# Patient Record
Sex: Female | Born: 1985 | Race: Black or African American | Hispanic: No | Marital: Single | Smoking: Never smoker
Health system: Southern US, Community
[De-identification: ages and names within clinical notes are randomized; demographics above are authoritative.]

## PROBLEM LIST (undated history)

## (undated) ENCOUNTER — Inpatient Hospital Stay (HOSPITAL_COMMUNITY): Payer: Self-pay

## (undated) DIAGNOSIS — I10 Essential (primary) hypertension: Secondary | ICD-10-CM

## (undated) DIAGNOSIS — J189 Pneumonia, unspecified organism: Secondary | ICD-10-CM

## (undated) DIAGNOSIS — N186 End stage renal disease: Secondary | ICD-10-CM

## (undated) DIAGNOSIS — Z992 Dependence on renal dialysis: Secondary | ICD-10-CM

## (undated) DIAGNOSIS — Z940 Kidney transplant status: Secondary | ICD-10-CM

## (undated) DIAGNOSIS — K259 Gastric ulcer, unspecified as acute or chronic, without hemorrhage or perforation: Secondary | ICD-10-CM

## (undated) DIAGNOSIS — N189 Chronic kidney disease, unspecified: Secondary | ICD-10-CM

## (undated) HISTORY — DX: Pneumonia, unspecified organism: J18.9

## (undated) HISTORY — DX: Gastric ulcer, unspecified as acute or chronic, without hemorrhage or perforation: K25.9

---
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
## 2006-08-21 DIAGNOSIS — R809 Proteinuria, unspecified: Secondary | ICD-10-CM | POA: Insufficient documentation

## 2007-08-25 ENCOUNTER — Inpatient Hospital Stay (HOSPITAL_COMMUNITY): Admission: EM | Admit: 2007-08-25 | Discharge: 2007-09-09 | Payer: Self-pay | Admitting: Emergency Medicine

## 2007-08-25 ENCOUNTER — Ambulatory Visit: Payer: Self-pay | Admitting: Cardiology

## 2007-08-25 ENCOUNTER — Ambulatory Visit: Payer: Self-pay | Admitting: Internal Medicine

## 2007-08-25 ENCOUNTER — Ambulatory Visit: Payer: Self-pay | Admitting: Oncology

## 2007-08-26 ENCOUNTER — Encounter (INDEPENDENT_AMBULATORY_CARE_PROVIDER_SITE_OTHER): Payer: Self-pay | Admitting: Internal Medicine

## 2007-08-31 ENCOUNTER — Encounter (INDEPENDENT_AMBULATORY_CARE_PROVIDER_SITE_OTHER): Payer: Self-pay | Admitting: Internal Medicine

## 2007-09-01 ENCOUNTER — Ambulatory Visit: Payer: Self-pay | Admitting: Vascular Surgery

## 2007-10-01 ENCOUNTER — Ambulatory Visit: Payer: Self-pay | Admitting: Infectious Disease

## 2007-10-01 ENCOUNTER — Encounter (INDEPENDENT_AMBULATORY_CARE_PROVIDER_SITE_OTHER): Payer: Self-pay | Admitting: Internal Medicine

## 2007-10-01 DIAGNOSIS — N186 End stage renal disease: Secondary | ICD-10-CM

## 2007-10-01 DIAGNOSIS — N189 Chronic kidney disease, unspecified: Secondary | ICD-10-CM

## 2007-10-01 DIAGNOSIS — R8789 Other abnormal findings in specimens from female genital organs: Secondary | ICD-10-CM | POA: Insufficient documentation

## 2007-10-01 DIAGNOSIS — D631 Anemia in chronic kidney disease: Secondary | ICD-10-CM

## 2007-10-01 DIAGNOSIS — D696 Thrombocytopenia, unspecified: Secondary | ICD-10-CM

## 2007-10-01 DIAGNOSIS — Z992 Dependence on renal dialysis: Secondary | ICD-10-CM

## 2007-10-01 LAB — CONVERTED CEMR LAB
BUN: 14 mg/dL (ref 6–23)
CO2: 31 meq/L (ref 19–32)
Calcium: 11.1 mg/dL — ABNORMAL HIGH (ref 8.4–10.5)
Chlamydia, DNA Probe: NEGATIVE
Chloride: 94 meq/L — ABNORMAL LOW (ref 96–112)
Creatinine, Ser: 5.86 mg/dL — ABNORMAL HIGH (ref 0.40–1.20)
GC Probe Amp, Genital: NEGATIVE
Glucose, Bld: 67 mg/dL — ABNORMAL LOW (ref 70–99)
HCT: 42.2 % (ref 36.0–46.0)
Hemoglobin: 13.7 g/dL (ref 12.0–15.0)
MCHC: 32.6 g/dL (ref 30.0–36.0)
MCV: 99 fL (ref 78.0–100.0)
Platelets: 273 10*3/uL (ref 150–400)
Potassium: 3.7 meq/L (ref 3.5–5.3)
RBC: 4.26 M/uL (ref 3.87–5.11)
RDW: 18.6 % — ABNORMAL HIGH (ref 11.5–15.5)
Sodium: 138 meq/L (ref 135–145)
WBC: 8.5 10*3/uL (ref 4.0–10.5)

## 2007-10-02 ENCOUNTER — Encounter (INDEPENDENT_AMBULATORY_CARE_PROVIDER_SITE_OTHER): Payer: Self-pay | Admitting: Internal Medicine

## 2007-10-05 LAB — CONVERTED CEMR LAB
Candida species: NEGATIVE
Gardnerella vaginalis: POSITIVE — AB
Trichomonal Vaginitis: NEGATIVE

## 2007-10-14 ENCOUNTER — Encounter (INDEPENDENT_AMBULATORY_CARE_PROVIDER_SITE_OTHER): Payer: Self-pay | Admitting: Internal Medicine

## 2007-11-19 ENCOUNTER — Ambulatory Visit: Payer: Self-pay | Admitting: *Deleted

## 2007-12-15 ENCOUNTER — Ambulatory Visit (HOSPITAL_COMMUNITY): Admission: RE | Admit: 2007-12-15 | Discharge: 2007-12-15 | Payer: Self-pay | Admitting: Nephrology

## 2008-07-07 ENCOUNTER — Ambulatory Visit (HOSPITAL_COMMUNITY): Admission: RE | Admit: 2008-07-07 | Discharge: 2008-07-07 | Payer: Self-pay | Admitting: Nephrology

## 2009-10-13 ENCOUNTER — Ambulatory Visit (HOSPITAL_COMMUNITY): Admission: RE | Admit: 2009-10-13 | Discharge: 2009-10-13 | Payer: Self-pay | Admitting: Nephrology

## 2009-11-22 ENCOUNTER — Ambulatory Visit: Payer: Self-pay | Admitting: Vascular Surgery

## 2009-11-30 ENCOUNTER — Ambulatory Visit (HOSPITAL_COMMUNITY): Admission: RE | Admit: 2009-11-30 | Discharge: 2009-11-30 | Payer: Self-pay | Admitting: Vascular Surgery

## 2009-11-30 ENCOUNTER — Ambulatory Visit: Payer: Self-pay | Admitting: Vascular Surgery

## 2009-12-12 ENCOUNTER — Ambulatory Visit: Payer: Self-pay | Admitting: Vascular Surgery

## 2010-01-18 ENCOUNTER — Emergency Department (HOSPITAL_COMMUNITY): Admission: EM | Admit: 2010-01-18 | Discharge: 2010-01-19 | Payer: Self-pay | Admitting: Emergency Medicine

## 2010-01-24 ENCOUNTER — Ambulatory Visit: Payer: Self-pay | Admitting: Vascular Surgery

## 2010-01-26 ENCOUNTER — Ambulatory Visit (HOSPITAL_COMMUNITY): Admission: RE | Admit: 2010-01-26 | Discharge: 2010-01-26 | Payer: Self-pay | Admitting: Nephrology

## 2010-01-30 ENCOUNTER — Ambulatory Visit: Payer: Self-pay | Admitting: Vascular Surgery

## 2010-01-30 ENCOUNTER — Ambulatory Visit (HOSPITAL_COMMUNITY): Admission: RE | Admit: 2010-01-30 | Discharge: 2010-01-30 | Payer: Self-pay | Admitting: Vascular Surgery

## 2010-02-15 ENCOUNTER — Ambulatory Visit: Payer: Self-pay | Admitting: Vascular Surgery

## 2010-03-13 ENCOUNTER — Emergency Department (HOSPITAL_COMMUNITY): Admission: EM | Admit: 2010-03-13 | Discharge: 2010-03-13 | Payer: Self-pay | Admitting: Emergency Medicine

## 2010-03-20 ENCOUNTER — Ambulatory Visit: Payer: Self-pay | Admitting: Vascular Surgery

## 2010-03-20 ENCOUNTER — Ambulatory Visit (HOSPITAL_COMMUNITY): Admission: RE | Admit: 2010-03-20 | Discharge: 2010-03-20 | Payer: Self-pay | Admitting: Vascular Surgery

## 2010-05-29 ENCOUNTER — Emergency Department (HOSPITAL_COMMUNITY)
Admission: EM | Admit: 2010-05-29 | Discharge: 2010-05-29 | Payer: Self-pay | Source: Home / Self Care | Admitting: Emergency Medicine

## 2010-06-21 ENCOUNTER — Emergency Department (HOSPITAL_COMMUNITY)
Admission: EM | Admit: 2010-06-21 | Discharge: 2010-06-21 | Payer: Self-pay | Source: Home / Self Care | Admitting: Emergency Medicine

## 2010-06-24 DIAGNOSIS — Z940 Kidney transplant status: Secondary | ICD-10-CM

## 2010-06-24 HISTORY — PX: KIDNEY TRANSPLANT: SHX239

## 2010-06-24 HISTORY — DX: Kidney transplant status: Z94.0

## 2010-07-02 ENCOUNTER — Emergency Department (HOSPITAL_COMMUNITY)
Admission: EM | Admit: 2010-07-02 | Discharge: 2010-07-02 | Payer: Self-pay | Source: Home / Self Care | Admitting: Family Medicine

## 2010-07-09 LAB — POCT PREGNANCY, URINE: Preg Test, Ur: NEGATIVE

## 2010-07-15 ENCOUNTER — Encounter: Payer: Self-pay | Admitting: Nephrology

## 2010-07-19 ENCOUNTER — Ambulatory Visit (HOSPITAL_COMMUNITY)
Admission: RE | Admit: 2010-07-19 | Discharge: 2010-07-19 | Payer: Self-pay | Source: Home / Self Care | Attending: Nephrology | Admitting: Nephrology

## 2010-08-02 ENCOUNTER — Ambulatory Visit (INDEPENDENT_AMBULATORY_CARE_PROVIDER_SITE_OTHER): Payer: Medicaid Other

## 2010-08-02 DIAGNOSIS — N186 End stage renal disease: Secondary | ICD-10-CM

## 2010-08-02 DIAGNOSIS — T82898A Other specified complication of vascular prosthetic devices, implants and grafts, initial encounter: Secondary | ICD-10-CM

## 2010-08-03 NOTE — H&P (Signed)
HISTORY AND PHYSICAL EXAMINATION  August 02, 2010  Re:  Cook, Kerry L                  DOB:  1985-08-01  CHIEF COMPLAINT:  Competing branch, left Cimino fistula.  HISTORY OF PRESENT ILLNESS:  Patient is a 25 year old woman who had a left radiocephalic fistula placed on 06/09/__________ by Dr. Oneida Alar. The have been using this fistula, but it was found that she had decreased flow.  She was sent for evaluation after a fistulogram.  The fistulogram, done on 07/19/2010 showed diminished flow in the fistula with a large competing branch at the wrist, and she was sent for evaluation of ligation of this branch.  PAST MEDICAL HISTORY:  Unchanged.  She has hypertension, end-stage renal disease, and thrombocytopenia.  SOCIAL HISTORY:  She is a Ship broker in early childhood education.  REVIEW OF SYSTEMS:  A 10-point review of systems was reviewed and was negative for chest pain, shortness of breath, numbness, tingling or pain in her left hand.  Positive for chronic kidney disease.  PHYSICAL EXAMINATION:  Vital Signs: She is 5 feet 2 and 118 pounds.  Her heart rate was 86, and her saturations were 100%.  Respiratory rate was 10.  Her lungs were clear without wheezes, rales or rhonchi.  Heart: Rate and rhythm was regular without murmur or rub.  HEENT:  Grossly within normal limits.  She had no ulcers or skin rashes.  Left upper extremity:  She had a large competing branch noted over the wrist with increased flow in her Cimino fistula when this branch was compressed, and this was confirmed with Doppler flow.  She has a 2+ radial pulse distal to the fistula.  Her hand was warm and pink.  She had good motion and sensation in the left hand.  MEDICATIONS:  Include PhosLo 667, two with meals and 1 with snack, lisinopril 10 mg at bedtime, Renagel 800 mg 2 tablets t.i.d. with meals, Norvasc 10 mg at bedtime, Zocor 40 mg daily, and Nephro-Vite 1 tablet daily.  She dialyzes at  the [facility] on Monday, Wednesday, and Friday.  ASSESSMENT/PLAN:  Competing branch in the left wrist off the left Cimino arteriovenous fistula.  Plan is to do a ligation of the competing branch on 08/21/2010.  The patient consents and was given instructions regarding this outpatient procedure.  Kerry Kearns, PA-C  Charles E. Fields, MD Electronically Signed  RR/MEDQ  D:  08/02/2010  T:  08/02/2010  Job:  [id number]

## 2010-08-20 ENCOUNTER — Encounter (HOSPITAL_COMMUNITY)
Admission: RE | Admit: 2010-08-20 | Discharge: 2010-08-20 | Disposition: A | Discharge status: Home / Self Care | Payer: Medicare Other | Source: Ambulatory Visit | Attending: Vascular Surgery | Admitting: Vascular Surgery

## 2010-08-20 DIAGNOSIS — Z01812 Encounter for preprocedural laboratory examination: Secondary | ICD-10-CM | POA: Insufficient documentation

## 2010-08-20 LAB — SURGICAL PCR SCREEN
MRSA, PCR: NEGATIVE
Staphylococcus aureus: NEGATIVE

## 2010-08-20 LAB — POCT I-STAT 4, (NA,K, GLUC, HGB,HCT)
Glucose, Bld: 76 mg/dL (ref 70–99)
HCT: 33 % — ABNORMAL LOW (ref 36.0–46.0)
Hemoglobin: 11.2 g/dL — ABNORMAL LOW (ref 12.0–15.0)
Potassium: 3.7 mEq/L (ref 3.5–5.1)
Sodium: 140 mEq/L (ref 135–145)

## 2010-08-21 ENCOUNTER — Ambulatory Visit (HOSPITAL_COMMUNITY)
Admission: RE | Admit: 2010-08-21 | Discharge: 2010-08-21 | Disposition: A | Discharge status: Home / Self Care | Payer: Medicare Other | Source: Ambulatory Visit | Attending: Vascular Surgery | Admitting: Vascular Surgery

## 2010-08-21 DIAGNOSIS — I120 Hypertensive chronic kidney disease with stage 5 chronic kidney disease or end stage renal disease: Secondary | ICD-10-CM | POA: Insufficient documentation

## 2010-08-21 DIAGNOSIS — N186 End stage renal disease: Secondary | ICD-10-CM

## 2010-08-21 DIAGNOSIS — T82598A Other mechanical complication of other cardiac and vascular devices and implants, initial encounter: Secondary | ICD-10-CM | POA: Insufficient documentation

## 2010-08-21 DIAGNOSIS — Z992 Dependence on renal dialysis: Secondary | ICD-10-CM | POA: Insufficient documentation

## 2010-08-21 DIAGNOSIS — T82898A Other specified complication of vascular prosthetic devices, implants and grafts, initial encounter: Secondary | ICD-10-CM

## 2010-08-21 DIAGNOSIS — Z01812 Encounter for preprocedural laboratory examination: Secondary | ICD-10-CM | POA: Insufficient documentation

## 2010-08-21 DIAGNOSIS — Y832 Surgical operation with anastomosis, bypass or graft as the cause of abnormal reaction of the patient, or of later complication, without mention of misadventure at the time of the procedure: Secondary | ICD-10-CM | POA: Insufficient documentation

## 2010-08-27 NOTE — Op Note (Signed)
  NAME:  Mikkelsen, Anaisha            ACCOUNT NO.:  [id number]  MEDICAL RECORD NO.:  [id number]           PATIENT TYPE:  O  LOCATION:  SDSC                         FACILITY:  [facility]  PHYSICIAN:  Charles E. Fields, MD  DATE OF BIRTH:  1985/09/26  DATE OF PROCEDURE:  08/21/2010 DATE OF DISCHARGE:  08/21/2010                              OPERATIVE REPORT   PROCEDURE:  Ligation of multiple side branches, left arm arteriovenous fistula.  PREOPERATIVE DIAGNOSIS:  Non-maturing arteriovenous fistula, left arm.  POSTOPERATIVE DIAGNOSIS:  Non-maturing arteriovenous fistula, left arm.  ANESTHESIA:  Local with IV sedation.  ASSISTANT:  Nurse.  OPERATIVE FINDINGS:  Side branches ligated x2.  OPERATIVE DETAILS:  After obtaining informed consent, the patient was taken to the operating room.  The patient was placed in supine position on the operating table.  After adequate sedation, the patient's entire left upper extremity was prepped and draped in usual sterile fashion. Local anesthesia was infiltrated over a very large proximal side branch off of the left radiocephalic AV fistula.  The incision was made approximately 2 cm higher than the original incision for the fistula. Incision was carried down through the subcutaneous tissues down to level of a side branch, which was approximately 1.5 mm in diameter.  This was dissected free circumferentially and then ligated with 2-0 silk ties. There was an additional side branch seen just above this on the skin surface.  Therefore, local anesthesia was also infiltrated over this, and an additional longitudinal incision was made over the superficial vein approximately 2 cm higher than the first incision.  Incision was carried down through the subcutaneous tissues down to level of an additional side branch.  This was also about 1.25-1.5 mm in diameter. This was dissected free circumferentially and ligated with 2-0 silk ties.  There was still an easily  palpable thrill in the fistula at this point.  Both wounds were irrigated with normal saline solution.  The skin edges were then reapproximated of both incisions using a 4-0 Vicryl subcuticular stitch and Dermabond applied to the incisions.  The patient tolerated the procedure well and there were no complications. Instrument, sponge, and needle counts were correct at the end of the case.  The patient was taken to recovery room in stable condition.     Jessy Oto. Fields, MD     CEF/MEDQ  D:  08/21/2010  T:  08/22/2010  Job:  [id number]  Electronically Signed by Ruta Hinds MD on 08/27/2010 11:02:43 AM

## 2010-09-04 LAB — URINE CULTURE: Culture  Setup Time: 201112061936

## 2010-09-04 LAB — URINALYSIS, ROUTINE W REFLEX MICROSCOPIC
Bilirubin Urine: NEGATIVE
Glucose, UA: 100 mg/dL — AB
Ketones, ur: NEGATIVE mg/dL
Leukocytes, UA: NEGATIVE
Nitrite: NEGATIVE
Protein, ur: 100 mg/dL — AB
Specific Gravity, Urine: 1.015 (ref 1.005–1.030)
Urobilinogen, UA: 0.2 mg/dL (ref 0.0–1.0)
pH: 8.5 — ABNORMAL HIGH (ref 5.0–8.0)

## 2010-09-04 LAB — URINE MICROSCOPIC-ADD ON

## 2010-09-04 LAB — HEMOCCULT GUIAC POC 1CARD (OFFICE): Fecal Occult Bld: NEGATIVE

## 2010-09-04 LAB — POCT I-STAT, CHEM 8
BUN: 23 mg/dL (ref 6–23)
Calcium, Ion: 1.08 mmol/L — ABNORMAL LOW (ref 1.12–1.32)
Chloride: 96 mEq/L (ref 96–112)
Glucose, Bld: 84 mg/dL (ref 70–99)

## 2010-09-04 LAB — MAGNESIUM: Magnesium: 3.2 mg/dL — ABNORMAL HIGH (ref 1.5–2.5)

## 2010-09-07 LAB — POCT I-STAT 4, (NA,K, GLUC, HGB,HCT)
Glucose, Bld: 88 mg/dL (ref 70–99)
Potassium: 3.7 mEq/L (ref 3.5–5.1)

## 2010-09-07 LAB — SURGICAL PCR SCREEN: Staphylococcus aureus: NEGATIVE

## 2010-09-10 LAB — POCT I-STAT 4, (NA,K, GLUC, HGB,HCT): Glucose, Bld: 85 mg/dL (ref 70–99)

## 2010-09-11 ENCOUNTER — Ambulatory Visit (INDEPENDENT_AMBULATORY_CARE_PROVIDER_SITE_OTHER): Payer: Medicare Other | Admitting: Vascular Surgery

## 2010-09-11 DIAGNOSIS — N186 End stage renal disease: Secondary | ICD-10-CM

## 2010-09-11 LAB — POTASSIUM: Potassium: 4.3 mEq/L (ref 3.5–5.1)

## 2010-09-12 NOTE — Assessment & Plan Note (Signed)
OFFICE VISIT  Cook, Kerry L DOB:  23-Jul-1985                                       09/11/2010 [id number]  Patient has a functioning left forearm AV fistula (Cimino) which was revised by ligation of the 3 competing branches by Dr. Oneida Alar recently. This was performed on February 28.  She states that she occasionally has a sharp pain that shoots down into the left hand.  She denies numbness in the hand or weakness in the hand.  PHYSICAL EXAMINATION:  On examination, the fistula is functioning nicely, and the incisions are healing well.  A few Vicryl suture knots were excised, and there is an excellent pulse and palpable thrill in the fistula with no evidence of steal or ischemia of the left hand.  She was reassured regarding these findings and will come to see [place] on a p.r.n. basis.    Kerry Cook, M.D. Electronically Signed  JDL/MEDQ  D:  09/11/2010  T:  09/12/2010  Job:  [id number]

## 2010-11-06 NOTE — Assessment & Plan Note (Signed)
OFFICE VISIT   Cook, Kerry L  DOB:  Feb 06, 1986                                       12/12/2009  [id number]   CHIEF COMPLAINT:  Follow-up left Cimino AV fistula.   Patient is a 25 year old woman who has been on hemodialysis and had a  left Cimino fistula placed on 11/30/2009 by Dr. Oneida Alar.  She has been  doing well.  She has had no signs of steal, no numbness or tingling in  her hand.  No pain in the hand.  She is able to use the hand normally.  She is dialyzing through a right IJ perm cath.   PHYSICAL EXAMINATION:  This is a thin, well-developed, well-nourished  young woman in no acute distress.  Vital signs:  Heart rate 72,  saturations are 100%.  Respiratory rate is 10.  The left arm Cimino  fistula, the wound is healing well.  There was some protuberant suture  which was clipped at the proximal end of the wound, otherwise was  healing well.  She had a positive radial pulse.  Her hand was warm and  pink with good motion and sensation.  She had a good thrill and bruit in  the AV fistula, and the vein was easily palpable in the forearm.   ASSESSMENT:  Maturing, easily palpable left Cimino fistula.   PLAN:  Follow up in 6 weeks with Dr. Oneida Alar to check maturation of the  fistula.   Wray Kearns, PA-C   Kerry Cook. Kellie Simmering, M.D.  Electronically Signed   RR/MEDQ  D:  12/12/2009  T:  12/12/2009  Job:  [id number]

## 2010-11-06 NOTE — Assessment & Plan Note (Signed)
OFFICE VISIT   Gentles, Tarrie L  DOB:  09-30-1985                                       02/15/2010  [id number]   The patient returns for followup today.  She had excision of an  aneurysmal degenerated thrombosed nonfunctional AV fistula in her right  arm.   On exam today blood pressure is 129/84 in the left arm, heart rate is 98  and regular.  Temperature is 97.8.  She has an easily palpable thrill  and audible bruit in the left radiocephalic AV fistula.  Right upper  extremity arm incisions are all well-healed.  She has a 2+ radial pulse  on the right side.   She will follow up on an as-needed basis if she needs any further  maintenance work on her left arm AV fistula.     Jessy Oto. Fields, MD  Electronically Signed   CEF/MEDQ  D:  02/15/2010  T:  02/16/2010  Job:  [id number]

## 2010-11-06 NOTE — Discharge Summary (Signed)
NAME:  Kerry Cook, Kerry Cook            ACCOUNT NO.:  [id number]   MEDICAL RECORD NO.:  [id number]          PATIENT TYPE:  INP   LOCATION:  [id number]                         FACILITY:  [facility]   PHYSICIAN:  Kerry Cook, M.D.  DATE OF BIRTH:  1985-07-21   DATE OF ADMISSION:  08/25/2007  DATE OF DISCHARGE:  09/09/2007                               DISCHARGE SUMMARY   DISCHARGE DIAGNOSES:  1. Hypertensive emergency.  2. Endstage renal disease, FSGN, collapsing variant.  3. 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  4. Thrombocytopenia.  5. Anemia.  6. Proteinuria.  7. History of medical noncompliance with medications, secondary to      financial issues.  8. Metabolic acidosis.   DISCHARGE MEDICATIONS:  1. Labetalol 200 mg 1 tablet by mouth twice a day..  2. Norvasc 10 mg 1 tablet by mouth daily.  3. Aspirin 81 mg 1 tablet by mouth daily.  4. Nephro-Vite 1 tablet by mouth daily.  5. Calcium carbonate 500 mg 2 tablets by mouth 3 times daily with      food.  6. Hectorol 4 mcg IV 3 times per week with dialysis.  7. Epogen 5000 units 3 times per week with dialysis.   DISPOSITION AND FOLLOWUP:  The patient was discharged in a stable  condition with followup in the outpatient clinic with Kerry Cook on October 01, 2007, at 1:30 p.m.  During this appointment, it is  going to be important to recheck a renal function panel and also to  check specifically the blood pressure just to be sure that the patient  is compliant with her medications and that the blood pressure is very  well controlled.  The patient was instructed to bring her medications  bottle, so will be worth it  for the doctor to review the medication  bottles for accuracy regarding doses and meds.  The patient was also  instructed to follow a renal diet, and she is going to receive  hemodialysis as an outpatient on days; Mondays, Wednesdays, and Fridays  at [facility].   CONSULTATIONS MADE DURING  THIS HOSPITALIZATION:  Hematology/oncologist  and Nephrology were consulted   PROCEDURES PERFORMED DURING THIS ADMISSION:  The patient had an AV  fistula created on the right upper arm by Kerry Cook.  She had a CT  of the head without contrast that also demonstrated no abnormality  without any evidence of acute intracranial hemorrhage, infarct, mass  effect, midline shift, or abnormal extra-axial fluid collection.  There  was no hydrocephalus.  The patient had a renal ultrasound that shows  markedly echogenic kidneys bilaterally with no corticomedullary  differentiation.  Findings are consistent with renal medical disease  that those appeared to be some excretion of urine as indicated by the  urethal junction in the bladder.  The patient also had an ultrasound-  guided right IJ tunnel for hemodialysis catheter insertion with  demonstrated fluoroscopically right IJ tunnel hemodialysis catheter  without the tips in the right subclavian vein in the SVC/RA junction  ready for use, 19-cm tip to cuff HemoSplit catheter in place.  The  patient also had another chest x-ray on September 05, 2007, showing mild  cardiomegaly with a stable right IJ dialysis catheter.  Lungs clear, no  effusion.  A 2-D echo demonstrated left ventricular hypertrophy with EF  of 65%.2-D echo that demonstrated left ventricular hypertrophy wall  thickening, mildly increased demonstrating left ventricular hypertrophy.  There was no left ventricular regional wall motion abnormality, ejection  fraction estimated to be 65%.  There was mild mitral valvular  regurgitation.  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   HISTORY OF PRESENT ILLNESS, PHYSICAL EXAMINATION, AND LABS AT THE MOMENT  OF ADMISSION:  Kerry Cook is a 25 year old African American woman with  past medical history of  FSGN and hypertension diagnosed 6 years ago, who  came to the emergency department with blurred vision.  The patient  reported that she had been without medications for her blood pressure  for the last 6 months.  She denies headache, chest pain, nausea,  vomiting, or abdominal pain.  The patient is not allergic to any drug  and found to have a past medical history of hypertension plus renal  failure, secondary to focal segmental glomerulonephrosis.   PHYSICAL EXAMINATION:  VITAL SIGNS:  At the moment of admission,  temperature was 97.8, blood pressure was 294/174, heart rate was 103,  respiratory rate 18, and oxygen saturation 97% on room air.  GENERAL:  The patient was alert, in no acute distress, lying on bed.  EYES:  Anicteric.  PERRLA.  Extraocular muscles intact.  ENT:  Pink pharynx.  No exudates.  No plaques.  Good dentition.  NECK:  No bruits.  No thyromegaly.  RESPIRATORY SYSTEM:  Clear to auscultation bilaterally.  CARDIOVASCULAR:  Tachycardia of regular rhythm.  No murmurs, gallops, or  rub.  GI:  Soft, nontender, and nondistended.  Positive bowel sounds.  EXTREMITIES:  No edema.  No cyanosis.  No clubbing.  SKIN:  Mild dryness present but no lesions or rash.  There was no  lymphadenopathy.  NEUROLOGIC:  The patient was alert, awake, and oriented x3.  Complaining  of blurred vision, improved after blood pressure decreased with  medications the patient received Klonopin by mouth at emergency  department, and then she received 20 mg IV of labetalol.  Intracranial  nerves II through XII.  Appropriate affect.   LABORATORY DATA:  At admission, labs demonstrated a sodium of 133,  potassium 4.4, chloride 106, bicarbonate 18.8 with anion gap of 8, BUN  58, creatinine 12.1, glucose 85, bilirubin 0.8, alkaline phosphatase 55,  AST 26, ALT 14, protein 6.6, albumin 3.3, and calcium 9.0.  White blood  cell 7.8 with an ANC of 6.1, hemoglobin 7.1 with a hematocrit of 21,  platelets 60  with an MCV of 94.  CT of the head no acute abnormality.  The urinalysis was cloudy-appearing with a large amount of hemoglobin  and more than 300 protein, many bacteria, red blood cells 0 to 2 per  field.  Chest x-ray read no acute cardiopulmonary disease present.  Cardiac enzymes:  Total CK 244, troponin 0.16, and CK-MB 2.6.   HOSPITAL COURSE BY PROBLEMS:  1. Hypertensive emergency secondary to medical noncopliance with      target organ damage demonstrated by kidneys with proteinuria,      hematuria, and the central nervous system with presentation of      blurred vision.  The patient was admitted to telemetry secondary to      tachycardia plus PVC's . IV Labetolol was started for blood      pressure control.  This was eventually changed to po and Norvasc      was also added.  Blood pressure was controlled on this medicine      regimen as well as dialysis. The patient is going to follow up in      the outpatient clinic on October 01, 2007 for blood pressure check. 2D      ECHO was performed with results as noted above.  2. Anemia, multifactorial. On admission, the patient's LDH was      elevated at 460 and the haptoglobin was low, indicating hemolysis      though there were no schistocytes on the peripheral smear. The      morning after admission, the hemoglobin dropped to 6.4 and the      patient was transfused 2 units of blood.  Hematology was consulted.      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  The patient was therefore given a trial of Decadron 10 mg      IV daily with daily followup of CBC and LDH.  On the steriods and      with control of her blood pressure, the patient's LDH normalized      and her hemoglobin remained stable around 9.0,  even after steroids      were tapered.  Hemoglobin at discharge was 9.2  and LDH was normal      at 213. Since ESRD was also contributing to the patient's anemia,      she was started on iron and Aranesp.  3. Thrombocytopenia. As above, hematology felt this was most likely      due to malignant hypertension vs a microangiopathic process.      Platelets normalized with control of blood pressure and steroids      and remained in the normal range with tapering of the steroids.      Platelet count at discharge was 203,000.  4. Renal insufficiency, secondary to focal segmental      glomerulonephrosis.  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 The patient was      prepared for hemodialysis including education.  The patient      received a AV fistula on the right upper arm, and she also had a      catheter placed in the right subclavian vein in junction with the      right carotid artery.  The patient was started on hemodialysis.      She responded well  without any complications.  At discharge,      creatinine was 6.9 with a BUN of 56.  The patient was scheduled to      have hemodialysis as an outpatient on Monday, Wednesday, and      Friday. She was discharged with a followup in the outpatient clinic      on October 01, 2007, and Monday, Wednesday, and Friday hemodialysis.      The facility where she is going to receive the hemodialysis is      [facility].  5. Metabolic acidosis with anion gap, secondary to renal failure.      Corrected with dialysis  6. Leukocytosis, most likely secondary to the use of steroids.  As      soon as we started tapering down the doses of the steroids, the      patient was having a resolution of this leukocytosis.  We will      continue checking the CBC, but there was a complete workup done in      order to rule out any infection, and everything was negative.      Clear chest x-ray.  The urinalysis was also negative for infection,      and blood culture x2 that was  also negative.  The patient was      without fever and completely asymptomatic.  7. Constipation.  She developed constipation after receiving morphine      in order to calm the pain after she had the AV fistula  placed on      her right upper arm and the catheter on her subclavian vein.  She      received Colace and MiraLax, and this problem was resolved.  8. Secondary hyperparathyroidism, secondary to renal failure.  She was      using Hectorol, and she was also using calcium carbonate in order      to help with this problem.  At discharge, the patient's temperature was 98.0, blood pressure was  140/80, heart rate 63, respiratory rate 20 with 99% oxygen saturation on  room air.   Sodium of 133, potassium 4.3, chloride 101, bicarbonate 23, BUN 56,  creatinine 6.9, and glucose 81.  We have a hemoglobin of 9.2, platelets  203, MCV 97.7, MVH 213 with white blood cells of 17.5.      Julieta Bellini, MD  Electronically Signed      Kerry Cook, M.D.  Electronically Signed    CEM/MEDQ  D:  09/30/2007  T:  09/30/2007  Job:  [id number]   cc:   Philemon Cook, M.D.

## 2010-11-06 NOTE — Consult Note (Signed)
NAME:  Kerry Cook, Kerry Cook            ACCOUNT NO.:  [id number]   MEDICAL RECORD NO.:  [id number]          PATIENT TYPE:  INP   LOCATION:  [id number]                         FACILITY:  [facility]   PHYSICIAN:  Donato Heinz, M.D.DATE OF BIRTH:  [birth year]   DATE OF CONSULTATION:  DATE OF DISCHARGE:                                 CONSULTATION   REASON FOR CONSULTATION:  Markedly elevated serum creatinine on  admission.   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  The patient endorses a  diagnosis of hypertension since approximately age 78 with sporadic  followup most recently with a nephrologist at [facility], Dr. Dwana Melena.  The patient reports that her last  visit with Dr. Thornell Sartorius was approximately one year prior to admission and  that her last dose of antihypertensive medications was taken about six  months prior to admission.  This is reportedly due to financial  difficulties in purchasing these medications.  Per old records, the  patient's creatinine was approximately 1.9 in 2007, increased to 2.3 in  2008, and has not been checked since then, given her lack of visit to a  nephrologist in one year.  Of note, the patient denies any other  constitutional symptoms, including nausea, vomiting, chest pain,  abdominal pain, diarrhea, fevers or chills.  She does endorse occasional  headaches, which she has had for at least five years, and intermittent  blurry vision, worse in the several days prior to admission.  Regarding  her emergency department  course, the patient was initially noted to have  a blood pressure of 260/164 with a creatinine of 12.1.  She was thus  treated for malignant hypertension and admitted for workup of her renal  failure.  We are consulted given her history of renal disease and her  markedly elevated creatinine on admission.   ADMISSION MEDICATIONS:  Patient denies taking any medications at home.  In the past, she has been prescribed medications for blood pressure,  including 25 mg daily of hydrochlorothiazide, 100 mg twice daily of  labetalol, and 20 mg daily of enalapril, but again has not taken these  medications for at least six months.   CURRENT MEDICATIONS:  As of the time of this dictation include:  1. Norvasc 10 mg by mouth daily.  2. Aspirin 81 mg by mouth daily.  3. Os-Cal 500 mg by mouth twice daily.  4. Labetalol 200 mg by mouth twice daily.  5. Protonix 40 mg by mouth daily.  6. Nephro-Vite 1 tablet by mouth daily.  7. Tylenol 650 mg by mouth every 4 hours as needed.   PHYSICAL EXAMINATION:  VITAL SIGNS:  Temperature 98.1 degrees  Fahrenheit, blood pressure 127/77, pulse 80, respiration rate 20, oxygen  saturation 97% on room air.  GENERAL:  Awake, alert and oriented x3 in no acute distress.  A young  African-American female.  HEENT:  Pupils are equal, round and reactive to light and accommodation.  Extraocular movements are intact.  Sclerae are anicteric.  Moist mucous  membranes.  Fair dentition.  NECK:  Supple without JVD or carotid bruits.  CARDIOVASCULAR:  Regular rate and rhythm without murmurs, rubs or  gallops.  PULMONARY:  Clear to auscultation bilaterally without wheezes, rales or  rhonchi.  ABDOMEN:  Soft, nontender, nondistended with normoactive bowel sounds.  GENITOURINARY:  Bilateral mild-to-moderate CVA tenderness.  EXTREMITIES:  No clubbing, cyanosis or edema.   LABORATORY STUDIES:  Recent studies remarkable for sodium 134, potassium  3.7, chloride 102, bicarbonate 22,  BUN 68, creatinine 11.84, glucose 86,  calcium 8.8, albumin 2.8, phosphorus 8.2.  Magnesium is normal at 2.4.  White blood cell count 7.2, hemoglobin 9.1, hematocrit 26.2, platelet  count 75, MCV 92.9, RDW 14.  A urine pregnancy test has been done and is  negative.  A screen for drugs of abuse was negative.  LDH is high at  460, and haptoglobin is low at less than 6.  C3 and C4 complement levels  are normal.  HIV antibody negative.  Antinuclear antibody negative.  ANCA pending.  Acute hepatitis panel pending.  Anti-parathyroid hormone  level pending.  Urinalysis on August 26, 2007 revealed specific gravity  1.018 with a pH of 6, greater than 300 protein, otherwise negative.  Urine microscopy at that time revealed a few squamous epithelial cells,  3-6 white blood cells per high-powered field, 3-6 red blood cells per  high-powered field, a few bacteria, and amorphous urates.  Microalbumin  and creatinine ratio markedly elevated at 2247.3 mg/gm.   IMPRESSION:  1. Acute-on-chronic renal failure in the setting of known renal      disease (collapsing variant focal segmental glomerulosclerosis).  2. Malignant hypertension.  3. Anemia.  4. Thrombocytopenia.  5. Probable secondary hyperparathyroidism.  6. Need for vascular access.   RECOMMENDATIONS:  1. Worsening renal failure, acute-on-chronic.  Past biopsy in 2004      with documented collapsing variant FSGS.  At this time, would      recommend beginning the workup for hemodialysis, including vein      mapping, but no indication for urgent hemodialysis is present at      this time.  Will go ahead and prepare the patient for hemodialysis      with education and begin workup for vascular access.  2. Given the patient's concomitant anemia and thrombocytopenia,      hematology/oncology has also been asked to evaluate the patient.      They have recommended a trial of steroids.  Assuming this is      possibly some HUS/TTP variant or an autoimmune  process, we will      follow along as the patient receives prednisone and monitor for any      change in renal function as a result.  However, note that such      significant disease as this patient has manifested is unlikely to      be impacted by corticosteroids; thus, the patient will likely      require hemodialysis.  3. The patient has already demonstrated a marked improvement in blood      pressure with the addition of several medications.  Agree with the      current plan and the use of Norvasc  and labetalol.  4. Workup so far reveals some level of hemolysis but a negative DAT.      The cause of the patient's anemia is unclear but is likely to be      contributed to by significant renal disease and erythropoietin      deficiency.  We will follow along as hematology makes      recommendations for treatment of the patient's anemia and start      Aranesp when appropriate.  5. Workup for thrombocytopenia will be deferred to hematology.  Note      that this is unlikely to be caused by renal disease, per say,      unless this is a manifestation of lupus nephritis or a similar      autoimmune process.  6. Intact PTH level is pending.  Will begin phosphate binders and/or      vitamin D supplementation in the form of calcitriol or Zemplar as      needed.  7. Will go ahead and order vein mapping for arteriovenous fistula      placement, given the need for hemodialysis.    We will follow the patient with you.  Thank you for this consult.      Dorina Hoyer, MD  Electronically Signed     ______________________________  Donato Heinz, M.D.    JH/MEDQ  D:  08/27/2007  T:  08/27/2007  Job:  [id number]

## 2010-11-06 NOTE — Assessment & Plan Note (Signed)
OFFICE VISIT   Cook, Kerry L  DOB:  1986-04-04                                       11/22/2009  [id number]   CHIEF COMPLAINT:  Needs new access.   HISTORY OF PRESENT ILLNESS:  The patient is a 25 year old female with  end-stage renal disease who currently dialyzes on Monday, Wednesday,  Friday.  She had a right brachiocephalic AV fistula which lasted 2 years  and is now occluded.  She recently had a right internal jugular vein  dialysis catheter placed in radiology.  Renal failure is thought to be  secondary to hypertension.   FAMILY HISTORY:  Unremarkable.   SOCIAL HISTORY:  She is single.  She does not smoke or consume alcohol.   REVIEW OF SYSTEMS:  A full 12-point review of systems was performed with  the patient today.  Please see intake referral form for details  regarding this.   MEDICATIONS:  Include Norvasc, labetalol and PhosLo.   ALLERGIES:  SHE HAS NO KNOWN DRUG ALLERGIES.   PHYSICAL EXAM:  Blood pressure 103/69 in the left arm, temperature is  97.5, heart rate 111 and regular.  Upper extremities; she has an  aneurysmal degenerated right upper arm fistula which is pulsatile  proximally but no audible bruit or thrill.  The left upper extremity she  has 2+ brachial and radial pulse.  She has a visible cephalic vein at  the left wrist level with placement of tourniquet.  Chest:  Clear to  auscultation.  Cardiac exam is regular rate and rhythm without murmur.  Extremities have no significant edema.  Musculoskeletal:  Exam shows no  major joint deformities.  Skin has no ulcers or rashes.   She had a vein mapping ultrasound today which shows the cephalic vein is  between 28 and 38 mm in diameter in the forearm and 57-30 mm in diameter  in the left upper arm.  Basilic vein is between 27 and 55 mm.   I believe the best option for Kerry Cook at this point would be  placement of a left radiocephalic AV fistula.  If the vein or  artery are  too small we would consider placing a left brachiocephalic fistula at  same setting.  She is scheduled for this on November 30, 2009.  Risks,  benefits and possible complications, procedure details including but not  limited to bleeding, infection, ischemic steal, non maturation of  fistula were explained to the patient today.  She understands and agrees  to proceed.     Jessy Oto. Fields, MD  Electronically Signed   CEF/MEDQ  D:  11/22/2009  T:  11/23/2009  Job:  [id number]   cc:   Dr. Gerarda Gunther Kidney

## 2010-11-06 NOTE — Procedures (Signed)
CEPHALIC VEIN MAPPING   INDICATION:  Right clotted AV fistula, evaluate access for graft  placement.   HISTORY:   EXAM:   The right cephalic vein is not evaluated.   The left cephalic vein is compressible.   Diameter measurements range from 0.28 to 0.57 cm.   The left basilic vein is compressible.   Diameter measurements range from 0.27 cm to 0.55 cm.   See attached worksheet for all measurements.   IMPRESSION:  Patent's left cephalic and basilic veins are of acceptable  diameter for use as a dialysis access site.   ___________________________________________  Jessy Oto. Fields, MD   CB/MEDQ  D:  11/22/2009  T:  11/22/2009  Job:  [id number]

## 2010-11-06 NOTE — Consult Note (Signed)
NAME:  Kerry Cook, Kerry Cook            ACCOUNT NO.:  [id number]   MEDICAL RECORD NO.:  [id number]          PATIENT TYPE:  INP   LOCATION:  6735                         FACILITY:  [facility]   PHYSICIAN:  Firas N. Shadad        DATE OF BIRTH:  [birth year]   DATE OF CONSULTATION:  DATE OF DISCHARGE:                                 CONSULTATION   REASON FOR CONSULTATION:  Anemia and thrombocytopenia.   HISTORY OF PRESENT ILLNESS:  This is a pleasant 25 year old [place]  American female who is a native of [place] for the majority of her  life.  She is currently unemployed and lives with her uncle and has had  diagnosis with focal segmental glomerulonephrosis for the last 6 years.  Has  been followed apparently at [facility] with  a baseline creatinine in the 1 to 1.9 range, and apparently in the last  few months, more specifically 6 months, has not followed up, has not  seen any physicians, and has stopped taking her medications as she  reports she could not afford them anymore and presented to the emergency  department on August 25, 2007, with symptoms of headache and was found to  have out-of-control high blood pressure, which she knew was the problem  as she had had this problem before.  She was found to have systolics at  0000000 to A999333 with diastolics of A999333 to 123456.  She was in acute renal  failure with a creatinine of 11.0.  Also it was noted her hemoglobin was  7 and platelet count was 60,000.  Based on these findings, the patient  was hospitalized in the last few days under the care of Dr. Ermalinda Memos from  the teaching service as well as the renal medicine service.  Attempts to  control her blood pressure were  initiated with labetalol and  hydralazine and that was successful.  She was transfused 2 units of  packed red cells, and we are asked to comment about anemia and her  thrombocytopenia.  The rest of her laboratory data were as follows:  She  was noted to have fecal  occult blood negative.  Her DAT was negative.  Her LDH was elevated at 460.  Her lactic acid was normal at 1.3.  Her  anemia panel revealed that her reticulocyte count was inappropriately  normal at 2.1%.  Her iron was low at 50%.  Vitamin B12 was 459 and  ferritin at 407.  Her  HIV status was nonreactive.  Her C3 and C4 were  within normal range.  Her haptoglobin was less than 6.  As mentioned,  creatinine was 11.0 and increased to 11.24.  Her liver function tests  were normal with normal bilirubin.  Her tox screen was negative, and her  magnesium was 2.4.  The patient received 2 units of packed red cell  transfusion as mentioned with a bump appropriately to a hemoglobin of  9.1 and her platelet counts improved from 60,000 to 75,000.  Her ANA is  currently pending.  Clinically Kerry Cook really reports no symptoms  at this  point.  She had had some headaches associated with high blood  pressure.  That has resolved, and fairly asymptomatic at this point.  Does not report any bleeding.  Does not report any  hemoptysis.  Has not  reported any other constitutional symptoms. Does not report any  neurological symptoms such as aphasia.  Does not report any weakness or  any alteration in mental status.   REVIEW OF SYSTEMS:  Does not report any headaches, blurred vision,  double vision.  Does not report any motor or sensory neuropathy,  alteration in mental status, psychiatric issues, depression.  Does not  report any fevers, chills, night sweats, weight loss.  Does not report  any chest pain, orthopnea or PND.  Does not report any cough or  hemoptysis or hematemesis.  Does not report any nausea, vomiting,  abdominal pain, hematochezia, melena.  Does not report any genitourinary  complaints,  frequency, urgency or hesitancy.  The rest of the review of  systems unremarkable.   PAST MEDICAL HISTORY:  As mentioned, a history of focal segmental  glomerulonephrosis and hypertension, otherwise  unremarkable.   MEDICATIONS:  Currently she is on amlodipine, aspirin, calcium,  labetalol, Protonix, Nephrocaps and Tylenol.   ALLERGIES:  None.   SOCIAL HISTORY:  Denies any alcohol or tobacco abuse.  Lives with her  uncle.  Unemployed at this time.   PHYSICAL EXAMINATION:  GENERAL:  Alert, awake, pleasant female, appeared  in no active distress.  VITAL SIGNS:  Her blood pressure currently is 127/77.  It was as high as  166/125 at one point last night.  Pulse is 80, respirations 20,  temperature is 98.1.  Satting 97% on room air.  HEENT:  Head is normocephalic, atraumatic.  Pupils equal, round and  reactive to light.  Oral mucosa is moist and pink.  NECK:  Supple.  HEART:  Regular rate.  LUNGS:  Clear.  ABDOMEN:  Soft.  EXTREMITIES:  No edema.   LABORATORY DATA:  Was mentioned in the history of present illness in  detail.  Her most recent labs showed an ANA that was negative.  Her  hemoglobin is 9.1.  White cells 7.2, platelet count of 75.  Her  magnesium was 2.4.  Her most recent renal function showed a creatinine  of 11.84, potassium 3.7, and a phosphorus of 8.2.  Calcium was 8.8.   Her peripheral smear was reviewed by me personally today and showed  clear evidence of spherocytosis, very few schistocytes,  otherwise  unremarkable.  No evidence of polychromasia.  Normal white cells.   ASSESSMENT:  1. At this point, this is a 25 year old [place] American female with a      longstanding history of focal segmental glomerulonephrosis that      seems to be worsening at this time with worsening acute-on-chronic      renal failure and might require hemodialysis in the near future.  2. Anemia, multifactorial.  She has an element of a nonimmune      hemolysis by evidence by very mid schistocytes.  She also has      evidence of spherocytosis, which is quite unusual in the setting of      a negative DAT as well as a negative autoimmune panel.  3. Thrombocytopenia, unclear etiology.   Could be an element due to      malignant hypertension versus an microangiopathic process such as      HUS or TTP.  My recommendation would be at this point after  discussion with Dr. Ermalinda Memos, the primary attending physician, as      well as Dr. Marval Regal, renal services, a trial of hemodialysis and      controlling her renal function as well as control of her blood      pressure.  We also will attempt a trial of IV Decadron at 10 mg      daily to see if there is any element of an autoimmune process that      could be controlled.  I would recommend following her CBC and LDH      daily, and if her platelets do not improve and her LDH does not      improve and go down, then I would offer therapeutic plasma exchange      with her hemodialysis and again an attempt to treat a possible      HUS/TTP.  Again it could be a possibility that she developed a      malignant hypertension due to her worsening renal failure and      caused hemolysis at this point and platelet destruction and with      controlling her blood pressure and controlling her renal failure      that would improve.  Nonetheless, we will watch her very closely,      and we will initiate plasma exchange if these maneuvers do not      work.           ______________________________  Mathis Dad. [facility]  Electronically Signed     FNS/MEDQ  D:  08/27/2007  T:  08/27/2007  Job:  [id number]

## 2010-11-06 NOTE — Op Note (Signed)
NAME:  Kerry Cook, Kerry Cook            ACCOUNT NO.:  [id number]   MEDICAL RECORD NO.:  [id number]          PATIENT TYPE:  INP   LOCATION:  6735                         FACILITY:  [facility]   PHYSICIAN:  Rosetta Posner, M.D.    DATE OF BIRTH:  1985-12-09   DATE OF PROCEDURE:  09/03/2007  DATE OF DISCHARGE:                               OPERATIVE REPORT   PREOPERATIVE DIAGNOSIS:  End-stage renal disease.   POSTOPERATIVE DIAGNOSIS:  End-stage renal disease.   PROCEDURE:  Right upper arm AV fistula creation.   SURGEON:  Rosetta Posner, M.D.   ASSISTANT:  Chad Cordial, P.A.-C.   ANESTHESIA:  MAC.   COMPLICATIONS:  None.   DISPOSITION:  To the recovery room stable.   PROCEDURE IN DETAIL:  The patient was taken to the operating room and  placed in the supine position.  The area of the right arm was prepped  and draped in the usual sterile fashion.   Incision was made over the antecubital space and carried down to isolate  the brachial artery which was of good caliber.  The antecubital vein led  into the basilic and the cephalic vein.  The vein mapping had shown a  patent but small cephalic vein.  This was mobilized distally, and the  cephalic vein was ligated distally and divided.  This was gently dilated  and was of excellent caliber.  The antecubital vein was divided, and the  cephalic vein was mobilized to the level of the brachial artery.  The  brachial artery was occluded proximally and distally and was opened with  an 11 blade and extended longitudinally with Potts scissors.  The vein  was sewn end-to-side to the artery with a running 6-0 Prolene suture.  The clamps were removed.  An excellent thrill was noted.  The wound was  irrigated with saline.  Hemostasis with electrocautery. The wounds were  closed with 3-0 Vicryl in the subcutaneous and subcuticular tissue.  Benzoin and Steri-Strips were applied.      Rosetta Posner, M.D.  Electronically Signed     TFE/MEDQ  D:   09/03/2007  T:  09/04/2007  Job:  [id number]

## 2010-11-06 NOTE — Assessment & Plan Note (Signed)
OFFICE VISIT   Cook, Kerry L  DOB:  May 05, 1986                                       01/24/2010  [id number]   The patient returns for followup today.  She had a left radiocephalic  fistula placed in June of 2011.  She is currently using a right-sided  catheter for dialysis without difficulty.   On physical exam today blood pressure is 126/89 in the right arm, heart  rate is 109 and regular, respirations are 20.  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  The left radiocephalic AV  fistula is patent with an easily palpable thrill and the vein is  continuing to dilate nicely.  The right catheter site looked okay with  no evidence of tenderness or purulent drainage.  The patient requested  today to have the right arm AV fistula excised.  She occasionally has  some pain in this and would prefer to have the aneurysmal segments  removed.  We will schedule this for her on 01/30/2010.  Risks, benefits,  possible complications and procedure details were explained to the  patient today including the fact that she will still have some scar in  that area but we should be able to improve the pain and certainly remove  the bulges in the arm without difficulty.  Her fistula should be ready  for cannulation in 1 more month.     Jessy Oto. Fields, MD  Electronically Signed   CEF/MEDQ  D:  01/24/2010  T:  01/25/2010  Job:  [id number]   cc:   Donato Heinz, M.D.

## 2011-02-28 ENCOUNTER — Other Ambulatory Visit: Payer: Self-pay | Admitting: Obstetrics

## 2011-03-18 LAB — DIFFERENTIAL
Basophils Absolute: 0
Basophils Absolute: 0
Basophils Absolute: 0
Basophils Absolute: 0
Basophils Absolute: 0
Basophils Relative: 0
Basophils Relative: 0
Basophils Relative: 1
Eosinophils Absolute: 0
Eosinophils Absolute: 0
Eosinophils Absolute: 0
Eosinophils Absolute: 0
Eosinophils Absolute: 0.2
Eosinophils Absolute: 0
Eosinophils Absolute: 0
Eosinophils Relative: 0
Eosinophils Relative: 0
Eosinophils Relative: 0
Eosinophils Relative: 0
Eosinophils Relative: 2
Eosinophils Relative: 0
Eosinophils Relative: 0
Eosinophils Relative: 0
Lymphocytes Relative: 4 — ABNORMAL LOW
Lymphocytes Relative: 6 — ABNORMAL LOW
Lymphocytes Relative: 8 — ABNORMAL LOW
Lymphocytes Relative: 11 — ABNORMAL LOW
Lymphocytes Relative: 14
Lymphs Abs: 0.4 — ABNORMAL LOW
Lymphs Abs: 0.6 — ABNORMAL LOW
Lymphs Abs: 0.7
Lymphs Abs: 1.3
Lymphs Abs: 1.4
Lymphs Abs: 1.5
Lymphs Abs: 1.8
Lymphs Abs: 2.5
Lymphs Abs: 2.6
Lymphs Abs: 2.8
Monocytes Absolute: 0.3
Monocytes Absolute: 0.4
Monocytes Absolute: 0.9
Monocytes Absolute: 1.1 — ABNORMAL HIGH
Monocytes Absolute: 1.5 — ABNORMAL HIGH
Monocytes Absolute: 1.4 — ABNORMAL HIGH
Monocytes Absolute: 1.4 — ABNORMAL HIGH
Monocytes Relative: 3
Monocytes Relative: 5
Monocytes Relative: 6
Monocytes Relative: 7
Monocytes Relative: 8
Monocytes Relative: 8
Neutro Abs: 10.4 — ABNORMAL HIGH
Neutro Abs: 13.1 — ABNORMAL HIGH
Neutro Abs: 15.6 — ABNORMAL HIGH
Neutro Abs: 6.1
Neutro Abs: 9 — ABNORMAL HIGH
Neutro Abs: 13.6 — ABNORMAL HIGH
Neutro Abs: 17.8 — ABNORMAL HIGH
Neutrophils Relative %: 91 — ABNORMAL HIGH
Neutrophils Relative %: 92 — ABNORMAL HIGH
Neutrophils Relative %: 95 — ABNORMAL HIGH

## 2011-03-18 LAB — TSH: TSH: 2.587

## 2011-03-18 LAB — BLOOD GAS, ARTERIAL
Bicarbonate: 19.4 — ABNORMAL LOW
FIO2: 0.21
Patient temperature: 98.6
pCO2 arterial: 32.6 — ABNORMAL LOW
pH, Arterial: 7.393

## 2011-03-18 LAB — CBC
HCT: 20.6 — ABNORMAL LOW
HCT: 23.8 — ABNORMAL LOW
HCT: 26 — ABNORMAL LOW
HCT: 26.8 — ABNORMAL LOW
HCT: 26.9 — ABNORMAL LOW
HCT: 26.9 — ABNORMAL LOW
HCT: 27.6 — ABNORMAL LOW
HCT: 27.6 — ABNORMAL LOW
HCT: 26.7 — ABNORMAL LOW
HCT: 27 — ABNORMAL LOW
HCT: 27.2 — ABNORMAL LOW
HCT: 28.2 — ABNORMAL LOW
HCT: 28.6 — ABNORMAL LOW
Hemoglobin: 10 — ABNORMAL LOW
Hemoglobin: 7.1 — CL
Hemoglobin: 8.2 — ABNORMAL LOW
Hemoglobin: 8.8 — ABNORMAL LOW
Hemoglobin: 9.1 — ABNORMAL LOW
Hemoglobin: 9.2 — ABNORMAL LOW
Hemoglobin: 9.2 — ABNORMAL LOW
Hemoglobin: 9.3 — ABNORMAL LOW
Hemoglobin: 9.3 — ABNORMAL LOW
Hemoglobin: 9.6 — ABNORMAL LOW
Hemoglobin: 8.9 — ABNORMAL LOW
Hemoglobin: 9.2 — ABNORMAL LOW
Hemoglobin: 9.3 — ABNORMAL LOW
Hemoglobin: 9.7 — ABNORMAL LOW
MCHC: 34.3
MCHC: 34.3
MCHC: 34.4
MCHC: 34.7
MCHC: 34.7
MCHC: 34.8
MCHC: 34.9
MCHC: 35.1
MCHC: 33.3
MCHC: 33.5
MCHC: 34
MCHC: 34.1
MCV: 93.5
MCV: 94.4
MCV: 94.4
MCV: 94.4
MCV: 94.8
MCV: 95.5
MCV: 95.7
MCV: 96.1
MCV: 97.6
MCV: 97.7
MCV: 98.5
Platelets: 106 — ABNORMAL LOW
Platelets: 121 — ABNORMAL LOW
Platelets: 145 — ABNORMAL LOW
Platelets: 159
Platelets: 220
Platelets: 252
Platelets: 219
Platelets: 265
RBC: 2.2 — ABNORMAL LOW
RBC: 2.76 — ABNORMAL LOW
RBC: 2.78 — ABNORMAL LOW
RBC: 2.82 — ABNORMAL LOW
RBC: 2.82 — ABNORMAL LOW
RBC: 2.82 — ABNORMAL LOW
RBC: 3.12 — ABNORMAL LOW
RBC: 2.74 — ABNORMAL LOW
RBC: 2.79 — ABNORMAL LOW
RBC: 2.93 — ABNORMAL LOW
RDW: 14
RDW: 14.1
RDW: 14.2
RDW: 14.3
RDW: 14.4
RDW: 14.4
RDW: 14.6
RDW: 14.8
RDW: 14.8
RDW: 14.8
RDW: 14.8
RDW: 15.2
WBC: 11.3 — ABNORMAL HIGH
WBC: 11.8 — ABNORMAL HIGH
WBC: 12.4 — ABNORMAL HIGH
WBC: 12.5 — ABNORMAL HIGH
WBC: 13.2 — ABNORMAL HIGH
WBC: 18.2 — ABNORMAL HIGH
WBC: 5.1
WBC: 9.5
WBC: 18.1 — ABNORMAL HIGH
WBC: 20.1 — ABNORMAL HIGH

## 2011-03-18 LAB — URINALYSIS, ROUTINE W REFLEX MICROSCOPIC
Glucose, UA: NEGATIVE
Ketones, ur: NEGATIVE
Leukocytes, UA: NEGATIVE
Nitrite: NEGATIVE
Protein, ur: >300 — AB
Specific Gravity, Urine: 1.018
Urobilinogen, UA: 0.2
pH: 6

## 2011-03-18 LAB — TYPE AND SCREEN: ABO/RH(D): O POS

## 2011-03-18 LAB — HEMOGLOBIN AND HEMATOCRIT, BLOOD: Hemoglobin: 6.4 — CL

## 2011-03-18 LAB — RENAL FUNCTION PANEL
Albumin: 2.6 — ABNORMAL LOW
Albumin: 2.7 — ABNORMAL LOW
Albumin: 2.7 — ABNORMAL LOW
Albumin: 2.8 — ABNORMAL LOW
Albumin: 3 — ABNORMAL LOW
Albumin: 3.2 — ABNORMAL LOW
Albumin: 3.3 — ABNORMAL LOW
Albumin: 2.8 — ABNORMAL LOW
BUN: 35 — ABNORMAL HIGH
BUN: 37 — ABNORMAL HIGH
BUN: 56 — ABNORMAL HIGH
BUN: 83 — ABNORMAL HIGH
BUN: 56 — ABNORMAL HIGH
BUN: 67 — ABNORMAL HIGH
BUN: 83 — ABNORMAL HIGH
CO2: 25
CO2: 26
CO2: 22
CO2: 23
CO2: 24
Calcium: 8.7
Calcium: 8.8
Calcium: 9.1
Calcium: 9.3
Calcium: 9.3
Calcium: 8.8
Calcium: 9.2
Chloride: 101
Chloride: 101
Chloride: 102
Chloride: 102
Chloride: 103
Chloride: 99
Chloride: 99
Chloride: 101
Chloride: 103
Creatinine, Ser: 11.84 — ABNORMAL HIGH
Creatinine, Ser: 12.8 — ABNORMAL HIGH
Creatinine, Ser: 5.1 — ABNORMAL HIGH
Creatinine, Ser: 6.97 — ABNORMAL HIGH
Creatinine, Ser: 7.15 — ABNORMAL HIGH
Creatinine, Ser: 6.91 — ABNORMAL HIGH
Creatinine, Ser: 8.22 — ABNORMAL HIGH
GFR calc Af Amer: 11 — ABNORMAL LOW
GFR calc Af Amer: 5 — ABNORMAL LOW
GFR calc Af Amer: 5 — ABNORMAL LOW
GFR calc Af Amer: 9 — ABNORMAL LOW
GFR calc non Af Amer: 11 — ABNORMAL LOW
GFR calc non Af Amer: 4 — ABNORMAL LOW
GFR calc non Af Amer: 4 — ABNORMAL LOW
GFR calc non Af Amer: 8 — ABNORMAL LOW
GFR calc non Af Amer: 9 — ABNORMAL LOW
GFR calc non Af Amer: 7 — ABNORMAL LOW
GFR calc non Af Amer: 8 — ABNORMAL LOW
Glucose, Bld: 96
Glucose, Bld: 98
Glucose, Bld: 98
Glucose, Bld: 98
Glucose, Bld: 116 — ABNORMAL HIGH
Glucose, Bld: 81
Phosphorus: 5.6 — ABNORMAL HIGH
Phosphorus: 6.6 — ABNORMAL HIGH
Phosphorus: 7 — ABNORMAL HIGH
Phosphorus: 8.2 — ABNORMAL HIGH
Potassium: 4.4
Potassium: 4.5
Potassium: 4.6
Potassium: 5
Potassium: 4.2
Sodium: 131 — ABNORMAL LOW
Sodium: 136
Sodium: 138
Sodium: 138

## 2011-03-18 LAB — APTT
aPTT: 27
aPTT: 38 — ABNORMAL HIGH

## 2011-03-18 LAB — PROTIME-INR
INR: 1
Prothrombin Time: 13.7

## 2011-03-18 LAB — I-STAT 8, (EC8 V) (CONVERTED LAB)
BUN: 68 — ABNORMAL HIGH
Chloride: 106
HCT: 21 — ABNORMAL LOW
Hemoglobin: 7.1 — CL
Operator id: 285491
Potassium: 4.4
Sodium: 133 — ABNORMAL LOW

## 2011-03-18 LAB — LACTATE DEHYDROGENASE
LDH: 219
LDH: 232
LDH: 279 — ABNORMAL HIGH
LDH: 282 — ABNORMAL HIGH
LDH: 355 — ABNORMAL HIGH
LDH: 460 — ABNORMAL HIGH
LDH: 223
LDH: 251 — ABNORMAL HIGH

## 2011-03-18 LAB — URINALYSIS, MICROSCOPIC ONLY
Bilirubin Urine: NEGATIVE
Glucose, UA: NEGATIVE
Hgb urine dipstick: NEGATIVE
Specific Gravity, Urine: 1.011
Urobilinogen, UA: 0.2
pH: 7

## 2011-03-18 LAB — HAPTOGLOBIN: Haptoglobin: 93

## 2011-03-18 LAB — BASIC METABOLIC PANEL
BUN: 25 — ABNORMAL HIGH
BUN: 52 — ABNORMAL HIGH
CO2: 20
CO2: 27
Calcium: 8.8
Calcium: 9.1
Chloride: 95 — ABNORMAL LOW
Creatinine, Ser: 11.24 — ABNORMAL HIGH
Creatinine, Ser: 8.53 — ABNORMAL HIGH
GFR calc non Af Amer: 6 — ABNORMAL LOW
Glucose, Bld: 118 — ABNORMAL HIGH
Glucose, Bld: 128 — ABNORMAL HIGH
Glucose, Bld: 91
Potassium: 4.2
Potassium: 5.4 — ABNORMAL HIGH
Sodium: 134 — ABNORMAL LOW

## 2011-03-18 LAB — COMPREHENSIVE METABOLIC PANEL
ALT: 14
AST: 26
Albumin: 3.3 — ABNORMAL LOW
Alkaline Phosphatase: 55
BUN: 65 — ABNORMAL HIGH
Chloride: 99
Potassium: 4
Sodium: 131 — ABNORMAL LOW
Total Bilirubin: 0.8
Total Protein: 6.6

## 2011-03-18 LAB — PREPARE RBC (CROSSMATCH)

## 2011-03-18 LAB — CULTURE, BLOOD (ROUTINE X 2)

## 2011-03-18 LAB — URINE MICROSCOPIC-ADD ON

## 2011-03-18 LAB — DRUGS OF ABUSE SCREEN W/O ALC, ROUTINE URINE
Cocaine Metabolites: NEGATIVE
Opiate Screen, Urine: NEGATIVE
Phencyclidine (PCP): NEGATIVE
Propoxyphene: NEGATIVE

## 2011-03-18 LAB — RETICULOCYTES: Retic Ct Pct: 2.1

## 2011-03-18 LAB — EPSTEIN-BARR VIRUS EARLY D ANTIGEN ANTIBODY, IGG: EBV EA IgG: 3.53 — ABNORMAL HIGH

## 2011-03-18 LAB — PTH, INTACT AND CALCIUM
Calcium, Total (PTH): 8.3 — ABNORMAL LOW
PTH: 466.1 — ABNORMAL HIGH

## 2011-03-18 LAB — CARDIAC PANEL(CRET KIN+CKTOT+MB+TROPI)
Relative Index: 1.5
Troponin I: 0.15 — ABNORMAL HIGH

## 2011-03-18 LAB — LACTIC ACID, PLASMA: Lactic Acid, Venous: 1.3

## 2011-03-18 LAB — VITAMIN B12: Vitamin B-12: 459 (ref 211–911)

## 2011-03-18 LAB — CMV ABS, IGG+IGM (CYTOMEGALOVIRUS): Cytomegalovirus Ab-IgG: 7.04 Index — ABNORMAL HIGH (ref <0.80)

## 2011-03-18 LAB — CK TOTAL AND CKMB (NOT AT ARMC): Relative Index: 1.1

## 2011-03-18 LAB — MAGNESIUM
Magnesium: 2.3
Magnesium: 2.4

## 2011-03-18 LAB — ANA: Anti Nuclear Antibody(ANA): NEGATIVE

## 2011-03-18 LAB — MICROALBUMIN / CREATININE URINE RATIO: Microalb, Ur: 373 — ABNORMAL HIGH

## 2011-03-18 LAB — TROPONIN I: Troponin I: 0.16 — ABNORMAL HIGH

## 2011-03-18 LAB — C3 COMPLEMENT: C3 Complement: 101

## 2011-07-29 DIAGNOSIS — N2581 Secondary hyperparathyroidism of renal origin: Secondary | ICD-10-CM | POA: Diagnosis not present

## 2011-07-29 DIAGNOSIS — E785 Hyperlipidemia, unspecified: Secondary | ICD-10-CM | POA: Diagnosis not present

## 2011-07-29 DIAGNOSIS — I10 Essential (primary) hypertension: Secondary | ICD-10-CM | POA: Diagnosis not present

## 2011-07-29 DIAGNOSIS — Z940 Kidney transplant status: Secondary | ICD-10-CM | POA: Diagnosis not present

## 2011-07-30 DIAGNOSIS — Z940 Kidney transplant status: Secondary | ICD-10-CM | POA: Diagnosis not present

## 2011-08-27 DIAGNOSIS — Z940 Kidney transplant status: Secondary | ICD-10-CM | POA: Diagnosis not present

## 2011-08-27 DIAGNOSIS — E785 Hyperlipidemia, unspecified: Secondary | ICD-10-CM | POA: Diagnosis not present

## 2011-09-09 DIAGNOSIS — Z940 Kidney transplant status: Secondary | ICD-10-CM | POA: Diagnosis not present

## 2011-11-04 DIAGNOSIS — E785 Hyperlipidemia, unspecified: Secondary | ICD-10-CM | POA: Diagnosis not present

## 2011-11-04 DIAGNOSIS — I10 Essential (primary) hypertension: Secondary | ICD-10-CM | POA: Diagnosis not present

## 2011-11-04 DIAGNOSIS — Z940 Kidney transplant status: Secondary | ICD-10-CM | POA: Diagnosis not present

## 2011-11-04 DIAGNOSIS — D649 Anemia, unspecified: Secondary | ICD-10-CM | POA: Diagnosis not present

## 2012-01-09 DIAGNOSIS — I10 Essential (primary) hypertension: Secondary | ICD-10-CM | POA: Diagnosis not present

## 2012-01-09 DIAGNOSIS — D649 Anemia, unspecified: Secondary | ICD-10-CM | POA: Diagnosis not present

## 2012-01-09 DIAGNOSIS — E785 Hyperlipidemia, unspecified: Secondary | ICD-10-CM | POA: Diagnosis not present

## 2012-01-09 DIAGNOSIS — Z940 Kidney transplant status: Secondary | ICD-10-CM | POA: Diagnosis not present

## 2012-01-15 DIAGNOSIS — Z79899 Other long term (current) drug therapy: Secondary | ICD-10-CM | POA: Diagnosis not present

## 2012-01-15 DIAGNOSIS — Z940 Kidney transplant status: Secondary | ICD-10-CM | POA: Diagnosis not present

## 2012-01-15 DIAGNOSIS — Z111 Encounter for screening for respiratory tuberculosis: Secondary | ICD-10-CM | POA: Diagnosis not present

## 2012-01-15 DIAGNOSIS — N390 Urinary tract infection, site not specified: Secondary | ICD-10-CM | POA: Diagnosis not present

## 2012-01-17 DIAGNOSIS — Z940 Kidney transplant status: Secondary | ICD-10-CM | POA: Diagnosis not present

## 2012-01-23 DIAGNOSIS — I120 Hypertensive chronic kidney disease with stage 5 chronic kidney disease or end stage renal disease: Secondary | ICD-10-CM | POA: Diagnosis not present

## 2012-03-24 DIAGNOSIS — Z23 Encounter for immunization: Secondary | ICD-10-CM | POA: Diagnosis not present

## 2012-03-24 DIAGNOSIS — I10 Essential (primary) hypertension: Secondary | ICD-10-CM | POA: Diagnosis not present

## 2012-03-24 DIAGNOSIS — E785 Hyperlipidemia, unspecified: Secondary | ICD-10-CM | POA: Diagnosis not present

## 2012-03-24 DIAGNOSIS — Z940 Kidney transplant status: Secondary | ICD-10-CM | POA: Diagnosis not present

## 2012-03-24 DIAGNOSIS — N2581 Secondary hyperparathyroidism of renal origin: Secondary | ICD-10-CM | POA: Diagnosis not present

## 2012-03-24 DIAGNOSIS — I120 Hypertensive chronic kidney disease with stage 5 chronic kidney disease or end stage renal disease: Secondary | ICD-10-CM | POA: Diagnosis not present

## 2012-03-24 DIAGNOSIS — E213 Hyperparathyroidism, unspecified: Secondary | ICD-10-CM | POA: Diagnosis not present

## 2012-04-02 DIAGNOSIS — Z940 Kidney transplant status: Secondary | ICD-10-CM | POA: Diagnosis not present

## 2012-06-09 DIAGNOSIS — D649 Anemia, unspecified: Secondary | ICD-10-CM | POA: Diagnosis not present

## 2012-06-09 DIAGNOSIS — N390 Urinary tract infection, site not specified: Secondary | ICD-10-CM | POA: Diagnosis not present

## 2012-06-09 DIAGNOSIS — I10 Essential (primary) hypertension: Secondary | ICD-10-CM | POA: Diagnosis not present

## 2012-06-09 DIAGNOSIS — Z940 Kidney transplant status: Secondary | ICD-10-CM | POA: Diagnosis not present

## 2012-06-09 DIAGNOSIS — E213 Hyperparathyroidism, unspecified: Secondary | ICD-10-CM | POA: Diagnosis not present

## 2012-06-09 DIAGNOSIS — E785 Hyperlipidemia, unspecified: Secondary | ICD-10-CM | POA: Diagnosis not present

## 2012-06-09 DIAGNOSIS — N2581 Secondary hyperparathyroidism of renal origin: Secondary | ICD-10-CM | POA: Diagnosis not present

## 2012-06-10 DIAGNOSIS — E872 Acidosis: Secondary | ICD-10-CM | POA: Diagnosis present

## 2012-06-10 DIAGNOSIS — Z87891 Personal history of nicotine dependence: Secondary | ICD-10-CM | POA: Diagnosis not present

## 2012-06-10 DIAGNOSIS — K219 Gastro-esophageal reflux disease without esophagitis: Secondary | ICD-10-CM | POA: Diagnosis present

## 2012-06-10 DIAGNOSIS — Z48298 Encounter for aftercare following other organ transplant: Secondary | ICD-10-CM | POA: Diagnosis not present

## 2012-06-10 DIAGNOSIS — T8610 Unspecified complication of kidney transplant: Secondary | ICD-10-CM | POA: Diagnosis present

## 2012-06-10 DIAGNOSIS — N179 Acute kidney failure, unspecified: Secondary | ICD-10-CM | POA: Diagnosis not present

## 2012-06-10 DIAGNOSIS — Z940 Kidney transplant status: Secondary | ICD-10-CM | POA: Diagnosis not present

## 2012-06-10 DIAGNOSIS — D649 Anemia, unspecified: Secondary | ICD-10-CM | POA: Diagnosis present

## 2012-06-10 DIAGNOSIS — R6889 Other general symptoms and signs: Secondary | ICD-10-CM | POA: Diagnosis not present

## 2012-06-10 DIAGNOSIS — I10 Essential (primary) hypertension: Secondary | ICD-10-CM | POA: Diagnosis present

## 2012-06-10 DIAGNOSIS — E871 Hypo-osmolality and hyponatremia: Secondary | ICD-10-CM | POA: Diagnosis present

## 2012-06-10 DIAGNOSIS — IMO0002 Reserved for concepts with insufficient information to code with codable children: Secondary | ICD-10-CM | POA: Diagnosis not present

## 2012-06-10 DIAGNOSIS — Z79899 Other long term (current) drug therapy: Secondary | ICD-10-CM | POA: Diagnosis not present

## 2012-06-12 DIAGNOSIS — Z940 Kidney transplant status: Secondary | ICD-10-CM | POA: Diagnosis not present

## 2012-06-12 DIAGNOSIS — Z48298 Encounter for aftercare following other organ transplant: Secondary | ICD-10-CM | POA: Diagnosis not present

## 2012-06-15 DIAGNOSIS — N189 Chronic kidney disease, unspecified: Secondary | ICD-10-CM | POA: Diagnosis not present

## 2012-06-16 DIAGNOSIS — T8610 Unspecified complication of kidney transplant: Secondary | ICD-10-CM | POA: Diagnosis not present

## 2012-06-16 DIAGNOSIS — Z79899 Other long term (current) drug therapy: Secondary | ICD-10-CM | POA: Diagnosis not present

## 2012-06-16 DIAGNOSIS — Z940 Kidney transplant status: Secondary | ICD-10-CM | POA: Diagnosis not present

## 2012-06-18 DIAGNOSIS — R5381 Other malaise: Secondary | ICD-10-CM | POA: Diagnosis not present

## 2012-06-18 DIAGNOSIS — Z940 Kidney transplant status: Secondary | ICD-10-CM | POA: Diagnosis not present

## 2012-06-18 DIAGNOSIS — Z79899 Other long term (current) drug therapy: Secondary | ICD-10-CM | POA: Diagnosis not present

## 2012-06-18 DIAGNOSIS — Z48298 Encounter for aftercare following other organ transplant: Secondary | ICD-10-CM | POA: Diagnosis not present

## 2012-06-18 DIAGNOSIS — T8610 Unspecified complication of kidney transplant: Secondary | ICD-10-CM | POA: Diagnosis not present

## 2012-06-18 DIAGNOSIS — E872 Acidosis: Secondary | ICD-10-CM | POA: Diagnosis not present

## 2012-06-19 DIAGNOSIS — Z940 Kidney transplant status: Secondary | ICD-10-CM | POA: Diagnosis not present

## 2012-06-19 DIAGNOSIS — T8610 Unspecified complication of kidney transplant: Secondary | ICD-10-CM | POA: Diagnosis not present

## 2012-06-19 DIAGNOSIS — E872 Acidosis: Secondary | ICD-10-CM | POA: Diagnosis not present

## 2012-06-19 DIAGNOSIS — R5383 Other fatigue: Secondary | ICD-10-CM | POA: Diagnosis not present

## 2012-06-19 DIAGNOSIS — R5381 Other malaise: Secondary | ICD-10-CM | POA: Diagnosis not present

## 2012-06-19 DIAGNOSIS — Z79899 Other long term (current) drug therapy: Secondary | ICD-10-CM | POA: Diagnosis not present

## 2012-06-22 DIAGNOSIS — Z79899 Other long term (current) drug therapy: Secondary | ICD-10-CM | POA: Diagnosis not present

## 2012-06-22 DIAGNOSIS — Z940 Kidney transplant status: Secondary | ICD-10-CM | POA: Diagnosis not present

## 2012-06-22 DIAGNOSIS — N135 Crossing vessel and stricture of ureter without hydronephrosis: Secondary | ICD-10-CM | POA: Diagnosis present

## 2012-06-22 DIAGNOSIS — T8610 Unspecified complication of kidney transplant: Secondary | ICD-10-CM | POA: Diagnosis not present

## 2012-06-22 DIAGNOSIS — I120 Hypertensive chronic kidney disease with stage 5 chronic kidney disease or end stage renal disease: Secondary | ICD-10-CM | POA: Diagnosis present

## 2012-06-22 DIAGNOSIS — N186 End stage renal disease: Secondary | ICD-10-CM | POA: Diagnosis present

## 2012-06-22 DIAGNOSIS — N179 Acute kidney failure, unspecified: Secondary | ICD-10-CM | POA: Diagnosis not present

## 2012-06-22 DIAGNOSIS — R7982 Elevated C-reactive protein (CRP): Secondary | ICD-10-CM | POA: Diagnosis not present

## 2012-06-22 DIAGNOSIS — E872 Acidosis: Secondary | ICD-10-CM | POA: Diagnosis not present

## 2012-06-23 DIAGNOSIS — Z940 Kidney transplant status: Secondary | ICD-10-CM | POA: Diagnosis not present

## 2012-06-23 DIAGNOSIS — Z79899 Other long term (current) drug therapy: Secondary | ICD-10-CM | POA: Diagnosis not present

## 2012-06-23 DIAGNOSIS — E872 Acidosis: Secondary | ICD-10-CM | POA: Diagnosis not present

## 2012-06-29 DIAGNOSIS — Z79899 Other long term (current) drug therapy: Secondary | ICD-10-CM | POA: Diagnosis not present

## 2012-06-29 DIAGNOSIS — I10 Essential (primary) hypertension: Secondary | ICD-10-CM | POA: Diagnosis not present

## 2012-06-29 DIAGNOSIS — T8610 Unspecified complication of kidney transplant: Secondary | ICD-10-CM | POA: Diagnosis not present

## 2012-07-01 DIAGNOSIS — Z940 Kidney transplant status: Secondary | ICD-10-CM | POA: Diagnosis not present

## 2012-07-01 DIAGNOSIS — Z79899 Other long term (current) drug therapy: Secondary | ICD-10-CM | POA: Diagnosis not present

## 2012-07-01 DIAGNOSIS — E872 Acidosis: Secondary | ICD-10-CM | POA: Diagnosis not present

## 2012-07-03 DIAGNOSIS — Z529 Donor of unspecified organ or tissue: Secondary | ICD-10-CM | POA: Diagnosis not present

## 2012-07-06 DIAGNOSIS — T8610 Unspecified complication of kidney transplant: Secondary | ICD-10-CM | POA: Diagnosis not present

## 2012-07-08 DIAGNOSIS — Z529 Donor of unspecified organ or tissue: Secondary | ICD-10-CM | POA: Diagnosis not present

## 2012-07-08 DIAGNOSIS — T8610 Unspecified complication of kidney transplant: Secondary | ICD-10-CM | POA: Diagnosis not present

## 2012-07-10 DIAGNOSIS — I10 Essential (primary) hypertension: Secondary | ICD-10-CM | POA: Diagnosis not present

## 2012-07-10 DIAGNOSIS — Z79899 Other long term (current) drug therapy: Secondary | ICD-10-CM | POA: Diagnosis not present

## 2012-07-10 DIAGNOSIS — T8610 Unspecified complication of kidney transplant: Secondary | ICD-10-CM | POA: Diagnosis not present

## 2012-07-14 DIAGNOSIS — T8610 Unspecified complication of kidney transplant: Secondary | ICD-10-CM | POA: Diagnosis not present

## 2012-07-14 DIAGNOSIS — Z529 Donor of unspecified organ or tissue: Secondary | ICD-10-CM | POA: Diagnosis not present

## 2012-07-15 DIAGNOSIS — Z529 Donor of unspecified organ or tissue: Secondary | ICD-10-CM | POA: Diagnosis not present

## 2012-07-15 DIAGNOSIS — T8610 Unspecified complication of kidney transplant: Secondary | ICD-10-CM | POA: Diagnosis not present

## 2012-07-17 DIAGNOSIS — Z79899 Other long term (current) drug therapy: Secondary | ICD-10-CM | POA: Diagnosis not present

## 2012-07-17 DIAGNOSIS — N135 Crossing vessel and stricture of ureter without hydronephrosis: Secondary | ICD-10-CM | POA: Diagnosis not present

## 2012-07-17 DIAGNOSIS — Z940 Kidney transplant status: Secondary | ICD-10-CM | POA: Diagnosis not present

## 2012-07-17 DIAGNOSIS — T8610 Unspecified complication of kidney transplant: Secondary | ICD-10-CM | POA: Diagnosis not present

## 2012-07-17 DIAGNOSIS — Z529 Donor of unspecified organ or tissue: Secondary | ICD-10-CM | POA: Diagnosis not present

## 2012-07-17 DIAGNOSIS — I10 Essential (primary) hypertension: Secondary | ICD-10-CM | POA: Diagnosis not present

## 2012-07-21 DIAGNOSIS — T8610 Unspecified complication of kidney transplant: Secondary | ICD-10-CM | POA: Diagnosis not present

## 2012-07-21 DIAGNOSIS — Z529 Donor of unspecified organ or tissue: Secondary | ICD-10-CM | POA: Diagnosis not present

## 2012-07-23 DIAGNOSIS — T8610 Unspecified complication of kidney transplant: Secondary | ICD-10-CM | POA: Diagnosis not present

## 2012-07-23 DIAGNOSIS — Z48298 Encounter for aftercare following other organ transplant: Secondary | ICD-10-CM | POA: Diagnosis not present

## 2012-07-23 DIAGNOSIS — Z940 Kidney transplant status: Secondary | ICD-10-CM | POA: Diagnosis not present

## 2012-07-27 DIAGNOSIS — Z87891 Personal history of nicotine dependence: Secondary | ICD-10-CM | POA: Diagnosis not present

## 2012-07-27 DIAGNOSIS — K219 Gastro-esophageal reflux disease without esophagitis: Secondary | ICD-10-CM | POA: Diagnosis present

## 2012-07-27 DIAGNOSIS — R7309 Other abnormal glucose: Secondary | ICD-10-CM | POA: Diagnosis present

## 2012-07-27 DIAGNOSIS — Z9119 Patient's noncompliance with other medical treatment and regimen: Secondary | ICD-10-CM | POA: Diagnosis not present

## 2012-07-27 DIAGNOSIS — N179 Acute kidney failure, unspecified: Secondary | ICD-10-CM | POA: Diagnosis not present

## 2012-07-27 DIAGNOSIS — I129 Hypertensive chronic kidney disease with stage 1 through stage 4 chronic kidney disease, or unspecified chronic kidney disease: Secondary | ICD-10-CM | POA: Diagnosis present

## 2012-07-27 DIAGNOSIS — Z940 Kidney transplant status: Secondary | ICD-10-CM | POA: Diagnosis not present

## 2012-07-27 DIAGNOSIS — Z79899 Other long term (current) drug therapy: Secondary | ICD-10-CM | POA: Diagnosis not present

## 2012-07-27 DIAGNOSIS — B259 Cytomegaloviral disease, unspecified: Secondary | ICD-10-CM | POA: Diagnosis present

## 2012-07-27 DIAGNOSIS — T8610 Unspecified complication of kidney transplant: Secondary | ICD-10-CM | POA: Diagnosis not present

## 2012-07-27 DIAGNOSIS — E872 Acidosis: Secondary | ICD-10-CM | POA: Diagnosis present

## 2012-07-27 DIAGNOSIS — E8809 Other disorders of plasma-protein metabolism, not elsewhere classified: Secondary | ICD-10-CM | POA: Diagnosis present

## 2012-08-03 DIAGNOSIS — E785 Hyperlipidemia, unspecified: Secondary | ICD-10-CM | POA: Diagnosis not present

## 2012-08-03 DIAGNOSIS — Z940 Kidney transplant status: Secondary | ICD-10-CM | POA: Diagnosis not present

## 2012-08-03 DIAGNOSIS — I10 Essential (primary) hypertension: Secondary | ICD-10-CM | POA: Diagnosis not present

## 2012-08-03 DIAGNOSIS — D649 Anemia, unspecified: Secondary | ICD-10-CM | POA: Diagnosis not present

## 2012-08-10 DIAGNOSIS — Z529 Donor of unspecified organ or tissue: Secondary | ICD-10-CM | POA: Diagnosis not present

## 2012-08-10 DIAGNOSIS — N209 Urinary calculus, unspecified: Secondary | ICD-10-CM | POA: Diagnosis not present

## 2012-08-10 DIAGNOSIS — T8610 Unspecified complication of kidney transplant: Secondary | ICD-10-CM | POA: Diagnosis not present

## 2012-08-20 DIAGNOSIS — N209 Urinary calculus, unspecified: Secondary | ICD-10-CM | POA: Diagnosis not present

## 2012-08-20 DIAGNOSIS — Z436 Encounter for attention to other artificial openings of urinary tract: Secondary | ICD-10-CM | POA: Diagnosis not present

## 2012-08-20 DIAGNOSIS — T8610 Unspecified complication of kidney transplant: Secondary | ICD-10-CM | POA: Diagnosis not present

## 2012-08-27 DIAGNOSIS — I10 Essential (primary) hypertension: Secondary | ICD-10-CM | POA: Diagnosis not present

## 2012-08-27 DIAGNOSIS — T8610 Unspecified complication of kidney transplant: Secondary | ICD-10-CM | POA: Diagnosis not present

## 2012-08-27 DIAGNOSIS — Z8719 Personal history of other diseases of the digestive system: Secondary | ICD-10-CM | POA: Diagnosis not present

## 2012-08-27 DIAGNOSIS — Z79899 Other long term (current) drug therapy: Secondary | ICD-10-CM | POA: Diagnosis not present

## 2012-08-27 DIAGNOSIS — R894 Abnormal immunological findings in specimens from other organs, systems and tissues: Secondary | ICD-10-CM | POA: Diagnosis not present

## 2012-08-27 DIAGNOSIS — B259 Cytomegaloviral disease, unspecified: Secondary | ICD-10-CM | POA: Diagnosis not present

## 2012-08-27 DIAGNOSIS — Z529 Donor of unspecified organ or tissue: Secondary | ICD-10-CM | POA: Diagnosis not present

## 2012-08-27 DIAGNOSIS — Z940 Kidney transplant status: Secondary | ICD-10-CM | POA: Diagnosis not present

## 2012-08-27 DIAGNOSIS — N186 End stage renal disease: Secondary | ICD-10-CM | POA: Diagnosis not present

## 2012-08-27 DIAGNOSIS — I120 Hypertensive chronic kidney disease with stage 5 chronic kidney disease or end stage renal disease: Secondary | ICD-10-CM | POA: Diagnosis not present

## 2012-09-04 DIAGNOSIS — Z298 Encounter for other specified prophylactic measures: Secondary | ICD-10-CM | POA: Diagnosis not present

## 2012-09-04 DIAGNOSIS — E872 Acidosis: Secondary | ICD-10-CM | POA: Diagnosis not present

## 2012-09-04 DIAGNOSIS — IMO0002 Reserved for concepts with insufficient information to code with codable children: Secondary | ICD-10-CM | POA: Diagnosis not present

## 2012-09-04 DIAGNOSIS — N9989 Other postprocedural complications and disorders of genitourinary system: Secondary | ICD-10-CM | POA: Diagnosis not present

## 2012-09-04 DIAGNOSIS — Z940 Kidney transplant status: Secondary | ICD-10-CM | POA: Diagnosis not present

## 2012-09-04 DIAGNOSIS — B259 Cytomegaloviral disease, unspecified: Secondary | ICD-10-CM | POA: Diagnosis not present

## 2012-09-04 DIAGNOSIS — Z79899 Other long term (current) drug therapy: Secondary | ICD-10-CM | POA: Diagnosis not present

## 2012-09-04 DIAGNOSIS — T8610 Unspecified complication of kidney transplant: Secondary | ICD-10-CM | POA: Diagnosis not present

## 2012-09-04 DIAGNOSIS — D899 Disorder involving the immune mechanism, unspecified: Secondary | ICD-10-CM | POA: Diagnosis not present

## 2012-09-09 DIAGNOSIS — I10 Essential (primary) hypertension: Secondary | ICD-10-CM | POA: Diagnosis not present

## 2012-09-09 DIAGNOSIS — Z79899 Other long term (current) drug therapy: Secondary | ICD-10-CM | POA: Diagnosis not present

## 2012-09-09 DIAGNOSIS — T8610 Unspecified complication of kidney transplant: Secondary | ICD-10-CM | POA: Diagnosis not present

## 2012-09-09 DIAGNOSIS — B259 Cytomegaloviral disease, unspecified: Secondary | ICD-10-CM | POA: Diagnosis not present

## 2012-09-10 DIAGNOSIS — Z940 Kidney transplant status: Secondary | ICD-10-CM | POA: Diagnosis not present

## 2012-09-10 DIAGNOSIS — Z79899 Other long term (current) drug therapy: Secondary | ICD-10-CM | POA: Diagnosis not present

## 2012-09-10 DIAGNOSIS — R197 Diarrhea, unspecified: Secondary | ICD-10-CM | POA: Diagnosis not present

## 2012-09-14 DIAGNOSIS — N2581 Secondary hyperparathyroidism of renal origin: Secondary | ICD-10-CM | POA: Diagnosis not present

## 2012-09-14 DIAGNOSIS — I10 Essential (primary) hypertension: Secondary | ICD-10-CM | POA: Diagnosis not present

## 2012-09-14 DIAGNOSIS — D649 Anemia, unspecified: Secondary | ICD-10-CM | POA: Diagnosis not present

## 2012-09-14 DIAGNOSIS — Z940 Kidney transplant status: Secondary | ICD-10-CM | POA: Diagnosis not present

## 2012-10-05 DIAGNOSIS — Z940 Kidney transplant status: Secondary | ICD-10-CM | POA: Diagnosis not present

## 2012-10-05 DIAGNOSIS — I10 Essential (primary) hypertension: Secondary | ICD-10-CM | POA: Diagnosis not present

## 2012-10-05 DIAGNOSIS — D649 Anemia, unspecified: Secondary | ICD-10-CM | POA: Diagnosis not present

## 2012-10-05 DIAGNOSIS — N2581 Secondary hyperparathyroidism of renal origin: Secondary | ICD-10-CM | POA: Diagnosis not present

## 2012-10-08 DIAGNOSIS — T8610 Unspecified complication of kidney transplant: Secondary | ICD-10-CM | POA: Diagnosis not present

## 2012-10-08 DIAGNOSIS — Z79899 Other long term (current) drug therapy: Secondary | ICD-10-CM | POA: Diagnosis not present

## 2012-10-08 DIAGNOSIS — Z7982 Long term (current) use of aspirin: Secondary | ICD-10-CM | POA: Diagnosis not present

## 2012-10-08 DIAGNOSIS — IMO0002 Reserved for concepts with insufficient information to code with codable children: Secondary | ICD-10-CM | POA: Diagnosis not present

## 2012-10-08 DIAGNOSIS — B259 Cytomegaloviral disease, unspecified: Secondary | ICD-10-CM | POA: Diagnosis not present

## 2012-10-08 DIAGNOSIS — E872 Acidosis: Secondary | ICD-10-CM | POA: Diagnosis not present

## 2012-10-08 DIAGNOSIS — D509 Iron deficiency anemia, unspecified: Secondary | ICD-10-CM | POA: Diagnosis not present

## 2012-10-08 DIAGNOSIS — Z940 Kidney transplant status: Secondary | ICD-10-CM | POA: Diagnosis not present

## 2012-10-08 DIAGNOSIS — I10 Essential (primary) hypertension: Secondary | ICD-10-CM | POA: Diagnosis not present

## 2012-10-08 DIAGNOSIS — N9989 Other postprocedural complications and disorders of genitourinary system: Secondary | ICD-10-CM | POA: Diagnosis not present

## 2012-11-19 DIAGNOSIS — Z940 Kidney transplant status: Secondary | ICD-10-CM | POA: Diagnosis not present

## 2012-11-19 DIAGNOSIS — Z436 Encounter for attention to other artificial openings of urinary tract: Secondary | ICD-10-CM | POA: Diagnosis not present

## 2012-11-19 DIAGNOSIS — N1330 Unspecified hydronephrosis: Secondary | ICD-10-CM | POA: Diagnosis not present

## 2012-12-14 DIAGNOSIS — N2581 Secondary hyperparathyroidism of renal origin: Secondary | ICD-10-CM | POA: Diagnosis not present

## 2012-12-14 DIAGNOSIS — Z940 Kidney transplant status: Secondary | ICD-10-CM | POA: Diagnosis not present

## 2012-12-14 DIAGNOSIS — I10 Essential (primary) hypertension: Secondary | ICD-10-CM | POA: Diagnosis not present

## 2012-12-14 DIAGNOSIS — D649 Anemia, unspecified: Secondary | ICD-10-CM | POA: Diagnosis not present

## 2012-12-15 DIAGNOSIS — N390 Urinary tract infection, site not specified: Secondary | ICD-10-CM | POA: Diagnosis not present

## 2012-12-15 DIAGNOSIS — Z940 Kidney transplant status: Secondary | ICD-10-CM | POA: Diagnosis not present

## 2012-12-15 DIAGNOSIS — E785 Hyperlipidemia, unspecified: Secondary | ICD-10-CM | POA: Diagnosis not present

## 2012-12-15 DIAGNOSIS — N2581 Secondary hyperparathyroidism of renal origin: Secondary | ICD-10-CM | POA: Diagnosis not present

## 2012-12-15 DIAGNOSIS — D649 Anemia, unspecified: Secondary | ICD-10-CM | POA: Diagnosis not present

## 2012-12-17 DIAGNOSIS — Z940 Kidney transplant status: Secondary | ICD-10-CM | POA: Diagnosis not present

## 2012-12-17 DIAGNOSIS — I120 Hypertensive chronic kidney disease with stage 5 chronic kidney disease or end stage renal disease: Secondary | ICD-10-CM | POA: Diagnosis not present

## 2012-12-17 DIAGNOSIS — Z529 Donor of unspecified organ or tissue: Secondary | ICD-10-CM | POA: Diagnosis not present

## 2012-12-17 DIAGNOSIS — D631 Anemia in chronic kidney disease: Secondary | ICD-10-CM | POA: Diagnosis not present

## 2012-12-17 DIAGNOSIS — N9989 Other postprocedural complications and disorders of genitourinary system: Secondary | ICD-10-CM | POA: Diagnosis not present

## 2012-12-17 DIAGNOSIS — I10 Essential (primary) hypertension: Secondary | ICD-10-CM | POA: Diagnosis not present

## 2012-12-17 DIAGNOSIS — D8989 Other specified disorders involving the immune mechanism, not elsewhere classified: Secondary | ICD-10-CM | POA: Diagnosis not present

## 2012-12-17 DIAGNOSIS — Z48298 Encounter for aftercare following other organ transplant: Secondary | ICD-10-CM | POA: Diagnosis not present

## 2012-12-17 DIAGNOSIS — Z79899 Other long term (current) drug therapy: Secondary | ICD-10-CM | POA: Diagnosis not present

## 2012-12-17 DIAGNOSIS — N042 Nephrotic syndrome with diffuse membranous glomerulonephritis: Secondary | ICD-10-CM | POA: Diagnosis not present

## 2012-12-17 DIAGNOSIS — T8610 Unspecified complication of kidney transplant: Secondary | ICD-10-CM | POA: Diagnosis not present

## 2012-12-17 DIAGNOSIS — N186 End stage renal disease: Secondary | ICD-10-CM | POA: Diagnosis not present

## 2012-12-17 DIAGNOSIS — Z7901 Long term (current) use of anticoagulants: Secondary | ICD-10-CM | POA: Diagnosis not present

## 2012-12-17 DIAGNOSIS — Z5181 Encounter for therapeutic drug level monitoring: Secondary | ICD-10-CM | POA: Diagnosis not present

## 2012-12-18 DIAGNOSIS — T8610 Unspecified complication of kidney transplant: Secondary | ICD-10-CM | POA: Diagnosis not present

## 2012-12-18 DIAGNOSIS — N1330 Unspecified hydronephrosis: Secondary | ICD-10-CM | POA: Diagnosis not present

## 2012-12-18 DIAGNOSIS — N390 Urinary tract infection, site not specified: Secondary | ICD-10-CM | POA: Diagnosis not present

## 2012-12-18 DIAGNOSIS — R82998 Other abnormal findings in urine: Secondary | ICD-10-CM | POA: Diagnosis not present

## 2013-01-05 DIAGNOSIS — I10 Essential (primary) hypertension: Secondary | ICD-10-CM | POA: Diagnosis not present

## 2013-01-05 DIAGNOSIS — Z79899 Other long term (current) drug therapy: Secondary | ICD-10-CM | POA: Diagnosis not present

## 2013-01-05 DIAGNOSIS — N135 Crossing vessel and stricture of ureter without hydronephrosis: Secondary | ICD-10-CM | POA: Diagnosis not present

## 2013-01-05 DIAGNOSIS — Z87891 Personal history of nicotine dependence: Secondary | ICD-10-CM | POA: Diagnosis not present

## 2013-01-05 DIAGNOSIS — K219 Gastro-esophageal reflux disease without esophagitis: Secondary | ICD-10-CM | POA: Diagnosis not present

## 2013-01-05 DIAGNOSIS — T8610 Unspecified complication of kidney transplant: Secondary | ICD-10-CM | POA: Diagnosis not present

## 2013-01-05 DIAGNOSIS — Z466 Encounter for fitting and adjustment of urinary device: Secondary | ICD-10-CM | POA: Diagnosis not present

## 2013-03-16 DIAGNOSIS — Z79899 Other long term (current) drug therapy: Secondary | ICD-10-CM | POA: Diagnosis not present

## 2013-03-16 DIAGNOSIS — T8610 Unspecified complication of kidney transplant: Secondary | ICD-10-CM | POA: Diagnosis not present

## 2013-03-16 DIAGNOSIS — Z48298 Encounter for aftercare following other organ transplant: Secondary | ICD-10-CM | POA: Diagnosis not present

## 2013-03-16 DIAGNOSIS — E872 Acidosis, unspecified: Secondary | ICD-10-CM | POA: Diagnosis not present

## 2013-03-16 DIAGNOSIS — E785 Hyperlipidemia, unspecified: Secondary | ICD-10-CM | POA: Diagnosis not present

## 2013-03-16 DIAGNOSIS — N179 Acute kidney failure, unspecified: Secondary | ICD-10-CM | POA: Diagnosis not present

## 2013-03-16 DIAGNOSIS — R079 Chest pain, unspecified: Secondary | ICD-10-CM | POA: Diagnosis not present

## 2013-03-16 DIAGNOSIS — Z940 Kidney transplant status: Secondary | ICD-10-CM | POA: Diagnosis not present

## 2013-03-16 DIAGNOSIS — E871 Hypo-osmolality and hyponatremia: Secondary | ICD-10-CM | POA: Diagnosis not present

## 2013-03-16 DIAGNOSIS — Z87891 Personal history of nicotine dependence: Secondary | ICD-10-CM | POA: Diagnosis not present

## 2013-03-16 DIAGNOSIS — I10 Essential (primary) hypertension: Secondary | ICD-10-CM | POA: Diagnosis not present

## 2013-03-16 DIAGNOSIS — Z9119 Patient's noncompliance with other medical treatment and regimen: Secondary | ICD-10-CM | POA: Diagnosis not present

## 2013-03-16 DIAGNOSIS — I498 Other specified cardiac arrhythmias: Secondary | ICD-10-CM | POA: Diagnosis not present

## 2013-03-16 DIAGNOSIS — D638 Anemia in other chronic diseases classified elsewhere: Secondary | ICD-10-CM | POA: Diagnosis present

## 2013-03-16 DIAGNOSIS — Z5181 Encounter for therapeutic drug level monitoring: Secondary | ICD-10-CM | POA: Diagnosis not present

## 2013-03-22 DIAGNOSIS — R7989 Other specified abnormal findings of blood chemistry: Secondary | ICD-10-CM | POA: Diagnosis not present

## 2013-03-22 DIAGNOSIS — D631 Anemia in chronic kidney disease: Secondary | ICD-10-CM | POA: Diagnosis not present

## 2013-03-22 DIAGNOSIS — E860 Dehydration: Secondary | ICD-10-CM | POA: Diagnosis not present

## 2013-03-22 DIAGNOSIS — T8610 Unspecified complication of kidney transplant: Secondary | ICD-10-CM | POA: Diagnosis not present

## 2013-03-22 DIAGNOSIS — Z940 Kidney transplant status: Secondary | ICD-10-CM | POA: Diagnosis not present

## 2013-03-22 DIAGNOSIS — E785 Hyperlipidemia, unspecified: Secondary | ICD-10-CM | POA: Diagnosis not present

## 2013-03-22 DIAGNOSIS — E872 Acidosis: Secondary | ICD-10-CM | POA: Diagnosis not present

## 2013-03-22 DIAGNOSIS — Z79899 Other long term (current) drug therapy: Secondary | ICD-10-CM | POA: Diagnosis not present

## 2013-03-22 DIAGNOSIS — I10 Essential (primary) hypertension: Secondary | ICD-10-CM | POA: Diagnosis not present

## 2013-03-23 DIAGNOSIS — Z48298 Encounter for aftercare following other organ transplant: Secondary | ICD-10-CM | POA: Diagnosis not present

## 2013-03-23 DIAGNOSIS — Z79899 Other long term (current) drug therapy: Secondary | ICD-10-CM | POA: Diagnosis not present

## 2013-03-23 DIAGNOSIS — Z940 Kidney transplant status: Secondary | ICD-10-CM | POA: Diagnosis not present

## 2013-03-24 DIAGNOSIS — E785 Hyperlipidemia, unspecified: Secondary | ICD-10-CM | POA: Diagnosis not present

## 2013-03-24 DIAGNOSIS — K219 Gastro-esophageal reflux disease without esophagitis: Secondary | ICD-10-CM | POA: Diagnosis not present

## 2013-03-24 DIAGNOSIS — I10 Essential (primary) hypertension: Secondary | ICD-10-CM | POA: Diagnosis not present

## 2013-03-24 DIAGNOSIS — T8610 Unspecified complication of kidney transplant: Secondary | ICD-10-CM | POA: Diagnosis not present

## 2013-03-24 DIAGNOSIS — D72819 Decreased white blood cell count, unspecified: Secondary | ICD-10-CM | POA: Diagnosis not present

## 2013-03-24 DIAGNOSIS — D638 Anemia in other chronic diseases classified elsewhere: Secondary | ICD-10-CM | POA: Diagnosis not present

## 2013-03-24 DIAGNOSIS — D631 Anemia in chronic kidney disease: Secondary | ICD-10-CM | POA: Diagnosis not present

## 2013-03-24 DIAGNOSIS — Z79899 Other long term (current) drug therapy: Secondary | ICD-10-CM | POA: Diagnosis not present

## 2013-03-24 DIAGNOSIS — E872 Acidosis: Secondary | ICD-10-CM | POA: Diagnosis not present

## 2013-03-24 DIAGNOSIS — Z940 Kidney transplant status: Secondary | ICD-10-CM | POA: Diagnosis not present

## 2013-03-26 DIAGNOSIS — D72829 Elevated white blood cell count, unspecified: Secondary | ICD-10-CM | POA: Diagnosis not present

## 2013-03-26 DIAGNOSIS — D631 Anemia in chronic kidney disease: Secondary | ICD-10-CM | POA: Diagnosis not present

## 2013-03-26 DIAGNOSIS — T8610 Unspecified complication of kidney transplant: Secondary | ICD-10-CM | POA: Diagnosis not present

## 2013-03-26 DIAGNOSIS — K219 Gastro-esophageal reflux disease without esophagitis: Secondary | ICD-10-CM | POA: Diagnosis not present

## 2013-03-26 DIAGNOSIS — Z79899 Other long term (current) drug therapy: Secondary | ICD-10-CM | POA: Diagnosis not present

## 2013-03-26 DIAGNOSIS — E785 Hyperlipidemia, unspecified: Secondary | ICD-10-CM | POA: Diagnosis not present

## 2013-03-26 DIAGNOSIS — N179 Acute kidney failure, unspecified: Secondary | ICD-10-CM | POA: Diagnosis not present

## 2013-03-26 DIAGNOSIS — D649 Anemia, unspecified: Secondary | ICD-10-CM | POA: Diagnosis not present

## 2013-03-26 DIAGNOSIS — Z940 Kidney transplant status: Secondary | ICD-10-CM | POA: Diagnosis not present

## 2013-03-26 DIAGNOSIS — E871 Hypo-osmolality and hyponatremia: Secondary | ICD-10-CM | POA: Diagnosis not present

## 2013-03-26 DIAGNOSIS — I10 Essential (primary) hypertension: Secondary | ICD-10-CM | POA: Diagnosis not present

## 2013-03-29 DIAGNOSIS — D696 Thrombocytopenia, unspecified: Secondary | ICD-10-CM | POA: Diagnosis not present

## 2013-03-29 DIAGNOSIS — K219 Gastro-esophageal reflux disease without esophagitis: Secondary | ICD-10-CM | POA: Diagnosis not present

## 2013-03-29 DIAGNOSIS — I10 Essential (primary) hypertension: Secondary | ICD-10-CM | POA: Diagnosis not present

## 2013-03-29 DIAGNOSIS — E872 Acidosis: Secondary | ICD-10-CM | POA: Diagnosis not present

## 2013-03-29 DIAGNOSIS — Z940 Kidney transplant status: Secondary | ICD-10-CM | POA: Diagnosis not present

## 2013-03-29 DIAGNOSIS — D631 Anemia in chronic kidney disease: Secondary | ICD-10-CM | POA: Diagnosis not present

## 2013-03-29 DIAGNOSIS — T8610 Unspecified complication of kidney transplant: Secondary | ICD-10-CM | POA: Diagnosis not present

## 2013-03-29 DIAGNOSIS — Z79899 Other long term (current) drug therapy: Secondary | ICD-10-CM | POA: Diagnosis not present

## 2013-03-29 DIAGNOSIS — E785 Hyperlipidemia, unspecified: Secondary | ICD-10-CM | POA: Diagnosis not present

## 2013-03-29 DIAGNOSIS — D649 Anemia, unspecified: Secondary | ICD-10-CM | POA: Diagnosis not present

## 2013-03-31 DIAGNOSIS — I10 Essential (primary) hypertension: Secondary | ICD-10-CM | POA: Diagnosis not present

## 2013-03-31 DIAGNOSIS — Z79899 Other long term (current) drug therapy: Secondary | ICD-10-CM | POA: Diagnosis not present

## 2013-03-31 DIAGNOSIS — D72829 Elevated white blood cell count, unspecified: Secondary | ICD-10-CM | POA: Diagnosis not present

## 2013-03-31 DIAGNOSIS — T8610 Unspecified complication of kidney transplant: Secondary | ICD-10-CM | POA: Diagnosis not present

## 2013-03-31 DIAGNOSIS — E872 Acidosis: Secondary | ICD-10-CM | POA: Diagnosis not present

## 2013-03-31 DIAGNOSIS — D696 Thrombocytopenia, unspecified: Secondary | ICD-10-CM | POA: Diagnosis not present

## 2013-03-31 DIAGNOSIS — E785 Hyperlipidemia, unspecified: Secondary | ICD-10-CM | POA: Diagnosis not present

## 2013-03-31 DIAGNOSIS — D631 Anemia in chronic kidney disease: Secondary | ICD-10-CM | POA: Diagnosis not present

## 2013-04-06 DIAGNOSIS — Z466 Encounter for fitting and adjustment of urinary device: Secondary | ICD-10-CM | POA: Diagnosis not present

## 2013-04-06 DIAGNOSIS — T8610 Unspecified complication of kidney transplant: Secondary | ICD-10-CM | POA: Diagnosis not present

## 2013-04-06 DIAGNOSIS — N135 Crossing vessel and stricture of ureter without hydronephrosis: Secondary | ICD-10-CM | POA: Diagnosis not present

## 2013-04-10 DIAGNOSIS — R300 Dysuria: Secondary | ICD-10-CM | POA: Diagnosis not present

## 2013-04-10 DIAGNOSIS — T85698A Other mechanical complication of other specified internal prosthetic devices, implants and grafts, initial encounter: Secondary | ICD-10-CM | POA: Diagnosis not present

## 2013-04-10 DIAGNOSIS — Q628 Other congenital malformations of ureter: Secondary | ICD-10-CM | POA: Diagnosis not present

## 2013-04-10 DIAGNOSIS — T8389XA Other specified complication of genitourinary prosthetic devices, implants and grafts, initial encounter: Secondary | ICD-10-CM | POA: Diagnosis not present

## 2013-04-10 DIAGNOSIS — R32 Unspecified urinary incontinence: Secondary | ICD-10-CM | POA: Diagnosis not present

## 2013-04-10 DIAGNOSIS — N135 Crossing vessel and stricture of ureter without hydronephrosis: Secondary | ICD-10-CM | POA: Diagnosis not present

## 2013-04-11 DIAGNOSIS — T8389XA Other specified complication of genitourinary prosthetic devices, implants and grafts, initial encounter: Secondary | ICD-10-CM | POA: Diagnosis not present

## 2013-04-11 DIAGNOSIS — R32 Unspecified urinary incontinence: Secondary | ICD-10-CM | POA: Diagnosis not present

## 2013-04-11 DIAGNOSIS — T85698A Other mechanical complication of other specified internal prosthetic devices, implants and grafts, initial encounter: Secondary | ICD-10-CM | POA: Diagnosis not present

## 2013-04-11 DIAGNOSIS — R300 Dysuria: Secondary | ICD-10-CM | POA: Diagnosis not present

## 2013-04-14 DIAGNOSIS — E872 Acidosis: Secondary | ICD-10-CM | POA: Diagnosis not present

## 2013-04-14 DIAGNOSIS — E871 Hypo-osmolality and hyponatremia: Secondary | ICD-10-CM | POA: Diagnosis not present

## 2013-04-14 DIAGNOSIS — D649 Anemia, unspecified: Secondary | ICD-10-CM | POA: Diagnosis not present

## 2013-04-14 DIAGNOSIS — D696 Thrombocytopenia, unspecified: Secondary | ICD-10-CM | POA: Diagnosis not present

## 2013-04-14 DIAGNOSIS — D631 Anemia in chronic kidney disease: Secondary | ICD-10-CM | POA: Diagnosis not present

## 2013-04-14 DIAGNOSIS — I10 Essential (primary) hypertension: Secondary | ICD-10-CM | POA: Diagnosis not present

## 2013-04-14 DIAGNOSIS — Z79899 Other long term (current) drug therapy: Secondary | ICD-10-CM | POA: Diagnosis not present

## 2013-04-14 DIAGNOSIS — K219 Gastro-esophageal reflux disease without esophagitis: Secondary | ICD-10-CM | POA: Diagnosis not present

## 2013-04-14 DIAGNOSIS — E785 Hyperlipidemia, unspecified: Secondary | ICD-10-CM | POA: Diagnosis not present

## 2013-04-14 DIAGNOSIS — Z940 Kidney transplant status: Secondary | ICD-10-CM | POA: Diagnosis not present

## 2013-04-14 DIAGNOSIS — T8610 Unspecified complication of kidney transplant: Secondary | ICD-10-CM | POA: Diagnosis not present

## 2013-04-20 DIAGNOSIS — Z9119 Patient's noncompliance with other medical treatment and regimen: Secondary | ICD-10-CM | POA: Diagnosis not present

## 2013-04-20 DIAGNOSIS — I129 Hypertensive chronic kidney disease with stage 1 through stage 4 chronic kidney disease, or unspecified chronic kidney disease: Secondary | ICD-10-CM | POA: Diagnosis not present

## 2013-04-20 DIAGNOSIS — Z940 Kidney transplant status: Secondary | ICD-10-CM | POA: Diagnosis not present

## 2013-04-23 DIAGNOSIS — D899 Disorder involving the immune mechanism, unspecified: Secondary | ICD-10-CM | POA: Diagnosis not present

## 2013-04-23 DIAGNOSIS — Z79899 Other long term (current) drug therapy: Secondary | ICD-10-CM | POA: Diagnosis not present

## 2013-04-23 DIAGNOSIS — Z298 Encounter for other specified prophylactic measures: Secondary | ICD-10-CM | POA: Diagnosis not present

## 2013-04-23 DIAGNOSIS — T8610 Unspecified complication of kidney transplant: Secondary | ICD-10-CM | POA: Diagnosis not present

## 2013-04-23 DIAGNOSIS — Z9483 Pancreas transplant status: Secondary | ICD-10-CM | POA: Diagnosis not present

## 2013-04-23 DIAGNOSIS — N9989 Other postprocedural complications and disorders of genitourinary system: Secondary | ICD-10-CM | POA: Diagnosis not present

## 2013-04-23 DIAGNOSIS — Z940 Kidney transplant status: Secondary | ICD-10-CM | POA: Diagnosis not present

## 2013-04-28 DIAGNOSIS — E878 Other disorders of electrolyte and fluid balance, not elsewhere classified: Secondary | ICD-10-CM | POA: Diagnosis not present

## 2013-04-28 DIAGNOSIS — Z940 Kidney transplant status: Secondary | ICD-10-CM | POA: Diagnosis not present

## 2013-04-28 DIAGNOSIS — Z79899 Other long term (current) drug therapy: Secondary | ICD-10-CM | POA: Diagnosis not present

## 2013-04-28 DIAGNOSIS — N179 Acute kidney failure, unspecified: Secondary | ICD-10-CM | POA: Diagnosis not present

## 2013-04-28 DIAGNOSIS — E872 Acidosis: Secondary | ICD-10-CM | POA: Diagnosis not present

## 2013-04-28 DIAGNOSIS — T8610 Unspecified complication of kidney transplant: Secondary | ICD-10-CM | POA: Diagnosis not present

## 2013-04-28 DIAGNOSIS — D72829 Elevated white blood cell count, unspecified: Secondary | ICD-10-CM | POA: Diagnosis not present

## 2013-05-04 DIAGNOSIS — Z9483 Pancreas transplant status: Secondary | ICD-10-CM | POA: Diagnosis not present

## 2013-05-04 DIAGNOSIS — D899 Disorder involving the immune mechanism, unspecified: Secondary | ICD-10-CM | POA: Diagnosis not present

## 2013-05-04 DIAGNOSIS — Z940 Kidney transplant status: Secondary | ICD-10-CM | POA: Diagnosis not present

## 2013-05-04 DIAGNOSIS — Z48298 Encounter for aftercare following other organ transplant: Secondary | ICD-10-CM | POA: Diagnosis not present

## 2013-05-04 DIAGNOSIS — N9989 Other postprocedural complications and disorders of genitourinary system: Secondary | ICD-10-CM | POA: Diagnosis not present

## 2013-05-04 DIAGNOSIS — T8610 Unspecified complication of kidney transplant: Secondary | ICD-10-CM | POA: Diagnosis not present

## 2013-05-04 DIAGNOSIS — Z79899 Other long term (current) drug therapy: Secondary | ICD-10-CM | POA: Diagnosis not present

## 2013-05-06 DIAGNOSIS — Z48298 Encounter for aftercare following other organ transplant: Secondary | ICD-10-CM | POA: Diagnosis not present

## 2013-05-06 DIAGNOSIS — Z940 Kidney transplant status: Secondary | ICD-10-CM | POA: Diagnosis not present

## 2013-05-07 DIAGNOSIS — Z48298 Encounter for aftercare following other organ transplant: Secondary | ICD-10-CM | POA: Diagnosis not present

## 2013-05-07 DIAGNOSIS — Z79899 Other long term (current) drug therapy: Secondary | ICD-10-CM | POA: Diagnosis not present

## 2013-05-07 DIAGNOSIS — Z940 Kidney transplant status: Secondary | ICD-10-CM | POA: Diagnosis not present

## 2013-05-11 DIAGNOSIS — E872 Acidosis: Secondary | ICD-10-CM | POA: Diagnosis not present

## 2013-05-11 DIAGNOSIS — Z940 Kidney transplant status: Secondary | ICD-10-CM | POA: Diagnosis not present

## 2013-05-11 DIAGNOSIS — E876 Hypokalemia: Secondary | ICD-10-CM | POA: Diagnosis not present

## 2013-05-11 DIAGNOSIS — Z48298 Encounter for aftercare following other organ transplant: Secondary | ICD-10-CM | POA: Diagnosis not present

## 2013-05-11 DIAGNOSIS — Z79899 Other long term (current) drug therapy: Secondary | ICD-10-CM | POA: Diagnosis not present

## 2013-05-17 DIAGNOSIS — I129 Hypertensive chronic kidney disease with stage 1 through stage 4 chronic kidney disease, or unspecified chronic kidney disease: Secondary | ICD-10-CM | POA: Diagnosis not present

## 2013-05-17 DIAGNOSIS — Z940 Kidney transplant status: Secondary | ICD-10-CM | POA: Diagnosis not present

## 2013-05-18 DIAGNOSIS — D899 Disorder involving the immune mechanism, unspecified: Secondary | ICD-10-CM | POA: Diagnosis not present

## 2013-05-18 DIAGNOSIS — Z79899 Other long term (current) drug therapy: Secondary | ICD-10-CM | POA: Diagnosis not present

## 2013-05-18 DIAGNOSIS — Z48298 Encounter for aftercare following other organ transplant: Secondary | ICD-10-CM | POA: Diagnosis not present

## 2013-05-18 DIAGNOSIS — Z940 Kidney transplant status: Secondary | ICD-10-CM | POA: Diagnosis not present

## 2013-05-25 DIAGNOSIS — Z48298 Encounter for aftercare following other organ transplant: Secondary | ICD-10-CM | POA: Diagnosis not present

## 2013-05-25 DIAGNOSIS — Z79899 Other long term (current) drug therapy: Secondary | ICD-10-CM | POA: Diagnosis not present

## 2013-05-25 DIAGNOSIS — Z940 Kidney transplant status: Secondary | ICD-10-CM | POA: Diagnosis not present

## 2013-05-27 ENCOUNTER — Encounter (HOSPITAL_COMMUNITY): Payer: Self-pay | Admitting: Emergency Medicine

## 2013-05-27 ENCOUNTER — Emergency Department (INDEPENDENT_AMBULATORY_CARE_PROVIDER_SITE_OTHER)
Admission: EM | Admit: 2013-05-27 | Discharge: 2013-05-27 | Disposition: A | Discharge status: Home / Self Care | Payer: Medicare Other | Source: Home / Self Care | Attending: Family Medicine | Admitting: Family Medicine

## 2013-05-27 DIAGNOSIS — G44209 Tension-type headache, unspecified, not intractable: Secondary | ICD-10-CM

## 2013-05-27 MED ORDER — CLONAZEPAM 1 MG PO TABS: 1.0000 mg | ORAL_TABLET | Freq: Two times a day (BID) | ORAL | Status: DC

## 2013-05-27 NOTE — ED Notes (Signed)
C/o headache for two weeks States she went to see PCP on Tuesday with a bp 145/84 and PCP changed bp medication to a higher dose Follow up is on monday

## 2013-05-27 NOTE — ED Provider Notes (Signed)
CSN: [id number]     Arrival date & time 05/27/13  2000 History   First MD Initiated Contact with Patient 05/27/13 2100     Chief Complaint  Patient presents with  . Headache   (Consider location/radiation/quality/duration/timing/severity/associated sxs/prior Treatment) Patient is a 27 y.o. female presenting with headaches. The history is provided by the patient.  Headache Pain location:  Generalized Quality:  Dull Radiates to:  Does not radiate Chronicity:  New Similar to prior headaches: yes   Context: emotional stress   Context comment:  Under stress at college for end of semester. Associated symptoms: no blurred vision, no dizziness, no fever, no focal weakness, no loss of balance, no neck pain, no numbness, no paresthesias, no photophobia, no sore throat and no weakness     History reviewed. No pertinent past medical history. No past surgical history on file. No family history on file. History  Substance Use Topics  . Smoking status: Not on file  . Smokeless tobacco: Not on file  . Alcohol Use: Not on file   OB History   Grav Para Term Preterm Abortions TAB SAB Ect Mult Living                 Review of Systems  Constitutional: Negative.  Negative for fever.  HENT: Negative for sore throat.   Eyes: Negative for blurred vision and photophobia.  Musculoskeletal: Negative for neck pain.  Neurological: Positive for headaches. Negative for dizziness, focal weakness, weakness, light-headedness, numbness, paresthesias and loss of balance.  Psychiatric/Behavioral: Negative for confusion and dysphoric mood. The patient is nervous/anxious.     Allergies  Review of patient's allergies indicates no known allergies.  Home Medications   Current Outpatient Rx  Name  Route  Sig  Dispense  Refill  . clonazePAM (KLONOPIN) 1 MG tablet   Oral   Take 1 tablet (1 mg total) by mouth 2 (two) times daily. Prn anxiety   20 tablet   0    LMP 05/01/2013 Physical Exam  Nursing note  and vitals reviewed. Constitutional: She is oriented to person, place, and time. She appears well-developed and well-nourished.  HENT:  Head: Normocephalic.  Eyes: Conjunctivae and EOM are normal. Pupils are equal, round, and reactive to light.  Neck: Normal range of motion. Neck supple.  Cardiovascular: Regular rhythm.   Musculoskeletal: Normal range of motion.  Lymphadenopathy:    She has no cervical adenopathy.  Neurological: She is alert and oriented to person, place, and time. No cranial nerve deficit.  Skin: Skin is warm and dry.    ED Course  Procedures (including critical care time) Labs Review Labs Reviewed - No data to display Imaging Review No results found.  EKG Interpretation    Date/Time:    Ventricular Rate:    PR Interval:    QRS Duration:   QT Interval:    QTC Calculation:   R Axis:     Text Interpretation:              MDM      Billy Fischer, MD 05/27/13 2108

## 2013-05-31 DIAGNOSIS — Z940 Kidney transplant status: Secondary | ICD-10-CM | POA: Diagnosis not present

## 2013-05-31 DIAGNOSIS — Z79899 Other long term (current) drug therapy: Secondary | ICD-10-CM | POA: Diagnosis not present

## 2013-06-11 ENCOUNTER — Encounter (HOSPITAL_COMMUNITY): Payer: Self-pay | Admitting: *Deleted

## 2013-06-11 ENCOUNTER — Inpatient Hospital Stay (HOSPITAL_COMMUNITY)
Admission: AD | Admit: 2013-06-11 | Discharge: 2013-06-11 | Disposition: A | Discharge status: Home / Self Care | Payer: Medicare Other | Source: Ambulatory Visit | Attending: Obstetrics and Gynecology | Admitting: Obstetrics and Gynecology

## 2013-06-11 DIAGNOSIS — Z940 Kidney transplant status: Secondary | ICD-10-CM | POA: Insufficient documentation

## 2013-06-11 DIAGNOSIS — Z3202 Encounter for pregnancy test, result negative: Secondary | ICD-10-CM

## 2013-06-11 DIAGNOSIS — N898 Other specified noninflammatory disorders of vagina: Secondary | ICD-10-CM | POA: Insufficient documentation

## 2013-06-11 DIAGNOSIS — R51 Headache: Secondary | ICD-10-CM | POA: Insufficient documentation

## 2013-06-11 HISTORY — DX: Kidney transplant status: Z94.0

## 2013-06-11 HISTORY — DX: Essential (primary) hypertension: I10

## 2013-06-11 LAB — URINE MICROSCOPIC-ADD ON

## 2013-06-11 LAB — URINALYSIS, ROUTINE W REFLEX MICROSCOPIC
Bilirubin Urine: NEGATIVE
Glucose, UA: NEGATIVE mg/dL
Protein, ur: 100 mg/dL — AB

## 2013-06-11 LAB — POCT PREGNANCY, URINE: Preg Test, Ur: NEGATIVE

## 2013-06-11 LAB — CBC
HCT: 40.8 % (ref 36.0–46.0)
Hemoglobin: 13.6 g/dL (ref 12.0–15.0)
WBC: 8.7 10*3/uL (ref 4.0–10.5)

## 2013-06-11 NOTE — MAU Note (Signed)
Patient states she has had a positive home pregnancy test. States she had a kidney transplant 2 years ago. States she has been having headaches and a vaginal discharge for weeks. Has had nausea for 2 days, no vomiting. Denies bleeding.

## 2013-06-11 NOTE — MAU Provider Note (Signed)
History     CSN: [id number]  Arrival date and time: 06/11/13 1226   First Provider Initiated Contact with Patient 06/11/13 1354      Chief Complaint  Patient presents with  . Possible Pregnancy  . Headache  . Vaginal Discharge  . Nausea   HPI  [name] is a 27 y.o. female G0 who presents for a pregnancy test and confirmation. Pt had a positive pregnancy test yesterday. Pt also complains of white discharge, no odor. Pt feels pregnant including nausea and headache, pt has a history of headaches and takes tylenol. Pt denies pain at this time 0/10.  Pt has a history of known kidney disease. Pregnancy test here in MAU was negative.   OB History   Grav Para Term Preterm Abortions TAB SAB Ect Mult Living                  Past Medical History  Diagnosis Date  . Hypertension   . 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 Kidney transplant Bilateral 2012    History reviewed. No pertinent family history.  History  Substance Use Topics  . Smoking status: Never Smoker   . Smokeless tobacco: Never Used  . Alcohol Use: No    Allergies: No Known Allergies  Prescriptions prior to admission  Medication Sig Dispense Refill  . labetalol (NORMODYNE) 300 MG tablet Take 300 mg by mouth 3 (three) times daily.      . magnesium oxide (MAG-OX) 400 MG tablet Take 400 mg by mouth daily.      Marland Kitchen omeprazole (PRILOSEC) 20 MG capsule Take 20 mg by mouth daily.      . sodium bicarbonate 650 MG tablet Take 1,300 mg by mouth 2 (two) times daily.      Marland Kitchen sulfamethoxazole-trimethoprim (BACTRIM DS) 800-160 MG per tablet Take 1 tablet by mouth 3 (three) times a week. Mon, wed, fri      . tacrolimus (PROGRAF) 1 MG capsule Take 4 mg by mouth 2 (two) times daily.       Results for orders placed during the hospital encounter of 06/11/13 (from the past 24 hour(s))  URINALYSIS, ROUTINE W REFLEX MICROSCOPIC      Status: Abnormal   Collection Time    06/11/13 12:47 PM      Result Value Range   Color, Urine YELLOW  YELLOW   APPearance CLEAR  CLEAR   Specific Gravity, Urine 1.025  1.005 - 1.030   pH 7.0  5.0 - 8.0   Glucose, UA NEGATIVE  NEGATIVE mg/dL   Hgb urine dipstick LARGE (*) NEGATIVE   Bilirubin Urine NEGATIVE  NEGATIVE   Ketones, ur NEGATIVE  NEGATIVE mg/dL   Protein, ur 100 (*) NEGATIVE mg/dL   Urobilinogen, UA 0.2  0.0 - 1.0 mg/dL   Nitrite NEGATIVE  NEGATIVE   Leukocytes, UA SMALL (*) NEGATIVE  URINE MICROSCOPIC-ADD ON     Status: None   Collection Time    06/11/13 12:47 PM      Result Value Range   Squamous Epithelial / LPF RARE  RARE   WBC, UA 3-6  <3 WBC/hpf   RBC / HPF 21-50  <3 RBC/hpf   Bacteria, UA RARE  RARE  POCT PREGNANCY, URINE     Status: None   Collection Time    06/11/13 12:58 PM      Result Value Range  Preg Test, Ur NEGATIVE  NEGATIVE  CBC     Status: Abnormal   Collection Time    06/11/13  1:16 PM      Result Value Range   WBC 8.7  4.0 - 10.5 K/uL   RBC 4.32  3.87 - 5.11 MIL/uL   Hemoglobin 13.6  12.0 - 15.0 g/dL   HCT 40.8  36.0 - 46.0 %   MCV 94.4  78.0 - 100.0 fL   MCH 31.5  26.0 - 34.0 pg   MCHC 33.3  30.0 - 36.0 g/dL   RDW 15.9 (*) 11.5 - 15.5 %   Platelets 217  150 - 400 K/uL  COMPREHENSIVE METABOLIC PANEL     Status: Abnormal   Collection Time    06/11/13  1:16 PM      Result Value Range   Sodium 135  135 - 145 mEq/L   Potassium 4.1  3.5 - 5.1 mEq/L   Chloride 103  96 - 112 mEq/L   CO2 24  19 - 32 mEq/L   Glucose, Bld 97  70 - 99 mg/dL   BUN 26 (*) 6 - 23 mg/dL   Creatinine, Ser 1.94 (*) 0.50 - 1.10 mg/dL   Calcium 10.0  8.4 - 10.5 mg/dL   Total Protein 7.6  6.0 - 8.3 g/dL   Albumin 3.9  3.5 - 5.2 g/dL   AST 12  0 - 37 U/L   ALT 8  0 - 35 U/L   Alkaline Phosphatase 73  39 - 117 U/L   Total Bilirubin <0.1 (*) 0.3 - 1.2 mg/dL   GFR calc non Af Amer 35 (*) >90 mL/min   GFR calc Af Amer 40 (*) >90 mL/min  HCG, QUANTITATIVE,  PREGNANCY     Status: None   Collection Time    06/11/13  1:17 PM      Result Value Range   hCG, Beta Chain, Quant, S <1  <5 mIU/mL  WET PREP, GENITAL     Status: Abnormal   Collection Time    06/11/13  2:02 PM      Result Value Range   Yeast Wet Prep HPF POC NONE SEEN  NONE SEEN   Trich, Wet Prep NONE SEEN  NONE SEEN   Clue Cells Wet Prep HPF POC NONE SEEN  NONE SEEN   WBC, Wet Prep HPF POC FEW (*) NONE SEEN    Review of Systems  Constitutional: Negative for fever and chills.  Gastrointestinal: Positive for nausea. Negative for vomiting, abdominal pain, diarrhea and constipation.  Genitourinary: Negative for dysuria, urgency, frequency and hematuria.       + vaginal discharge; white. No vaginal bleeding. No dysuria.    Physical Exam   Blood pressure 137/92, pulse 85, resp. rate 16, height 5\' 1"  (1.549 m), weight 70.126 kg (154 lb 9.6 oz), last menstrual period 04/30/2013, SpO2 99.00%.  Physical Exam  Constitutional: She is oriented to person, place, and time. She appears well-developed and well-nourished. No distress.  HENT:  Head: Normocephalic.  Eyes: Pupils are equal, round, and reactive to light.  Neck: Neck supple.  Respiratory: Effort normal.  GI: Soft. She exhibits no distension. There is no tenderness. There is no rebound and no guarding.  Genitourinary:  Speculum exam: Vagina - Small amount of creamy discharge, no odor Cervix - No contact bleeding Bimanual exam: Cervix closed Uterus non tender, normal size Adnexa non tender, no masses bilaterally GC/Chlam, wet prep done Chaperone present for exam.   Neurological:  She is alert and oriented to person, place, and time.  Skin: Skin is warm. She is not diaphoretic.    MAU Course  Procedures None  MDM Wet prep GC/Chlamydia Urine pregnancy test- negative Beta Hcg- negative CBC  CMP  Assessment and Plan   A: 1. Pregnancy examination or test, negative result    P: Discharge home Return to MAU as  needed, if symptoms worsen  Dajion Bickford IRENE NP  06/11/2013, 2:38 PM

## 2013-06-14 LAB — HCG, QUANTITATIVE, PREGNANCY: hCG, Beta Chain, Quant, S: <1 m[IU]/mL (ref <5)

## 2013-06-14 LAB — COMPREHENSIVE METABOLIC PANEL
Alkaline Phosphatase: 73 U/L (ref 39–117)
BUN: 26 mg/dL — ABNORMAL HIGH (ref 6–23)
Chloride: 103 mEq/L (ref 96–112)
GFR calc Af Amer: 40 mL/min — ABNORMAL LOW (ref >90)
GFR calc non Af Amer: 35 mL/min — ABNORMAL LOW (ref >90)
Glucose, Bld: 97 mg/dL (ref 70–99)
Potassium: 4.1 mEq/L (ref 3.5–5.1)
Total Bilirubin: <0.1 mg/dL — ABNORMAL LOW (ref 0.3–1.2)

## 2013-06-14 NOTE — MAU Provider Note (Signed)
Attestation of Attending Supervision of Advanced Practitioner (CNM/NP): Evaluation and management procedures were performed by the Advanced Practitioner under my supervision and collaboration.  I have reviewed the Advanced Practitioner's note and chart, and I agree with the management and plan.  Ailed Defibaugh 06/14/2013 9:27 AM

## 2013-06-29 DIAGNOSIS — I10 Essential (primary) hypertension: Secondary | ICD-10-CM | POA: Diagnosis not present

## 2013-06-29 DIAGNOSIS — Z940 Kidney transplant status: Secondary | ICD-10-CM | POA: Diagnosis not present

## 2013-06-29 DIAGNOSIS — N135 Crossing vessel and stricture of ureter without hydronephrosis: Secondary | ICD-10-CM | POA: Diagnosis not present

## 2013-06-29 DIAGNOSIS — Z466 Encounter for fitting and adjustment of urinary device: Secondary | ICD-10-CM | POA: Diagnosis not present

## 2013-06-29 DIAGNOSIS — T8610 Unspecified complication of kidney transplant: Secondary | ICD-10-CM | POA: Diagnosis not present

## 2013-06-29 DIAGNOSIS — K219 Gastro-esophageal reflux disease without esophagitis: Secondary | ICD-10-CM | POA: Diagnosis not present

## 2013-06-29 DIAGNOSIS — D509 Iron deficiency anemia, unspecified: Secondary | ICD-10-CM | POA: Diagnosis not present

## 2013-06-29 DIAGNOSIS — Z87891 Personal history of nicotine dependence: Secondary | ICD-10-CM | POA: Diagnosis not present

## 2013-07-10 ENCOUNTER — Encounter (HOSPITAL_COMMUNITY): Payer: Self-pay | Admitting: Emergency Medicine

## 2013-07-10 ENCOUNTER — Emergency Department (HOSPITAL_COMMUNITY)
Admission: EM | Admit: 2013-07-10 | Discharge: 2013-07-10 | Disposition: A | Discharge status: Home / Self Care | Payer: Medicare Other | Attending: Emergency Medicine | Admitting: Emergency Medicine

## 2013-07-10 DIAGNOSIS — Z940 Kidney transplant status: Secondary | ICD-10-CM | POA: Insufficient documentation

## 2013-07-10 DIAGNOSIS — Z87448 Personal history of other diseases of urinary system: Secondary | ICD-10-CM | POA: Insufficient documentation

## 2013-07-10 DIAGNOSIS — B309 Viral conjunctivitis, unspecified: Secondary | ICD-10-CM | POA: Insufficient documentation

## 2013-07-10 DIAGNOSIS — H1030 Unspecified acute conjunctivitis, unspecified eye: Secondary | ICD-10-CM | POA: Diagnosis not present

## 2013-07-10 DIAGNOSIS — I10 Essential (primary) hypertension: Secondary | ICD-10-CM | POA: Insufficient documentation

## 2013-07-10 DIAGNOSIS — Z79899 Other long term (current) drug therapy: Secondary | ICD-10-CM | POA: Insufficient documentation

## 2013-07-10 NOTE — ED Provider Notes (Signed)
CSN: [id number]     Arrival date & time 07/10/13  1954 History  This chart was scribed for Hyman Bible, PA, working with Saddie Benders. Dorna Mai, MD, by Elby Beck ED Scribe. This patient was seen in room TR04C/TR04C and the patient's care was started at 8:21 PM.    Chief Complaint  Patient presents with  . Eye Problem    The history is provided by the patient. No language interpreter was used.    HPI Comments: Kerry Cook is a 28 y.o. female who presents to the Emergency Department complaining of left eye redness onset this morning. She also states that her left eye has felt irritated and sore to the touch since she woke up this morning. She denies any injury or trauma to her eye. She also denies any known foreign body presence in the eye. She states that she does not wear contacts or corrective lenses. She denies fever, chills, eye discharge, eye itching, visual disturbances, photophobia or any other symptoms.   Past Medical History  Diagnosis Date  . Hypertension   . 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 Kidney transplant Bilateral 2012   No family history on file. History  Substance Use Topics  . Smoking status: Never Smoker   . Smokeless tobacco: Never Used  . Alcohol Use: No   OB History   Grav Para Term Preterm Abortions TAB SAB Ect Mult Living                 Review of Systems  Constitutional: Negative for fever and chills.  Eyes: Positive for redness (left). Negative for photophobia, discharge, itching and visual disturbance.  All other systems reviewed and are negative.   Allergies  Review of patient's allergies indicates no known allergies.  Home Medications   Current Outpatient Rx  Name  Route  Sig  Dispense  Refill  . labetalol (NORMODYNE) 300 MG tablet   Oral   Take 300 mg by mouth 3 (three) times daily.         . magnesium oxide (MAG-OX) 400 MG tablet    Oral   Take 400 mg by mouth daily.         Marland Kitchen omeprazole (PRILOSEC) 20 MG capsule   Oral   Take 20 mg by mouth daily.         . sodium bicarbonate 650 MG tablet   Oral   Take 1,300 mg by mouth 2 (two) times daily.         Marland Kitchen sulfamethoxazole-trimethoprim (BACTRIM DS) 800-160 MG per tablet   Oral   Take 1 tablet by mouth 3 (three) times a week. Mon, wed, fri         . tacrolimus (PROGRAF) 1 MG capsule   Oral   Take 4 mg by mouth 2 (two) times daily.          Triage Vitals: BP 147/103  Pulse 94  Temp(Src) 98.1 F (36.7 C) (Oral)  Resp 16  SpO2 100%  LMP 07/10/2013  Physical Exam  Nursing note and vitals reviewed. Constitutional: She is oriented to person, place, and time. She appears well-developed and well-nourished. No distress.  HENT:  Head: Normocephalic and atraumatic.  Eyes: EOM and lids are normal. Pupils are equal, round, and reactive to light. Lids are everted and swept, no foreign bodies found. Right eye exhibits no discharge and no exudate. No foreign body  present in the right eye. Left eye exhibits no discharge and no exudate. No foreign body present in the left eye.  No periorbital edema or erythema. Sclera is mildly injected on the left . No discharge. No foreign bodies.  Neck: Neck supple. No tracheal deviation present.  Cardiovascular: Normal rate, regular rhythm and normal heart sounds.   Pulmonary/Chest: Effort normal and breath sounds normal. No respiratory distress.  Musculoskeletal: Normal range of motion.  Neurological: She is alert and oriented to person, place, and time.  Skin: Skin is warm and dry.  Psychiatric: She has a normal mood and affect. Her behavior is normal.    ED Course  Procedures (including critical care time)  DIAGNOSTIC STUDIES: Oxygen Saturation is 100% on RA, normal by my interpretation.    COORDINATION OF CARE: 8:26 PM- Discussed that pt may have viral conjunctivitis. Advised pt to irrigate her eyes with water at  home in case there is an irritant in her eye. Advised return precautions. Pt advised of plan for treatment and pt agrees.  Labs Review Labs Reviewed - No data to display Imaging Review No results found.  EKG Interpretation   None       MDM   1. Viral conjunctivitis    Patient with symptoms most consistent with Viral Conjunctivitis or eye irritation.  No discharge.  Normal vision.  No eye pain.  No injury or trauma.  Feel that the patient is stable for discharge.  Return precautions given.  I personally performed the services described in this documentation, which was scribed in my presence. The recorded information has been reviewed and is accurate.    Hyman Bible, PA-C 07/10/13 2050

## 2013-07-10 NOTE — ED Notes (Signed)
Patient with eye irritation in left eye.  Patient states that she woke up this morning with red left eye.  Patient states that the eye does feel sore, denies any drainage from eye.

## 2013-07-10 NOTE — ED Notes (Signed)
Pt reports waking this am with redness to left eye. Denies drainage, pain, photophobia, or blurry vision.

## 2013-07-10 NOTE — Discharge Instructions (Signed)
Viral Conjunctivitis Conjunctivitis is an irritation (inflammation) of the clear membrane that covers the white part of the eye (the conjunctiva). The irritation can also happen on the underside of the eyelids. Conjunctivitis makes the eye red or pink in color. This is what is commonly known as pink eye. Viral conjunctivitis can spread easily (contagious). CAUSES   Infection from virus on the surface of the eye.  Infection from the irritation or injury of nearby tissues such as the eyelids or cornea.  More serious inflammation or infection on the inside of the eye.  Other eye diseases.  The use of certain eye medications. SYMPTOMS  The normally white color of the eye or the underside of the eyelid is usually pink or red in color. The pink eye is usually associated with irritation, tearing and some sensitivity to light. Viral conjunctivitis is often associated with a clear, watery discharge. If a discharge is present, there may also be some blurred vision in the affected eye. DIAGNOSIS  Conjunctivitis is diagnosed by an eye exam. The eye specialist looks for changes in the surface tissues of the eye which take on changes characteristic of the specific types of conjunctivitis. A sample of any discharge may be collected on a Q-Tip (sterile swap). The sample will be sent to a lab to see whether or not the inflammation is caused by bacterial or viral infection. TREATMENT  Viral conjunctivitis will not respond to medicines that kill germs (antibiotics). Treatment is aimed at stopping a bacterial infection on top of the viral infection. The goal of treatment is to relieve symptoms (such as itching) with antihistamine drops or other eye medications.  HOME CARE INSTRUCTIONS   To ease discomfort, apply a cool, clean wash cloth to your eye for 10 to 20 minutes, 3 to 4 times a day.  Gently wipe away any drainage from the eye with a warm, wet washcloth or a cotton ball.  Wash your hands often with soap  and use paper towels to dry.  Do not share towels or washcloths. This may spread the infection.  Change or wash your pillowcase every day.  You should not use eye make-up until the infection is gone.  Stop using contacts lenses. Ask your eye professional how to sterilize or replace them before using again. This depends on the type of contact lenses used.  Do not touch the edge of the eyelid with the eye drop bottle or ointment tube when applying medications to the affected eye. This will stop you from spreading the infection to the other eye or to others. SEEK IMMEDIATE MEDICAL CARE IF:   The infection has not improved within 3 days of beginning treatment.  A watery discharge from the eye develops.  Pain in the eye increases.  The redness is spreading.  Vision becomes blurred.  An oral temperature above 102 F (38.9 C) develops, or as your caregiver suggests.  Facial pain, redness or swelling develops.  Any problems that may be related to the prescribed medicine develop. MAKE SURE YOU:   Understand these instructions.  Will watch your condition.  Will get help right away if you are not doing well or get worse. Document Released: 06/10/2005 Document Revised: 09/02/2011 Document Reviewed: 01/28/2008 [facility] Patient Information 2014 [place].

## 2013-07-11 NOTE — ED Provider Notes (Signed)
Medical screening examination/treatment/procedure(s) were performed by non-physician practitioner and as supervising physician I was immediately available for consultation/collaboration.  EKG Interpretation   None         Saddie Benders. Dorna Mai, MD 07/11/13 [id number]

## 2013-07-12 ENCOUNTER — Encounter (HOSPITAL_COMMUNITY): Payer: Self-pay | Admitting: Emergency Medicine

## 2013-07-12 ENCOUNTER — Emergency Department (INDEPENDENT_AMBULATORY_CARE_PROVIDER_SITE_OTHER)
Admission: EM | Admit: 2013-07-12 | Discharge: 2013-07-12 | Disposition: A | Discharge status: Home / Self Care | Payer: Medicare Other | Source: Home / Self Care | Attending: Family Medicine | Admitting: Family Medicine

## 2013-07-12 DIAGNOSIS — H5789 Other specified disorders of eye and adnexa: Secondary | ICD-10-CM

## 2013-07-12 MED ORDER — TETRACAINE HCL 0.5 % OP SOLN
OPHTHALMIC | Status: AC
  Filled 2013-07-12: qty 2

## 2013-07-12 MED ORDER — TETRACAINE HCL 0.5 % OP SOLN
OPHTHALMIC | Status: AC
Start: 2013-07-12 — End: 2013-07-12
  Filled 2013-07-12: qty 2

## 2013-07-12 NOTE — ED Provider Notes (Signed)
CSN: [id number]     Arrival date & time 07/12/13  1636 History   First MD Initiated Contact with Patient 07/12/13 1816     Chief Complaint  Patient presents with  . Conjunctivitis   (Consider location/radiation/quality/duration/timing/severity/associated sxs/prior Treatment) HPI Comments: Pt woke up morning of 1/17 with red L eye. Seen in ER for same 1/17, told was viral pink eye. Evening of 1/18, began having blurry vision in L eye. Watery discharge, eye crusted shut with brown discharge this morning. Pt does not wear contacts or glasses and has never had eye problems/injury/surgery.   Patient is a 28 y.o. female presenting with conjunctivitis. The history is provided by the patient.  Conjunctivitis This is a new problem. The current episode started 2 days ago. The problem occurs constantly. The problem has been gradually worsening. Nothing aggravates the symptoms. Nothing relieves the symptoms. Treatments tried: otc eye drops. The treatment provided no relief.    Past Medical History  Diagnosis Date  . Hypertension   . 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 Kidney transplant Bilateral 2012   No family history on file. History  Substance Use Topics  . Smoking status: Never Smoker   . Smokeless tobacco: Never Used  . Alcohol Use: No   OB History   Grav Para Term Preterm Abortions TAB SAB Ect Mult Living                 Review of Systems  Constitutional: Negative for fever and chills.  HENT: Negative for congestion.   Eyes: Positive for discharge, redness, itching and visual disturbance. Negative for photophobia.       Denies eye pain but c/o eye irritation/discomfort    Allergies  Review of patient's allergies indicates no known allergies.  Home Medications   Current Outpatient Rx  Name  Route  Sig  Dispense  Refill  . labetalol (NORMODYNE) 300 MG tablet   Oral   Take 300 mg by  mouth 3 (three) times daily.         . magnesium oxide (MAG-OX) 400 MG tablet   Oral   Take 400 mg by mouth daily.         Marland Kitchen omeprazole (PRILOSEC) 20 MG capsule   Oral   Take 20 mg by mouth daily.         . sodium bicarbonate 650 MG tablet   Oral   Take 1,300 mg by mouth 2 (two) times daily.         Marland Kitchen sulfamethoxazole-trimethoprim (BACTRIM DS) 800-160 MG per tablet   Oral   Take 1 tablet by mouth 3 (three) times a week. Mon, wed, fri         . tacrolimus (PROGRAF) 1 MG capsule   Oral   Take 4 mg by mouth 2 (two) times daily.          BP 128/84  Pulse 90  Temp(Src) 98.8 F (37.1 C) (Oral)  Resp 18  SpO2 100%  LMP 07/12/2013 Physical Exam  Constitutional: She appears well-developed and well-nourished. No distress.  Eyes: EOM and lids are normal. Pupils are equal, round, and reactive to light. Lids are everted and swept, no foreign bodies found. Right eye exhibits no discharge and no exudate. No foreign body present in the right eye. Left eye exhibits no discharge and no exudate. No foreign body present in the left eye. Right conjunctiva is  not injected. Right conjunctiva has no hemorrhage. Left conjunctiva is injected. Left conjunctiva has no hemorrhage.  Slit lamp exam:      The left eye shows no corneal abrasion, no corneal flare, no corneal ulcer, no foreign body and no fluorescein uptake.  Visual acuity: L =20/100, R=20/40, B=20/40.  tonopen measurements of IOP: L eye 26, R eye 23.      ED Course  Procedures (including critical care time) Labs Review Labs Reviewed - No data to display Imaging Review No results found.  EKG Interpretation    Date/Time:    Ventricular Rate:    PR Interval:    QRS Duration:   QT Interval:    QTC Calculation:   R Axis:     Text Interpretation:              MDM   1. Red eye   Discussed pt with Dr. Satira Sark with ophthamology.  He will see pt in office tomorrow for f/u.      Carvel Getting, NP 07/12/13  2002

## 2013-07-12 NOTE — ED Notes (Signed)
C/o redness and irritation of the left eye with drainage.  Some mild pain.  Blurred vision.  Pt has been using eye drops with no relief. Symptoms present since Saturday.

## 2013-07-12 NOTE — Discharge Instructions (Signed)
Blurred Vision You have been seen today complaining of blurred vision. This means you have a loss of ability to see small details.  CAUSES  Blurred vision can be a symptom of underlying eye problems, such as:  Aging of the eye (presbyopia).  Glaucoma.  Cataracts.  Eye infection.  Eye-related migraine.  Diabetes mellitus.  Fatigue.  Migraine headaches.  High blood pressure.  Breakdown of the back of the eye (macular degeneration).  Problems caused by some medications. The most common cause of blurred vision is the need for eyeglasses or a new prescription. Today in the emergency department, no cause for your blurred vision can be found. SYMPTOMS  Blurred vision is the loss of visual sharpness and detail (acuity). DIAGNOSIS  Should blurred vision continue, you should see your caregiver. If your caregiver is your primary care physician, he or she may choose to refer you to another specialist.  TREATMENT  Do not ignore your blurred vision. Make sure to have it checked out to see if further treatment or referral is necessary. SEEK MEDICAL CARE IF:  You are unable to get into a specialist so we can help you with a referral. SEEK IMMEDIATE MEDICAL CARE IF: You have severe eye pain, severe headache, or sudden loss of vision. MAKE SURE YOU:   Understand these instructions.  Will watch your condition.  Will get help right away if you are not doing well or get worse. Document Released: 06/13/2003 Document Revised: 09/02/2011 Document Reviewed: 01/13/2008 [facility] Patient Information 2014 [place].

## 2013-07-13 NOTE — ED Provider Notes (Signed)
Medical screening examination/treatment/procedure(s) were performed by a resident physician or non-physician practitioner and as the supervising physician I was immediately available for consultation/collaboration.  Lynne Leader, MD    Gregor Hams, MD 07/13/13 [phone]

## 2013-07-20 DIAGNOSIS — E872 Acidosis, unspecified: Secondary | ICD-10-CM | POA: Diagnosis not present

## 2013-07-20 DIAGNOSIS — T8610 Unspecified complication of kidney transplant: Secondary | ICD-10-CM | POA: Diagnosis not present

## 2013-07-20 DIAGNOSIS — Z792 Long term (current) use of antibiotics: Secondary | ICD-10-CM | POA: Diagnosis not present

## 2013-07-20 DIAGNOSIS — R894 Abnormal immunological findings in specimens from other organs, systems and tissues: Secondary | ICD-10-CM | POA: Diagnosis not present

## 2013-07-20 DIAGNOSIS — Z5189 Encounter for other specified aftercare: Secondary | ICD-10-CM | POA: Diagnosis not present

## 2013-07-20 DIAGNOSIS — Z79899 Other long term (current) drug therapy: Secondary | ICD-10-CM | POA: Diagnosis not present

## 2013-07-20 DIAGNOSIS — Z298 Encounter for other specified prophylactic measures: Secondary | ICD-10-CM | POA: Diagnosis not present

## 2013-07-20 DIAGNOSIS — Z9189 Other specified personal risk factors, not elsewhere classified: Secondary | ICD-10-CM | POA: Diagnosis not present

## 2013-07-20 DIAGNOSIS — D899 Disorder involving the immune mechanism, unspecified: Secondary | ICD-10-CM | POA: Diagnosis not present

## 2013-07-20 DIAGNOSIS — IMO0002 Reserved for concepts with insufficient information to code with codable children: Secondary | ICD-10-CM | POA: Diagnosis not present

## 2013-07-20 DIAGNOSIS — Z48298 Encounter for aftercare following other organ transplant: Secondary | ICD-10-CM | POA: Diagnosis not present

## 2013-07-20 DIAGNOSIS — R300 Dysuria: Secondary | ICD-10-CM | POA: Diagnosis not present

## 2013-07-20 DIAGNOSIS — Z940 Kidney transplant status: Secondary | ICD-10-CM | POA: Diagnosis not present

## 2013-07-23 DIAGNOSIS — R3989 Other symptoms and signs involving the genitourinary system: Secondary | ICD-10-CM | POA: Diagnosis not present

## 2013-07-23 DIAGNOSIS — Z940 Kidney transplant status: Secondary | ICD-10-CM | POA: Diagnosis not present

## 2013-07-23 DIAGNOSIS — N390 Urinary tract infection, site not specified: Secondary | ICD-10-CM | POA: Diagnosis not present

## 2013-07-23 DIAGNOSIS — R3915 Urgency of urination: Secondary | ICD-10-CM | POA: Diagnosis not present

## 2013-07-23 DIAGNOSIS — D899 Disorder involving the immune mechanism, unspecified: Secondary | ICD-10-CM | POA: Diagnosis not present

## 2013-07-27 DIAGNOSIS — Z940 Kidney transplant status: Secondary | ICD-10-CM | POA: Diagnosis not present

## 2013-07-27 DIAGNOSIS — Z79899 Other long term (current) drug therapy: Secondary | ICD-10-CM | POA: Diagnosis not present

## 2013-07-30 DIAGNOSIS — Z940 Kidney transplant status: Secondary | ICD-10-CM | POA: Diagnosis not present

## 2013-07-30 DIAGNOSIS — Z48298 Encounter for aftercare following other organ transplant: Secondary | ICD-10-CM | POA: Diagnosis not present

## 2013-08-05 DIAGNOSIS — I129 Hypertensive chronic kidney disease with stage 1 through stage 4 chronic kidney disease, or unspecified chronic kidney disease: Secondary | ICD-10-CM | POA: Diagnosis not present

## 2013-08-05 DIAGNOSIS — N139 Obstructive and reflux uropathy, unspecified: Secondary | ICD-10-CM | POA: Diagnosis not present

## 2013-08-05 DIAGNOSIS — N183 Chronic kidney disease, stage 3 unspecified: Secondary | ICD-10-CM | POA: Diagnosis not present

## 2013-08-05 DIAGNOSIS — Z940 Kidney transplant status: Secondary | ICD-10-CM | POA: Diagnosis not present

## 2013-08-13 DIAGNOSIS — Z48298 Encounter for aftercare following other organ transplant: Secondary | ICD-10-CM | POA: Diagnosis not present

## 2013-08-13 DIAGNOSIS — D899 Disorder involving the immune mechanism, unspecified: Secondary | ICD-10-CM | POA: Diagnosis not present

## 2013-08-13 DIAGNOSIS — Z940 Kidney transplant status: Secondary | ICD-10-CM | POA: Diagnosis not present

## 2013-08-13 DIAGNOSIS — Z79899 Other long term (current) drug therapy: Secondary | ICD-10-CM | POA: Diagnosis not present

## 2013-08-18 DIAGNOSIS — N183 Chronic kidney disease, stage 3 unspecified: Secondary | ICD-10-CM | POA: Diagnosis not present

## 2013-08-18 DIAGNOSIS — E785 Hyperlipidemia, unspecified: Secondary | ICD-10-CM | POA: Diagnosis not present

## 2013-08-18 DIAGNOSIS — N039 Chronic nephritic syndrome with unspecified morphologic changes: Secondary | ICD-10-CM | POA: Diagnosis not present

## 2013-08-18 DIAGNOSIS — Z940 Kidney transplant status: Secondary | ICD-10-CM | POA: Diagnosis not present

## 2013-08-18 DIAGNOSIS — N2581 Secondary hyperparathyroidism of renal origin: Secondary | ICD-10-CM | POA: Diagnosis not present

## 2013-08-18 DIAGNOSIS — D631 Anemia in chronic kidney disease: Secondary | ICD-10-CM | POA: Diagnosis not present

## 2013-09-03 DIAGNOSIS — T8610 Unspecified complication of kidney transplant: Secondary | ICD-10-CM | POA: Diagnosis not present

## 2013-09-03 DIAGNOSIS — N1330 Unspecified hydronephrosis: Secondary | ICD-10-CM | POA: Diagnosis not present

## 2013-09-03 DIAGNOSIS — Z9489 Other transplanted organ and tissue status: Secondary | ICD-10-CM | POA: Diagnosis not present

## 2013-09-09 DIAGNOSIS — I10 Essential (primary) hypertension: Secondary | ICD-10-CM | POA: Diagnosis not present

## 2013-09-09 DIAGNOSIS — Z940 Kidney transplant status: Secondary | ICD-10-CM | POA: Diagnosis not present

## 2013-09-09 DIAGNOSIS — Z792 Long term (current) use of antibiotics: Secondary | ICD-10-CM | POA: Diagnosis not present

## 2013-09-09 DIAGNOSIS — Z48298 Encounter for aftercare following other organ transplant: Secondary | ICD-10-CM | POA: Diagnosis not present

## 2013-09-09 DIAGNOSIS — T8610 Unspecified complication of kidney transplant: Secondary | ICD-10-CM | POA: Diagnosis not present

## 2013-09-09 DIAGNOSIS — N135 Crossing vessel and stricture of ureter without hydronephrosis: Secondary | ICD-10-CM | POA: Diagnosis not present

## 2013-09-09 DIAGNOSIS — Z79899 Other long term (current) drug therapy: Secondary | ICD-10-CM | POA: Diagnosis not present

## 2013-09-09 DIAGNOSIS — D899 Disorder involving the immune mechanism, unspecified: Secondary | ICD-10-CM | POA: Diagnosis not present

## 2013-09-09 DIAGNOSIS — Z5181 Encounter for therapeutic drug level monitoring: Secondary | ICD-10-CM | POA: Diagnosis not present

## 2013-10-11 ENCOUNTER — Encounter (HOSPITAL_COMMUNITY): Payer: Self-pay | Admitting: Emergency Medicine

## 2013-10-11 ENCOUNTER — Emergency Department (INDEPENDENT_AMBULATORY_CARE_PROVIDER_SITE_OTHER)
Admission: EM | Admit: 2013-10-11 | Discharge: 2013-10-11 | Disposition: A | Discharge status: Home / Self Care | Payer: Medicare Other | Source: Home / Self Care

## 2013-10-11 DIAGNOSIS — R519 Headache, unspecified: Secondary | ICD-10-CM

## 2013-10-11 DIAGNOSIS — R51 Headache: Secondary | ICD-10-CM | POA: Diagnosis not present

## 2013-10-11 MED ORDER — ONDANSETRON HCL 4 MG PO TABS: 4.0000 mg | ORAL_TABLET | Freq: Four times a day (QID) | ORAL | Status: DC

## 2013-10-11 MED ORDER — KETOROLAC TROMETHAMINE 30 MG/ML IJ SOLN
30.0000 mg | Freq: Once | INTRAMUSCULAR | Status: AC
  Administered 2013-10-11: 30 mg via INTRAMUSCULAR

## 2013-10-11 MED ORDER — ONDANSETRON 4 MG PO TBDP
4.0000 mg | ORAL_TABLET | Freq: Once | ORAL | Status: AC
  Administered 2013-10-11: 4 mg via ORAL

## 2013-10-11 MED ORDER — DEXAMETHASONE SODIUM PHOSPHATE 10 MG/ML IJ SOLN
5.0000 mg | Freq: Once | INTRAMUSCULAR | Status: AC
  Administered 2013-10-11: 5 mg via INTRAMUSCULAR

## 2013-10-11 MED ORDER — HYDROCODONE-ACETAMINOPHEN 5-325 MG PO TABS: 1.0000 | ORAL_TABLET | ORAL | Status: DC | PRN

## 2013-10-11 MED ORDER — ONDANSETRON 4 MG PO TBDP
ORAL_TABLET | ORAL | Status: AC
  Filled 2013-10-11: qty 1

## 2013-10-11 MED ORDER — DEXAMETHASONE SODIUM PHOSPHATE 10 MG/ML IJ SOLN
INTRAMUSCULAR | Status: AC
  Filled 2013-10-11: qty 1

## 2013-10-11 MED ORDER — KETOROLAC TROMETHAMINE 30 MG/ML IJ SOLN
INTRAMUSCULAR | Status: AC
  Filled 2013-10-11: qty 1

## 2013-10-11 NOTE — Discharge Instructions (Signed)
Headaches, Frequently Asked Questions MIGRAINE HEADACHES Q: What is migraine? What causes it? How can I treat it? A: Generally, migraine headaches begin as a dull ache. Then they develop into a constant, throbbing, and pulsating pain. You may experience pain at the temples. You may experience pain at the front or back of one or both sides of the head. The pain is usually accompanied by a combination of:  Nausea.  Vomiting.  Sensitivity to light and noise. Some people (about 15%) experience an aura (see below) before an attack. The cause of migraine is believed to be chemical reactions in the brain. Treatment for migraine may include over-the-counter or prescription medications. It may also include self-help techniques. These include relaxation training and biofeedback.  Q: What is an aura? A: About 15% of people with migraine get an "aura". This is a sign of neurological symptoms that occur before a migraine headache. You may see wavy or jagged lines, dots, or flashing lights. You might experience tunnel vision or blind spots in one or both eyes. The aura can include visual or auditory hallucinations (something imagined). It may include disruptions in smell (such as strange odors), taste or touch. Other symptoms include:  Numbness.  A "pins and needles" sensation.  Difficulty in recalling or speaking the correct word. These neurological events may last as long as 60 minutes. These symptoms will fade as the headache begins. Q: What is a trigger? A: Certain physical or environmental factors can lead to or "trigger" a migraine. These include:  Foods.  Hormonal changes.  Weather.  Stress. It is important to remember that triggers are different for everyone. To help prevent migraine attacks, you need to figure out which triggers affect you. Keep a headache diary. This is a good way to track triggers. The diary will help you talk to your healthcare professional about your condition. Q: Does  weather affect migraines? A: Bright sunshine, hot, humid conditions, and drastic changes in barometric pressure may lead to, or "trigger," a migraine attack in some people. But studies have shown that weather does not act as a trigger for everyone with migraines. Q: What is the link between migraine and hormones? A: Hormones start and regulate many of your body's functions. Hormones keep your body in balance within a constantly changing environment. The levels of hormones in your body are unbalanced at times. Examples are during menstruation, pregnancy, or menopause. That can lead to a migraine attack. In fact, about three quarters of all women with migraine report that their attacks are related to the menstrual cycle.  Q: Is there an increased risk of stroke for migraine sufferers? A: The likelihood of a migraine attack causing a stroke is very remote. That is not to say that migraine sufferers cannot have a stroke associated with their migraines. In persons under age 53, the most common associated factor for stroke is migraine headache. But over the course of a person's normal life span, the occurrence of migraine headache may actually be associated with a reduced risk of dying from cerebrovascular disease due to stroke.  Q: What are acute medications for migraine? A: Acute medications are used to treat the pain of the headache after it has started. Examples over-the-counter medications, NSAIDs, ergots, and triptans.  Q: What are the triptans? A: Triptans are the newest class of abortive medications. They are specifically targeted to treat migraine. Triptans are vasoconstrictors. They moderate some chemical reactions in the brain. The triptans work on receptors in your brain. Triptans help  to restore the balance of a neurotransmitter called serotonin. Fluctuations in levels of serotonin are thought to be a main cause of migraine.  °Q: Are over-the-counter medications for migraine effective? °A:  Over-the-counter, or "OTC," medications may be effective in relieving mild to moderate pain and associated symptoms of migraine. But you should see your caregiver before beginning any treatment regimen for migraine.  °Q: What are preventive medications for migraine? °A: Preventive medications for migraine are sometimes referred to as "prophylactic" treatments. They are used to reduce the frequency, severity, and length of migraine attacks. Examples of preventive medications include antiepileptic medications, antidepressants, beta-blockers, calcium channel blockers, and NSAIDs (nonsteroidal anti-inflammatory drugs). °Q: Why are anticonvulsants used to treat migraine? °A: During the past few years, there has been an increased interest in antiepileptic drugs for the prevention of migraine. They are sometimes referred to as "anticonvulsants". Both epilepsy and migraine may be caused by similar reactions in the brain.  °Q: Why are antidepressants used to treat migraine? °A: Antidepressants are typically used to treat people with depression. They may reduce migraine frequency by regulating chemical levels, such as serotonin, in the brain.  °Q: What alternative therapies are used to treat migraine? °A: The term "alternative therapies" is often used to describe treatments considered outside the scope of conventional Western medicine. Examples of alternative therapy include acupuncture, acupressure, and yoga. Another common alternative treatment is herbal therapy. Some herbs are believed to relieve headache pain. Always discuss alternative therapies with your caregiver before proceeding. Some herbal products contain arsenic and other toxins. °TENSION HEADACHES °Q: What is a tension-type headache? What causes it? How can I treat it? °A: Tension-type headaches occur randomly. They are often the result of temporary stress, anxiety, fatigue, or anger. Symptoms include soreness in your temples, a tightening band-like sensation  around your head (a "vice-like" ache). Symptoms can also include a pulling feeling, pressure sensations, and contracting head and neck muscles. The headache begins in your forehead, temples, or the back of your head and neck. Treatment for tension-type headache may include over-the-counter or prescription medications. Treatment may also include self-help techniques such as relaxation training and biofeedback. °CLUSTER HEADACHES °Q: What is a cluster headache? What causes it? How can I treat it? °A: Cluster headache gets its name because the attacks come in groups. The pain arrives with little, if any, warning. It is usually on one side of the head. A tearing or bloodshot eye and a runny nose on the same side of the headache may also accompany the pain. Cluster headaches are believed to be caused by chemical reactions in the brain. They have been described as the most severe and intense of any headache type. Treatment for cluster headache includes prescription medication and oxygen. °SINUS HEADACHES °Q: What is a sinus headache? What causes it? How can I treat it? °A: When a cavity in the bones of the face and skull (a sinus) becomes inflamed, the inflammation will cause localized pain. This condition is usually the result of an allergic reaction, a tumor, or an infection. If your headache is caused by a sinus blockage, such as an infection, you will probably have a fever. An x-ray will confirm a sinus blockage. Your caregiver's treatment might include antibiotics for the infection, as well as antihistamines or decongestants.  °REBOUND HEADACHES °Q: What is a rebound headache? What causes it? How can I treat it? °A: A pattern of taking acute headache medications too often can lead to a condition known as "rebound headache."   A pattern of taking too much headache medication includes taking it more than 2 days per week or in excessive amounts. That means more than the label or a caregiver advises. With rebound  headaches, your medications not only stop relieving pain, they actually begin to cause headaches. Doctors treat rebound headache by tapering the medication that is being overused. Sometimes your caregiver will gradually substitute a different type of treatment or medication. Stopping may be a challenge. Regularly overusing a medication increases the potential for serious side effects. Consult a caregiver if you regularly use headache medications more than 2 days per week or more than the label advises. ADDITIONAL QUESTIONS AND ANSWERS Q: What is biofeedback? A: Biofeedback is a self-help treatment. Biofeedback uses special equipment to monitor your body's involuntary physical responses. Biofeedback monitors:  Breathing.  Pulse.  Heart rate.  Temperature.  Muscle tension.  Brain activity. Biofeedback helps you refine and perfect your relaxation exercises. You learn to control the physical responses that are related to stress. Once the technique has been mastered, you do not need the equipment any more. Q: Are headaches hereditary? A: Four out of five (80%) of people that suffer report a family history of migraine. Scientists are not sure if this is genetic or a family predisposition. Despite the uncertainty, a child has a 50% chance of having migraine if one parent suffers. The child has a 75% chance if both parents suffer.  Q: Can children get headaches? A: By the time they reach high school, most young people have experienced some type of headache. Many safe and effective approaches or medications can prevent a headache from occurring or stop it after it has begun.  Q: What type of doctor should I see to diagnose and treat my headache? A: Start with your primary caregiver. Discuss his or her experience and approach to headaches. Discuss methods of classification, diagnosis, and treatment. Your caregiver may decide to recommend you to a headache specialist, depending upon your symptoms or other  physical conditions. Having diabetes, allergies, etc., may require a more comprehensive and inclusive approach to your headache. The National Headache Foundation will provide, upon request, a list of [facility] physician members in your state. Document Released: 08/31/2003 Document Revised: 09/02/2011 Document Reviewed: 02/08/2008 [facility] Patient Information 2014 [place].  Pain of Unknown Etiology (Pain Without a Known Cause) You have come to your caregiver because of pain. Pain can occur in any part of the body. Often there is not a definite cause. If your laboratory (blood or urine) work was normal and X-rays or other studies were normal, your caregiver may treat you without knowing the cause of the pain. An example of this is the headache. Most headaches are diagnosed by taking a history. This means your caregiver asks you questions about your headaches. Your caregiver determines a treatment based on your answers. Usually testing done for headaches is normal. Often testing is not done unless there is no response to medications. Regardless of where your pain is located today, you can be given medications to make you comfortable. If no physical cause of pain can be found, most cases of pain will gradually leave as suddenly as they came.  If you have a painful condition and no reason can be found for the pain, it is important that you follow up with your caregiver. If the pain becomes worse or does not go away, it may be necessary to repeat tests and look further for a possible cause.  Only take over-the-counter  or prescription medicines for pain, discomfort, or fever as directed by your caregiver.  For the protection of your privacy, test results cannot be given over the phone. Make sure you receive the results of your test. Ask how these results are to be obtained if you have not been informed. It is your responsibility to obtain your test results.  You may continue all activities unless the  activities cause more pain. When the pain lessens, it is important to gradually resume normal activities. Resume activities by beginning slowly and gradually increasing the intensity and duration of the activities or exercise. During periods of severe pain, bed rest may be helpful. Lie or sit in any position that is comfortable. Migraine Headache A migraine headache is an intense, throbbing pain on one or both sides of your head. A migraine can last for 30 minutes to several hours. CAUSES  The exact cause of a migraine headache is not always known. However, a migraine may be caused when nerves in the brain become irritated and release chemicals that cause inflammation. This causes pain. Certain things may also trigger migraines, such as: Alcohol. Smoking. Stress. Menstruation. Aged cheeses. Foods or drinks that contain nitrates, glutamate, aspartame, or tyramine. Lack of sleep. Chocolate. Caffeine. Hunger. Physical exertion. Fatigue. Medicines used to treat chest pain (nitroglycerine), birth control pills, estrogen, and some blood pressure medicines. SIGNS AND SYMPTOMS Pain on one or both sides of your head. Pulsating or throbbing pain. Severe pain that prevents daily activities. Pain that is aggravated by any physical activity. Nausea, vomiting, or both. Dizziness. Pain with exposure to bright lights, loud noises, or activity. General sensitivity to bright lights, loud noises, or smells. Before you get a migraine, you may get warning signs that a migraine is coming (aura). An aura may include: Seeing flashing lights. Seeing bright spots, halos, or zig-zag lines. Having tunnel vision or blurred vision. Having feelings of numbness or tingling. Having trouble talking. Having muscle weakness. DIAGNOSIS  A migraine headache is often diagnosed based on: Symptoms. Physical exam. A CT scan or MRI of your head. These imaging tests cannot diagnose migraines, but they can help rule out  other causes of headaches. TREATMENT Medicines may be given for pain and nausea. Medicines can also be given to help prevent recurrent migraines.  HOME CARE INSTRUCTIONS Only take over-the-counter or prescription medicines for pain or discomfort as directed by your health care provider. The use of long-term narcotics is not recommended. Lie down in a dark, quiet room when you have a migraine. Keep a journal to find out what may trigger your migraine headaches. For example, write down: What you eat and drink. How much sleep you get. Any change to your diet or medicines. Limit alcohol consumption. Quit smoking if you smoke. Get 7 9 hours of sleep, or as recommended by your health care provider. Limit stress. Keep lights dim if bright lights bother you and make your migraines worse. SEEK IMMEDIATE MEDICAL CARE IF:  Your migraine becomes severe. You have a fever. You have a stiff neck. You have vision loss. You have muscular weakness or loss of muscle control. You start losing your balance or have trouble walking. You feel faint or pass out. You have severe symptoms that are different from your first symptoms. MAKE SURE YOU:  Understand these instructions. Will watch your condition. Will get help right away if you are not doing well or get worse. Document Released: 06/10/2005 Document Revised: 03/31/2013 Document Reviewed: 02/15/2013 [facility] Patient Information 2014 [place].   Ice used for acute (sudden) conditions may be effective. Use a large plastic bag filled with ice and wrapped in a towel. This may provide pain relief.  See your caregiver for continued problems. Your caregiver can help or refer you for exercises or physical therapy if necessary. If you were given medications for your condition, do not drive, operate machinery or power tools, or sign legal documents for 24 hours. Do not drink alcohol, take sleeping pills, or take other medications that may interfere with  treatment. See your caregiver immediately if you have pain that is becoming worse and not relieved by medications. Document Released: 03/05/2001 Document Revised: 03/31/2013 Document Reviewed: 06/10/2005 [facility] Patient Information 2014 [place].

## 2013-10-11 NOTE — ED Notes (Signed)
C/o headache off and on since Saturday.  She took Ibuprofen on Sat and Tylenol today without relief.  Woke up and vomited x 4 and diarrhea x 2 onset @ 2200 yesterday.  No vomiting yesterday @ 2300.  She works at a [employer] and does not know if she was exposed to anything.

## 2013-10-11 NOTE — ED Provider Notes (Signed)
CSN: [id number]     Arrival date & time 10/11/13  1744 History   First MD Initiated Contact with Patient 10/11/13 1933     Chief Complaint  Patient presents with  . Headache   (Consider location/radiation/quality/duration/timing/severity/associated sxs/prior Treatment) HPI Comments: 28 y o F with headache for 2 d. Located between the eyes , the glabella without radiation. Occurs off and on, waxes and wanes. Rated 5/5 now. V x 4 yesterday PM but no GI sx's since. No hx of trauma. No hx of headaches.    Past Medical History  Diagnosis Date  . Hypertension   . 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 Kidney transplant Bilateral 2012   History reviewed. No pertinent family history. History  Substance Use Topics  . Smoking status: Never Smoker   . Smokeless tobacco: Never Used  . Alcohol Use: No   OB History   Grav Para Term Preterm Abortions TAB SAB Ect Mult Living                 Review of Systems  Constitutional: Negative for fever, activity change, appetite change and fatigue.  HENT: Negative for congestion, ear pain, facial swelling, hearing loss, postnasal drip, rhinorrhea, sinus pressure, sore throat, tinnitus and trouble swallowing.   Eyes: Negative for photophobia, pain, redness and visual disturbance.  Cardiovascular: Negative for chest pain, palpitations and leg swelling.  Gastrointestinal: Negative for abdominal pain.       As per HPI  Genitourinary: Negative.   Musculoskeletal: Negative for back pain, gait problem, myalgias and neck pain.  Skin: Negative for color change, pallor and rash.  Neurological: Positive for headaches. Negative for dizziness, tremors, seizures, syncope, facial asymmetry, speech difficulty, weakness, light-headedness and numbness.  Psychiatric/Behavioral: Negative.     Allergies  Review of patient's allergies indicates no known allergies.  Home  Medications   Prior to Admission medications   Medication Sig Start Date End Date Taking? Authorizing Provider  labetalol (NORMODYNE) 300 MG tablet Take 300 mg by mouth 3 (three) times daily.   Yes Historical Provider, MD  magnesium oxide (MAG-OX) 400 MG tablet Take 400 mg by mouth daily.   Yes Historical Provider, MD  omeprazole (PRILOSEC) 20 MG capsule Take 20 mg by mouth daily.   Yes Historical Provider, MD  sodium bicarbonate 650 MG tablet Take 1,300 mg by mouth 2 (two) times daily.   Yes Historical Provider, MD  sulfamethoxazole-trimethoprim (BACTRIM DS) 800-160 MG per tablet Take 1 tablet by mouth 3 (three) times a week. Mon, wed, fri   Yes Historical Provider, MD  tacrolimus (PROGRAF) 1 MG capsule Take 4 mg by mouth 2 (two) times daily.   Yes Historical Provider, MD   BP 108/74  Pulse 68  Temp(Src) 98.3 F (36.8 C) (Oral)  Resp 15  SpO2 97%  LMP 10/05/2013 Physical Exam  Nursing note and vitals reviewed. Constitutional: She is oriented to person, place, and time. She appears well-developed and well-nourished. No distress.  HENT:  Head: Normocephalic and atraumatic.  Nose: Nose normal.  Mouth/Throat: Oropharynx is clear and moist. No oropharyngeal exudate.  Bilat TM's nl, no hemotympanum. Soft palate rises symmetrically   Eyes: Conjunctivae and EOM are normal. Pupils are equal, round, and reactive to light. Right eye exhibits no discharge. Left eye exhibits no discharge. No scleral icterus.  Neck: Normal range of motion. Neck supple. No thyromegaly present.  Cardiovascular: Normal rate,  regular rhythm and normal heart sounds.   Pulmonary/Chest: Effort normal and breath sounds normal. No respiratory distress. She has no wheezes. She has no rales.  Abdominal: Soft. There is no tenderness.  Musculoskeletal: She exhibits no edema and no tenderness.  Lymphadenopathy:    She has no cervical adenopathy.  Neurological: She is alert and oriented to person, place, and time. She has  normal strength. She displays no tremor. No cranial nerve deficit or sensory deficit. She exhibits normal muscle tone. She displays a negative Romberg sign. She displays no seizure activity. Coordination and gait normal. GCS eye subscore is 4. GCS verbal subscore is 5. GCS motor subscore is 6.  Reflex Scores:      Patellar reflexes are 3+ on the right side and 3+ on the left side. Heel to toe nl and balanced.  Finger to nose coordinated well.  Skin: Skin is warm and dry. No rash noted.  Psychiatric: She has a normal mood and affect. Her behavior is normal. Judgment and thought content normal.    ED Course  Procedures (including critical care time) Labs Review Labs Reviewed - No data to display  Imaging Review No results found.   MDM   1. Headache     No neurologic deficits, nl exam Decadron 5 mg and toradol 30 mg IM zofran 4 mg po now Discussed in detail of warning symptoms to go to the ED.  If not improved 2 d see your doctor.       Janne Napoleon, NP 10/11/13 2127

## 2013-10-14 DIAGNOSIS — Z79899 Other long term (current) drug therapy: Secondary | ICD-10-CM | POA: Diagnosis not present

## 2013-10-14 DIAGNOSIS — T8610 Unspecified complication of kidney transplant: Secondary | ICD-10-CM | POA: Diagnosis not present

## 2013-10-14 DIAGNOSIS — Z940 Kidney transplant status: Secondary | ICD-10-CM | POA: Diagnosis not present

## 2013-10-14 DIAGNOSIS — D631 Anemia in chronic kidney disease: Secondary | ICD-10-CM | POA: Diagnosis not present

## 2013-10-14 DIAGNOSIS — I10 Essential (primary) hypertension: Secondary | ICD-10-CM | POA: Diagnosis not present

## 2013-10-14 DIAGNOSIS — D899 Disorder involving the immune mechanism, unspecified: Secondary | ICD-10-CM | POA: Diagnosis not present

## 2013-10-14 DIAGNOSIS — N9989 Other postprocedural complications and disorders of genitourinary system: Secondary | ICD-10-CM | POA: Diagnosis not present

## 2013-10-14 DIAGNOSIS — IMO0002 Reserved for concepts with insufficient information to code with codable children: Secondary | ICD-10-CM | POA: Diagnosis not present

## 2013-10-15 NOTE — ED Provider Notes (Signed)
Medical screening examination/treatment/procedure(s) were performed by resident physician or non-physician practitioner and as supervising physician I was immediately available for consultation/collaboration.   Pauline Good MD.   Billy Fischer, MD 10/15/13 [phone]

## 2013-10-26 DIAGNOSIS — Z79899 Other long term (current) drug therapy: Secondary | ICD-10-CM | POA: Diagnosis not present

## 2013-10-26 DIAGNOSIS — T8610 Unspecified complication of kidney transplant: Secondary | ICD-10-CM | POA: Diagnosis not present

## 2013-10-26 DIAGNOSIS — Z466 Encounter for fitting and adjustment of urinary device: Secondary | ICD-10-CM | POA: Diagnosis not present

## 2013-10-26 DIAGNOSIS — Z940 Kidney transplant status: Secondary | ICD-10-CM | POA: Diagnosis not present

## 2013-10-26 DIAGNOSIS — Z7982 Long term (current) use of aspirin: Secondary | ICD-10-CM | POA: Diagnosis not present

## 2013-10-26 DIAGNOSIS — T8389XA Other specified complication of genitourinary prosthetic devices, implants and grafts, initial encounter: Secondary | ICD-10-CM | POA: Diagnosis not present

## 2013-10-26 DIAGNOSIS — N135 Crossing vessel and stricture of ureter without hydronephrosis: Secondary | ICD-10-CM | POA: Diagnosis not present

## 2013-10-26 DIAGNOSIS — D509 Iron deficiency anemia, unspecified: Secondary | ICD-10-CM | POA: Diagnosis not present

## 2013-10-26 DIAGNOSIS — Z87891 Personal history of nicotine dependence: Secondary | ICD-10-CM | POA: Diagnosis not present

## 2013-10-26 DIAGNOSIS — IMO0002 Reserved for concepts with insufficient information to code with codable children: Secondary | ICD-10-CM | POA: Diagnosis not present

## 2013-10-26 DIAGNOSIS — K219 Gastro-esophageal reflux disease without esophagitis: Secondary | ICD-10-CM | POA: Diagnosis not present

## 2013-11-22 ENCOUNTER — Encounter (HOSPITAL_COMMUNITY): Payer: Self-pay | Admitting: *Deleted

## 2013-11-22 ENCOUNTER — Inpatient Hospital Stay (HOSPITAL_COMMUNITY)
Admission: AD | Admit: 2013-11-22 | Discharge: 2013-11-22 | Disposition: A | Discharge status: Home / Self Care | Payer: Medicare Other | Source: Ambulatory Visit | Attending: Obstetrics & Gynecology | Admitting: Obstetrics & Gynecology

## 2013-11-22 DIAGNOSIS — N390 Urinary tract infection, site not specified: Secondary | ICD-10-CM | POA: Diagnosis not present

## 2013-11-22 DIAGNOSIS — N2581 Secondary hyperparathyroidism of renal origin: Secondary | ICD-10-CM | POA: Diagnosis not present

## 2013-11-22 DIAGNOSIS — R51 Headache: Secondary | ICD-10-CM | POA: Diagnosis not present

## 2013-11-22 DIAGNOSIS — L293 Anogenital pruritus, unspecified: Secondary | ICD-10-CM | POA: Diagnosis not present

## 2013-11-22 DIAGNOSIS — N183 Chronic kidney disease, stage 3 unspecified: Secondary | ICD-10-CM | POA: Diagnosis not present

## 2013-11-22 DIAGNOSIS — E785 Hyperlipidemia, unspecified: Secondary | ICD-10-CM | POA: Diagnosis not present

## 2013-11-22 DIAGNOSIS — I129 Hypertensive chronic kidney disease with stage 1 through stage 4 chronic kidney disease, or unspecified chronic kidney disease: Secondary | ICD-10-CM | POA: Diagnosis not present

## 2013-11-22 DIAGNOSIS — Z940 Kidney transplant status: Secondary | ICD-10-CM | POA: Diagnosis not present

## 2013-11-22 DIAGNOSIS — D631 Anemia in chronic kidney disease: Secondary | ICD-10-CM | POA: Diagnosis not present

## 2013-11-22 DIAGNOSIS — A6000 Herpesviral infection of urogenital system, unspecified: Secondary | ICD-10-CM

## 2013-11-22 DIAGNOSIS — N039 Chronic nephritic syndrome with unspecified morphologic changes: Secondary | ICD-10-CM | POA: Diagnosis not present

## 2013-11-22 LAB — URINALYSIS, ROUTINE W REFLEX MICROSCOPIC
BILIRUBIN URINE: NEGATIVE
Glucose, UA: NEGATIVE mg/dL
Ketones, ur: NEGATIVE mg/dL
Nitrite: NEGATIVE
PH: 7 (ref 5.0–8.0)
Protein, ur: 100 mg/dL — AB
SPECIFIC GRAVITY, URINE: 1.025 (ref 1.005–1.030)
UROBILINOGEN UA: 0.2 mg/dL (ref 0.0–1.0)

## 2013-11-22 LAB — WET PREP, GENITAL
CLUE CELLS WET PREP: NONE SEEN
Trich, Wet Prep: NONE SEEN
Yeast Wet Prep HPF POC: NONE SEEN

## 2013-11-22 LAB — URINE MICROSCOPIC-ADD ON

## 2013-11-22 LAB — POCT PREGNANCY, URINE: PREG TEST UR: NEGATIVE

## 2013-11-22 MED ORDER — LIDOCAINE HCL 2 % EX GEL
Freq: Once | CUTANEOUS | Status: AC
  Administered 2013-11-22: 5 via TOPICAL
  Filled 2013-11-22: qty 5

## 2013-11-22 MED ORDER — ACETAMINOPHEN 325 MG PO TABS
650.0000 mg | ORAL_TABLET | Freq: Once | ORAL | Status: AC
  Administered 2013-11-22: 650 mg via ORAL
  Filled 2013-11-22: qty 2

## 2013-11-22 MED ORDER — VALACYCLOVIR HCL 1 G PO TABS: 1000.0000 mg | ORAL_TABLET | Freq: Two times a day (BID) | ORAL | Status: AC

## 2013-11-22 NOTE — MAU Provider Note (Signed)
History     CSN: [id number]  Arrival date and time: 11/22/13 1738   First Provider Initiated Contact with Patient 11/22/13 1816      Chief Complaint  Patient presents with  . Vaginitis   HPI Kerry Cook 28 y.o.  Client has been having itching for 3-4 days and thinks she has a yeast infection.  Significant history of kidney transplant.  Previously has been on dialysis.  Last intercourse was 2-3 weeks ago.   OB History   Grav Para Term Preterm Abortions TAB SAB Ect Mult Living                  Past Medical History  Diagnosis Date  . Hypertension   . 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 Kidney transplant Bilateral 2012    History reviewed. No pertinent family history.  History  Substance Use Topics  . Smoking status: Never Smoker   . Smokeless tobacco: Never Used  . Alcohol Use: No    Allergies: No Known Allergies  Prescriptions prior to admission  Medication Sig Dispense Refill  . labetalol (NORMODYNE) 300 MG tablet Take 300 mg by mouth 3 (three) times daily.      . magnesium oxide (MAG-OX) 400 MG tablet Take 400 mg by mouth daily.      Marland Kitchen omeprazole (PRILOSEC) 20 MG capsule Take 20 mg by mouth daily.      . sodium bicarbonate 650 MG tablet Take 1,300 mg by mouth 2 (two) times daily.      . tacrolimus (PROGRAF) 1 MG capsule Take 4 mg by mouth 2 (two) times daily.        Review of Systems  Constitutional: Negative for fever.  Gastrointestinal: Negative for nausea, vomiting and abdominal pain.  Genitourinary:       Vaginal discharge. Vaginal itching internally. No vaginal bleeding. No dysuria.   Physical Exam   Blood pressure 115/64, pulse 86, temperature 98.7 F (37.1 C), resp. rate 18.  Physical Exam  Nursing note and vitals reviewed. Constitutional: She is oriented to person, place, and time. She appears well-developed and well-nourished.  HENT:   Head: Normocephalic.  Eyes: EOM are normal.  Neck: Neck supple.  Respiratory: Effort normal.  GI: Soft. There is no tenderness.  Genitourinary:  Genital exam: Vulva - small amount of dry yellow discharge at introitus Labia majora - 2-3 hypopigmented lesions which do not appear to be open but are tender and then at base of introitus - 2 ulcers noted which are quite tender, no bleeding, no erythematous base, also 2 flat darker lesions noted 3 mm in diameter which may be moles or HPV.  Those are nontender. Bimanual exam deferred GC/Chlam and wet prep done by blind vaginal swab and HSV culture done Chaperone present for exam.  Musculoskeletal: Normal range of motion.  Neurological: She is alert and oriented to person, place, and time.  Skin: Skin is warm and dry.  Psychiatric: She has a normal mood and affect.    MAU Course  Procedures Results for orders placed during the hospital encounter of 11/22/13 (from the past 24 hour(s))  URINALYSIS, ROUTINE W REFLEX MICROSCOPIC     Status: Abnormal   Collection Time    11/22/13  5:46 PM      Result Value Ref Range   Color, Urine YELLOW  YELLOW   APPearance CLOUDY (*) CLEAR   Specific  Gravity, Urine 1.025  1.005 - 1.030   pH 7.0  5.0 - 8.0   Glucose, UA NEGATIVE  NEGATIVE mg/dL   Hgb urine dipstick MODERATE (*) NEGATIVE   Bilirubin Urine NEGATIVE  NEGATIVE   Ketones, ur NEGATIVE  NEGATIVE mg/dL   Protein, ur 100 (*) NEGATIVE mg/dL   Urobilinogen, UA 0.2  0.0 - 1.0 mg/dL   Nitrite NEGATIVE  NEGATIVE   Leukocytes, UA LARGE (*) NEGATIVE  URINE MICROSCOPIC-ADD ON     Status: Abnormal   Collection Time    11/22/13  5:46 PM      Result Value Ref Range   Squamous Epithelial / LPF FEW (*) RARE   WBC, UA TOO NUMEROUS TO COUNT  <3 WBC/hpf   RBC / HPF 3-6  <3 RBC/hpf   Bacteria, UA FEW (*) RARE  POCT PREGNANCY, URINE     Status: None   Collection Time    11/22/13  5:54 PM      Result Value Ref Range   Preg Test, Ur NEGATIVE  NEGATIVE  WET  PREP, GENITAL     Status: Abnormal   Collection Time    11/22/13  6:30 PM      Result Value Ref Range   Yeast Wet Prep HPF POC NONE SEEN  NONE SEEN   Trich, Wet Prep NONE SEEN  NONE SEEN   Clue Cells Wet Prep HPF POC NONE SEEN  NONE SEEN   WBC, Wet Prep HPF POC FEW (*) NONE SEEN    MDM Client has been up since 7 am but has not eaten today.  Has not been drinking many fluids today and has a headache now.  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 headache  Plan Will treat with Valtrex and lidocaine gel. Culture is pending. Advised to let her doctor who manages her kidney condition know of this new diagnosis. Discussed course of HSV and advised client to be seen by her doctor or at [facility] so she can have medication on hand if another outbreak occurs. Advised to keep genital area dry. Urine culture pending but likely did not do a clean catch specimen today given the vulvar pain she is having.  Terri L Burleson 11/22/2013, 6:32 PM

## 2013-11-22 NOTE — Discharge Instructions (Signed)
Advised to let her doctor who manages her kidney condition know of this new diagnosis. Discussed course of HSV and advised client to be seen by her doctor or at the STD clinic at the [facility] so she can have medication on hand if another outbreak occurs. Advised to keep genital area dry.

## 2013-11-23 LAB — URINE CULTURE
COLONY COUNT: NO GROWTH
Culture: NO GROWTH

## 2013-11-23 LAB — GC/CHLAMYDIA PROBE AMP
CT Probe RNA: POSITIVE — AB
GC Probe RNA: NEGATIVE

## 2013-11-24 LAB — HERPES SIMPLEX VIRUS CULTURE: CULTURE: DETECTED

## 2013-11-25 ENCOUNTER — Inpatient Hospital Stay (HOSPITAL_COMMUNITY)
Admission: AD | Admit: 2013-11-25 | Discharge: 2013-11-25 | Disposition: A | Discharge status: Home / Self Care | Payer: Medicare Other | Source: Ambulatory Visit | Attending: Obstetrics and Gynecology | Admitting: Obstetrics and Gynecology

## 2013-11-25 ENCOUNTER — Telehealth (HOSPITAL_COMMUNITY): Payer: Self-pay | Admitting: Nurse Practitioner

## 2013-11-25 DIAGNOSIS — A749 Chlamydial infection, unspecified: Secondary | ICD-10-CM | POA: Diagnosis not present

## 2013-11-25 MED ORDER — AZITHROMYCIN 500 MG PO TABS: 1000.0000 mg | ORAL_TABLET | Freq: Once | ORAL | Status: DC

## 2013-11-25 NOTE — Telephone Encounter (Signed)
Telephone call to patient regarding positive chlamydia culture, patient not in, left message for her to call.  Patient has not been treated and will need referral for treatment.  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  Patient was given treatment for this at the time of her visit.  Report faxed to health department.

## 2013-11-25 NOTE — MAU Provider Note (Signed)
Kerry Cook is a 28 y.o. female who presents to MAU today for STD treatment. Patient was seen in MAU on 11/22/13 and had +chlamydia.    GENERAL: Well-developed, well-nourished female in no acute distress.  HEENT: Normocephalic, atraumatic.   LUNGS: Effort normal HEART: Regular rate  SKIN: Warm, dry and without erythema PSYCH: Normal mood and affect  A: Chlamydia  P: Discharge home Rx for zithromax given Advised partner treatment Patient may return to MAU as needed or if her condition were to change or worsen  Farris Has, PA-C  11/25/2013 8:04 PM

## 2013-11-25 NOTE — MAU Provider Note (Signed)
Attestation of Attending Supervision of Advanced Practitioner (CNM/NP): Evaluation and management procedures were performed by the Advanced Practitioner under my supervision and collaboration.  I have reviewed the Advanced Practitioner's note and chart, and I agree with the management and plan.  Annarose Ouellet 11/25/2013 8:37 PM

## 2013-11-25 NOTE — Discharge Instructions (Signed)
Chlamydia, Female Chlamydia is an infection. It is spread from one person to another person during sexual contact. This infection can be in the cervix, urine tube (urethra), throat, or bottom (rectum). This infection needs treatment. HOME CARE   Take your medicines (antibiotics) as told. Finish them even if you start to feel better.  Only take medicine as told by your doctor.  Tell your sex partner(s) that you have chlamydia. They must also be treated.  Do not have sex until your doctor says it is okay.  Rest.  Eat healthy. Drink enough fluids to keep your pee (urine) clear or pale yellow.  Keep all doctor visits as told. GET HELP IF:  You have pain when you pee.  You have belly pain.  You have vaginal discharge.  You have pain during sex.  You are a woman and have bleeding between periods and after sex. GET HELP RIGHT AWAY IF:   You have a fever that will not go away after 3 days.  You have a fever and your symptoms suddenly get worse.  You feel sick to your stomach (nauseous) or you throw up (vomit).  You sweat much more than normal (diaphoresis).  You have trouble swallowing. MAKE SURE YOU:   Understand these instructions.  Will watch your condition.  Will get help right away if you are not doing well or get worse. Document Released: 03/19/2008 Document Revised: 03/31/2013 Document Reviewed: 02/15/2013 [facility] Patient Information 2014 [place].

## 2013-11-30 ENCOUNTER — Encounter (HOSPITAL_COMMUNITY): Payer: Self-pay | Admitting: *Deleted

## 2013-11-30 NOTE — Telephone Encounter (Signed)
Still unable to reach patient by phone.  Left another message.  Certified letter mailed.  Patient needs to be notified of results and treatment for her chlamydia.

## 2014-03-01 DIAGNOSIS — N186 End stage renal disease: Secondary | ICD-10-CM | POA: Diagnosis not present

## 2014-03-01 DIAGNOSIS — T8610 Unspecified complication of kidney transplant: Secondary | ICD-10-CM | POA: Diagnosis not present

## 2014-03-01 DIAGNOSIS — E872 Acidosis, unspecified: Secondary | ICD-10-CM | POA: Diagnosis not present

## 2014-03-01 DIAGNOSIS — Z466 Encounter for fitting and adjustment of urinary device: Secondary | ICD-10-CM | POA: Diagnosis not present

## 2014-03-01 DIAGNOSIS — Z87891 Personal history of nicotine dependence: Secondary | ICD-10-CM | POA: Diagnosis not present

## 2014-03-01 DIAGNOSIS — I120 Hypertensive chronic kidney disease with stage 5 chronic kidney disease or end stage renal disease: Secondary | ICD-10-CM | POA: Diagnosis not present

## 2014-03-01 DIAGNOSIS — Z940 Kidney transplant status: Secondary | ICD-10-CM | POA: Diagnosis not present

## 2014-03-01 DIAGNOSIS — K219 Gastro-esophageal reflux disease without esophagitis: Secondary | ICD-10-CM | POA: Diagnosis not present

## 2014-03-01 DIAGNOSIS — D509 Iron deficiency anemia, unspecified: Secondary | ICD-10-CM | POA: Diagnosis not present

## 2014-03-01 DIAGNOSIS — N135 Crossing vessel and stricture of ureter without hydronephrosis: Secondary | ICD-10-CM | POA: Diagnosis not present

## 2014-03-01 DIAGNOSIS — B259 Cytomegaloviral disease, unspecified: Secondary | ICD-10-CM | POA: Diagnosis not present

## 2014-03-08 ENCOUNTER — Ambulatory Visit: Payer: Self-pay | Admitting: Internal Medicine

## 2014-03-15 ENCOUNTER — Emergency Department (HOSPITAL_COMMUNITY)
Admission: EM | Admit: 2014-03-15 | Discharge: 2014-03-16 | Disposition: A | Discharge status: Home / Self Care | Payer: Medicare Other | Attending: Emergency Medicine | Admitting: Emergency Medicine

## 2014-03-15 DIAGNOSIS — R0989 Other specified symptoms and signs involving the circulatory and respiratory systems: Secondary | ICD-10-CM | POA: Diagnosis not present

## 2014-03-15 DIAGNOSIS — R059 Cough, unspecified: Secondary | ICD-10-CM | POA: Diagnosis not present

## 2014-03-15 DIAGNOSIS — Z79899 Other long term (current) drug therapy: Secondary | ICD-10-CM | POA: Diagnosis not present

## 2014-03-15 DIAGNOSIS — R079 Chest pain, unspecified: Secondary | ICD-10-CM | POA: Diagnosis not present

## 2014-03-15 DIAGNOSIS — J069 Acute upper respiratory infection, unspecified: Secondary | ICD-10-CM

## 2014-03-15 DIAGNOSIS — R05 Cough: Secondary | ICD-10-CM | POA: Diagnosis not present

## 2014-03-15 DIAGNOSIS — Z940 Kidney transplant status: Secondary | ICD-10-CM | POA: Diagnosis not present

## 2014-03-15 DIAGNOSIS — B9789 Other viral agents as the cause of diseases classified elsewhere: Secondary | ICD-10-CM

## 2014-03-15 DIAGNOSIS — I10 Essential (primary) hypertension: Secondary | ICD-10-CM | POA: Diagnosis not present

## 2014-03-15 NOTE — ED Notes (Signed)
Pt states she has had URI symptoms x 2 weeks that worsened today. Alert and oriented.

## 2014-03-16 ENCOUNTER — Emergency Department (HOSPITAL_COMMUNITY): Payer: Medicare Other

## 2014-03-16 DIAGNOSIS — R0989 Other specified symptoms and signs involving the circulatory and respiratory systems: Secondary | ICD-10-CM | POA: Diagnosis not present

## 2014-03-16 DIAGNOSIS — R05 Cough: Secondary | ICD-10-CM | POA: Diagnosis not present

## 2014-03-16 DIAGNOSIS — J069 Acute upper respiratory infection, unspecified: Secondary | ICD-10-CM | POA: Diagnosis not present

## 2014-03-16 DIAGNOSIS — R059 Cough, unspecified: Secondary | ICD-10-CM | POA: Diagnosis not present

## 2014-03-16 DIAGNOSIS — R079 Chest pain, unspecified: Secondary | ICD-10-CM | POA: Diagnosis not present

## 2014-03-16 MED ORDER — FLUTICASONE PROPIONATE 50 MCG/ACT NA SUSP
2.0000 | Freq: Every day | NASAL | Status: DC
  Administered 2014-03-16: 2 via NASAL
  Filled 2014-03-16 (×2): qty 16

## 2014-03-16 MED ORDER — AMOXICILLIN-POT CLAVULANATE 875-125 MG PO TABS: 1.0000 | ORAL_TABLET | Freq: Two times a day (BID) | ORAL | Status: DC

## 2014-03-16 MED ORDER — BENZONATATE 100 MG PO CAPS: 100.0000 mg | ORAL_CAPSULE | Freq: Three times a day (TID) | ORAL | Status: DC

## 2014-03-16 MED ORDER — ONDANSETRON HCL 4 MG PO TABS: 4.0000 mg | ORAL_TABLET | Freq: Four times a day (QID) | ORAL | Status: DC

## 2014-03-16 NOTE — Discharge Instructions (Signed)
Upper Respiratory Infection, Adult An upper respiratory infection (URI) is also known as the common cold. It is often caused by a type of germ (virus). Colds are easily spread (contagious). You can pass it to others by kissing, coughing, sneezing, or drinking out of the same glass. Usually, you get better in 1 or 2 weeks.  HOME CARE   Only take medicine as told by your doctor.  Use a warm mist humidifier or breathe in steam from a hot shower.  Drink enough water and fluids to keep your pee (urine) clear or pale yellow.  Get plenty of rest.  Return to work when your temperature is back to normal or as told by your doctor. You may use a face mask and wash your hands to stop your cold from spreading. GET HELP RIGHT AWAY IF:   After the first few days, you feel you are getting worse.  You have questions about your medicine.  You have chills, shortness of breath, or brown or red spit (mucus).  You have yellow or brown snot (nasal discharge) or pain in the face, especially when you bend forward.  You have a fever, puffy (swollen) neck, pain when you swallow, or white spots in the back of your throat.  You have a bad headache, ear pain, sinus pain, or chest pain.  You have a high-pitched whistling sound when you breathe in and out (wheezing).  You have a lasting cough or cough up blood.  You have sore muscles or a stiff neck. MAKE SURE YOU:   Understand these instructions.  Will watch your condition.  Will get help right away if you are not doing well or get worse. Document Released: 11/27/2007 Document Revised: 09/02/2011 Document Reviewed: 09/15/2013 [employer] Patient Information 2015 [employer]. This information is not intended to replace advice given to you by your health care provider. Make sure you discuss any questions you have with your health care provider.  

## 2014-03-16 NOTE — ED Provider Notes (Signed)
CSN: [id number]     Arrival date & time 03/15/14  2332 History   First MD Initiated Contact with Patient 03/16/14 0033     Chief Complaint  Patient presents with  . URI    (Consider location/radiation/quality/duration/timing/severity/associated sxs/prior Treatment) HPI Comments: Patient is a 28 year old female with a history of hypertension and kidney transplant in 2012, currently on Prograf daily, who presents to the emergency department for upper respiratory symptoms x10 days. Patient states that symptoms began with nasal congestion, rhinorrhea, and postnasal drip. She states that she has since developed a nonproductive cough as well as nausea and one episode of emesis today. Patient denies any blood in her emesis. She denies any fever over 100.28F. No chest pain, shortness of breath, diarrhea, melena or hematochezia, dysuria or hematuria. 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 She denies any other known sick contacts.  The history is provided by the patient. No language interpreter was used.    Past Medical History  Diagnosis Date  . Hypertension   . 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 Kidney transplant Bilateral 2012   No family history on file. History  Substance Use Topics  . Smoking status: Never Smoker   . Smokeless tobacco: Never Used  . Alcohol Use: No   OB History   Grav Para Term Preterm Abortions TAB SAB Ect Mult Living                  Review of Systems  Constitutional: Negative for fever.  HENT: Positive for congestion and rhinorrhea.   Respiratory: Positive for cough.   Cardiovascular: Negative for chest pain.  Gastrointestinal: Positive for nausea and vomiting (x1). Negative for abdominal pain.  Genitourinary: Negative for dysuria and hematuria.  Neurological: Negative for syncope.  All other systems reviewed and are  negative.   Allergies  Review of patient's allergies indicates no known allergies.  Home Medications   Prior to Admission medications   Medication Sig Start Date End Date Taking? Authorizing Provider  labetalol (NORMODYNE) 300 MG tablet Take 300 mg by mouth 3 (three) times daily.   Yes Historical Provider, MD  magnesium oxide (MAG-OX) 400 MG tablet Take 400 mg by mouth daily.   Yes Historical Provider, MD  omeprazole (PRILOSEC) 20 MG capsule Take 20 mg by mouth daily.   Yes Historical Provider, MD  sodium bicarbonate 650 MG tablet Take 1,300 mg by mouth 2 (two) times daily.   Yes Historical Provider, MD  tacrolimus (PROGRAF) 1 MG capsule Take 4 mg by mouth 2 (two) times daily.   Yes Historical Provider, MD  amoxicillin-clavulanate (AUGMENTIN) 875-125 MG per tablet Take 1 tablet by mouth every 12 (twelve) hours. 03/16/14   Antonietta Breach, PA-C  benzonatate (TESSALON) 100 MG capsule Take 1 capsule (100 mg total) by mouth every 8 (eight) hours. 03/16/14   Antonietta Breach, PA-C  ondansetron (ZOFRAN) 4 MG tablet Take 1 tablet (4 mg total) by mouth every 6 (six) hours. 03/16/14   Antonietta Breach, PA-C   BP 163/108  Pulse 86  Temp(Src) 98.4 F (36.9 C) (Oral)  SpO2 98%  LMP 03/08/2014  Physical Exam  Nursing note and vitals reviewed. Constitutional: She is oriented to person, place, and time. She appears well-developed and well-nourished. No distress.  Nontoxic/nontoxic appearing  HENT:  Head: Normocephalic and atraumatic.  Mouth/Throat: Oropharynx is clear and moist. No  oropharyngeal exudate.  Copious nasal congestion appreciated without rhinorrhea. Oropharynx clear. Uvula midline. Patient tolerating secretions without difficulty.  Eyes: Conjunctivae and EOM are normal. Pupils are equal, round, and reactive to light. No scleral icterus.  Neck: Normal range of motion.  Cardiovascular: Normal rate, regular rhythm and normal heart sounds.   Pulmonary/Chest: Effort normal and breath sounds normal. No  respiratory distress. She has no wheezes. She has no rales.  Chest expansion symmetric. No tachypnea or dyspnea. No retractions or accessory muscle use. He  Abdominal: Soft. She exhibits no distension. There is no tenderness. There is no rebound and no guarding.  Soft, nontender. No masses.  Musculoskeletal: Normal range of motion.  Neurological: She is alert and oriented to person, place, and time. She exhibits normal muscle tone. Coordination normal.  GCS 15. Patient moves extremities without ataxia. She ambulates with normal gait  Skin: Skin is warm and dry. No rash noted. She is not diaphoretic. No erythema. No pallor.  Psychiatric: She has a normal mood and affect. Her behavior is normal.    ED Course  Procedures (including critical care time) Labs Review Labs Reviewed - No data to display  Imaging Review Dg Chest 2 View  03/16/2014   CLINICAL DATA:  Cough congestion chest pain 1 week, fever this morning  EXAM: CHEST  2 VIEW  COMPARISON:  None.  FINDINGS: The heart size and mediastinal contours are within normal limits. Both lungs are clear. The visualized skeletal structures are unremarkable.  IMPRESSION: No active cardiopulmonary disease.   Electronically Signed   By: Skipper Cliche M.D.   On: 03/16/2014 01:47     EKG Interpretation None      MDM   Final diagnoses:  Viral URI with cough    28 year old female presents to the emergency department for upper respiratory symptoms and cough. She endorses developing nausea as well as one episode of emesis today. Patient with history of kidney transplant and is currently on Prograf daily. She also states that she takes prednisone daily. Physical exam is reassuring. Findings significant for copious nasal congestion only. No tachypnea, dyspnea, or hypoxia. Lungs clear bilaterally. Chest x-ray shows no evidence of focal consolidation or pneumonia.  Given patient's history of kidney transplant and use of immunosuppressants, will  initiate patient on Augmentin therapy. Possible, also, that symptoms are viral in nature. Patient given Flonase for nasal congestion as well as Tessalon for cough and Zofran for nausea. She is able to tolerate fluids in ED without further emesis. Patient advised to followup with her primary care provider and to rest as well as drink plenty of fluids. Return precautions discussed and provided. Patient agreeable to plan with unaddressed concerns. Patient discharged in good condition.   Filed Vitals:   03/15/14 2350 03/16/14 0105  BP: 163/108   Pulse: 86   Temp:  98.4 F (36.9 C)  TempSrc:  Oral  SpO2: 98%      Antonietta Breach, PA-C 03/19/14 [phone]

## 2014-03-19 NOTE — ED Provider Notes (Signed)
Medical screening examination/treatment/procedure(s) were performed by non-physician practitioner and as supervising physician I was immediately available for consultation/collaboration.   EKG Interpretation None        Merryl Hacker, MD 03/19/14 [phone]

## 2014-05-22 ENCOUNTER — Emergency Department (HOSPITAL_COMMUNITY)
Admission: EM | Admit: 2014-05-22 | Discharge: 2014-05-23 | Disposition: A | Discharge status: Home / Self Care | Payer: Medicare Other | Attending: Emergency Medicine | Admitting: Emergency Medicine

## 2014-05-22 DIAGNOSIS — Z79899 Other long term (current) drug therapy: Secondary | ICD-10-CM | POA: Insufficient documentation

## 2014-05-22 DIAGNOSIS — O2331 Infections of other parts of urinary tract in pregnancy, first trimester: Secondary | ICD-10-CM | POA: Insufficient documentation

## 2014-05-22 DIAGNOSIS — Z3A01 Less than 8 weeks gestation of pregnancy: Secondary | ICD-10-CM | POA: Insufficient documentation

## 2014-05-22 DIAGNOSIS — I10 Essential (primary) hypertension: Secondary | ICD-10-CM | POA: Insufficient documentation

## 2014-05-22 DIAGNOSIS — O9989 Other specified diseases and conditions complicating pregnancy, childbirth and the puerperium: Secondary | ICD-10-CM | POA: Diagnosis not present

## 2014-05-22 DIAGNOSIS — Z940 Kidney transplant status: Secondary | ICD-10-CM | POA: Insufficient documentation

## 2014-05-22 DIAGNOSIS — O26891 Other specified pregnancy related conditions, first trimester: Secondary | ICD-10-CM | POA: Diagnosis not present

## 2014-05-22 DIAGNOSIS — N390 Urinary tract infection, site not specified: Secondary | ICD-10-CM

## 2014-05-22 DIAGNOSIS — O26899 Other specified pregnancy related conditions, unspecified trimester: Secondary | ICD-10-CM

## 2014-05-22 DIAGNOSIS — R197 Diarrhea, unspecified: Secondary | ICD-10-CM | POA: Insufficient documentation

## 2014-05-22 DIAGNOSIS — R102 Pelvic and perineal pain: Secondary | ICD-10-CM | POA: Diagnosis not present

## 2014-05-22 DIAGNOSIS — O2341 Unspecified infection of urinary tract in pregnancy, first trimester: Secondary | ICD-10-CM | POA: Diagnosis not present

## 2014-05-23 ENCOUNTER — Encounter (HOSPITAL_COMMUNITY): Payer: Self-pay

## 2014-05-23 ENCOUNTER — Emergency Department (HOSPITAL_COMMUNITY): Payer: Medicare Other

## 2014-05-23 DIAGNOSIS — R102 Pelvic and perineal pain: Secondary | ICD-10-CM | POA: Diagnosis not present

## 2014-05-23 DIAGNOSIS — O9989 Other specified diseases and conditions complicating pregnancy, childbirth and the puerperium: Secondary | ICD-10-CM | POA: Diagnosis not present

## 2014-05-23 DIAGNOSIS — Z3A01 Less than 8 weeks gestation of pregnancy: Secondary | ICD-10-CM | POA: Diagnosis not present

## 2014-05-23 DIAGNOSIS — O26891 Other specified pregnancy related conditions, first trimester: Secondary | ICD-10-CM | POA: Diagnosis not present

## 2014-05-23 LAB — URINE MICROSCOPIC-ADD ON

## 2014-05-23 LAB — CBC WITH DIFFERENTIAL/PLATELET
BASOS PCT: 0 % (ref 0–1)
Basophils Absolute: 0 10*3/uL (ref 0.0–0.1)
Eosinophils Absolute: 0.1 10*3/uL (ref 0.0–0.7)
Eosinophils Relative: 1 % (ref 0–5)
HEMATOCRIT: 40.8 % (ref 36.0–46.0)
HEMOGLOBIN: 13.4 g/dL (ref 12.0–15.0)
Lymphocytes Relative: 16 % (ref 12–46)
Lymphs Abs: 2 10*3/uL (ref 0.7–4.0)
MCH: 28.4 pg (ref 26.0–34.0)
MCHC: 32.8 g/dL (ref 30.0–36.0)
MCV: 86.4 fL (ref 78.0–100.0)
MONO ABS: 1.1 10*3/uL — AB (ref 0.1–1.0)
Monocytes Relative: 9 % (ref 3–12)
NEUTROS ABS: 9.3 10*3/uL — AB (ref 1.7–7.7)
NEUTROS PCT: 74 % (ref 43–77)
Platelets: 218 10*3/uL (ref 150–400)
RBC: 4.72 MIL/uL (ref 3.87–5.11)
RDW: 16.2 % — ABNORMAL HIGH (ref 11.5–15.5)
WBC: 12.5 10*3/uL — ABNORMAL HIGH (ref 4.0–10.5)

## 2014-05-23 LAB — COMPREHENSIVE METABOLIC PANEL
ALBUMIN: 4.3 g/dL (ref 3.5–5.2)
ALT: 6 U/L (ref 0–35)
AST: 16 U/L (ref 0–37)
Alkaline Phosphatase: 92 U/L (ref 39–117)
Anion gap: 18 — ABNORMAL HIGH (ref 5–15)
BILIRUBIN TOTAL: 0.3 mg/dL (ref 0.3–1.2)
BUN: 26 mg/dL — AB (ref 6–23)
CHLORIDE: 97 meq/L (ref 96–112)
CO2: 18 mEq/L — ABNORMAL LOW (ref 19–32)
Calcium: 10 mg/dL (ref 8.4–10.5)
Creatinine, Ser: 2.38 mg/dL — ABNORMAL HIGH (ref 0.50–1.10)
GFR, EST AFRICAN AMERICAN: 31 mL/min — AB (ref >90)
GFR, EST NON AFRICAN AMERICAN: 27 mL/min — AB (ref >90)
GLUCOSE: 95 mg/dL (ref 70–99)
Potassium: 4.2 mEq/L (ref 3.7–5.3)
Sodium: 133 mEq/L — ABNORMAL LOW (ref 137–147)
Total Protein: 8.9 g/dL — ABNORMAL HIGH (ref 6.0–8.3)

## 2014-05-23 LAB — URINALYSIS, ROUTINE W REFLEX MICROSCOPIC
BILIRUBIN URINE: NEGATIVE
Glucose, UA: NEGATIVE mg/dL
KETONES UR: NEGATIVE mg/dL
Nitrite: NEGATIVE
PH: 6.5 (ref 5.0–8.0)
Protein, ur: >300 mg/dL — AB
Specific Gravity, Urine: 1.019 (ref 1.005–1.030)
Urobilinogen, UA: 0.2 mg/dL (ref 0.0–1.0)

## 2014-05-23 LAB — POC URINE PREG, ED: Preg Test, Ur: POSITIVE — AB

## 2014-05-23 LAB — WET PREP, GENITAL
Clue Cells Wet Prep HPF POC: NONE SEEN
TRICH WET PREP: NONE SEEN
WBC, Wet Prep HPF POC: NONE SEEN
YEAST WET PREP: NONE SEEN

## 2014-05-23 LAB — RAPID HIV SCREEN (WH-MAU): Rapid HIV Screen: NONREACTIVE

## 2014-05-23 LAB — ABO/RH: ABO/RH(D): O POS

## 2014-05-23 LAB — HCG, QUANTITATIVE, PREGNANCY: HCG, BETA CHAIN, QUANT, S: 47703 m[IU]/mL — AB (ref <5)

## 2014-05-23 MED ORDER — DEXTROSE 5 % IV SOLN
1.0000 g | Freq: Once | INTRAVENOUS | Status: AC
  Administered 2014-05-23: 1 g via INTRAVENOUS
  Filled 2014-05-23: qty 10

## 2014-05-23 MED ORDER — CIPROFLOXACIN HCL 500 MG PO TABS: 500.0000 mg | ORAL_TABLET | Freq: Once | ORAL | Status: DC

## 2014-05-23 MED ORDER — CEPHALEXIN 500 MG PO CAPS: 500.0000 mg | ORAL_CAPSULE | Freq: Four times a day (QID) | ORAL | Status: DC

## 2014-05-23 NOTE — ED Notes (Signed)
Pt is aware of the need for urine sample.  

## 2014-05-23 NOTE — ED Notes (Signed)
Pt is aware of the need for a stool sample, however is unable to provide one at this time.

## 2014-05-23 NOTE — ED Notes (Signed)
Pt presents with c/o abdominal pain and diarrhea since Thursday of last week. Pt reports she has not been able to keep anything down. Pt denies vomiting.

## 2014-05-23 NOTE — Discharge Instructions (Signed)
Your ultrasound shows that you are approximately [redacted] weeks pregnant -  Keflex for the urine infection Return to ER if symptoms worsen.  Please call your doctor for a followup appointment within 24-48 hours. When you talk to your doctor please let them know that you were seen in the emergency department and have them acquire all of your records so that they can discuss the findings with you and formulate a treatment plan to fully care for your new and ongoing problems.

## 2014-05-23 NOTE — ED Provider Notes (Signed)
CSN: [id number]     Arrival date & time 05/22/14  2305 History   First MD Initiated Contact with Patient 05/23/14 0207     Chief Complaint  Patient presents with  . Diarrhea  . Abdominal Pain     (Consider location/radiation/quality/duration/timing/severity/associated sxs/prior Treatment) HPI Comments: 28 year old female, history of kidney transplant, on antirejection medications who presents with approximately 4 days of diarrhea which she describes as very loose and green. There is associated abdominal cramping that occurs when she has a bowel movement, she is having bowel movements approximately 4 times per day, she is not having any difficulty urinating and is making normal amounts of urine, denies fevers chills shortness of breath or back pain. Her abdomen pain is only present when she has a bowel movement. She denies any recent antibiotic use, no known sick contacts, no known food poisoning.  Patient is a 29 y.o. female presenting with diarrhea and abdominal pain. The history is provided by the patient.  Diarrhea Associated symptoms: abdominal pain   Abdominal Pain Associated symptoms: diarrhea     Past Medical History  Diagnosis Date  . Hypertension   . 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 Kidney transplant Bilateral 2012   No family history on file. History  Substance Use Topics  . Smoking status: Never Smoker   . Smokeless tobacco: Never Used  . Alcohol Use: No   OB History    No data available     Review of Systems  Gastrointestinal: Positive for abdominal pain and diarrhea.  All other systems reviewed and are negative.     Allergies  Review of patient's allergies indicates no known allergies.  Home Medications   Prior to Admission medications   Medication Sig Start Date End Date Taking? Authorizing Provider  labetalol (NORMODYNE) 300 MG tablet Take 300 mg by mouth  3 (three) times daily.   Yes Historical Provider, MD  magnesium oxide (MAG-OX) 400 MG tablet Take 400 mg by mouth daily.   Yes Historical Provider, MD  omeprazole (PRILOSEC) 20 MG capsule Take 20 mg by mouth daily.   Yes Historical Provider, MD  sodium bicarbonate 650 MG tablet Take 1,300 mg by mouth 2 (two) times daily.   Yes Historical Provider, MD  tacrolimus (PROGRAF) 1 MG capsule Take 4 mg by mouth 2 (two) times daily.   Yes Historical Provider, MD  amoxicillin-clavulanate (AUGMENTIN) 875-125 MG per tablet Take 1 tablet by mouth every 12 (twelve) hours. Patient not taking: Reported on 05/23/2014 03/16/14   Antonietta Breach, PA-C  benzonatate (TESSALON) 100 MG capsule Take 1 capsule (100 mg total) by mouth every 8 (eight) hours. Patient not taking: Reported on 05/23/2014 03/16/14   Antonietta Breach, PA-C  cephALEXin (KEFLEX) 500 MG capsule Take 1 capsule (500 mg total) by mouth 4 (four) times daily. 05/23/14   Johnna Acosta, MD  ondansetron (ZOFRAN) 4 MG tablet Take 1 tablet (4 mg total) by mouth every 6 (six) hours. 03/16/14   Antonietta Breach, PA-C   BP 122/81 mmHg  Pulse 77  Temp(Src) 97.8 F (36.6 C) (Oral)  Resp 16  SpO2 97%  LMP 04/07/2014 (Approximate) Physical Exam  Constitutional: She appears well-developed and well-nourished. No distress.  HENT:  Head: Normocephalic and atraumatic.  Mouth/Throat: Oropharynx is clear and moist. No oropharyngeal exudate.  Eyes: Conjunctivae and EOM are normal. Pupils are equal, round, and reactive to light. Right eye  exhibits no discharge. Left eye exhibits no discharge. No scleral icterus.  Neck: Normal range of motion. Neck supple. No JVD present. No thyromegaly present.  Cardiovascular: Normal rate, regular rhythm, normal heart sounds and intact distal pulses.  Exam reveals no gallop and no friction rub.   No murmur heard. Pulmonary/Chest: Effort normal and breath sounds normal. No respiratory distress. She has no wheezes. She has no rales.  Abdominal:  Soft. Bowel sounds are normal. She exhibits no distension and no mass. There is no tenderness.  Genitourinary:  Chaperone present for exam - normal appearing external genitalia - small am't of thick white d/c in vault, no bleeding, no ttp.  Musculoskeletal: Normal range of motion. She exhibits no edema or tenderness.  Lymphadenopathy:    She has no cervical adenopathy.  Neurological: She is alert. Coordination normal.  Skin: Skin is warm and dry. No rash noted. No erythema.  Psychiatric: She has a normal mood and affect. Her behavior is normal.  Nursing note and vitals reviewed.   ED Course  Procedures (including critical care time) Labs Review Labs Reviewed  CBC WITH DIFFERENTIAL - Abnormal; Notable for the following:    WBC 12.5 (*)    RDW 16.2 (*)    Neutro Abs 9.3 (*)    Monocytes Absolute 1.1 (*)    All other components within normal limits  COMPREHENSIVE METABOLIC PANEL - Abnormal; Notable for the following:    Sodium 133 (*)    CO2 18 (*)    BUN 26 (*)    Creatinine, Ser 2.38 (*)    Total Protein 8.9 (*)    GFR calc non Af Amer 27 (*)    GFR calc Af Amer 31 (*)    Anion gap 18 (*)    All other components within normal limits  URINALYSIS, ROUTINE W REFLEX MICROSCOPIC - Abnormal; Notable for the following:    APPearance TURBID (*)    Hgb urine dipstick LARGE (*)    Protein, ur >300 (*)    Leukocytes, UA LARGE (*)    All other components within normal limits  URINE MICROSCOPIC-ADD ON - Abnormal; Notable for the following:    Squamous Epithelial / LPF MANY (*)    Bacteria, UA MANY (*)    All other components within normal limits  HCG, QUANTITATIVE, PREGNANCY - Abnormal; Notable for the following:    hCG, Beta Chain, Quant, S 47703 (*)    All other components within normal limits  POC URINE PREG, ED - Abnormal; Notable for the following:    Preg Test, Ur POSITIVE (*)    All other components within normal limits  WET PREP, GENITAL  CLOSTRIDIUM DIFFICILE BY PCR   GC/CHLAMYDIA PROBE AMP  URINE CULTURE  RAPID HIV SCREEN ([facility]-MAU)  ABO/RH    Imaging Review US Ob Comp Less 14 Wks  05/23/2014   CLINICAL DATA:  Acute onset of pelvic pain.  Initial encounter.  EXAM: OBSTETRIC <14 WK [place] AND TRANSVAGINAL OB US  TECHNIQUE: Both transabdominal and transvaginal ultrasound examinations were performed for complete evaluation of the gestation as well as the maternal uterus, adnexal regions, and pelvic cul-de-sac. Transvaginal technique was performed to assess early pregnancy.  COMPARISON:  None.  FINDINGS: Intrauterine gestational sac: Visualized/normal in shape.  Yolk sac:  Yes  Embryo:  Yes  Cardiac Activity: Yes  Heart Rate: 112 bpm  CRL:   3.5  mm   6 w 0 d  [place] EDC: 01/16/2015  Maternal uterus/adnexae: A small amount of subchorionic hemorrhage is noted. The uterus is otherwise unremarkable.  The ovaries are not visualized on this study. No suspicious adnexal masses are seen.  Trace free fluid is seen within the pelvic cul-de-sac.  IMPRESSION: 1. Single live intrauterine pregnancy noted, with a crown-rump length of 3.5 mm, corresponding to a gestational age of [redacted] weeks 0 days. This matches the gestational age of [redacted] weeks 4 days by LMP, reflecting an estimated date of delivery of January 12, 2015. 2. Small amount of subchorionic hemorrhage noted.   Electronically Signed   By: Garald Balding M.D.   On: 05/23/2014 05:14   US Ob Transvaginal  05/23/2014   CLINICAL DATA:  Acute onset of pelvic pain.  Initial encounter.  EXAM: OBSTETRIC <14 WK [place] AND TRANSVAGINAL OB US  TECHNIQUE: Both transabdominal and transvaginal ultrasound examinations were performed for complete evaluation of the gestation as well as the maternal uterus, adnexal regions, and pelvic cul-de-sac. Transvaginal technique was performed to assess early pregnancy.  COMPARISON:  None.  FINDINGS: Intrauterine gestational sac: Visualized/normal in shape.  Yolk sac:  Yes  Embryo:  Yes  Cardiac Activity: Yes   Heart Rate: 112 bpm  CRL:   3.5  mm   6 w 0 d                  [place] EDC: 01/16/2015  Maternal uterus/adnexae: A small amount of subchorionic hemorrhage is noted. The uterus is otherwise unremarkable.  The ovaries are not visualized on this study. No suspicious adnexal masses are seen.  Trace free fluid is seen within the pelvic cul-de-sac.  IMPRESSION: 1. Single live intrauterine pregnancy noted, with a crown-rump length of 3.5 mm, corresponding to a gestational age of [redacted] weeks 0 days. This matches the gestational age of [redacted] weeks 4 days by LMP, reflecting an estimated date of delivery of January 12, 2015. 2. Small amount of subchorionic hemorrhage noted.   Electronically Signed   By: Garald Balding M.D.   On: 05/23/2014 05:14      MDM   Final diagnoses:  Pelvic pain affecting pregnancy  UTI (lower urinary tract infection)    No tenderness over the abdominal wall, vital signs are normal, the patient is immunocompromised and has a white blood cell count of 12,500 this could be related to the prednisone that she takes I would consider this indicative of underlying infection given her frequent diarrhea. Would also consider C. difficile colitis, otherwise labs appear to be at baseline other than a creatinine which is only mildly elevated compared to an year ago.  On repeat evaluation the patient has ongoing mild discomfort, her labs show that she is pregnant on a urine pregnancy test, bedside ultrasound reveals no obvious intrauterine pregnancy, she does have a leukocytosis, there is a urinary tract infection, antibiotics were ordered, official pelvic ultrasound and a Quant.  IUP seen on [place], Keflex given for UTI, pt stable for d/c. Wet prep neg for infection  Meds given in ED:  Medications  cefTRIAXone (ROCEPHIN) 1 g in dextrose 5 % 50 mL IVPB (0 g Intravenous Stopped 05/23/14 0528)    New Prescriptions   CEPHALEXIN (KEFLEX) 500 MG CAPSULE    Take 1 capsule (500 mg total) by mouth 4 (four) times  daily.        Johnna Acosta, MD 05/23/14 [phone]

## 2014-05-23 NOTE — ED Notes (Signed)
US at bedside

## 2014-05-24 LAB — URINE CULTURE: Colony Count: >=100000

## 2014-05-24 LAB — GC/CHLAMYDIA PROBE AMP
CT PROBE, AMP APTIMA: NEGATIVE
GC PROBE AMP APTIMA: NEGATIVE

## 2014-05-27 DIAGNOSIS — N183 Chronic kidney disease, stage 3 (moderate): Secondary | ICD-10-CM | POA: Diagnosis not present

## 2014-05-27 DIAGNOSIS — Z940 Kidney transplant status: Secondary | ICD-10-CM | POA: Diagnosis not present

## 2014-05-27 DIAGNOSIS — I129 Hypertensive chronic kidney disease with stage 1 through stage 4 chronic kidney disease, or unspecified chronic kidney disease: Secondary | ICD-10-CM | POA: Diagnosis not present

## 2014-05-27 DIAGNOSIS — N2581 Secondary hyperparathyroidism of renal origin: Secondary | ICD-10-CM | POA: Diagnosis not present

## 2014-06-02 ENCOUNTER — Ambulatory Visit (INDEPENDENT_AMBULATORY_CARE_PROVIDER_SITE_OTHER): Payer: Medicare Other | Admitting: Obstetrics & Gynecology

## 2014-06-02 ENCOUNTER — Other Ambulatory Visit (HOSPITAL_COMMUNITY)
Admission: RE | Admit: 2014-06-02 | Discharge: 2014-06-02 | Disposition: A | Discharge status: Home / Self Care | Payer: Medicare Other | Source: Ambulatory Visit | Attending: Obstetrics & Gynecology | Admitting: Obstetrics & Gynecology

## 2014-06-02 ENCOUNTER — Encounter: Payer: Self-pay | Admitting: Family Medicine

## 2014-06-02 VITALS — BP 131/84 | HR 87 | Temp 98.0°F | Wt 155.9 lb

## 2014-06-02 DIAGNOSIS — O0990 Supervision of high risk pregnancy, unspecified, unspecified trimester: Secondary | ICD-10-CM | POA: Insufficient documentation

## 2014-06-02 DIAGNOSIS — Z1151 Encounter for screening for human papillomavirus (HPV): Secondary | ICD-10-CM | POA: Diagnosis not present

## 2014-06-02 DIAGNOSIS — Z113 Encounter for screening for infections with a predominantly sexual mode of transmission: Secondary | ICD-10-CM | POA: Diagnosis not present

## 2014-06-02 DIAGNOSIS — Z124 Encounter for screening for malignant neoplasm of cervix: Secondary | ICD-10-CM | POA: Diagnosis not present

## 2014-06-02 DIAGNOSIS — Z3491 Encounter for supervision of normal pregnancy, unspecified, first trimester: Secondary | ICD-10-CM | POA: Diagnosis not present

## 2014-06-02 DIAGNOSIS — N186 End stage renal disease: Secondary | ICD-10-CM | POA: Diagnosis not present

## 2014-06-02 DIAGNOSIS — Z23 Encounter for immunization: Secondary | ICD-10-CM

## 2014-06-02 DIAGNOSIS — O0991 Supervision of high risk pregnancy, unspecified, first trimester: Secondary | ICD-10-CM | POA: Diagnosis not present

## 2014-06-02 DIAGNOSIS — R87810 Cervical high risk human papillomavirus (HPV) DNA test positive: Secondary | ICD-10-CM | POA: Diagnosis not present

## 2014-06-02 LAB — POCT URINALYSIS DIP (DEVICE)
Bilirubin Urine: NEGATIVE
GLUCOSE, UA: NEGATIVE mg/dL
Ketones, ur: NEGATIVE mg/dL
NITRITE: NEGATIVE
Protein, ur: 100 mg/dL — AB
Specific Gravity, Urine: 1.02 (ref 1.005–1.030)
UROBILINOGEN UA: 0.2 mg/dL (ref 0.0–1.0)
pH: 7 (ref 5.0–8.0)

## 2014-06-02 NOTE — Patient Instructions (Signed)
First Trimester of Pregnancy The first trimester of pregnancy is from week 1 until the end of week 12 (months 1 through 3). A week after a sperm fertilizes an egg, the egg will implant on the wall of the uterus. This embryo will begin to develop into a baby. Genes from you and your partner are forming the baby. The female genes determine whether the baby is a boy or a girl. At 6-8 weeks, the eyes and face are formed, and the heartbeat can be seen on ultrasound. At the end of 12 weeks, all the baby's organs are formed.  Now that you are pregnant, you will want to do everything you can to have a healthy baby. Two of the most important things are to get good prenatal care and to follow your health care provider's instructions. Prenatal care is all the medical care you receive before the baby's birth. This care will help prevent, find, and treat any problems during the pregnancy and childbirth. BODY CHANGES Your body goes through many changes during pregnancy. The changes vary from woman to woman.   You may gain or lose a couple of pounds at first.  You may feel sick to your stomach (nauseous) and throw up (vomit). If the vomiting is uncontrollable, call your health care provider.  You may tire easily.  You may develop headaches that can be relieved by medicines approved by your health care provider.  You may urinate more often. Painful urination may mean you have a bladder infection.  You may develop heartburn as a result of your pregnancy.  You may develop constipation because certain hormones are causing the muscles that push waste through your intestines to slow down.  You may develop hemorrhoids or swollen, bulging veins (varicose veins).  Your breasts may begin to grow larger and become tender. Your nipples may stick out more, and the tissue that surrounds them (areola) may become darker.  Your gums may bleed and may be sensitive to brushing and flossing.  Dark spots or blotches (chloasma,  mask of pregnancy) may develop on your face. This will likely fade after the baby is born.  Your menstrual periods will stop.  You may have a loss of appetite.  You may develop cravings for certain kinds of food.  You may have changes in your emotions from day to day, such as being excited to be pregnant or being concerned that something may go wrong with the pregnancy and baby.  You may have more vivid and strange dreams.  You may have changes in your hair. These can include thickening of your hair, rapid growth, and changes in texture. Some women also have hair loss during or after pregnancy, or hair that feels dry or thin. Your hair will most likely return to normal after your baby is born. WHAT TO EXPECT AT YOUR PRENATAL VISITS During a routine prenatal visit:  You will be weighed to make sure you and the baby are growing normally.  Your blood pressure will be taken.  Your abdomen will be measured to track your baby's growth.  The fetal heartbeat will be listened to starting around week 10 or 12 of your pregnancy.  Test results from any previous visits will be discussed. Your health care provider may ask you:  How you are feeling.  If you are feeling the baby move.  If you have had any abnormal symptoms, such as leaking fluid, bleeding, severe headaches, or abdominal cramping.  If you have any questions. Other tests   that may be performed during your first trimester include:  Blood tests to find your blood type and to check for the presence of any previous infections. They will also be used to check for low iron levels (anemia) and Rh antibodies. Later in the pregnancy, blood tests for diabetes will be done along with other tests if problems develop.  Urine tests to check for infections, diabetes, or protein in the urine.  An ultrasound to confirm the proper growth and development of the baby.  An amniocentesis to check for possible genetic problems.  Fetal screens for  spina bifida and Down syndrome.  You may need other tests to make sure you and the baby are doing well. HOME CARE INSTRUCTIONS  Medicines  Follow your health care provider's instructions regarding medicine use. Specific medicines may be either safe or unsafe to take during pregnancy.  Take your prenatal vitamins as directed.  If you develop constipation, try taking a stool softener if your health care provider approves. Diet  Eat regular, well-balanced meals. Choose a variety of foods, such as meat or vegetable-based protein, fish, milk and low-fat dairy products, vegetables, fruits, and whole grain breads and cereals. Your health care provider will help you determine the amount of weight gain that is right for you.  Avoid raw meat and uncooked cheese. These carry germs that can cause birth defects in the baby.  Eating four or five small meals rather than three large meals a day may help relieve nausea and vomiting. If you start to feel nauseous, eating a few soda crackers can be helpful. Drinking liquids between meals instead of during meals also seems to help nausea and vomiting.  If you develop constipation, eat more high-fiber foods, such as fresh vegetables or fruit and whole grains. Drink enough fluids to keep your urine clear or pale yellow. Activity and Exercise  Exercise only as directed by your health care provider. Exercising will help you:  Control your weight.  Stay in shape.  Be prepared for labor and delivery.  Experiencing pain or cramping in the lower abdomen or low back is a good sign that you should stop exercising. Check with your health care provider before continuing normal exercises.  Try to avoid standing for long periods of time. Move your legs often if you must stand in one place for a long time.  Avoid heavy lifting.  Wear low-heeled shoes, and practice good posture.  You may continue to have sex unless your health care provider directs you  otherwise. Relief of Pain or Discomfort  Wear a good support bra for breast tenderness.   Take warm sitz baths to soothe any pain or discomfort caused by hemorrhoids. Use hemorrhoid cream if your health care provider approves.   Rest with your legs elevated if you have leg cramps or low back pain.  If you develop varicose veins in your legs, wear support hose. Elevate your feet for 15 minutes, 3-4 times a day. Limit salt in your diet. Prenatal Care  Schedule your prenatal visits by the twelfth week of pregnancy. They are usually scheduled monthly at first, then more often in the last 2 months before delivery.  Write down your questions. Take them to your prenatal visits.  Keep all your prenatal visits as directed by your health care provider. Safety  Wear your seat belt at all times when driving.  Make a list of emergency phone numbers, including numbers for family, friends, the hospital, and police and fire departments. General Tips    Ask your health care provider for a referral to a local prenatal education class. Begin classes no later than at the beginning of month 6 of your pregnancy.  Ask for help if you have counseling or nutritional needs during pregnancy. Your health care provider can offer advice or refer you to specialists for help with various needs.  Do not use hot tubs, steam rooms, or saunas.  Do not douche or use tampons or scented sanitary pads.  Do not cross your legs for long periods of time.  Avoid cat litter boxes and soil used by cats. These carry germs that can cause birth defects in the baby and possibly loss of the fetus by miscarriage or stillbirth.  Avoid all smoking, herbs, alcohol, and medicines not prescribed by your health care provider. Chemicals in these affect the formation and growth of the baby.  Schedule a dentist appointment. At home, brush your teeth with a soft toothbrush and be gentle when you floss. SEEK MEDICAL CARE IF:   You have  dizziness.  You have mild pelvic cramps, pelvic pressure, or nagging pain in the abdominal area.  You have persistent nausea, vomiting, or diarrhea.  You have a bad smelling vaginal discharge.  You have pain with urination.  You notice increased swelling in your face, hands, legs, or ankles. SEEK IMMEDIATE MEDICAL CARE IF:   You have a fever.  You are leaking fluid from your vagina.  You have spotting or bleeding from your vagina.  You have severe abdominal cramping or pain.  You have rapid weight gain or loss.  You vomit blood or material that looks like coffee grounds.  You are exposed to German measles and have never had them.  You are exposed to fifth disease or chickenpox.  You develop a severe headache.  You have shortness of breath.  You have any kind of trauma, such as from a fall or a car accident. Document Released: 06/04/2001 Document Revised: 10/25/2013 Document Reviewed: 04/20/2013 Exit[employer] Patient Information 2015 [employer]. This information is not intended to replace advice given to you by your health care provider. Make sure you discuss any questions you have with your health care provider.  

## 2014-06-02 NOTE — Progress Notes (Signed)
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 Sees nephrologist [facility], transplant surgeon at [facility]  Subjective: [name], renal transplant patient 2012    Kerry Cook is a G30P0 [redacted]w[redacted]d being seen today for her first obstetrical visit.  Her obstetrical history is significant for HTN and ESRD s/p renal transplant. Patient does intend to breast feed. Pregnancy history fully reviewed.  Patient reports nausea.  Filed Vitals:   06/02/14 1005  BP: 131/84  Pulse: 87  Temp: 98 F (36.7 C)  Weight: 155 lb 14.4 oz (70.716 kg)    HISTORY: OB History  Gravida Para Term Preterm AB SAB TAB Ectopic Multiple Living  1             # Outcome Date GA Lbr Len/2nd Weight Sex Delivery Anes PTL Lv  1 Current              Past Medical History  Diagnosis Date  . Hypertension   . 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 Kidney transplant Bilateral 2012   History reviewed. No pertinent family history.   Exam    Uterus:  Fundal Height: 8 cm  Pelvic Exam:    Perineum: No Hemorrhoids   Vulva: normal   Vagina:  normal mucosa   pH:     Cervix: no lesions   Adnexa: normal adnexa   Bony Pelvis: average  System: Breast:  normal appearance, no masses or tenderness   Skin: normal coloration and turgor, no rashes    Neurologic: oriented, normal mood   Extremities: normal strength, tone, and muscle mass   HEENT thyroid without masses and trachea midline   Mouth/Teeth mucous membranes moist, pharynx normal without lesions and dental hygiene good   Neck supple   Cardiovascular: regular rate and rhythm, no murmurs or gallops   Respiratory:  appears well, vitals normal, no respiratory distress, acyanotic, normal RR, chest clear, no wheezing, crepitations, rhonchi, normal symmetric air entry   Abdomen: soft, non-tender; bowel sounds normal; no masses,  no organomegaly   Urinary: urethral meatus  normal      Assessment:    Pregnancy: G1P0 Patient Active Problem List   Diagnosis Date Noted  . Supervision of high risk pregnancy, antepartum 06/02/2014  . ANEMIA-NOS 10/01/2007  . THROMBOCYTOPENIA 10/01/2007  . END STAGE RENAL DISEASE 10/01/2007  . PAP SMEAR, LGSIL, ABNORMAL 10/01/2007  . HYPERTENSION, BENIGN SYSTEMIC 08/21/2006  . PROTEINURIA 08/21/2006        Plan:     Initial labs drawn. Prenatal vitamins. Problem list reviewed and updated. Genetic Screening discussed First Screen: 12-14 weeks.  Ultrasound discussed; fetal survey: ordered. For viability now. [place] by me could not detect FHM transabdominal  Follow up in 4 weeks. 50% of 30 min visit spent on counseling and coordination of care.  [place] consult   ARNOLD,JAMES 06/02/2014

## 2014-06-02 NOTE — Progress Notes (Signed)
U/S for viability with Radiology 06/07/14 @ 915a.  [place] consult 06/07/14 @ 11a.

## 2014-06-02 NOTE — Progress Notes (Signed)
Initial visit-- patient found out she was pregnant two weeks ago in ED.  New Ob labs today and pap.  Discussed appropriate weight gain (15-25lb) patient verbalized understanding.  Would like flu shot today.

## 2014-06-03 LAB — PRENATAL PROFILE (SOLSTAS)
Antibody Screen: NEGATIVE
Basophils Absolute: 0 10*3/uL (ref 0.0–0.1)
Basophils Relative: 0 % (ref 0–1)
Eosinophils Absolute: 0.1 10*3/uL (ref 0.0–0.7)
Eosinophils Relative: 1 % (ref 0–5)
HCT: 35.5 % — ABNORMAL LOW (ref 36.0–46.0)
HEMOGLOBIN: 11.9 g/dL — AB (ref 12.0–15.0)
HEP B S AG: NEGATIVE
HIV 1&2 Ab, 4th Generation: NONREACTIVE
LYMPHS ABS: 1.1 10*3/uL (ref 0.7–4.0)
LYMPHS PCT: 10 % — AB (ref 12–46)
MCH: 27.9 pg (ref 26.0–34.0)
MCHC: 33.5 g/dL (ref 30.0–36.0)
MCV: 83.3 fL (ref 78.0–100.0)
MPV: 10.7 fL (ref 9.4–12.4)
Monocytes Absolute: 0.6 10*3/uL (ref 0.1–1.0)
Monocytes Relative: 6 % (ref 3–12)
NEUTROS ABS: 8.7 10*3/uL — AB (ref 1.7–7.7)
Neutrophils Relative %: 83 % — ABNORMAL HIGH (ref 43–77)
Platelets: 233 10*3/uL (ref 150–400)
RBC: 4.26 MIL/uL (ref 3.87–5.11)
RDW: 17 % — ABNORMAL HIGH (ref 11.5–15.5)
RUBELLA: 6.61 {index} — AB (ref <0.90)
Rh Type: POSITIVE
WBC: 10.5 10*3/uL (ref 4.0–10.5)

## 2014-06-03 LAB — CYTOLOGY - PAP

## 2014-06-04 LAB — PRESCRIPTION MONITORING PROFILE (19 PANEL)
AMPHETAMINE/METH: NEGATIVE ng/mL
BARBITURATE SCREEN, URINE: NEGATIVE ng/mL
Benzodiazepine Screen, Urine: NEGATIVE ng/mL
Buprenorphine, Urine: NEGATIVE ng/mL
CANNABINOID SCRN UR: NEGATIVE ng/mL
Carisoprodol, Urine: NEGATIVE ng/mL
Cocaine Metabolites: NEGATIVE ng/mL
Creatinine, Urine: 190.24 mg/dL (ref >20.0)
Fentanyl, Ur: NEGATIVE ng/mL
MDMA URINE: NEGATIVE ng/mL
Meperidine, Ur: NEGATIVE ng/mL
Methadone Screen, Urine: NEGATIVE ng/mL
Methaqualone: NEGATIVE ng/mL
Nitrites, Initial: NEGATIVE ug/mL
OPIATE SCREEN, URINE: NEGATIVE ng/mL
OXYCODONE SCRN UR: NEGATIVE ng/mL
Phencyclidine, Ur: NEGATIVE ng/mL
Propoxyphene: NEGATIVE ng/mL
TAPENTADOLUR: NEGATIVE ng/mL
Tramadol Scrn, Ur: NEGATIVE ng/mL
Zolpidem, Urine: NEGATIVE ng/mL
pH, Initial: 7.6 pH (ref 4.5–8.9)

## 2014-06-04 LAB — CULTURE, OB URINE
COLONY COUNT: NO GROWTH
ORGANISM ID, BACTERIA: NO GROWTH

## 2014-06-07 ENCOUNTER — Ambulatory Visit (HOSPITAL_COMMUNITY): Payer: Medicare Other

## 2014-06-07 ENCOUNTER — Telehealth: Payer: Self-pay

## 2014-06-07 ENCOUNTER — Ambulatory Visit (HOSPITAL_COMMUNITY)
Admission: RE | Admit: 2014-06-07 | Discharge: 2014-06-07 | Disposition: A | Discharge status: Home / Self Care | Payer: Medicare Other | Source: Ambulatory Visit | Attending: Obstetrics & Gynecology | Admitting: Obstetrics & Gynecology

## 2014-06-07 DIAGNOSIS — N186 End stage renal disease: Secondary | ICD-10-CM

## 2014-06-07 DIAGNOSIS — Z3A01 Less than 8 weeks gestation of pregnancy: Secondary | ICD-10-CM | POA: Insufficient documentation

## 2014-06-07 DIAGNOSIS — O0991 Supervision of high risk pregnancy, unspecified, first trimester: Secondary | ICD-10-CM

## 2014-06-07 DIAGNOSIS — O26831 Pregnancy related renal disease, first trimester: Secondary | ICD-10-CM | POA: Diagnosis not present

## 2014-06-07 NOTE — Telephone Encounter (Signed)
Received call from [place] at [facility] with report of patient's OB U/S. Per report, U/S shows single IUP without evidence of cardiac activity-- suspicious for but not definitive failed pregnancy. Recommend follow up in 10-14 days.   Patient noted to have MFM consult today at 1100. Called and spoke to RN Anda Kraft who was already informed of patient. Anda Kraft reports speaking to Dr. Harolyn Rutherford who stated Dr. Roselie Awkward has spoken to patient who has decided to do expectant management. No F/u ultrasound to be scheduled. Patient to follow up in clinic. No MFM appointment today. Called Dr. Harolyn Rutherford who states patient should have appointment sometime next week for follow up in clinic. Message sent to admin pool to schedule and call patient with appointment.

## 2014-06-09 ENCOUNTER — Encounter: Payer: Self-pay | Admitting: Obstetrics & Gynecology

## 2014-06-09 ENCOUNTER — Ambulatory Visit (INDEPENDENT_AMBULATORY_CARE_PROVIDER_SITE_OTHER): Payer: Medicare Other | Admitting: Obstetrics & Gynecology

## 2014-06-09 VITALS — BP 131/84 | HR 84 | Temp 98.4°F | Ht 62.0 in | Wt 153.8 lb

## 2014-06-09 DIAGNOSIS — O200 Threatened abortion: Secondary | ICD-10-CM

## 2014-06-09 NOTE — Addendum Note (Signed)
Addended by: Shelly Coss on: 06/09/2014 02:42 PM   Modules accepted: Medications

## 2014-06-09 NOTE — Progress Notes (Signed)
   Subjective:    Patient ID: Kerry Cook, female    DOB: 02-26-1986, 28 y.o.   MRN: [id number]  HPI  28 yo S AA G1 here to discuss her u/s findings. She denies pain or bleeding. Her u/s done 2 days ago showed a fetus but no heartbeat and a probably hydropic yolk sac.  Review of Systems     Objective:   Physical Exam        Assessment & Plan:  Schedule follow up u/s in 10 days.

## 2014-06-20 ENCOUNTER — Telehealth: Payer: Self-pay | Admitting: General Practice

## 2014-06-20 ENCOUNTER — Ambulatory Visit (HOSPITAL_COMMUNITY): Payer: Medicare Other

## 2014-06-20 NOTE — Telephone Encounter (Signed)
Patient already had follow up appt scheduled with [facility] for 07/01/14. Called patient, no answer- Left message that we are trying to reach you with some information, please call us back at the clinics

## 2014-06-20 NOTE — Telephone Encounter (Signed)
-----   Message from Woodroe Mode, MD sent at 06/16/2014 10:09 PM EST ----- Needs colpo

## 2014-06-21 NOTE — Telephone Encounter (Signed)
Called patient and informed her of results and recommendations and that we would do the procedure at her 07/01/13 appt. Patient verbalized understanding and had no questions

## 2014-06-22 ENCOUNTER — Ambulatory Visit (HOSPITAL_COMMUNITY)
Admission: RE | Admit: 2014-06-22 | Discharge: 2014-06-22 | Disposition: A | Discharge status: Home / Self Care | Payer: Medicare Other | Source: Ambulatory Visit | Attending: Obstetrics & Gynecology | Admitting: Obstetrics & Gynecology

## 2014-06-22 DIAGNOSIS — O200 Threatened abortion: Secondary | ICD-10-CM

## 2014-06-26 ENCOUNTER — Inpatient Hospital Stay (HOSPITAL_COMMUNITY)
Admission: AD | Admit: 2014-06-26 | Discharge: 2014-06-27 | Disposition: A | Discharge status: Home / Self Care | Payer: Medicare Other | Source: Ambulatory Visit | Attending: Obstetrics & Gynecology | Admitting: Obstetrics & Gynecology

## 2014-06-26 ENCOUNTER — Encounter (HOSPITAL_COMMUNITY): Payer: Self-pay | Admitting: *Deleted

## 2014-06-26 ENCOUNTER — Inpatient Hospital Stay (HOSPITAL_COMMUNITY): Payer: Medicare Other

## 2014-06-26 DIAGNOSIS — Z940 Kidney transplant status: Secondary | ICD-10-CM | POA: Diagnosis not present

## 2014-06-26 DIAGNOSIS — O034 Incomplete spontaneous abortion without complication: Secondary | ICD-10-CM

## 2014-06-26 DIAGNOSIS — O039 Complete or unspecified spontaneous abortion without complication: Secondary | ICD-10-CM | POA: Insufficient documentation

## 2014-06-26 DIAGNOSIS — R1030 Lower abdominal pain, unspecified: Secondary | ICD-10-CM | POA: Diagnosis present

## 2014-06-26 DIAGNOSIS — Z3A09 9 weeks gestation of pregnancy: Secondary | ICD-10-CM | POA: Diagnosis not present

## 2014-06-26 DIAGNOSIS — O209 Hemorrhage in early pregnancy, unspecified: Secondary | ICD-10-CM

## 2014-06-26 LAB — CBC
HCT: 35.9 % — ABNORMAL LOW (ref 36.0–46.0)
Hemoglobin: 11.8 g/dL — ABNORMAL LOW (ref 12.0–15.0)
MCH: 28.3 pg (ref 26.0–34.0)
MCHC: 32.9 g/dL (ref 30.0–36.0)
MCV: 86.1 fL (ref 78.0–100.0)
Platelets: 320 10*3/uL (ref 150–400)
RBC: 4.17 MIL/uL (ref 3.87–5.11)
RDW: 16.6 % — AB (ref 11.5–15.5)
WBC: 10.6 10*3/uL — ABNORMAL HIGH (ref 4.0–10.5)

## 2014-06-26 LAB — URINALYSIS, ROUTINE W REFLEX MICROSCOPIC
GLUCOSE, UA: NEGATIVE mg/dL
KETONES UR: 15 mg/dL — AB
NITRITE: NEGATIVE
PH: 7 (ref 5.0–8.0)
Protein, ur: 100 mg/dL — AB
Specific Gravity, Urine: 1.025 (ref 1.005–1.030)
UROBILINOGEN UA: 0.2 mg/dL (ref 0.0–1.0)

## 2014-06-26 LAB — URINE MICROSCOPIC-ADD ON

## 2014-06-26 MED ORDER — MISOPROSTOL 200 MCG PO TABS: ORAL_TABLET | ORAL | Status: DC

## 2014-06-26 MED ORDER — IBUPROFEN 600 MG PO TABS: 600.0000 mg | ORAL_TABLET | Freq: Three times a day (TID) | ORAL | Status: DC | PRN

## 2014-06-26 MED ORDER — HYDROMORPHONE HCL 1 MG/ML IJ SOLN
1.0000 mg | Freq: Once | INTRAMUSCULAR | Status: AC
  Administered 2014-06-26: 1 mg via INTRAMUSCULAR
  Filled 2014-06-26: qty 1

## 2014-06-26 MED ORDER — NITROFURANTOIN MONOHYD MACRO 100 MG PO CAPS: 100.0000 mg | ORAL_CAPSULE | Freq: Two times a day (BID) | ORAL | Status: DC

## 2014-06-26 MED ORDER — ACETAMINOPHEN-CODEINE 300-30 MG PO TABS
1.0000 | ORAL_TABLET | Freq: Four times a day (QID) | ORAL | Status: DC | PRN
Start: 2014-06-26 — End: 2015-01-20

## 2014-06-26 NOTE — Discharge Instructions (Signed)
Incomplete Miscarriage A miscarriage is the sudden loss of an unborn baby (fetus) before the 20th week of pregnancy. In an incomplete miscarriage, parts of the fetus or placenta (afterbirth) remain in the body.  Having a miscarriage can be an emotional experience. Talk with your health care provider about any questions you may have about miscarrying, the grieving process, and your future pregnancy plans. CAUSES   Problems with the fetal chromosomes that make it impossible for the baby to develop normally. Problems with the baby's genes or chromosomes are most often the result of errors that occur by chance as the embryo divides and grows. The problems are not inherited from the parents.  Infection of the cervix or uterus.  Hormone problems.  Problems with the cervix, such as having an incompetent cervix. This is when the tissue in the cervix is not strong enough to hold the pregnancy.  Problems with the uterus, such as an abnormally shaped uterus, uterine fibroids, or congenital abnormalities.  Certain medical conditions.  Smoking, drinking alcohol, or taking illegal drugs.  Trauma. SYMPTOMS   Vaginal bleeding or spotting, with or without cramps or pain.  Pain or cramping in the abdomen or lower back.  Passing fluid, tissue, or blood clots from the vagina. DIAGNOSIS  Your health care provider will perform a physical exam. You may also have an ultrasound to confirm the miscarriage. Blood or urine tests may also be ordered. TREATMENT   Usually, a dilation and curettage (D&C) procedure is performed. During a D&C procedure, the cervix is widened (dilated) and any remaining fetal or placental tissue is gently removed from the uterus.  Antibiotic medicines are prescribed if there is an infection. Other medicines may be given to reduce the size of the uterus (contract) if there is a lot of bleeding.  If you have Rh negative blood and your baby was Rh positive, you will need a Rho (D)  immune globulin shot. This shot will protect any future baby from having Rh blood problems in future pregnancies.  You may be confined to bed rest. This means you should stay in bed and only get up to use the bathroom. HOME CARE INSTRUCTIONS   Rest as directed by your health care provider.  Restrict activity as directed by your health care provider. You may be allowed to continue light activity if curettage was not done but you require further treatment.  Keep track of the number of pads you use each day. Keep track of how soaked (saturated) they are. Record this information.  Do not  use tampons.  Do not douche or have sexual intercourse until approved by your health care provider.  Keep all follow-up appointments for reevaluation and continuing management.  Only take over-the-counter or prescription medicines for pain, fever, or discomfort as directed by your health care provider.  Take antibiotic medicine as directed by your health care provider. Make sure you finish it even if you start to feel better. SEEK IMMEDIATE MEDICAL CARE IF:   You experience severe cramps in your stomach, back, or abdomen.  You have an unexplained temperature (make sure to record these temperatures).  You pass large clots or tissue (save these for your health care provider to inspect).  Your bleeding increases.  You become light-headed, weak, or have fainting episodes. MAKE SURE YOU:   Understand these instructions.  Will watch your condition.  Will get help right away if you are not doing well or get worse. Document Released: 06/10/2005 Document Revised: 10/25/2013 Document Reviewed:   01/07/2013 Exit[employer] Patient Information 2015 [employer]. This information is not intended to replace advice given to you by your health care provider. Make sure you discuss any questions you have with your health care provider.  

## 2014-06-26 NOTE — MAU Provider Note (Signed)
History     CSN: [id number]  Arrival date and time: 06/26/14 2028   First Provider Initiated Contact with Patient 06/26/14 2128      Chief Complaint  Patient presents with  . Vaginal Bleeding  . Dysmenorrhea   HPI  Pt is a 29 yo G1P0 at [redacted]w[redacted]d wks IUP by 6 wk ultrasound here with report of lower pelvic pain that started 3 days ago.  Pain intensified at 1700 today.  Pain is described as a constant sharp pain rated a 10/10.  Also reports vaginal bleeding that started today.  Pt reports passing two small clots the size of grapes.    Past Medical History  Diagnosis Date  . Hypertension   . 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 Kidney transplant Bilateral 2012    History reviewed. No pertinent family history.  History  Substance Use Topics  . Smoking status: Never Smoker   . Smokeless tobacco: Never Used  . Alcohol Use: No    Allergies: No Known Allergies  Prescriptions prior to admission  Medication Sig Dispense Refill Last Dose  . labetalol (NORMODYNE) 300 MG tablet Take 300 mg by mouth 3 (three) times daily.   06/26/2014 at 1000  . magnesium oxide (MAG-OX) 400 MG tablet Take 400 mg by mouth daily.   06/26/2014 at Unknown time  . omeprazole (PRILOSEC) 20 MG capsule Take 20 mg by mouth daily.   06/26/2014 at Unknown time  . ondansetron (ZOFRAN) 4 MG tablet Take 1 tablet (4 mg total) by mouth every 6 (six) hours. (Patient taking differently: Take 4 mg by mouth every 6 (six) hours as needed for nausea or vomiting. ) 12 tablet 0 06/26/2014 at Unknown time  . sodium bicarbonate 650 MG tablet Take 650 mg by mouth 3 (three) times daily.    06/26/2014 at Unknown time  . tacrolimus (PROGRAF) 1 MG capsule Take 4 mg by mouth 2 (two) times daily.   06/26/2014 at Unknown time  . cephALEXin (KEFLEX) 500 MG capsule Take 1 capsule (500 mg total) by mouth 4 (four) times daily. (Patient not taking: Reported on  06/26/2014) 40 capsule 0 Taking    Review of Systems  Gastrointestinal: Positive for abdominal pain. Negative for nausea and vomiting.  Genitourinary:       Vaginal bleeding  All other systems reviewed and are negative.  Physical Exam   Blood pressure 125/86, pulse 84, temperature 98.7 F (37.1 C), temperature source Oral, resp. rate 18, height 5\' 2"  (1.575 m), weight 68.947 kg (152 lb), last menstrual period 04/07/2014.  Physical Exam  Constitutional: She is oriented to person, place, and time. She appears well-developed and well-nourished.  Appears uncomfortable at bedside.    HENT:  Head: Normocephalic.  Neck: Normal range of motion. Neck supple.  Cardiovascular: Normal rate, regular rhythm and normal heart sounds.   Respiratory: Effort normal and breath sounds normal. No respiratory distress.  GI: Soft. She exhibits no mass. There is no tenderness. There is no rebound and no guarding.  Genitourinary: There is bleeding ( moderate clots; some teased out cervix) in the vagina.  Neurological: She is alert and oriented to person, place, and time.  Skin: Skin is warm and dry.    MAU Course  Procedures  2145 Dilaudid 1 mg IM given  Results for orders placed or performed during the hospital encounter of 06/26/14 (from the past 24 hour(s))  Urinalysis, Routine w reflex microscopic     Status: Abnormal   Collection Time: 06/26/14  8:35 PM  Result Value Ref Range   Color, Urine YELLOW YELLOW   APPearance CLOUDY (A) CLEAR   Specific Gravity, Urine 1.025 1.005 - 1.030   pH 7.0 5.0 - 8.0   Glucose, UA NEGATIVE NEGATIVE mg/dL   Hgb urine dipstick LARGE (A) NEGATIVE   Bilirubin Urine SMALL (A) NEGATIVE   Ketones, ur 15 (A) NEGATIVE mg/dL   Protein, ur 100 (A) NEGATIVE mg/dL   Urobilinogen, UA 0.2 0.0 - 1.0 mg/dL   Nitrite NEGATIVE NEGATIVE   Leukocytes, UA MODERATE (A) NEGATIVE  Urine microscopic-add on     Status: Abnormal   Collection Time: 06/26/14  8:35 PM  Result Value Ref  Range   Squamous Epithelial / LPF FEW (A) RARE   WBC, UA 11-20 <3 WBC/hpf   RBC / HPF TOO NUMEROUS TO COUNT <3 RBC/hpf   Bacteria, UA MANY (A) RARE  CBC     Status: Abnormal   Collection Time: 06/26/14  9:55 PM  Result Value Ref Range   WBC 10.6 (H) 4.0 - 10.5 K/uL   RBC 4.17 3.87 - 5.11 MIL/uL   Hemoglobin 11.8 (L) 12.0 - 15.0 g/dL   HCT 35.9 (L) 36.0 - 46.0 %   MCV 86.1 78.0 - 100.0 fL   MCH 28.3 26.0 - 34.0 pg   MCHC 32.9 30.0 - 36.0 g/dL   RDW 16.6 (H) 11.5 - 15.5 %   Platelets 320 150 - 400 K/uL   Ultrasound: IMPRESSION: Elongated gestational sac noted extending across the cervix. No embryo seen. Findings are compatible with spontaneous abortion in [place].  2345 Pt reports improvement in pain. [place] with Dr. Ihor Dow > reviewed HPI/exam/labs/ultrasound > discharge home with cytotec  Assessment and Plan  Incomplete Spontaneous Abortion UTI  Plan: Discharge to home RX Macrobid 100 BID x 7 days Urine culture to lab  Early Intrauterine Pregnancy Failure Protocol X  Documented intrauterine pregnancy failure less than or equal to [redacted] weeks   gestation  X  No serious current illness  X  Baseline Hgb greater than or equal to 10g/dl  X  Patient has easily accessible transportation to the hospital  X  Clear preference  X  Practitioner/physician deems patient reliable  X  Counseling by practitioner or physician  X  Consent form signed  X  Cytotec 800 mcg Intravaginally by patient at home  X   Ibuprofen 600 mg 1 tablet by mouth every 6 hours as needed #30 - prescribed  X   Tylenol #3 mg by mouth every 4 to 6 hours as needed - prescribed  X   Phenergan 12.5 mg by mouth every 4 hours as needed for nausea - prescribed  Reviewed with pt cytotec procedure.  Pt verbalizes that she lives close to the hospital and has transportation readily available.  Pt appears reliable and verbalizes understanding and agrees with plan of care  Follow-up in [facility] in two weeks  [place], [facility] 06/26/2014, 9:30 PM

## 2014-06-26 NOTE — MAU Note (Signed)
Pt states that she began cramping 2 days ago and then bleeding began this afternoon.

## 2014-06-27 ENCOUNTER — Encounter (HOSPITAL_COMMUNITY): Payer: Self-pay | Admitting: *Deleted

## 2014-06-27 ENCOUNTER — Inpatient Hospital Stay (EMERGENCY_DEPARTMENT_HOSPITAL)
Admission: AD | Admit: 2014-06-27 | Discharge: 2014-06-27 | Disposition: A | Discharge status: Home / Self Care | Payer: Medicare Other | Source: Ambulatory Visit | Attending: Family Medicine | Admitting: Family Medicine

## 2014-06-27 DIAGNOSIS — O039 Complete or unspecified spontaneous abortion without complication: Secondary | ICD-10-CM

## 2014-06-27 HISTORY — DX: Chronic kidney disease, unspecified: N18.9

## 2014-06-27 LAB — CBC
HCT: 32.9 % — ABNORMAL LOW (ref 36.0–46.0)
Hemoglobin: 10.7 g/dL — ABNORMAL LOW (ref 12.0–15.0)
MCH: 28 pg (ref 26.0–34.0)
MCHC: 32.5 g/dL (ref 30.0–36.0)
MCV: 86.1 fL (ref 78.0–100.0)
PLATELETS: 285 10*3/uL (ref 150–400)
RBC: 3.82 MIL/uL — ABNORMAL LOW (ref 3.87–5.11)
RDW: 16.2 % — AB (ref 11.5–15.5)
WBC: 18.3 10*3/uL — ABNORMAL HIGH (ref 4.0–10.5)

## 2014-06-27 MED ORDER — IBUPROFEN 800 MG PO TABS
800.0000 mg | ORAL_TABLET | Freq: Once | ORAL | Status: AC
  Administered 2014-06-27: 800 mg via ORAL
  Filled 2014-06-27: qty 1

## 2014-06-27 MED ORDER — OXYCODONE-ACETAMINOPHEN 5-325 MG PO TABS: 1.0000 | ORAL_TABLET | ORAL | Status: DC | PRN

## 2014-06-27 MED ORDER — HYDROMORPHONE HCL 1 MG/ML IJ SOLN
1.0000 mg | Freq: Once | INTRAMUSCULAR | Status: AC
  Administered 2014-06-27: 1 mg via INTRAVENOUS
  Filled 2014-06-27: qty 1

## 2014-06-27 MED ORDER — OXYCODONE-ACETAMINOPHEN 5-325 MG PO TABS
1.0000 | ORAL_TABLET | Freq: Once | ORAL | Status: AC
  Administered 2014-06-27: 1 via ORAL
  Filled 2014-06-27: qty 1

## 2014-06-27 MED ORDER — LACTATED RINGERS IV BOLUS (SEPSIS)
1000.0000 mL | Freq: Once | INTRAVENOUS | Status: AC
  Administered 2014-06-27: 1000 mL via INTRAVENOUS

## 2014-06-27 NOTE — MAU Provider Note (Signed)
History     CSN: [id number]  Arrival date and time: 06/27/14 1859   First Provider Initiated Contact with Patient 06/27/14 2052      Chief Complaint  Patient presents with  . Miscarriage   HPI  Kerry Cook is a 29 y.o. G1P0 at [redacted]w[redacted]d who presents today with 10/10 lower abdominal pain. She had cytotec yesterday for a miscarriage. She states that around 1600 today she started to have "intense cramping" and increased vaginal bleeding. She is unsure if she has passed any tissue, however she states that she has passed several large clots the size of a ping pong ball.   Past Medical History  Diagnosis Date  . Hypertension   . History of kidney transplant 2012    kidney failure due to hypertension  . Chronic kidney disease     previous hx dialysis    Past Surgical History  Procedure Laterality Date  . Kidney transplant Bilateral 2012    History reviewed. No pertinent family history.  History  Substance Use Topics  . Smoking status: Never Smoker   . Smokeless tobacco: Never Used  . Alcohol Use: No    Allergies: No Known Allergies  Prescriptions prior to admission  Medication Sig Dispense Refill Last Dose  . Acetaminophen-Codeine (TYLENOL/CODEINE #3) 300-30 MG per tablet Take 1 tablet by mouth every 6 (six) hours as needed for pain. 15 tablet 0 06/27/2014 at 1500  . ibuprofen (ADVIL,MOTRIN) 600 MG tablet Take 1 tablet (600 mg total) by mouth every 8 (eight) hours as needed for cramping. 30 tablet 0 06/27/2014 at 1400  . labetalol (NORMODYNE) 300 MG tablet Take 300 mg by mouth 3 (three) times daily.   06/27/2014 at 1000  . magnesium oxide (MAG-OX) 400 MG tablet Take 400 mg by mouth daily.   06/27/2014 at Unknown time  . misoprostol (CYTOTEC) 200 MCG tablet Place four pills in vagina x one. 4 tablet 0 06/27/2014 at 1200  . nitrofurantoin, macrocrystal-monohydrate, (MACROBID) 100 MG capsule Take 1 capsule (100 mg total) by mouth 2 (two) times daily. 14 capsule 0 06/27/2014 at Unknown  time  . omeprazole (PRILOSEC) 20 MG capsule Take 20 mg by mouth daily.   06/27/2014 at Unknown time  . sodium bicarbonate 650 MG tablet Take 650 mg by mouth 3 (three) times daily.    06/27/2014 at Unknown time  . tacrolimus (PROGRAF) 1 MG capsule Take 4 mg by mouth 2 (two) times daily.   06/27/2014 at Unknown time    ROS Physical Exam   Blood pressure 121/83, pulse 72, temperature 98.2 F (36.8 C), temperature source Oral, resp. rate 16, last menstrual period 04/07/2014, SpO2 100 %.  Physical Exam  Nursing note and vitals reviewed. Constitutional: She is oriented to person, place, and time. She appears well-developed and well-nourished. No distress.  Cardiovascular: Normal rate.   Respiratory: Effort normal.  GI: Soft. There is no tenderness. There is no rebound.  Neurological: She is alert and oriented to person, place, and time.  Skin: Skin is warm and dry.  Psychiatric: She has a normal mood and affect.    MAU Course  Procedures  Results for orders placed or performed during the hospital encounter of 06/27/14 (from the past 24 hour(s))  CBC     Status: Abnormal   Collection Time: 06/27/14  7:45 PM  Result Value Ref Range   WBC 18.3 (H) 4.0 - 10.5 K/uL   RBC 3.82 (L) 3.87 - 5.11 MIL/uL   Hemoglobin 10.7 (L) 12.0 -  15.0 g/dL   HCT 32.9 (L) 36.0 - 46.0 %   MCV 86.1 78.0 - 100.0 fL   MCH 28.0 26.0 - 34.0 pg   MCHC 32.5 30.0 - 36.0 g/dL   RDW 16.2 (H) 11.5 - 15.5 %   Platelets 285 150 - 400 K/uL    2057: Patient has had dilaudid and 1L IVF. She reports that she is feeling is much better now.  She states that she has ibuprofen and tylenol #3. She states that that the tylenol #3 has not helped her pain.   Pain relieved with ibuprofen, and percocet.   Patient refuses pelvic exam today.   Assessment and Plan   1. SAB (spontaneous abortion)    Bleeding precautions  Return to MAU as needed RX percocet #15, 0RF Return to MAU as needed  Follow-up Information    Follow up  with [facility].   Specialty:  Obstetrics and Gynecology   Why:  As scheduled   Contact information:   [place] [phone]       Mathis Bud 06/27/2014, 8:55 PM

## 2014-06-27 NOTE — Discharge Instructions (Signed)
Miscarriage A miscarriage is the sudden loss of an unborn baby (fetus) before the 20th week of pregnancy. Most miscarriages happen in the first 3 months of pregnancy. Sometimes, it happens before a woman even knows she is pregnant. A miscarriage is also called a "spontaneous miscarriage" or "early pregnancy loss." Having a miscarriage can be an emotional experience. Talk with your caregiver about any questions you may have about miscarrying, the grieving process, and your future pregnancy plans. CAUSES   Problems with the fetal chromosomes that make it impossible for the baby to develop normally. Problems with the baby's genes or chromosomes are most often the result of errors that occur, by chance, as the embryo divides and grows. The problems are not inherited from the parents.  Infection of the cervix or uterus.   Hormone problems.   Problems with the cervix, such as having an incompetent cervix. This is when the tissue in the cervix is not strong enough to hold the pregnancy.   Problems with the uterus, such as an abnormally shaped uterus, uterine fibroids, or congenital abnormalities.   Certain medical conditions.   Smoking, drinking alcohol, or taking illegal drugs.   Trauma.  Often, the cause of a miscarriage is unknown.  SYMPTOMS   Vaginal bleeding or spotting, with or without cramps or pain.  Pain or cramping in the abdomen or lower back.  Passing fluid, tissue, or blood clots from the vagina. DIAGNOSIS  Your caregiver will perform a physical exam. You may also have an ultrasound to confirm the miscarriage. Blood or urine tests may also be ordered. TREATMENT   Sometimes, treatment is not necessary if you naturally pass all the fetal tissue that was in the uterus. If some of the fetus or placenta remains in the body (incomplete miscarriage), tissue left behind may become infected and must be removed. Usually, a dilation and curettage (D and C) procedure is performed.  During a D and C procedure, the cervix is widened (dilated) and any remaining fetal or placental tissue is gently removed from the uterus.  Antibiotic medicines are prescribed if there is an infection. Other medicines may be given to reduce the size of the uterus (contract) if there is a lot of bleeding.  If you have Rh negative blood and your baby was Rh positive, you will need a Rh immunoglobulin shot. This shot will protect any future baby from having Rh blood problems in future pregnancies. HOME CARE INSTRUCTIONS   Your caregiver may order bed rest or may allow you to continue light activity. Resume activity as directed by your caregiver.  Have someone help with home and family responsibilities during this time.   Keep track of the number of sanitary pads you use each day and how soaked (saturated) they are. Write down this information.   Do not use tampons. Do not douche or have sexual intercourse until approved by your caregiver.   Only take over-the-counter or prescription medicines for pain or discomfort as directed by your caregiver.   Do not take aspirin. Aspirin can cause bleeding.   Keep all follow-up appointments with your caregiver.   If you or your partner have problems with grieving, talk to your caregiver or seek counseling to help cope with the pregnancy loss. Allow enough time to grieve before trying to get pregnant again.  SEEK IMMEDIATE MEDICAL CARE IF:   You have severe cramps or pain in your back or abdomen.  You have a fever.  You pass large blood clots (walnut-sized   or larger) ortissue from your vagina. Save any tissue for your caregiver to inspect.   Your bleeding increases.   You have a thick, bad-smelling vaginal discharge.  You become lightheaded, weak, or you faint.   You have chills.  MAKE SURE YOU:  Understand these instructions.  Will watch your condition.  Will get help right away if you are not doing well or get  worse. Document Released: 12/04/2000 Document Revised: 10/05/2012 Document Reviewed: 07/30/2011 Exit[employer] Patient Information 2015 [employer]. This information is not intended to replace advice given to you by your health care provider. Make sure you discuss any questions you have with your health care provider.  

## 2014-06-27 NOTE — MAU Note (Signed)
Was given cytotec yesterday.  Was given ibuprofen and Tylenol #3.  Started having bad pains and heavy bleeding at 4 this afternoon.  Is bleeding through clothes. Pt moved from wc to chair in triage. Thrashing about. Trying to encourage pt to slow breathing.

## 2014-06-28 ENCOUNTER — Ambulatory Visit (HOSPITAL_COMMUNITY): Admission: RE | Admit: 2014-06-28 | Payer: Medicare Other | Source: Ambulatory Visit

## 2014-06-28 LAB — URINE CULTURE

## 2014-06-30 ENCOUNTER — Encounter: Payer: Medicare Other | Admitting: Obstetrics & Gynecology

## 2014-07-01 ENCOUNTER — Ambulatory Visit: Payer: Medicare Other | Admitting: Obstetrics & Gynecology

## 2014-08-19 ENCOUNTER — Encounter (HOSPITAL_COMMUNITY): Payer: Self-pay

## 2014-10-18 DIAGNOSIS — D631 Anemia in chronic kidney disease: Secondary | ICD-10-CM | POA: Diagnosis not present

## 2014-10-18 DIAGNOSIS — Z992 Dependence on renal dialysis: Secondary | ICD-10-CM | POA: Diagnosis not present

## 2014-10-18 DIAGNOSIS — E875 Hyperkalemia: Secondary | ICD-10-CM | POA: Diagnosis present

## 2014-10-18 DIAGNOSIS — Z87891 Personal history of nicotine dependence: Secondary | ICD-10-CM | POA: Diagnosis not present

## 2014-10-18 DIAGNOSIS — E8770 Fluid overload, unspecified: Secondary | ICD-10-CM | POA: Diagnosis present

## 2014-10-18 DIAGNOSIS — Z5181 Encounter for therapeutic drug level monitoring: Secondary | ICD-10-CM | POA: Diagnosis not present

## 2014-10-18 DIAGNOSIS — Z8719 Personal history of other diseases of the digestive system: Secondary | ICD-10-CM | POA: Diagnosis not present

## 2014-10-18 DIAGNOSIS — I10 Essential (primary) hypertension: Secondary | ICD-10-CM | POA: Diagnosis not present

## 2014-10-18 DIAGNOSIS — D72829 Elevated white blood cell count, unspecified: Secondary | ICD-10-CM | POA: Diagnosis not present

## 2014-10-18 DIAGNOSIS — N920 Excessive and frequent menstruation with regular cycle: Secondary | ICD-10-CM | POA: Diagnosis present

## 2014-10-18 DIAGNOSIS — R319 Hematuria, unspecified: Secondary | ICD-10-CM | POA: Diagnosis not present

## 2014-10-18 DIAGNOSIS — T8611 Kidney transplant rejection: Secondary | ICD-10-CM | POA: Diagnosis not present

## 2014-10-18 DIAGNOSIS — E878 Other disorders of electrolyte and fluid balance, not elsewhere classified: Secondary | ICD-10-CM | POA: Diagnosis not present

## 2014-10-18 DIAGNOSIS — R768 Other specified abnormal immunological findings in serum: Secondary | ICD-10-CM | POA: Diagnosis not present

## 2014-10-18 DIAGNOSIS — Z79899 Other long term (current) drug therapy: Secondary | ICD-10-CM | POA: Diagnosis not present

## 2014-10-18 DIAGNOSIS — Z940 Kidney transplant status: Secondary | ICD-10-CM | POA: Diagnosis not present

## 2014-10-18 DIAGNOSIS — I120 Hypertensive chronic kidney disease with stage 5 chronic kidney disease or end stage renal disease: Secondary | ICD-10-CM | POA: Diagnosis not present

## 2014-10-18 DIAGNOSIS — E871 Hypo-osmolality and hyponatremia: Secondary | ICD-10-CM | POA: Diagnosis present

## 2014-10-18 DIAGNOSIS — E872 Acidosis: Secondary | ICD-10-CM | POA: Diagnosis not present

## 2014-10-18 DIAGNOSIS — D899 Disorder involving the immune mechanism, unspecified: Secondary | ICD-10-CM | POA: Diagnosis not present

## 2014-10-18 DIAGNOSIS — N19 Unspecified kidney failure: Secondary | ICD-10-CM | POA: Diagnosis not present

## 2014-10-18 DIAGNOSIS — N179 Acute kidney failure, unspecified: Secondary | ICD-10-CM | POA: Diagnosis present

## 2014-10-18 DIAGNOSIS — B259 Cytomegaloviral disease, unspecified: Secondary | ICD-10-CM | POA: Diagnosis not present

## 2014-10-18 DIAGNOSIS — T8619 Other complication of kidney transplant: Secondary | ICD-10-CM | POA: Diagnosis not present

## 2014-10-18 DIAGNOSIS — N186 End stage renal disease: Secondary | ICD-10-CM | POA: Diagnosis present

## 2014-10-18 DIAGNOSIS — N390 Urinary tract infection, site not specified: Secondary | ICD-10-CM | POA: Diagnosis not present

## 2014-10-18 DIAGNOSIS — Z9119 Patient's noncompliance with other medical treatment and regimen: Secondary | ICD-10-CM | POA: Diagnosis present

## 2014-10-18 DIAGNOSIS — R9431 Abnormal electrocardiogram [ECG] [EKG]: Secondary | ICD-10-CM | POA: Diagnosis not present

## 2014-10-18 DIAGNOSIS — Z529 Donor of unspecified organ or tissue: Secondary | ICD-10-CM | POA: Diagnosis not present

## 2014-10-18 DIAGNOSIS — Z4822 Encounter for aftercare following kidney transplant: Secondary | ICD-10-CM | POA: Diagnosis not present

## 2014-10-18 DIAGNOSIS — K219 Gastro-esophageal reflux disease without esophagitis: Secondary | ICD-10-CM | POA: Diagnosis present

## 2014-10-18 DIAGNOSIS — Z9114 Patient's other noncompliance with medication regimen: Secondary | ICD-10-CM | POA: Diagnosis present

## 2014-10-18 DIAGNOSIS — R34 Anuria and oliguria: Secondary | ICD-10-CM | POA: Diagnosis not present

## 2014-10-31 DIAGNOSIS — N186 End stage renal disease: Secondary | ICD-10-CM | POA: Diagnosis not present

## 2014-10-31 DIAGNOSIS — D631 Anemia in chronic kidney disease: Secondary | ICD-10-CM | POA: Diagnosis not present

## 2014-10-31 DIAGNOSIS — N2581 Secondary hyperparathyroidism of renal origin: Secondary | ICD-10-CM | POA: Diagnosis not present

## 2014-11-02 DIAGNOSIS — N186 End stage renal disease: Secondary | ICD-10-CM | POA: Diagnosis not present

## 2014-11-02 DIAGNOSIS — D631 Anemia in chronic kidney disease: Secondary | ICD-10-CM | POA: Diagnosis not present

## 2014-11-02 DIAGNOSIS — N2581 Secondary hyperparathyroidism of renal origin: Secondary | ICD-10-CM | POA: Diagnosis not present

## 2014-11-04 DIAGNOSIS — N186 End stage renal disease: Secondary | ICD-10-CM | POA: Diagnosis not present

## 2014-11-04 DIAGNOSIS — N2581 Secondary hyperparathyroidism of renal origin: Secondary | ICD-10-CM | POA: Diagnosis not present

## 2014-11-04 DIAGNOSIS — D631 Anemia in chronic kidney disease: Secondary | ICD-10-CM | POA: Diagnosis not present

## 2014-11-07 DIAGNOSIS — N186 End stage renal disease: Secondary | ICD-10-CM | POA: Diagnosis not present

## 2014-11-07 DIAGNOSIS — D631 Anemia in chronic kidney disease: Secondary | ICD-10-CM | POA: Diagnosis not present

## 2014-11-07 DIAGNOSIS — N2581 Secondary hyperparathyroidism of renal origin: Secondary | ICD-10-CM | POA: Diagnosis not present

## 2014-11-09 DIAGNOSIS — N2581 Secondary hyperparathyroidism of renal origin: Secondary | ICD-10-CM | POA: Diagnosis not present

## 2014-11-09 DIAGNOSIS — N186 End stage renal disease: Secondary | ICD-10-CM | POA: Diagnosis not present

## 2014-11-09 DIAGNOSIS — D631 Anemia in chronic kidney disease: Secondary | ICD-10-CM | POA: Diagnosis not present

## 2014-11-11 DIAGNOSIS — N186 End stage renal disease: Secondary | ICD-10-CM | POA: Diagnosis not present

## 2014-11-11 DIAGNOSIS — N2581 Secondary hyperparathyroidism of renal origin: Secondary | ICD-10-CM | POA: Diagnosis not present

## 2014-11-11 DIAGNOSIS — D631 Anemia in chronic kidney disease: Secondary | ICD-10-CM | POA: Diagnosis not present

## 2014-11-14 DIAGNOSIS — N186 End stage renal disease: Secondary | ICD-10-CM | POA: Diagnosis not present

## 2014-11-14 DIAGNOSIS — D631 Anemia in chronic kidney disease: Secondary | ICD-10-CM | POA: Diagnosis not present

## 2014-11-14 DIAGNOSIS — N2581 Secondary hyperparathyroidism of renal origin: Secondary | ICD-10-CM | POA: Diagnosis not present

## 2014-11-16 DIAGNOSIS — N2581 Secondary hyperparathyroidism of renal origin: Secondary | ICD-10-CM | POA: Diagnosis not present

## 2014-11-16 DIAGNOSIS — N186 End stage renal disease: Secondary | ICD-10-CM | POA: Diagnosis not present

## 2014-11-16 DIAGNOSIS — D631 Anemia in chronic kidney disease: Secondary | ICD-10-CM | POA: Diagnosis not present

## 2014-11-18 DIAGNOSIS — D631 Anemia in chronic kidney disease: Secondary | ICD-10-CM | POA: Diagnosis not present

## 2014-11-18 DIAGNOSIS — N186 End stage renal disease: Secondary | ICD-10-CM | POA: Diagnosis not present

## 2014-11-18 DIAGNOSIS — N2581 Secondary hyperparathyroidism of renal origin: Secondary | ICD-10-CM | POA: Diagnosis not present

## 2014-11-21 DIAGNOSIS — N186 End stage renal disease: Secondary | ICD-10-CM | POA: Diagnosis not present

## 2014-11-21 DIAGNOSIS — N2581 Secondary hyperparathyroidism of renal origin: Secondary | ICD-10-CM | POA: Diagnosis not present

## 2014-11-21 DIAGNOSIS — D631 Anemia in chronic kidney disease: Secondary | ICD-10-CM | POA: Diagnosis not present

## 2014-11-22 DIAGNOSIS — Z992 Dependence on renal dialysis: Secondary | ICD-10-CM | POA: Diagnosis not present

## 2014-11-22 DIAGNOSIS — N186 End stage renal disease: Secondary | ICD-10-CM | POA: Diagnosis not present

## 2014-11-22 DIAGNOSIS — T8610 Unspecified complication of kidney transplant: Secondary | ICD-10-CM | POA: Diagnosis not present

## 2014-11-23 DIAGNOSIS — N2581 Secondary hyperparathyroidism of renal origin: Secondary | ICD-10-CM | POA: Diagnosis not present

## 2014-11-23 DIAGNOSIS — D631 Anemia in chronic kidney disease: Secondary | ICD-10-CM | POA: Diagnosis not present

## 2014-11-23 DIAGNOSIS — N186 End stage renal disease: Secondary | ICD-10-CM | POA: Diagnosis not present

## 2014-11-23 DIAGNOSIS — D509 Iron deficiency anemia, unspecified: Secondary | ICD-10-CM | POA: Diagnosis not present

## 2014-11-25 DIAGNOSIS — D509 Iron deficiency anemia, unspecified: Secondary | ICD-10-CM | POA: Diagnosis not present

## 2014-11-25 DIAGNOSIS — D631 Anemia in chronic kidney disease: Secondary | ICD-10-CM | POA: Diagnosis not present

## 2014-11-25 DIAGNOSIS — N186 End stage renal disease: Secondary | ICD-10-CM | POA: Diagnosis not present

## 2014-11-25 DIAGNOSIS — N2581 Secondary hyperparathyroidism of renal origin: Secondary | ICD-10-CM | POA: Diagnosis not present

## 2014-11-28 DIAGNOSIS — D509 Iron deficiency anemia, unspecified: Secondary | ICD-10-CM | POA: Diagnosis not present

## 2014-11-28 DIAGNOSIS — D631 Anemia in chronic kidney disease: Secondary | ICD-10-CM | POA: Diagnosis not present

## 2014-11-28 DIAGNOSIS — N2581 Secondary hyperparathyroidism of renal origin: Secondary | ICD-10-CM | POA: Diagnosis not present

## 2014-11-28 DIAGNOSIS — N186 End stage renal disease: Secondary | ICD-10-CM | POA: Diagnosis not present

## 2014-11-30 DIAGNOSIS — D509 Iron deficiency anemia, unspecified: Secondary | ICD-10-CM | POA: Diagnosis not present

## 2014-11-30 DIAGNOSIS — N186 End stage renal disease: Secondary | ICD-10-CM | POA: Diagnosis not present

## 2014-11-30 DIAGNOSIS — N2581 Secondary hyperparathyroidism of renal origin: Secondary | ICD-10-CM | POA: Diagnosis not present

## 2014-11-30 DIAGNOSIS — D631 Anemia in chronic kidney disease: Secondary | ICD-10-CM | POA: Diagnosis not present

## 2014-12-02 DIAGNOSIS — D631 Anemia in chronic kidney disease: Secondary | ICD-10-CM | POA: Diagnosis not present

## 2014-12-02 DIAGNOSIS — N186 End stage renal disease: Secondary | ICD-10-CM | POA: Diagnosis not present

## 2014-12-02 DIAGNOSIS — N2581 Secondary hyperparathyroidism of renal origin: Secondary | ICD-10-CM | POA: Diagnosis not present

## 2014-12-02 DIAGNOSIS — D509 Iron deficiency anemia, unspecified: Secondary | ICD-10-CM | POA: Diagnosis not present

## 2014-12-05 DIAGNOSIS — D631 Anemia in chronic kidney disease: Secondary | ICD-10-CM | POA: Diagnosis not present

## 2014-12-05 DIAGNOSIS — N186 End stage renal disease: Secondary | ICD-10-CM | POA: Diagnosis not present

## 2014-12-05 DIAGNOSIS — N2581 Secondary hyperparathyroidism of renal origin: Secondary | ICD-10-CM | POA: Diagnosis not present

## 2014-12-05 DIAGNOSIS — D509 Iron deficiency anemia, unspecified: Secondary | ICD-10-CM | POA: Diagnosis not present

## 2014-12-06 ENCOUNTER — Ambulatory Visit (INDEPENDENT_AMBULATORY_CARE_PROVIDER_SITE_OTHER): Payer: Medicare Other | Admitting: Obstetrics

## 2014-12-06 ENCOUNTER — Encounter: Payer: Self-pay | Admitting: Certified Nurse Midwife

## 2014-12-06 VITALS — BP 167/113 | HR 91 | Temp 97.6°F | Ht 62.0 in | Wt 152.0 lb

## 2014-12-06 DIAGNOSIS — Z30013 Encounter for initial prescription of injectable contraceptive: Secondary | ICD-10-CM

## 2014-12-06 DIAGNOSIS — Z0000 Encounter for general adult medical examination without abnormal findings: Secondary | ICD-10-CM

## 2014-12-06 MED ORDER — CONCEPT OB 130-92.4-1 MG PO CAPS: 1.0000 | ORAL_CAPSULE | Freq: Every day | ORAL | Status: DC

## 2014-12-06 MED ORDER — MEDROXYPROGESTERONE ACETATE 150 MG/ML IM SUSP: 150.0000 mg | INTRAMUSCULAR | Status: DC

## 2014-12-06 NOTE — [facility] Notes (Signed)
Subjective:    Kerry Cook is a 29 y.o. female who presents for contraception counseling. The patient has no complaints today. The patient is sexually active. Pertinent past medical history: hypertension and renal disease.  S/P kidney transplant, on Dialysis.   The information documented in the HPI was reviewed and verified.  Menstrual History: OB History    Gravida Para Term Preterm AB TAB SAB Ectopic Multiple Living   1    1  1    0       Patient's last menstrual period was 12/05/2014 (exact date).   Patient Active Problem List   Diagnosis Date Noted  . Supervision of high risk pregnancy, antepartum 06/02/2014  . ANEMIA-NOS 10/01/2007  . THROMBOCYTOPENIA 10/01/2007  . END STAGE RENAL DISEASE 10/01/2007  . PAP SMEAR, LGSIL, ABNORMAL 10/01/2007  . HYPERTENSION, BENIGN SYSTEMIC 08/21/2006  . PROTEINURIA 08/21/2006   Past Medical History  Diagnosis Date  . Hypertension   . History of kidney transplant 2012    kidney failure due to hypertension  . Chronic kidney disease     previous hx dialysis    Past Surgical History  Procedure Laterality Date  . Kidney transplant Bilateral 2012     Current outpatient prescriptions:  .  amLODipine (NORVASC) 10 MG tablet, Take 10 mg by mouth daily., Disp: , Rfl:  .  magnesium oxide (MAG-OX) 400 MG tablet, Take 400 mg by mouth daily., Disp: , Rfl:  .  tacrolimus (PROGRAF) 1 MG capsule, Take 4 mg by mouth 2 (two) times daily., Disp: , Rfl:  .  Acetaminophen-Codeine (TYLENOL/CODEINE #3) 300-30 MG per tablet, Take 1 tablet by mouth every 6 (six) hours as needed for pain. (Patient not taking: Reported on 12/06/2014), Disp: 15 tablet, Rfl: 0 .  ibuprofen (ADVIL,MOTRIN) 600 MG tablet, Take 1 tablet (600 mg total) by mouth every 8 (eight) hours as needed for cramping. (Patient not taking: Reported on 12/06/2014), Disp: 30 tablet, Rfl: 0 .  labetalol (NORMODYNE) 300 MG tablet, Take 300 mg by mouth 3 (three) times daily., Disp: , Rfl:  .   medroxyPROGESTERone (DEPO-PROVERA) 150 MG/ML injection, Inject 1 mL (150 mg total) into the muscle every 3 (three) months., Disp: 1 mL, Rfl: 3 .  misoprostol (CYTOTEC) 200 MCG tablet, Place four pills in vagina x one. (Patient not taking: Reported on 12/06/2014), Disp: 4 tablet, Rfl: 0 .  nitrofurantoin, macrocrystal-monohydrate, (MACROBID) 100 MG capsule, Take 1 capsule (100 mg total) by mouth 2 (two) times daily. (Patient not taking: Reported on 12/06/2014), Disp: 14 capsule, Rfl: 0 .  omeprazole (PRILOSEC) 20 MG capsule, Take 20 mg by mouth daily., Disp: , Rfl:  .  oxyCODONE-acetaminophen (PERCOCET/ROXICET) 5-325 MG per tablet, Take 1-2 tablets by mouth every 4 (four) hours as needed for moderate pain or severe pain. (Patient not taking: Reported on 12/06/2014), Disp: 15 tablet, Rfl: 0 .  Prenat w/o A Vit-FeFum-FePo-FA (CONCEPT OB) 130-92.4-1 MG CAPS, Take 1 capsule by mouth daily., Disp: 30 capsule, Rfl: 11 .  sodium bicarbonate 650 MG tablet, Take 650 mg by mouth 3 (three) times daily. , Disp: , Rfl:  No Known Allergies  History  Substance Use Topics  . Smoking status: Never Smoker   . Smokeless tobacco: Never Used  . Alcohol Use: No    History reviewed. No pertinent family history.     Review of Systems Constitutional: negative for weight loss Genitourinary: positive for abnormal menstrual periods, and negative for vaginal discharge   Objective:   BP 167/113 mmHg  Pulse 91  Temp(Src) 97.6 F (36.4 C)  Ht 5\' 2"  (1.575 m)  Wt 152 lb (68.947 kg)  BMI 27.79 kg/m2  LMP 12/05/2014 (Exact Date)  Breastfeeding? Unknown    PE:  Deferred  Lab Review Urine pregnancy test Labs reviewed yes Radiologic studies reviewed yes  100% of 10 min visit spent on counseling and coordination of care.   Assessment:    29 y.o., starting Depo-Provera injections, no contraindications.   Plan:   Depo Provera Rx.   All questions answered. Contraception: Depo-Provera injections. Discussed  healthy lifestyle modifications. Follow up as needed.    Meds ordered this encounter  Medications  . amLODipine (NORVASC) 10 MG tablet    Sig: Take 10 mg by mouth daily.  . medroxyPROGESTERone (DEPO-PROVERA) 150 MG/ML injection    Sig: Inject 1 mL (150 mg total) into the muscle every 3 (three) months.    Dispense:  1 mL    Refill:  3  . Prenat w/o A Vit-FeFum-FePo-FA (CONCEPT OB) 130-92.4-1 MG CAPS    Sig: Take 1 capsule by mouth daily.    Dispense:  30 capsule    Refill:  11   No orders of the defined types were placed in this encounter.

## 2014-12-07 DIAGNOSIS — D509 Iron deficiency anemia, unspecified: Secondary | ICD-10-CM | POA: Diagnosis not present

## 2014-12-07 DIAGNOSIS — N186 End stage renal disease: Secondary | ICD-10-CM | POA: Diagnosis not present

## 2014-12-07 DIAGNOSIS — N2581 Secondary hyperparathyroidism of renal origin: Secondary | ICD-10-CM | POA: Diagnosis not present

## 2014-12-07 DIAGNOSIS — D631 Anemia in chronic kidney disease: Secondary | ICD-10-CM | POA: Diagnosis not present

## 2014-12-09 DIAGNOSIS — D509 Iron deficiency anemia, unspecified: Secondary | ICD-10-CM | POA: Diagnosis not present

## 2014-12-09 DIAGNOSIS — N2581 Secondary hyperparathyroidism of renal origin: Secondary | ICD-10-CM | POA: Diagnosis not present

## 2014-12-09 DIAGNOSIS — D631 Anemia in chronic kidney disease: Secondary | ICD-10-CM | POA: Diagnosis not present

## 2014-12-09 DIAGNOSIS — N186 End stage renal disease: Secondary | ICD-10-CM | POA: Diagnosis not present

## 2014-12-12 DIAGNOSIS — D509 Iron deficiency anemia, unspecified: Secondary | ICD-10-CM | POA: Diagnosis not present

## 2014-12-12 DIAGNOSIS — N186 End stage renal disease: Secondary | ICD-10-CM | POA: Diagnosis not present

## 2014-12-12 DIAGNOSIS — N2581 Secondary hyperparathyroidism of renal origin: Secondary | ICD-10-CM | POA: Diagnosis not present

## 2014-12-12 DIAGNOSIS — D631 Anemia in chronic kidney disease: Secondary | ICD-10-CM | POA: Diagnosis not present

## 2014-12-14 DIAGNOSIS — D509 Iron deficiency anemia, unspecified: Secondary | ICD-10-CM | POA: Diagnosis not present

## 2014-12-14 DIAGNOSIS — N186 End stage renal disease: Secondary | ICD-10-CM | POA: Diagnosis not present

## 2014-12-14 DIAGNOSIS — N2581 Secondary hyperparathyroidism of renal origin: Secondary | ICD-10-CM | POA: Diagnosis not present

## 2014-12-14 DIAGNOSIS — D631 Anemia in chronic kidney disease: Secondary | ICD-10-CM | POA: Diagnosis not present

## 2014-12-16 DIAGNOSIS — N2581 Secondary hyperparathyroidism of renal origin: Secondary | ICD-10-CM | POA: Diagnosis not present

## 2014-12-16 DIAGNOSIS — N186 End stage renal disease: Secondary | ICD-10-CM | POA: Diagnosis not present

## 2014-12-16 DIAGNOSIS — D631 Anemia in chronic kidney disease: Secondary | ICD-10-CM | POA: Diagnosis not present

## 2014-12-16 DIAGNOSIS — D509 Iron deficiency anemia, unspecified: Secondary | ICD-10-CM | POA: Diagnosis not present

## 2014-12-19 DIAGNOSIS — N2581 Secondary hyperparathyroidism of renal origin: Secondary | ICD-10-CM | POA: Diagnosis not present

## 2014-12-19 DIAGNOSIS — D631 Anemia in chronic kidney disease: Secondary | ICD-10-CM | POA: Diagnosis not present

## 2014-12-19 DIAGNOSIS — D509 Iron deficiency anemia, unspecified: Secondary | ICD-10-CM | POA: Diagnosis not present

## 2014-12-19 DIAGNOSIS — N186 End stage renal disease: Secondary | ICD-10-CM | POA: Diagnosis not present

## 2014-12-21 DIAGNOSIS — D631 Anemia in chronic kidney disease: Secondary | ICD-10-CM | POA: Diagnosis not present

## 2014-12-21 DIAGNOSIS — D509 Iron deficiency anemia, unspecified: Secondary | ICD-10-CM | POA: Diagnosis not present

## 2014-12-21 DIAGNOSIS — N2581 Secondary hyperparathyroidism of renal origin: Secondary | ICD-10-CM | POA: Diagnosis not present

## 2014-12-21 DIAGNOSIS — N186 End stage renal disease: Secondary | ICD-10-CM | POA: Diagnosis not present

## 2014-12-22 DIAGNOSIS — T8610 Unspecified complication of kidney transplant: Secondary | ICD-10-CM | POA: Diagnosis not present

## 2014-12-22 DIAGNOSIS — Z992 Dependence on renal dialysis: Secondary | ICD-10-CM | POA: Diagnosis not present

## 2014-12-22 DIAGNOSIS — N186 End stage renal disease: Secondary | ICD-10-CM | POA: Diagnosis not present

## 2014-12-23 DIAGNOSIS — D509 Iron deficiency anemia, unspecified: Secondary | ICD-10-CM | POA: Diagnosis not present

## 2014-12-23 DIAGNOSIS — R509 Fever, unspecified: Secondary | ICD-10-CM | POA: Diagnosis not present

## 2014-12-23 DIAGNOSIS — D631 Anemia in chronic kidney disease: Secondary | ICD-10-CM | POA: Diagnosis not present

## 2014-12-23 DIAGNOSIS — N2581 Secondary hyperparathyroidism of renal origin: Secondary | ICD-10-CM | POA: Diagnosis not present

## 2014-12-23 DIAGNOSIS — N186 End stage renal disease: Secondary | ICD-10-CM | POA: Diagnosis not present

## 2014-12-26 DIAGNOSIS — D631 Anemia in chronic kidney disease: Secondary | ICD-10-CM | POA: Diagnosis not present

## 2014-12-26 DIAGNOSIS — N186 End stage renal disease: Secondary | ICD-10-CM | POA: Diagnosis not present

## 2014-12-26 DIAGNOSIS — R509 Fever, unspecified: Secondary | ICD-10-CM | POA: Diagnosis not present

## 2014-12-26 DIAGNOSIS — N2581 Secondary hyperparathyroidism of renal origin: Secondary | ICD-10-CM | POA: Diagnosis not present

## 2014-12-26 DIAGNOSIS — D509 Iron deficiency anemia, unspecified: Secondary | ICD-10-CM | POA: Diagnosis not present

## 2014-12-28 DIAGNOSIS — D509 Iron deficiency anemia, unspecified: Secondary | ICD-10-CM | POA: Diagnosis not present

## 2014-12-28 DIAGNOSIS — R509 Fever, unspecified: Secondary | ICD-10-CM | POA: Diagnosis not present

## 2014-12-28 DIAGNOSIS — D631 Anemia in chronic kidney disease: Secondary | ICD-10-CM | POA: Diagnosis not present

## 2014-12-28 DIAGNOSIS — N2581 Secondary hyperparathyroidism of renal origin: Secondary | ICD-10-CM | POA: Diagnosis not present

## 2014-12-28 DIAGNOSIS — N186 End stage renal disease: Secondary | ICD-10-CM | POA: Diagnosis not present

## 2014-12-30 DIAGNOSIS — D509 Iron deficiency anemia, unspecified: Secondary | ICD-10-CM | POA: Diagnosis not present

## 2014-12-30 DIAGNOSIS — D631 Anemia in chronic kidney disease: Secondary | ICD-10-CM | POA: Diagnosis not present

## 2014-12-30 DIAGNOSIS — N2581 Secondary hyperparathyroidism of renal origin: Secondary | ICD-10-CM | POA: Diagnosis not present

## 2014-12-30 DIAGNOSIS — N186 End stage renal disease: Secondary | ICD-10-CM | POA: Diagnosis not present

## 2014-12-30 DIAGNOSIS — R509 Fever, unspecified: Secondary | ICD-10-CM | POA: Diagnosis not present

## 2015-01-02 DIAGNOSIS — N186 End stage renal disease: Secondary | ICD-10-CM | POA: Diagnosis not present

## 2015-01-02 DIAGNOSIS — D631 Anemia in chronic kidney disease: Secondary | ICD-10-CM | POA: Diagnosis not present

## 2015-01-02 DIAGNOSIS — R509 Fever, unspecified: Secondary | ICD-10-CM | POA: Diagnosis not present

## 2015-01-02 DIAGNOSIS — D509 Iron deficiency anemia, unspecified: Secondary | ICD-10-CM | POA: Diagnosis not present

## 2015-01-02 DIAGNOSIS — N2581 Secondary hyperparathyroidism of renal origin: Secondary | ICD-10-CM | POA: Diagnosis not present

## 2015-01-04 DIAGNOSIS — N2581 Secondary hyperparathyroidism of renal origin: Secondary | ICD-10-CM | POA: Diagnosis not present

## 2015-01-04 DIAGNOSIS — R509 Fever, unspecified: Secondary | ICD-10-CM | POA: Diagnosis not present

## 2015-01-04 DIAGNOSIS — N186 End stage renal disease: Secondary | ICD-10-CM | POA: Diagnosis not present

## 2015-01-04 DIAGNOSIS — D509 Iron deficiency anemia, unspecified: Secondary | ICD-10-CM | POA: Diagnosis not present

## 2015-01-04 DIAGNOSIS — D631 Anemia in chronic kidney disease: Secondary | ICD-10-CM | POA: Diagnosis not present

## 2015-01-06 DIAGNOSIS — R509 Fever, unspecified: Secondary | ICD-10-CM | POA: Diagnosis not present

## 2015-01-06 DIAGNOSIS — D631 Anemia in chronic kidney disease: Secondary | ICD-10-CM | POA: Diagnosis not present

## 2015-01-06 DIAGNOSIS — D509 Iron deficiency anemia, unspecified: Secondary | ICD-10-CM | POA: Diagnosis not present

## 2015-01-06 DIAGNOSIS — N2581 Secondary hyperparathyroidism of renal origin: Secondary | ICD-10-CM | POA: Diagnosis not present

## 2015-01-06 DIAGNOSIS — N186 End stage renal disease: Secondary | ICD-10-CM | POA: Diagnosis not present

## 2015-01-09 DIAGNOSIS — R509 Fever, unspecified: Secondary | ICD-10-CM | POA: Diagnosis not present

## 2015-01-09 DIAGNOSIS — N186 End stage renal disease: Secondary | ICD-10-CM | POA: Diagnosis not present

## 2015-01-09 DIAGNOSIS — D509 Iron deficiency anemia, unspecified: Secondary | ICD-10-CM | POA: Diagnosis not present

## 2015-01-09 DIAGNOSIS — D631 Anemia in chronic kidney disease: Secondary | ICD-10-CM | POA: Diagnosis not present

## 2015-01-09 DIAGNOSIS — N2581 Secondary hyperparathyroidism of renal origin: Secondary | ICD-10-CM | POA: Diagnosis not present

## 2015-01-11 DIAGNOSIS — N2581 Secondary hyperparathyroidism of renal origin: Secondary | ICD-10-CM | POA: Diagnosis not present

## 2015-01-11 DIAGNOSIS — R509 Fever, unspecified: Secondary | ICD-10-CM | POA: Diagnosis not present

## 2015-01-11 DIAGNOSIS — N186 End stage renal disease: Secondary | ICD-10-CM | POA: Diagnosis not present

## 2015-01-11 DIAGNOSIS — D631 Anemia in chronic kidney disease: Secondary | ICD-10-CM | POA: Diagnosis not present

## 2015-01-11 DIAGNOSIS — D509 Iron deficiency anemia, unspecified: Secondary | ICD-10-CM | POA: Diagnosis not present

## 2015-01-12 DIAGNOSIS — Z5181 Encounter for therapeutic drug level monitoring: Secondary | ICD-10-CM | POA: Diagnosis not present

## 2015-01-12 DIAGNOSIS — E876 Hypokalemia: Secondary | ICD-10-CM | POA: Diagnosis not present

## 2015-01-12 DIAGNOSIS — N189 Chronic kidney disease, unspecified: Secondary | ICD-10-CM | POA: Diagnosis not present

## 2015-01-12 DIAGNOSIS — D631 Anemia in chronic kidney disease: Secondary | ICD-10-CM | POA: Diagnosis not present

## 2015-01-12 DIAGNOSIS — I10 Essential (primary) hypertension: Secondary | ICD-10-CM | POA: Diagnosis not present

## 2015-01-12 DIAGNOSIS — T8389XD Other specified complication of genitourinary prosthetic devices, implants and grafts, subsequent encounter: Secondary | ICD-10-CM | POA: Diagnosis not present

## 2015-01-12 DIAGNOSIS — T8619 Other complication of kidney transplant: Secondary | ICD-10-CM | POA: Diagnosis not present

## 2015-01-12 DIAGNOSIS — Z940 Kidney transplant status: Secondary | ICD-10-CM | POA: Diagnosis not present

## 2015-01-12 DIAGNOSIS — Z7952 Long term (current) use of systemic steroids: Secondary | ICD-10-CM | POA: Diagnosis not present

## 2015-01-12 DIAGNOSIS — T8611 Kidney transplant rejection: Secondary | ICD-10-CM | POA: Diagnosis not present

## 2015-01-12 DIAGNOSIS — Z79899 Other long term (current) drug therapy: Secondary | ICD-10-CM | POA: Diagnosis not present

## 2015-01-12 DIAGNOSIS — D899 Disorder involving the immune mechanism, unspecified: Secondary | ICD-10-CM | POA: Diagnosis not present

## 2015-01-12 DIAGNOSIS — Z4822 Encounter for aftercare following kidney transplant: Secondary | ICD-10-CM | POA: Diagnosis not present

## 2015-01-12 DIAGNOSIS — Z7982 Long term (current) use of aspirin: Secondary | ICD-10-CM | POA: Diagnosis not present

## 2015-01-13 DIAGNOSIS — D631 Anemia in chronic kidney disease: Secondary | ICD-10-CM | POA: Diagnosis not present

## 2015-01-13 DIAGNOSIS — D509 Iron deficiency anemia, unspecified: Secondary | ICD-10-CM | POA: Diagnosis not present

## 2015-01-13 DIAGNOSIS — N2581 Secondary hyperparathyroidism of renal origin: Secondary | ICD-10-CM | POA: Diagnosis not present

## 2015-01-13 DIAGNOSIS — N186 End stage renal disease: Secondary | ICD-10-CM | POA: Diagnosis not present

## 2015-01-13 DIAGNOSIS — R509 Fever, unspecified: Secondary | ICD-10-CM | POA: Diagnosis not present

## 2015-01-16 DIAGNOSIS — N186 End stage renal disease: Secondary | ICD-10-CM | POA: Diagnosis not present

## 2015-01-16 DIAGNOSIS — N2581 Secondary hyperparathyroidism of renal origin: Secondary | ICD-10-CM | POA: Diagnosis not present

## 2015-01-16 DIAGNOSIS — D509 Iron deficiency anemia, unspecified: Secondary | ICD-10-CM | POA: Diagnosis not present

## 2015-01-16 DIAGNOSIS — D631 Anemia in chronic kidney disease: Secondary | ICD-10-CM | POA: Diagnosis not present

## 2015-01-16 DIAGNOSIS — R509 Fever, unspecified: Secondary | ICD-10-CM | POA: Diagnosis not present

## 2015-01-18 DIAGNOSIS — R509 Fever, unspecified: Secondary | ICD-10-CM | POA: Diagnosis not present

## 2015-01-18 DIAGNOSIS — D631 Anemia in chronic kidney disease: Secondary | ICD-10-CM | POA: Diagnosis not present

## 2015-01-18 DIAGNOSIS — D509 Iron deficiency anemia, unspecified: Secondary | ICD-10-CM | POA: Diagnosis not present

## 2015-01-18 DIAGNOSIS — N186 End stage renal disease: Secondary | ICD-10-CM | POA: Diagnosis not present

## 2015-01-18 DIAGNOSIS — N2581 Secondary hyperparathyroidism of renal origin: Secondary | ICD-10-CM | POA: Diagnosis not present

## 2015-01-20 ENCOUNTER — Observation Stay (HOSPITAL_COMMUNITY)
Admission: EM | Admit: 2015-01-20 | Discharge: 2015-01-23 | Disposition: A | Discharge status: Home / Self Care | Payer: Medicare Other | Attending: Oncology | Admitting: Oncology

## 2015-01-20 ENCOUNTER — Encounter (HOSPITAL_COMMUNITY): Payer: Self-pay | Admitting: Emergency Medicine

## 2015-01-20 DIAGNOSIS — I120 Hypertensive chronic kidney disease with stage 5 chronic kidney disease or end stage renal disease: Secondary | ICD-10-CM | POA: Insufficient documentation

## 2015-01-20 DIAGNOSIS — Z7952 Long term (current) use of systemic steroids: Secondary | ICD-10-CM | POA: Diagnosis not present

## 2015-01-20 DIAGNOSIS — D631 Anemia in chronic kidney disease: Secondary | ICD-10-CM | POA: Diagnosis not present

## 2015-01-20 DIAGNOSIS — Z8679 Personal history of other diseases of the circulatory system: Secondary | ICD-10-CM

## 2015-01-20 DIAGNOSIS — R7989 Other specified abnormal findings of blood chemistry: Secondary | ICD-10-CM | POA: Diagnosis present

## 2015-01-20 DIAGNOSIS — Z862 Personal history of diseases of the blood and blood-forming organs and certain disorders involving the immune mechanism: Secondary | ICD-10-CM | POA: Insufficient documentation

## 2015-01-20 DIAGNOSIS — N186 End stage renal disease: Secondary | ICD-10-CM | POA: Diagnosis not present

## 2015-01-20 DIAGNOSIS — R509 Fever, unspecified: Secondary | ICD-10-CM | POA: Insufficient documentation

## 2015-01-20 DIAGNOSIS — Z79899 Other long term (current) drug therapy: Secondary | ICD-10-CM | POA: Diagnosis not present

## 2015-01-20 DIAGNOSIS — R231 Pallor: Secondary | ICD-10-CM | POA: Insufficient documentation

## 2015-01-20 DIAGNOSIS — Z940 Kidney transplant status: Secondary | ICD-10-CM | POA: Diagnosis not present

## 2015-01-20 DIAGNOSIS — D649 Anemia, unspecified: Secondary | ICD-10-CM

## 2015-01-20 DIAGNOSIS — R000 Tachycardia, unspecified: Secondary | ICD-10-CM | POA: Diagnosis not present

## 2015-01-20 DIAGNOSIS — D509 Iron deficiency anemia, unspecified: Secondary | ICD-10-CM | POA: Diagnosis not present

## 2015-01-20 DIAGNOSIS — R42 Dizziness and giddiness: Secondary | ICD-10-CM | POA: Diagnosis not present

## 2015-01-20 DIAGNOSIS — Z992 Dependence on renal dialysis: Secondary | ICD-10-CM | POA: Insufficient documentation

## 2015-01-20 DIAGNOSIS — R0602 Shortness of breath: Secondary | ICD-10-CM | POA: Diagnosis not present

## 2015-01-20 DIAGNOSIS — N2581 Secondary hyperparathyroidism of renal origin: Secondary | ICD-10-CM | POA: Diagnosis not present

## 2015-01-20 DIAGNOSIS — N189 Chronic kidney disease, unspecified: Secondary | ICD-10-CM

## 2015-01-20 DIAGNOSIS — J189 Pneumonia, unspecified organism: Secondary | ICD-10-CM | POA: Diagnosis present

## 2015-01-20 HISTORY — DX: Dependence on renal dialysis: Z99.2

## 2015-01-20 LAB — COMPREHENSIVE METABOLIC PANEL
ALT: 7 U/L — AB (ref 14–54)
ANION GAP: 12 (ref 5–15)
AST: 15 U/L (ref 15–41)
Albumin: 2.7 g/dL — ABNORMAL LOW (ref 3.5–5.0)
Alkaline Phosphatase: 76 U/L (ref 38–126)
CO2: 34 mmol/L — ABNORMAL HIGH (ref 22–32)
Calcium: 9.1 mg/dL (ref 8.9–10.3)
Chloride: 94 mmol/L — ABNORMAL LOW (ref 101–111)
Creatinine, Ser: 3.57 mg/dL — ABNORMAL HIGH (ref 0.44–1.00)
GFR calc Af Amer: 19 mL/min — ABNORMAL LOW (ref >60)
GFR, EST NON AFRICAN AMERICAN: 16 mL/min — AB (ref >60)
Glucose, Bld: 68 mg/dL (ref 65–99)
Potassium: 2.9 mmol/L — ABNORMAL LOW (ref 3.5–5.1)
SODIUM: 140 mmol/L (ref 135–145)
Total Bilirubin: 0.6 mg/dL (ref 0.3–1.2)
Total Protein: 8.6 g/dL — ABNORMAL HIGH (ref 6.5–8.1)

## 2015-01-20 LAB — CBC WITH DIFFERENTIAL/PLATELET
BASOS ABS: 0 10*3/uL (ref 0.0–0.1)
BASOS PCT: 0 % (ref 0–1)
Eosinophils Absolute: 0.8 10*3/uL — ABNORMAL HIGH (ref 0.0–0.7)
Eosinophils Relative: 6 % — ABNORMAL HIGH (ref 0–5)
HEMATOCRIT: 25.5 % — AB (ref 36.0–46.0)
HEMOGLOBIN: 7.6 g/dL — AB (ref 12.0–15.0)
Lymphocytes Relative: 13 % (ref 12–46)
Lymphs Abs: 1.8 10*3/uL (ref 0.7–4.0)
MCH: 24 pg — AB (ref 26.0–34.0)
MCHC: 29.8 g/dL — ABNORMAL LOW (ref 30.0–36.0)
MCV: 80.4 fL (ref 78.0–100.0)
MONO ABS: 0.8 10*3/uL (ref 0.1–1.0)
Monocytes Relative: 6 % (ref 3–12)
NEUTROS ABS: 10.4 10*3/uL — AB (ref 1.7–7.7)
NEUTROS PCT: 75 % (ref 43–77)
PLATELETS: 276 10*3/uL (ref 150–400)
RBC: 3.17 MIL/uL — AB (ref 3.87–5.11)
RDW: 22.4 % — ABNORMAL HIGH (ref 11.5–15.5)
WBC: 13.9 10*3/uL — AB (ref 4.0–10.5)

## 2015-01-20 LAB — FOLATE: FOLATE: 6.3 ng/mL (ref >5.9)

## 2015-01-20 LAB — RETICULOCYTES
RBC.: 2.76 MIL/uL — AB (ref 3.87–5.11)
RETIC COUNT ABSOLUTE: 58 10*3/uL (ref 19.0–186.0)
Retic Ct Pct: 2.1 % (ref 0.4–3.1)

## 2015-01-20 LAB — PREPARE RBC (CROSSMATCH)

## 2015-01-20 LAB — POC OCCULT BLOOD, ED: FECAL OCCULT BLD: NEGATIVE

## 2015-01-20 MED ORDER — HEPARIN SODIUM (PORCINE) 5000 UNIT/ML IJ SOLN
5000.0000 [IU] | Freq: Three times a day (TID) | INTRAMUSCULAR | Status: DC
  Filled 2015-01-20: qty 1

## 2015-01-20 MED ORDER — SODIUM CHLORIDE 0.9 % IV SOLN: Freq: Once | INTRAVENOUS | Status: DC

## 2015-01-20 MED ORDER — PANTOPRAZOLE SODIUM 40 MG PO TBEC
40.0000 mg | DELAYED_RELEASE_TABLET | Freq: Every day | ORAL | Status: DC
  Administered 2015-01-21 – 2015-01-23 (×3): 40 mg via ORAL
  Filled 2015-01-20 (×5): qty 1

## 2015-01-20 MED ORDER — MYCOPHENOLATE SODIUM 180 MG PO TBEC
720.0000 mg | DELAYED_RELEASE_TABLET | Freq: Two times a day (BID) | ORAL | Status: DC
  Administered 2015-01-20 – 2015-01-23 (×6): 720 mg via ORAL
  Filled 2015-01-20 (×8): qty 4

## 2015-01-20 MED ORDER — ACETAMINOPHEN 650 MG RE SUPP: 650.0000 mg | Freq: Four times a day (QID) | RECTAL | Status: DC | PRN

## 2015-01-20 MED ORDER — LABETALOL HCL 300 MG PO TABS
300.0000 mg | ORAL_TABLET | Freq: Three times a day (TID) | ORAL | Status: DC
  Administered 2015-01-20 – 2015-01-23 (×8): 300 mg via ORAL
  Filled 2015-01-20 (×9): qty 1

## 2015-01-20 MED ORDER — AMLODIPINE BESYLATE 10 MG PO TABS: 10.0000 mg | ORAL_TABLET | Freq: Every day | ORAL | Status: DC

## 2015-01-20 MED ORDER — PANTOPRAZOLE SODIUM 40 MG PO TBEC: 40.0000 mg | DELAYED_RELEASE_TABLET | Freq: Every day | ORAL | Status: DC

## 2015-01-20 MED ORDER — AMLODIPINE BESYLATE 10 MG PO TABS
10.0000 mg | ORAL_TABLET | Freq: Every day | ORAL | Status: DC
  Administered 2015-01-21 – 2015-01-23 (×3): 10 mg via ORAL
  Filled 2015-01-20 (×3): qty 1

## 2015-01-20 MED ORDER — ACETAMINOPHEN 325 MG PO TABS
650.0000 mg | ORAL_TABLET | Freq: Four times a day (QID) | ORAL | Status: DC | PRN
  Administered 2015-01-21: 650 mg via ORAL
  Filled 2015-01-20: qty 2

## 2015-01-20 MED ORDER — TACROLIMUS 1 MG PO CAPS
3.0000 mg | ORAL_CAPSULE | Freq: Two times a day (BID) | ORAL | Status: DC
  Administered 2015-01-20 – 2015-01-23 (×6): 3 mg via ORAL
  Filled 2015-01-20 (×6): qty 3

## 2015-01-20 MED ORDER — AMLODIPINE BESYLATE 5 MG PO TABS
10.0000 mg | ORAL_TABLET | Freq: Once | ORAL | Status: AC
  Administered 2015-01-20: 10 mg via ORAL
  Filled 2015-01-20: qty 2

## 2015-01-20 MED ORDER — PREDNISONE 5 MG PO TABS
5.0000 mg | ORAL_TABLET | Freq: Every day | ORAL | Status: DC
  Administered 2015-01-20 – 2015-01-23 (×3): 5 mg via ORAL
  Filled 2015-01-20 (×4): qty 1

## 2015-01-20 NOTE — ED Notes (Signed)
Pt stated she has high BP, she has not taken her BP meds today because she just got out of dialysis.

## 2015-01-20 NOTE — Progress Notes (Signed)
Called Ed for report. Patient has been running high BP and tachycardic. ED nurse will call MD. Awaiting call back from ED nurse.   Ermalinda Memos, RN

## 2015-01-20 NOTE — H&P (Signed)
Date: 01/20/2015               Patient Name:  Kerry Cook MRN: [id number]  DOB: 08-03-1985 Age / Sex: 29 y.o., female   PCP: No Pcp Per Patient         Medical Service: Internal Medicine Teaching Service         Attending Physician: Dr. Sid Falcon, MD    First Contact: Dr. Randell Patient Pager: [phone]  Second Contact: Dr. 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  She undergoes MWF dialysis.  She went to dialysis today, where she was told her hemoglobin was low and that she needed to go to the ED for transfusion.    She endorses SOB when lying flat, that usually resolves following a dialysis session.  She denies SOB when exercising (can walk 2 flights of stairs, or 1 mile).  She also endorses subjective fever and chills for the last week (since Friday 7/22).  She denies cough, sick contacts, runny nose, sinus congestion, CP, dizziness, N/V, C/D, headache, vision changes, dysuria, hematochezia, or melena.  Her last menstrual period was 2 weeks ago.  Her periods are regular, lasting 7 days, starting heavy and lightening.  She denies prolonged bleeding with her cycles.  She is s/p bilateral renal transplant that failed in may 2016.  The transplants are still in place.  She remains on her antirejection medications (Tacrolimus, Mycophenylate, and Prednisone).  Her nephrologist and transplant surgeon are at [facility]: Current Facility-Administered Medications  Medication Dose Route Frequency Provider Last Rate Last Dose  . 0.9 %  sodium chloride infusion   Intravenous Once Theodosia Quay, MD       Current Outpatient Prescriptions  Medication Sig Dispense Refill  . Acetaminophen-Codeine (TYLENOL/CODEINE #3) 300-30 MG per  tablet Take 1 tablet by mouth every 6 (six) hours as needed for pain. (Patient not taking: Reported on 12/06/2014) 15 tablet 0  . amLODipine (NORVASC) 10 MG tablet Take 10 mg by mouth daily.    Marland Kitchen ibuprofen (ADVIL,MOTRIN) 600 MG tablet Take 1 tablet (600 mg total) by mouth every 8 (eight) hours as needed for cramping. (Patient not taking: Reported on 12/06/2014) 30 tablet 0  . labetalol (NORMODYNE) 300 MG tablet Take 300 mg by mouth 3 (three) times daily.    . magnesium oxide (MAG-OX) 400 MG tablet Take 400 mg by mouth daily.    . medroxyPROGESTERone (DEPO-PROVERA) 150 MG/ML injection Inject 1 mL (150 mg total) into the muscle every 3 (three) months. 1 mL 3  . misoprostol (CYTOTEC) 200 MCG tablet Place four pills in vagina x one. (Patient not taking: Reported on 12/06/2014) 4 tablet 0  . nitrofurantoin, macrocrystal-monohydrate, (MACROBID) 100 MG capsule Take 1 capsule (100 mg total) by mouth 2 (two) times daily. (Patient not taking: Reported on 12/06/2014) 14 capsule 0  . omeprazole (PRILOSEC) 20 MG capsule Take 20 mg by mouth daily.    Marland Kitchen oxyCODONE-acetaminophen (PERCOCET/ROXICET) 5-325 MG per tablet Take 1-2 tablets by mouth every 4 (four) hours as needed for moderate pain or severe pain. (Patient not taking: Reported on 12/06/2014) 15 tablet 0  . Prenat w/o A Vit-FeFum-FePo-FA (CONCEPT OB) 130-92.4-1 MG CAPS Take 1 capsule  by mouth daily. 30 capsule 11  . sodium bicarbonate 650 MG tablet Take 650 mg by mouth 3 (three) times daily.     . tacrolimus (PROGRAF) 1 MG capsule Take 4 mg by mouth 2 (two) times daily.      Allergies: Allergies as of 01/20/2015  . (No Known Allergies)   Past Medical History  Diagnosis Date  . Hypertension   . History of kidney transplant 2012    kidney failure due to hypertension  . Chronic kidney disease     previous hx dialysis  . Dialysis patient    Past Surgical History  Procedure Laterality Date  . Kidney transplant Bilateral 2012   No family history on  file. History   Social History  . Marital Status: Single    Spouse Name: N/A  . Number of Children: N/A  . Years of Education: N/A   Occupational History  . Not on file.   Social History Main Topics  . Smoking status: Never Smoker   . Smokeless tobacco: Never Used  . Alcohol Use: No  . Drug Use: No  . Sexual Activity:    Partners: Male    Birth Control/ Protection: Condom, None   Other Topics Concern  . Not on file   Social History Narrative    Review of Systems: Pertinent items are noted in HPI.  Physical Exam: Blood pressure 195/119, pulse 118, temperature 99.2 F (37.3 C), temperature source Oral, resp. rate 22, weight 146 lb (66.225 kg), last menstrual period 01/06/2015, SpO2 99 %, unknown if currently breastfeeding. Physical Exam  Constitutional: She is oriented to person, place, and time and well-developed, well-nourished, and in no distress. No distress.  HENT:  Head: Normocephalic and atraumatic.  Eyes: EOM are normal.  Neck: Normal range of motion. No tracheal deviation present.  Cardiovascular: Regular rhythm, normal heart sounds and intact distal pulses.   Tachycardic.   Pulmonary/Chest: Effort normal and breath sounds normal. No respiratory distress. She has no wheezes.  Abdominal: Soft. There is no tenderness. There is no rebound and no guarding.  Musculoskeletal: She exhibits no edema.  Neurological: She is alert and oriented to person, place, and time.  Skin: Skin is warm and dry. No erythema.     Lab results: Basic Metabolic Panel:  Recent Labs  01/20/15 1755  NA 140  K 2.9*  CL 94*  CO2 34*  GLUCOSE 68  BUN <5*  CREATININE 3.57*  CALCIUM 9.1   Liver Function Tests:  Recent Labs  01/20/15 1755  AST 15  ALT 7*  ALKPHOS 76  BILITOT 0.6  PROT 8.6*  ALBUMIN 2.7*   No results for input(s): LIPASE, AMYLASE in the last 72 hours. No results for input(s): AMMONIA in the last 72 hours. CBC:  Recent Labs  01/20/15 1755  WBC 13.9*   NEUTROABS 10.4*  HGB 7.6*  HCT 25.5*  MCV 80.4  PLT 276   Cardiac Enzymes: No results for input(s): CKTOTAL, CKMB, CKMBINDEX, TROPONINI in the last 72 hours. BNP: No results for input(s): PROBNP in the last 72 hours. D-Dimer: No results for input(s): DDIMER in the last 72 hours. CBG: No results for input(s): GLUCAP in the last 72 hours. Hemoglobin A1C: No results for input(s): HGBA1C in the last 72 hours. Fasting Lipid Panel: No results for input(s): CHOL, HDL, LDLCALC, TRIG, CHOLHDL, LDLDIRECT in the last 72 hours. Thyroid Function Tests: No results for input(s): TSH, T4TOTAL, FREET4, T3FREE, THYROIDAB in the last 72 hours. Anemia Panel: No results  for input(s): VITAMINB12, FOLATE, FERRITIN, TIBC, IRON, RETICCTPCT in the last 72 hours. Coagulation: No results for input(s): LABPROT, INR in the last 72 hours. Urine Drug Screen: Drugs of Abuse     Component Value Date/Time   LABOPIA NEG 06/02/2014 1103   COCAINSCRNUR NEG 06/02/2014 1103   LABBENZ NEG 06/02/2014 1103   LABBENZ NEGATIVE 08/26/2007 0820   AMPHETMU NEG 06/02/2014 1103   AMPHETMU NEGATIVE 08/26/2007 0820   THCU NEG 06/02/2014 1103   LABBARB NEG 06/02/2014 1103    Alcohol Level: No results for input(s): ETH in the last 72 hours. Urinalysis: No results for input(s): COLORURINE, LABSPEC, PHURINE, GLUCOSEU, HGBUR, BILIRUBINUR, KETONESUR, PROTEINUR, UROBILINOGEN, NITRITE, LEUKOCYTESUR in the last 72 hours.  Invalid input(s): APPERANCEUR   Imaging results:  No results found.   Assessment & Plan by Problem: Active Problems:   Anemia   End stage renal disease  Kerry Cook is a 29 yo female with obesity and ESRD 2/2 HTN s/p failed bilateral kidney transplant (2012), presenting with anemia.  Anemia: Hgb 7.6 (10.7 in Jan 2016; 13.4 in Nov 2015).  FOBT negative in ED.  Etiology unclear at this time. Likely 2/2 ESRD and failed renal transplants.  Will acquire anemia panel to investigate other causes. - Anemia  panel - Transfuse 2 units pRBCs, per ED  Fever/Chills: Patient complains of subjective fever/chills for 1 week. WBC elevated.  Leukocytosis may be 2/2 chronic prednisone. However, given patient's chronic immunosuppression, occult infection is possible.  Patient denies symptoms that point to specific etiology.  Will obtain CXR and UA and continue to monitor. - CXR - UA  ESRD s/p transplant: MWF dialysis. Last session today. Continue antirejection medications.  - Tacrolimus 3mg  BID - Mycophenylate 720 mg BID - Prednisone 5 mg  HTN: Home meds are Amlodipine 10 and Labetolol 300 TID. - Labetolol 300 mg TID - Amlodipine 10 mg   GERD: Protonix  FEN/GI: - Renal  DVT Ppx: SCDs  Dispo: Disposition is deferred at this time, awaiting improvement of current medical problems. Anticipated discharge in approximately 1-2 day(s).   The patient does not have a current PCP (No Pcp Per Patient) and does need an [facility] hospital follow-up appointment after discharge.  The patient does not have transportation limitations that hinder transportation to clinic appointments.  Signed: Iline Oven, MD, PhD 01/20/2015, 8:31 PM

## 2015-01-20 NOTE — ED Notes (Signed)
Pt st's while at dialysis today she was told her Hgb was low and she needed to come to ED for transfusion

## 2015-01-20 NOTE — Progress Notes (Signed)
Patient arrived on unit via stretcher with nurse tech. Patient alert and oriented x4. Patient oriented to unit, room and staff. Patient IV clean, dry,and intact. Skin assessment completed with Melton Alar, RN. No skin issues noted. Patient denies pain. Safety Fall Prevention Plan was given, discussed and signed by patient. Patient has been orientated to the room, unit and the staff. Orders have been reviewed and implemented. Call light has been placed within reach. RN will continue to monitor the patient.  Nena Polio BSN, RN  Phone Number: [phone]

## 2015-01-20 NOTE — ED Provider Notes (Signed)
CSN: [id number]     Arrival date & time 01/20/15  1651 History   First MD Initiated Contact with Patient 01/20/15 1814     Chief Complaint  Patient presents with  . Abnormal Lab     (Consider location/radiation/quality/duration/timing/severity/associated sxs/prior Treatment) The history is provided by the patient and medical records. No language interpreter was used.  Patient is a 29 year old female PMH ESRD (2/2 HTN) who presents from dialysis with anemia likely requiring anemia. Pt has list of hemoglobins checked weekly at dialysis. On 7/20 her Hgb was 8.0 and today it is 6.7. Pt does relates she has had fatigue that seems to be worsening over last 1-2 weeks. Also she feels that she is increasingly SOB with exertion compared to her normal baseline. She states she has had intermittent fevers at home over last 2 weeks. Notably today she is afebrile in triage. She denies any chest pain, nausea, vomiting, diarrhea. States she still makes urine approximately one time daily. She is a kidney transplant pt. She states she has been compliant with her rejection medications. She relates she is currently on transplant list. States has had to be transfused in past but thinks this was around 2009, unsure of why she required any transfusion at that point. Boyfriend is also present in exam room and he relates that he thinks pt appears more pale than usual. Pt states she was slightly below her dry weight today at dialysis which is 66.8 kg.   Past Medical History  Diagnosis Date  . Hypertension   . History of kidney transplant 2012    kidney failure due to hypertension  . Chronic kidney disease     previous hx dialysis  . Dialysis patient    Past Surgical History  Procedure Laterality Date  . Kidney transplant Bilateral 2012   No family history on file. History  Substance Use Topics  . Smoking status: Never Smoker   . Smokeless tobacco: Never Used  . Alcohol Use: No   OB History    Gravida Para  Term Preterm AB TAB SAB Ectopic Multiple Living   1    1  1    0     Review of Systems  Constitutional: Positive for fever (at home, none at triage) and fatigue (increasing over last 2 weeks). Negative for chills.  HENT: Negative for congestion and rhinorrhea.   Eyes: Negative for photophobia and visual disturbance.  Respiratory: Positive for shortness of breath (as described in HPI). Negative for cough, chest tightness and wheezing.   Cardiovascular: Negative for chest pain and leg swelling.  Gastrointestinal: Negative for nausea, vomiting and blood in stool (denies bloody BM or dark tarry stools).  Endocrine: Negative for polyphagia and polyuria.  Genitourinary: Negative for dysuria and decreased urine volume.  Musculoskeletal: Negative for arthralgias and gait problem.  Skin: Positive for pallor (per significant other). Negative for rash.  Neurological: Positive for light-headedness. Negative for syncope.  Psychiatric/Behavioral: Negative for confusion and agitation.  All other systems reviewed and are negative.     Allergies  Review of patient's allergies indicates no known allergies.  Home Medications   Prior to Admission medications   Medication Sig Start Date End Date Taking? Authorizing Provider  amLODipine (NORVASC) 10 MG tablet Take 10 mg by mouth daily.   Yes Historical Provider, MD  ibuprofen (ADVIL,MOTRIN) 200 MG tablet Take 200 mg by mouth every 6 (six) hours as needed.   Yes Historical Provider, MD  labetalol (NORMODYNE) 300 MG tablet Take  300 mg by mouth 3 (three) times daily.   Yes Historical Provider, MD  medroxyPROGESTERone (DEPO-PROVERA) 150 MG/ML injection Inject 1 mL (150 mg total) into the muscle every 3 (three) months. 12/06/14  Yes Shelly Bombard, MD  mycophenolate (MYFORTIC) 360 MG TBEC EC tablet Take 720 mg by mouth 2 (two) times daily.   Yes Historical Provider, MD  predniSONE (DELTASONE) 5 MG tablet Take 5 mg by mouth daily with breakfast.   Yes  Historical Provider, MD  Prenat w/o A Vit-FeFum-FePo-FA (CONCEPT OB) 130-92.4-1 MG CAPS Take 1 capsule by mouth daily. 12/06/14  Yes Shelly Bombard, MD  tacrolimus (PROGRAF) 1 MG capsule Take 3 mg by mouth 2 (two) times daily.    Yes Historical Provider, MD   BP 133/87 mmHg  Pulse 95  Temp(Src) 97.7 F (36.5 C) (Oral)  Resp 18  Ht 5\' 1"  (1.549 m)  Wt 145 lb 11.6 oz (66.1 kg)  BMI 27.55 kg/m2  SpO2 98%  LMP 01/06/2015 Physical Exam  Constitutional: She is oriented to person, place, and time. She appears well-developed and well-nourished. No distress.  HENT:  Head: Normocephalic and atraumatic.  Eyes: Conjunctivae and EOM are normal.  Pale sclera  Neck: Normal range of motion. Neck supple.  Cardiovascular: Regular rhythm.  Tachycardia present.   No murmur heard. Pulmonary/Chest: Effort normal and breath sounds normal. No respiratory distress.  Abdominal: Soft. There is no tenderness.  Musculoskeletal: Normal range of motion. She exhibits no tenderness.  Neurological: She is alert and oriented to person, place, and time.  Skin: Skin is warm and dry. She is not diaphoretic. There is pallor.  Psychiatric: She has a normal mood and affect. Her behavior is normal.  Nursing note and vitals reviewed.   ED Course  Procedures (including critical care time) Labs Review Labs Reviewed  CBC WITH DIFFERENTIAL/PLATELET - Abnormal; Notable for the following:    WBC 13.9 (*)    RBC 3.17 (*)    Hemoglobin 7.6 (*)    HCT 25.5 (*)    MCH 24.0 (*)    MCHC 29.8 (*)    RDW 22.4 (*)    Neutro Abs 10.4 (*)    Eosinophils Relative 6 (*)    Eosinophils Absolute 0.8 (*)    All other components within normal limits  COMPREHENSIVE METABOLIC PANEL - Abnormal; Notable for the following:    Potassium 2.9 (*)    Chloride 94 (*)    CO2 34 (*)    BUN <5 (*)    Creatinine, Ser 3.57 (*)    Total Protein 8.6 (*)    Albumin 2.7 (*)    ALT 7 (*)    GFR calc non Af Amer 16 (*)    GFR calc Af Amer 19  (*)    All other components within normal limits  IRON AND TIBC - Abnormal; Notable for the following:    Iron 16 (*)    TIBC 136 (*)    All other components within normal limits  FERRITIN - Abnormal; Notable for the following:    Ferritin 2161 (*)    All other components within normal limits  RETICULOCYTES - Abnormal; Notable for the following:    RBC. 2.76 (*)    All other components within normal limits  URINALYSIS, ROUTINE W REFLEX MICROSCOPIC (NOT AT [facility]) - Abnormal; Notable for the following:    APPearance HAZY (*)    pH 8.5 (*)    Glucose, UA 100 (*)    Hgb urine  dipstick SMALL (*)    Ketones, ur 15 (*)    Protein, ur >300 (*)    All other components within normal limits  CBC - Abnormal; Notable for the following:    WBC 14.6 (*)    RBC 3.39 (*)    Hemoglobin 8.3 (*)    HCT 27.4 (*)    MCH 24.5 (*)    RDW 20.5 (*)    All other components within normal limits  RENAL FUNCTION PANEL - Abnormal; Notable for the following:    Sodium 133 (*)    Potassium 3.2 (*)    Chloride 92 (*)    Creatinine, Ser 5.21 (*)    Calcium 8.7 (*)    Albumin 2.3 (*)    GFR calc non Af Amer 10 (*)    GFR calc Af Amer 12 (*)    All other components within normal limits  URINE MICROSCOPIC-ADD ON - Abnormal; Notable for the following:    Squamous Epithelial / LPF MANY (*)    Bacteria, UA MANY (*)    All other components within normal limits  VITAMIN B12  FOLATE  CBC  SAVE SMEAR  POCT GASTRIC OCCULT BLOOD (1-CARD TO LAB)  POC OCCULT BLOOD, ED  TYPE AND SCREEN  PREPARE RBC (CROSSMATCH)    Imaging Review Dg Chest 2 View  01/21/2015   CLINICAL DATA:  29 year old female with a history of fever. Nonsmoker.  EXAM: CHEST - 2 VIEW  COMPARISON:  03/16/2014  FINDINGS: Cardiomediastinal silhouette projects within normal limits in size and contour. Ill-defined interstitial/ airspace disease at the right base. Ill-defined opacity in the retrocardiac region. No pneumothorax or pleural effusion.  .   No displaced fracture.  Unremarkable appearance of the upper abdomen.  IMPRESSION: Interstitial/airspace disease of the bilateral bases, new from the comparison concerning for multifocal infection/bronchitis given the history.  Signed,  Dulcy Fanny. Earleen Newport, DO  [facility]  [facility]   Electronically Signed   By: Corrie Mckusick D.O.   On: 01/21/2015 09:02     EKG Interpretation None      MDM   Final diagnoses:  Tachycardia  Anemia, unspecified anemia type  End stage renal disease  History of hypertension    Patient is a 29 year old female PMH ESRD (2/2 HTN) who presents from dialysis with anemia likely requiring anemia. Patient is pale and tachycardic on exam today. CBC, BMP and type and screen ordered. Hgb is 7.6 here. Given fact pt is symptomatic, discussed with medicine who will admit the patient for transfusion and further workup.  Patient is notably hemoccult negative. No history of bloody bowel movements. Low Hgb likely a component of ESRD due to decreased erythropoietin. Could also be due to anemia of chronic disease given normal MCV. Acute blood loss seems less likely.   Plan discussed with pt and she is agreeable. Consent obtained for blood transfusion and blood ordered. Pt stable at time of transfer to the floor.     Theodosia Quay, MD 01/21/15 1507  Noemi Chapel, MD 01/22/15 [phone]

## 2015-01-20 NOTE — ED Provider Notes (Signed)
The patient is a 29 year old female, she has a history of end-stage renal disease on dialysis since May when her kidney transplant failed. She presents with some generalized weakness but was told that she was anemic at dialysis today and referred to the emergency department for blood transfusion. The patient has pale nail beds, pale conjunctiva, mucous membranes are pale as well. She is tachycardic to 120, her abdomen is obese but nontender, lungs are clear, she is in no distress. She has no peripheral edema. Labs show a significant drop in her hemoglobin. Hemoccult-negative, please see resident note.  The patient's home blood pressure medications were given.  The patient will need to be admitted for blood transfusion. I discussed this with the patient and she is in agreement with the plan.  I saw and evaluated the patient, reviewed the resident's note and I agree with the findings and plan.   Final diagnoses:  Tachycardia  Anemia, unspecified anemia type  End stage renal disease  History of hypertension      Noemi Chapel, MD 01/22/15 1356

## 2015-01-21 ENCOUNTER — Observation Stay (HOSPITAL_COMMUNITY): Payer: Medicare Other

## 2015-01-21 DIAGNOSIS — R000 Tachycardia, unspecified: Secondary | ICD-10-CM | POA: Diagnosis not present

## 2015-01-21 DIAGNOSIS — R509 Fever, unspecified: Secondary | ICD-10-CM

## 2015-01-21 DIAGNOSIS — N186 End stage renal disease: Secondary | ICD-10-CM | POA: Diagnosis not present

## 2015-01-21 DIAGNOSIS — D649 Anemia, unspecified: Secondary | ICD-10-CM | POA: Diagnosis not present

## 2015-01-21 DIAGNOSIS — R0602 Shortness of breath: Secondary | ICD-10-CM | POA: Diagnosis not present

## 2015-01-21 DIAGNOSIS — T8612 Kidney transplant failure: Secondary | ICD-10-CM

## 2015-01-21 DIAGNOSIS — I129 Hypertensive chronic kidney disease with stage 1 through stage 4 chronic kidney disease, or unspecified chronic kidney disease: Secondary | ICD-10-CM

## 2015-01-21 DIAGNOSIS — K219 Gastro-esophageal reflux disease without esophagitis: Secondary | ICD-10-CM

## 2015-01-21 LAB — CBC
HCT: 27.4 % — ABNORMAL LOW (ref 36.0–46.0)
HCT: 27.4 % — ABNORMAL LOW (ref 36.0–46.0)
HEMOGLOBIN: 8.7 g/dL — AB (ref 12.0–15.0)
Hemoglobin: 8.3 g/dL — ABNORMAL LOW (ref 12.0–15.0)
MCH: 24.5 pg — ABNORMAL LOW (ref 26.0–34.0)
MCH: 25.2 pg — ABNORMAL LOW (ref 26.0–34.0)
MCHC: 30.3 g/dL (ref 30.0–36.0)
MCHC: 31.8 g/dL (ref 30.0–36.0)
MCV: 80.8 fL (ref 78.0–100.0)
MCV: 79.4 fL (ref 78.0–100.0)
Platelets: 247 10*3/uL (ref 150–400)
Platelets: 263 10*3/uL (ref 150–400)
RBC: 3.39 MIL/uL — ABNORMAL LOW (ref 3.87–5.11)
RBC: 3.45 MIL/uL — AB (ref 3.87–5.11)
RDW: 20.5 % — AB (ref 11.5–15.5)
RDW: 20.4 % — ABNORMAL HIGH (ref 11.5–15.5)
WBC: 14.6 10*3/uL — ABNORMAL HIGH (ref 4.0–10.5)
WBC: 13 10*3/uL — AB (ref 4.0–10.5)

## 2015-01-21 LAB — RENAL FUNCTION PANEL
Albumin: 2.3 g/dL — ABNORMAL LOW (ref 3.5–5.0)
Anion gap: 13 (ref 5–15)
BUN: 9 mg/dL (ref 6–20)
CO2: 28 mmol/L (ref 22–32)
Calcium: 8.7 mg/dL — ABNORMAL LOW (ref 8.9–10.3)
Chloride: 92 mmol/L — ABNORMAL LOW (ref 101–111)
Creatinine, Ser: 5.21 mg/dL — ABNORMAL HIGH (ref 0.44–1.00)
GFR, EST AFRICAN AMERICAN: 12 mL/min — AB (ref >60)
GFR, EST NON AFRICAN AMERICAN: 10 mL/min — AB (ref >60)
Glucose, Bld: 96 mg/dL (ref 65–99)
Phosphorus: 4.3 mg/dL (ref 2.5–4.6)
Potassium: 3.2 mmol/L — ABNORMAL LOW (ref 3.5–5.1)
Sodium: 133 mmol/L — ABNORMAL LOW (ref 135–145)

## 2015-01-21 LAB — URINALYSIS, ROUTINE W REFLEX MICROSCOPIC
Bilirubin Urine: NEGATIVE
Glucose, UA: 100 mg/dL — AB
Ketones, ur: 15 mg/dL — AB
Leukocytes, UA: NEGATIVE
Nitrite: NEGATIVE
Protein, ur: >300 mg/dL — AB
Specific Gravity, Urine: 1.009 (ref 1.005–1.030)
Urobilinogen, UA: 0.2 mg/dL (ref 0.0–1.0)
pH: 8.5 — ABNORMAL HIGH (ref 5.0–8.0)

## 2015-01-21 LAB — URINE MICROSCOPIC-ADD ON

## 2015-01-21 LAB — FERRITIN: Ferritin: 2161 ng/mL — ABNORMAL HIGH (ref 11–307)

## 2015-01-21 LAB — VITAMIN B12: Vitamin B-12: 603 pg/mL (ref 180–914)

## 2015-01-21 LAB — IRON AND TIBC
Iron: 16 ug/dL — ABNORMAL LOW (ref 28–170)
Saturation Ratios: 12 % (ref 10.4–31.8)
TIBC: 136 ug/dL — AB (ref 250–450)
UIBC: 120 ug/dL

## 2015-01-21 LAB — SAVE SMEAR

## 2015-01-21 MED ORDER — AZITHROMYCIN 500 MG PO TABS
500.0000 mg | ORAL_TABLET | Freq: Every day | ORAL | Status: AC
  Administered 2015-01-21: 500 mg via ORAL
  Filled 2015-01-21: qty 1

## 2015-01-21 MED ORDER — LEVOFLOXACIN 750 MG PO TABS: 750.0000 mg | ORAL_TABLET | Freq: Once | ORAL | Status: DC

## 2015-01-21 MED ORDER — LEVOFLOXACIN 500 MG PO TABS: 500.0000 mg | ORAL_TABLET | ORAL | Status: DC

## 2015-01-21 MED ORDER — CEFTRIAXONE SODIUM IN DEXTROSE 20 MG/ML IV SOLN
1.0000 g | INTRAVENOUS | Status: DC
  Administered 2015-01-21: 1 g via INTRAVENOUS
  Filled 2015-01-21 (×2): qty 50

## 2015-01-21 MED ORDER — POTASSIUM CHLORIDE CRYS ER 20 MEQ PO TBCR
20.0000 meq | EXTENDED_RELEASE_TABLET | Freq: Once | ORAL | Status: AC
  Administered 2015-01-21: 20 meq via ORAL
  Filled 2015-01-21: qty 1

## 2015-01-21 MED ORDER — AZITHROMYCIN 500 MG PO TABS
250.0000 mg | ORAL_TABLET | Freq: Every day | ORAL | Status: DC
  Administered 2015-01-22: 250 mg via ORAL
  Filled 2015-01-21: qty 1

## 2015-01-21 NOTE — Progress Notes (Addendum)
ANTIBIOTIC CONSULT NOTE - INITIAL  Pharmacy Consult for levaquin PO Indication: CAP  No Known Allergies  Patient Measurements: Height: 5\' 1"  (154.9 cm) Weight: 145 lb 11.6 oz (66.1 kg) IBW/kg (Calculated) : 47.8 Adjusted Body Weight:   Vital Signs: Temp: 97.7 F (36.5 C) (07/30 1032) Temp Source: Oral (07/30 1032) BP: 133/87 mmHg (07/30 1032) Pulse Rate: 95 (07/30 1032) Intake/Output from previous day: 07/29 0701 - 07/30 0700 In: 1084 [P.O.:462; Blood:622] Out: -  Intake/Output from this shift:    Labs:  Recent Labs  01/20/15 1755 01/21/15 0610  WBC 13.9* 14.6*  HGB 7.6* 8.3*  PLT 276 247  CREATININE 3.57* 5.21*   Estimated Creatinine Clearance: 14 mL/min (by C-G formula based on Cr of 5.21). No results for input(s): VANCOTROUGH, VANCOPEAK, VANCORANDOM, GENTTROUGH, GENTPEAK, GENTRANDOM, TOBRATROUGH, TOBRAPEAK, TOBRARND, AMIKACINPEAK, AMIKACINTROU, AMIKACIN in the last 72 hours.   Microbiology: No results found for this or any previous visit (from the past 720 hour(s)).  Medical History: Past Medical History  Diagnosis Date  . Hypertension   . History of kidney transplant 2012    kidney failure due to hypertension  . Chronic kidney disease     previous hx dialysis  . Dialysis patient     Medications:  Anti-infectives    Start     Dose/Rate Route Frequency Ordered Stop   01/23/15 1000  levofloxacin (LEVAQUIN) tablet 500 mg     500 mg Oral Every 48 hours 01/21/15 1340     01/21/15 1400  levofloxacin (LEVAQUIN) tablet 750 mg     750 mg Oral  Once 01/21/15 1340       Assessment: Kerry Cook admitted with anemia to start levaquin for treatment of CAP. Pt with Tmax of 100 and WBC 14.6. Pt is ESRD on HD.  Goal of Therapy:  Eradication of infection  Plan:  - Levaquin 750mg  PO x 1 today then 500mg  PO Q48H - F/u renal fxn, C&S, clinical status  *Pharmacy will sign-off as no further dose adjustments are anticipated. Thank you for the consult! Please reconsult  if needed.   Salome Arnt, PharmD, BCPS Pager # [phone] 01/21/2015 1:43 PM  Addendum: Changing levaquin to ceftriaxone + azithromycin. QTc will still require monitoring with azithromycin.   Plan: - Ceftriaxone 1gm IV Q24H  Salome Arnt, PharmD, BCPS Pager # [phone] 01/21/2015 2:57 PM

## 2015-01-21 NOTE — Progress Notes (Signed)
Pt complained of chest pain and heart racing. Placed pt on 2L 02 for comfort and telemetry. EKG completed per MD order. MD notified of current status. Awaiting new orders. Will continue to monitor.   O2 sat 100% on Room Air  Heart Rate 104    Penni Bombard, RN 01/21/2015 4:38 PM

## 2015-01-21 NOTE — Progress Notes (Addendum)
Subjective: No acute events overnight. Doing well this morning. She reports she feels better overall. Denies dizziness or LH with sitting up. No CP or SOB. Denies N/V, abdominal pain.  Objective: Vital signs in last 24 hours: Filed Vitals:   01/21/15 0231 01/21/15 0403 01/21/15 0416 01/21/15 0458  BP: 151/94  158/98 146/102  Pulse: 102  102 103  Temp: 98.4 F (36.9 C)  98.2 F (36.8 C) 98 F (36.7 C)  TempSrc: Oral  Oral Oral  Resp: 20  20 18   Height:      Weight:  145 lb 11.6 oz (66.1 kg)    SpO2: 100%  100% 100%   Weight change:   Intake/Output Summary (Last 24 hours) at 01/21/15 [id number] Last data filed at 01/21/15 0600  Gross per 24 hour  Intake   1084 ml  Output      0 ml  Net   1084 ml   General Apperance: NAD HEENT: Normocephalic, atraumatic, PERRL, EOMI, anicteric sclera Neck: Supple, trachea midline Lungs: Clear to auscultation bilaterally. No wheezes, rhonchi or rales. Breathing comfortably Heart: Tachycardic rate and regular rhythm, no murmur/rub/gallop Abdomen: Soft, nontender, nondistended, no rebound/guarding Extremities: Normal, atraumatic, warm and well perfused, no edema Pulses: 2+ throughout Skin: No rashes or lesions Neurologic: Alert and oriented x 3. CNII-XII intact. Normal strength and sensation  Lab Results: Basic Metabolic Panel:  Recent Labs Lab 01/20/15 1755 01/21/15 0610  NA 140 133*  K 2.9* 3.2*  CL 94* 92*  CO2 34* 28  GLUCOSE 68 96  BUN <5* 9  CREATININE 3.57* 5.21*  CALCIUM 9.1 8.7*  PHOS  --  4.3   Liver Function Tests:  Recent Labs Lab 01/20/15 1755 01/21/15 0610  AST 15  --   ALT 7*  --   ALKPHOS 76  --   BILITOT 0.6  --   PROT 8.6*  --   ALBUMIN 2.7* 2.3*   CBC:  Recent Labs Lab 01/20/15 1755 01/21/15 0610  WBC 13.9* 14.6*  NEUTROABS 10.4*  --   HGB 7.6* 8.3*  HCT 25.5* 27.4*  MCV 80.4 80.8  PLT 276 247   Anemia Panel:  Recent Labs Lab 01/20/15 2202  VITAMINB12 603  FOLATE 6.3  FERRITIN 2161*    TIBC 136*  IRON 16*  RETICCTPCT 2.1   Urinalysis:    Component Value Date/Time   COLORURINE YELLOW 06/26/2014 2035   APPEARANCEUR CLOUDY* 06/26/2014 2035   LABSPEC 1.025 06/26/2014 2035   PHURINE 7.0 06/26/2014 2035   GLUCOSEU NEGATIVE 06/26/2014 2035   HGBUR LARGE* 06/26/2014 2035   BILIRUBINUR SMALL* 06/26/2014 2035   KETONESUR 15* 06/26/2014 2035   PROTEINUR 100* 06/26/2014 2035   UROBILINOGEN 0.2 06/26/2014 2035   NITRITE NEGATIVE 06/26/2014 2035   LEUKOCYTESUR MODERATE* 06/26/2014 2035   Urine Drug Screen: Drugs of Abuse     Component Value Date/Time   LABOPIA NEG 06/02/2014 1103   COCAINSCRNUR NEG 06/02/2014 1103   LABBENZ NEG 06/02/2014 1103   LABBENZ NEGATIVE 08/26/2007 0820   AMPHETMU NEG 06/02/2014 1103   AMPHETMU NEGATIVE 08/26/2007 0820   THCU NEG 06/02/2014 1103   LABBARB NEG 06/02/2014 1103     Medications: I have reviewed the patient's current medications. Scheduled Meds: . sodium chloride   Intravenous Once  . amLODipine  10 mg Oral Daily  . labetalol  300 mg Oral TID  . mycophenolate  720 mg Oral BID  . pantoprazole  40 mg Oral Daily  . predniSONE  5 mg Oral  Q breakfast  . 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  Normocytic Anemia: Hgb 7.6 on admission. MCV 80.4. Last hgb 10.7 in Jan 2016. FOBT negative in ED. Type and crossed. Received 2u pRBC in ED. Hgb this morning 8.3. Vitamin B12 and Folate within normal limits. Iron and TIBC low at 16 and 136, respectively. Ferritin high at 2161. Reticulocytes unremarkable. Consistent with anemia of chronic disease -Recheck CBC at 6PM -Obtain smear -Transfuse prn for hgb goal >7 -Continue to monitor  CAP: Patient complains of subjective fever/chills for 1 week. WBC elevated to 13.9 on  admission.Previously 18.3 on 06/27/2014. Leukocytosis may be 2/2 chronic prednisone. CXR with interstitial/airspace disease of the bilateral bases. Might be considered HCAP given she is a dialysis patient. -PO Levaquin per pharmacy for 5 day course of antibiotics (end after January 25, 2015) -EKG to monitor QTc  Addendum: QTc 52ms on EKG. Will change Levaquin to IV ceftriaxone and azithro. Nonspecific T wave changes but no previous EKG for comparison. Denies chest pain. Consider repeat EKG in AM.  ESRD s/p transplant: MWF dialysis. Last session 01/20/2015. Continue antirejection medications.  - Tacrolimus 3mg  BID - Mycophenylate 720 mg BID - Prednisone 5 mg daily  HTN: Continue home amlodipine 10mg  daily and labetolol 300mg  TID.  GERD: Protonix 40mg  daily  FEN/GI: - Renal  DVT Ppx: SCDs  Dispo: Disposition is deferred at this time, awaiting improvement of current medical problems.  Anticipated discharge in approximately 1 day(s).   The patient does not have a current PCP (No Pcp Per Patient) and does not need an [facility] hospital follow-up appointment after discharge.  The patient does not know have transportation limitations that hinder transportation to clinic appointments.  .Services Needed at time of discharge: Y = Yes, Blank = No PT:   OT:   RN:   Equipment:   Other:     LOS: 1 day   Kerry Loll, MD 01/21/2015, 7:12 AM

## 2015-01-22 DIAGNOSIS — Z992 Dependence on renal dialysis: Secondary | ICD-10-CM | POA: Diagnosis not present

## 2015-01-22 DIAGNOSIS — T8610 Unspecified complication of kidney transplant: Secondary | ICD-10-CM | POA: Diagnosis not present

## 2015-01-22 DIAGNOSIS — R000 Tachycardia, unspecified: Secondary | ICD-10-CM | POA: Diagnosis not present

## 2015-01-22 DIAGNOSIS — N186 End stage renal disease: Secondary | ICD-10-CM | POA: Diagnosis not present

## 2015-01-22 DIAGNOSIS — R0602 Shortness of breath: Secondary | ICD-10-CM | POA: Diagnosis not present

## 2015-01-22 DIAGNOSIS — R509 Fever, unspecified: Secondary | ICD-10-CM | POA: Diagnosis not present

## 2015-01-22 LAB — CBC
HEMATOCRIT: 29.1 % — AB (ref 36.0–46.0)
Hemoglobin: 9.2 g/dL — ABNORMAL LOW (ref 12.0–15.0)
MCH: 25.1 pg — ABNORMAL LOW (ref 26.0–34.0)
MCHC: 31.6 g/dL (ref 30.0–36.0)
MCV: 79.5 fL (ref 78.0–100.0)
Platelets: 285 10*3/uL (ref 150–400)
RBC: 3.66 MIL/uL — AB (ref 3.87–5.11)
RDW: 20.7 % — ABNORMAL HIGH (ref 11.5–15.5)
WBC: 11.9 10*3/uL — AB (ref 4.0–10.5)

## 2015-01-22 LAB — BASIC METABOLIC PANEL
Anion gap: 19 — ABNORMAL HIGH (ref 5–15)
BUN: 17 mg/dL (ref 6–20)
CALCIUM: 9 mg/dL (ref 8.9–10.3)
CO2: 26 mmol/L (ref 22–32)
Chloride: 91 mmol/L — ABNORMAL LOW (ref 101–111)
Creatinine, Ser: 7.44 mg/dL — ABNORMAL HIGH (ref 0.44–1.00)
GFR calc Af Amer: 8 mL/min — ABNORMAL LOW (ref >60)
GFR, EST NON AFRICAN AMERICAN: 7 mL/min — AB (ref >60)
GLUCOSE: 81 mg/dL (ref 65–99)
Potassium: 3.3 mmol/L — ABNORMAL LOW (ref 3.5–5.1)
Sodium: 136 mmol/L (ref 135–145)

## 2015-01-22 LAB — TYPE AND SCREEN
ABO/RH(D): O POS
Antibody Screen: POSITIVE
DAT, IgG: NEGATIVE
DONOR AG TYPE: NEGATIVE
Donor AG Type: NEGATIVE
PT AG Type: NEGATIVE
UNIT DIVISION: 0
Unit division: 0

## 2015-01-22 LAB — MAGNESIUM: Magnesium: 2.1 mg/dL (ref 1.7–2.4)

## 2015-01-22 LAB — PHOSPHORUS: Phosphorus: 5.5 mg/dL — ABNORMAL HIGH (ref 2.5–4.6)

## 2015-01-22 MED ORDER — DOXYCYCLINE HYCLATE 100 MG PO TABS
100.0000 mg | ORAL_TABLET | Freq: Two times a day (BID) | ORAL | Status: DC
  Administered 2015-01-22 – 2015-01-23 (×3): 100 mg via ORAL
  Filled 2015-01-22 (×4): qty 1

## 2015-01-22 MED ORDER — PROMETHAZINE HCL 25 MG/ML IJ SOLN
12.5000 mg | Freq: Once | INTRAMUSCULAR | Status: AC
  Administered 2015-01-22: 12.5 mg via INTRAVENOUS
  Filled 2015-01-22: qty 1

## 2015-01-22 MED ORDER — AMOXICILLIN-POT CLAVULANATE 500-125 MG PO TABS
1.0000 | ORAL_TABLET | Freq: Every day | ORAL | Status: DC
  Administered 2015-01-22: 500 mg via ORAL
  Filled 2015-01-22: qty 1

## 2015-01-22 NOTE — Progress Notes (Signed)
Subjective: No events overnight. Remains afebrile. No current complaints  Objective: Vital signs in last 24 hours: Filed Vitals:   01/21/15 1032 01/21/15 1958 01/22/15 0443 01/22/15 1107  BP: 133/87 123/82 135/86 142/97  Pulse: 95 92 97 97  Temp: 97.7 F (36.5 C) 97.8 F (36.6 C) 97.9 F (36.6 C) 98 F (36.7 C)  TempSrc: Oral Oral Oral Oral  Resp: 18 16 18 17   Height:      Weight:  146 lb 4.4 oz (66.351 kg)    SpO2: 98% 98% 100% 98%   Weight change: 4.4 oz (0.126 kg)  Intake/Output Summary (Last 24 hours) at 01/22/15 1234 Last data filed at 01/21/15 1800  Gross per 24 hour  Intake     30 ml  Output      0 ml  Net     30 ml   General: resting in bed Cardiac: RRR, no rubs, murmurs or gallops Pulm: clear to auscultation bilaterally Abd: soft, nontender, nondistended, BS present Ext: warm and well perfused, no pedal edema, AVF in right forarm with bruit Neuro: alert and oriented X3, cranial nerves II-XII grossly intact   Lab Results: Basic Metabolic Panel:  Recent Labs Lab 01/21/15 0610 01/22/15 0620  NA 133* 136  K 3.2* 3.3*  CL 92* 91*  CO2 28 26  GLUCOSE 96 81  BUN 9 17  CREATININE 5.21* 7.44*  CALCIUM 8.7* 9.0  MG  --  2.1  PHOS 4.3 5.5*   Liver Function Tests:  Recent Labs Lab 01/20/15 1755 01/21/15 0610  AST 15  --   ALT 7*  --   ALKPHOS 76  --   BILITOT 0.6  --   PROT 8.6*  --   ALBUMIN 2.7* 2.3*   CBC:  Recent Labs Lab 01/20/15 1755  01/21/15 1800 01/22/15 0620  WBC 13.9*  < > 13.0* 11.9*  NEUTROABS 10.4*  --   --   --   HGB 7.6*  < > 8.7* 9.2*  HCT 25.5*  < > 27.4* 29.1*  MCV 80.4  < > 79.4 79.5  PLT 276  < > 263 285  < > = values in this interval not displayed. Anemia Panel:  Recent Labs Lab 01/20/15 2202  VITAMINB12 603  FOLATE 6.3  FERRITIN 2161*  TIBC 136*  IRON 16*  RETICCTPCT 2.1   Urinalysis:    Component Value Date/Time   COLORURINE YELLOW 01/21/2015 1343   APPEARANCEUR HAZY* 01/21/2015 1343   LABSPEC  1.009 01/21/2015 1343   PHURINE 8.5* 01/21/2015 1343   GLUCOSEU 100* 01/21/2015 1343   HGBUR SMALL* 01/21/2015 1343   BILIRUBINUR NEGATIVE 01/21/2015 1343   KETONESUR 15* 01/21/2015 1343   PROTEINUR >300* 01/21/2015 1343   UROBILINOGEN 0.2 01/21/2015 1343   NITRITE NEGATIVE 01/21/2015 1343   LEUKOCYTESUR NEGATIVE 01/21/2015 1343   Urine Drug Screen: Drugs of Abuse     Component Value Date/Time   LABOPIA NEG 06/02/2014 1103   COCAINSCRNUR NEG 06/02/2014 1103   LABBENZ NEG 06/02/2014 1103   LABBENZ NEGATIVE 08/26/2007 0820   AMPHETMU NEG 06/02/2014 1103   AMPHETMU NEGATIVE 08/26/2007 0820   THCU NEG 06/02/2014 1103   LABBARB NEG 06/02/2014 1103     Medications: I have reviewed the patient's current medications. Scheduled Meds: . sodium chloride   Intravenous Once  . amLODipine  10 mg Oral Daily  . amoxicillin-clavulanate  1 tablet Oral Daily  . doxycycline  100 mg Oral Q12H  . labetalol  300 mg Oral TID  .  mycophenolate  720 mg Oral BID  . pantoprazole  40 mg Oral Daily  . predniSONE  5 mg Oral Q breakfast  . 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  Normocytic Anemia: Initial concern was that she had an acute drop in Hgb but further review of [facility] recorrds shows she has more recently been in the 7-8 range and thus her admission Hgb is less concerning. - Workup suggests Anemia of Chronic disease, will likely need Epo injections at dialysis - Is s/p 2u PRBC, Hgb is up to 9.2mg /dL  CAP:  - Patient reports some improvement with empiric cap coverage, and leukocytosis is improving.  I am concerned about her Prolonged OTc in which case both Levaquin AND Azithromycin can worsen this.   - Will change her to Augmentin and Doxycycline for empiric CAP coverage. - May be discharged home  today if can maintain O2 sat with ambulation.  Will treat for 5 day course.   ESRD s/p transplant: MWF dialysis. Last session 01/20/2015. Continue antirejection medications.  - Tacrolimus 3mg  BID - Mycophenylate 720 mg BID - Prednisone 5 mg daily  HTN: Continue home amlodipine 10mg  daily and labetolol 300mg  TID.  GERD: Protonix 40mg  daily  FEN/GI: - Renal  DVT Ppx: SCDs  Dispo: Likely discharge today, will ambulate off O2 first.  The patient does not have a current PCP (No Pcp Per Patient) and does not need an [facility] hospital follow-up appointment after discharge.  The patient does not know have transportation limitations that hinder transportation to clinic appointments.  .Services Needed at time of discharge: Y = Yes, Blank = No PT:   OT:   RN:   Equipment:   Other:     LOS: 2 days   Lucious Groves, DO 01/22/2015, 12:34 PM

## 2015-01-23 DIAGNOSIS — N186 End stage renal disease: Secondary | ICD-10-CM

## 2015-01-23 DIAGNOSIS — D631 Anemia in chronic kidney disease: Secondary | ICD-10-CM

## 2015-01-23 DIAGNOSIS — J189 Pneumonia, unspecified organism: Secondary | ICD-10-CM | POA: Diagnosis present

## 2015-01-23 DIAGNOSIS — Z992 Dependence on renal dialysis: Secondary | ICD-10-CM

## 2015-01-23 DIAGNOSIS — N2581 Secondary hyperparathyroidism of renal origin: Secondary | ICD-10-CM | POA: Diagnosis not present

## 2015-01-23 DIAGNOSIS — D509 Iron deficiency anemia, unspecified: Secondary | ICD-10-CM | POA: Diagnosis not present

## 2015-01-23 HISTORY — DX: Pneumonia, unspecified organism: J18.9

## 2015-01-23 MED ORDER — AMOXICILLIN-POT CLAVULANATE 500-125 MG PO TABS: 1.0000 | ORAL_TABLET | Freq: Every day | ORAL | Status: AC

## 2015-01-23 MED ORDER — DOXYCYCLINE HYCLATE 100 MG PO TABS: 100.0000 mg | ORAL_TABLET | Freq: Two times a day (BID) | ORAL | Status: AC

## 2015-01-23 NOTE — Progress Notes (Signed)
Pt discharge instructions given, pt verbalized understanding, VSS. Denies pain at this time.  Pt left floor ambulating accompanied by boyfriend.

## 2015-01-23 NOTE — Discharge Summary (Signed)
Name: Kerry Cook MRN: [id number] DOB: [birth year] 29 y.o. PCP: No Pcp Per Patient  Date of Admission: 01/20/2015  5:50 PM Date of Discharge: 01/23/2015 Attending Physician: Dr. Beryle Beams Discharge Diagnosis: Principal Problem:   Anemia in chronic kidney disease Active Problems:   HYPERTENSION, BENIGN SYSTEMIC   End stage renal disease   Tachycardia   Pneumonia  Discharge Medications:   Medication List    TAKE these medications        amLODipine 10 MG tablet  Commonly known as:  NORVASC  Take 10 mg by mouth daily.     amoxicillin-clavulanate 500-125 MG per tablet  Commonly known as:  AUGMENTIN  Take 1 tablet (500 mg total) by mouth daily. After dialysis, on dialysis days.     CONCEPT OB 130-92.4-1 MG Caps  Take 1 capsule by mouth daily.     doxycycline 100 MG tablet  Commonly known as:  VIBRA-TABS  Take 1 tablet (100 mg total) by mouth every 12 (twelve) hours.     ibuprofen 200 MG tablet  Commonly known as:  ADVIL,MOTRIN  Take 200 mg by mouth every 6 (six) hours as needed.     labetalol 300 MG tablet  Commonly known as:  NORMODYNE  Take 300 mg by mouth 3 (three) times daily.     medroxyPROGESTERone 150 MG/ML injection  Commonly known as:  DEPO-PROVERA  Inject 1 mL (150 mg total) into the muscle every 3 (three) months.     mycophenolate 360 MG Tbec EC tablet  Commonly known as:  MYFORTIC  Take 720 mg by mouth 2 (two) times daily.     predniSONE 5 MG tablet  Commonly known as:  DELTASONE  Take 5 mg by mouth daily with breakfast.     tacrolimus 1 MG capsule  Commonly known as:  PROGRAF  Take 3 mg by mouth 2 (two) times daily.        Disposition and follow-up:   [name] was discharged from [facility] in Stable condition.  At the hospital follow up visit please address:  1.  Anemia: Patient with a chronic anemia in ESRD that is not responsive to Mircera (low reticulocyte). She will need this followed and may consider  Parvovirus testing.   2. Pneumonia: She was found to have concern for multifocal pneumonia. On admission her symptoms were consistent with an atypical infection, we continued her treatment of 5 days course of oral Augmentin and Doxycycline to cover CAP pathogens.  3.  Labs / imaging needed at time of follow-up: Consider Parvovirus testing.  4.  Pending labs/ test needing follow-up:  None  Follow-up Appointments: Follow-up Information    Follow up with MATTINGLY,MICHAEL T, MD. Schedule an appointment as soon as possible for a visit in 1 week.   Specialty:  Nephrology   Contact information:   [address] [address] [phone]       Discharge Instructions: Discharge Instructions    Call MD for:  difficulty breathing, headache or visual disturbances    Complete by:  As directed      Call MD for:  temperature >100.4    Complete by:  As directed      Diet - low sodium heart healthy    Complete by:  As directed      Increase activity slowly    Complete by:  As directed            Consultations:  None  Procedures Performed:  Dg  Chest 2 View  01/21/2015   CLINICAL DATA:  29 year old female with a history of fever. Nonsmoker.  EXAM: CHEST - 2 VIEW  COMPARISON:  03/16/2014  FINDINGS: Cardiomediastinal silhouette projects within normal limits in size and contour. Ill-defined interstitial/ airspace disease at the right base. Ill-defined opacity in the retrocardiac region. No pneumothorax or pleural effusion.  .  No displaced fracture.  Unremarkable appearance of the upper abdomen.  IMPRESSION: Interstitial/airspace disease of the bilateral bases, new from the comparison concerning for multifocal infection/bronchitis given the history.  Signed,  Dulcy Fanny. Earleen Newport, DO  [facility]  [facility]   Electronically Signed   By: Corrie Mckusick D.O.   On: 01/21/2015 09:02    Admission HPI: 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 She undergoes MWF dialysis. She went to dialysis today, where she was told her hemoglobin was low and that she needed to go to the ED for transfusion.   She endorses SOB when lying flat, that usually resolves following a dialysis session. She denies SOB when exercising (can walk 2 flights of stairs, or 1 mile). She also endorses subjective fever and chills for the last week (since Friday 7/22). She denies cough, sick contacts, runny nose, sinus congestion, CP, dizziness, N/V, C/D, headache, vision changes, dysuria, hematochezia, or melena. Her last menstrual period was 2 weeks ago. Her periods are regular, lasting 7 days, starting heavy and lightening. She denies prolonged bleeding with her cycles.  She is s/p bilateral renal transplant that failed in may 2016. The transplants are still in place. She remains on her antirejection medications (Tacrolimus, Mycophenylate, and Prednisone). Her nephrologist and transplant surgeon are at [facility] Course by problem list: Principal Problem:   Anemia in chronic kidney disease Active Problems:   HYPERTENSION, BENIGN SYSTEMIC   End stage renal disease   Tachycardia   Pneumonia   Anemia in chronic kidney disease Patient presented with Hgb of 7.6 which was thought to be a large drop from last known prior Hgb on 10.7, but after further review of records from [facility] show a baseline from 7-8. FOBT was negative in ED. Type and crossed. Received 2 units PRBC. Latest Hgb 9.2.   Spoke with nephrology, per they're records they have been following her Hgb which has been in the 6.7-8 range, she is on full dose Mircera 225 micrograms every 2 weeks since May with last dose on 01/18/2015 and also receives IV iron. A reticulocyte count was checked on admission and was inappropriately suppressed given iron replacement and exogenous EPO. Could consider to test for parvovirus given these  results, however as there is no treatment and nephrology advised to keep dialysis appointment on Monday, we did not test for this. Patient will continue with dialysis appointment on afternoon of discharge as scheduled.  ESRD s/p transplant: Patient has MWF dialysis with last session on 01/20/2015. Patient was continued on anti-rejection medications. -Tacrolimus 3 mg BID -Mycophenelate 720 mg BID -Prednisone 5 mg daily  Hypertension: Continued on home medications. -Amlodipine 10 mg daily -Labetalol 300 mg TID  Pneumonia: Patient had complaints of subjective fever and chills prior to admission, denied cough, sick contacts, rhinorrhea, congestion, CP, dizziness, N/V/D, or headache. WBC was elevated likely 2/2 chronic prednisone, however due to her chronic immunosuppression infection was considered. CXR showed interstitial disease of bilateral bases concerning for multifocal infection. Patient was started on ceftriaxone and azithromycin,  however due to concern over her prolonged QTc, she was switched to oral Augmentin 500-125 mg once daily and Doxycycline 100 mg BID for empiric CAP coverage. Patient symptoms have improved during hospital stay.  Discharge Vitals:   BP 132/82 mmHg  Pulse 98  Temp(Src) 98 F (36.7 C) (Oral)  Resp 18  Ht 5\' 1"  (1.549 m)  Wt 67.903 kg (149 lb 11.2 oz)  BMI 28.30 kg/m2  SpO2 98%  LMP 01/06/2015  Discharge Labs:  No results found for this or any previous visit (from the past 24 hour(s)).  Signed: [name], MD 01/23/2015, 1:56 PM    Services Ordered on Discharge: None Equipment Ordered on Discharge: None

## 2015-01-23 NOTE — Discharge Instructions (Signed)
Please go to your normal dialysis today. We will send a summary to Dr. Mercy Moore.   Please continue Doxycycline 100 mg twice a day for the next three days. Also continue your Augmentin 500-125 mg once a day for the next three days. I have prescribed these at your pharmacy.

## 2015-01-23 NOTE — Progress Notes (Signed)
Lab unable to get retic count at this time.  Paged Dr.  Heber [place], don't hold discharge if unable to get lab here.

## 2015-01-23 NOTE — Progress Notes (Signed)
Subjective: Patient is feeling improved today. No overnight events. She states that she is not having any dizziness and is able to walk without issue. Objective: Vital signs in last 24 hours: Filed Vitals:   01/22/15 1107 01/22/15 2051 01/23/15 0433 01/23/15 1017  BP: 142/97 129/80 127/79 132/82  Pulse: 97 96 95 98  Temp: 98 F (36.7 C) 97.7 F (36.5 C) 97.5 F (36.4 C) 98 F (36.7 C)  TempSrc: Oral Oral Oral Oral  Resp: 17 16 18 18   Height:  5\' 1"  (1.549 m)    Weight:  67.903 kg (149 lb 11.2 oz)    SpO2: 98% 94% 98% 98%   Weight change: 1.552 kg (3 lb 6.8 oz) No intake or output data in the 24 hours ending 01/23/15 1032 Physical Exam  Constitutional: She is oriented to person, place, and time.  Cardiovascular: Regular rhythm.   Pulse 97 upper limit of normal.  Pulmonary/Chest: Effort normal and breath sounds normal.  Neurological: She is alert and oriented to person, place, and time.  Skin: Skin is warm.   Lab Results: Basic Metabolic Panel:  Recent Labs Lab 01/21/15 0610 01/22/15 0620  NA 133* 136  K 3.2* 3.3*  CL 92* 91*  CO2 28 26  GLUCOSE 96 81  BUN 9 17  CREATININE 5.21* 7.44*  CALCIUM 8.7* 9.0  MG  --  2.1  PHOS 4.3 5.5*   Liver Function Tests:  Recent Labs Lab 01/20/15 1755 01/21/15 0610  AST 15  --   ALT 7*  --   ALKPHOS 76  --   BILITOT 0.6  --   PROT 8.6*  --   ALBUMIN 2.7* 2.3*   No results for input(s): LIPASE, AMYLASE in the last 168 hours. No results for input(s): AMMONIA in the last 168 hours. CBC:  Recent Labs Lab 01/20/15 1755  01/21/15 1800 01/22/15 0620  WBC 13.9*  < > 13.0* 11.9*  NEUTROABS 10.4*  --   --   --   HGB 7.6*  < > 8.7* 9.2*  HCT 25.5*  < > 27.4* 29.1*  MCV 80.4  < > 79.4 79.5  PLT 276  < > 263 285  < > = values in this interval not displayed. Cardiac Enzymes: No results for input(s): CKTOTAL, CKMB, CKMBINDEX, TROPONINI in the last 168 hours. BNP: No results for input(s): PROBNP in the last 168  hours. D-Dimer: No results for input(s): DDIMER in the last 168 hours. CBG: No results for input(s): GLUCAP in the last 168 hours. Hemoglobin A1C: No results for input(s): HGBA1C in the last 168 hours. Fasting Lipid Panel: No results for input(s): CHOL, HDL, LDLCALC, TRIG, CHOLHDL, LDLDIRECT in the last 168 hours. Thyroid Function Tests: No results for input(s): TSH, T4TOTAL, FREET4, T3FREE, THYROIDAB in the last 168 hours. Coagulation: No results for input(s): LABPROT, INR in the last 168 hours. Anemia Panel:  Recent Labs Lab 01/20/15 2202  VITAMINB12 603  FOLATE 6.3  FERRITIN 2161*  TIBC 136*  IRON 16*  RETICCTPCT 2.1   Urine Drug Screen: Drugs of Abuse     Component Value Date/Time   LABOPIA NEG 06/02/2014 1103   COCAINSCRNUR NEG 06/02/2014 1103   LABBENZ NEG 06/02/2014 1103   LABBENZ NEGATIVE 08/26/2007 0820   AMPHETMU NEG 06/02/2014 1103   AMPHETMU NEGATIVE 08/26/2007 0820   THCU NEG 06/02/2014 1103   LABBARB NEG 06/02/2014 1103    Alcohol Level: No results for input(s): ETH in the last 168 hours. Urinalysis:  Recent  Labs Lab 01/21/15 1343  COLORURINE YELLOW  LABSPEC 1.009  PHURINE 8.5*  GLUCOSEU 100*  HGBUR SMALL*  BILIRUBINUR NEGATIVE  KETONESUR 15*  PROTEINUR >300*  UROBILINOGEN 0.2  NITRITE NEGATIVE  LEUKOCYTESUR NEGATIVE   Micro Results: No results found for this or any previous visit (from the past 240 hour(s)). Studies/Results: No results found. Medications: I have reviewed the patient's current medications. Scheduled Meds: . sodium chloride   Intravenous Once  . amLODipine  10 mg Oral Daily  . amoxicillin-clavulanate  1 tablet Oral Daily  . doxycycline  100 mg Oral Q12H  . labetalol  300 mg Oral TID  . mycophenolate  720 mg Oral BID  . pantoprazole  40 mg Oral Daily  . predniSONE  5 mg Oral Q breakfast  . tacrolimus  3 mg Oral BID   Continuous Infusions:  PRN Meds:.acetaminophen **OR** acetaminophen Assessment/Plan: Principal  Problem:   Anemia Active Problems:   HYPERTENSION, BENIGN SYSTEMIC   End stage renal disease   Tachycardia  29 year old woman with history of ESRD 2/2 to failed bilateral kidney transplant in 2012 on hemodialysis MWF and Mircera every 2 weeks presenting with anemia.  Normocytic Anemia: Patient presented with Hgb of 6.7 which was thought to be a large drop from last known prior Hgb on 10.7, but after further review of records from [facility] show a baseline from 7-8. -s/p 2 units PRBC, latest Hgb is 9.2 -Spoke with nephrology, she is on full dose Mircera 225 micrograms every 2 weeks with last dose on 01/18/2015. There does not seem to be much response to Mircera, will try to check reticulocytes today but no need to hold patient. If not done today, will forward to Nephrology to collect with routine labs. Will discharge and follow up on result, if reticulocyte count suppressed despite Mircera (and IV iron) then consider possible Parvovirus.  CAP:  Patient is feeling some improvement. Latest WBC 11.9.  -Continue oral antibiotics outpatient -Augmentin 500-125 mg once daily for three days -Doxycycline 100 mg BID for three days  ESRD s/p transplant: MWF dialysis w/ last session on 01/20/2015, Mircera 225 micrograms every 2 weeks w/ last dose on 01/18/2015. -Patient to be discharged today and attend her scheduled dialysis this afternoon. -Tacrolimus 3 mg BID -Mycophenylate 720 mg BID -Prednisone 5 mg daily  Hypertension:  -Continue home amlodipine 10 mg daily and Labetalol 300 mg TID      Dispo: Will discharge patient today, discussed with Nephrology that she can maintain her outpatient dialysis this afternoon at 12:30 pm.  The patient does not have a current PCP (No Pcp Per Patient) and does not need an [facility] hospital follow-up appointment after discharge.   The patient does not know have transportation limitations that hinder transportation to clinic appointments.  .Services Needed at time  of discharge: Y = Yes, Blank = No PT:   OT:   RN:   Equipment:   Other:     LOS: 3 days   [name], MD 01/23/2015, 10:32 AM

## 2015-01-25 DIAGNOSIS — N186 End stage renal disease: Secondary | ICD-10-CM | POA: Diagnosis not present

## 2015-01-25 DIAGNOSIS — D631 Anemia in chronic kidney disease: Secondary | ICD-10-CM | POA: Diagnosis not present

## 2015-01-25 DIAGNOSIS — N2581 Secondary hyperparathyroidism of renal origin: Secondary | ICD-10-CM | POA: Diagnosis not present

## 2015-01-25 DIAGNOSIS — D509 Iron deficiency anemia, unspecified: Secondary | ICD-10-CM | POA: Diagnosis not present

## 2015-01-27 DIAGNOSIS — N186 End stage renal disease: Secondary | ICD-10-CM | POA: Diagnosis not present

## 2015-01-27 DIAGNOSIS — D509 Iron deficiency anemia, unspecified: Secondary | ICD-10-CM | POA: Diagnosis not present

## 2015-01-27 DIAGNOSIS — D631 Anemia in chronic kidney disease: Secondary | ICD-10-CM | POA: Diagnosis not present

## 2015-01-27 DIAGNOSIS — N2581 Secondary hyperparathyroidism of renal origin: Secondary | ICD-10-CM | POA: Diagnosis not present

## 2015-01-30 DIAGNOSIS — D509 Iron deficiency anemia, unspecified: Secondary | ICD-10-CM | POA: Diagnosis not present

## 2015-01-30 DIAGNOSIS — N2581 Secondary hyperparathyroidism of renal origin: Secondary | ICD-10-CM | POA: Diagnosis not present

## 2015-01-30 DIAGNOSIS — D631 Anemia in chronic kidney disease: Secondary | ICD-10-CM | POA: Diagnosis not present

## 2015-01-30 DIAGNOSIS — N186 End stage renal disease: Secondary | ICD-10-CM | POA: Diagnosis not present

## 2015-02-01 ENCOUNTER — Encounter (HOSPITAL_COMMUNITY): Payer: Self-pay | Admitting: Family Medicine

## 2015-02-01 ENCOUNTER — Observation Stay (HOSPITAL_COMMUNITY)
Admission: EM | Admit: 2015-02-01 | Discharge: 2015-02-02 | Disposition: A | Discharge status: Home / Self Care | Payer: Medicare Other | Attending: Oncology | Admitting: Oncology

## 2015-02-01 ENCOUNTER — Emergency Department (HOSPITAL_COMMUNITY): Payer: Medicare Other

## 2015-02-01 DIAGNOSIS — D649 Anemia, unspecified: Secondary | ICD-10-CM | POA: Insufficient documentation

## 2015-02-01 DIAGNOSIS — J159 Unspecified bacterial pneumonia: Secondary | ICD-10-CM | POA: Diagnosis not present

## 2015-02-01 DIAGNOSIS — Z992 Dependence on renal dialysis: Secondary | ICD-10-CM | POA: Insufficient documentation

## 2015-02-01 DIAGNOSIS — R0602 Shortness of breath: Secondary | ICD-10-CM | POA: Diagnosis not present

## 2015-02-01 DIAGNOSIS — I120 Hypertensive chronic kidney disease with stage 5 chronic kidney disease or end stage renal disease: Secondary | ICD-10-CM | POA: Diagnosis not present

## 2015-02-01 DIAGNOSIS — D509 Iron deficiency anemia, unspecified: Secondary | ICD-10-CM | POA: Diagnosis not present

## 2015-02-01 DIAGNOSIS — R000 Tachycardia, unspecified: Secondary | ICD-10-CM | POA: Insufficient documentation

## 2015-02-01 DIAGNOSIS — Z79899 Other long term (current) drug therapy: Secondary | ICD-10-CM | POA: Diagnosis not present

## 2015-02-01 DIAGNOSIS — N186 End stage renal disease: Secondary | ICD-10-CM | POA: Diagnosis not present

## 2015-02-01 DIAGNOSIS — I10 Essential (primary) hypertension: Secondary | ICD-10-CM | POA: Diagnosis not present

## 2015-02-01 DIAGNOSIS — J189 Pneumonia, unspecified organism: Secondary | ICD-10-CM | POA: Diagnosis not present

## 2015-02-01 DIAGNOSIS — Z792 Long term (current) use of antibiotics: Secondary | ICD-10-CM | POA: Diagnosis not present

## 2015-02-01 DIAGNOSIS — Z940 Kidney transplant status: Secondary | ICD-10-CM | POA: Diagnosis not present

## 2015-02-01 DIAGNOSIS — R05 Cough: Secondary | ICD-10-CM | POA: Diagnosis present

## 2015-02-01 DIAGNOSIS — D631 Anemia in chronic kidney disease: Secondary | ICD-10-CM | POA: Diagnosis not present

## 2015-02-01 DIAGNOSIS — N2581 Secondary hyperparathyroidism of renal origin: Secondary | ICD-10-CM | POA: Diagnosis not present

## 2015-02-01 LAB — CBC WITH DIFFERENTIAL/PLATELET
BASOS PCT: 0 % (ref 0–1)
Basophils Absolute: 0 10*3/uL (ref 0.0–0.1)
Eosinophils Absolute: 0.6 10*3/uL (ref 0.0–0.7)
Eosinophils Relative: 6 % — ABNORMAL HIGH (ref 0–5)
HEMATOCRIT: 25.6 % — AB (ref 36.0–46.0)
Hemoglobin: 7.7 g/dL — ABNORMAL LOW (ref 12.0–15.0)
Lymphocytes Relative: 17 % (ref 12–46)
Lymphs Abs: 1.8 10*3/uL (ref 0.7–4.0)
MCH: 24.7 pg — AB (ref 26.0–34.0)
MCHC: 30.1 g/dL (ref 30.0–36.0)
MCV: 82.1 fL (ref 78.0–100.0)
MONO ABS: 1 10*3/uL (ref 0.1–1.0)
Monocytes Relative: 9 % (ref 3–12)
NEUTROS PCT: 68 % (ref 43–77)
Neutro Abs: 7.2 10*3/uL (ref 1.7–7.7)
Platelets: 371 10*3/uL (ref 150–400)
RBC: 3.12 MIL/uL — ABNORMAL LOW (ref 3.87–5.11)
RDW: 27 % — ABNORMAL HIGH (ref 11.5–15.5)
WBC: 10.6 10*3/uL — ABNORMAL HIGH (ref 4.0–10.5)

## 2015-02-01 LAB — I-STAT CG4 LACTIC ACID, ED
Lactic Acid, Venous: 1.28 mmol/L (ref 0.5–2.0)
Lactic Acid, Venous: 0.8 mmol/L (ref 0.5–2.0)

## 2015-02-01 LAB — I-STAT CHEM 8, ED
BUN: 5 mg/dL — AB (ref 6–20)
Calcium, Ion: 1.03 mmol/L — ABNORMAL LOW (ref 1.12–1.23)
Chloride: 95 mmol/L — ABNORMAL LOW (ref 101–111)
Creatinine, Ser: 4.3 mg/dL — ABNORMAL HIGH (ref 0.44–1.00)
Glucose, Bld: 69 mg/dL (ref 65–99)
HCT: 28 % — ABNORMAL LOW (ref 36.0–46.0)
HEMOGLOBIN: 9.5 g/dL — AB (ref 12.0–15.0)
Potassium: 3.1 mmol/L — ABNORMAL LOW (ref 3.5–5.1)
Sodium: 137 mmol/L (ref 135–145)
TCO2: 30 mmol/L (ref 0–100)

## 2015-02-01 MED ORDER — LEVOFLOXACIN IN D5W 750 MG/150ML IV SOLN
750.0000 mg | INTRAVENOUS | Status: DC
  Administered 2015-02-01: 750 mg via INTRAVENOUS
  Filled 2015-02-01: qty 150

## 2015-02-01 MED ORDER — PIPERACILLIN-TAZOBACTAM 3.375 G IVPB 30 MIN
3.3750 g | Freq: Once | INTRAVENOUS | Status: AC
Start: 2015-02-01 — End: 2015-02-02
  Administered 2015-02-02: 3.375 g via INTRAVENOUS
  Filled 2015-02-01: qty 50

## 2015-02-01 MED ORDER — LEVOFLOXACIN IN D5W 500 MG/100ML IV SOLN: 500.0000 mg | INTRAVENOUS | Status: DC

## 2015-02-01 MED ORDER — AMLODIPINE BESYLATE 10 MG PO TABS
10.0000 mg | ORAL_TABLET | Freq: Every day | ORAL | Status: DC
  Administered 2015-02-01 – 2015-02-02 (×2): 10 mg via ORAL
  Filled 2015-02-01 (×2): qty 2
  Filled 2015-02-01: qty 1

## 2015-02-01 MED ORDER — LABETALOL HCL 300 MG PO TABS
300.0000 mg | ORAL_TABLET | Freq: Three times a day (TID) | ORAL | Status: DC
  Administered 2015-02-01 – 2015-02-02 (×2): 300 mg via ORAL
  Filled 2015-02-01 (×2): qty 1

## 2015-02-01 MED ORDER — VANCOMYCIN HCL IN DEXTROSE 1-5 GM/200ML-% IV SOLN
1000.0000 mg | Freq: Once | INTRAVENOUS | Status: AC
  Administered 2015-02-02: 1000 mg via INTRAVENOUS
  Filled 2015-02-01: qty 200

## 2015-02-01 MED ORDER — HYDRALAZINE HCL 20 MG/ML IJ SOLN
10.0000 mg | INTRAMUSCULAR | Status: AC
Start: 2015-02-01 — End: 2015-02-02
  Administered 2015-02-02: 10 mg via INTRAVENOUS
  Filled 2015-02-01: qty 1

## 2015-02-01 NOTE — ED Provider Notes (Signed)
CSN: [id number]     Arrival date & time 02/01/15  1620 History   First MD Initiated Contact with Patient 02/01/15 1903     Chief Complaint  Patient presents with  . Cough  . Shortness of Breath     (Consider location/radiation/quality/duration/timing/severity/associated sxs/prior Treatment) HPI Comments: 29 year old female, end-stage renal disease on dialysis, she has a kidney transplant which failed, she still takes her antirejection medications and is immunosuppressed. She had a recent admission to the hospital for anemia, transfusion. She presents to the hospital today after having 2 days of coughing, one day of shortness of breath, was seen at dialysis today and completed her dialysis session, sent here from dialysis for further evaluation of her cough and shortness of breath. This is persistent, she denies any associated fevers or chills, no nausea or vomiting, no abdominal pain or chest pain or back pain. She has not had any medications prior to arrival. She does report intermittent diarrhea which is gradually improving  Patient is a 29 y.o. female presenting with cough and shortness of breath. The history is provided by the patient and medical records.  Cough Associated symptoms: shortness of breath   Shortness of Breath Associated symptoms: cough     Past Medical History  Diagnosis Date  . Hypertension   . History of kidney transplant 2012    kidney failure due to hypertension  . Chronic kidney disease     previous hx dialysis  . Dialysis patient    Past Surgical History  Procedure Laterality Date  . Kidney transplant Bilateral 2012   History reviewed. No pertinent family history. Social History  Substance Use Topics  . Smoking status: Never Smoker   . Smokeless tobacco: Never Used  . Alcohol Use: No   OB History    Gravida Para Term Preterm AB TAB SAB Ectopic Multiple Living   1    1  1    0     Review of Systems  Respiratory: Positive for cough and shortness of  breath.   All other systems reviewed and are negative.     Allergies  Review of patient's allergies indicates no known allergies.  Home Medications   Prior to Admission medications   Medication Sig Start Date End Date Taking? Authorizing Provider  amLODipine (NORVASC) 10 MG tablet Take 10 mg by mouth daily.   Yes Historical Provider, MD  ibuprofen (ADVIL,MOTRIN) 200 MG tablet Take 200 mg by mouth every 6 (six) hours as needed.   Yes Historical Provider, MD  labetalol (NORMODYNE) 300 MG tablet Take 300 mg by mouth 3 (three) times daily.   Yes Historical Provider, MD  medroxyPROGESTERone (DEPO-PROVERA) 150 MG/ML injection Inject 1 mL (150 mg total) into the muscle every 3 (three) months. 12/06/14  Yes Shelly Bombard, MD  mycophenolate (MYFORTIC) 360 MG TBEC EC tablet Take 720 mg by mouth 2 (two) times daily.   Yes Historical Provider, MD  predniSONE (DELTASONE) 5 MG tablet Take 5 mg by mouth daily with breakfast.   Yes Historical Provider, MD  Prenat w/o A Vit-FeFum-FePo-FA (CONCEPT OB) 130-92.4-1 MG CAPS Take 1 capsule by mouth daily. 12/06/14  Yes Shelly Bombard, MD  tacrolimus (PROGRAF) 1 MG capsule Take 3 mg by mouth 2 (two) times daily.    Yes Historical Provider, MD  amoxicillin-clavulanate (AUGMENTIN) 500-125 MG per tablet Take 500 mg by mouth daily. 01/23/15   Historical Provider, MD  doxycycline (VIBRA-TABS) 100 MG tablet Take 100 mg by mouth every 12 (twelve) hours.  01/23/15   Historical Provider, MD   BP 171/104 mmHg  Pulse 101  Temp(Src) 98.5 F (36.9 C) (Oral)  Resp 26  SpO2 94%  LMP 01/06/2015 Physical Exam  Constitutional: She appears well-developed and well-nourished. No distress.  HENT:  Head: Normocephalic and atraumatic.  Mouth/Throat: Oropharynx is clear and moist. No oropharyngeal exudate.  Eyes: Conjunctivae and EOM are normal. Pupils are equal, round, and reactive to light. Right eye exhibits no discharge. Left eye exhibits no discharge. No scleral icterus.   Neck: Normal range of motion. Neck supple. No JVD present. No thyromegaly present.  Cardiovascular: Regular rhythm, normal heart sounds and intact distal pulses.  Exam reveals no gallop and no friction rub.   No murmur heard. Tachycardia, good thrill in the AV fistula of the left forearm, no overlying redness or induration  Pulmonary/Chest: Effort normal and breath sounds normal. No respiratory distress. She has no wheezes. She has no rales.  No increased work of breathing, decreased lung sounds at the bases, no rales or wheezing  Abdominal: Soft. Bowel sounds are normal. She exhibits no distension and no mass. There is no tenderness.  Musculoskeletal: Normal range of motion. She exhibits no edema or tenderness.  Lymphadenopathy:    She has no cervical adenopathy.  Neurological: She is alert. Coordination normal.  Skin: Skin is warm and dry. No rash noted. No erythema.  Psychiatric: She has a normal mood and affect. Her behavior is normal.  Nursing note and vitals reviewed.   ED Course  Procedures (including critical care time) Labs Review Labs Reviewed  CBC WITH DIFFERENTIAL/PLATELET - Abnormal; Notable for the following:    WBC 10.6 (*)    RBC 3.12 (*)    Hemoglobin 7.7 (*)    HCT 25.6 (*)    MCH 24.7 (*)    RDW 27.0 (*)    Eosinophils Relative 6 (*)    All other components within normal limits  I-STAT CHEM 8, ED - Abnormal; Notable for the following:    Potassium 3.1 (*)    Chloride 95 (*)    BUN 5 (*)    Creatinine, Ser 4.30 (*)    Calcium, Ion 1.03 (*)    Hemoglobin 9.5 (*)    HCT 28.0 (*)    All other components within normal limits  I-STAT CG4 LACTIC ACID, ED  I-STAT CG4 LACTIC ACID, ED    Imaging Review Dg Chest 2 View  02/01/2015   CLINICAL DATA:  Recent diagnosis of pneumonia.  Shortness of breath.  EXAM: CHEST  2 VIEW  COMPARISON:  01/21/2015.  FINDINGS: The heart size is mildly enlarged. Small bilateral pleural effusions are new from previous exam. There is  mild interstitial stress set diffuse pulmonary vascular congestion noted. No airspace consolidation.  IMPRESSION: 1. Cardiac enlarged, bilateral pleural effusions and pulmonary vascular congestion.   Electronically Signed   By: Kerby Moors M.D.   On: 02/01/2015 17:01      MDM   Final diagnoses:  Anemia, unspecified anemia type  Malignant hypertension  HCAP (healthcare-associated pneumonia)    The patient is tachycardic, she may be febrile, needed a rectal temperature, severely hypertensive but states that she has not had her evening medications and she came here straight from dialysis. We'll obtain lab work, lactic acid, cardiac monitoring, check rectal temperature, given antihypertensives  I have personally seen and viewed and interpreted the x-ray images, there appears to be a pleural effusion versus an infiltrate posteriorly, this is consistent with her decreased lung  sounds in that same region. Will need further evaluation for pneumonia with lab work. She does not appear toxic or in distress.  Discussed care with the   internal medicine resident, they will admit the patient to the hospital. I have personally viewed and interpreted the imaging and agree with radiologist interpretation.  Meds given in ED:  Medications  amLODipine (NORVASC) tablet 10 mg (10 mg Oral Given 02/01/15 1943)  labetalol (NORMODYNE) tablet 300 mg (300 mg Oral Given 02/01/15 1953)  levofloxacin (LEVAQUIN) IVPB 500 mg (not administered)  vancomycin (VANCOCIN) IVPB 1000 mg/200 mL premix (not administered)  piperacillin-tazobactam (ZOSYN) IVPB 3.375 g (not administered)  hydrALAZINE (APRESOLINE) injection 10 mg (not administered)    New Prescriptions   No medications on file         Noemi Chapel, MD 02/01/15 2338

## 2015-02-01 NOTE — ED Notes (Signed)
Pt here for continued SOB and cough after recent treatment for PNA and finishing her abx.

## 2015-02-01 NOTE — ED Notes (Signed)
Levaquin stopped per Dr. Sabra Heck request.

## 2015-02-01 NOTE — ED Notes (Signed)
Called and spoke with pharmacist in regards to medication. Patient is to receive 750mg  of levoquin at this time, the 500mg  dose is for 48 hours later. Spoke to [employer] in pharmacy.

## 2015-02-01 NOTE — ED Notes (Signed)
2 missed iv attempts by this RN.

## 2015-02-01 NOTE — ED Notes (Signed)
Md notified of pt Hemoglobin of 7.7; Pt's prior hx reviewed by RN and Md

## 2015-02-01 NOTE — ED Notes (Signed)
IV team still at the bedside.

## 2015-02-01 NOTE — ED Notes (Signed)
IV team at bedside 

## 2015-02-01 NOTE — ED Notes (Signed)
Called lab, for chem 8 collection, spoke to [place]. She acknowledges.

## 2015-02-02 DIAGNOSIS — I120 Hypertensive chronic kidney disease with stage 5 chronic kidney disease or end stage renal disease: Secondary | ICD-10-CM | POA: Diagnosis not present

## 2015-02-02 DIAGNOSIS — R000 Tachycardia, unspecified: Secondary | ICD-10-CM | POA: Diagnosis not present

## 2015-02-02 DIAGNOSIS — I517 Cardiomegaly: Secondary | ICD-10-CM

## 2015-02-02 DIAGNOSIS — R0602 Shortness of breath: Secondary | ICD-10-CM

## 2015-02-02 DIAGNOSIS — E8779 Other fluid overload: Secondary | ICD-10-CM | POA: Diagnosis not present

## 2015-02-02 DIAGNOSIS — D631 Anemia in chronic kidney disease: Secondary | ICD-10-CM

## 2015-02-02 DIAGNOSIS — J90 Pleural effusion, not elsewhere classified: Secondary | ICD-10-CM

## 2015-02-02 DIAGNOSIS — Z992 Dependence on renal dialysis: Secondary | ICD-10-CM

## 2015-02-02 DIAGNOSIS — J159 Unspecified bacterial pneumonia: Secondary | ICD-10-CM | POA: Diagnosis not present

## 2015-02-02 DIAGNOSIS — T8612 Kidney transplant failure: Secondary | ICD-10-CM

## 2015-02-02 DIAGNOSIS — E8770 Fluid overload, unspecified: Secondary | ICD-10-CM | POA: Diagnosis not present

## 2015-02-02 DIAGNOSIS — N186 End stage renal disease: Secondary | ICD-10-CM | POA: Diagnosis not present

## 2015-02-02 DIAGNOSIS — D649 Anemia, unspecified: Secondary | ICD-10-CM | POA: Insufficient documentation

## 2015-02-02 LAB — BASIC METABOLIC PANEL
Anion gap: 16 — ABNORMAL HIGH (ref 5–15)
BUN: 9 mg/dL (ref 6–20)
CALCIUM: 9 mg/dL (ref 8.9–10.3)
CO2: 27 mmol/L (ref 22–32)
Chloride: 92 mmol/L — ABNORMAL LOW (ref 101–111)
Creatinine, Ser: 5.34 mg/dL — ABNORMAL HIGH (ref 0.44–1.00)
GFR calc non Af Amer: 10 mL/min — ABNORMAL LOW (ref >60)
GFR, EST AFRICAN AMERICAN: 12 mL/min — AB (ref >60)
Glucose, Bld: 73 mg/dL (ref 65–99)
Potassium: 3.1 mmol/L — ABNORMAL LOW (ref 3.5–5.1)
SODIUM: 135 mmol/L (ref 135–145)

## 2015-02-02 LAB — CBC
HCT: 25.4 % — ABNORMAL LOW (ref 36.0–46.0)
Hemoglobin: 7.7 g/dL — ABNORMAL LOW (ref 12.0–15.0)
MCH: 24.4 pg — ABNORMAL LOW (ref 26.0–34.0)
MCHC: 30.3 g/dL (ref 30.0–36.0)
MCV: 80.6 fL (ref 78.0–100.0)
Platelets: 221 10*3/uL (ref 150–400)
RBC: 3.15 MIL/uL — AB (ref 3.87–5.11)
RDW: 22.7 % — AB (ref 11.5–15.5)
WBC: 12.2 10*3/uL — ABNORMAL HIGH (ref 4.0–10.5)

## 2015-02-02 LAB — MAGNESIUM: Magnesium: 1.9 mg/dL (ref 1.7–2.4)

## 2015-02-02 LAB — MRSA PCR SCREENING: MRSA by PCR: NEGATIVE

## 2015-02-02 MED ORDER — POTASSIUM CHLORIDE 10 MEQ/100ML IV SOLN
10.0000 meq | INTRAVENOUS | Status: AC
  Administered 2015-02-02 (×2): 10 meq via INTRAVENOUS
  Filled 2015-02-02: qty 100

## 2015-02-02 MED ORDER — PROMETHAZINE HCL 25 MG/ML IJ SOLN
12.5000 mg | Freq: Once | INTRAMUSCULAR | Status: DC
  Administered 2015-02-02: 12.5 mg via INTRAVENOUS
  Filled 2015-02-02: qty 1

## 2015-02-02 MED ORDER — ONDANSETRON HCL 4 MG/2ML IJ SOLN
INTRAMUSCULAR | Status: AC
  Filled 2015-02-02: qty 2

## 2015-02-02 MED ORDER — ACETAMINOPHEN 325 MG PO TABS
650.0000 mg | ORAL_TABLET | Freq: Once | ORAL | Status: AC
  Administered 2015-02-02: 650 mg via ORAL
  Filled 2015-02-02: qty 2

## 2015-02-02 MED ORDER — RENA-VITE PO TABS: 1.0000 | ORAL_TABLET | Freq: Every day | ORAL | Status: DC

## 2015-02-02 MED ORDER — ONDANSETRON HCL 4 MG/2ML IJ SOLN: 4.0000 mg | Freq: Three times a day (TID) | INTRAMUSCULAR | Status: DC | PRN

## 2015-02-02 MED ORDER — SODIUM CHLORIDE 0.9 % IJ SOLN
3.0000 mL | Freq: Two times a day (BID) | INTRAMUSCULAR | Status: DC
  Administered 2015-02-02 (×2): 3 mL via INTRAVENOUS

## 2015-02-02 MED ORDER — PREDNISONE 5 MG PO TABS
5.0000 mg | ORAL_TABLET | Freq: Every day | ORAL | Status: DC
  Administered 2015-02-02: 5 mg via ORAL
  Filled 2015-02-02: qty 1

## 2015-02-02 MED ORDER — TACROLIMUS 1 MG PO CAPS
3.0000 mg | ORAL_CAPSULE | Freq: Two times a day (BID) | ORAL | Status: DC
  Administered 2015-02-02 (×2): 3 mg via ORAL
  Filled 2015-02-02 (×2): qty 3

## 2015-02-02 MED ORDER — HEPARIN SODIUM (PORCINE) 5000 UNIT/ML IJ SOLN
5000.0000 [IU] | Freq: Three times a day (TID) | INTRAMUSCULAR | Status: DC
  Filled 2015-02-02: qty 1

## 2015-02-02 MED ORDER — CALCIUM ACETATE (PHOS BINDER) 667 MG PO CAPS: 2001.0000 mg | ORAL_CAPSULE | Freq: Three times a day (TID) | ORAL | Status: DC

## 2015-02-02 MED ORDER — ONDANSETRON HCL 4 MG/2ML IJ SOLN
4.0000 mg | Freq: Once | INTRAMUSCULAR | Status: AC
  Administered 2015-02-02: 4 mg via INTRAVENOUS

## 2015-02-02 MED ORDER — DOXERCALCIFEROL 4 MCG/2ML IV SOLN: 4.0000 ug | INTRAVENOUS | Status: DC

## 2015-02-02 MED ORDER — POTASSIUM CHLORIDE 10 MEQ/100ML IV SOLN
10.0000 meq | Freq: Once | INTRAVENOUS | Status: AC
  Administered 2015-02-02: 10 meq via INTRAVENOUS
  Filled 2015-02-02: qty 100

## 2015-02-02 MED ORDER — ACETAMINOPHEN 325 MG PO TABS: 650.0000 mg | ORAL_TABLET | Freq: Four times a day (QID) | ORAL | Status: DC | PRN

## 2015-02-02 MED ORDER — MYCOPHENOLATE SODIUM 180 MG PO TBEC
720.0000 mg | DELAYED_RELEASE_TABLET | Freq: Two times a day (BID) | ORAL | Status: DC
  Administered 2015-02-02 (×2): 720 mg via ORAL
  Filled 2015-02-02 (×2): qty 4

## 2015-02-02 NOTE — H&P (Signed)
Date: 02/02/2015               Patient Name:  Kerry Cook MRN: [id number]  DOB: Dec 06, 1985 Age / Sex: 29 y.o., female   PCP: No Pcp Per Patient         Medical Service: Internal Medicine Teaching Service         Attending Physician: Dr. Annia Belt, MD    First Contact: Dr. [name] Pager: [phone]  Second Contact: Dr. Berkley Harvey Pager: [phone]       After Hours (After 5p/  First Contact Pager: [phone]  weekends / holidays): Second Contact Pager: [phone]   Chief Complaint: Shortness of breath  History of Present Illness: Ms. Kerry Cook is a 29 y.o. 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  Her kidney transplants failed in May 2016 and remain in place.  She is a MWF dialysis patient.  She was recently admitted to this hospital on July 29 for anemia when found to have a hemoglobin of 6.7.  During that subsequent hospitalization, a routine chest xray showed ill-defined interstitial airspace disease at both bases that was new compared with imaging from September 2015.  She was started on antibiotics to cover a community acquired pneumonia and discharged to home on 8/1 with Augmentin 500-125mg  daily and doxycycline 100mg  bid with improvement of symptoms.  Today, patient is presenting to the emergency department with shortness of breath.  States that she first noticed her symptoms last night and awoke feeling short of breath and associated it with lying flat.  She reports improvement of symptoms after sitting up.  States that her shortness of breath resolved last night and today had been symptom free.  She also endorses a two day history of dry, non-productive cough.  States that she went to her dialysis appointment today without any complaints but was encouraged to come to the emergency department after telling them about her episode of shortness of breath the evening prior.   She reports that she finished her course of antibiotics as prescribed upon discharge and since that time has felt an improvement in appetite and energy up until last night when she became short of breath.  Of note, she denies any fever, chills, vomiting, chest pain.  She also denies any recent long car rides or flights, no unilateral leg swelling or pain, no prior history of DVT, and no family history of clotting disorders.  A 2-view chest xray obtained in the ED read as bilateral pleural effusions and pulmonary vascular congestion.    Meds: Current Facility-Administered Medications  Medication Dose Route Frequency Provider Last Rate Last Dose  . amLODipine (NORVASC) tablet 10 mg  10 mg Oral Daily Noemi Chapel, MD   10 mg at 02/01/15 1943  . labetalol (NORMODYNE) tablet 300 mg  300 mg Oral TID Noemi Chapel, MD   300 mg at 02/01/15 1953  . [START ON 02/03/2015] levofloxacin (LEVAQUIN) IVPB 500 mg  500 mg Intravenous Q48H Noemi Chapel, MD      . piperacillin-tazobactam (ZOSYN) IVPB 3.375 g  3.375 g Intravenous Once Noemi Chapel, MD      . vancomycin (VANCOCIN) IVPB 1000 mg/200 mL premix  1,000 mg Intravenous Once Noemi Chapel, MD       Current Outpatient Prescriptions  Medication Sig Dispense Refill  . amLODipine (NORVASC) 10 MG tablet Take 10 mg by mouth daily.    Marland Kitchen ibuprofen (ADVIL,MOTRIN) 200  MG tablet Take 200 mg by mouth every 6 (six) hours as needed.    . labetalol (NORMODYNE) 300 MG tablet Take 300 mg by mouth 3 (three) times daily.    . medroxyPROGESTERone (DEPO-PROVERA) 150 MG/ML injection Inject 1 mL (150 mg total) into the muscle every 3 (three) months. 1 mL 3  . mycophenolate (MYFORTIC) 360 MG TBEC EC tablet Take 720 mg by mouth 2 (two) times daily.    . predniSONE (DELTASONE) 5 MG tablet Take 5 mg by mouth daily with breakfast.    . Prenat w/o A Vit-FeFum-FePo-FA (CONCEPT OB) 130-92.4-1 MG CAPS Take 1 capsule by mouth daily. 30 capsule 11  . tacrolimus (PROGRAF) 1 MG capsule Take 3 mg  by mouth 2 (two) times daily.     Marland Kitchen amoxicillin-clavulanate (AUGMENTIN) 500-125 MG per tablet Take 500 mg by mouth daily.  0  . doxycycline (VIBRA-TABS) 100 MG tablet Take 100 mg by mouth every 12 (twelve) hours.  0    Allergies: Allergies as of 02/01/2015  . (No Known Allergies)   Past Medical History  Diagnosis Date  . Hypertension   . History of kidney transplant 2012    kidney failure due to hypertension  . Chronic kidney disease     previous hx dialysis  . Dialysis patient    Past Surgical History  Procedure Laterality Date  . Kidney transplant Bilateral 2012   History reviewed. No pertinent family history. Social History   Social History  . Marital Status: Single    Spouse Name: N/A  . Number of Children: N/A  . Years of Education: N/A   Occupational History  . Not on file.   Social History Main Topics  . Smoking status: Never Smoker   . Smokeless tobacco: Never Used  . Alcohol Use: No  . Drug Use: No  . Sexual Activity:    Partners: Male    Birth Control/ Protection: Condom, None   Other Topics Concern  . Not on file   Social History Narrative   Review of Systems: General: Denies fever, chills, diaphoresis, appetite change and fatigue.  Respiratory: Reports SOB, dry cough, orthopnea, PND.  Denies DOE, wheezing. Cardiovascular: Denies chest pain and palpitations.  Gastrointestinal: Denies nausea, vomiting, abdominal pain, diarrhea, constipation, blood in stool and abdominal distention.  Genitourinary: Denies dysuria, urgency, frequency, hematuria, and flank pain. Musculoskeletal: Denies myalgias, back pain, joint swelling, arthralgias and gait problem.  Neurological: Denies dizziness, seizures, syncope, weakness, lightheadedness, numbness and headaches.  Psychiatric/Behavioral: Denies mood changes, confusion, nervousness, sleep disturbance and agitation.  Physical Exam: Blood pressure 172/106, pulse 105, temperature 98.4 F (36.9 C), temperature  source Oral, resp. rate 24, last menstrual period 01/06/2015, SpO2 94 %, unknown if currently breastfeeding.  Physical Exam  Constitutional: She is oriented to person, place, and time. She appears well-developed and well-nourished.  HENT:  Head: Normocephalic and atraumatic.  Eyes: EOM are normal.  Cardiovascular: Regular rhythm, normal heart sounds and intact distal pulses.   Rate is tachycardic  Pulmonary/Chest: Effort normal. No respiratory distress. She has no wheezes.  ? Decreased breath sounds on the right  Abdominal: Soft. Bowel sounds are normal. She exhibits no distension. There is no tenderness.  Neurological: She is alert and oriented to person, place, and time. No cranial nerve deficit.  Skin: Skin is warm and dry.    Lab results: Basic Metabolic Panel:  Recent Labs  02/01/15 2251  NA 137  K 3.1*  CL 95*  GLUCOSE 69  BUN 5*  CREATININE 4.30*   CBC:  Recent Labs  02/01/15 2030 02/01/15 2251  WBC 10.6*  --   NEUTROABS 7.2  --   HGB 7.7* 9.5*  HCT 25.6* 28.0*  MCV 82.1  --   PLT 371  --      Imaging results:  Dg Chest 2 View  02/01/2015   CLINICAL DATA:  Recent diagnosis of pneumonia.  Shortness of breath.  EXAM: CHEST  2 VIEW  COMPARISON:  01/21/2015.  FINDINGS: The heart size is mildly enlarged. Small bilateral pleural effusions are new from previous exam. There is mild interstitial stress set diffuse pulmonary vascular congestion noted. No airspace consolidation.  IMPRESSION: 1. Cardiac enlarged, bilateral pleural effusions and pulmonary vascular congestion.   Electronically Signed   By: Kerby Moors M.D.   On: 02/01/2015 17:01    Assessment & Plan by Problem: Principal Problem:   SOB (shortness of breath) Active Problems:   End stage renal disease on dialysis   History of kidney transplant   Cough   Sepsis   HCAP (healthcare-associated pneumonia)  29 y.o. AA female with past medical history of ESRD secondary to focal sclerosing  glomerulosclerosis s/p failed bilateral kidney transplant (2012).  Her kidney transplants failed in May 2016 and remain in place. She is a MWF dialysis patient and is presenting with shortness of breath.   Shortness of breath -1 day history of shortness of breath while lying flat.  Resolved after sitting up.  Asymptomatic the next day while at dialysis.  Chest xray read as bilateral pleural effusion and pulmonary vascular congestion.  Recently treated for CAP with Augmentin and doxycycline.  Concern for HCAP vs pulmonary edema 2/2 hypertension.  On exam, patient looks well, afebrile, tachycardic (appears chronic), maintaining O2 sats >95%, no shortness of breath.  CBC with WBC of 10.6 (68% PMNs, 17% lymphs, 9% monocytes, 6% eosinophils).  This is improved from her prior leukocytosis likely 2/2 chronic prednisone -patient received dose of vancomycin and zosyn in the ED.  Will hold on any other antibiotic for now as HCAP is lower of the differential given overall appearance of patient and having recently completed full course of antibiotics -pulmonary embolus cannot be ruled out by [facility] alone, but considered unlikely.  Patients Wells criteria for PE of 1.5 assigns her to low risk group.  D-dimer not obtained given likely to be elevated in setting of ESRD.  Patient is tachycardic but this is chronic based on chart review. Can consider imaging if symptoms worsen or diagnosis of PE becomes more likely -possible component of anxiety as patient admits to some shortness of breath when thinking of circumstances in her life -repeat EKG -CBC in the AM  ESRD on HD -patient is MWF dialysis patient.  She did go to dialysis today.  She continues her anti-rejection meds and so we will continue them as an inpatient -Mycophenolate 720mg  bic -Prednisone 5mg  daily -Tacrolimus 3mg  bid -consult to nephrology in AM -BMP in the AM  Hypertension -BP elevated on at admission.  Received Hydralazine 10mg  IV in the ED.   Patient reported that she does not take her BP meds before dialysis and instead waits until after to do so.  Uncertain if she took any BP meds today or how compliant she is with these meds in general. -Home meds are labetolol 300mg  tid and amlodipine 10mg  daily. -continue home meds.  Anemia in CKD -stable  -CBC on admission with H/H of 7.7/25.6 -Per discharge summary from last admission,  Nephrology  states they have been following her Hgb which has been in the 6.7-8 range, she is on full dose Mircera 225 micrograms every 2 weeks since May and also receives IV iron  Diet: Heart healthy  DVT PPX: heparin  Code: Full  Dispo: Disposition is deferred at this time, awaiting improvement of current medical problems. Anticipated discharge in approximately 1-2 day(s).   The patient does not have a current PCP (No Pcp Per Patient) and does need an [facility] hospital follow-up appointment after discharge.  The patient does not have transportation limitations that hinder transportation to clinic appointments.  Signed: Jule Ser, DO 02/02/2015, 12:21 AM

## 2015-02-02 NOTE — Consult Note (Signed)
Indication for Consultation:  Management of ESRD/hemodialysis; anemia, hypertension/volume and secondary hyperparathyroidism  HPI: Kerry Cook is a 29 y.o. female who presented to the ED post HD yesterday with sob. She recives HD MWF @ [facility],  History of HTN and failed kidney transplant. She has been compliant with HD however BP has been quite elevated d/t not starting losaartan and running out of norvasc. She was recently admitted for anemia and PNA and recieved 2 u RBC on 7/29 and was treated for levaquin. Hgb improved to 8.5 on 8/3. She had been having abd pain around transplanted kidney, she was given po steriod taper and was to follow up at [facility] but had not. Reports abd pain has resolved. She reports sob pre HD yesterday which did not resolve during HD so she came to the ED for further eval. Will arrange for HD while admitted  Past Medical History  Diagnosis Date  . Hypertension   . History of kidney transplant 2012    kidney failure due to hypertension  . Chronic kidney disease     previous hx dialysis  . Dialysis patient    Past Surgical History  Procedure Laterality Date  . Kidney transplant Bilateral 2012   History reviewed. No pertinent family history. Social History:  reports that she has never smoked. She has never used smokeless tobacco. She reports that she does not drink alcohol or use illicit drugs. No Known Allergies Prior to Admission medications   Medication Sig Start Date End Date Taking? Authorizing Provider  amLODipine (NORVASC) 10 MG tablet Take 10 mg by mouth daily.   Yes Historical Provider, MD  ibuprofen (ADVIL,MOTRIN) 200 MG tablet Take 200 mg by mouth every 6 (six) hours as needed.   Yes Historical Provider, MD  labetalol (NORMODYNE) 300 MG tablet Take 300 mg by mouth 3 (three) times daily.   Yes Historical Provider, MD  medroxyPROGESTERone (DEPO-PROVERA) 150 MG/ML injection Inject 1 mL (150 mg total) into the muscle every 3 (three) months. 12/06/14  Yes  Shelly Bombard, MD  mycophenolate (MYFORTIC) 360 MG TBEC EC tablet Take 720 mg by mouth 2 (two) times daily.   Yes Historical Provider, MD  predniSONE (DELTASONE) 5 MG tablet Take 5 mg by mouth daily with breakfast.   Yes Historical Provider, MD  Prenat w/o A Vit-FeFum-FePo-FA (CONCEPT OB) 130-92.4-1 MG CAPS Take 1 capsule by mouth daily. 12/06/14  Yes Shelly Bombard, MD  tacrolimus (PROGRAF) 1 MG capsule Take 3 mg by mouth 2 (two) times daily.    Yes Historical Provider, MD  amoxicillin-clavulanate (AUGMENTIN) 500-125 MG per tablet Take 500 mg by mouth daily. 01/23/15   Historical Provider, MD  doxycycline (VIBRA-TABS) 100 MG tablet Take 100 mg by mouth every 12 (twelve) hours. 01/23/15   Historical Provider, MD   Current Facility-Administered Medications  Medication Dose Route Frequency Provider Last Rate Last Dose  . acetaminophen (TYLENOL) tablet 650 mg  650 mg Oral Q6H PRN Jule Ser, DO      . amLODipine (NORVASC) tablet 10 mg  10 mg Oral Daily Noemi Chapel, MD   10 mg at 02/02/15 1025  . heparin injection 5,000 Units  5,000 Units Subcutaneous 3 times per day Corky Sox, MD   5,000 Units at 02/02/15 0600  . labetalol (NORMODYNE) tablet 300 mg  300 mg Oral TID Noemi Chapel, MD   300 mg at 02/02/15 1025  . mycophenolate (MYFORTIC) EC tablet 720 mg  720 mg Oral BID Corky Sox, MD  720 mg at 02/02/15 1025  . ondansetron (ZOFRAN) 4 MG/2ML injection           . predniSONE (DELTASONE) tablet 5 mg  5 mg Oral Q breakfast Corky Sox, MD   5 mg at 02/02/15 0900  . sodium chloride 0.9 % injection 3 mL  3 mL Intravenous Q12H Corky Sox, MD   3 mL at 02/02/15 1135  . tacrolimus (PROGRAF) capsule 3 mg  3 mg Oral BID Corky Sox, MD   3 mg at 02/02/15 1025   Labs: Basic Metabolic Panel:  Recent Labs Lab 02/01/15 2251 02/02/15 0657  NA 137 135  K 3.1* 3.1*  CL 95* 92*  CO2  --  27  GLUCOSE 69 73  BUN 5* 9  CREATININE 4.30* 5.34*  CALCIUM  --  9.0   Liver Function Tests: No  results for input(s): AST, ALT, ALKPHOS, BILITOT, PROT, ALBUMIN in the last 168 hours. No results for input(s): LIPASE, AMYLASE in the last 168 hours. No results for input(s): AMMONIA in the last 168 hours. CBC:  Recent Labs Lab 02/01/15 2030 02/01/15 2251 02/02/15 0657  WBC 10.6*  --  12.2*  NEUTROABS 7.2  --   --   HGB 7.7* 9.5* 7.7*  HCT 25.6* 28.0* 25.4*  MCV 82.1  --  80.6  PLT 371  --  221   Cardiac Enzymes: No results for input(s): CKTOTAL, CKMB, CKMBINDEX, TROPONINI in the last 168 hours. CBG: No results for input(s): GLUCAP in the last 168 hours. Iron Studies: No results for input(s): IRON, TIBC, TRANSFERRIN, FERRITIN in the last 72 hours. Studies/Results: Dg Chest 2 View  02/01/2015   CLINICAL DATA:  Recent diagnosis of pneumonia.  Shortness of breath.  EXAM: CHEST  2 VIEW  COMPARISON:  01/21/2015.  FINDINGS: The heart size is mildly enlarged. Small bilateral pleural effusions are new from previous exam. There is mild interstitial stress set diffuse pulmonary vascular congestion noted. No airspace consolidation.  IMPRESSION: 1. Cardiac enlarged, bilateral pleural effusions and pulmonary vascular congestion.   Electronically Signed   By: Kerby Moors M.D.   On: 02/01/2015 17:01    Review of Systems: Denies chest pain Reports sob resolved. Denies cough/fever/chills Denies abd pain. N/v/diarrhea Denies Le edema  Physical Exam: Filed Vitals:   02/02/15 0009 02/02/15 0038 02/02/15 0531 02/02/15 1040  BP: 172/106 160/101 166/100 116/60  Pulse: 105 103 99 98  Temp: 98.4 F (36.9 C) 98.6 F (37 C) 97.7 F (36.5 C) 97 F (36.1 C)  TempSrc: Oral   Oral  Resp: 24 21 18 18   Height:  5\' 1"  (1.549 m)    Weight:  64 kg (141 lb 1.5 oz)    SpO2: 94% 97% 92% 96%     General: Well developed, well nourished, in no acute distress. Laying in bed Head: Normocephalic, atraumatic, sclera non-icteric, mucus membranes are moist Neck: Supple. JVD not elevated. Lungs: Clear  bilaterally to auscultation without wheezes, rales, or rhonchi. Breathing is unlabored. Heart: RRR tachy with S1 S2. No murmurs, rubs, or gallops appreciated. Abdomen: Soft, non-tender, non-distended with normoactive bowel sounds. No rebound/guarding. No obvious abdominal masses. M-S:  Strength and tone appear normal for age. Lower extremities:without edema or ischemic changes, no open wounds  Neuro: Alert and oriented X 3. Moves all extremities spontaneously. Psych:  Responds to questions appropriately with a normal affect. Dialysis Access: LAVF +b/t   Dialysis Orders:  MWF [facility] 3:45   65kgs    2K/2.25ca  AVF 6900u heparin hectorol 4  venofer 50 q week micera 225 q 2 weeks- last given 8/10  Assessment/Plan: 1.  SOB- recently completed antibiotics for PNA- tmax 99.9 last night. Vanc and zosyn given in ED- no current antibx ordered. pulm edema on xray- Cont volume removal in HD.  2.  ESRD -  MWF @ [facility]. K+ 3.1- replaced 3.  Hypertension/volume  - SBP >200 on admission down to 116/60- per notes in outpt center pt had been out of her norvasc and never picke up losartaan. Post HD BP yesterday 218/121. Left under edw at 64.3 kgs- cont to challenge and lower edw. Prior edw was actually 66.5 kgs and just lowered post HD yesterday to 65kgs 4.  Anemia  - hgb 7.7 was 8.5 on 8/3 s/p 2u RBC 7/29. Micera given 8/10 watch CBC, transfuse prn  5.  Metabolic bone disease -  Last phos 4.9 and  PTH 202- cont binder and hectorol 6.  Nutrition - renal diet. Vitamin.  7. Failed transplant- cont immunosuppression. Recent po steroid taper. No pain around transplanted kidney  Shelle Iron, NP [facility] [phone] 02/02/2015, 1:20 PM   Pt seen, examined and agree w A/P as above. ESRD pt on HD x 2 mos (HTN> ESRD) presenting with SOB and pulm edema. She is under dry wt, having BP issues recently, all consistent with loss of lean body weight. Plan HD tomorrow w max UF, possibly HD today if  needed. Kelly Splinter MD pager [phone]    cell [phone] 02/02/2015, 2:52 PM

## 2015-02-02 NOTE — Progress Notes (Signed)
Subjective: Patient states her SOB of breath is improved. She is laying in bed, breathing well. States that she is feeling well. She did have one episode of nausea and bilious vomiting coinciding with potassium infusion which has quickly resolved. Objective: Vital signs in last 24 hours: Filed Vitals:   02/02/15 0009 02/02/15 0038 02/02/15 0531 02/02/15 1040  BP: 172/106 160/101 166/100 116/60  Pulse: 105 103 99 98  Temp: 98.4 F (36.9 C) 98.6 F (37 C) 97.7 F (36.5 C) 97 F (36.1 C)  TempSrc: Oral   Oral  Resp: 24 21 18 18   Height:  5\' 1"  (1.549 m)    Weight:  141 lb 1.5 oz (64 kg)    SpO2: 94% 97% 92% 96%   General: resting in bed HEENT: EOMI, no scleral icterus Cardiac: tachycardic, regular rhythm, no rubs, murmurs or gallops Pulm: clear to auscultation bilaterally, moving normal volumes of air Abd: soft, nontender, nondistended Ext: warm and well perfused, no pedal edema Neuro: alert and oriented X3 Lab Results: Basic Metabolic Panel:  Recent Labs Lab 02/01/15 2251 02/02/15 0657  NA 137 135  K 3.1* 3.1*  CL 95* 92*  CO2  --  27  GLUCOSE 69 73  BUN 5* 9  CREATININE 4.30* 5.34*  CALCIUM  --  9.0  MG  --  1.9   CBC:  Recent Labs Lab 02/01/15 2030 02/01/15 2251 02/02/15 0657  WBC 10.6*  --  12.2*  NEUTROABS 7.2  --   --   HGB 7.7* 9.5* 7.7*  HCT 25.6* 28.0* 25.4*  MCV 82.1  --  80.6  PLT 371  --  221      Micro Results: Recent Results (from the past 240 hour(s))  MRSA PCR Screening     Status: None   Collection Time: 02/02/15  2:14 AM  Result Value Ref Range Status   MRSA by PCR NEGATIVE NEGATIVE Final    Comment:        The GeneXpert MRSA Assay (FDA approved for NASAL specimens only), is one component of a comprehensive MRSA colonization surveillance program. It is not intended to diagnose MRSA infection nor to guide or monitor treatment for MRSA infections.    Studies/Results: Dg Chest 2 View  02/01/2015   CLINICAL DATA:  Recent  diagnosis of pneumonia.  Shortness of breath.  EXAM: CHEST  2 VIEW  COMPARISON:  01/21/2015.  FINDINGS: The heart size is mildly enlarged. Small bilateral pleural effusions are new from previous exam. There is mild interstitial stress set diffuse pulmonary vascular congestion noted. No airspace consolidation.  IMPRESSION: 1. Cardiac enlarged, bilateral pleural effusions and pulmonary vascular congestion.   Electronically Signed   By: Kerby Moors M.D.   On: 02/01/2015 17:01   Medications: I have reviewed the patient's current medications. Scheduled Meds: . amLODipine  10 mg Oral Daily  . calcium acetate  2,001 mg Oral TID WC  . [START ON 02/03/2015] doxercalciferol  4 mcg Intravenous Q M,W,F-HD  . heparin  5,000 Units Subcutaneous 3 times per day  . labetalol  300 mg Oral TID  . multivitamin  1 tablet Oral QHS  . mycophenolate  720 mg Oral BID  . ondansetron      . predniSONE  5 mg Oral Q breakfast  . sodium chloride  3 mL Intravenous Q12H  . tacrolimus  3 mg Oral BID   PRN Meds:.acetaminophen Assessment/Plan: Principal Problem:   SOB (shortness of breath) Active Problems:   End stage renal disease  on dialysis   History of kidney transplant   Cough   HCAP (healthcare-associated pneumonia)  Shortness of Breath: Patient has 1 day history SOB while lying flat which was resolved after sitting up. She was asymptomatic while at dialysis yesterday, but was informed to come to the ED after she mentioned her prior symptoms. CXR read as bilateral pleural effusion and pulmonary vascular congestion. She was recently treated for CAP w/ Augmentin & Doxycycline after previous hospital discharge on 01/23/2015. Concern more likely for pulmonary edema/volume overload due to renal disease. Less likely concern for HCAP as she has been afebrile with only slight increase in WBC, normal spO2s, and asymptomatic. She did receive vancomycin and zosyn in the ED, which were discontinued due to low suspicion of  infectious process.  -Pulse ox while ambulating -Continue with Hemodialysis tomorrow with volume removal -Patient likely to be discharged today and follow up with nephrology   ESRD on HD s/p 2 failed transplants: Patient has MWF hemodialysis at [facility]. She did go to dialysis yesterday and has next appointment tomorrow. She is continued on her anti-rejection medications to be tapered off per nephrology.  -Continue mycophenelate 720 mg bid, tacrolimus 3 mg bid, prednisone 5 mg po daily -Continue with hemodialysis tomorrow and nephrology follow up  Anemia in CKD: CBC on admission w/ H/H of 7.7/25.6 which is consistent with baseline of 6.7-8. She receives Mircera 225 mcg every 2 weeks since may as well as IV iron. Anemia likely attributed to renal disease as well as immunosuppressant therapy. -Hgb stable, no changes in treatment  Long QT: Patient's most recent EKG shows long QTc of 676 ms compared to 533 ms from 2 weeks ago. Patient has no chest pains, palpitations, or changes in symptoms. -Repeat EKG shows QTc of 557 ms, no other changes from prior EKGs  HTN: continue home medications -Labetolol 300 mg tid -Amlodipine 10 mg daily  Diet: Renal  DVT ppx: heparin      Dispo: Disposition is deferred at this time, awaiting improvement of current medical problems.  Anticipated discharge in approximately 0-1 day(s).   The patient does not have a current PCP (No Pcp Per Patient) and does need an [facility] hospital follow-up appointment after discharge.  The patient does not have transportation limitations that hinder transportation to clinic appointments.  .Services Needed at time of discharge: Y = Yes, Blank = No PT:   OT:   RN:   Equipment:   Other:       [name], MD 02/02/2015, 2:27 PM

## 2015-02-02 NOTE — Discharge Instructions (Signed)
Your shortness of breath is likely due to some fluid build up. Please continue with your hemodialysis tomorrow which should help with volume removal.  Please also schedule a follow up visit with your kidney doctor next week for management of your medications.

## 2015-02-02 NOTE — Progress Notes (Signed)
02/02/2015 3:57 PM  Reviewed discharge instructions with patient, including medications, discharge instructions and follow up appointments.  She verbalized understanding and asked questions appropriately.  Pt currently receiving last run of potassium and will discharge after this is complete. Princella Pellegrini

## 2015-02-02 NOTE — Progress Notes (Signed)
New Admission Note:   Arrival Method:  Via stretcher Mental Orientation: A&Ox4 Telemetry: ST Assessment: Completed Skin: Intact IV: NSL Pain: c/o headache 9/10 PRN pain med given Tubes: n/a Safety Measures: Safety Fall Prevention Plan has been given, discussed and signed Admission: Completed [address] [place] Orientation: Patient has been orientated to the room, unit and staff.  Family: none present  Orders have been reviewed and implemented. Will continue to monitor the patient. Call light has been placed within reach and bed alarm has been activated.   Dudley Major BSN, Consulting civil engineer number: [phone]

## 2015-02-02 NOTE — Discharge Summary (Signed)
Name: Kerry Cook MRN: [id number] DOB: 07/05/1985 29 y.o. PCP: No Pcp Per Kerry Cook  Date of Admission: 02/01/2015  6:36 PM Date of Discharge: 02/02/2015 Attending Physician: No att. providers found  Discharge Diagnosis: 1. SOB  Principal Problem:   SOB (shortness of breath) Active Problems:   End stage renal disease on dialysis   History of kidney transplant   Absolute anemia  Discharge Medications:   Medication List    STOP taking these medications        amoxicillin-clavulanate 500-125 MG per tablet  Commonly known as:  AUGMENTIN     doxycycline 100 MG tablet  Commonly known as:  VIBRA-TABS      TAKE these medications        amLODipine 10 MG tablet  Commonly known as:  NORVASC  Take 10 mg by mouth daily.     CONCEPT OB 130-92.4-1 MG Caps  Take 1 capsule by mouth daily.     ibuprofen 200 MG tablet  Commonly known as:  ADVIL,MOTRIN  Take 200 mg by mouth every 6 (six) hours as needed.     labetalol 300 MG tablet  Commonly known as:  NORMODYNE  Take 300 mg by mouth 3 (three) times daily.     medroxyPROGESTERone 150 MG/ML injection  Commonly known as:  DEPO-PROVERA  Inject 1 mL (150 mg total) into the muscle every 3 (three) months.     mycophenolate 360 MG Tbec EC tablet  Commonly known as:  MYFORTIC  Take 720 mg by mouth 2 (two) times daily.     predniSONE 5 MG tablet  Commonly known as:  DELTASONE  Take 5 mg by mouth daily with breakfast.     tacrolimus 1 MG capsule  Commonly known as:  PROGRAF  Take 3 mg by mouth 2 (two) times daily.        Disposition and follow-up:   Kerry Cook was discharged from [facility] in Good condition.  At the hospital follow up visit please address:  1.  Pulmonary edema: Kerry Cook has mild fluid overload likely 2/2 ESRD and uncontrolled HTN. Will need volume removal during hemodialysis. Please also follow up on any recurring symptoms.  Hemodialysis: Kerry Cook is to continue with  scheduled MWF hemodialysis and follow up with nephrology for tapering of anti-rejection medications.  2.  Labs / imaging needed at time of follow-up: none  3.  Pending labs/ test needing follow-up: none  Follow-up Appointments: Follow-up Information    Follow up with No PCP Per Kerry Cook.   Specialty:  General Practice      Follow up with Windy Kalata, MD. Schedule an appointment as soon as possible for a visit in 1 week.   Specialty:  Nephrology   Contact information:   [place] [address] [phone]       Discharge Instructions: Discharge Instructions    Call MD for:  difficulty breathing, headache or visual disturbances    Complete by:  As directed      Call MD for:  extreme fatigue    Complete by:  As directed      Call MD for:  persistant nausea and vomiting    Complete by:  As directed      Call MD for:  severe uncontrolled pain    Complete by:  As directed      Call MD for:  temperature >100.4    Complete by:  As directed      Diet -  low sodium heart healthy    Complete by:  As directed      Increase activity slowly    Complete by:  As directed            Consultations: Treatment Team:  Roney Jaffe, MD  Procedures Performed:  Dg Chest 2 View  02/01/2015   CLINICAL DATA:  Recent diagnosis of pneumonia.  Shortness of breath.  EXAM: CHEST  2 VIEW  COMPARISON:  01/21/2015.  FINDINGS: The heart size is mildly enlarged. Small bilateral pleural effusions are new from previous exam. There is mild interstitial stress set diffuse pulmonary vascular congestion noted. No airspace consolidation.  IMPRESSION: 1. Cardiac enlarged, bilateral pleural effusions and pulmonary vascular congestion.   Electronically Signed   By: Kerby Moors M.D.   On: 02/01/2015 17:01   Dg Chest 2 View  01/21/2015   CLINICAL DATA:  29 year old female with a history of fever. Nonsmoker.  EXAM: CHEST - 2 VIEW  COMPARISON:  03/16/2014  FINDINGS: Cardiomediastinal silhouette  projects within normal limits in size and contour. Ill-defined interstitial/ airspace disease at the right base. Ill-defined opacity in the retrocardiac region. No pneumothorax or pleural effusion.  .  No displaced fracture.  Unremarkable appearance of the upper abdomen.  IMPRESSION: Interstitial/airspace disease of the bilateral bases, new from the comparison concerning for multifocal infection/bronchitis given the history.  Signed,  Dulcy Fanny. Earleen Newport, DO  [facility]  [facility]   Electronically Signed   By: Corrie Mckusick D.O.   On: 01/21/2015 09:02    2D Echo: 08/26/2007 EF 55%, moderate to markedly increased LV wall thickness, mild mitral regurgitation, mildly dilated LA, small pericardial effusion   Cardiac Cath: none  Admission HPI: Kerry Cook is a 29 y.o. 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 Her kidney transplants failed in May 2016 and remain in place. She is a MWF dialysis Kerry Cook. She was recently admitted to this hospital on July 29 for anemia when found to have a hemoglobin of 6.7. During that subsequent hospitalization, a routine chest xray showed ill-defined interstitial airspace disease at both bases that was new compared with imaging from September 2015. She was started on antibiotics to cover a community acquired pneumonia and discharged to home on 8/1 with Augmentin 500-125mg  daily and doxycycline 100mg  bid with improvement of symptoms. Today, Kerry Cook is presenting to the emergency department with shortness of breath. States that she first noticed her symptoms last night and awoke feeling short of breath and associated it with lying flat. She reports improvement of symptoms after sitting up. States that her shortness of breath resolved last night and today had been symptom free. She also endorses a two day history of dry,  non-productive cough. States that she went to her dialysis appointment today without any complaints but was encouraged to come to the emergency department after telling them about her episode of shortness of breath the evening prior. She reports that she finished her course of antibiotics as prescribed upon discharge and since that time has felt an improvement in appetite and energy up until last night when she became short of breath. Of note, she denies any fever, chills, vomiting, chest pain. She also denies any recent long car rides or flights, no unilateral leg swelling or pain, no prior history of DVT, and no family history of clotting disorders. A 2-view chest xray obtained in the ED read as  bilateral pleural effusions and pulmonary vascular congestion.   Hospital Course by problem list: Principal Problem:   SOB (shortness of breath) Active Problems:   End stage renal disease on dialysis   History of kidney transplant   Absolute anemia   SOB: A 2-view chest xray obtained in the ED read as bilateral pleural effusions and pulmonary vascular congestion. Concern for HCAP vs pulmonary edema secondary to hypertension. She was given one dose of vancomycin and zosyn in the emergency department but was discontinued upon admission due to recently completed antibiotics, no leukocytosis, afebrile, and asymptomatic. There was no obvious evidence for infection. The shortness of breath had resolved and the Kerry Cook had no other acute episodes. More than likely mild fluid overload 2/2 ESRD and HTN.  ESRD on dialysis, s/p failed kidney transplant x2: The Kerry Cook is still on immunosuppressants from the kidney transplants, though both transplants had already failed. She is to be tapered off these per Nephrology. She was continued on her home tacrolimus, mycophenolate, and prednisone. She will continue her regular scheduled dialysis outpatient at [facility] after being discharged.   Anemia in CKD: CBC on  admission w/ H/H of 7.7/25.6 which is consistent with baseline of 6.7-8. She receives Mircera 225 mcg every 2 weeks since may as well as IV iron. Anemia likely attributed to renal disease as well as immunosuppressant therapy. There were no changes in her treatment.  HTN: The Kerry Cook was hypertensive on admission at 205/120 mmHg. She was given hydralazine 10mg  IV and her blood pressure dropped to the [id number] systolic. She noted that she had not taken her blood pressure medicine the day of admission since she was attending dialysis that day. She was continue on her home medications labetolol 300 mg tid and amlodipine 10 mg daily.   Discharge Vitals:   BP 116/60 mmHg  Pulse 98  Temp(Src) 97 F (36.1 C) (Oral)  Resp 18  Ht 5\' 1"  (1.549 m)  Wt 141 lb 1.5 oz (64 kg)  BMI 26.67 kg/m2  SpO2 96%  LMP 01/06/2015  Discharge Labs:  Results for orders placed or performed during the hospital encounter of 02/01/15 (from the past 24 hour(s))  CBC with Differential/Platelet     Status: Abnormal   Collection Time: 02/01/15  8:30 PM  Result Value Ref Range   WBC 10.6 (H) 4.0 - 10.5 K/uL   RBC 3.12 (L) 3.87 - 5.11 MIL/uL   Hemoglobin 7.7 (L) 12.0 - 15.0 g/dL   HCT 25.6 (L) 36.0 - 46.0 %   MCV 82.1 78.0 - 100.0 fL   MCH 24.7 (L) 26.0 - 34.0 pg   MCHC 30.1 30.0 - 36.0 g/dL   RDW 27.0 (H) 11.5 - 15.5 %   Platelets 371 150 - 400 K/uL   Neutrophils Relative % 68 43 - 77 %   Lymphocytes Relative 17 12 - 46 %   Monocytes Relative 9 3 - 12 %   Eosinophils Relative 6 (H) 0 - 5 %   Basophils Relative 0 0 - 1 %   Neutro Abs 7.2 1.7 - 7.7 K/uL   Lymphs Abs 1.8 0.7 - 4.0 K/uL   Monocytes Absolute 1.0 0.1 - 1.0 K/uL   Eosinophils Absolute 0.6 0.0 - 0.7 K/uL   Basophils Absolute 0.0 0.0 - 0.1 K/uL   RBC Morphology POLYCHROMASIA PRESENT   I-Stat CG4 Lactic Acid, ED     Status: None   Collection Time: 02/01/15  8:41 PM  Result Value Ref Range   Lactic  Acid, Venous 1.28 0.5 - 2.0 mmol/L  I-Stat CG4 Lactic Acid,  ED     Status: None   Collection Time: 02/01/15 10:31 PM  Result Value Ref Range   Lactic Acid, Venous 0.80 0.5 - 2.0 mmol/L  I-stat chem 8, ed     Status: Abnormal   Collection Time: 02/01/15 10:51 PM  Result Value Ref Range   Sodium 137 135 - 145 mmol/L   Potassium 3.1 (L) 3.5 - 5.1 mmol/L   Chloride 95 (L) 101 - 111 mmol/L   BUN 5 (L) 6 - 20 mg/dL   Creatinine, Ser 4.30 (H) 0.44 - 1.00 mg/dL   Glucose, Bld 69 65 - 99 mg/dL   Calcium, Ion 1.03 (L) 1.12 - 1.23 mmol/L   TCO2 30 0 - 100 mmol/L   Hemoglobin 9.5 (L) 12.0 - 15.0 g/dL   HCT 28.0 (L) 36.0 - 46.0 %  MRSA PCR Screening     Status: None   Collection Time: 02/02/15  2:14 AM  Result Value Ref Range   MRSA by PCR NEGATIVE NEGATIVE  Magnesium     Status: None   Collection Time: 02/02/15  6:57 AM  Result Value Ref Range   Magnesium 1.9 1.7 - 2.4 mg/dL  CBC     Status: Abnormal   Collection Time: 02/02/15  6:57 AM  Result Value Ref Range   WBC 12.2 (H) 4.0 - 10.5 K/uL   RBC 3.15 (L) 3.87 - 5.11 MIL/uL   Hemoglobin 7.7 (L) 12.0 - 15.0 g/dL   HCT 25.4 (L) 36.0 - 46.0 %   MCV 80.6 78.0 - 100.0 fL   MCH 24.4 (L) 26.0 - 34.0 pg   MCHC 30.3 30.0 - 36.0 g/dL   RDW 22.7 (H) 11.5 - 15.5 %   Platelets 221 150 - 400 K/uL  Basic metabolic panel     Status: Abnormal   Collection Time: 02/02/15  6:57 AM  Result Value Ref Range   Sodium 135 135 - 145 mmol/L   Potassium 3.1 (L) 3.5 - 5.1 mmol/L   Chloride 92 (L) 101 - 111 mmol/L   CO2 27 22 - 32 mmol/L   Glucose, Bld 73 65 - 99 mg/dL   BUN 9 6 - 20 mg/dL   Creatinine, Ser 5.34 (H) 0.44 - 1.00 mg/dL   Calcium 9.0 8.9 - 10.3 mg/dL   GFR calc non Af Amer 10 (L) >60 mL/min   GFR calc Af Amer 12 (L) >60 mL/min   Anion gap 16 (H) 5 - 15    Signed: [name], MD 02/02/2015, 6:25 PM    Services Ordered on Discharge: none Equipment Ordered on Discharge: none

## 2015-02-03 DIAGNOSIS — D509 Iron deficiency anemia, unspecified: Secondary | ICD-10-CM | POA: Diagnosis not present

## 2015-02-03 DIAGNOSIS — N186 End stage renal disease: Secondary | ICD-10-CM | POA: Diagnosis not present

## 2015-02-03 DIAGNOSIS — N2581 Secondary hyperparathyroidism of renal origin: Secondary | ICD-10-CM | POA: Diagnosis not present

## 2015-02-03 DIAGNOSIS — D631 Anemia in chronic kidney disease: Secondary | ICD-10-CM | POA: Diagnosis not present

## 2015-02-06 DIAGNOSIS — N186 End stage renal disease: Secondary | ICD-10-CM | POA: Diagnosis not present

## 2015-02-06 DIAGNOSIS — N2581 Secondary hyperparathyroidism of renal origin: Secondary | ICD-10-CM | POA: Diagnosis not present

## 2015-02-06 DIAGNOSIS — D631 Anemia in chronic kidney disease: Secondary | ICD-10-CM | POA: Diagnosis not present

## 2015-02-06 DIAGNOSIS — D509 Iron deficiency anemia, unspecified: Secondary | ICD-10-CM | POA: Diagnosis not present

## 2015-02-08 DIAGNOSIS — D631 Anemia in chronic kidney disease: Secondary | ICD-10-CM | POA: Diagnosis not present

## 2015-02-08 DIAGNOSIS — N186 End stage renal disease: Secondary | ICD-10-CM | POA: Diagnosis not present

## 2015-02-08 DIAGNOSIS — D509 Iron deficiency anemia, unspecified: Secondary | ICD-10-CM | POA: Diagnosis not present

## 2015-02-08 DIAGNOSIS — N2581 Secondary hyperparathyroidism of renal origin: Secondary | ICD-10-CM | POA: Diagnosis not present

## 2015-02-10 ENCOUNTER — Emergency Department (HOSPITAL_COMMUNITY)
Admission: EM | Admit: 2015-02-10 | Discharge: 2015-02-10 | Disposition: A | Discharge status: Home / Self Care | Payer: Medicare Other | Attending: Emergency Medicine | Admitting: Emergency Medicine

## 2015-02-10 ENCOUNTER — Encounter (HOSPITAL_COMMUNITY): Payer: Self-pay | Admitting: Vascular Surgery

## 2015-02-10 DIAGNOSIS — I120 Hypertensive chronic kidney disease with stage 5 chronic kidney disease or end stage renal disease: Secondary | ICD-10-CM | POA: Diagnosis not present

## 2015-02-10 DIAGNOSIS — D638 Anemia in other chronic diseases classified elsewhere: Secondary | ICD-10-CM | POA: Diagnosis not present

## 2015-02-10 DIAGNOSIS — Z940 Kidney transplant status: Secondary | ICD-10-CM | POA: Insufficient documentation

## 2015-02-10 DIAGNOSIS — Z992 Dependence on renal dialysis: Secondary | ICD-10-CM | POA: Insufficient documentation

## 2015-02-10 DIAGNOSIS — Z7952 Long term (current) use of systemic steroids: Secondary | ICD-10-CM | POA: Diagnosis not present

## 2015-02-10 DIAGNOSIS — Z79899 Other long term (current) drug therapy: Secondary | ICD-10-CM | POA: Insufficient documentation

## 2015-02-10 DIAGNOSIS — D509 Iron deficiency anemia, unspecified: Secondary | ICD-10-CM | POA: Diagnosis not present

## 2015-02-10 DIAGNOSIS — N186 End stage renal disease: Secondary | ICD-10-CM | POA: Diagnosis not present

## 2015-02-10 DIAGNOSIS — R7989 Other specified abnormal findings of blood chemistry: Secondary | ICD-10-CM | POA: Diagnosis present

## 2015-02-10 DIAGNOSIS — D631 Anemia in chronic kidney disease: Secondary | ICD-10-CM | POA: Insufficient documentation

## 2015-02-10 DIAGNOSIS — N2581 Secondary hyperparathyroidism of renal origin: Secondary | ICD-10-CM | POA: Diagnosis not present

## 2015-02-10 LAB — CBC
HEMATOCRIT: 23.5 % — AB (ref 36.0–46.0)
HEMOGLOBIN: 7.2 g/dL — AB (ref 12.0–15.0)
MCH: 24 pg — ABNORMAL LOW (ref 26.0–34.0)
MCHC: 30.6 g/dL (ref 30.0–36.0)
MCV: 78.3 fL (ref 78.0–100.0)
Platelets: 158 10*3/uL (ref 150–400)
RBC: 3 MIL/uL — ABNORMAL LOW (ref 3.87–5.11)
RDW: 23.7 % — ABNORMAL HIGH (ref 11.5–15.5)
WBC: 10.3 10*3/uL (ref 4.0–10.5)

## 2015-02-10 LAB — TYPE AND SCREEN
ABO/RH(D): O POS
Antibody Screen: POSITIVE
DAT, IgG: NEGATIVE

## 2015-02-10 LAB — COMPREHENSIVE METABOLIC PANEL
ALT: 9 U/L — ABNORMAL LOW (ref 14–54)
ANION GAP: 13 (ref 5–15)
AST: 22 U/L (ref 15–41)
Albumin: 3.1 g/dL — ABNORMAL LOW (ref 3.5–5.0)
Alkaline Phosphatase: 65 U/L (ref 38–126)
CHLORIDE: 94 mmol/L — AB (ref 101–111)
CO2: 30 mmol/L (ref 22–32)
Calcium: 9.4 mg/dL (ref 8.9–10.3)
Creatinine, Ser: 3.56 mg/dL — ABNORMAL HIGH (ref 0.44–1.00)
GFR calc Af Amer: 19 mL/min — ABNORMAL LOW (ref >60)
GFR calc non Af Amer: 16 mL/min — ABNORMAL LOW (ref >60)
GLUCOSE: 93 mg/dL (ref 65–99)
Potassium: 2.8 mmol/L — ABNORMAL LOW (ref 3.5–5.1)
SODIUM: 137 mmol/L (ref 135–145)
Total Bilirubin: 0.9 mg/dL (ref 0.3–1.2)
Total Protein: 8.7 g/dL — ABNORMAL HIGH (ref 6.5–8.1)

## 2015-02-10 NOTE — ED Notes (Signed)
Pt reports to the ED for eval of abnormal lab. She reports she received a call today and was told her Hb is 6.5 mg/dl. Pt denies any weakness, SOB, or CP. Also denies any vaginal or rectal bleeding. She is a dialysis patient and had her last dialysis today. Pt A&Ox4, resp e/u, and skin warm and dry. Denies complaints.

## 2015-02-10 NOTE — ED Provider Notes (Signed)
CSN: [id number]     Arrival date & time 02/10/15  1643 History   First MD Initiated Contact with Patient 02/10/15 1908     Chief Complaint  Patient presents with  . Abnormal Lab     (Consider location/radiation/quality/duration/timing/severity/associated sxs/prior Treatment) Patient is a 29 y.o. female presenting with general illness.  Illness Location:  Diffuse Quality:  Abnormal hemoglobin Severity:  Moderate Onset quality:  Gradual Duration:  2 days Timing:  Constant Progression:  Worsening Chronicity:  Chronic Associated symptoms: no fever and no shortness of breath     Past Medical History  Diagnosis Date  . Hypertension   . History of kidney transplant 2012    kidney failure due to hypertension  . Chronic kidney disease     previous hx dialysis  . Dialysis patient    Past Surgical History  Procedure Laterality Date  . Kidney transplant Bilateral 2012   No family history on file. Social History  Substance Use Topics  . Smoking status: Never Smoker   . Smokeless tobacco: Never Used  . Alcohol Use: No   OB History    Gravida Para Term Preterm AB TAB SAB Ectopic Multiple Living   1    1  1    0     Review of Systems  Constitutional: Negative for fever.  Respiratory: Negative for shortness of breath.   Neurological: Negative for dizziness and weakness.  All other systems reviewed and are negative.     Allergies  Review of patient's allergies indicates no known allergies.  Home Medications   Prior to Admission medications   Medication Sig Start Date End Date Taking? Authorizing Provider  amLODipine (NORVASC) 10 MG tablet Take 10 mg by mouth daily.   Yes Historical Provider, MD  ibuprofen (ADVIL,MOTRIN) 200 MG tablet Take 200 mg by mouth every 6 (six) hours as needed for moderate pain.    Yes Historical Provider, MD  labetalol (NORMODYNE) 300 MG tablet Take 300 mg by mouth 3 (three) times daily.   Yes Historical Provider, MD  medroxyPROGESTERone  (DEPO-PROVERA) 150 MG/ML injection Inject 1 mL (150 mg total) into the muscle every 3 (three) months. 12/06/14  Yes Shelly Bombard, MD  mycophenolate (MYFORTIC) 360 MG TBEC EC tablet Take 720 mg by mouth 2 (two) times daily.   Yes Historical Provider, MD  predniSONE (DELTASONE) 5 MG tablet Take 5 mg by mouth daily with breakfast.   Yes Historical Provider, MD  Prenat w/o A Vit-FeFum-FePo-FA (CONCEPT OB) 130-92.4-1 MG CAPS Take 1 capsule by mouth daily. 12/06/14  Yes Shelly Bombard, MD  tacrolimus (PROGRAF) 1 MG capsule Take 3 mg by mouth 2 (two) times daily.    Yes Historical Provider, MD   BP 183/118 mmHg  Pulse 92  Temp(Src) 98.4 F (36.9 C) (Oral)  Resp 16  SpO2 97%  LMP 01/06/2015 (Approximate) Physical Exam  Constitutional: She is oriented to person, place, and time. She appears well-developed and well-nourished. No distress.  HENT:  Head: Normocephalic and atraumatic.  Mouth/Throat: Oropharynx is clear and moist.  Eyes: EOM are normal. Pupils are equal, round, and reactive to light.  Neck: Normal range of motion. Neck supple.  Cardiovascular: Normal rate and regular rhythm.  Exam reveals no friction rub.   No murmur heard. Pulmonary/Chest: Effort normal and breath sounds normal. No respiratory distress. She has no wheezes. She has no rales.  Abdominal: Soft. She exhibits no distension. There is no tenderness. There is no rebound.  Musculoskeletal: Normal range of  motion. She exhibits no edema.  Neurological: She is alert and oriented to person, place, and time.  Skin: No rash noted. She is not diaphoretic.  Nursing note and vitals reviewed.   ED Course  Procedures (including critical care time) Labs Review Labs Reviewed  COMPREHENSIVE METABOLIC PANEL - Abnormal; Notable for the following:    Potassium 2.8 (*)    Chloride 94 (*)    BUN <5 (*)    Creatinine, Ser 3.56 (*)    Total Protein 8.7 (*)    Albumin 3.1 (*)    ALT 9 (*)    GFR calc non Af Amer 16 (*)    GFR  calc Af Amer 19 (*)    All other components within normal limits  CBC - Abnormal; Notable for the following:    RBC 3.00 (*)    Hemoglobin 7.2 (*)    HCT 23.5 (*)    MCH 24.0 (*)    RDW 23.7 (*)    All other components within normal limits  TYPE AND SCREEN    Imaging Review No results found. I have personally reviewed and evaluated these images and lab results as part of my medical decision-making.   EKG Interpretation None      MDM   Final diagnoses:  Anemia of chronic disease    29 year old female with history of anemia of chronic disease with hemoglobin ranges between 678 presents with anemia. Labs on today's ago showed hemoglobin of 6.5. Today's hemoglobin 7.2. She denies any dizziness, shortness of breath, fatigue. She states she feels well. I spoke with internal medicine who states her hemoglobin is normally in this area. I do not feel she needs to transfusion. Instructed to follow-up with dialysis. She does have IV iron transfusions and is on some other medicines for anemia. Patient is comfortable with this plan. Stable for discharge.  Evelina Bucy, MD 02/11/15 [phone]

## 2015-02-10 NOTE — Discharge Instructions (Signed)
Anemia, Nonspecific Anemia is a condition in which the concentration of red blood cells or hemoglobin in the blood is below normal. Hemoglobin is a substance in red blood cells that carries oxygen to the tissues of the body. Anemia results in not enough oxygen reaching these tissues.  CAUSES  Common causes of anemia include:   Excessive bleeding. Bleeding may be internal or external. This includes excessive bleeding from periods (in women) or from the intestine.   Poor nutrition.   Chronic kidney, thyroid, and liver disease.  Bone marrow disorders that decrease red blood cell production.  Cancer and treatments for cancer.  HIV, AIDS, and their treatments.  Spleen problems that increase red blood cell destruction.  Blood disorders.  Excess destruction of red blood cells due to infection, medicines, and autoimmune disorders. SIGNS AND SYMPTOMS   Minor weakness.   Dizziness.   Headache.  Palpitations.   Shortness of breath, especially with exercise.   Paleness.  Cold sensitivity.  Indigestion.  Nausea.  Difficulty sleeping.  Difficulty concentrating. Symptoms may occur suddenly or they may develop slowly.  DIAGNOSIS  Additional blood tests are often needed. These help your health care provider determine the best treatment. Your health care provider will check your stool for blood and look for other causes of blood loss.  TREATMENT  Treatment varies depending on the cause of the anemia. Treatment can include:   Supplements of iron, vitamin B12, or folic acid.   Hormone medicines.   A blood transfusion. This may be needed if blood loss is severe.   Hospitalization. This may be needed if there is significant continual blood loss.   Dietary changes.  Spleen removal. HOME CARE INSTRUCTIONS Keep all follow-up appointments. It often takes many weeks to correct anemia, and having your health care provider check on your condition and your response to  treatment is very important. SEEK IMMEDIATE MEDICAL CARE IF:   You develop extreme weakness, shortness of breath, or chest pain.   You become dizzy or have trouble concentrating.  You develop heavy vaginal bleeding.   You develop a rash.   You have bloody or black, tarry stools.   You faint.   You vomit up blood.   You vomit repeatedly.   You have abdominal pain.  You have a fever or persistent symptoms for more than 2-3 days.   You have a fever and your symptoms suddenly get worse.   You are dehydrated.  MAKE SURE YOU:  Understand these instructions.  Will watch your condition.  Will get help right away if you are not doing well or get worse. Document Released: 07/18/2004 Document Revised: 02/10/2013 Document Reviewed: 12/04/2012 [employer] Patient Information 2015 [employer]. This information is not intended to replace advice given to you by your health care provider. Make sure you discuss any questions you have with your health care provider.  

## 2015-02-13 DIAGNOSIS — D631 Anemia in chronic kidney disease: Secondary | ICD-10-CM | POA: Diagnosis not present

## 2015-02-13 DIAGNOSIS — N2581 Secondary hyperparathyroidism of renal origin: Secondary | ICD-10-CM | POA: Diagnosis not present

## 2015-02-13 DIAGNOSIS — D509 Iron deficiency anemia, unspecified: Secondary | ICD-10-CM | POA: Diagnosis not present

## 2015-02-13 DIAGNOSIS — N186 End stage renal disease: Secondary | ICD-10-CM | POA: Diagnosis not present

## 2015-02-15 ENCOUNTER — Telehealth (HOSPITAL_COMMUNITY): Payer: Self-pay | Admitting: Hematology

## 2015-02-15 ENCOUNTER — Telehealth: Payer: Self-pay | Admitting: Hematology

## 2015-02-15 DIAGNOSIS — D509 Iron deficiency anemia, unspecified: Secondary | ICD-10-CM | POA: Diagnosis not present

## 2015-02-15 DIAGNOSIS — N186 End stage renal disease: Secondary | ICD-10-CM | POA: Diagnosis not present

## 2015-02-15 DIAGNOSIS — D631 Anemia in chronic kidney disease: Secondary | ICD-10-CM | POA: Diagnosis not present

## 2015-02-15 DIAGNOSIS — N2581 Secondary hyperparathyroidism of renal origin: Secondary | ICD-10-CM | POA: Diagnosis not present

## 2015-02-15 NOTE — Telephone Encounter (Signed)
Left another message that I was calling to schedule her blood transfusion.  Contact information left for patient to return my call.

## 2015-02-15 NOTE — Telephone Encounter (Signed)
Left message for patient advising we recvd orders for blood transfusion and needed to get it scheduled.  Left name and call back number on voicemail.

## 2015-02-15 NOTE — Telephone Encounter (Signed)
Patient returned called and stated she couldn't come today but could come tomorrow for blood transfusion.  Appointment made for 02/16/2015 @ 9:00.  Process explained, address and directions given.  Patient verbalizes understanding.

## 2015-02-16 ENCOUNTER — Other Ambulatory Visit: Payer: Self-pay

## 2015-02-16 ENCOUNTER — Ambulatory Visit (HOSPITAL_COMMUNITY)
Admission: RE | Admit: 2015-02-16 | Discharge: 2015-02-16 | Disposition: A | Discharge status: Home / Self Care | Payer: Medicare Other | Source: Ambulatory Visit | Attending: Nephrology | Admitting: Nephrology

## 2015-02-16 ENCOUNTER — Emergency Department (HOSPITAL_COMMUNITY): Payer: Medicare Other

## 2015-02-16 ENCOUNTER — Observation Stay (HOSPITAL_COMMUNITY)
Admission: EM | Admit: 2015-02-16 | Discharge: 2015-02-18 | Disposition: A | Discharge status: Home / Self Care | Payer: Medicare Other | Attending: Student in an Organized Health Care Education/Training Program | Admitting: Student in an Organized Health Care Education/Training Program

## 2015-02-16 ENCOUNTER — Encounter (HOSPITAL_COMMUNITY): Payer: Self-pay | Admitting: Emergency Medicine

## 2015-02-16 DIAGNOSIS — D649 Anemia, unspecified: Secondary | ICD-10-CM | POA: Insufficient documentation

## 2015-02-16 DIAGNOSIS — Z791 Long term (current) use of non-steroidal anti-inflammatories (NSAID): Secondary | ICD-10-CM | POA: Diagnosis not present

## 2015-02-16 DIAGNOSIS — Z79899 Other long term (current) drug therapy: Secondary | ICD-10-CM | POA: Insufficient documentation

## 2015-02-16 DIAGNOSIS — I10 Essential (primary) hypertension: Secondary | ICD-10-CM | POA: Diagnosis present

## 2015-02-16 DIAGNOSIS — E876 Hypokalemia: Secondary | ICD-10-CM | POA: Diagnosis not present

## 2015-02-16 DIAGNOSIS — Z940 Kidney transplant status: Secondary | ICD-10-CM | POA: Diagnosis not present

## 2015-02-16 DIAGNOSIS — J90 Pleural effusion, not elsewhere classified: Secondary | ICD-10-CM | POA: Diagnosis not present

## 2015-02-16 DIAGNOSIS — Z992 Dependence on renal dialysis: Secondary | ICD-10-CM | POA: Diagnosis not present

## 2015-02-16 DIAGNOSIS — N2581 Secondary hyperparathyroidism of renal origin: Secondary | ICD-10-CM | POA: Diagnosis not present

## 2015-02-16 DIAGNOSIS — N186 End stage renal disease: Secondary | ICD-10-CM | POA: Insufficient documentation

## 2015-02-16 DIAGNOSIS — I501 Left ventricular failure: Secondary | ICD-10-CM | POA: Diagnosis not present

## 2015-02-16 DIAGNOSIS — Z01818 Encounter for other preprocedural examination: Secondary | ICD-10-CM | POA: Diagnosis not present

## 2015-02-16 DIAGNOSIS — I160 Hypertensive urgency: Secondary | ICD-10-CM | POA: Diagnosis present

## 2015-02-16 DIAGNOSIS — N189 Chronic kidney disease, unspecified: Secondary | ICD-10-CM

## 2015-02-16 DIAGNOSIS — R5383 Other fatigue: Secondary | ICD-10-CM | POA: Diagnosis not present

## 2015-02-16 DIAGNOSIS — I120 Hypertensive chronic kidney disease with stage 5 chronic kidney disease or end stage renal disease: Principal | ICD-10-CM | POA: Insufficient documentation

## 2015-02-16 DIAGNOSIS — D631 Anemia in chronic kidney disease: Secondary | ICD-10-CM | POA: Diagnosis not present

## 2015-02-16 LAB — CBC WITH DIFFERENTIAL/PLATELET
BASOS ABS: 0 10*3/uL (ref 0.0–0.1)
BASOS PCT: 0 % (ref 0–1)
EOS ABS: 0.5 10*3/uL (ref 0.0–0.7)
Eosinophils Relative: 5 % (ref 0–5)
HEMATOCRIT: 23.5 % — AB (ref 36.0–46.0)
HEMOGLOBIN: 7 g/dL — AB (ref 12.0–15.0)
LYMPHS PCT: 22 % (ref 12–46)
Lymphs Abs: 2 10*3/uL (ref 0.7–4.0)
MCH: 24.1 pg — ABNORMAL LOW (ref 26.0–34.0)
MCHC: 29.8 g/dL — AB (ref 30.0–36.0)
MCV: 80.8 fL (ref 78.0–100.0)
MONOS PCT: 6 % (ref 3–12)
Monocytes Absolute: 0.6 10*3/uL (ref 0.1–1.0)
NEUTROS ABS: 6.1 10*3/uL (ref 1.7–7.7)
NEUTROS PCT: 67 % (ref 43–77)
Platelets: 278 10*3/uL (ref 150–400)
RBC: 2.91 MIL/uL — ABNORMAL LOW (ref 3.87–5.11)
RDW: 23.6 % — ABNORMAL HIGH (ref 11.5–15.5)
WBC: 9.2 10*3/uL (ref 4.0–10.5)

## 2015-02-16 LAB — COMPREHENSIVE METABOLIC PANEL
ALBUMIN: 3.3 g/dL — AB (ref 3.5–5.0)
ALT: 10 U/L — ABNORMAL LOW (ref 14–54)
ANION GAP: 14 (ref 5–15)
AST: 24 U/L (ref 15–41)
Alkaline Phosphatase: 69 U/L (ref 38–126)
BUN: 6 mg/dL (ref 6–20)
CO2: 30 mmol/L (ref 22–32)
Calcium: 9.9 mg/dL (ref 8.9–10.3)
Chloride: 92 mmol/L — ABNORMAL LOW (ref 101–111)
Creatinine, Ser: 5.53 mg/dL — ABNORMAL HIGH (ref 0.44–1.00)
GFR calc non Af Amer: 10 mL/min — ABNORMAL LOW (ref >60)
GFR, EST AFRICAN AMERICAN: 11 mL/min — AB (ref >60)
GLUCOSE: 78 mg/dL (ref 65–99)
POTASSIUM: 2.9 mmol/L — AB (ref 3.5–5.1)
SODIUM: 136 mmol/L (ref 135–145)
Total Bilirubin: 0.8 mg/dL (ref 0.3–1.2)
Total Protein: 8.6 g/dL — ABNORMAL HIGH (ref 6.5–8.1)

## 2015-02-16 LAB — I-STAT TROPONIN, ED: TROPONIN I, POC: 0.04 ng/mL (ref 0.00–0.08)

## 2015-02-16 LAB — PREPARE RBC (CROSSMATCH)

## 2015-02-16 MED ORDER — AMLODIPINE BESYLATE 10 MG PO TABS
10.0000 mg | ORAL_TABLET | Freq: Every day | ORAL | Status: DC
  Administered 2015-02-17 – 2015-02-18 (×2): 10 mg via ORAL
  Filled 2015-02-16 (×2): qty 1

## 2015-02-16 MED ORDER — LABETALOL HCL 5 MG/ML IV SOLN: 10.0000 mg | INTRAVENOUS | Status: DC | PRN

## 2015-02-16 MED ORDER — SODIUM CHLORIDE 0.9 % IV SOLN
Freq: Once | INTRAVENOUS | Status: AC
  Administered 2015-02-16: via INTRAVENOUS

## 2015-02-16 MED ORDER — SODIUM CHLORIDE 0.9 % IJ SOLN
3.0000 mL | Freq: Two times a day (BID) | INTRAMUSCULAR | Status: DC
  Administered 2015-02-18: 3 mL via INTRAVENOUS

## 2015-02-16 MED ORDER — CLONIDINE HCL 0.1 MG PO TABS
0.1000 mg | ORAL_TABLET | Freq: Once | ORAL | Status: AC
  Administered 2015-02-16: 0.1 mg via ORAL
  Filled 2015-02-16: qty 1

## 2015-02-16 MED ORDER — INFLUENZA VAC SPLIT QUAD 0.5 ML IM SUSY
0.5000 mL | PREFILLED_SYRINGE | INTRAMUSCULAR | Status: DC
Start: 2015-02-17 — End: 2015-02-18
  Filled 2015-02-16: qty 0.5

## 2015-02-16 MED ORDER — NICARDIPINE HCL IN NACL 20-0.86 MG/200ML-% IV SOLN
3.0000 mg/h | Freq: Once | INTRAVENOUS | Status: AC
  Administered 2015-02-16: 3 mg/h via INTRAVENOUS
  Filled 2015-02-16: qty 200

## 2015-02-16 MED ORDER — PREDNISONE 10 MG PO TABS
5.0000 mg | ORAL_TABLET | Freq: Every day | ORAL | Status: DC
  Administered 2015-02-17 – 2015-02-18 (×2): 5 mg via ORAL
  Filled 2015-02-16 (×3): qty 1

## 2015-02-16 MED ORDER — SODIUM CHLORIDE 0.9 % IJ SOLN
3.0000 mL | Freq: Two times a day (BID) | INTRAMUSCULAR | Status: DC
  Administered 2015-02-17 (×2): 3 mL via INTRAVENOUS

## 2015-02-16 MED ORDER — LABETALOL HCL 200 MG PO TABS
300.0000 mg | ORAL_TABLET | Freq: Three times a day (TID) | ORAL | Status: DC
  Administered 2015-02-16 – 2015-02-17 (×2): 300 mg via ORAL
  Filled 2015-02-16 (×2): qty 2

## 2015-02-16 MED ORDER — SODIUM CHLORIDE 0.9 % IV SOLN: Freq: Once | INTRAVENOUS | Status: DC

## 2015-02-16 MED ORDER — HEPARIN SODIUM (PORCINE) 5000 UNIT/ML IJ SOLN: 5000.0000 [IU] | Freq: Three times a day (TID) | INTRAMUSCULAR | Status: DC

## 2015-02-16 MED ORDER — MYCOPHENOLATE SODIUM 180 MG PO TBEC
720.0000 mg | DELAYED_RELEASE_TABLET | Freq: Two times a day (BID) | ORAL | Status: DC
  Administered 2015-02-16 – 2015-02-18 (×4): 720 mg via ORAL
  Filled 2015-02-16 (×5): qty 4

## 2015-02-16 MED ORDER — TACROLIMUS 1 MG PO CAPS
3.0000 mg | ORAL_CAPSULE | Freq: Two times a day (BID) | ORAL | Status: DC
  Administered 2015-02-16 – 2015-02-18 (×4): 3 mg via ORAL
  Filled 2015-02-16 (×5): qty 3

## 2015-02-16 NOTE — Progress Notes (Signed)
Pt informed that dialysis center would contact her with further instructions about transfusion; pt verbalizes understanding; pt alert, oriented, ambulatory upon discharge and accompanied by family member

## 2015-02-16 NOTE — ED Provider Notes (Signed)
CSN: [id number]     Arrival date & time 02/16/15  1414 History   First MD Initiated Contact with Patient 02/16/15 1443     Chief Complaint  Patient presents with  . Anemia     (Consider location/radiation/quality/duration/timing/severity/associated sxs/prior Treatment) HPI Comments: 29 year old female past medical history hypertension, chronic kidney disease on dialysis presenting from short stay for blood transfusion. Patient was told yesterday by dialysis that she had hemoglobin of 6.3 and required transfusion. Today when she went to get the transfusion, her blood pressure was elevated and he did not want to complete dialysis. Had not taken her labetalol prior to going for transfusion, took labetalol and was given 0.1 mg clonidine without significant change. Patient was sent to the ED by Dr. Mercy Moore for blood pressure management and transfusion. Last night patient started feeling short of breath both at rest and on exertion with a nonproductive cough. Denies chest pain or fever. Completed dialysis yesterday, dialyzes Monday, Wednesday and Friday. This will only be her second blood transfusion. Denies vaginal bleeding or rectal bleeding. History of chronic anemia. Denies HA, vision changes, dizziness, lightheadedness.  Patient is a 29 y.o. female presenting with anemia. The history is provided by the patient and medical records.  Anemia Associated symptoms include coughing and fatigue.    Past Medical History  Diagnosis Date  . Hypertension   . History of kidney transplant 2012    kidney failure due to hypertension  . Chronic kidney disease     previous hx dialysis  . Dialysis patient    Past Surgical History  Procedure Laterality Date  . Kidney transplant Bilateral 2012   History reviewed. No pertinent family history. Social History  Substance Use Topics  . Smoking status: Never Smoker   . Smokeless tobacco: Never Used  . Alcohol Use: No   OB History    Gravida Para Term  Preterm AB TAB SAB Ectopic Multiple Living   1    1  1    0     Review of Systems  Constitutional: Positive for fatigue.  Respiratory: Positive for cough and shortness of breath.   All other systems reviewed and are negative.     Allergies  Review of patient's allergies indicates no known allergies.  Home Medications   Prior to Admission medications   Medication Sig Start Date End Date Taking? Authorizing Provider  amLODipine (NORVASC) 10 MG tablet Take 10 mg by mouth daily.   Yes Historical Provider, MD  ibuprofen (ADVIL,MOTRIN) 200 MG tablet Take 200 mg by mouth every 6 (six) hours as needed for moderate pain.    Yes Historical Provider, MD  labetalol (NORMODYNE) 300 MG tablet Take 300 mg by mouth 3 (three) times daily.   Yes Historical Provider, MD  medroxyPROGESTERone (DEPO-PROVERA) 150 MG/ML injection Inject 1 mL (150 mg total) into the muscle every 3 (three) months. 12/06/14  Yes Shelly Bombard, MD  mycophenolate (MYFORTIC) 360 MG TBEC EC tablet Take 720 mg by mouth 2 (two) times daily.   Yes Historical Provider, MD  predniSONE (DELTASONE) 5 MG tablet Take 5 mg by mouth daily with breakfast.   Yes Historical Provider, MD  tacrolimus (PROGRAF) 1 MG capsule Take 3 mg by mouth 2 (two) times daily.    Yes Historical Provider, MD  Prenat w/o A Vit-FeFum-FePo-FA (CONCEPT OB) 130-92.4-1 MG CAPS Take 1 capsule by mouth daily. Patient not taking: Reported on 02/16/2015 12/06/14   Shelly Bombard, MD   BP 190/128 mmHg  Pulse 89  Temp(Src) 97.8 F (36.6 C) (Oral)  Resp 13  SpO2 100%  LMP 01/06/2015 Physical Exam  Constitutional: She is oriented to person, place, and time. She appears well-developed and well-nourished. No distress.  HENT:  Head: Normocephalic and atraumatic.  Mouth/Throat: Oropharynx is clear and moist.  Eyes: Conjunctivae and EOM are normal.  Neck: Normal range of motion. Neck supple.  Cardiovascular: Regular rhythm and normal heart sounds.   Mildly tachy.  Thrill in AV fistula in L forearm. No extremity edema.  Pulmonary/Chest: Effort normal and breath sounds normal. No respiratory distress.  Musculoskeletal: Normal range of motion. She exhibits no edema.  Neurological: She is alert and oriented to person, place, and time. No sensory deficit.  Skin: Skin is warm and dry.  Psychiatric: She has a normal mood and affect. Her behavior is normal.  Nursing note and vitals reviewed.   ED Course  Procedures (including critical care time) Labs Review Labs Reviewed  COMPREHENSIVE METABOLIC PANEL - Abnormal; Notable for the following:    Potassium 2.9 (*)    Chloride 92 (*)    Creatinine, Ser 5.53 (*)    Total Protein 8.6 (*)    Albumin 3.3 (*)    ALT 10 (*)    GFR calc non Af Amer 10 (*)    GFR calc Af Amer 11 (*)    All other components within normal limits  CBC WITH DIFFERENTIAL/PLATELET - Abnormal; Notable for the following:    RBC 2.91 (*)    Hemoglobin 7.0 (*)    HCT 23.5 (*)    MCH 24.1 (*)    MCHC 29.8 (*)    RDW 23.6 (*)    All other components within normal limits  I-STAT TROPOININ, ED  TYPE AND SCREEN    Imaging Review Dg Chest 2 View  02/16/2015   CLINICAL DATA:  Dialysis patient, no acute symptoms, pre blood transfusion exam.  EXAM: CHEST  2 VIEW  COMPARISON:  PA and lateral chest x-ray of February 01, 2015  FINDINGS: The cardiac silhouette is mildly enlarged. The pulmonary vascularity is prominent centrally with mild cephalization noted. There is mild pulmonary interstitial edema. There is a trace of pleural fluid blunting the costophrenic angles. The bony thorax exhibits no acute abnormality.  IMPRESSION: CHF with mild pulmonary interstitial edema and trace bilateral pleural effusions. There is no acute bony abnormality.   Electronically Signed   By: David  Martinique M.D.   On: 02/16/2015 15:32   I have personally reviewed and evaluated these images and lab results as part of my medical decision-making.   EKG  Interpretation None      MDM   Final diagnoses:  None   Non-toxic appearing, NAD. Afebrile. Hypertensive, vitals otherwise stable. Hemoglobin today 7.0 which is a 2 point drop over 2 weeks. Given a second dose of 0.1 mg clonidine with no significant change in her blood pressure. Will start IV cardene as per Dr. Lacinda Axon. Chest x-ray showing CHF with mild pulmonary interstitial edema and trace bilateral pleural effusions. I spoke with nephrologist on call Dr. Jonnie Finner who suggests admission for BP control before transfusion. Will admit. Admission accepted by Dr. Antionette Poles, attending Dr. Evette Doffing.  Pt seen by Dr. Lacinda Axon, agrees with plan.  Carman Ching, PA-C 02/16/15 1651  Nat Christen, MD 02/16/15 [phone]

## 2015-02-16 NOTE — Progress Notes (Addendum)
Pt BP 202/118 post administration of clonidine 0.1mg  PO; per MD, pt will not receive blood transfusion today; pt advised to check with MD and dialysis center; dialysis center notified; pt verbalizes understanding; pt advised to leave blood band on because it is good for 72 hours

## 2015-02-16 NOTE — ED Notes (Signed)
PT was sent for hbg of 6.4. Been getting dialysis without issue but was told hbg keeps dropping so needs transfusion. Hypertensive.

## 2015-02-16 NOTE — H&P (Signed)
Date: 02/16/2015               Patient Name:  Kerry Cook MRN: [id number]  DOB: 1985-12-17 Age / Sex: 29 y.o., female   PCP: No Pcp Per Patient         Medical Service: Internal Medicine Teaching Service         Attending Physician: Dr. Axel Filler, MD    First Contact: Dr. [name] Pager: [phone]  Second Contact: Dr. 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 At her dialysis session on 8/24, she was noted to have a HgB of 6.3. At her short stay for transfusion, she was noted to be hypertensive and give 0.1 mg clonidine without significant change. According to the patient, she had not taken her labetalol for 2 days, but she had been taking her amlodipine. She denies any vaginal or rectal bleeding. She denies chills, chest pain, syncope, headache, nausea, vomiting, or dysuria.  In the ED, her HgB was 7.0, a two point drop over 2 weeks, and was noted to have interstitial edema with bilateral pleural effusions on CXR. IV Cardene was started in the ED. She was admitted for control of her hypertension prior to transfusion.    Meds: Current Facility-Administered Medications  Medication Dose Route Frequency Provider Last Rate Last Dose  . [START ON 02/17/2015] amLODipine (NORVASC) tablet 10 mg  10 mg Oral Daily Francesca Oman, DO      . heparin injection 5,000 Units  5,000 Units Subcutaneous 3 times per day Francesca Oman, DO      . labetalol (NORMODYNE) tablet 300 mg  300 mg Oral TID Francesca Oman, DO      . mycophenolate (MYFORTIC) EC tablet 720 mg  720 mg Oral BID Francesca Oman, DO      . Derrill Memo ON 02/17/2015]  predniSONE (DELTASONE) tablet 5 mg  5 mg Oral Q breakfast Francesca Oman, DO      . sodium chloride 0.9 % injection 3 mL  3 mL Intravenous Q12H Francesca Oman, DO      . sodium chloride 0.9 % injection 3 mL  3 mL Intravenous Q12H Francesca Oman, DO      . tacrolimus (PROGRAF) capsule 3 mg  3 mg Oral BID Francesca Oman, DO       Facility-Administered Medications Ordered in Other Encounters  Medication Dose Route Frequency Provider Last Rate Last Dose  . 0.9 %  sodium chloride infusion   Intravenous Once Fleet Contras, MD        Allergies: Allergies as of 02/16/2015  . (No Known Allergies)   Past Medical History  Diagnosis Date  . Hypertension   . History of kidney transplant 2012    kidney failure due to hypertension  . Chronic kidney disease     previous hx dialysis  . Dialysis patient    Past Surgical History  Procedure Laterality Date  . Kidney transplant Bilateral 2012   History reviewed. No pertinent family history. Social History   Social History  . Marital Status: Single    Spouse Name: N/A  .  Number of Children: N/A  . Years of Education: N/A   Occupational History  . Not on file.   Social History Main Topics  . Smoking status: Never Smoker   . Smokeless tobacco: Never Used  . Alcohol Use: No  . Drug Use: No  . Sexual Activity:    Partners: Male    Birth Control/ Protection: Condom, None   Other Topics Concern  . Not on file   Social History Narrative    Review of Systems: Negative Except per Above  Physical Exam: Blood pressure 144/92, pulse 95, temperature 98.1 F (36.7 C), temperature source Oral, resp. rate 23, height 5' (1.524 m), weight 129 lb 13.6 oz (58.9 kg), last menstrual period 01/06/2015, SpO2 100 %, unknown if currently breastfeeding.  General:  Lying in bed in no acute distress HEENT: PERRL. EOMI. Moist mucous membranes. No thrush or tonsillar erythema Cardiac:  RRR without murmus. Normal S1, S2. Palpable thrill at AV fistula in R  anterior forearm Pulmonary:  Lungs clear to ausculation without wheezes or crackles Extremities: No clubbing, cyanosis, or edem  Lab results: Basic Metabolic Panel:  Recent Labs  02/16/15 1440  NA 136  K 2.9*  CL 92*  CO2 30  GLUCOSE 78  BUN 6  CREATININE 5.53*  CALCIUM 9.9   Liver Function Tests:  Recent Labs  02/16/15 1440  AST 24  ALT 10*  ALKPHOS 69  BILITOT 0.8  PROT 8.6*  ALBUMIN 3.3*   CBC:  Recent Labs  02/16/15 1440  WBC 9.2  NEUTROABS 6.1  HGB 7.0*  HCT 23.5*  MCV 80.8  PLT 278   Imaging results:  Dg Chest 2 View  02/16/2015   CLINICAL DATA:  Dialysis patient, no acute symptoms, pre blood transfusion exam.  EXAM: CHEST  2 VIEW  COMPARISON:  PA and lateral chest x-ray of February 01, 2015  FINDINGS: The cardiac silhouette is mildly enlarged. The pulmonary vascularity is prominent centrally with mild cephalization noted. There is mild pulmonary interstitial edema. There is a trace of pleural fluid blunting the costophrenic angles. The bony thorax exhibits no acute abnormality.  IMPRESSION: CHF with mild pulmonary interstitial edema and trace bilateral pleural effusions. There is no acute bony abnormality.   Electronically Signed   By: David  Martinique M.D.   On: 02/16/2015 15:32    Assessment & Plan by Problem:  Hypertensive Urgency:  Likely due to missed doses of labetalol. She Currently at 139/92 from peak of 212/122.  - Transitioned from Cardene drip to labetalol 300 mg TID and amlodipine 10 mg daily - Labetalol 10 mg IV q2h prn for BPs >170/100  Anemia of Chronic Disease:  At 7.0 in the ED. Likely related to renal failure. Will receive 1 unit pRBCs once hypertension has been controlled.  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 Anticipated discharge in approximately `1-2 day(s).   The patient does not have a current PCP (No Pcp Per Patient) and does not need an [facility] hospital follow-up appointment after discharge.  The patient does not have transportation limitations that hinder transportation to clinic appointments.  Signed: Liberty Handy, MD 02/16/2015, 7:48 PM

## 2015-02-16 NOTE — Progress Notes (Signed)
Pt states feeling SOB; vital signs obtained and BP is 212/122; Dr. Mercy Moore notified; pt states did not take AM dose of labetolol; orders received for one PO dose of clonidine; order to infuse 1 unit PRBCs if pt's BP < [id number] systolic and one unit tomorrow; if not [id number] systolic, do not infuse and pt will be rescheduled; verbal order noted in chart under care order/instruction; will continue to monitor

## 2015-02-17 DIAGNOSIS — N186 End stage renal disease: Secondary | ICD-10-CM | POA: Diagnosis not present

## 2015-02-17 DIAGNOSIS — Z992 Dependence on renal dialysis: Secondary | ICD-10-CM | POA: Diagnosis not present

## 2015-02-17 DIAGNOSIS — I120 Hypertensive chronic kidney disease with stage 5 chronic kidney disease or end stage renal disease: Secondary | ICD-10-CM | POA: Diagnosis not present

## 2015-02-17 DIAGNOSIS — T8611 Kidney transplant rejection: Secondary | ICD-10-CM

## 2015-02-17 DIAGNOSIS — D638 Anemia in other chronic diseases classified elsewhere: Secondary | ICD-10-CM

## 2015-02-17 DIAGNOSIS — E876 Hypokalemia: Secondary | ICD-10-CM | POA: Diagnosis not present

## 2015-02-17 LAB — CBC
HCT: 23.6 % — ABNORMAL LOW (ref 36.0–46.0)
Hemoglobin: 7.2 g/dL — ABNORMAL LOW (ref 12.0–15.0)
MCH: 24.2 pg — AB (ref 26.0–34.0)
MCHC: 30.5 g/dL (ref 30.0–36.0)
MCV: 79.2 fL (ref 78.0–100.0)
PLATELETS: 272 10*3/uL (ref 150–400)
RBC: 2.98 MIL/uL — ABNORMAL LOW (ref 3.87–5.11)
RDW: 22 % — AB (ref 11.5–15.5)
WBC: 8 10*3/uL (ref 4.0–10.5)

## 2015-02-17 LAB — RENAL FUNCTION PANEL
Albumin: 2.6 g/dL — ABNORMAL LOW (ref 3.5–5.0)
Anion gap: 11 (ref 5–15)
BUN: 10 mg/dL (ref 6–20)
CHLORIDE: 94 mmol/L — AB (ref 101–111)
CO2: 31 mmol/L (ref 22–32)
CREATININE: 7.08 mg/dL — AB (ref 0.44–1.00)
Calcium: 9.2 mg/dL (ref 8.9–10.3)
GFR calc Af Amer: 8 mL/min — ABNORMAL LOW (ref >60)
GFR calc non Af Amer: 7 mL/min — ABNORMAL LOW (ref >60)
Glucose, Bld: 77 mg/dL (ref 65–99)
Phosphorus: 4.4 mg/dL (ref 2.5–4.6)
Potassium: 3 mmol/L — ABNORMAL LOW (ref 3.5–5.1)
Sodium: 136 mmol/L (ref 135–145)

## 2015-02-17 LAB — BASIC METABOLIC PANEL
Anion gap: 12 (ref 5–15)
BUN: 12 mg/dL (ref 6–20)
CO2: 29 mmol/L (ref 22–32)
Calcium: 9.4 mg/dL (ref 8.9–10.3)
Chloride: 94 mmol/L — ABNORMAL LOW (ref 101–111)
Creatinine, Ser: 8.09 mg/dL — ABNORMAL HIGH (ref 0.44–1.00)
GFR calc Af Amer: 7 mL/min — ABNORMAL LOW (ref >60)
GFR calc non Af Amer: 6 mL/min — ABNORMAL LOW (ref >60)
Glucose, Bld: 95 mg/dL (ref 65–99)
Potassium: 3.3 mmol/L — ABNORMAL LOW (ref 3.5–5.1)
Sodium: 135 mmol/L (ref 135–145)

## 2015-02-17 LAB — PREPARE RBC (CROSSMATCH)

## 2015-02-17 MED ORDER — SODIUM CHLORIDE 0.9 % IV SOLN
Freq: Once | INTRAVENOUS | Status: DC
Start: 2015-02-17 — End: 2015-02-18

## 2015-02-17 MED ORDER — LIDOCAINE HCL (PF) 1 % IJ SOLN: 5.0000 mL | INTRAMUSCULAR | Status: DC | PRN

## 2015-02-17 MED ORDER — NEPRO/CARBSTEADY PO LIQD: 237.0000 mL | ORAL | Status: DC | PRN

## 2015-02-17 MED ORDER — HEPARIN SODIUM (PORCINE) 1000 UNIT/ML DIALYSIS: 1000.0000 [IU] | INTRAMUSCULAR | Status: DC | PRN

## 2015-02-17 MED ORDER — HEPARIN SODIUM (PORCINE) 1000 UNIT/ML DIALYSIS
20.0000 [IU]/kg | INTRAMUSCULAR | Status: DC | PRN
  Administered 2015-02-17: 1200 [IU] via INTRAVENOUS_CENTRAL

## 2015-02-17 MED ORDER — SODIUM CHLORIDE 0.9 % IV SOLN: 100.0000 mL | INTRAVENOUS | Status: DC | PRN

## 2015-02-17 MED ORDER — SODIUM CHLORIDE 0.9 % IV SOLN
100.0000 mL | INTRAVENOUS | Status: DC | PRN
Start: 2015-02-17 — End: 2015-02-18

## 2015-02-17 MED ORDER — LABETALOL HCL 200 MG PO TABS
300.0000 mg | ORAL_TABLET | Freq: Every day | ORAL | Status: DC
  Administered 2015-02-18: 300 mg via ORAL
  Filled 2015-02-17 (×2): qty 2

## 2015-02-17 MED ORDER — LIDOCAINE-PRILOCAINE 2.5-2.5 % EX CREA
1.0000 "application " | TOPICAL_CREAM | CUTANEOUS | Status: DC | PRN
  Filled 2015-02-17: qty 5

## 2015-02-17 MED ORDER — DOXERCALCIFEROL 4 MCG/2ML IV SOLN
4.0000 ug | INTRAVENOUS | Status: DC
  Administered 2015-02-17: 4 ug via INTRAVENOUS
  Filled 2015-02-17: qty 2

## 2015-02-17 MED ORDER — POTASSIUM CHLORIDE CRYS ER 20 MEQ PO TBCR: 20.0000 meq | EXTENDED_RELEASE_TABLET | Freq: Two times a day (BID) | ORAL | Status: DC

## 2015-02-17 MED ORDER — DOXERCALCIFEROL 4 MCG/2ML IV SOLN
INTRAVENOUS | Status: AC
  Administered 2015-02-17: 4 ug via INTRAVENOUS
  Filled 2015-02-17: qty 2

## 2015-02-17 MED ORDER — RENA-VITE PO TABS
1.0000 | ORAL_TABLET | Freq: Every day | ORAL | Status: DC
  Administered 2015-02-17: 1 via ORAL
  Filled 2015-02-17: qty 1

## 2015-02-17 MED ORDER — SODIUM CHLORIDE 0.9 % IV SOLN
125.0000 mg | INTRAVENOUS | Status: DC
  Administered 2015-02-17: 125 mg via INTRAVENOUS
  Filled 2015-02-17: qty 10

## 2015-02-17 MED ORDER — LABETALOL HCL 5 MG/ML IV SOLN
10.0000 mg | INTRAVENOUS | Status: DC | PRN
  Administered 2015-02-17: 10 mg via INTRAVENOUS
  Filled 2015-02-17 (×2): qty 4

## 2015-02-17 MED ORDER — ALTEPLASE 2 MG IJ SOLR
2.0000 mg | Freq: Once | INTRAMUSCULAR | Status: DC | PRN
  Filled 2015-02-17: qty 2

## 2015-02-17 MED ORDER — PENTAFLUOROPROP-TETRAFLUOROETH EX AERO: 1.0000 "application " | INHALATION_SPRAY | CUTANEOUS | Status: DC | PRN

## 2015-02-17 MED ORDER — LABETALOL HCL 200 MG PO TABS
600.0000 mg | ORAL_TABLET | Freq: Two times a day (BID) | ORAL | Status: DC
  Administered 2015-02-17: 600 mg via ORAL
  Filled 2015-02-17 (×2): qty 3

## 2015-02-17 MED ORDER — NEPRO/CARBSTEADY PO LIQD
237.0000 mL | Freq: Two times a day (BID) | ORAL | Status: DC
  Administered 2015-02-18: 237 mL via ORAL

## 2015-02-17 MED ORDER — LOSARTAN POTASSIUM 50 MG PO TABS
100.0000 mg | ORAL_TABLET | Freq: Every day | ORAL | Status: DC
  Filled 2015-02-17: qty 2

## 2015-02-17 NOTE — Progress Notes (Signed)
BP med not given as per HMD nurse, Veronica's suggestion.  Patient is scheduled for hemodialysis today.  Report given to oncoming nurse Bobetta Lime, RN.

## 2015-02-17 NOTE — Consult Note (Signed)
[facility] Renal Consultation Note    Indication for Consultation:  Management of ESRD/hemodialysis; anemia, hypertension/volume and secondary hyperparathyroidism PCP: none  HPI: Kerry Cook is a 29 y.o. 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  She received 2 units PRBC late 7/29 with transient improvement to low 8s then Hgb dropped back down into the 6s.  She was sent to the ED for transfusion for Hgb of 6.6 but Hgb was 7.2 post HD) so she was not transfused.  Hgb remained low 6.4 8/22 and she was sent to the ED for transfusion EDW was lowered from EDW 65 to 62 during 8/10 admission and on 8/17 from 62 to 60.5 and she has been leaving at 59.5 with small UF goals 0.5 - 1.5.  She has not had a good appetite since she had pneumonia. She denies problems during HD treatments Past Medical History  Diagnosis Date  . Hypertension   . History of kidney transplant 2012    kidney failure due to hypertension  . Chronic kidney disease     previous hx dialysis  . Dialysis patient    Past Surgical History  Procedure Laterality Date  . Kidney transplant Bilateral 2012   History reviewed. No pertinent family history. Social History:  reports that she has never smoked. She has never used smokeless tobacco. She reports that she does not drink alcohol or use illicit drugs. No Known Allergies Prior to Admission medications   Medication Sig Start Date End Date Taking? Authorizing Provider  amLODipine (NORVASC) 10 MG tablet Take 10 mg by mouth daily.   Yes Historical Provider, MD  ibuprofen (ADVIL,MOTRIN) 200 MG tablet Take 200 mg by mouth every 6 (six) hours as needed for moderate pain.    Yes Historical Provider, MD  labetalol (NORMODYNE) 300 MG tablet Take 300 mg by mouth 3 (three) times daily.   Yes Historical Provider, MD  medroxyPROGESTERone (DEPO-PROVERA)  150 MG/ML injection Inject 1 mL (150 mg total) into the muscle every 3 (three) months. 12/06/14  Yes Shelly Bombard, MD  mycophenolate (MYFORTIC) 360 MG TBEC EC tablet Take 720 mg by mouth 2 (two) times daily.   Yes Historical Provider, MD  predniSONE (DELTASONE) 5 MG tablet Take 5 mg by mouth daily with breakfast.   Yes Historical Provider, MD  tacrolimus (PROGRAF) 1 MG capsule Take 3 mg by mouth 2 (two) times daily.    Yes Historical Provider, MD  Prenat w/o A Vit-FeFum-FePo-FA (CONCEPT OB) 130-92.4-1 MG CAPS Take 1 capsule by mouth daily. Patient not taking: Reported on 02/16/2015 12/06/14   Shelly Bombard, MD   Current Facility-Administered Medications  Medication Dose Route Frequency Provider Last Rate Last Dose  . amLODipine (NORVASC) tablet 10 mg  10 mg Oral Daily Francesca Oman, DO   10 mg at 02/17/15 [id number]  . doxercalciferol (HECTOROL) injection 4 mcg  4 mcg Intravenous Q M,W,F-HD Alric Seton, PA-C      . ferric gluconate (NULECIT) 125 mg in sodium chloride 0.9 % 100 mL IVPB  125 mg Intravenous Q M,W,F-HD Alric Seton, PA-C      . heparin injection 5,000 Units  5,000 Units Subcutaneous 3 times per day Francesca Oman, DO   5,000 Units at 02/16/15 2200  . Influenza vac split quadrivalent PF (FLUARIX) injection 0.5 mL  0.5 mL Intramuscular Tomorrow-1000 Axel Filler, MD   0.5  mL at 02/17/15 1000  . labetalol (NORMODYNE) tablet 300 mg  300 mg Oral Q1200 Axel Filler, MD      . labetalol (NORMODYNE) tablet 600 mg  600 mg Oral BID Axel Filler, MD      . losartan (COZAAR) tablet 100 mg  100 mg Oral Daily [name], MD      . mycophenolate (MYFORTIC) EC tablet 720 mg  720 mg Oral BID Francesca Oman, DO   720 mg at 02/17/15 [id number]  . potassium chloride SA (K-DUR,KLOR-CON) CR tablet 20 mEq  20 mEq Oral BID [name], MD      . predniSONE (DELTASONE) tablet 5 mg  5 mg Oral Q breakfast Francesca Oman, DO   5 mg at 02/17/15 0820  . sodium chloride 0.9 % injection 3 mL   3 mL Intravenous Q12H Francesca Oman, DO   3 mL at 02/17/15 0249  . sodium chloride 0.9 % injection 3 mL  3 mL Intravenous Q12H Francesca Oman, DO   0 mL at 02/16/15 2200  . tacrolimus (PROGRAF) capsule 3 mg  3 mg Oral BID Francesca Oman, DO   3 mg at 02/17/15 [id number]   Labs: Basic Metabolic Panel:  Recent Labs Lab 02/10/15 1941 02/16/15 1440 02/17/15 0516  NA 137 136 136  K 2.8* 2.9* 3.0*  CL 94* 92* 94*  CO2 30 30 31   GLUCOSE 93 78 77  BUN <5* 6 10  CREATININE 3.56* 5.53* 7.08*  CALCIUM 9.4 9.9 9.2  PHOS  --   --  4.4   Liver Function Tests:  Recent Labs Lab 02/10/15 1941 02/16/15 1440 02/17/15 0516  AST 22 24  --   ALT 9* 10*  --   ALKPHOS 65 69  --   BILITOT 0.9 0.8  --   PROT 8.7* 8.6*  --   ALBUMIN 3.1* 3.3* 2.6*   CBC:  Recent Labs Lab 02/10/15 1941 02/16/15 1440 02/17/15 0516  WBC 10.3 9.2 8.0  NEUTROABS  --  6.1  --   HGB 7.2* 7.0* 7.2*  HCT 23.5* 23.5* 23.6*  MCV 78.3 80.8 79.2  PLT 158 278 272  Studies/Results: Dg Chest 2 View  02/16/2015   CLINICAL DATA:  Dialysis patient, no acute symptoms, pre blood transfusion exam.  EXAM: CHEST  2 VIEW  COMPARISON:  PA and lateral chest x-ray of February 01, 2015  FINDINGS: The cardiac silhouette is mildly enlarged. The pulmonary vascularity is prominent centrally with mild cephalization noted. There is mild pulmonary interstitial edema. There is a trace of pleural fluid blunting the costophrenic angles. The bony thorax exhibits no acute abnormality.  IMPRESSION: CHF with mild pulmonary interstitial edema and trace bilateral pleural effusions. There is no acute bony abnormality.   Electronically Signed   By: David  Martinique M.D.   On: 02/16/2015 15:32   ROS: As per HPI otherwise negative.  Physical Exam: Filed Vitals:   02/17/15 0700 02/17/15 0800 02/17/15 0925 02/17/15 0926  BP: 191/119 193/116 194/121 194/121  Pulse: 87 87 90   Temp:  97.8 F (36.6 C)    TempSrc:  Oral    Resp: 11 32    Height:      Weight:       SpO2: 97% 94%       General: Well developed, slender in no acute distress. Head: Normocephalic, atraumatic, sclera non-icteric, mucus membranes are moist Neck: Supple. + JVD  Lungs: rales right base Heart: RRR with S1  S2. No murmurs, rubs, or gallops appreciated. Abdomen: Soft, non-tender, non-distended with normoactive bowel sounds. RLQ tx kidneys nontender M-S:  Strength and tone appear normal for age. Lower extremities:without edema or ischemic changes, no open wounds  Neuro: Alert and oriented X 3. Moves all extremities spontaneously. Psych:  Responds to questions appropriately with a normal affect. Dialysis Access:left lower AVF + bruit  Dialysis Orders: Center: MWF AF  3.75 180 400/A 1.5 EDW 60.5 2 K 2.25 Ca left upper AVF heparin 6900 venofer 50 per week Mircera 225 given 8/24 hectorol 4 venofer 50 per week Recent labs:  Hgb 6.2 7/29, 6.6 8/10, 6.2 8/22, 20% sat ferritin 1000 iPTH 202 7/27 coming down  Assessment/Plan: 1. Malignant hypertension - variable compliance by history; labetolol recently increased on norvasc 10, losartan 100 labetolol 600 in the am 300 mid day and 600 in the pm; also needs decrease in EDW, titrate meds with prns 2. ESRD with failed renal transplant MWF  HD today, lower edw; continue same transplant meds, prograf, pred and myfortic 3. Excess volume  - mild interstitial edema and bilateral pleural effusions - needs EDW lowered further will help with BP control 4. Anemia  - suspect some ESA resistance due to HTN, failed kidney transplant - continue Mircera, transfuse 2 units while here; may need more; ^ IV Fe to 125 x 4 5. Metabolic bone disease -  iPTH improved, continue Hectorol 6. Nutrition - losing weight, renal diet - add nepro 7. Hypokalemia - 3 K - using 4 K bath today and getting blood - probably doesn't need supplemental PO K CL - has d/c'd for now - repeat BMP in am, can supplement then if needed  Myriam Jacobson, PA-C [place] [phone] 02/17/2015, 11:38 AM   Pt seen, examined and agree w A/P as above. Recently started on HD for ESRD, BP's high, dry wt has been dropping but probably needs more volume off to get BP under control. Plan HD today, get vol down.  Kelly Splinter MD pager [phone]    cell [phone] 02/18/2015, 8:37 AM

## 2015-02-17 NOTE — H&P (Signed)
Internal Medicine Attending Admission Note  I saw and evaluated the patient. I reviewed the resident's note and I agree with the resident's findings and plan as documented in the resident's note.  Assessment & Plan by Problem:  Principal Problem:   Hypertensive urgency Active Problems:   End stage renal disease on dialysis   Symptomatic anemia   Hypertension    Hypertension: Dialysis patient with significant hypertension. I don't think this was hypertensive emergency because there were no symptoms or signs of end organ dysfunction. Rather it seems she has equilibrated to elevated blood pressures over the last several months since initiating dialysis again. I agree with discontinuation of Nicardipine, and we will reinitiate her home antihypertensives. We communicated directly with the dialysis center to find her correct home antihypertensive doses. I anticipate with initiation of these, her blood pressure will return to a safe level over the next few days. - Reinitiate labetalol 600mg  qAM, 300mg  noon, and 600mg  qPM.  - Also on full dose of losartan and amlodipine. - Would use clonidine 0.1 mg as needed for symptomatic hypertension. You can repeat this dose every hour x3 until symptoms resolve.  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 No recently missed HD sessions. She continues to have problems with chronic pulmonary edema that lead to dyspnea on exertion. Her usual schedule is Monday, Wednesday, Friday, so she will go for dialysis today. She does still make moderate amount of urine, so we could escalate diuretic dose to try to help with fluid overload. We will talk about this with her nephrologist.   Chief Complaint(s):  Hypertension  History - key components related to admission:  29 year old woman who has end-stage renal disease and was reinitiated on dialysis in May 2016 after failed renal transplant from 2012, was sent to the emergency department directly from infusion center  because of hypertension with systolics over [id number]. It seems since reinitiating hemodialysis, the patient has had progressive difficulty with hypertension. One week ago her hemodialysis physician increased labetalol dosage to 600 mg every morning, 3 mg at noon, and 600 mg in the evening. It seems like there is a communication problem with this because the patient was unable to fill this at the pharmacy. She also stops taking her old dose of labetalol thinking that she would get the new dosage soon. So for about 3 days leading up to this admission she was without her home labetalol. She was referred to the infusion center for a non-urgent blood transfusion because of symptomatic anemia, which has been classified as related to ESRD. While in that center she denies having any headaches, chest pain, abdominal pain, nausea or vomiting. She does say that she has had progressive fatigue, and dyspnea both with exertion and at rest over the past several weeks.   Overnight her blood pressure was aggressively treated with a Nicardipine infusion initiated in the emergency department. This morning she was sleeping comfortably, infusion was discontinued a few hours ago, blood pressure when we saw her was 190. She had no further complaints about headache, chest pain, nausea or vomiting. She feels that she is at near her baseline except for tiredness and shortness of breath.   Lab results: Reviewed in Epic  Physical Exam - key components related to admission:  Filed Vitals:   02/17/15 0700 02/17/15 0800 02/17/15 0925 02/17/15 0926  BP: 191/119 193/116 194/121 194/121  Pulse: 87 87 90   Temp:  97.8 F (36.6 C)    TempSrc:  Oral  Resp: 11 32    Height:      Weight:      SpO2: 97% 94%      Gen: Well appearing young woman, sleeping comfortably.  ENT: MMM CV: RRR, no murmurs Lungs: unlabored, shallow sounds, clear throughout Adb: soft, NT ND Ext: Warm, well perfused, no edema Neuro: alert and oriented, CN  normal, full strength in upper and lower extremities.

## 2015-02-17 NOTE — Progress Notes (Deleted)
Exam: Alert, no distress, calm +JVD Throat clear, PERRL, EOMI Chest rales R base, L clear  RRR no MRG abd soft ntnd no mass or ascites, +bs Trace LE edema bilat Neuro is alert, Ox 3, nonfocal Left forearm AVF +bruit  CM w mild IS edema, trace bilat effusions

## 2015-02-17 NOTE — Progress Notes (Signed)
   Subjective: Patient states that she is feeling improved this morning without SOB or feeling tired. Blood pressures remain high. Objective: Vital signs in last 24 hours: Filed Vitals:   02/17/15 0700 02/17/15 0800 02/17/15 0925 02/17/15 0926  BP: 191/119 193/116 194/121 194/121  Pulse: 87 87 90   Temp:  97.8 F (36.6 C)    TempSrc:  Oral    Resp: 11 32    Height:      Weight:      SpO2: 97% 94%     Weight change:   Intake/Output Summary (Last 24 hours) at 02/17/15 1141 Last data filed at 02/17/15 0249  Gross per 24 hour  Intake    335 ml  Output      0 ml  Net    335 ml   General: resting in bed Cardiac: RRR, no rubs, murmurs or gallops Pulm: decreased sounds, clear to auscultation bilaterally Abd: soft, nontender, nondistended Ext: warm and well perfused, no pedal edema   Assessment/Plan:  Hypertension: Patient's elevated BPs likley due to her missed doses of labetalol when her pharmacy was unable to fill. Blood pressures continue to be high in the 180s/100s during exam. She was transitioned from Nicardipine drip to oral Labetalol and Amlodipine. Her BP medications were recently adjusted and we will continue with her new regimen found via dialysis center. She has no symptoms concerning for hypertensive emergency. -Labetalol 600 mg morning, 300 mg noon, 600 mg night -Amlodipine 10 mg daily -Losartan 100 mg daily -Discontinue IV Labetalol 10 mg q2h PRN for SBP >170 or DBP >100 -If symptomatic HTN can consider Clonidine 0.1 mg  Anemia of Chronic Disease: Hgb 7.2 this morning s/p transfusion 1 PRBC overnight. Hgb has ranged from 7-9 since July 2016. Her symptoms of SOB, tiredness have improved.   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 She is on taper of anti-rejection medications. Nephrology following for which we are appreciative. -Continue Mycophenolate 360 mg BID, Tacrolimus 1 mg BID, Prednisone 5  mg daily -HD today    Dispo: Disposition is deferred at this time, awaiting improvement of current medical problems.  Anticipated discharge in approximately 0-1 day(s).   The patient does not have a current PCP (No Pcp Per Patient) and does need an [facility] hospital follow-up appointment after discharge.  The patient does not have transportation limitations that hinder transportation to clinic appointments.  .Services Needed at time of discharge: Y = Yes, Blank = No PT:   OT:   RN:   Equipment:   Other:     LOS: 1 day   Kerry Finders, MD 02/17/2015, 11:41 AM

## 2015-02-18 DIAGNOSIS — I120 Hypertensive chronic kidney disease with stage 5 chronic kidney disease or end stage renal disease: Secondary | ICD-10-CM | POA: Diagnosis not present

## 2015-02-18 DIAGNOSIS — Z992 Dependence on renal dialysis: Secondary | ICD-10-CM | POA: Diagnosis not present

## 2015-02-18 DIAGNOSIS — E876 Hypokalemia: Secondary | ICD-10-CM | POA: Diagnosis not present

## 2015-02-18 DIAGNOSIS — N186 End stage renal disease: Secondary | ICD-10-CM | POA: Diagnosis not present

## 2015-02-18 LAB — CBC
HCT: 28.9 % — ABNORMAL LOW (ref 36.0–46.0)
Hemoglobin: 9 g/dL — ABNORMAL LOW (ref 12.0–15.0)
MCH: 24.8 pg — ABNORMAL LOW (ref 26.0–34.0)
MCHC: 31.1 g/dL (ref 30.0–36.0)
MCV: 79.6 fL (ref 78.0–100.0)
Platelets: 288 10*3/uL (ref 150–400)
RBC: 3.63 MIL/uL — ABNORMAL LOW (ref 3.87–5.11)
RDW: 20.7 % — ABNORMAL HIGH (ref 11.5–15.5)
WBC: 8.8 10*3/uL (ref 4.0–10.5)

## 2015-02-18 LAB — BASIC METABOLIC PANEL
Anion gap: 11 (ref 5–15)
BUN: 5 mg/dL — ABNORMAL LOW (ref 6–20)
CO2: 29 mmol/L (ref 22–32)
Calcium: 9.4 mg/dL (ref 8.9–10.3)
Chloride: 96 mmol/L — ABNORMAL LOW (ref 101–111)
Creatinine, Ser: 4.45 mg/dL — ABNORMAL HIGH (ref 0.44–1.00)
GFR calc Af Amer: 14 mL/min — ABNORMAL LOW (ref >60)
GFR calc non Af Amer: 12 mL/min — ABNORMAL LOW (ref >60)
Glucose, Bld: 75 mg/dL (ref 65–99)
Potassium: 3.3 mmol/L — ABNORMAL LOW (ref 3.5–5.1)
Sodium: 136 mmol/L (ref 135–145)

## 2015-02-18 MED ORDER — SODIUM CHLORIDE 0.9 % IV SOLN: 100.0000 mL | INTRAVENOUS | Status: DC | PRN

## 2015-02-18 MED ORDER — PENTAFLUOROPROP-TETRAFLUOROETH EX AERO: 1.0000 "application " | INHALATION_SPRAY | CUTANEOUS | Status: DC | PRN

## 2015-02-18 MED ORDER — LABETALOL HCL 300 MG PO TABS: 300.0000 mg | ORAL_TABLET | Freq: Three times a day (TID) | ORAL | Status: DC

## 2015-02-18 MED ORDER — HEPARIN SODIUM (PORCINE) 1000 UNIT/ML DIALYSIS: 1000.0000 [IU] | INTRAMUSCULAR | Status: DC | PRN

## 2015-02-18 MED ORDER — LIDOCAINE HCL (PF) 1 % IJ SOLN: 5.0000 mL | INTRAMUSCULAR | Status: DC | PRN

## 2015-02-18 MED ORDER — ALTEPLASE 2 MG IJ SOLR
2.0000 mg | Freq: Once | INTRAMUSCULAR | Status: DC | PRN
  Filled 2015-02-18: qty 2

## 2015-02-18 MED ORDER — LIDOCAINE-PRILOCAINE 2.5-2.5 % EX CREA
1.0000 "application " | TOPICAL_CREAM | CUTANEOUS | Status: DC | PRN
  Filled 2015-02-18: qty 5

## 2015-02-18 MED ORDER — NEPRO/CARBSTEADY PO LIQD
237.0000 mL | ORAL | Status: DC | PRN
  Filled 2015-02-18: qty 237

## 2015-02-18 MED ORDER — LOSARTAN POTASSIUM 100 MG PO TABS
100.0000 mg | ORAL_TABLET | Freq: Every day | ORAL | Status: DC
Start: 2015-02-18 — End: 2015-09-13

## 2015-02-18 MED ORDER — HEPARIN SODIUM (PORCINE) 1000 UNIT/ML DIALYSIS: 3000.0000 [IU] | Freq: Once | INTRAMUSCULAR | Status: DC

## 2015-02-18 NOTE — Progress Notes (Signed)
Patient given discharge instructions. Patient given information on medication. Patient instructed to call MD for any changes. Patient verbalized understanding. Denies any pain or discomfort at this time. BP 155/95.

## 2015-02-18 NOTE — Discharge Instructions (Signed)
We are glad you are feeling better Ms. Kerry Cook. Please continue to take your medications as prescribed and attend your dialysis sessions as scheduled.

## 2015-02-18 NOTE — Progress Notes (Signed)
   Subjective: Patient is feeling good, breathing and energy better. Objective: Vital signs in last 24 hours: Filed Vitals:   02/18/15 0113 02/18/15 0150 02/18/15 0501 02/18/15 0840  BP: 156/105 145/95 167/110 156/97  Pulse: 85   85  Temp: 97.6 F (36.4 C) 97.8 F (36.6 C) 97.4 F (36.3 C) 97.4 F (36.3 C)  TempSrc: Oral Oral Oral Oral  Resp: 20 16 26 22   Height:      Weight:      SpO2: 100% 97% 96% 100%   Weight change: 1 lb 15.7 oz (0.9 kg)  Intake/Output Summary (Last 24 hours) at 02/18/15 [id number] Last data filed at 02/18/15 0501  Gross per 24 hour  Intake    335 ml  Output   3100 ml  Net  -2765 ml   General: resting in bed Cardiac: RRR, no rubs, murmurs or gallops Pulm: decreased sounds, clear to auscultation bilaterally Abd: soft, nontender, nondistended Ext: warm and well perfused, no pedal edema   Assessment/Plan:  Hypertension: Patient's elevated BPs likley due to her missed doses of labetalol when her pharmacy was unable to fill. BPs still high but improved (156/97), she has no concerning symptoms. -Labetalol 600 mg morning, 300 mg noon, 600 mg night -Amlodipine 10 mg daily -Losartan 100 mg daily   Anemia of Chronic Disease: Hgb 9.3 this morning s/p transfusion of 2nd PRBC during hospital stay. Hgb has ranged from 7-9 since July 2016. Her symptoms of SOB, tiredness have improved.   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 Had HD yesterday. She is on taper of anti-rejection medications. Nephrology following for which we are appreciative. -Continue Mycophenolate 360 mg BID, Tacrolimus 1 mg BID, Prednisone 5 mg daily -Short HD today to lower dry weight, ok to dc to home afterwards    Dispo: Disposition is deferred at this time, awaiting improvement of current medical problems.  Anticipated discharge today(s).   The patient does not have a current PCP (No Pcp Per Patient) and does need an [facility] hospital follow-up  appointment after discharge.    LOS: 2 days   [name], MD 02/18/2015, 9:53 AM

## 2015-02-18 NOTE — Discharge Summary (Signed)
Name: Kerry Cook MRN: [id number] DOB: 29-Aug-1985 29 y.o. PCP: No Pcp Per Patient  Date of Admission: 02/16/2015  2:42 PM Date of Discharge: 02/18/2015 Attending Physician: Axel Filler, MD  Discharge Diagnosis: 1. ESRD on dialysis  Principal Problem:   Hypertensive urgency Active Problems:   End stage renal disease on dialysis   Symptomatic anemia   Hypertension  Discharge Medications:   Medication List    STOP taking these medications        CONCEPT OB 130-92.4-1 MG Caps      TAKE these medications        amLODipine 10 MG tablet  Commonly known as:  NORVASC  Take 10 mg by mouth daily.     ibuprofen 200 MG tablet  Commonly known as:  ADVIL,MOTRIN  Take 200 mg by mouth every 6 (six) hours as needed for moderate pain.     labetalol 300 MG tablet  Commonly known as:  NORMODYNE  Take 1 tablet (300 mg total) by mouth 3 (three) times daily. 2 tabs am (600 mg), 1 tab noon (300 mg), 2 tabs pm (600 mg)     losartan 100 MG tablet  Commonly known as:  COZAAR  Take 1 tablet (100 mg total) by mouth daily.     medroxyPROGESTERone 150 MG/ML injection  Commonly known as:  DEPO-PROVERA  Inject 1 mL (150 mg total) into the muscle every 3 (three) months.     mycophenolate 360 MG Tbec EC tablet  Commonly known as:  MYFORTIC  Take 720 mg by mouth 2 (two) times daily.     predniSONE 5 MG tablet  Commonly known as:  DELTASONE  Take 5 mg by mouth daily with breakfast.     tacrolimus 1 MG capsule  Commonly known as:  PROGRAF  Take 3 mg by mouth 2 (two) times daily.        Disposition and follow-up:   Kerry Cook was discharged from [facility] in Good condition.  At the hospital follow up visit please address:  1.  Hypertension: Please ask patient if she is taking medications as directed and assess her blood pressures and if she is having any symptoms. Recently modified dosage of Labetalol 600 mg am, 300 mg noon, 600 mg pm. Also  on Losartan 100 mg qd and Amlodipine 10 mg qd.  ESRD on HD: Patient to continue with MWF dialysis at [facility].  2.  Labs / imaging needed at time of follow-up: CBC  3.  Pending labs/ test needing follow-up: none  Follow-up Appointments: Follow-up Information    Follow up with Windy Kalata, MD.   Specialty:  Nephrology   Contact information:   [address] [place] [address] [phone]       Discharge Instructions: Discharge Instructions    Call MD for:  difficulty breathing, headache or visual disturbances    Complete by:  As directed      Call MD for:  extreme fatigue    Complete by:  As directed      Call MD for:  persistant nausea and vomiting    Complete by:  As directed      Call MD for:  severe uncontrolled pain    Complete by:  As directed      Diet - low sodium heart healthy    Complete by:  As directed      Increase activity slowly    Complete by:  As directed  Consultations: Treatment Team:  Roney Jaffe, MD  Procedures Performed:  Dg Chest 2 View  02/16/2015   CLINICAL DATA:  Dialysis patient, no acute symptoms, pre blood transfusion exam.  EXAM: CHEST  2 VIEW  COMPARISON:  PA and lateral chest x-ray of February 01, 2015  FINDINGS: The cardiac silhouette is mildly enlarged. The pulmonary vascularity is prominent centrally with mild cephalization noted. There is mild pulmonary interstitial edema. There is a trace of pleural fluid blunting the costophrenic angles. The bony thorax exhibits no acute abnormality.  IMPRESSION: CHF with mild pulmonary interstitial edema and trace bilateral pleural effusions. There is no acute bony abnormality.   Electronically Signed   By: David  Martinique M.D.   On: 02/16/2015 15:32   Dg Chest 2 View  02/01/2015   CLINICAL DATA:  Recent diagnosis of pneumonia.  Shortness of breath.  EXAM: CHEST  2 VIEW  COMPARISON:  01/21/2015.  FINDINGS: The heart size is mildly enlarged. Small bilateral pleural effusions are  new from previous exam. There is mild interstitial stress set diffuse pulmonary vascular congestion noted. No airspace consolidation.  IMPRESSION: 1. Cardiac enlarged, bilateral pleural effusions and pulmonary vascular congestion.   Electronically Signed   By: Kerby Moors M.D.   On: 02/01/2015 17:01   Dg Chest 2 View  01/21/2015   CLINICAL DATA:  29 year old female with a history of fever. Nonsmoker.  EXAM: CHEST - 2 VIEW  COMPARISON:  03/16/2014  FINDINGS: Cardiomediastinal silhouette projects within normal limits in size and contour. Ill-defined interstitial/ airspace disease at the right base. Ill-defined opacity in the retrocardiac region. No pneumothorax or pleural effusion.  .  No displaced fracture.  Unremarkable appearance of the upper abdomen.  IMPRESSION: Interstitial/airspace disease of the bilateral bases, new from the comparison concerning for multifocal infection/bronchitis given the history.  Signed,  Dulcy Fanny. Earleen Newport, DO  [facility]  [facility]   Electronically Signed   By: Corrie Mckusick D.O.   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 At her dialysis session on 8/24, she was noted to have a HgB of 6.3. At her short stay for transfusion, she was noted to be hypertensive and give 0.1 mg clonidine without significant change. According to the patient, she had not taken her labetalol for 2 days, but she had been taking her amlodipine. She denies any vaginal or rectal bleeding. She denies chills, chest pain, syncope, headache, nausea, vomiting, or dysuria.  In the ED, her HgB was 7.0, a two point drop over 2 weeks, and was noted to have interstitial edema with bilateral pleural effusions on CXR. IV Cardene was started in the ED. She was admitted for control of  her hypertension prior to transfusion.   Hospital Course by problem list: Principal Problem:   Hypertensive urgency Active Problems:   End stage renal disease on dialysis   Symptomatic anemia   Hypertension   Hypertension: Patient admitted due to high blood pressures >210/110s. She was to have blood transfusion at short stay due to Hgb 6.3 but was sent to hospital to get BP under control first. She did not have any symptoms concerning for hypertensive emergency but did have symptomatic anemia with fatigue and SOB. She was started on Nifidipine drip which was transitioned to Labetalol 300 mg TID and Amlodipine  10 mg daily with IV Labetalol 10 mg q2h prn for BPs >170/100.  Further investigation revealed that patient missed 2 days worth of antihypertensive medications as pharmacy was unable to fill her prescription. We discontinued her IV Labetalol and modified her oral regimen to her most current dose as prescribed by nephrology (600 mg am, 300 mg noon, 600 mg pm) and added Losartan 100 mg daily which she is also on at home. Blood pressures continue to be high but improved from admission which has been the case lately and should be monitored as well as her medication adherence.   ESRD 2/2 FSGS s/p Transplant Failure: Patient had full HD on 8/26 and short course on 8/27 to lower dry weight. Continued on home anti-rejection medications.   Symptomatic Anemia: Patient admitted with Hgb 6.3 with symptoms of fatigue and SOB. She received 2 PRBCs during hospital stay with post-transfusion Hgb of 9.0. Her symptoms improved with transfusions and dialysis.    Discharge Vitals:   BP 156/104 mmHg  Pulse 95  Temp(Src) 97.3 F (36.3 C) (Oral)  Resp 22  Ht 5' (1.524 m)  Wt 122 lb 5.7 oz (55.5 kg)  BMI 23.90 kg/m2  SpO2 94%  LMP 01/06/2015  Discharge Labs:  Results for orders placed or performed during the hospital encounter of 02/16/15 (from the past 24 hour(s))  Basic metabolic panel     Status:  Abnormal   Collection Time: 02/17/15  4:09 PM  Result Value Ref Range   Sodium 135 135 - 145 mmol/L   Potassium 3.3 (L) 3.5 - 5.1 mmol/L   Chloride 94 (L) 101 - 111 mmol/L   CO2 29 22 - 32 mmol/L   Glucose, Bld 95 65 - 99 mg/dL   BUN 12 6 - 20 mg/dL   Creatinine, Ser 8.09 (H) 0.44 - 1.00 mg/dL   Calcium 9.4 8.9 - 10.3 mg/dL   GFR calc non Af Amer 6 (L) >60 mL/min   GFR calc Af Amer 7 (L) >60 mL/min   Anion gap 12 5 - 15  Prepare RBC     Status: None   Collection Time: 02/17/15  5:11 PM  Result Value Ref Range   Order Confirmation ORDER PROCESSED BY BLOOD BANK   CBC     Status: Abnormal   Collection Time: 02/18/15  5:40 AM  Result Value Ref Range   WBC 8.8 4.0 - 10.5 K/uL   RBC 3.63 (L) 3.87 - 5.11 MIL/uL   Hemoglobin 9.0 (L) 12.0 - 15.0 g/dL   HCT 28.9 (L) 36.0 - 46.0 %   MCV 79.6 78.0 - 100.0 fL   MCH 24.8 (L) 26.0 - 34.0 pg   MCHC 31.1 30.0 - 36.0 g/dL   RDW 20.7 (H) 11.5 - 15.5 %   Platelets 288 150 - 400 K/uL  Basic metabolic panel     Status: Abnormal   Collection Time: 02/18/15  5:40 AM  Result Value Ref Range   Sodium 136 135 - 145 mmol/L   Potassium 3.3 (L) 3.5 - 5.1 mmol/L   Chloride 96 (L) 101 - 111 mmol/L   CO2 29 22 - 32 mmol/L   Glucose, Bld 75 65 - 99 mg/dL   BUN 5 (L) 6 - 20 mg/dL   Creatinine, Ser 4.45 (H) 0.44 - 1.00 mg/dL   Calcium 9.4 8.9 - 10.3 mg/dL   GFR calc non Af Amer 12 (L) >60 mL/min   GFR calc Af Amer 14 (L) >60 mL/min   Anion gap  11 5 - 15    Signed: [name], MD 02/18/2015, 1:09 PM    Services Ordered on Discharge: none Equipment Ordered on Discharge: none

## 2015-02-18 NOTE — Progress Notes (Signed)
  [facility] Progress Note   Subjective: i'm breathing much better  Filed Vitals:   02/18/15 0113 02/18/15 0150 02/18/15 0501 02/18/15 0840  BP: 156/105 145/95 167/110 156/97  Pulse: 85   85  Temp: 97.6 F (36.4 C) 97.8 F (36.6 C) 97.4 F (36.3 C) 97.4 F (36.3 C)  TempSrc: Oral Oral Oral Oral  Resp: 20 16 26 22   Height:      Weight:      SpO2: 100% 97% 96% 100%   Exam: Alert, no distress No O2 on  No jvd Chest clear bilat RRR no MRG ABd soft ntnd No LE edema LUA AVF +bruit      Assessment: 1 Pulm edema/ vol overload, better 2 ESRD has lost body wt 3 HTN crisis, better 4 Anemia s/p transfusion  Plan - short HD today then ok for dc home after HD, lowering dry wt    Kelly Splinter MD  pager [phone]    cell [phone]  02/18/2015, 9:41 AM     Recent Labs Lab 02/17/15 0516 02/17/15 1609 02/18/15 0540  NA 136 135 136  K 3.0* 3.3* 3.3*  CL 94* 94* 96*  CO2 31 29 29   GLUCOSE 77 95 75  BUN 10 12 5*  CREATININE 7.08* 8.09* 4.45*  CALCIUM 9.2 9.4 9.4  PHOS 4.4  --   --     Recent Labs Lab 02/16/15 1440 02/17/15 0516  AST 24  --   ALT 10*  --   ALKPHOS 69  --   BILITOT 0.8  --   PROT 8.6*  --   ALBUMIN 3.3* 2.6*    Recent Labs Lab 02/16/15 1440 02/17/15 0516 02/18/15 0540  WBC 9.2 8.0 8.8  NEUTROABS 6.1  --   --   HGB 7.0* 7.2* 9.0*  HCT 23.5* 23.6* 28.9*  MCV 80.8 79.2 79.6  PLT 278 272 288   . sodium chloride   Intravenous Once  . amLODipine  10 mg Oral Daily  . doxercalciferol  4 mcg Intravenous Q M,W,F-HD  . feeding supplement (NEPRO CARB STEADY)  237 mL Oral BID BM  . ferric gluconate (FERRLECIT/NULECIT) IV  125 mg Intravenous Q M,W,F-HD  . heparin  5,000 Units Subcutaneous 3 times per day  . Influenza vac split quadrivalent PF  0.5 mL Intramuscular Tomorrow-1000  . labetalol  300 mg Oral Q1200  . labetalol  600 mg Oral BID  . losartan  100 mg Oral Daily  . multivitamin  1 tablet Oral QHS  . mycophenolate  720  mg Oral BID  . predniSONE  5 mg Oral Q breakfast  . sodium chloride  3 mL Intravenous Q12H  . sodium chloride  3 mL Intravenous Q12H  . tacrolimus  3 mg Oral BID     sodium chloride, sodium chloride, alteplase, feeding supplement (NEPRO CARB STEADY), heparin, heparin, lidocaine (PF), lidocaine-prilocaine, pentafluoroprop-tetrafluoroeth

## 2015-02-19 LAB — TYPE AND SCREEN
ABO/RH(D): O POS
Antibody Screen: POSITIVE
DAT, IgG: NEGATIVE
DONOR AG TYPE: NEGATIVE
DONOR AG TYPE: NEGATIVE
UNIT DIVISION: 0
UNIT DIVISION: 0

## 2015-02-20 DIAGNOSIS — N2581 Secondary hyperparathyroidism of renal origin: Secondary | ICD-10-CM | POA: Diagnosis not present

## 2015-02-20 DIAGNOSIS — D631 Anemia in chronic kidney disease: Secondary | ICD-10-CM | POA: Diagnosis not present

## 2015-02-20 DIAGNOSIS — D509 Iron deficiency anemia, unspecified: Secondary | ICD-10-CM | POA: Diagnosis not present

## 2015-02-20 DIAGNOSIS — N186 End stage renal disease: Secondary | ICD-10-CM | POA: Diagnosis not present

## 2015-02-20 LAB — TYPE AND SCREEN
ABO/RH(D): O POS
Antibody Screen: POSITIVE
DAT, IgG: NEGATIVE
Donor AG Type: NEGATIVE
Donor AG Type: NEGATIVE
UNIT DIVISION: 0
UNIT DIVISION: 0

## 2015-02-22 DIAGNOSIS — Z992 Dependence on renal dialysis: Secondary | ICD-10-CM | POA: Diagnosis not present

## 2015-02-22 DIAGNOSIS — N2581 Secondary hyperparathyroidism of renal origin: Secondary | ICD-10-CM | POA: Diagnosis not present

## 2015-02-22 DIAGNOSIS — N186 End stage renal disease: Secondary | ICD-10-CM | POA: Diagnosis not present

## 2015-02-22 DIAGNOSIS — D509 Iron deficiency anemia, unspecified: Secondary | ICD-10-CM | POA: Diagnosis not present

## 2015-02-22 DIAGNOSIS — T8610 Unspecified complication of kidney transplant: Secondary | ICD-10-CM | POA: Diagnosis not present

## 2015-02-22 DIAGNOSIS — D631 Anemia in chronic kidney disease: Secondary | ICD-10-CM | POA: Diagnosis not present

## 2015-02-24 DIAGNOSIS — D509 Iron deficiency anemia, unspecified: Secondary | ICD-10-CM | POA: Diagnosis not present

## 2015-02-24 DIAGNOSIS — Z23 Encounter for immunization: Secondary | ICD-10-CM | POA: Diagnosis not present

## 2015-02-24 DIAGNOSIS — N186 End stage renal disease: Secondary | ICD-10-CM | POA: Diagnosis not present

## 2015-02-24 DIAGNOSIS — D631 Anemia in chronic kidney disease: Secondary | ICD-10-CM | POA: Diagnosis not present

## 2015-02-24 DIAGNOSIS — N2581 Secondary hyperparathyroidism of renal origin: Secondary | ICD-10-CM | POA: Diagnosis not present

## 2015-02-27 DIAGNOSIS — N2581 Secondary hyperparathyroidism of renal origin: Secondary | ICD-10-CM | POA: Diagnosis not present

## 2015-02-27 DIAGNOSIS — D509 Iron deficiency anemia, unspecified: Secondary | ICD-10-CM | POA: Diagnosis not present

## 2015-02-27 DIAGNOSIS — N186 End stage renal disease: Secondary | ICD-10-CM | POA: Diagnosis not present

## 2015-02-27 DIAGNOSIS — Z23 Encounter for immunization: Secondary | ICD-10-CM | POA: Diagnosis not present

## 2015-02-27 DIAGNOSIS — D631 Anemia in chronic kidney disease: Secondary | ICD-10-CM | POA: Diagnosis not present

## 2015-03-01 DIAGNOSIS — D631 Anemia in chronic kidney disease: Secondary | ICD-10-CM | POA: Diagnosis not present

## 2015-03-01 DIAGNOSIS — D509 Iron deficiency anemia, unspecified: Secondary | ICD-10-CM | POA: Diagnosis not present

## 2015-03-01 DIAGNOSIS — N2581 Secondary hyperparathyroidism of renal origin: Secondary | ICD-10-CM | POA: Diagnosis not present

## 2015-03-01 DIAGNOSIS — N186 End stage renal disease: Secondary | ICD-10-CM | POA: Diagnosis not present

## 2015-03-01 DIAGNOSIS — Z23 Encounter for immunization: Secondary | ICD-10-CM | POA: Diagnosis not present

## 2015-03-03 DIAGNOSIS — D509 Iron deficiency anemia, unspecified: Secondary | ICD-10-CM | POA: Diagnosis not present

## 2015-03-03 DIAGNOSIS — D631 Anemia in chronic kidney disease: Secondary | ICD-10-CM | POA: Diagnosis not present

## 2015-03-03 DIAGNOSIS — N186 End stage renal disease: Secondary | ICD-10-CM | POA: Diagnosis not present

## 2015-03-03 DIAGNOSIS — Z23 Encounter for immunization: Secondary | ICD-10-CM | POA: Diagnosis not present

## 2015-03-03 DIAGNOSIS — N2581 Secondary hyperparathyroidism of renal origin: Secondary | ICD-10-CM | POA: Diagnosis not present

## 2015-03-06 DIAGNOSIS — N186 End stage renal disease: Secondary | ICD-10-CM | POA: Diagnosis not present

## 2015-03-06 DIAGNOSIS — N2581 Secondary hyperparathyroidism of renal origin: Secondary | ICD-10-CM | POA: Diagnosis not present

## 2015-03-06 DIAGNOSIS — Z23 Encounter for immunization: Secondary | ICD-10-CM | POA: Diagnosis not present

## 2015-03-06 DIAGNOSIS — D509 Iron deficiency anemia, unspecified: Secondary | ICD-10-CM | POA: Diagnosis not present

## 2015-03-06 DIAGNOSIS — D631 Anemia in chronic kidney disease: Secondary | ICD-10-CM | POA: Diagnosis not present

## 2015-03-08 DIAGNOSIS — D509 Iron deficiency anemia, unspecified: Secondary | ICD-10-CM | POA: Diagnosis not present

## 2015-03-08 DIAGNOSIS — N186 End stage renal disease: Secondary | ICD-10-CM | POA: Diagnosis not present

## 2015-03-08 DIAGNOSIS — D631 Anemia in chronic kidney disease: Secondary | ICD-10-CM | POA: Diagnosis not present

## 2015-03-08 DIAGNOSIS — N2581 Secondary hyperparathyroidism of renal origin: Secondary | ICD-10-CM | POA: Diagnosis not present

## 2015-03-08 DIAGNOSIS — Z23 Encounter for immunization: Secondary | ICD-10-CM | POA: Diagnosis not present

## 2015-03-10 DIAGNOSIS — N186 End stage renal disease: Secondary | ICD-10-CM | POA: Diagnosis not present

## 2015-03-10 DIAGNOSIS — D631 Anemia in chronic kidney disease: Secondary | ICD-10-CM | POA: Diagnosis not present

## 2015-03-10 DIAGNOSIS — N2581 Secondary hyperparathyroidism of renal origin: Secondary | ICD-10-CM | POA: Diagnosis not present

## 2015-03-10 DIAGNOSIS — Z23 Encounter for immunization: Secondary | ICD-10-CM | POA: Diagnosis not present

## 2015-03-10 DIAGNOSIS — D509 Iron deficiency anemia, unspecified: Secondary | ICD-10-CM | POA: Diagnosis not present

## 2015-03-13 DIAGNOSIS — D509 Iron deficiency anemia, unspecified: Secondary | ICD-10-CM | POA: Diagnosis not present

## 2015-03-13 DIAGNOSIS — N186 End stage renal disease: Secondary | ICD-10-CM | POA: Diagnosis not present

## 2015-03-13 DIAGNOSIS — Z23 Encounter for immunization: Secondary | ICD-10-CM | POA: Diagnosis not present

## 2015-03-13 DIAGNOSIS — D631 Anemia in chronic kidney disease: Secondary | ICD-10-CM | POA: Diagnosis not present

## 2015-03-13 DIAGNOSIS — N2581 Secondary hyperparathyroidism of renal origin: Secondary | ICD-10-CM | POA: Diagnosis not present

## 2015-03-15 DIAGNOSIS — Z23 Encounter for immunization: Secondary | ICD-10-CM | POA: Diagnosis not present

## 2015-03-15 DIAGNOSIS — D631 Anemia in chronic kidney disease: Secondary | ICD-10-CM | POA: Diagnosis not present

## 2015-03-15 DIAGNOSIS — D509 Iron deficiency anemia, unspecified: Secondary | ICD-10-CM | POA: Diagnosis not present

## 2015-03-15 DIAGNOSIS — N186 End stage renal disease: Secondary | ICD-10-CM | POA: Diagnosis not present

## 2015-03-15 DIAGNOSIS — N2581 Secondary hyperparathyroidism of renal origin: Secondary | ICD-10-CM | POA: Diagnosis not present

## 2015-03-17 DIAGNOSIS — Z23 Encounter for immunization: Secondary | ICD-10-CM | POA: Diagnosis not present

## 2015-03-17 DIAGNOSIS — N2581 Secondary hyperparathyroidism of renal origin: Secondary | ICD-10-CM | POA: Diagnosis not present

## 2015-03-17 DIAGNOSIS — D509 Iron deficiency anemia, unspecified: Secondary | ICD-10-CM | POA: Diagnosis not present

## 2015-03-17 DIAGNOSIS — N186 End stage renal disease: Secondary | ICD-10-CM | POA: Diagnosis not present

## 2015-03-17 DIAGNOSIS — D631 Anemia in chronic kidney disease: Secondary | ICD-10-CM | POA: Diagnosis not present

## 2015-03-20 DIAGNOSIS — N2581 Secondary hyperparathyroidism of renal origin: Secondary | ICD-10-CM | POA: Diagnosis not present

## 2015-03-20 DIAGNOSIS — D509 Iron deficiency anemia, unspecified: Secondary | ICD-10-CM | POA: Diagnosis not present

## 2015-03-20 DIAGNOSIS — Z23 Encounter for immunization: Secondary | ICD-10-CM | POA: Diagnosis not present

## 2015-03-20 DIAGNOSIS — D631 Anemia in chronic kidney disease: Secondary | ICD-10-CM | POA: Diagnosis not present

## 2015-03-20 DIAGNOSIS — N186 End stage renal disease: Secondary | ICD-10-CM | POA: Diagnosis not present

## 2015-03-21 ENCOUNTER — Ambulatory Visit (INDEPENDENT_AMBULATORY_CARE_PROVIDER_SITE_OTHER): Payer: Medicare Other | Admitting: Obstetrics

## 2015-03-21 VITALS — BP 195/131 | HR 86 | Temp 98.5°F | Wt 125.0 lb

## 2015-03-21 DIAGNOSIS — N925 Other specified irregular menstruation: Secondary | ICD-10-CM

## 2015-03-21 DIAGNOSIS — N186 End stage renal disease: Secondary | ICD-10-CM | POA: Diagnosis not present

## 2015-03-21 DIAGNOSIS — N911 Secondary amenorrhea: Secondary | ICD-10-CM

## 2015-03-22 ENCOUNTER — Encounter: Payer: Self-pay | Admitting: Obstetrics

## 2015-03-22 DIAGNOSIS — N186 End stage renal disease: Secondary | ICD-10-CM | POA: Diagnosis not present

## 2015-03-22 DIAGNOSIS — D509 Iron deficiency anemia, unspecified: Secondary | ICD-10-CM | POA: Diagnosis not present

## 2015-03-22 DIAGNOSIS — D631 Anemia in chronic kidney disease: Secondary | ICD-10-CM | POA: Diagnosis not present

## 2015-03-22 DIAGNOSIS — Z23 Encounter for immunization: Secondary | ICD-10-CM | POA: Diagnosis not present

## 2015-03-22 DIAGNOSIS — N2581 Secondary hyperparathyroidism of renal origin: Secondary | ICD-10-CM | POA: Diagnosis not present

## 2015-03-22 NOTE — Progress Notes (Signed)
Patient ID: Kerry Cook, female   DOB: 05-18-1986, 29 y.o.   MRN: [id number]  Chief Complaint  Patient presents with  . Menstrual Problem    no cycle since july    HPI Kerry Cook is a 29 y.o. female.  No period since July 2016.  H/O renal failure, on dialysis.  HPI  Past Medical History  Diagnosis Date  . Hypertension   . History of kidney transplant 2012    kidney failure due to hypertension  . Chronic kidney disease     previous hx dialysis  . Dialysis patient     Past Surgical History  Procedure Laterality Date  . Kidney transplant Bilateral 2012    History reviewed. No pertinent family history.  Social History Social History  Substance Use Topics  . Smoking status: Never Smoker   . Smokeless tobacco: Never Used  . Alcohol Use: No    No Known Allergies  Current Outpatient Prescriptions  Medication Sig Dispense Refill  . amLODipine (NORVASC) 10 MG tablet Take 10 mg by mouth daily.    Marland Kitchen labetalol (NORMODYNE) 300 MG tablet Take 1 tablet (300 mg total) by mouth 3 (three) times daily. 2 tabs am (600 mg), 1 tab noon (300 mg), 2 tabs pm (600 mg)    . mycophenolate (MYFORTIC) 360 MG TBEC EC tablet Take 720 mg by mouth 2 (two) times daily.    . predniSONE (DELTASONE) 5 MG tablet Take 5 mg by mouth daily with breakfast.    . tacrolimus (PROGRAF) 1 MG capsule Take 3 mg by mouth 2 (two) times daily.     Marland Kitchen ibuprofen (ADVIL,MOTRIN) 200 MG tablet Take 200 mg by mouth every 6 (six) hours as needed for moderate pain.     Marland Kitchen losartan (COZAAR) 100 MG tablet Take 1 tablet (100 mg total) by mouth daily. (Patient not taking: Reported on 03/21/2015) 30 tablet 0  . medroxyPROGESTERone (DEPO-PROVERA) 150 MG/ML injection Inject 1 mL (150 mg total) into the muscle every 3 (three) months. (Patient not taking: Reported on 03/21/2015) 1 mL 3   No current facility-administered medications for this visit.    Review of Systems Review of Systems Constitutional: negative for fatigue  and weight loss Respiratory: negative for cough and wheezing Cardiovascular: negative for chest pain, fatigue and palpitations Gastrointestinal: negative for abdominal pain and change in bowel habits Genitourinary:negative Integument/breast: negative for nipple discharge Musculoskeletal:negative for myalgias Neurological: negative for gait problems and tremors Behavioral/Psych: negative for abusive relationship, depression Endocrine: negative for temperature intolerance     Blood pressure 195/131, pulse 86, temperature 98.5 F (36.9 C), weight 125 lb (56.7 kg), last menstrual period 01/06/2015, unknown if currently breastfeeding.  Physical Exam Physical Exam:  100% of 10 min visit spent on counseling and coordination of care.   Data Reviewed Labs  Assessment     Amenorrhea, secondary H/O renal failure, on dialysis     Plan    Will follow conservatively.  Probably secondary to renal failure and dialysis treatment. F/U prn   No orders of the defined types were placed in this encounter.   No orders of the defined types were placed in this encounter.

## 2015-03-24 DIAGNOSIS — Z992 Dependence on renal dialysis: Secondary | ICD-10-CM | POA: Diagnosis not present

## 2015-03-24 DIAGNOSIS — D509 Iron deficiency anemia, unspecified: Secondary | ICD-10-CM | POA: Diagnosis not present

## 2015-03-24 DIAGNOSIS — T8610 Unspecified complication of kidney transplant: Secondary | ICD-10-CM | POA: Diagnosis not present

## 2015-03-24 DIAGNOSIS — Z23 Encounter for immunization: Secondary | ICD-10-CM | POA: Diagnosis not present

## 2015-03-24 DIAGNOSIS — N186 End stage renal disease: Secondary | ICD-10-CM | POA: Diagnosis not present

## 2015-03-24 DIAGNOSIS — N2581 Secondary hyperparathyroidism of renal origin: Secondary | ICD-10-CM | POA: Diagnosis not present

## 2015-03-24 DIAGNOSIS — D631 Anemia in chronic kidney disease: Secondary | ICD-10-CM | POA: Diagnosis not present

## 2015-03-27 DIAGNOSIS — N186 End stage renal disease: Secondary | ICD-10-CM | POA: Diagnosis not present

## 2015-03-27 DIAGNOSIS — D631 Anemia in chronic kidney disease: Secondary | ICD-10-CM | POA: Diagnosis not present

## 2015-03-27 DIAGNOSIS — N2581 Secondary hyperparathyroidism of renal origin: Secondary | ICD-10-CM | POA: Diagnosis not present

## 2015-03-27 DIAGNOSIS — D509 Iron deficiency anemia, unspecified: Secondary | ICD-10-CM | POA: Diagnosis not present

## 2015-03-29 DIAGNOSIS — D509 Iron deficiency anemia, unspecified: Secondary | ICD-10-CM | POA: Diagnosis not present

## 2015-03-29 DIAGNOSIS — N186 End stage renal disease: Secondary | ICD-10-CM | POA: Diagnosis not present

## 2015-03-29 DIAGNOSIS — N2581 Secondary hyperparathyroidism of renal origin: Secondary | ICD-10-CM | POA: Diagnosis not present

## 2015-03-29 DIAGNOSIS — D631 Anemia in chronic kidney disease: Secondary | ICD-10-CM | POA: Diagnosis not present

## 2015-03-31 DIAGNOSIS — D509 Iron deficiency anemia, unspecified: Secondary | ICD-10-CM | POA: Diagnosis not present

## 2015-03-31 DIAGNOSIS — D631 Anemia in chronic kidney disease: Secondary | ICD-10-CM | POA: Diagnosis not present

## 2015-03-31 DIAGNOSIS — N2581 Secondary hyperparathyroidism of renal origin: Secondary | ICD-10-CM | POA: Diagnosis not present

## 2015-03-31 DIAGNOSIS — N186 End stage renal disease: Secondary | ICD-10-CM | POA: Diagnosis not present

## 2015-04-03 DIAGNOSIS — N186 End stage renal disease: Secondary | ICD-10-CM | POA: Diagnosis not present

## 2015-04-03 DIAGNOSIS — N2581 Secondary hyperparathyroidism of renal origin: Secondary | ICD-10-CM | POA: Diagnosis not present

## 2015-04-03 DIAGNOSIS — D631 Anemia in chronic kidney disease: Secondary | ICD-10-CM | POA: Diagnosis not present

## 2015-04-03 DIAGNOSIS — D509 Iron deficiency anemia, unspecified: Secondary | ICD-10-CM | POA: Diagnosis not present

## 2015-04-05 DIAGNOSIS — N2581 Secondary hyperparathyroidism of renal origin: Secondary | ICD-10-CM | POA: Diagnosis not present

## 2015-04-05 DIAGNOSIS — N186 End stage renal disease: Secondary | ICD-10-CM | POA: Diagnosis not present

## 2015-04-05 DIAGNOSIS — D631 Anemia in chronic kidney disease: Secondary | ICD-10-CM | POA: Diagnosis not present

## 2015-04-05 DIAGNOSIS — D509 Iron deficiency anemia, unspecified: Secondary | ICD-10-CM | POA: Diagnosis not present

## 2015-04-07 DIAGNOSIS — D631 Anemia in chronic kidney disease: Secondary | ICD-10-CM | POA: Diagnosis not present

## 2015-04-07 DIAGNOSIS — N186 End stage renal disease: Secondary | ICD-10-CM | POA: Diagnosis not present

## 2015-04-07 DIAGNOSIS — N2581 Secondary hyperparathyroidism of renal origin: Secondary | ICD-10-CM | POA: Diagnosis not present

## 2015-04-07 DIAGNOSIS — D509 Iron deficiency anemia, unspecified: Secondary | ICD-10-CM | POA: Diagnosis not present

## 2015-04-10 DIAGNOSIS — N186 End stage renal disease: Secondary | ICD-10-CM | POA: Diagnosis not present

## 2015-04-10 DIAGNOSIS — D631 Anemia in chronic kidney disease: Secondary | ICD-10-CM | POA: Diagnosis not present

## 2015-04-10 DIAGNOSIS — N2581 Secondary hyperparathyroidism of renal origin: Secondary | ICD-10-CM | POA: Diagnosis not present

## 2015-04-10 DIAGNOSIS — D509 Iron deficiency anemia, unspecified: Secondary | ICD-10-CM | POA: Diagnosis not present

## 2015-04-12 DIAGNOSIS — N2581 Secondary hyperparathyroidism of renal origin: Secondary | ICD-10-CM | POA: Diagnosis not present

## 2015-04-12 DIAGNOSIS — D509 Iron deficiency anemia, unspecified: Secondary | ICD-10-CM | POA: Diagnosis not present

## 2015-04-12 DIAGNOSIS — D631 Anemia in chronic kidney disease: Secondary | ICD-10-CM | POA: Diagnosis not present

## 2015-04-12 DIAGNOSIS — N186 End stage renal disease: Secondary | ICD-10-CM | POA: Diagnosis not present

## 2015-04-14 DIAGNOSIS — D631 Anemia in chronic kidney disease: Secondary | ICD-10-CM | POA: Diagnosis not present

## 2015-04-14 DIAGNOSIS — D509 Iron deficiency anemia, unspecified: Secondary | ICD-10-CM | POA: Diagnosis not present

## 2015-04-14 DIAGNOSIS — N2581 Secondary hyperparathyroidism of renal origin: Secondary | ICD-10-CM | POA: Diagnosis not present

## 2015-04-14 DIAGNOSIS — N186 End stage renal disease: Secondary | ICD-10-CM | POA: Diagnosis not present

## 2015-04-15 ENCOUNTER — Other Ambulatory Visit: Payer: Self-pay | Admitting: Internal Medicine

## 2015-04-17 DIAGNOSIS — D631 Anemia in chronic kidney disease: Secondary | ICD-10-CM | POA: Diagnosis not present

## 2015-04-17 DIAGNOSIS — D509 Iron deficiency anemia, unspecified: Secondary | ICD-10-CM | POA: Diagnosis not present

## 2015-04-17 DIAGNOSIS — N2581 Secondary hyperparathyroidism of renal origin: Secondary | ICD-10-CM | POA: Diagnosis not present

## 2015-04-17 DIAGNOSIS — N186 End stage renal disease: Secondary | ICD-10-CM | POA: Diagnosis not present

## 2015-04-19 DIAGNOSIS — N186 End stage renal disease: Secondary | ICD-10-CM | POA: Diagnosis not present

## 2015-04-19 DIAGNOSIS — N2581 Secondary hyperparathyroidism of renal origin: Secondary | ICD-10-CM | POA: Diagnosis not present

## 2015-04-19 DIAGNOSIS — D631 Anemia in chronic kidney disease: Secondary | ICD-10-CM | POA: Diagnosis not present

## 2015-04-19 DIAGNOSIS — D509 Iron deficiency anemia, unspecified: Secondary | ICD-10-CM | POA: Diagnosis not present

## 2015-04-21 DIAGNOSIS — D509 Iron deficiency anemia, unspecified: Secondary | ICD-10-CM | POA: Diagnosis not present

## 2015-04-21 DIAGNOSIS — N2581 Secondary hyperparathyroidism of renal origin: Secondary | ICD-10-CM | POA: Diagnosis not present

## 2015-04-21 DIAGNOSIS — D631 Anemia in chronic kidney disease: Secondary | ICD-10-CM | POA: Diagnosis not present

## 2015-04-21 DIAGNOSIS — N186 End stage renal disease: Secondary | ICD-10-CM | POA: Diagnosis not present

## 2015-04-24 DIAGNOSIS — D509 Iron deficiency anemia, unspecified: Secondary | ICD-10-CM | POA: Diagnosis not present

## 2015-04-24 DIAGNOSIS — N2581 Secondary hyperparathyroidism of renal origin: Secondary | ICD-10-CM | POA: Diagnosis not present

## 2015-04-24 DIAGNOSIS — D631 Anemia in chronic kidney disease: Secondary | ICD-10-CM | POA: Diagnosis not present

## 2015-04-24 DIAGNOSIS — Z992 Dependence on renal dialysis: Secondary | ICD-10-CM | POA: Diagnosis not present

## 2015-04-24 DIAGNOSIS — N186 End stage renal disease: Secondary | ICD-10-CM | POA: Diagnosis not present

## 2015-04-24 DIAGNOSIS — T8610 Unspecified complication of kidney transplant: Secondary | ICD-10-CM | POA: Diagnosis not present

## 2015-04-26 DIAGNOSIS — D509 Iron deficiency anemia, unspecified: Secondary | ICD-10-CM | POA: Diagnosis not present

## 2015-04-26 DIAGNOSIS — N186 End stage renal disease: Secondary | ICD-10-CM | POA: Diagnosis not present

## 2015-04-26 DIAGNOSIS — D631 Anemia in chronic kidney disease: Secondary | ICD-10-CM | POA: Diagnosis not present

## 2015-04-26 DIAGNOSIS — N2581 Secondary hyperparathyroidism of renal origin: Secondary | ICD-10-CM | POA: Diagnosis not present

## 2015-04-28 DIAGNOSIS — D631 Anemia in chronic kidney disease: Secondary | ICD-10-CM | POA: Diagnosis not present

## 2015-04-28 DIAGNOSIS — N186 End stage renal disease: Secondary | ICD-10-CM | POA: Diagnosis not present

## 2015-04-28 DIAGNOSIS — D509 Iron deficiency anemia, unspecified: Secondary | ICD-10-CM | POA: Diagnosis not present

## 2015-04-28 DIAGNOSIS — N2581 Secondary hyperparathyroidism of renal origin: Secondary | ICD-10-CM | POA: Diagnosis not present

## 2015-05-01 DIAGNOSIS — N2581 Secondary hyperparathyroidism of renal origin: Secondary | ICD-10-CM | POA: Diagnosis not present

## 2015-05-01 DIAGNOSIS — D509 Iron deficiency anemia, unspecified: Secondary | ICD-10-CM | POA: Diagnosis not present

## 2015-05-01 DIAGNOSIS — D631 Anemia in chronic kidney disease: Secondary | ICD-10-CM | POA: Diagnosis not present

## 2015-05-01 DIAGNOSIS — N186 End stage renal disease: Secondary | ICD-10-CM | POA: Diagnosis not present

## 2015-05-03 DIAGNOSIS — D631 Anemia in chronic kidney disease: Secondary | ICD-10-CM | POA: Diagnosis not present

## 2015-05-03 DIAGNOSIS — N186 End stage renal disease: Secondary | ICD-10-CM | POA: Diagnosis not present

## 2015-05-03 DIAGNOSIS — N2581 Secondary hyperparathyroidism of renal origin: Secondary | ICD-10-CM | POA: Diagnosis not present

## 2015-05-03 DIAGNOSIS — D509 Iron deficiency anemia, unspecified: Secondary | ICD-10-CM | POA: Diagnosis not present

## 2015-05-05 DIAGNOSIS — D631 Anemia in chronic kidney disease: Secondary | ICD-10-CM | POA: Diagnosis not present

## 2015-05-05 DIAGNOSIS — N2581 Secondary hyperparathyroidism of renal origin: Secondary | ICD-10-CM | POA: Diagnosis not present

## 2015-05-05 DIAGNOSIS — N186 End stage renal disease: Secondary | ICD-10-CM | POA: Diagnosis not present

## 2015-05-05 DIAGNOSIS — D509 Iron deficiency anemia, unspecified: Secondary | ICD-10-CM | POA: Diagnosis not present

## 2015-05-08 DIAGNOSIS — D509 Iron deficiency anemia, unspecified: Secondary | ICD-10-CM | POA: Diagnosis not present

## 2015-05-08 DIAGNOSIS — N186 End stage renal disease: Secondary | ICD-10-CM | POA: Diagnosis not present

## 2015-05-08 DIAGNOSIS — D631 Anemia in chronic kidney disease: Secondary | ICD-10-CM | POA: Diagnosis not present

## 2015-05-08 DIAGNOSIS — N2581 Secondary hyperparathyroidism of renal origin: Secondary | ICD-10-CM | POA: Diagnosis not present

## 2015-05-10 DIAGNOSIS — N186 End stage renal disease: Secondary | ICD-10-CM | POA: Diagnosis not present

## 2015-05-10 DIAGNOSIS — D631 Anemia in chronic kidney disease: Secondary | ICD-10-CM | POA: Diagnosis not present

## 2015-05-10 DIAGNOSIS — D509 Iron deficiency anemia, unspecified: Secondary | ICD-10-CM | POA: Diagnosis not present

## 2015-05-10 DIAGNOSIS — N2581 Secondary hyperparathyroidism of renal origin: Secondary | ICD-10-CM | POA: Diagnosis not present

## 2015-05-12 DIAGNOSIS — D509 Iron deficiency anemia, unspecified: Secondary | ICD-10-CM | POA: Diagnosis not present

## 2015-05-12 DIAGNOSIS — N186 End stage renal disease: Secondary | ICD-10-CM | POA: Diagnosis not present

## 2015-05-12 DIAGNOSIS — N2581 Secondary hyperparathyroidism of renal origin: Secondary | ICD-10-CM | POA: Diagnosis not present

## 2015-05-12 DIAGNOSIS — D631 Anemia in chronic kidney disease: Secondary | ICD-10-CM | POA: Diagnosis not present

## 2015-05-15 DIAGNOSIS — D631 Anemia in chronic kidney disease: Secondary | ICD-10-CM | POA: Diagnosis not present

## 2015-05-15 DIAGNOSIS — N186 End stage renal disease: Secondary | ICD-10-CM | POA: Diagnosis not present

## 2015-05-15 DIAGNOSIS — D509 Iron deficiency anemia, unspecified: Secondary | ICD-10-CM | POA: Diagnosis not present

## 2015-05-15 DIAGNOSIS — N2581 Secondary hyperparathyroidism of renal origin: Secondary | ICD-10-CM | POA: Diagnosis not present

## 2015-05-17 DIAGNOSIS — N186 End stage renal disease: Secondary | ICD-10-CM | POA: Diagnosis not present

## 2015-05-17 DIAGNOSIS — D509 Iron deficiency anemia, unspecified: Secondary | ICD-10-CM | POA: Diagnosis not present

## 2015-05-17 DIAGNOSIS — D631 Anemia in chronic kidney disease: Secondary | ICD-10-CM | POA: Diagnosis not present

## 2015-05-17 DIAGNOSIS — N2581 Secondary hyperparathyroidism of renal origin: Secondary | ICD-10-CM | POA: Diagnosis not present

## 2015-05-20 DIAGNOSIS — N2581 Secondary hyperparathyroidism of renal origin: Secondary | ICD-10-CM | POA: Diagnosis not present

## 2015-05-20 DIAGNOSIS — D631 Anemia in chronic kidney disease: Secondary | ICD-10-CM | POA: Diagnosis not present

## 2015-05-20 DIAGNOSIS — N186 End stage renal disease: Secondary | ICD-10-CM | POA: Diagnosis not present

## 2015-05-20 DIAGNOSIS — D509 Iron deficiency anemia, unspecified: Secondary | ICD-10-CM | POA: Diagnosis not present

## 2015-05-22 DIAGNOSIS — N186 End stage renal disease: Secondary | ICD-10-CM | POA: Diagnosis not present

## 2015-05-22 DIAGNOSIS — D509 Iron deficiency anemia, unspecified: Secondary | ICD-10-CM | POA: Diagnosis not present

## 2015-05-22 DIAGNOSIS — N2581 Secondary hyperparathyroidism of renal origin: Secondary | ICD-10-CM | POA: Diagnosis not present

## 2015-05-22 DIAGNOSIS — D631 Anemia in chronic kidney disease: Secondary | ICD-10-CM | POA: Diagnosis not present

## 2015-05-24 DIAGNOSIS — Z992 Dependence on renal dialysis: Secondary | ICD-10-CM | POA: Diagnosis not present

## 2015-05-24 DIAGNOSIS — T8610 Unspecified complication of kidney transplant: Secondary | ICD-10-CM | POA: Diagnosis not present

## 2015-05-24 DIAGNOSIS — N186 End stage renal disease: Secondary | ICD-10-CM | POA: Diagnosis not present

## 2015-05-24 DIAGNOSIS — D509 Iron deficiency anemia, unspecified: Secondary | ICD-10-CM | POA: Diagnosis not present

## 2015-05-24 DIAGNOSIS — N2581 Secondary hyperparathyroidism of renal origin: Secondary | ICD-10-CM | POA: Diagnosis not present

## 2015-05-24 DIAGNOSIS — D631 Anemia in chronic kidney disease: Secondary | ICD-10-CM | POA: Diagnosis not present

## 2015-05-26 DIAGNOSIS — N2581 Secondary hyperparathyroidism of renal origin: Secondary | ICD-10-CM | POA: Diagnosis not present

## 2015-05-26 DIAGNOSIS — N186 End stage renal disease: Secondary | ICD-10-CM | POA: Diagnosis not present

## 2015-05-26 DIAGNOSIS — D509 Iron deficiency anemia, unspecified: Secondary | ICD-10-CM | POA: Diagnosis not present

## 2015-05-26 DIAGNOSIS — D631 Anemia in chronic kidney disease: Secondary | ICD-10-CM | POA: Diagnosis not present

## 2015-05-29 DIAGNOSIS — N186 End stage renal disease: Secondary | ICD-10-CM | POA: Diagnosis not present

## 2015-05-29 DIAGNOSIS — D631 Anemia in chronic kidney disease: Secondary | ICD-10-CM | POA: Diagnosis not present

## 2015-05-29 DIAGNOSIS — N2581 Secondary hyperparathyroidism of renal origin: Secondary | ICD-10-CM | POA: Diagnosis not present

## 2015-05-29 DIAGNOSIS — D509 Iron deficiency anemia, unspecified: Secondary | ICD-10-CM | POA: Diagnosis not present

## 2015-05-31 DIAGNOSIS — D631 Anemia in chronic kidney disease: Secondary | ICD-10-CM | POA: Diagnosis not present

## 2015-05-31 DIAGNOSIS — D509 Iron deficiency anemia, unspecified: Secondary | ICD-10-CM | POA: Diagnosis not present

## 2015-05-31 DIAGNOSIS — N2581 Secondary hyperparathyroidism of renal origin: Secondary | ICD-10-CM | POA: Diagnosis not present

## 2015-05-31 DIAGNOSIS — N186 End stage renal disease: Secondary | ICD-10-CM | POA: Diagnosis not present

## 2015-06-02 DIAGNOSIS — N186 End stage renal disease: Secondary | ICD-10-CM | POA: Diagnosis not present

## 2015-06-02 DIAGNOSIS — D631 Anemia in chronic kidney disease: Secondary | ICD-10-CM | POA: Diagnosis not present

## 2015-06-02 DIAGNOSIS — N2581 Secondary hyperparathyroidism of renal origin: Secondary | ICD-10-CM | POA: Diagnosis not present

## 2015-06-02 DIAGNOSIS — D509 Iron deficiency anemia, unspecified: Secondary | ICD-10-CM | POA: Diagnosis not present

## 2015-06-05 ENCOUNTER — Encounter (HOSPITAL_COMMUNITY): Payer: Self-pay | Admitting: Emergency Medicine

## 2015-06-05 ENCOUNTER — Emergency Department (HOSPITAL_COMMUNITY)
Admission: EM | Admit: 2015-06-05 | Discharge: 2015-06-06 | Disposition: A | Discharge status: Home / Self Care | Payer: Medicare Other | Attending: Emergency Medicine | Admitting: Emergency Medicine

## 2015-06-05 DIAGNOSIS — Z940 Kidney transplant status: Secondary | ICD-10-CM | POA: Diagnosis not present

## 2015-06-05 DIAGNOSIS — D509 Iron deficiency anemia, unspecified: Secondary | ICD-10-CM | POA: Diagnosis not present

## 2015-06-05 DIAGNOSIS — N2581 Secondary hyperparathyroidism of renal origin: Secondary | ICD-10-CM | POA: Diagnosis not present

## 2015-06-05 DIAGNOSIS — I120 Hypertensive chronic kidney disease with stage 5 chronic kidney disease or end stage renal disease: Secondary | ICD-10-CM | POA: Diagnosis not present

## 2015-06-05 DIAGNOSIS — Z79899 Other long term (current) drug therapy: Secondary | ICD-10-CM | POA: Diagnosis not present

## 2015-06-05 DIAGNOSIS — R112 Nausea with vomiting, unspecified: Secondary | ICD-10-CM | POA: Insufficient documentation

## 2015-06-05 DIAGNOSIS — R51 Headache: Secondary | ICD-10-CM | POA: Insufficient documentation

## 2015-06-05 DIAGNOSIS — H53149 Visual discomfort, unspecified: Secondary | ICD-10-CM | POA: Diagnosis not present

## 2015-06-05 DIAGNOSIS — Z7952 Long term (current) use of systemic steroids: Secondary | ICD-10-CM | POA: Diagnosis not present

## 2015-06-05 DIAGNOSIS — R519 Headache, unspecified: Secondary | ICD-10-CM

## 2015-06-05 DIAGNOSIS — Z992 Dependence on renal dialysis: Secondary | ICD-10-CM | POA: Diagnosis not present

## 2015-06-05 DIAGNOSIS — D631 Anemia in chronic kidney disease: Secondary | ICD-10-CM | POA: Diagnosis not present

## 2015-06-05 DIAGNOSIS — N186 End stage renal disease: Secondary | ICD-10-CM | POA: Diagnosis not present

## 2015-06-05 LAB — COMPREHENSIVE METABOLIC PANEL
ALBUMIN: 4.4 g/dL (ref 3.5–5.0)
ALT: 12 U/L — AB (ref 14–54)
AST: 21 U/L (ref 15–41)
Alkaline Phosphatase: 76 U/L (ref 38–126)
Anion gap: 16 — ABNORMAL HIGH (ref 5–15)
BILIRUBIN TOTAL: 1 mg/dL (ref 0.3–1.2)
BUN: 27 mg/dL — AB (ref 6–20)
CO2: 28 mmol/L (ref 22–32)
CREATININE: 6.89 mg/dL — AB (ref 0.44–1.00)
Calcium: 9.3 mg/dL (ref 8.9–10.3)
Chloride: 94 mmol/L — ABNORMAL LOW (ref 101–111)
GFR calc Af Amer: 9 mL/min — ABNORMAL LOW (ref >60)
GFR, EST NON AFRICAN AMERICAN: 7 mL/min — AB (ref >60)
GLUCOSE: 107 mg/dL — AB (ref 65–99)
Potassium: 4.3 mmol/L (ref 3.5–5.1)
Sodium: 138 mmol/L (ref 135–145)
TOTAL PROTEIN: 9.2 g/dL — AB (ref 6.5–8.1)

## 2015-06-05 LAB — LIPASE, BLOOD: Lipase: 32 U/L (ref 11–51)

## 2015-06-05 LAB — CBC
HEMATOCRIT: 38.2 % (ref 36.0–46.0)
Hemoglobin: 12.8 g/dL (ref 12.0–15.0)
MCH: 29.9 pg (ref 26.0–34.0)
MCHC: 33.5 g/dL (ref 30.0–36.0)
MCV: 89.3 fL (ref 78.0–100.0)
PLATELETS: 145 10*3/uL — AB (ref 150–400)
RBC: 4.28 MIL/uL (ref 3.87–5.11)
RDW: 22.5 % — AB (ref 11.5–15.5)
WBC: 9.8 10*3/uL (ref 4.0–10.5)

## 2015-06-05 MED ORDER — ONDANSETRON 4 MG PO TBDP
4.0000 mg | ORAL_TABLET | Freq: Once | ORAL | Status: AC | PRN
  Administered 2015-06-05: 4 mg via ORAL
  Filled 2015-06-05: qty 1

## 2015-06-05 NOTE — ED Notes (Signed)
Patient presents for headache and emesis since 1500 this afternoon. Patient was at dialysis and had two episodes of emesis there and then developed a headache after. Reports she's had a HA after dialysis in the past. Denies visual changes. Rates pain 8/10.

## 2015-06-06 MED ORDER — FENTANYL CITRATE (PF) 100 MCG/2ML IJ SOLN
100.0000 ug | Freq: Once | INTRAMUSCULAR | Status: AC
  Administered 2015-06-06: 100 ug via INTRAMUSCULAR
  Filled 2015-06-06: qty 2

## 2015-06-06 MED ORDER — HYDROCODONE-ACETAMINOPHEN 5-325 MG PO TABS: 1.0000 | ORAL_TABLET | Freq: Four times a day (QID) | ORAL | Status: DC | PRN

## 2015-06-06 NOTE — ED Provider Notes (Signed)
CSN: [id number]     Arrival date & time 06/05/15  2010 History  By signing my name below, I, Kerry Cook, attest that this documentation has been prepared under the direction and in the presence of Shanon Rosser, MD. Electronically Signed: Irene Cook, ED Scribe. 06/06/2015. 12:21 AM.  Chief Complaint  Patient presents with  . Headache   The history is provided by the patient. No language interpreter was used.   HPI Comments: Kerry Cook is a 29 y.o. female who presents to the Emergency Department complaining of gradually worsening, throbbing right-sided headache onset 9 hours ago. She rates her pain as a 9 out of 10. Pt reports that her headache started after receiving dialysis and she typically experiences a headache after dialysis. This is the first time it was severe enough to seek care. She reports associated nausea, vomiting and photophobia. Pt reports that she was given Zofran upon arriving to the ED with relief of her nausea.  Past Medical History  Diagnosis Date  . Hypertension   . History of kidney transplant 2012    kidney failure due to hypertension  . Chronic kidney disease     previous hx dialysis  . Dialysis patient [facility])    Past Surgical History  Procedure Laterality Date  . Kidney transplant Bilateral 2012   No family history on file. Social History  Substance Use Topics  . Smoking status: Never Smoker   . Smokeless tobacco: Never Used  . Alcohol Use: No   OB History    Gravida Para Term Preterm AB TAB SAB Ectopic Multiple Living   1    1  1    0     Review of Systems 10 Systems reviewed and all are negative for acute change except as noted in the HPI.   Allergies  Review of patient's allergies indicates no known allergies.  Home Medications   Prior to Admission medications   Medication Sig Start Date End Date Taking? Authorizing Provider  amLODipine (NORVASC) 10 MG tablet Take 10 mg by mouth daily.   Yes Historical Provider, MD  losartan  (COZAAR) 100 MG tablet Take 1 tablet (100 mg total) by mouth daily. 02/18/15  Yes [name], MD  predniSONE (DELTASONE) 5 MG tablet Take 5 mg by mouth daily with breakfast.   Yes Historical Provider, MD  tacrolimus (PROGRAF) 1 MG capsule Take 3 mg by mouth 2 (two) times daily.    Yes Historical Provider, MD  ibuprofen (ADVIL,MOTRIN) 200 MG tablet Take 200 mg by mouth every 6 (six) hours as needed for moderate pain.     Historical Provider, MD  labetalol (NORMODYNE) 300 MG tablet Take 1 tablet (300 mg total) by mouth 3 (three) times daily. 2 tabs am (600 mg), 1 tab noon (300 mg), 2 tabs pm (600 mg) Patient not taking: Reported on 06/05/2015 02/18/15   [name], MD  medroxyPROGESTERone (DEPO-PROVERA) 150 MG/ML injection Inject 1 mL (150 mg total) into the muscle every 3 (three) months. Patient not taking: Reported on 03/21/2015 12/06/14   Shelly Bombard, MD   BP 196/126 mmHg  Pulse 90  Temp(Src) 98.5 F (36.9 C) (Oral)  Resp 25  SpO2 100%  LMP 06/05/2015   Physical Exam General: Well-developed, well-nourished female in no acute distress; appearance consistent with age of record HENT: normocephalic; atraumatic Eyes: pupils equal, round and reactive to light; extraocular muscles intact; photophobia Neck: supple Heart: regular rate and rhythm Lungs: clear to auscultation bilaterally Abdomen: soft; nondistended; nontender; bowel  sounds present Extremities: No deformity; full range of motion; pulses normal; dialysis fistula left forearm with pulse and thrill Neurologic: Awake, alert and oriented; motor function intact in all extremities and symmetric; no facial droop Skin: Warm and dry Psychiatric: Flat affect  ED Course  Procedures (including critical care time)   MDM  1:16 AM Headache improved after IM fentanyl. Patient will follow up with her PCP regarding her recurrent headaches.  Final diagnoses:  Bad headache   I personally performed the services described in this  documentation, which was scribed in my presence. The recorded information has been reviewed and is accurate.    Shanon Rosser, MD 06/06/15 [phone]

## 2015-06-06 NOTE — ED Notes (Signed)
Pt is aware that a urine sample is needed, but sts she is a dialysis pt and therefore unable to urinate at this time.

## 2015-06-07 ENCOUNTER — Observation Stay (HOSPITAL_COMMUNITY)
Admission: EM | Admit: 2015-06-07 | Discharge: 2015-06-09 | Disposition: A | Discharge status: Home / Self Care | Payer: Medicare Other | Attending: Internal Medicine | Admitting: Internal Medicine

## 2015-06-07 ENCOUNTER — Emergency Department (HOSPITAL_COMMUNITY): Payer: Medicare Other

## 2015-06-07 ENCOUNTER — Encounter (HOSPITAL_COMMUNITY): Payer: Self-pay

## 2015-06-07 DIAGNOSIS — R519 Headache, unspecified: Secondary | ICD-10-CM | POA: Diagnosis present

## 2015-06-07 DIAGNOSIS — Z940 Kidney transplant status: Secondary | ICD-10-CM | POA: Diagnosis not present

## 2015-06-07 DIAGNOSIS — I120 Hypertensive chronic kidney disease with stage 5 chronic kidney disease or end stage renal disease: Secondary | ICD-10-CM | POA: Diagnosis not present

## 2015-06-07 DIAGNOSIS — Z7952 Long term (current) use of systemic steroids: Secondary | ICD-10-CM | POA: Diagnosis not present

## 2015-06-07 DIAGNOSIS — N189 Chronic kidney disease, unspecified: Secondary | ICD-10-CM

## 2015-06-07 DIAGNOSIS — T8612 Kidney transplant failure: Secondary | ICD-10-CM | POA: Diagnosis not present

## 2015-06-07 DIAGNOSIS — R109 Unspecified abdominal pain: Secondary | ICD-10-CM

## 2015-06-07 DIAGNOSIS — N2581 Secondary hyperparathyroidism of renal origin: Secondary | ICD-10-CM | POA: Diagnosis not present

## 2015-06-07 DIAGNOSIS — Z79899 Other long term (current) drug therapy: Secondary | ICD-10-CM | POA: Diagnosis not present

## 2015-06-07 DIAGNOSIS — Z992 Dependence on renal dialysis: Secondary | ICD-10-CM | POA: Insufficient documentation

## 2015-06-07 DIAGNOSIS — D509 Iron deficiency anemia, unspecified: Secondary | ICD-10-CM | POA: Diagnosis not present

## 2015-06-07 DIAGNOSIS — R030 Elevated blood-pressure reading, without diagnosis of hypertension: Secondary | ICD-10-CM | POA: Diagnosis not present

## 2015-06-07 DIAGNOSIS — N186 End stage renal disease: Secondary | ICD-10-CM

## 2015-06-07 DIAGNOSIS — D631 Anemia in chronic kidney disease: Secondary | ICD-10-CM | POA: Diagnosis present

## 2015-06-07 DIAGNOSIS — R51 Headache: Secondary | ICD-10-CM | POA: Diagnosis not present

## 2015-06-07 DIAGNOSIS — I160 Hypertensive urgency: Secondary | ICD-10-CM | POA: Diagnosis not present

## 2015-06-07 LAB — CBC WITH DIFFERENTIAL/PLATELET
BASOS ABS: 0.1 10*3/uL (ref 0.0–0.1)
Basophils Relative: 1 %
EOS ABS: 0.1 10*3/uL (ref 0.0–0.7)
Eosinophils Relative: 1 %
HCT: 38.7 % (ref 36.0–46.0)
Hemoglobin: 12.3 g/dL (ref 12.0–15.0)
Lymphocytes Relative: 29 %
Lymphs Abs: 2.3 10*3/uL (ref 0.7–4.0)
MCH: 29.5 pg (ref 26.0–34.0)
MCHC: 31.8 g/dL (ref 30.0–36.0)
MCV: 92.8 fL (ref 78.0–100.0)
MONO ABS: 0.6 10*3/uL (ref 0.1–1.0)
Monocytes Relative: 7 %
NEUTROS PCT: 62 %
Neutro Abs: 4.8 10*3/uL (ref 1.7–7.7)
PLATELETS: 138 10*3/uL — AB (ref 150–400)
RBC: 4.17 MIL/uL (ref 3.87–5.11)
RDW: 22.5 % — ABNORMAL HIGH (ref 11.5–15.5)
WBC: 7.9 10*3/uL (ref 4.0–10.5)

## 2015-06-07 LAB — BASIC METABOLIC PANEL
ANION GAP: 15 (ref 5–15)
BUN: 26 mg/dL — AB (ref 6–20)
CO2: 28 mmol/L (ref 22–32)
Calcium: 9.6 mg/dL (ref 8.9–10.3)
Chloride: 95 mmol/L — ABNORMAL LOW (ref 101–111)
Creatinine, Ser: 8.12 mg/dL — ABNORMAL HIGH (ref 0.44–1.00)
GFR calc Af Amer: 7 mL/min — ABNORMAL LOW (ref >60)
GFR, EST NON AFRICAN AMERICAN: 6 mL/min — AB (ref >60)
Glucose, Bld: 64 mg/dL — ABNORMAL LOW (ref 65–99)
POTASSIUM: 5 mmol/L (ref 3.5–5.1)
SODIUM: 138 mmol/L (ref 135–145)

## 2015-06-07 MED ORDER — TACROLIMUS 1 MG PO CAPS
3.0000 mg | ORAL_CAPSULE | Freq: Two times a day (BID) | ORAL | Status: DC
  Administered 2015-06-07 – 2015-06-09 (×4): 3 mg via ORAL
  Filled 2015-06-07 (×4): qty 3

## 2015-06-07 MED ORDER — ACETAMINOPHEN 325 MG PO TABS
650.0000 mg | ORAL_TABLET | Freq: Four times a day (QID) | ORAL | Status: DC | PRN
Start: 2015-06-07 — End: 2015-06-09
  Administered 2015-06-09: 650 mg via ORAL
  Filled 2015-06-07: qty 2

## 2015-06-07 MED ORDER — HYDROCODONE-ACETAMINOPHEN 5-325 MG PO TABS
1.0000 | ORAL_TABLET | Freq: Four times a day (QID) | ORAL | Status: DC | PRN
  Administered 2015-06-07 – 2015-06-09 (×4): 1 via ORAL
  Filled 2015-06-07 (×3): qty 1

## 2015-06-07 MED ORDER — HYDRALAZINE HCL 50 MG PO TABS
50.0000 mg | ORAL_TABLET | Freq: Two times a day (BID) | ORAL | Status: DC
  Administered 2015-06-07 – 2015-06-09 (×4): 50 mg via ORAL
  Filled 2015-06-07 (×4): qty 1

## 2015-06-07 MED ORDER — METOCLOPRAMIDE HCL 5 MG/ML IJ SOLN
5.0000 mg | Freq: Once | INTRAMUSCULAR | Status: AC
  Administered 2015-06-07: 5 mg via INTRAMUSCULAR
  Filled 2015-06-07: qty 2

## 2015-06-07 MED ORDER — PREDNISONE 5 MG PO TABS
5.0000 mg | ORAL_TABLET | Freq: Every day | ORAL | Status: DC
  Administered 2015-06-08 – 2015-06-09 (×2): 5 mg via ORAL
  Filled 2015-06-07 (×2): qty 1

## 2015-06-07 MED ORDER — ACETAMINOPHEN 650 MG RE SUPP: 650.0000 mg | Freq: Four times a day (QID) | RECTAL | Status: DC | PRN

## 2015-06-07 MED ORDER — DIPHENHYDRAMINE HCL 25 MG PO CAPS
25.0000 mg | ORAL_CAPSULE | Freq: Once | ORAL | Status: AC
  Administered 2015-06-07: 25 mg via ORAL
  Filled 2015-06-07: qty 1

## 2015-06-07 MED ORDER — SODIUM CHLORIDE 0.9 % IJ SOLN
3.0000 mL | Freq: Two times a day (BID) | INTRAMUSCULAR | Status: DC
  Administered 2015-06-07 – 2015-06-09 (×4): 3 mL via INTRAVENOUS

## 2015-06-07 MED ORDER — LOSARTAN POTASSIUM 50 MG PO TABS
100.0000 mg | ORAL_TABLET | Freq: Every day | ORAL | Status: DC
  Administered 2015-06-08 – 2015-06-09 (×2): 100 mg via ORAL
  Filled 2015-06-07 (×2): qty 2

## 2015-06-07 MED ORDER — ONDANSETRON HCL 4 MG/2ML IJ SOLN: 4.0000 mg | Freq: Three times a day (TID) | INTRAMUSCULAR | Status: DC | PRN

## 2015-06-07 MED ORDER — HYDRALAZINE HCL 20 MG/ML IJ SOLN
10.0000 mg | INTRAMUSCULAR | Status: DC | PRN
  Administered 2015-06-07: 10 mg via INTRAVENOUS
  Filled 2015-06-07: qty 1

## 2015-06-07 MED ORDER — HYDRALAZINE HCL 20 MG/ML IJ SOLN
10.0000 mg | INTRAMUSCULAR | Status: DC | PRN
  Filled 2015-06-07: qty 1

## 2015-06-07 MED ORDER — LABETALOL HCL 200 MG PO TABS
200.0000 mg | ORAL_TABLET | Freq: Two times a day (BID) | ORAL | Status: DC
  Administered 2015-06-07 – 2015-06-09 (×4): 200 mg via ORAL
  Filled 2015-06-07 (×4): qty 1

## 2015-06-07 MED ORDER — CALCIUM ACETATE (PHOS BINDER) 667 MG PO CAPS
2001.0000 mg | ORAL_CAPSULE | Freq: Three times a day (TID) | ORAL | Status: DC
  Administered 2015-06-08: 2001 mg via ORAL
  Filled 2015-06-07 (×2): qty 3

## 2015-06-07 MED ORDER — ONDANSETRON HCL 4 MG PO TABS: 4.0000 mg | ORAL_TABLET | Freq: Four times a day (QID) | ORAL | Status: DC | PRN

## 2015-06-07 MED ORDER — AMLODIPINE BESYLATE 10 MG PO TABS
10.0000 mg | ORAL_TABLET | Freq: Every day | ORAL | Status: DC
  Administered 2015-06-08 – 2015-06-09 (×2): 10 mg via ORAL
  Filled 2015-06-07 (×2): qty 1

## 2015-06-07 MED ORDER — FENTANYL CITRATE (PF) 100 MCG/2ML IJ SOLN
100.0000 ug | Freq: Once | INTRAMUSCULAR | Status: AC
  Administered 2015-06-07: 100 ug via INTRAVENOUS
  Filled 2015-06-07: qty 2

## 2015-06-07 MED ORDER — ONDANSETRON HCL 4 MG/2ML IJ SOLN: 4.0000 mg | Freq: Four times a day (QID) | INTRAMUSCULAR | Status: DC | PRN

## 2015-06-07 NOTE — H&P (Signed)
[facility] History and Physical  [name] [id number] DOB: 1986/03/25 DOA: 06/07/2015  Referring physician: Mr.Giepple. PCP: No PCP Per Patient  Specialists: Nephrologist.  Chief Complaint: Headache and high blood pressure.  HPI: Kerry Cook is a 29 y.o. female with history of hypertension, ESRD more failed renal transplant presents to the ER because of headache and elevated blood pressure. Patient has had her dialysis today following which patient was referred to the ER because of high blood pressure and headache. Patient has been having headache in the right frontal and temporal area since morning. Patient's blood pressures found to be more than [id number] systolic. Patient's labetalol was recently discontinued due to nausea 2 weeks ago. In the ER nephrologist was consulted and patient was restarted on patient's labetalol 200 mg by mouth twice a day with hydralazine when necessary. On my exam patient appears nonfocal and states her headache is largely improved. Patient has been admitted for further observation. Denies any chest pain or shortness of breath.  Review of Systems: As presented in the history of presenting illness, rest negative.  Past Medical History  Diagnosis Date  . Hypertension   . History of kidney transplant 2012    kidney failure due to hypertension  . Chronic kidney disease     previous hx dialysis  . Dialysis patient [facility])    Past Surgical History  Procedure Laterality Date  . Kidney transplant Bilateral 2012   Social History:  reports that she has never smoked. She has never used smokeless tobacco. She reports that she does not drink alcohol or use illicit drugs. Where does patient live home. Can patient participate in ADLs? Yes.  No Known Allergies  Family History:  Family History  Problem Relation Age of Onset  . Family history unknown: Yes      Prior to Admission medications   Medication Sig Start Date End Date Taking?  Authorizing Provider  amLODipine (NORVASC) 10 MG tablet Take 10 mg by mouth daily.   Yes Historical Provider, MD  calcium acetate (PHOSLO) 667 MG capsule Take 2,001 mg by mouth 3 (three) times daily with meals. 05/29/15  Yes Historical Provider, MD  carvedilol (COREG) 12.5 MG tablet Take 12.5 mg by mouth 2 (two) times daily. 05/20/15  Yes Historical Provider, MD  hydrALAZINE (APRESOLINE) 50 MG tablet Take 50 mg by mouth 2 (two) times daily. 06/01/15  Yes Historical Provider, MD  HYDROcodone-acetaminophen (NORCO) 5-325 MG tablet Take 1 tablet by mouth every 6 (six) hours as needed (for pain). 06/06/15  Yes John Molpus, MD  losartan (COZAAR) 100 MG tablet Take 1 tablet (100 mg total) by mouth daily. 02/18/15  Yes [name], MD  predniSONE (DELTASONE) 5 MG tablet Take 5 mg by mouth daily with breakfast.   Yes Historical Provider, MD  tacrolimus (PROGRAF) 1 MG capsule Take 3 mg by mouth 2 (two) times daily.    Yes Historical Provider, MD    Physical Exam: Filed Vitals:   06/07/15 1915 06/07/15 2000 06/07/15 2015 06/07/15 2046  BP: 160/102 162/117 152/103 179/107  Pulse: 87 87 94 92  Temp:    98.5 F (36.9 C)  TempSrc:    Oral  Resp: 15 15 28 14   Height:      Weight:      SpO2: 97% 97% 97% 99%     General:  Moderately built and nourished.  Eyes: Anicteric no pallor.  ENT: No discharge from the ears eyes nose or mouth.  Neck: No mass felt. No  neck rigidity.  Cardiovascular: S1-S2 heard.  Respiratory: No rhonchi or crepitations.  Abdomen: Soft nontender bowel sounds present.  Skin: No rash.  Musculoskeletal: No edema.  Psychiatric: Appears normal.  Neurologic: Alert awake oriented to time place and person. Moves all extremities.  Labs on Admission:  Basic Metabolic Panel:  Recent Labs Lab 06/05/15 2110 06/07/15 1652  NA 138 138  K 4.3 5.0  CL 94* 95*  CO2 28 28  GLUCOSE 107* 64*  BUN 27* 26*  CREATININE 6.89* 8.12*  CALCIUM 9.3 9.6   Liver Function  Tests:  Recent Labs Lab 06/05/15 2110  AST 21  ALT 12*  ALKPHOS 76  BILITOT 1.0  PROT 9.2*  ALBUMIN 4.4    Recent Labs Lab 06/05/15 2110  LIPASE 32   No results for input(s): AMMONIA in the last 168 hours. CBC:  Recent Labs Lab 06/05/15 2110 06/07/15 1652  WBC 9.8 7.9  NEUTROABS  --  4.8  HGB 12.8 12.3  HCT 38.2 38.7  MCV 89.3 92.8  PLT 145* 138*   Cardiac Enzymes: No results for input(s): CKTOTAL, CKMB, CKMBINDEX, TROPONINI in the last 168 hours.  BNP (last 3 results) No results for input(s): BNP in the last 8760 hours.  ProBNP (last 3 results) No results for input(s): PROBNP in the last 8760 hours.  CBG: No results for input(s): GLUCAP in the last 168 hours.  Radiological Exams on Admission: Ct Head Wo Contrast  06/07/2015  CLINICAL DATA:  Headache and photophobia with nausea EXAM: CT HEAD WITHOUT CONTRAST TECHNIQUE: Contiguous axial images were obtained from the base of the skull through the vertex without intravenous contrast. COMPARISON:  August 25, 2007 FINDINGS: The ventricles are normal in size and configuration. There is no intracranial mass, hemorrhage, extra-axial fluid collection, or midline shift. Gray-white compartments are normal. No acute infarct evident. Bony calvarium appears intact. The mastoid air cells are clear. No intraorbital lesions are identified. IMPRESSION: Study within normal limits. Electronically Signed   By: Lowella Grip III M.D.   On: 06/07/2015 17:45    EKG: Independently reviewed. Normal sinus rhythm with LVH. Prolonged QT.  Assessment/Plan Principal Problem:   Hypertensive urgency Active Problems:   Anemia in chronic kidney disease   End stage renal disease on dialysis ([place])   Headache   1. Hypertensive urgency - appreciate nephrology consult. Patient has been placed back on labetalol 200 mg by mouth twice a day. Continue when necessary IV hydralazine and closely follow blood pressure trends. Continue  amlodipine. 2. ESRD on hemodialysis on Tuesday Thursdays and Saturday - patient does not appear to be in fluid overload. Follow metabolic panel. 3. Headache probably from uncontrolled blood pressure improved after blood pressure is getting reasonably controlled. 4. 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  I have reviewed patient's old charts and labs.   DVT Prophylaxis SCDs.  Code Status: Full code.  Family Communication: Discussed with patient.  Disposition Plan: Admit for observation.    Mylea Roarty N. [facility] Pager [phone].  If 7PM-7AM, please contact night-coverage [email or web address] Password [email or web address] 06/07/2015, 10:15 PM

## 2015-06-07 NOTE — Consult Note (Signed)
Renal Service Consult Note [facility]  Kerry Cook 06/07/2015 Kerry Cook Requesting Physician:  Dr Kerry Cook  Reason for Consult:  ESRD patient with HA and uncont HTN HPI: The patient is a 29 y.o. 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  Was vomiting Monday but not today.  No chills,fever, SOB, chest pain, no abd pain.  She goes to all her hd sessions. She says her BP was OK until labetalol was stopped due to n/v a few weeks ago. She had been on it for many years at 200 mg bid.  Switched to coreg and now her BP is very high, 202/.139 right now.  Had only 2kg on today pre HD and got 1/2 of her HD. Last few HD sessions reviewed , very high BP's 200/120 range and some higher.    Started dialysis originally around 2010, then got a renal transplant in 2012 at [facility]. The transplant failed around April this year and she started back on hemodialysis. Has a left arm fistula.  Still takes prograf and prednisone at low dose with plans to taper off. 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  Per charge nurse at OP HD unit Kerry Cook has been wanting to have her seen over there regarding possible transplant kidney removal but patient has not been keeping her appts there.    ROS  No CP, SOB, no abd pain, no fevers recently . No wt loss, n/v/d. No joint pains. +HA  Past Medical History  Past Medical History  Diagnosis Date  . Hypertension   . History of kidney transplant 2012    kidney failure due to hypertension  . Chronic kidney disease     previous hx dialysis  . Dialysis patient [facility])    Past Surgical History  Past Surgical History  Procedure Laterality Date  . Kidney transplant Bilateral 2012   Family History History reviewed. No pertinent family history. Social  History  reports that she has never smoked. She has never used smokeless tobacco. She reports that she does not drink alcohol or use illicit drugs. Allergies No Known Allergies Home medications Prior to Admission medications   Medication Sig Start Date End Date Taking? Authorizing Provider  amLODipine (NORVASC) 10 MG tablet Take 10 mg by mouth daily.   Yes Historical Provider, MD  calcium acetate (PHOSLO) 667 MG capsule Take 2,001 mg by mouth 3 (three) times daily with meals. 05/29/15  Yes Historical Provider, MD  carvedilol (COREG) 12.5 MG tablet Take 12.5 mg by mouth 2 (two) times daily. 05/20/15  Yes Historical Provider, MD  hydrALAZINE (APRESOLINE) 50 MG tablet Take 50 mg by mouth 2 (two) times daily. 06/01/15  Yes Historical Provider, MD  HYDROcodone-acetaminophen (NORCO) 5-325 MG tablet Take 1 tablet by mouth every 6 (six) hours as needed (for pain). 06/06/15  Yes Kerry Molpus, MD  losartan (COZAAR) 100 MG tablet Take 1 tablet (100 mg total) by mouth daily. 02/18/15  Yes Kerry Finders, MD  predniSONE (DELTASONE) 5 MG tablet Take 5 mg by mouth daily with breakfast.   Yes Historical Provider, MD  tacrolimus (PROGRAF) 1 MG capsule Take 3 mg by mouth 2 (two) times daily.    Yes Historical Provider, MD   Liver Function Tests  Recent Labs Lab 06/05/15 2110  AST 21  ALT  12*  ALKPHOS 76  BILITOT 1.0  PROT 9.2*  ALBUMIN 4.4    Recent Labs Lab 06/05/15 2110  LIPASE 32   CBC  Recent Labs Lab 06/05/15 2110  WBC 9.8  HGB 12.8  HCT 38.2  MCV 89.3  PLT [id number]*   Basic Metabolic Panel  Recent Labs Lab 06/05/15 2110  NA 138  K 4.3  CL 94*  CO2 28  GLUCOSE 107*  BUN 27*  CREATININE 6.89*  CALCIUM 9.3    Filed Vitals:   06/07/15 1511 06/07/15 1515 06/07/15 1530  BP: 187/129 188/121 197/127  Pulse: 105 114 113  Temp: 98.2 F (36.8 C)    TempSrc: Oral    Resp: 18 19 32  Height: 5\' 1"  (1.549 m)    Weight: 56.7 kg (125 lb)    SpO2: 99% 98% 95%   Exam Alert no distress,  calm No rash, cyanosis or gangrene Sclera anicteric, throat clear No jvd Chest clear bilat RRR soft SEM no RG abd soft ntnd no mass or ascites Ext no edema or wounds LFA AVF +bruit Neuro is nf, Ox 3  [name] MWF 3h 72min  Assessment: 1 Uncontrolled HTN, for admit to medicine service 2 Headaches 3 Failed renall transplant; recent history of 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  Wonder if transplant rejection may be driving HTN in some way.    Kerry Splinter MD [employer] pager [phone]    cell [phone] 06/07/2015, 4:14 PM

## 2015-06-07 NOTE — ED Notes (Signed)
Pt presents with 3 day h/o headache.  Pt reports she awakens with pain to R temporal area, was seen at [facility] with resolution of pain with injection given.  Pt reports pain returned yesterday and was resolved with vicodin, only to return again this morning.  -nausea, +photophobia- no h/o migraines.  Only received 1/2 of HD treatment today.  Pt reports her BP medication of labetalol was changed last week (med made her nauseated).

## 2015-06-07 NOTE — ED Provider Notes (Signed)
CSN: [id number]     Arrival date & time 06/07/15  1459 History   First MD Initiated Contact with Patient 06/07/15 1525     Chief Complaint  Patient presents with  . Headache     (Consider location/radiation/quality/duration/timing/severity/associated sxs/prior Treatment) HPI Comments: Patient with history of end-stage renal disease, on dialysis since 10/2014 at [facility] -- presents with continued headache which she describes as severe, right temporal to occipital, with radiation into her neck. No pain with movement of her neck. She has not had associated photophobia and phonophobia. No vomiting. She has not had fever or weakness in extremities, difficulty walking. Patient could not complete dialysis today (completed 2 hours) because of pain. Her blood pressure was high and she was given 0.1 mg of clonidine without improvement. She was transported to the hospital for further evaluation and treatment. Patient was seen in emergency department 2 days ago and received IM fentanyl with some relief. She states upon returning home, her headache returned. The onset of this condition was acute. The course is constant.  Patient is a 29 y.o. female presenting with headaches. The history is provided by the patient and medical records.  Headache Associated symptoms: no congestion, no fever, no nausea, no neck pain, no neck stiffness, no numbness, no photophobia, no sinus pressure, no vomiting and no weakness     Past Medical History  Diagnosis Date  . Hypertension   . History of kidney transplant 2012    kidney failure due to hypertension  . Chronic kidney disease     previous hx dialysis  . Dialysis patient [facility])    Past Surgical History  Procedure Laterality Date  . Kidney transplant Bilateral 2012   History reviewed. No pertinent family history. Social History  Substance Use Topics  . Smoking status: Never Smoker   . Smokeless tobacco: Never Used  . Alcohol Use: No   OB History     Gravida Para Term Preterm AB TAB SAB Ectopic Multiple Living   1    1  1    0     Review of Systems  Constitutional: Negative for fever.  HENT: Negative for congestion, dental problem, rhinorrhea and sinus pressure.   Eyes: Negative for photophobia, discharge, redness and visual disturbance.  Respiratory: Negative for shortness of breath.   Cardiovascular: Negative for chest pain.  Gastrointestinal: Negative for nausea and vomiting.  Musculoskeletal: Negative for gait problem, neck pain and neck stiffness.  Skin: Negative for rash.  Neurological: Positive for headaches. Negative for syncope, speech difficulty, weakness, light-headedness and numbness.  Psychiatric/Behavioral: Negative for confusion.      Allergies  Review of patient's allergies indicates no known allergies.  Home Medications   Prior to Admission medications   Medication Sig Start Date End Date Taking? Authorizing Provider  amLODipine (NORVASC) 10 MG tablet Take 10 mg by mouth daily.   Yes Historical Provider, MD  calcium acetate (PHOSLO) 667 MG capsule Take 2,001 mg by mouth 3 (three) times daily with meals. 05/29/15  Yes Historical Provider, MD  carvedilol (COREG) 12.5 MG tablet Take 12.5 mg by mouth 2 (two) times daily. 05/20/15  Yes Historical Provider, MD  hydrALAZINE (APRESOLINE) 50 MG tablet Take 50 mg by mouth 2 (two) times daily. 06/01/15  Yes Historical Provider, MD  HYDROcodone-acetaminophen (NORCO) 5-325 MG tablet Take 1 tablet by mouth every 6 (six) hours as needed (for pain). 06/06/15  Yes John Molpus, MD  losartan (COZAAR) 100 MG tablet Take 1 tablet (100 mg total) by  mouth daily. 02/18/15  Yes [name], MD  predniSONE (DELTASONE) 5 MG tablet Take 5 mg by mouth daily with breakfast.   Yes Historical Provider, MD  tacrolimus (PROGRAF) 1 MG capsule Take 3 mg by mouth 2 (two) times daily.    Yes Historical Provider, MD   BP 188/121 mmHg  Pulse 114  Temp(Src) 98.2 F (36.8 C) (Oral)  Resp 19  Ht 5\' 1"   (1.549 m)  Wt 56.7 kg  BMI 23.63 kg/m2  SpO2 98%  LMP 06/07/2015 Physical Exam  Constitutional: She is oriented to person, place, and time. She appears well-developed and well-nourished.  HENT:  Head: Normocephalic and atraumatic.  Right Ear: Tympanic membrane, external ear and ear canal normal.  Left Ear: Tympanic membrane, external ear and ear canal normal.  Nose: Nose normal.  Mouth/Throat: Uvula is midline, oropharynx is clear and moist and mucous membranes are normal.  Eyes: Conjunctivae, EOM and lids are normal. Pupils are equal, round, and reactive to light. Right eye exhibits no nystagmus. Left eye exhibits no nystagmus.  Neck: Normal range of motion. Neck supple.  Cardiovascular: Normal rate and regular rhythm.   Pulmonary/Chest: Effort normal and breath sounds normal.  Abdominal: Soft. There is no tenderness.  Musculoskeletal:       Cervical back: She exhibits normal range of motion, no tenderness and no bony tenderness.  Neurological: She is alert and oriented to person, place, and time. She has normal strength and normal reflexes. No cranial nerve deficit or sensory deficit. She displays a negative Romberg sign. Coordination and gait normal. GCS eye subscore is 4. GCS verbal subscore is 5. GCS motor subscore is 6.  Skin: Skin is warm and dry.  Left upper extremity fistula.  Psychiatric: She has a normal mood and affect.  Nursing note and vitals reviewed.   ED Course  Procedures (including critical care time) Labs Review Labs Reviewed  CBC WITH DIFFERENTIAL/PLATELET - Abnormal; Notable for the following:    RDW 22.5 (*)    Platelets 138 (*)    All other components within normal limits  BASIC METABOLIC PANEL - Abnormal; Notable for the following:    Chloride 95 (*)    Glucose, Bld 64 (*)    BUN 26 (*)    Creatinine, Ser 8.12 (*)    GFR calc non Af Amer 6 (*)    GFR calc Af Amer 7 (*)    All other components within normal limits    Imaging Review Ct Head Wo  Contrast  06/07/2015  CLINICAL DATA:  Headache and photophobia with nausea EXAM: CT HEAD WITHOUT CONTRAST TECHNIQUE: Contiguous axial images were obtained from the base of the skull through the vertex without intravenous contrast. COMPARISON:  August 25, 2007 FINDINGS: The ventricles are normal in size and configuration. There is no intracranial mass, hemorrhage, extra-axial fluid collection, or midline shift. Gray-white compartments are normal. No acute infarct evident. Bony calvarium appears intact. The mastoid air cells are clear. No intraorbital lesions are identified. IMPRESSION: Study within normal limits. Electronically Signed   By: Lowella Grip III M.D.   On: 06/07/2015 17:45   I have personally reviewed and evaluated these images and lab results as part of my medical decision-making.   EKG Interpretation None       3:54 PM Patient seen and examined. Work-up initiated. Medications ordered.   Vital signs reviewed and are as follows: BP 188/121 mmHg  Pulse 114  Temp(Src) 98.2 F (36.8 C) (Oral)  Resp 19  Ht  5\' 1"  (1.549 m)  Wt 56.7 kg  BMI 23.63 kg/m2  SpO2 98%  LMP 06/07/2015  6:23 PM CT head is negative. Patient re-evaluated. HA is not improved. Will give fentanyl. BP is still elevated.    Patient and family states that Dr. Jonnie Finner states patient to be admitted.   7:25 PM I spoke with Dr. Augustin Coupe of nephrology who was not aware of patient, but agrees that she warrants admission. He asked for hospitalist to admit. They will follow patient in hospital.   HA improved with fentanyl.   Pending labs, will call for admit.   7:52 PM Spoke with Dr. Delford Field who will admit. Tele/observation requested.   MDM   Final diagnoses:  Hypertensive urgency  Acute nonintractable headache, unspecified headache type  ESRD (end stage renal disease) ([place])   Admit.     Carlisle Cater, PA-C 06/07/15 1953  Ripley Fraise, MD 06/07/15 [phone]

## 2015-06-07 NOTE — ED Notes (Signed)
Per EMS - pt halfway through dialysis, sent to [facility]. Pt has had headache since dialysis tx Monday. Pt tachycardic (120bpm) and hypertensive. Given clonidine at dialysis center. Denies nausea.

## 2015-06-07 NOTE — ED Notes (Signed)
Pt returned from CT °

## 2015-06-07 NOTE — ED Notes (Signed)
Mariann Laster [phone]; pt's aunt

## 2015-06-07 NOTE — ED Notes (Signed)
HemoD MD at bedside

## 2015-06-08 ENCOUNTER — Encounter (HOSPITAL_COMMUNITY): Payer: Self-pay | Admitting: General Practice

## 2015-06-08 ENCOUNTER — Observation Stay (HOSPITAL_COMMUNITY): Payer: Medicare Other

## 2015-06-08 DIAGNOSIS — Z992 Dependence on renal dialysis: Secondary | ICD-10-CM | POA: Diagnosis not present

## 2015-06-08 DIAGNOSIS — D631 Anemia in chronic kidney disease: Secondary | ICD-10-CM

## 2015-06-08 DIAGNOSIS — T8612 Kidney transplant failure: Secondary | ICD-10-CM | POA: Diagnosis not present

## 2015-06-08 DIAGNOSIS — R1031 Right lower quadrant pain: Secondary | ICD-10-CM | POA: Diagnosis not present

## 2015-06-08 DIAGNOSIS — N186 End stage renal disease: Secondary | ICD-10-CM | POA: Diagnosis not present

## 2015-06-08 DIAGNOSIS — Z9889 Other specified postprocedural states: Secondary | ICD-10-CM | POA: Diagnosis not present

## 2015-06-08 DIAGNOSIS — I160 Hypertensive urgency: Secondary | ICD-10-CM | POA: Diagnosis not present

## 2015-06-08 DIAGNOSIS — N189 Chronic kidney disease, unspecified: Secondary | ICD-10-CM | POA: Diagnosis not present

## 2015-06-08 DIAGNOSIS — Z940 Kidney transplant status: Secondary | ICD-10-CM | POA: Diagnosis not present

## 2015-06-08 LAB — CBC
HEMATOCRIT: 36.8 % (ref 36.0–46.0)
Hemoglobin: 11.5 g/dL — ABNORMAL LOW (ref 12.0–15.0)
MCH: 29.3 pg (ref 26.0–34.0)
MCHC: 31.3 g/dL (ref 30.0–36.0)
MCV: 93.9 fL (ref 78.0–100.0)
Platelets: 126 10*3/uL — ABNORMAL LOW (ref 150–400)
RBC: 3.92 MIL/uL (ref 3.87–5.11)
RDW: 22.5 % — AB (ref 11.5–15.5)
WBC: 7.3 10*3/uL (ref 4.0–10.5)

## 2015-06-08 LAB — BASIC METABOLIC PANEL
Anion gap: 15 (ref 5–15)
BUN: 37 mg/dL — AB (ref 6–20)
CHLORIDE: 94 mmol/L — AB (ref 101–111)
CO2: 28 mmol/L (ref 22–32)
Calcium: 9.6 mg/dL (ref 8.9–10.3)
Creatinine, Ser: 10.68 mg/dL — ABNORMAL HIGH (ref 0.44–1.00)
GFR calc Af Amer: 5 mL/min — ABNORMAL LOW (ref >60)
GFR calc non Af Amer: 4 mL/min — ABNORMAL LOW (ref >60)
GLUCOSE: 61 mg/dL — AB (ref 65–99)
POTASSIUM: 4.7 mmol/L (ref 3.5–5.1)
Sodium: 137 mmol/L (ref 135–145)

## 2015-06-08 LAB — HCG, SERUM, QUALITATIVE: PREG SERUM: NEGATIVE

## 2015-06-08 MED ORDER — RENA-VITE PO TABS
1.0000 | ORAL_TABLET | Freq: Every day | ORAL | Status: DC
  Administered 2015-06-08: 1 via ORAL
  Filled 2015-06-08: qty 1

## 2015-06-08 MED ORDER — LABETALOL HCL 200 MG PO TABS: 200.0000 mg | ORAL_TABLET | Freq: Two times a day (BID) | ORAL | Status: DC

## 2015-06-08 MED ORDER — DOXERCALCIFEROL 4 MCG/2ML IV SOLN
2.0000 ug | INTRAVENOUS | Status: DC
  Administered 2015-06-09: 2 ug via INTRAVENOUS
  Filled 2015-06-08: qty 2

## 2015-06-08 NOTE — Progress Notes (Signed)
[facility] Progress Note   ZOELYNN PICKNEY  [id number]  DOB: 10-09-1985  DOA: 06/07/2015 PCP: No PCP Per Patient  Brief narrative: Kerry Cook is a 29 y.o. female with history of hypertension, ESRD more failed renal transplant presents to the ER because of headache and elevated blood pressure. Patient has had her dialysis today following which patient was referred to the ER because of high blood pressure and headache. Patient has been having headache in the right frontal and temporal area since morning. Patient's blood pressures found to be more than [id number] systolic. Patient's labetalol was recently discontinued due to nausea 2 weeks ago. In the ER nephrologist was consulted and patient was restarted on patient's labetalol 200 mg by mouth twice a day with hydralazine when necessary. On my exam patient appears nonfocal and states her headache is largely improved. Patient has been admitted for further observation. Denies any chest pain or shortness of breath.   Subjective: No further headache. Tolerating Labetalol without nausea.   Assessment/Plan: Principal Problem:   Hypertensive urgency - cont Labetalol, Norvasc, Hydralazine and Losartan  Active Problems:   Anemia in chronic kidney disease - follow    End stage renal disease on dialysis  - nephrology following- investigating whether her HTN is a sign of transplant rejection      Code Status:     Code Status Orders        Start     Ordered   06/07/15 2215  Full code   Continuous     06/07/15 2214     Family Communication: Disposition Plan: home when stable DVT prophylaxis: SCDs Consultants:Nephrology Procedures:   Antibiotics: Anti-infectives    None      Objective: Filed Weights   06/07/15 1511  Weight: 56.7 kg (125 lb)    Intake/Output Summary (Last 24 hours) at 06/08/15 1244 Last data filed at 06/08/15 1107  Gross per 24 hour  Intake    480 ml  Output      0 ml  Net    480 ml      Vitals Filed Vitals:   06/07/15 2046 06/08/15 0422 06/08/15 0853 06/08/15 1107  BP: 179/107 173/111 198/124 160/99  Pulse: 92 92 97 87  Temp: 98.5 F (36.9 C) 97.6 F (36.4 C) 97.9 F (36.6 C)   TempSrc: Oral Oral Oral   Resp: 14 16 18 18   Height:      Weight:      SpO2: 99% 100% 98%     Exam:  General:  Pt is alert, not in acute distress  HEENT: No icterus, No thrush, oral mucosa moist  Cardiovascular: regular rate and rhythm, S1/S2 No murmur  Respiratory: clear to auscultation bilaterally   Abdomen: Soft, +Bowel sounds, non tender, non distended, no guarding  MSK: No LE edema, cyanosis or clubbing- fistula left forearm  Data Reviewed: Basic Metabolic Panel:  Recent Labs Lab 06/05/15 2110 06/07/15 1652 06/08/15 0655  NA 138 138 137  K 4.3 5.0 4.7  CL 94* 95* 94*  CO2 28 28 28   GLUCOSE 107* 64* 61*  BUN 27* 26* 37*  CREATININE 6.89* 8.12* 10.68*  CALCIUM 9.3 9.6 9.6   Liver Function Tests:  Recent Labs Lab 06/05/15 2110  AST 21  ALT 12*  ALKPHOS 76  BILITOT 1.0  PROT 9.2*  ALBUMIN 4.4    Recent Labs Lab 06/05/15 2110  LIPASE 32   No results for input(s): AMMONIA in the last 168 hours. CBC:  Recent Labs  Lab 06/05/15 2110 06/07/15 1652 06/08/15 0655  WBC 9.8 7.9 7.3  NEUTROABS  --  4.8  --   HGB 12.8 12.3 11.5*  HCT 38.2 38.7 36.8  MCV 89.3 92.8 93.9  PLT 145* 138* 126*   Cardiac Enzymes: No results for input(s): CKTOTAL, CKMB, CKMBINDEX, TROPONINI in the last 168 hours. BNP (last 3 results) No results for input(s): BNP in the last 8760 hours.  ProBNP (last 3 results) No results for input(s): PROBNP in the last 8760 hours.  CBG: No results for input(s): GLUCAP in the last 168 hours.  No results found for this or any previous visit (from the past 240 hour(s)).   Studies: Ct Head Wo Contrast  06/07/2015  CLINICAL DATA:  Headache and photophobia with nausea EXAM: CT HEAD WITHOUT CONTRAST TECHNIQUE: Contiguous axial images  were obtained from the base of the skull through the vertex without intravenous contrast. COMPARISON:  August 25, 2007 FINDINGS: The ventricles are normal in size and configuration. There is no intracranial mass, hemorrhage, extra-axial fluid collection, or midline shift. Gray-white compartments are normal. No acute infarct evident. Bony calvarium appears intact. The mastoid air cells are clear. No intraorbital lesions are identified. IMPRESSION: Study within normal limits. Electronically Signed   By: Lowella Grip III M.D.   On: 06/07/2015 17:45    Scheduled Meds:  Scheduled Meds: . amLODipine  10 mg Oral Daily  . calcium acetate  2,001 mg Oral TID WC  . hydrALAZINE  50 mg Oral BID  . labetalol  200 mg Oral BID  . losartan  100 mg Oral Daily  . multivitamin  1 tablet Oral QHS  . predniSONE  5 mg Oral Q breakfast  . sodium chloride  3 mL Intravenous Q12H  . tacrolimus  3 mg Oral BID   Continuous Infusions:   Time spent on care of this patient: 42 min   [place], MD 06/08/2015, 12:44 PM    [facility] Office  [phone] Pager - Text Page per [email or web address] If 7PM-7AM, please contact night-coverage [email or web address]

## 2015-06-08 NOTE — Progress Notes (Signed)
Patient c/o H/A but refuses to take Tylenol.

## 2015-06-08 NOTE — Care Management Note (Signed)
Case Management Note  Patient Details  Name: SHATYRA CRIOLLO MRN: [id number] Date of Birth: 06-06-1986  Subjective/Objective:       CM following for progression and d/c planning.             Action/Plan: Per MD pt does not have a PCP and needs hospital followup. This pt has insurance therefore must select her own PCP, in the interim I have scheduled her for hospital followup at the [facility] as the [facility] has no available appointments for new patients.  Earliest available appointment is Friday, Jun 30, 2015 @ 2:15pm. Pt informed and appointment noted on d/c instructions.   Expected Discharge Date:                  Expected Discharge Plan:  Home/Self Care  In-House Referral:  NA  Discharge planning Services  CM Consult, [facility]  Post Acute Care Choice:  NA Choice offered to:  NA  DME Arranged:  N/A DME Agency:  NA  HH Arranged:  NA HH Agency:  NA  Status of Service:  Completed, signed off  Medicare Important Message Given:    Date Medicare IM Given:    Medicare IM give by:    Date Additional Medicare IM Given:    Additional Medicare Important Message give by:     If discussed at [place] of Stay Meetings, dates discussed:    Additional Comments:  Adron Bene, RN 06/08/2015, 11:10 AM

## 2015-06-08 NOTE — Progress Notes (Addendum)
[facility] Progress Note  Assessment/Plan: 1. Hypertensive urgency: Patient had been on labetolol, was stopped due to C/O abdominal pain. However, pt has been seen at [facility] for abdominal pain on 01/12/15. She was treated with solumedrol. Transplantectomy was suggested if symptoms persist. No further visits since. Continue antihypertensive meds per now.  2. ESRD - MWF at [facility]. Next scheduled treatment 06/09/15. K+ 4.7. Run on 2.0 K cath. Tight heparin, plt 126. 3. Anemia - Hgb 11.5. No ESA for now. Follow HGB. (Last dose of ESA in center 06/07/15) 4. Secondary hyperparathyroidism - Ca 9.6 C Ca 9.28, continue Ca acetate but monitor Ca, continue hectoral for PTH suppression reduce dose to 2 mcg.  5. HTN/volume -current wt 56.7, OP EDW 54.5. Attempt 2.5-3 liters tomorrow.  6. Nutrition - Albumin 4.4. Renal diet, fld restrictions. Add renal vit.  7. Failed Renal Transplant: Continue immunosuppressants.   Rita H. Brown NP-C 06/08/2015, 12:17 PM  [facility] [phone]  Pt seen, examined and agree w A/P as above. Not sure the cause of her problems, doubt that she is intolerant of labetalol. Will d/w [place] and try to get their input into any role that the transplant may be playing here. Also will consider CT scan of transplant as this is prob the most definitive way to make the dx of acute symptomatic rejection.  Kelly Splinter MD [facility] pager [phone]    cell [phone] 06/08/2015, 2:49 PM    Subjective:  "I feel so much better" Denies HA, dizziness, N & V.    Objective Filed Vitals:   06/07/15 2046 06/08/15 0422 06/08/15 0853 06/08/15 1107  BP: 179/107 173/111 198/124 160/99  Pulse: 92 92 97 87  Temp: 98.5 F (36.9 C) 97.6 F (36.4 C) 97.9 F (36.6 C)   TempSrc: Oral Oral Oral   Resp: 14 16 18 18   Height:      Weight:      SpO2: 99% 100% 98%    Physical Exam General: pleasant, cooperative NAD Heart: S1, S2, II/VI  systolic M 2nd ICS RSB no G/R Lungs: Bilateral breath sounds CTA A/P Abdomen: soft, nontender Extremities: No LE edema. Pulses intact Dialysis Access: LUA AVG + bruit. Old RUA AVF present  Dialysis Orders: [facility] MonWedFri, 3 hrs 45 min, 180NRe Optiflux, BFR 400, DFR Autoflow 1.5, EDW 54.5 (kg), Dialysate 2.0 K, 2.25 Ca,  UFR Profile: None, Sodium Model: None, Access: LUA AV Fistula Heparin: 6900 units per treatment Hectoral: 3 mcg IV q MWF (Last dose 06/07/15) Mircera 75 mcg IV q 2 weeks (last dose 06/07/15)  Additional Objective Labs: Basic Metabolic Panel:  Recent Labs Lab 06/05/15 2110 06/07/15 1652 06/08/15 0655  NA 138 138 137  K 4.3 5.0 4.7  CL 94* 95* 94*  CO2 28 28 28   GLUCOSE 107* 64* 61*  BUN 27* 26* 37*  CREATININE 6.89* 8.12* 10.68*  CALCIUM 9.3 9.6 9.6   Liver Function Tests:  Recent Labs Lab 06/05/15 2110  AST 21  ALT 12*  ALKPHOS 76  BILITOT 1.0  PROT 9.2*  ALBUMIN 4.4    Recent Labs Lab 06/05/15 2110  LIPASE 32   CBC:  Recent Labs Lab 06/05/15 2110 06/07/15 1652 06/08/15 0655  WBC 9.8 7.9 7.3  NEUTROABS  --  4.8  --   HGB 12.8 12.3 11.5*  HCT 38.2 38.7 36.8  MCV 89.3 92.8 93.9  PLT 145* 138* 126*   Blood Culture    Component Value Date/Time   SDES  URINE, CLEAN CATCH 06/26/2014 2035   SPECREQUEST None 06/26/2014 2035   CULT  06/26/2014 2035    LACTOBACILLUS SPECIES Note: Standardized susceptibility testing for this organism is not available. Performed at [facility] 06/28/2014 FINAL 06/26/2014 2035    Cardiac Enzymes: No results for input(s): CKTOTAL, CKMB, CKMBINDEX, TROPONINI in the last 168 hours. CBG: No results for input(s): GLUCAP in the last 168 hours. Iron Studies: No results for input(s): IRON, TIBC, TRANSFERRIN, FERRITIN in the last 72 hours. @lablastinr3 @ Studies/Results: Ct Head Wo Contrast  06/07/2015  CLINICAL DATA:  Headache and photophobia with nausea EXAM: CT HEAD WITHOUT  CONTRAST TECHNIQUE: Contiguous axial images were obtained from the base of the skull through the vertex without intravenous contrast. COMPARISON:  August 25, 2007 FINDINGS: The ventricles are normal in size and configuration. There is no intracranial mass, hemorrhage, extra-axial fluid collection, or midline shift. Gray-white compartments are normal. No acute infarct evident. Bony calvarium appears intact. The mastoid air cells are clear. No intraorbital lesions are identified. IMPRESSION: Study within normal limits. Electronically Signed   By: Lowella Grip III M.D.   On: 06/07/2015 17:45   Medications:   . amLODipine  10 mg Oral Daily  . calcium acetate  2,001 mg Oral TID WC  . hydrALAZINE  50 mg Oral BID  . labetalol  200 mg Oral BID  . losartan  100 mg Oral Daily  . predniSONE  5 mg Oral Q breakfast  . sodium chloride  3 mL Intravenous Q12H  . tacrolimus  3 mg Oral BID

## 2015-06-08 NOTE — Discharge Summary (Signed)
Physician Discharge Summary  Kerry Cook [id number] DOB: 04/06/1986 DOA: 06/07/2015  PCP: No PCP Per Patient- will follow up with sickle cell clinic on 06/30/15  Admit date: 06/07/2015 Discharge date: 06/08/2015  Time spent: 45 minutes  Recommendations for Outpatient Follow-up:  1. Follow-up blood pressure   Discharge Condition: Stable  Discharge Diagnoses:  Principal Problem:   Hypertensive urgency Active Problems:   Anemia in chronic kidney disease   End stage renal disease on dialysis [facility])   Headache   History of present illness:  Kerry Cook is a 29 y.o. female with history of hypertension, ESRD more failed renal transplant presents to the ER because of headache and elevated blood pressure. Patient has had her dialysis today following which patient was referred to the ER because of high blood pressure and headache. Patient has been having headache in the right frontal and temporal area since morning. Patient's blood pressures found to be more than A999333 systolic. Patient's labetalol was recently discontinued due to nausea 2 weeks ago. In the ER nephrologist was consulted and patient was restarted on patient's labetalol 200 mg by mouth twice a day with hydralazine when necessary. On my exam patient appears nonfocal and states her headache is largely improved. Patient has been admitted for further observation. Denies any chest pain or shortness of breath.  Hospital Course:  1. Hypertensive urgency-  Patient has been placed back on labetalol 200 mg by mouth twice a day. She has received 2 doses and is tolerating it well. No longer has a headache and is agreeable to resume labetalol. Will d/c [facility] 2. Headache- in the right forehead-no sinus drainage sore throat or earache-headache persisted despite improvement in blood pressure-she was frequently asking for Vicodin-was given Reglan, Vistaril and magnesium with resolution of headache 3. ESRD on hemodialysis on Tuesday  Thursdays and Saturday - patient does not appear to be in fluid overload. Follow metabolic panel. 4. History of failed renal transplant on immunosuppressants-nephrology was concerned about whether she was having rejection of her transplant-CT scan of the abdomen revealed no inflammatory process involving the kidneys   Procedures:     Consultations:  nephrology  Discharge Exam: Filed Weights   06/07/15 1511  Weight: 56.7 kg (125 lb)   Filed Vitals:   06/08/15 0853 06/08/15 1107  BP: 198/124 160/99  Pulse: 97 87  Temp: 97.9 F (36.6 C)   Resp: 18 18    General: AAO x 3, no distress Cardiovascular: RRR, no murmurs  Respiratory: clear to auscultation bilaterally GI: soft, non-tender, non-distended, bowel sound positive  Discharge Instructions You were cared for by a hospitalist during your hospital stay. If you have any questions about your discharge medications or the care you received while you were in the hospital after you are discharged, you can call the unit and asked to speak with the hospitalist on call if the hospitalist that took care of you is not available. Once you are discharged, your primary care physician will handle any further medical issues. Please note that NO REFILLS for any discharge medications will be authorized once you are discharged, as it is imperative that you return to your primary care physician (or establish a relationship with a primary care physician if you do not have one) for your aftercare needs so that they can reassess your need for medications and monitor your lab values.  Discharge Instructions    Discharge instructions    Complete by:  As directed   Low sodium, heart healthy, renal  diet     Increase activity slowly    Complete by:  As directed             Medication List    STOP taking these medications        carvedilol 12.5 MG tablet  Commonly known as:  COREG      TAKE these medications        amLODipine 10 MG tablet   Commonly known as:  NORVASC  Take 10 mg by mouth daily.     calcium acetate 667 MG capsule  Commonly known as:  PHOSLO  Take 2,001 mg by mouth 3 (three) times daily with meals.     hydrALAZINE 50 MG tablet  Commonly known as:  APRESOLINE  Take 50 mg by mouth 2 (two) times daily.     HYDROcodone-acetaminophen 5-325 MG tablet  Commonly known as:  NORCO  Take 1 tablet by mouth every 6 (six) hours as needed (for pain).     labetalol 200 MG tablet  Commonly known as:  NORMODYNE  Take 1 tablet (200 mg total) by mouth 2 (two) times daily.     losartan 100 MG tablet  Commonly known as:  COZAAR  Take 1 tablet (100 mg total) by mouth daily.     predniSONE 5 MG tablet  Commonly known as:  DELTASONE  Take 5 mg by mouth daily with breakfast.     tacrolimus 1 MG capsule  Commonly known as:  PROGRAF  Take 3 mg by mouth 2 (two) times daily.       No Known Allergies    The results of significant diagnostics from this hospitalization (including imaging, microbiology, ancillary and laboratory) are listed below for reference.    Significant Diagnostic Studies: Ct Head Wo Contrast  06/07/2015  CLINICAL DATA:  Headache and photophobia with nausea EXAM: CT HEAD WITHOUT CONTRAST TECHNIQUE: Contiguous axial images were obtained from the base of the skull through the vertex without intravenous contrast. COMPARISON:  August 25, 2007 FINDINGS: The ventricles are normal in size and configuration. There is no intracranial mass, hemorrhage, extra-axial fluid collection, or midline shift. Gray-white compartments are normal. No acute infarct evident. Bony calvarium appears intact. The mastoid air cells are clear. No intraorbital lesions are identified. IMPRESSION: Study within normal limits. Electronically Signed   By: Lowella Grip III M.D.   On: 06/07/2015 17:45    Microbiology: No results found for this or any previous visit (from the past 240 hour(s)).   Labs: Basic Metabolic Panel:  Recent  Labs Lab 06/05/15 2110 06/07/15 1652 06/08/15 0655  NA 138 138 137  K 4.3 5.0 4.7  CL 94* 95* 94*  CO2 28 28 28   GLUCOSE 107* 64* 61*  BUN 27* 26* 37*  CREATININE 6.89* 8.12* 10.68*  CALCIUM 9.3 9.6 9.6   Liver Function Tests:  Recent Labs Lab 06/05/15 2110  AST 21  ALT 12*  ALKPHOS 76  BILITOT 1.0  PROT 9.2*  ALBUMIN 4.4    Recent Labs Lab 06/05/15 2110  LIPASE 32   No results for input(s): AMMONIA in the last 168 hours. CBC:  Recent Labs Lab 06/05/15 2110 06/07/15 1652 06/08/15 0655  WBC 9.8 7.9 7.3  NEUTROABS  --  4.8  --   HGB 12.8 12.3 11.5*  HCT 38.2 38.7 36.8  MCV 89.3 92.8 93.9  PLT 145* 138* 126*   Cardiac Enzymes: No results for input(s): CKTOTAL, CKMB, CKMBINDEX, TROPONINI in the last 168 hours. BNP: BNP (last  3 results) No results for input(s): BNP in the last 8760 hours.  ProBNP (last 3 results) No results for input(s): PROBNP in the last 8760 hours.  CBG: No results for input(s): GLUCAP in the last 168 hours.     SignedDebbe Odea, MD [facility] 06/08/2015, 11:42 AM

## 2015-06-09 DIAGNOSIS — T8612 Kidney transplant failure: Secondary | ICD-10-CM | POA: Diagnosis not present

## 2015-06-09 DIAGNOSIS — N186 End stage renal disease: Secondary | ICD-10-CM | POA: Diagnosis not present

## 2015-06-09 DIAGNOSIS — D631 Anemia in chronic kidney disease: Secondary | ICD-10-CM | POA: Diagnosis not present

## 2015-06-09 DIAGNOSIS — R519 Headache, unspecified: Secondary | ICD-10-CM | POA: Insufficient documentation

## 2015-06-09 DIAGNOSIS — Z992 Dependence on renal dialysis: Secondary | ICD-10-CM | POA: Diagnosis not present

## 2015-06-09 DIAGNOSIS — Z940 Kidney transplant status: Secondary | ICD-10-CM

## 2015-06-09 DIAGNOSIS — G4489 Other headache syndrome: Secondary | ICD-10-CM

## 2015-06-09 DIAGNOSIS — I160 Hypertensive urgency: Secondary | ICD-10-CM | POA: Diagnosis not present

## 2015-06-09 DIAGNOSIS — R51 Headache: Secondary | ICD-10-CM | POA: Diagnosis not present

## 2015-06-09 LAB — CBC WITH DIFFERENTIAL/PLATELET
Basophils Absolute: 0 10*3/uL (ref 0.0–0.1)
Basophils Relative: 0 %
Eosinophils Absolute: 0.1 10*3/uL (ref 0.0–0.7)
Eosinophils Relative: 1 %
HCT: 37.5 % (ref 36.0–46.0)
Hemoglobin: 12.2 g/dL (ref 12.0–15.0)
Lymphocytes Relative: 31 %
Lymphs Abs: 2.3 K/uL (ref 0.7–4.0)
MCH: 29.8 pg (ref 26.0–34.0)
MCHC: 32.5 g/dL (ref 30.0–36.0)
MCV: 91.7 fL (ref 78.0–100.0)
Monocytes Absolute: 0.7 K/uL (ref 0.1–1.0)
Monocytes Relative: 9 %
Neutro Abs: 4.3 10*3/uL (ref 1.7–7.7)
Neutrophils Relative %: 59 %
Platelets: 162 K/uL (ref 150–400)
RBC: 4.09 MIL/uL (ref 3.87–5.11)
RDW: 21.9 % — ABNORMAL HIGH (ref 11.5–15.5)
WBC: 7.4 10*3/uL (ref 4.0–10.5)

## 2015-06-09 LAB — RENAL FUNCTION PANEL
Albumin: 3.4 g/dL — ABNORMAL LOW (ref 3.5–5.0)
Anion gap: 16 — ABNORMAL HIGH (ref 5–15)
BUN: 48 mg/dL — ABNORMAL HIGH (ref 6–20)
CO2: 26 mmol/L (ref 22–32)
Calcium: 9.7 mg/dL (ref 8.9–10.3)
Chloride: 91 mmol/L — ABNORMAL LOW (ref 101–111)
Creatinine, Ser: 13.37 mg/dL — ABNORMAL HIGH (ref 0.44–1.00)
GFR calc Af Amer: 4 mL/min — ABNORMAL LOW (ref >60)
GFR calc non Af Amer: 3 mL/min — ABNORMAL LOW (ref >60)
Glucose, Bld: 71 mg/dL (ref 65–99)
Phosphorus: 8.7 mg/dL — ABNORMAL HIGH (ref 2.5–4.6)
Potassium: 4.5 mmol/L (ref 3.5–5.1)
Sodium: 133 mmol/L — ABNORMAL LOW (ref 135–145)

## 2015-06-09 MED ORDER — RENA-VITE PO TABS: 1.0000 | ORAL_TABLET | Freq: Every day | ORAL | Status: DC

## 2015-06-09 MED ORDER — ACETAMINOPHEN 325 MG PO TABS: 650.0000 mg | ORAL_TABLET | Freq: Four times a day (QID) | ORAL | Status: DC | PRN

## 2015-06-09 MED ORDER — SODIUM CHLORIDE 0.9 % IV SOLN: 100.0000 mL | INTRAVENOUS | Status: DC | PRN

## 2015-06-09 MED ORDER — HEPARIN SODIUM (PORCINE) 1000 UNIT/ML DIALYSIS: 1000.0000 [IU] | INTRAMUSCULAR | Status: DC | PRN

## 2015-06-09 MED ORDER — PENTAFLUOROPROP-TETRAFLUOROETH EX AERO: 1.0000 "application " | INHALATION_SPRAY | CUTANEOUS | Status: DC | PRN

## 2015-06-09 MED ORDER — LIDOCAINE HCL (PF) 1 % IJ SOLN: 5.0000 mL | INTRAMUSCULAR | Status: DC | PRN

## 2015-06-09 MED ORDER — METOCLOPRAMIDE HCL 5 MG/ML IJ SOLN
10.0000 mg | Freq: Once | INTRAMUSCULAR | Status: AC
  Administered 2015-06-09: 10 mg via INTRAVENOUS
  Filled 2015-06-09: qty 2

## 2015-06-09 MED ORDER — MAGNESIUM SULFATE 2 GM/50ML IV SOLN
2.0000 g | Freq: Once | INTRAVENOUS | Status: AC
  Administered 2015-06-09: 2 g via INTRAVENOUS
  Filled 2015-06-09 (×2): qty 50

## 2015-06-09 MED ORDER — HYDROCODONE-ACETAMINOPHEN 5-325 MG PO TABS
ORAL_TABLET | ORAL | Status: AC
  Filled 2015-06-09: qty 1

## 2015-06-09 MED ORDER — HYDROXYZINE HCL 50 MG/ML IM SOLN
25.0000 mg | Freq: Once | INTRAMUSCULAR | Status: AC
  Administered 2015-06-09: 25 mg via INTRAMUSCULAR
  Filled 2015-06-09: qty 1

## 2015-06-09 MED ORDER — DOXERCALCIFEROL 4 MCG/2ML IV SOLN
INTRAVENOUS | Status: AC
  Filled 2015-06-09: qty 2

## 2015-06-09 MED ORDER — HEPARIN SODIUM (PORCINE) 1000 UNIT/ML DIALYSIS
20.0000 [IU]/kg | INTRAMUSCULAR | Status: DC | PRN
  Administered 2015-06-09: 1100 [IU] via INTRAVENOUS_CENTRAL

## 2015-06-09 MED ORDER — ALTEPLASE 2 MG IJ SOLR
2.0000 mg | Freq: Once | INTRAMUSCULAR | Status: DC | PRN
  Filled 2015-06-09: qty 2

## 2015-06-09 MED ORDER — LIDOCAINE-PRILOCAINE 2.5-2.5 % EX CREA
1.0000 "application " | TOPICAL_CREAM | CUTANEOUS | Status: DC | PRN
  Filled 2015-06-09: qty 5

## 2015-06-09 NOTE — Progress Notes (Signed)
Patient Discharge: Disposition: Patient discharged to home. Education: Reviewed all the medications, prescriptions, follow-up appointment and discharge instructions with the patient, understood and acknowledged. IV:  Discontinued IV before discharge.  Telemetry: Discontinued Tele before discharge, CCMD notified. Transportation: Patient transported out of the unit in w/c with staff and family accompanying. Belongings: Patient took all her belongings with her.

## 2015-06-09 NOTE — Progress Notes (Addendum)
[facility] Progress Note  Assessment/Plan: 1. Hypertensive urgency: Patient had been on labetolol, was stopped due to C/O abdominal pain. However, pt has been seen at [facility] for abdominal pain on 01/12/15. She was treated with solumedrol. Transplantectomy was suggested if symptoms persist. No further visits since. Now on losartan,  labetolol and hydralazine.  Goal ^ today on HD and BP promptly DROPPED into 90 - 100 range  2. ESRD - MWF at [facility].  Tight heparin, plt 126. 3. Anemia - Hgb 12.2 No ESA for now. . (Last dose of ESA in center 06/07/15)- may need to hold at d/c 4. Secondary hyperparathyroidism - Ca 9.6 C Ca 9.28, continue Ca acetate but monitor Ca, continue hectoral for PTH suppression reduce dose to 2 mcg.  P 8.7 5. HTN/volume -current wt 56.7, OP EDW 54.5. Attempt 2.5-3 liters tomorrow.  6. Nutrition - Albumin 4.4. Renal diet, fld restrictions. Add renal vit.  7. Failed Renal Transplant: Continue immunosuppressants.   Myriam Jacobson, PA-C [place] 06/09/2015,9:32 AM    Pt seen, examined, agree w assess/plan as above with additions as indicated. Headaches better and BP under better control.  CT pelvic shows two transplanted kidneys w atrophic calcification and no signs of inflammation (swelling, stranding).  No evidence to support transplant rejection, and also no local pain/ fevers. Regarding BP we tried extra UF today which was unsuccessful d/t cramping so she is not likely vol overloaded.  Doing better, ok for dc today. Have d/w primary MD.  Cont current meds.  Kelly Splinter MD [facility] pager [phone]    cell [phone] 06/09/2015, 10:49 AM      Subjective:   Doesn't get to EDW b/c cramps on HD.  Had been taking clonidne and hydralazine only prior to admission and takes her clonidine pre HD but BP was still high.  Feeling better.  Appetite coming back  Objective Filed Vitals:   06/09/15 0800 06/09/15  0830 06/09/15 0840 06/09/15 0856  BP: 163/96 101/48 106/55 127/63  Pulse: 92 86 72 82  Temp:      TempSrc:      Resp:      Height:      Weight:      SpO2:       Physical Exam General: NAD, thin Heart: RRR Lungs: no rales Abdomen: soft NT Extremities: no edema Dialysis Access:  Left upper AVF  Dialysis Orders: [facility] MonWedFri, 3 hrs 45 min, 180NRe Optiflux, BFR 400, DFR Autoflow 1.5, EDW 54.5 (kg), Dialysate 2.0 K, 2.25 Ca, UFR Profile: None, Sodium Model: None, Access: LUA AV Fistula Heparin: 6900 units per treatment Hectoral: 3 mcg IV q MWF (Last dose 06/07/15) Mircera 75 mcg IV q 2 weeks (last dose 06/07/15)  Additional Objective Labs: Basic Metabolic Panel:  Recent Labs Lab 06/07/15 1652 06/08/15 0655 06/09/15 0739  NA 138 137 133*  K 5.0 4.7 4.5  CL 95* 94* 91*  CO2 28 28 26   GLUCOSE 64* 61* 71  BUN 26* 37* 48*  CREATININE 8.12* 10.68* 13.37*  CALCIUM 9.6 9.6 9.7  PHOS  --   --  8.7*   Liver Function Tests:  Recent Labs Lab 06/05/15 2110 06/09/15 0739  AST 21  --   ALT 12*  --   ALKPHOS 76  --   BILITOT 1.0  --   PROT 9.2*  --   ALBUMIN 4.4 3.4*    Recent Labs Lab 06/05/15 2110  LIPASE 32   CBC:  Recent Labs Lab 06/05/15 2110 06/07/15 1652 06/08/15 0655 06/09/15 0738  WBC 9.8 7.9 7.3 7.4  NEUTROABS  --  4.8  --  4.3  HGB 12.8 12.3 11.5* 12.2  HCT 38.2 38.7 36.8 37.5  MCV 89.3 92.8 93.9 91.7  PLT 145* 138* 126* 162   Studies/Results: Ct Abdomen Pelvis Wo Contrast  06/09/2015  CLINICAL DATA:  29 year old female status post renal transplant with acute right lower quadrant pain. Initial encounter. EXAM: CT ABDOMEN AND PELVIS WITHOUT CONTRAST TECHNIQUE: Multidetector CT imaging of the abdomen and pelvis was performed following the standard protocol without IV contrast. COMPARISON:  Renal ultrasound 08/25/2007. FINDINGS: Mild cardiomegaly. No pericardial effusion. Negative lung bases, no pleural effusion. Visualized osseous  structures appear within normal limits. Small volume pelvic free fluid. Noncontrast uterus and adnexae within normal limits. Negative distal colon. Small volume of gas and fluid in the vagina. Decompressed urinary bladder. Negative sigmoid colon. Decompressed left colon. Negative transverse colon with gas and retained stool. Negative right colon. The cecum and terminal ileum are located in the midline. Decompressed appendix suspected in the midline anterior to the sacrum on series 2, image 61. No regional soft tissue stranding. No dilated small bowel loops. Suspect fluid-filled mostly decompressed small bowel in the right abdomen just below the liver (series 2, image 37).77 fluid in the stomach which otherwise appears unremarkable. Decompressed duodenum. Bilateral native renal atrophy. It appears there are 2 right lower quadrant renal transplant. 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 The more medial/inferior transplant appears more atrophied. The more superior, lateral transplant is larger. Both renal pelves are prominent, but overall no transplant hydronephrosis is suspected. No transplant perinephric stranding or fluid. Noncontrast liver, gallbladder, spleen, pancreas and adrenal glands are within normal limits. No abdominal free fluid or free air. Multiple bilateral pelvic phleboliths. IMPRESSION: 1. Two right lower quadrant transplant kidneys appear to be in place. These would be better evaluated with ultrasound. No definite transplant hydronephrosis or inflammation. Both transplants demonstrated degree of cortical atrophy and moderate dystrophic calcification. 2. No acute or inflammatory process identified in the abdomen. 3. Small volume pelvic free fluid likely is physiologic. 4. Mild cardiomegaly. Electronically Signed   By: Genevie Ann M.D.   On: 06/09/2015 07:23   Ct Head Wo Contrast  06/07/2015  CLINICAL DATA:  Headache and photophobia with nausea EXAM: CT HEAD WITHOUT  CONTRAST TECHNIQUE: Contiguous axial images were obtained from the base of the skull through the vertex without intravenous contrast. COMPARISON:  August 25, 2007 FINDINGS: The ventricles are normal in size and configuration. There is no intracranial mass, hemorrhage, extra-axial fluid collection, or midline shift. Gray-white compartments are normal. No acute infarct evident. Bony calvarium appears intact. The mastoid air cells are clear. No intraorbital lesions are identified. IMPRESSION: Study within normal limits. Electronically Signed   By: Lowella Grip III M.D.   On: 06/07/2015 17:45   Medications:   . amLODipine  10 mg Oral Daily  . calcium acetate  2,001 mg Oral TID WC  . doxercalciferol  2 mcg Intravenous Q M,W,F-HD  . hydrALAZINE  50 mg Oral BID  . labetalol  200 mg Oral BID  . losartan  100 mg Oral Daily  . multivitamin  1 tablet Oral QHS  . predniSONE  5 mg Oral Q breakfast  . sodium chloride  3 mL Intravenous Q12H  . tacrolimus  3 mg Oral BID

## 2015-06-12 DIAGNOSIS — N186 End stage renal disease: Secondary | ICD-10-CM | POA: Diagnosis not present

## 2015-06-12 DIAGNOSIS — N2581 Secondary hyperparathyroidism of renal origin: Secondary | ICD-10-CM | POA: Diagnosis not present

## 2015-06-12 DIAGNOSIS — D631 Anemia in chronic kidney disease: Secondary | ICD-10-CM | POA: Diagnosis not present

## 2015-06-12 DIAGNOSIS — D509 Iron deficiency anemia, unspecified: Secondary | ICD-10-CM | POA: Diagnosis not present

## 2015-06-14 DIAGNOSIS — D509 Iron deficiency anemia, unspecified: Secondary | ICD-10-CM | POA: Diagnosis not present

## 2015-06-14 DIAGNOSIS — N186 End stage renal disease: Secondary | ICD-10-CM | POA: Diagnosis not present

## 2015-06-14 DIAGNOSIS — D631 Anemia in chronic kidney disease: Secondary | ICD-10-CM | POA: Diagnosis not present

## 2015-06-14 DIAGNOSIS — N2581 Secondary hyperparathyroidism of renal origin: Secondary | ICD-10-CM | POA: Diagnosis not present

## 2015-06-16 DIAGNOSIS — N2581 Secondary hyperparathyroidism of renal origin: Secondary | ICD-10-CM | POA: Diagnosis not present

## 2015-06-16 DIAGNOSIS — N186 End stage renal disease: Secondary | ICD-10-CM | POA: Diagnosis not present

## 2015-06-16 DIAGNOSIS — D509 Iron deficiency anemia, unspecified: Secondary | ICD-10-CM | POA: Diagnosis not present

## 2015-06-16 DIAGNOSIS — D631 Anemia in chronic kidney disease: Secondary | ICD-10-CM | POA: Diagnosis not present

## 2015-06-18 ENCOUNTER — Emergency Department (HOSPITAL_COMMUNITY): Payer: Medicare Other

## 2015-06-18 ENCOUNTER — Encounter (HOSPITAL_COMMUNITY): Payer: Self-pay | Admitting: Emergency Medicine

## 2015-06-18 ENCOUNTER — Emergency Department (HOSPITAL_COMMUNITY)
Admission: EM | Admit: 2015-06-18 | Discharge: 2015-06-18 | Disposition: A | Discharge status: Home / Self Care | Payer: Medicare Other | Attending: Physician Assistant | Admitting: Physician Assistant

## 2015-06-18 DIAGNOSIS — Z79899 Other long term (current) drug therapy: Secondary | ICD-10-CM | POA: Diagnosis not present

## 2015-06-18 DIAGNOSIS — I120 Hypertensive chronic kidney disease with stage 5 chronic kidney disease or end stage renal disease: Secondary | ICD-10-CM | POA: Insufficient documentation

## 2015-06-18 DIAGNOSIS — Z7952 Long term (current) use of systemic steroids: Secondary | ICD-10-CM | POA: Insufficient documentation

## 2015-06-18 DIAGNOSIS — K5900 Constipation, unspecified: Secondary | ICD-10-CM | POA: Diagnosis not present

## 2015-06-18 DIAGNOSIS — Z940 Kidney transplant status: Secondary | ICD-10-CM | POA: Insufficient documentation

## 2015-06-18 DIAGNOSIS — Z992 Dependence on renal dialysis: Secondary | ICD-10-CM | POA: Insufficient documentation

## 2015-06-18 DIAGNOSIS — N186 End stage renal disease: Secondary | ICD-10-CM | POA: Insufficient documentation

## 2015-06-18 MED ORDER — HYDRALAZINE HCL 50 MG PO TABS
50.0000 mg | ORAL_TABLET | Freq: Once | ORAL | Status: AC
  Administered 2015-06-18: 50 mg via ORAL
  Filled 2015-06-18: qty 1

## 2015-06-18 MED ORDER — POLYETHYLENE GLYCOL 3350 17 G PO PACK: 17.0000 g | PACK | Freq: Every day | ORAL | Status: AC

## 2015-06-18 MED ORDER — AMLODIPINE BESYLATE 10 MG PO TABS
10.0000 mg | ORAL_TABLET | Freq: Once | ORAL | Status: AC
  Administered 2015-06-18: 10 mg via ORAL
  Filled 2015-06-18: qty 1

## 2015-06-18 MED ORDER — LOSARTAN POTASSIUM 50 MG PO TABS
100.0000 mg | ORAL_TABLET | Freq: Once | ORAL | Status: AC
Start: 2015-06-18 — End: 2015-06-18
  Administered 2015-06-18: 100 mg via ORAL
  Filled 2015-06-18: qty 2

## 2015-06-18 MED ORDER — FLEET ENEMA 7-19 GM/118ML RE ENEM
1.0000 | ENEMA | Freq: Once | RECTAL | Status: AC
  Administered 2015-06-18: 1 via RECTAL
  Filled 2015-06-18: qty 1

## 2015-06-18 NOTE — ED Notes (Signed)
She feels much better and has passed a large quantity of stool.  Jarrett Soho, our P.A. Has just seen her.  I have just given her b/p meds--will check b/p in ~ 20 min. And d/c if appropriate at that time.

## 2015-06-18 NOTE — ED Notes (Signed)
The enema produced little effect.  Per pt. Request our C.N., Manuela Schwartz is currently attempting a 2nd manual dis-impaction.

## 2015-06-18 NOTE — Discharge Instructions (Signed)
Constipation, Adult Constipation is when a person has fewer than three bowel movements a week, has difficulty having a bowel movement, or has stools that are dry, hard, or larger than normal. As people grow older, constipation is more common. A low-fiber diet, not taking in enough fluids, and taking certain medicines may make constipation worse.  CAUSES   Certain medicines, such as antidepressants, pain medicine, iron supplements, antacids, and water pills.   Certain diseases, such as diabetes, irritable bowel syndrome (IBS), thyroid disease, or depression.   Not drinking enough water.   Not eating enough fiber-rich foods.   Stress or travel.   Lack of physical activity or exercise.   Ignoring the urge to have a bowel movement.   Using laxatives too much.  SIGNS AND SYMPTOMS   Having fewer than three bowel movements a week.   Straining to have a bowel movement.   Having stools that are hard, dry, or larger than normal.   Feeling full or bloated.   Pain in the lower abdomen.   Not feeling relief after having a bowel movement.  DIAGNOSIS  Your health care provider will take a medical history and perform a physical exam. Further testing may be done for severe constipation. Some tests may include:  A barium enema X-ray to examine your rectum, colon, and, sometimes, your small intestine.   A sigmoidoscopy to examine your lower colon.   A colonoscopy to examine your entire colon. TREATMENT  Treatment will depend on the severity of your constipation and what is causing it. Some dietary treatments include drinking more fluids and eating more fiber-rich foods. Lifestyle treatments may include regular exercise. If these diet and lifestyle recommendations do not help, your health care provider may recommend taking over-the-counter laxative medicines to help you have bowel movements. Prescription medicines may be prescribed if over-the-counter medicines do not work.    HOME CARE INSTRUCTIONS   Eat foods that have a lot of fiber, such as fruits, vegetables, whole grains, and beans.  Limit foods high in fat and processed sugars, such as french fries, hamburgers, cookies, candies, and soda.   A fiber supplement may be added to your diet if you cannot get enough fiber from foods.   Drink enough fluids to keep your urine clear or pale yellow.   Exercise regularly or as directed by your health care provider.   Go to the restroom when you have the urge to go. Do not hold it.   Only take over-the-counter or prescription medicines as directed by your health care provider. Do not take other medicines for constipation without talking to your health care provider first.  [place] IF:   You have bright red blood in your stool.   Your constipation lasts for more than 4 days or gets worse.   You have abdominal or rectal pain.   You have thin, pencil-like stools.   You have unexplained weight loss. MAKE SURE YOU:   Understand these instructions.  Will watch your condition.  Will get help right away if you are not doing well or get worse.   This information is not intended to replace advice given to you by your health care provider. Make sure you discuss any questions you have with your health care provider.   Document Released: 03/08/2004 Document Revised: 07/01/2014 Document Reviewed: 03/22/2013 Elsevier Interactive Patient Education 2016 [employer].  Emergency Department Resource Guide 1) Find a Doctor and Pay Out of Pocket Although you won't have to find  out who is covered by your insurance plan, it is a good idea to ask around and get recommendations. You will then need to call the office and see if the doctor you have chosen will accept you as a new patient and what types of options they offer for patients who are self-pay. Some doctors offer discounts or will set up payment plans for their patients who do not have  insurance, but you will need to ask so you aren't surprised when you get to your appointment.  2) Contact Your Local Health Department Not all health departments have doctors that can see patients for sick visits, but many do, so it is worth a call to see if yours does. If you don't know where your local health department is, you can check in your phone book. The CDC also has a tool to help you locate your state's health department, and many state websites also have listings of all of their local health departments.  3) Find a [facility] If your illness is not likely to be very severe or complicated, you may want to try a walk in clinic. These are popping up all over the country in pharmacies, drugstores, and shopping centers. They're usually staffed by nurse practitioners or physician assistants that have been trained to treat common illnesses and complaints. They're usually fairly quick and inexpensive. However, if you have serious medical issues or chronic medical problems, these are probably not your best option.  No Primary Care Doctor: - Call Health Connect at  [phone] - they can help you locate a primary care doctor that  accepts your insurance, provides certain services, etc. - Physician Referral Service- [phone]  Chronic Pain Problems: Organization         Address  Phone   Notes  [facility]  [phone] Patients need to be referred by their primary care doctor.   Medication Assistance: Organization         Address  Phone   Notes  [facility] Medication [facility] [address] [place], [place] [address] [phone] --Must be a resident of [facility] -- Must have NO insurance coverage whatsoever (no Medicaid/ Medicare, etc.) -- The pt. MUST have a primary care doctor that directs their care regularly and follows them in the community   MedAssist  [phone]   [employer]  [phone]    Agencies that provide  inexpensive medical care: Organization         Address  Phone   Notes  [place]  [phone]   [facility]    [phone]   [facility] [place],  [address] [phone]   [place] [address] [place] [phone]   Planned Parenthood    [phone]   [facility]    [phone]   [place] and [place], [place] Phone:  [phone], Fax:  [phone] Hours of Operation:  9 am - 6 pm, M-F.  Also accepts Medicaid/Medicare and self-pay.  [facility] for [place] [address], [address], [place] Phone: [phone], Fax: [phone]. Hours of Operation:  8:30 am - 5:30 pm, M-F.  Also accepts Medicaid and self-pay.  [facility] [address] [employer] Phone: [phone]   [place], [place],  [place] [phone], Ext. [phone] Mondays & Thursdays: 7-9 AM.  First 15 patients are seen on a first come, first serve basis.    [place] Providers:  Organization         Address  Phone   Notes  [facility] [address], [place] [phone] Also accepts self-pay patients.  [facility] [id number] [place], [place]  [phone]   [place], [address], [place] [phone]   [facility] [address], [place] [phone]   Lucianne Lei [address], [place]   [phone] Only accepts [place] Access [place] patients after they have their name applied to their card.   Self-Pay (no insurance) in [facility]:  Organization         Address  Phone   Notes  Sickle Cell Patients, [facility] [place] [phone]   [facility] Urgent  Care [place] [phone]   [facility] [place]  [place], [place], [place] [phone]   [facility]/Dr. Osei-Bonsu  [address] [place] or [address], [place] [phone] Phone number for both [place] and [place] locations is the same.  Urgent Medical and [facility] [address] [place] [phone]   [facility] [address] [place] or [address] [phone]   [facility] [address], [place] [phone], phone; [phone], fax Sees patients 1st and 3rd Saturday of every month.  Must not qualify for public or private insurance (i.e. Medicaid, Medicare, [place] Health Choice, Veterans' Benefits)  Household income should be no more than 200% of the poverty level The clinic cannot treat you if you are pregnant or think you are pregnant  Sexually transmitted diseases are not treated at the clinic.    Dental Care: Organization         Address  Phone  Notes  [facility] Department of [facility] [place] [phone] Accepts children up to age 36 who are enrolled in [place] or [place]; pregnant women with a Medicaid card; and children who have applied for Medicaid or [place] Health Choice, but were declined, whose parents can pay a reduced fee at time of service.  [facility] Department of [facility]  [address], [place] [phone] Accepts children up to age 77 who are enrolled in [place] or [place]; pregnant women with a Medicaid card; and children who have applied for Medicaid or [place] Health Choice, but were declined, whose parents can pay a reduced fee at time of service.  [place] Adult Dental Access PROGRAM  [employer] [phone] Patients are seen by appointment only. Walk-ins are  not accepted. [place] will see patients 64 years of age and older. Monday - Tuesday (8am-5pm) Most Wednesdays (8:30-5pm) $30 per visit, cash only  [facility] Adult Dental Access PROGRAM  [address], [facility] [phone] Patients are seen by appointment only. Walk-ins are not accepted. [place] will see patients 62 years of age and older. One Wednesday Evening (Monthly: Volunteer Based).  $30 per visit, cash only  [employer]  (  [phone] for adults; Children under age 81, call Graduate Pediatric Dentistry at [phone]. Children aged 73-14, please call [phone] to request a pediatric application.  Dental services are provided in all areas of dental care including fillings, crowns and bridges, complete and partial dentures, implants, gum treatment, root canals, and extractions. Preventive care is also provided. Treatment is provided to both adults and children. Patients are selected via a lottery and there is often a waiting list.   [facility] [address] [place]  [phone] [email or web address]   [facility] [address] [place], [place] [phone], Ext. [phone] Second and Fourth Thursday of each month, opens at 6:30 AM; Clinic ends at 9 AM.  Patients are seen on a first-come first-served basis, and a limited number are seen during each clinic.   [facility]  [address] Hillard Danker [place], [place] [phone]   Eligibility Requirements You must have lived in [place], [place], or [place] counties for at least the last three months.   You cannot be eligible for state or federal sponsored [employer], including [employer], [place], or [employer].   You generally cannot be eligible for healthcare insurance through your employer.    How to apply: Eligibility screenings are held every Tuesday and Wednesday afternoon from 1:00 pm until 4:00 pm. You do not need an appointment for  the interview!  [facility] [address], [place], [place]   [place]  [place] Department  [place]  [phone]    Behavioral Health Resources in the Community: Intensive Outpatient Programs Organization         Address  Phone  Notes  [facility] [place]. [address], [place], [place] [phone]   [facility] Outpatient [address], [place], [place]   ADS: Alcohol & Drug Svcs [address], [place], [place]   [place] [address]  [place], [place] or [phone]   Substance Abuse Resources Organization         Address  Phone  Notes  Alcohol and Drug Services  [phone]   [place]  [phone]   [facility]   Chinita Pester  [phone]   Residential & Outpatient Substance Abuse Program  [phone]   Psychological Services Organization         Address  Phone  Notes  [facility]  [place]  [phone]   [place] [address] [place] or [phone]    Mobile Crisis Teams Organization         Address  Phone  Notes  [facility], Mobile Crisis Care Unit  [phone]   [facility]  [address] [place], [place]   [place] Levels [address] [place] [phone]    Self-Help/Support Groups Organization         Address  Phone             Notes  [place]. of [place] - variety of support groups  [place] Call for more information  Narcotics Anonymous (NA), [facility] [address], [employer] [place]  2 meetings at this location   Brewing technologist  Notes  [facility]idential [facility]  [address]    [place]  [phone]   [facility]  [address] [place] [id number], [place], [place]   [place], [place] [phone] Admissions: 8am-3pm M-F  Incentives Substance [place] [address],    [place], [place] [phone]   [facility] [address] [place], [place], [place]   [facility] [address],  [place], [place]   [facility] Programs - Intensive Outpatient [address] Kristeen [address], [place], [place]   [facility] ([place] [address] [place]  [place], [place] [phone] or [phone]   [facility] Treatment Services (RTS) [address] [place], [place] Accepts Medicaid  [facility] [place] [address],  [place] [phone] Substance Abuse/Addiction Treatment   [facility] Organization         Address  Phone  Notes  [facility]  [phone]   Domenic Schwab, PhD [address] [place], [place]   [phone] or [phone]   [place], [place] [phone]   [facility] [address], [place], [place] [phone] Insurance/Medicaid/sponsorship through [facility] and Families [address], [address] [place]                                    [place], [place] [phone] [place] [address], [place] [phone]    Dr. Adele Schilder  [phone]   [facility] Dept. 1) [address] [place] 2) [place] 3)  [address], [place] [phone]  [phone]   [place] [phone] or [phone] (After Hours)

## 2015-06-18 NOTE — ED Notes (Signed)
Pt wants to wait on lab draw states she is in pain.

## 2015-06-18 NOTE — ED Notes (Signed)
Dialysis pt. Unable to give urine sample

## 2015-06-18 NOTE — ED Provider Notes (Signed)
CSN: [id number]     Arrival date & time 06/18/15  1138 History   First MD Initiated Contact with Patient 06/18/15 1144     Chief Complaint  Patient presents with  . Constipation   (Consider location/radiation/quality/duration/timing/severity/associated sxs/prior Treatment) The history is provided by the patient and a friend. No language interpreter was used.   Kerry Cook is a 29 year old female with a history of hypertension, kidney transplant, and ESRD on dialysis who presents with constipation 5 days. She states that she is leaking but feels that something is stuck in her rectum. She states it is hard and she can smell it causes an stuck there for some time. Her last dialysis was done 2 days ago. She normally has it done on Monday, Wednesday, and Friday. She has taken Ex-Lax, stool softeners, and drink hot chocolate with no relief.  She denies any fever, chills, shortness of breath, chest pain, vomiting. Past Medical History  Diagnosis Date  . Hypertension   . History of kidney transplant 2012    kidney failure due to hypertension  . Chronic kidney disease     previous hx dialysis  . Dialysis patient [name])    Past Surgical History  Procedure Laterality Date  . Kidney transplant Bilateral 2012   Family History  Problem Relation Age of Onset  . Family history unknown: Yes   Social History  Substance Use Topics  . Smoking status: Never Smoker   . Smokeless tobacco: Never Used  . Alcohol Use: No   OB History    Gravida Para Term Preterm AB TAB SAB Ectopic Multiple Living   1    1  1    0     Review of Systems  Constitutional: Negative for fever.  Respiratory: Negative for shortness of breath.   Cardiovascular: Negative for chest pain.  Gastrointestinal: Positive for abdominal pain and constipation. Negative for vomiting.  All other systems reviewed and are negative.     Allergies  Review of patient's allergies indicates no known allergies.  Home Medications    Prior to Admission medications   Medication Sig Start Date End Date Taking? Authorizing Provider  amLODipine (NORVASC) 10 MG tablet Take 10 mg by mouth daily.   Yes Historical Provider, MD  calcium acetate (PHOSLO) 667 MG capsule Take 2,001 mg by mouth 3 (three) times daily with meals. 05/29/15  Yes Historical Provider, MD  hydrALAZINE (APRESOLINE) 50 MG tablet Take 50 mg by mouth 2 (two) times daily. 06/01/15  Yes Historical Provider, MD  labetalol (NORMODYNE) 200 MG tablet Take 1 tablet (200 mg total) by mouth 2 (two) times daily. 06/08/15  Yes Debbe Odea, MD  losartan (COZAAR) 100 MG tablet Take 1 tablet (100 mg total) by mouth daily. 02/18/15  Yes [name], MD  multivitamin (RENA-VIT) TABS tablet Take 1 tablet by mouth at bedtime. 06/09/15  Yes Debbe Odea, MD  predniSONE (DELTASONE) 5 MG tablet Take 5 mg by mouth daily with breakfast.   Yes Historical Provider, MD  tacrolimus (PROGRAF) 1 MG capsule Take 3 mg by mouth 2 (two) times daily.    Yes Historical Provider, MD  acetaminophen (TYLENOL) 325 MG tablet Take 2 tablets (650 mg total) by mouth every 6 (six) hours as needed for mild pain or headache (or Fever >/= 101). Patient not taking: Reported on 06/18/2015 06/09/15   Debbe Odea, MD  HYDROcodone-acetaminophen (NORCO) 5-325 MG tablet Take 1 tablet by mouth every 6 (six) hours as needed (for pain). Patient not taking: Reported on  06/18/2015 06/06/15   John Molpus, MD  polyethylene glycol (MIRALAX / GLYCOLAX) packet Take 17 g by mouth daily. 06/18/15 06/24/15  Marajade Lei Patel-Mills, PA-C   BP 201/135 mmHg  Pulse 95  Temp(Src) 98.2 F (36.8 C) (Oral)  Resp 17  SpO2 100%  LMP 06/07/2015 Physical Exam  Constitutional: She appears well-developed and well-nourished. No distress.  HENT:  Head: Normocephalic and atraumatic.  Eyes: Conjunctivae are normal.  Neck: Normal range of motion. Neck supple.  Cardiovascular: Normal rate.   Pulmonary/Chest: Effort normal.  Lungs clear to  auscultation bilaterally. No wheezing.  Abdominal: Soft. She exhibits no distension. There is tenderness. There is no rebound and no guarding.  Lower abdominal tenderness to palpation. No distention. No guarding or rebound.  Genitourinary:  Rectal exam, chaperone present: Fecal impaction. Small amount of hard brown stool was removed. No hemorrhoids or anal fissures.  Nursing note and vitals reviewed.   ED Course  Procedures (including critical care time) Labs Review Labs Reviewed - No data to display  Imaging Review No results found.    EKG Interpretation None      MDM   Final diagnoses:  Constipation, unspecified constipation type   Patient presents for constipation 5 days. No relief with Ex-Lax, stool softener, and hot chocolate. Patient was disimpacted. She was also given an enema and had a very large stool. She states she felt better immediately. Patient has remained hypertensive but states she has not taken her 3 hypertension medications this morning. Medications  sodium phosphate (FLEET) 7-19 GM/118ML enema 1 enema (1 enema Rectal Given 06/18/15 1346)  amLODipine (NORVASC) tablet 10 mg (10 mg Oral Given 06/18/15 1456)  hydrALAZINE (APRESOLINE) tablet 50 mg (50 mg Oral Given 06/18/15 1456)  losartan (COZAAR) tablet 100 mg (100 mg Oral Given 06/18/15 1456)  I do not believe the patient needs further workup. She is scheduled to be dialyzed on Monday. She reports no shortness of breath, chest pain, abdominal pain, vomiting. I discussed return precautions with the patient. She was prescribed MiraLAX. Follow-up was also discussed and patient agrees with plan.     Ottie Glazier, PA-C 06/18/15 1509  Courteney Julio Alm, MD 06/18/15 1510

## 2015-06-18 NOTE — ED Notes (Signed)
Nurse aware of pt high bp

## 2015-06-18 NOTE — ED Notes (Signed)
Per pt states she has not had a bm since Monday-tried stool softeners etc yesterday with no relief-last dialysis was Friday

## 2015-06-19 DIAGNOSIS — N186 End stage renal disease: Secondary | ICD-10-CM | POA: Diagnosis not present

## 2015-06-19 DIAGNOSIS — D631 Anemia in chronic kidney disease: Secondary | ICD-10-CM | POA: Diagnosis not present

## 2015-06-19 DIAGNOSIS — N2581 Secondary hyperparathyroidism of renal origin: Secondary | ICD-10-CM | POA: Diagnosis not present

## 2015-06-19 DIAGNOSIS — D509 Iron deficiency anemia, unspecified: Secondary | ICD-10-CM | POA: Diagnosis not present

## 2015-06-21 DIAGNOSIS — N186 End stage renal disease: Secondary | ICD-10-CM | POA: Diagnosis not present

## 2015-06-21 DIAGNOSIS — D509 Iron deficiency anemia, unspecified: Secondary | ICD-10-CM | POA: Diagnosis not present

## 2015-06-21 DIAGNOSIS — D631 Anemia in chronic kidney disease: Secondary | ICD-10-CM | POA: Diagnosis not present

## 2015-06-21 DIAGNOSIS — N2581 Secondary hyperparathyroidism of renal origin: Secondary | ICD-10-CM | POA: Diagnosis not present

## 2015-06-23 DIAGNOSIS — N2581 Secondary hyperparathyroidism of renal origin: Secondary | ICD-10-CM | POA: Diagnosis not present

## 2015-06-23 DIAGNOSIS — D509 Iron deficiency anemia, unspecified: Secondary | ICD-10-CM | POA: Diagnosis not present

## 2015-06-23 DIAGNOSIS — N186 End stage renal disease: Secondary | ICD-10-CM | POA: Diagnosis not present

## 2015-06-23 DIAGNOSIS — D631 Anemia in chronic kidney disease: Secondary | ICD-10-CM | POA: Diagnosis not present

## 2015-06-24 DIAGNOSIS — N186 End stage renal disease: Secondary | ICD-10-CM | POA: Diagnosis not present

## 2015-06-24 DIAGNOSIS — T8610 Unspecified complication of kidney transplant: Secondary | ICD-10-CM | POA: Diagnosis not present

## 2015-06-24 DIAGNOSIS — Z992 Dependence on renal dialysis: Secondary | ICD-10-CM | POA: Diagnosis not present

## 2015-06-26 DIAGNOSIS — D631 Anemia in chronic kidney disease: Secondary | ICD-10-CM | POA: Diagnosis not present

## 2015-06-26 DIAGNOSIS — N186 End stage renal disease: Secondary | ICD-10-CM | POA: Diagnosis not present

## 2015-06-26 DIAGNOSIS — N2581 Secondary hyperparathyroidism of renal origin: Secondary | ICD-10-CM | POA: Diagnosis not present

## 2015-06-26 DIAGNOSIS — D509 Iron deficiency anemia, unspecified: Secondary | ICD-10-CM | POA: Diagnosis not present

## 2015-06-26 DIAGNOSIS — E8770 Fluid overload, unspecified: Secondary | ICD-10-CM | POA: Diagnosis not present

## 2015-06-28 DIAGNOSIS — D631 Anemia in chronic kidney disease: Secondary | ICD-10-CM | POA: Diagnosis not present

## 2015-06-28 DIAGNOSIS — N2581 Secondary hyperparathyroidism of renal origin: Secondary | ICD-10-CM | POA: Diagnosis not present

## 2015-06-28 DIAGNOSIS — E8770 Fluid overload, unspecified: Secondary | ICD-10-CM | POA: Diagnosis not present

## 2015-06-28 DIAGNOSIS — N186 End stage renal disease: Secondary | ICD-10-CM | POA: Diagnosis not present

## 2015-06-28 DIAGNOSIS — D509 Iron deficiency anemia, unspecified: Secondary | ICD-10-CM | POA: Diagnosis not present

## 2015-06-30 ENCOUNTER — Ambulatory Visit (INDEPENDENT_AMBULATORY_CARE_PROVIDER_SITE_OTHER): Payer: Medicare Other | Admitting: Family Medicine

## 2015-06-30 VITALS — BP 144/93 | HR 105 | Temp 98.3°F | Resp 16 | Ht 60.0 in | Wt 124.0 lb

## 2015-06-30 DIAGNOSIS — G44209 Tension-type headache, unspecified, not intractable: Secondary | ICD-10-CM

## 2015-06-30 DIAGNOSIS — N186 End stage renal disease: Secondary | ICD-10-CM

## 2015-06-30 DIAGNOSIS — Z940 Kidney transplant status: Secondary | ICD-10-CM | POA: Diagnosis not present

## 2015-06-30 DIAGNOSIS — Z992 Dependence on renal dialysis: Secondary | ICD-10-CM

## 2015-06-30 DIAGNOSIS — N2581 Secondary hyperparathyroidism of renal origin: Secondary | ICD-10-CM | POA: Diagnosis not present

## 2015-06-30 DIAGNOSIS — E8770 Fluid overload, unspecified: Secondary | ICD-10-CM | POA: Diagnosis not present

## 2015-06-30 DIAGNOSIS — D631 Anemia in chronic kidney disease: Secondary | ICD-10-CM | POA: Diagnosis not present

## 2015-06-30 DIAGNOSIS — I10 Essential (primary) hypertension: Secondary | ICD-10-CM | POA: Diagnosis not present

## 2015-06-30 DIAGNOSIS — D509 Iron deficiency anemia, unspecified: Secondary | ICD-10-CM | POA: Diagnosis not present

## 2015-06-30 MED ORDER — ACETAMINOPHEN 500 MG PO TABS: 500.0000 mg | ORAL_TABLET | Freq: Four times a day (QID) | ORAL | Status: DC | PRN

## 2015-06-30 NOTE — Progress Notes (Signed)
Subjective:    Patient ID: Kerry Cook, female    DOB: [birth year], 30 y.o.   MRN: [id number]  HPI  Ms. Adree Mccaleb, a 30 year old patient that presents to establish care. She maintains that she had a kidney transplant in 2012 at [facility] She maintains that she is currently under the care of nephrology for weekly diaysis. She receives dialysis on Monday, Wednesday, and Friday at [facility]. She maintains that her nephrologist is Dr. Louis Matte, [facility]. Patient also has a history of accelerated hypertension. She was recently hospitalized for hypertension on 06/07/2015. She maintains that she has been taking medications consistently. She does not check blood pressures at home and maintains that blood pressure is typically measured at dialysis center. She also does not exercise but follows a balanced diet. Patient denies chest pressure/discomfort, dyspnea, fatigue, palpitations, syncope and tachypnea.  Past Medical History  Diagnosis Date  . Hypertension   . History of kidney transplant 2012    kidney failure due to hypertension  . Chronic kidney disease     previous hx dialysis  . Dialysis patient [facility])    Immunization History  Administered Date(s) Administered  . Influenza,inj,Quad PF,36+ Mos 06/02/2014, 02/23/2015   Social History   Social History  . Marital Status: Single    Spouse Name: N/A  . Number of Children: N/A  . Years of Education: N/A   Occupational History  . Not on file.   Social History Main Topics  . Smoking status: Never Smoker   . Smokeless tobacco: Never Used  . Alcohol Use: No  . Drug Use: No  . Sexual Activity:    Partners: Male    Birth Control/ Protection: Condom, None   Other Topics Concern  . Not on file   Social History Narrative   Review of Systems  Constitutional: Negative.  Negative for fever, fatigue and unexpected weight change.  HENT: Negative.   Eyes: Negative.     Respiratory: Negative.   Cardiovascular: Negative for chest pain, palpitations and leg swelling.  Gastrointestinal: Negative.   Endocrine: Negative for polydipsia, polyphagia and polyuria.  Genitourinary: Negative.   Musculoskeletal: Negative.  Negative for back pain.  Skin: Negative.   Allergic/Immunologic: Negative.   Neurological: Negative.   Hematological: Negative.   Psychiatric/Behavioral: Negative.        Objective:   Physical Exam  Constitutional: She is oriented to person, place, and time. She appears well-developed and well-nourished.  HENT:  Head: Normocephalic and atraumatic.  Right Ear: External ear normal.  Left Ear: External ear normal.  Nose: Nose normal.  Mouth/Throat: Oropharynx is clear and moist.  Eyes: Conjunctivae are normal. Pupils are equal, round, and reactive to light.  Neck: Normal range of motion. Neck supple.  Cardiovascular: Normal rate, regular rhythm, normal heart sounds and intact distal pulses.   Pulmonary/Chest: Effort normal and breath sounds normal.  Abdominal: Soft. Bowel sounds are normal.  Musculoskeletal: Normal range of motion.  Neurological: She is alert and oriented to person, place, and time. She has normal reflexes.  Skin: Skin is warm and dry.     Psychiatric: She has a normal mood and affect. Her behavior is normal. Judgment and thought content normal.     BP 144/93 mmHg  Pulse 105  Temp(Src) 98.3 F (36.8 C) (Oral)  Resp 16  Ht 5' (1.524 m)  Wt 124 lb (56.246 kg)  BMI 24.22 kg/m2  LMP 06/07/2015 Assessment & Plan:  1.  Tension-type headache, not intractable, unspecified chronicity pattern Will maintain a headache diary if headaches occur persistently. Will continue Tylenol 500 mg every 6 hours as needed.  - acetaminophen (TYLENOL) 500 MG tablet; Take 1 tablet (500 mg total) by mouth every 6 (six) hours as needed.  Dispense: 30 tablet; Refill: 0  2. End stage renal disease on dialysis [facility]) Patient to continue dialysis  at [facility] on M, W, F, as previously scheduled.   3. HYPERTENSION, BENIGN SYSTEMIC Blood pressure is at goal on current medication regimen. Will request laboratory results   4. History of renal transplant Follow up with Dr. Mercy Moore as scheduled. Continue Prograf 10 mg as previously prescribed.   Routine Health Maintenance:  Recommend that patient returns for Medicare Wellness Visit around April. Given information on HPOA, will discuss further during appointment.  Recommend routine eye examination.  Pap smear scheduled with Dr. Jodi Mourning, gynecologist. Up to date on vaccinations She maintains that she has labs drawn every Wednesday, will request labs from [facility].     RTC: 3 months for medicare wellness exam [name], FNP

## 2015-06-30 NOTE — Patient Instructions (Signed)
General Headache Without Cause A headache is pain or discomfort felt around the head or neck area. The specific cause of a headache may not be found. There are many causes and types of headaches. A few common ones are:  Tension headaches.  Migraine headaches.  Cluster headaches.  Chronic daily headaches. HOME CARE INSTRUCTIONS  Watch your condition for any changes. Take these steps to help with your condition: Managing Pain  Take over-the-counter and prescription medicines only as told by your health care provider.  Lie down in a dark, quiet room when you have a headache.  If directed, apply ice to the head and neck area:  Put ice in a plastic bag.  Place a towel between your skin and the bag.  Leave the ice on for 20 minutes, 2-3 times per day.  Use a heating pad or hot shower to apply heat to the head and neck area as told by your health care provider.  Keep lights dim if bright lights bother you or make your headaches worse. Eating and Drinking  Eat meals on a regular schedule.  Limit alcohol use.  Decrease the amount of caffeine you drink, or stop drinking caffeine. General Instructions  Keep all follow-up visits as told by your health care provider. This is important.  Keep a headache journal to help find out what may trigger your headaches. For example, write down:  What you eat and drink.  How much sleep you get.  Any change to your diet or medicines.  Try massage or other relaxation techniques.  Limit stress.  Sit up straight, and do not tense your muscles.  Do not use tobacco products, including cigarettes, chewing tobacco, or e-cigarettes. If you need help quitting, ask your health care provider.  Exercise regularly as told by your health care provider.  Sleep on a regular schedule. Get 7-9 hours of sleep, or the amount recommended by your health care provider. SEEK MEDICAL CARE IF:   Your symptoms are not helped by medicine.  You have a  headache that is different from the usual headache.  You have nausea or you vomit.  You have a fever. SEEK IMMEDIATE MEDICAL CARE IF:   Your headache becomes severe.  You have repeated vomiting.  You have a stiff neck.  You have a loss of vision.  You have problems with speech.  You have pain in the eye or ear.  You have muscular weakness or loss of muscle control.  You lose your balance or have trouble walking.  You feel faint or pass out.  You have confusion.   This information is not intended to replace advice given to you by your health care provider. Make sure you discuss any questions you have with your health care provider.   Document Released: 06/10/2005 Document Revised: 03/01/2015 Document Reviewed: 10/03/2014 Elsevier Interactive Patient Education 2016 [employer]. Hypertension Hypertension, commonly called high blood pressure, is when the force of blood pumping through your arteries is too strong. Your arteries are the blood vessels that carry blood from your heart throughout your body. A blood pressure reading consists of a higher number over a lower number, such as 110/72. The higher number (systolic) is the pressure inside your arteries when your heart pumps. The lower number (diastolic) is the pressure inside your arteries when your heart relaxes. Ideally you want your blood pressure below 120/80. Hypertension forces your heart to work harder to pump blood. Your arteries may become narrow or stiff. Having untreated or uncontrolled  hypertension can cause heart attack, stroke, kidney disease, and other problems. RISK FACTORS Some risk factors for high blood pressure are controllable. Others are not.  Risk factors you cannot control include:   Race. You may be at higher risk if you are African American.  Age. Risk increases with age.  Gender. Men are at higher risk than women before age 28 years. After age 51, women are at higher risk than men. Risk factors  you can control include:  Not getting enough exercise or physical activity.  Being overweight.  Getting too much fat, sugar, calories, or salt in your diet.  Drinking too much alcohol. SIGNS AND SYMPTOMS Hypertension does not usually cause signs or symptoms. Extremely high blood pressure (hypertensive crisis) may cause headache, anxiety, shortness of breath, and nosebleed. DIAGNOSIS To check if you have hypertension, your health care provider will measure your blood pressure while you are seated, with your arm held at the level of your heart. It should be measured at least twice using the same arm. Certain conditions can cause a difference in blood pressure between your right and left arms. A blood pressure reading that is higher than normal on one occasion does not mean that you need treatment. If it is not clear whether you have high blood pressure, you may be asked to return on a different day to have your blood pressure checked again. Or, you may be asked to monitor your blood pressure at home for 1 or more weeks. TREATMENT Treating high blood pressure includes making lifestyle changes and possibly taking medicine. Living a healthy lifestyle can help lower high blood pressure. You may need to change some of your habits. Lifestyle changes may include:  Following the DASH diet. This diet is high in fruits, vegetables, and whole grains. It is low in salt, red meat, and added sugars.  Keep your sodium intake below 2,300 mg per day.  Getting at least 30-45 minutes of aerobic exercise at least 4 times per week.  Losing weight if necessary.  Not smoking.  Limiting alcoholic beverages.  Learning ways to reduce stress. Your health care provider may prescribe medicine if lifestyle changes are not enough to get your blood pressure under control, and if one of the following is true:  You are 57-48 years of age and your systolic blood pressure is above 140.  You are 42 years of age or older,  and your systolic blood pressure is above 150.  Your diastolic blood pressure is above 90.  You have diabetes, and your systolic blood pressure is over [id number] or your diastolic blood pressure is over 90.  You have kidney disease and your blood pressure is above 140/90.  You have heart disease and your blood pressure is above 140/90. Your personal target blood pressure may vary depending on your medical conditions, your age, and other factors. HOME CARE INSTRUCTIONS  Have your blood pressure rechecked as directed by your health care provider.   Take medicines only as directed by your health care provider. Follow the directions carefully. Blood pressure medicines must be taken as prescribed. The medicine does not work as well when you skip doses. Skipping doses also puts you at risk for problems.  Do not smoke.   Monitor your blood pressure at home as directed by your health care provider. SEEK MEDICAL CARE IF:   You think you are having a reaction to medicines taken.  You have recurrent headaches or feel dizzy.  You have swelling in your ankles.  You have trouble with your vision. SEEK IMMEDIATE MEDICAL CARE IF:  You develop a severe headache or confusion.  You have unusual weakness, numbness, or feel faint.  You have severe chest or abdominal pain.  You vomit repeatedly.  You have trouble breathing. MAKE SURE YOU:   Understand these instructions.  Will watch your condition.  Will get help right away if you are not doing well or get worse.   This information is not intended to replace advice given to you by your health care provider. Make sure you discuss any questions you have with your health care provider.   Document Released: 06/10/2005 Document Revised: 10/25/2014 Document Reviewed: 04/02/2013 Elsevier Interactive Patient Education 2016 [place] for Chronic Kidney Disease When your kidneys are not working well, they cannot remove waste and  excess substances from your blood as effectively as they did before. This can lead to a buildup and imbalance of these substances, which can affect how your body functions. This buildup can also make your kidneys work harder, causing even more damage. You may need to eat less of certain foods that can lead to the buildup of these substances in your body. By making the changes to your diet that are recommended by your dietitian or health care provider, you could possibly help prevent further kidney damage and delay or prevent the need for dialysis. The following information can help give you a basic understanding of these substances and how they affect your bodily functions. The information also gives examples of foods that contain the highest amounts of these substances. WHAT DO I NEED TO KNOW ABOUT SUBSTANCES IN MY FOOD THAT I MAY NEED TO ADJUST? Food adjustments will be different for each person with chronic kidney disease. It is important that you see a dietitian who can help you determine the specific adjustments that you will need to make for each of the following substances: Potassium Potassium affects how steadily your heart beats. If too much potassium builds up in your blood, it can cause an irregular heartbeat or even a heart attack. Examples of foods rich in potassium include:  Milk.  Fruits.  Vegetables. Phosphorus Phosphorus is a mineral found in your bones. A balance between calcium and phosphorous is needed to build and maintain healthy bones. Too much phosphorus pulls calcium from your bones. This can make your bones weak and more likely to break. Too much phosphorus can also make your skin itch. Examples of foods rich in phosphorus include:  Milk and cheese.  Dried beans.  Peas.  Colas.  Nuts and peanut butter. Animal Protein Animal protein helps you make and keep muscle. It also helps in the repair of your body's cells and tissues. One of the natural breakdown products  of protein is a waste product called urea. When your kidneys are not working properly, they cannot remove wastes such as urea like they did before you developed chronic kidney disease. You will likely need to limit the amount of protein you eat to help prevent a buildup of urea in your blood. Examples of animal protein include:  Meat (all types).  Fish and seafood.  Poultry.  Eggs. Sodium Sodium, which is found in salt, helps maintain a healthy balance of fluids in your body. Too much sodium can increase your blood pressure level and have a negative affect on the function of your heart and lungs. Too much sodium also can cause your body to retain too much fluid, making your kidneys work harder.  Examples of foods with high levels of sodium include:  Salt seasonings.  Soy sauce.  Cured and processed meats.  Salted crackers and snack foods.  Fast food.  Canned soups and most canned foods. Glucose Glucose provides energy for your body. If you have diabetes mellitus that is not properly controlled, you have too much glucose in your blood. Too much glucose in your blood can worsen the function of your kidneys by damaging small blood vessels. This prevents enough blood flow to your kidneys to give them what they need to work. If you have diabetes mellitus and chronic kidney disease, it is important to maintain your blood glucose at a level recommended by your health care provider. SHOULD I TAKE A VITAMIN AND MINERAL SUPPLEMENT? Because you may need to avoid eating certain foods, you may not get all of the vitamins and minerals that would normally come from those foods. Your health care provider or dietitian may recommend that you take a supplement to ensure that you get all of the vitamins and minerals that your body needs.    This information is not intended to replace advice given to you by your health care provider. Make sure you discuss any questions you have with your health care  provider.   Document Released: 08/31/2002 Document Revised: 07/01/2014 Document Reviewed: 05/07/2013 Elsevier Interactive Patient Education [employer].

## 2015-07-02 ENCOUNTER — Encounter: Payer: Self-pay | Admitting: Family Medicine

## 2015-07-03 DIAGNOSIS — D509 Iron deficiency anemia, unspecified: Secondary | ICD-10-CM | POA: Diagnosis not present

## 2015-07-03 DIAGNOSIS — N186 End stage renal disease: Secondary | ICD-10-CM | POA: Diagnosis not present

## 2015-07-03 DIAGNOSIS — N2581 Secondary hyperparathyroidism of renal origin: Secondary | ICD-10-CM | POA: Diagnosis not present

## 2015-07-03 DIAGNOSIS — D631 Anemia in chronic kidney disease: Secondary | ICD-10-CM | POA: Diagnosis not present

## 2015-07-03 DIAGNOSIS — E8770 Fluid overload, unspecified: Secondary | ICD-10-CM | POA: Diagnosis not present

## 2015-07-05 DIAGNOSIS — D509 Iron deficiency anemia, unspecified: Secondary | ICD-10-CM | POA: Diagnosis not present

## 2015-07-05 DIAGNOSIS — D631 Anemia in chronic kidney disease: Secondary | ICD-10-CM | POA: Diagnosis not present

## 2015-07-05 DIAGNOSIS — E8770 Fluid overload, unspecified: Secondary | ICD-10-CM | POA: Diagnosis not present

## 2015-07-05 DIAGNOSIS — N186 End stage renal disease: Secondary | ICD-10-CM | POA: Diagnosis not present

## 2015-07-05 DIAGNOSIS — N2581 Secondary hyperparathyroidism of renal origin: Secondary | ICD-10-CM | POA: Diagnosis not present

## 2015-07-06 ENCOUNTER — Ambulatory Visit: Payer: Medicare Other | Admitting: Neurology

## 2015-07-07 DIAGNOSIS — D631 Anemia in chronic kidney disease: Secondary | ICD-10-CM | POA: Diagnosis not present

## 2015-07-07 DIAGNOSIS — N2581 Secondary hyperparathyroidism of renal origin: Secondary | ICD-10-CM | POA: Diagnosis not present

## 2015-07-07 DIAGNOSIS — E8770 Fluid overload, unspecified: Secondary | ICD-10-CM | POA: Diagnosis not present

## 2015-07-07 DIAGNOSIS — N186 End stage renal disease: Secondary | ICD-10-CM | POA: Diagnosis not present

## 2015-07-07 DIAGNOSIS — D509 Iron deficiency anemia, unspecified: Secondary | ICD-10-CM | POA: Diagnosis not present

## 2015-07-08 DIAGNOSIS — N2581 Secondary hyperparathyroidism of renal origin: Secondary | ICD-10-CM | POA: Diagnosis not present

## 2015-07-08 DIAGNOSIS — N186 End stage renal disease: Secondary | ICD-10-CM | POA: Diagnosis not present

## 2015-07-08 DIAGNOSIS — D631 Anemia in chronic kidney disease: Secondary | ICD-10-CM | POA: Diagnosis not present

## 2015-07-08 DIAGNOSIS — D509 Iron deficiency anemia, unspecified: Secondary | ICD-10-CM | POA: Diagnosis not present

## 2015-07-08 DIAGNOSIS — E8770 Fluid overload, unspecified: Secondary | ICD-10-CM | POA: Diagnosis not present

## 2015-07-10 ENCOUNTER — Encounter: Payer: Self-pay | Admitting: Neurology

## 2015-07-10 DIAGNOSIS — D509 Iron deficiency anemia, unspecified: Secondary | ICD-10-CM | POA: Diagnosis not present

## 2015-07-10 DIAGNOSIS — E8770 Fluid overload, unspecified: Secondary | ICD-10-CM | POA: Diagnosis not present

## 2015-07-10 DIAGNOSIS — D631 Anemia in chronic kidney disease: Secondary | ICD-10-CM | POA: Diagnosis not present

## 2015-07-10 DIAGNOSIS — N186 End stage renal disease: Secondary | ICD-10-CM | POA: Diagnosis not present

## 2015-07-10 DIAGNOSIS — N2581 Secondary hyperparathyroidism of renal origin: Secondary | ICD-10-CM | POA: Diagnosis not present

## 2015-07-12 DIAGNOSIS — N2581 Secondary hyperparathyroidism of renal origin: Secondary | ICD-10-CM | POA: Diagnosis not present

## 2015-07-12 DIAGNOSIS — D631 Anemia in chronic kidney disease: Secondary | ICD-10-CM | POA: Diagnosis not present

## 2015-07-12 DIAGNOSIS — N186 End stage renal disease: Secondary | ICD-10-CM | POA: Diagnosis not present

## 2015-07-12 DIAGNOSIS — E8770 Fluid overload, unspecified: Secondary | ICD-10-CM | POA: Diagnosis not present

## 2015-07-12 DIAGNOSIS — D509 Iron deficiency anemia, unspecified: Secondary | ICD-10-CM | POA: Diagnosis not present

## 2015-07-14 DIAGNOSIS — E8770 Fluid overload, unspecified: Secondary | ICD-10-CM | POA: Diagnosis not present

## 2015-07-14 DIAGNOSIS — D631 Anemia in chronic kidney disease: Secondary | ICD-10-CM | POA: Diagnosis not present

## 2015-07-14 DIAGNOSIS — D509 Iron deficiency anemia, unspecified: Secondary | ICD-10-CM | POA: Diagnosis not present

## 2015-07-14 DIAGNOSIS — N186 End stage renal disease: Secondary | ICD-10-CM | POA: Diagnosis not present

## 2015-07-14 DIAGNOSIS — N2581 Secondary hyperparathyroidism of renal origin: Secondary | ICD-10-CM | POA: Diagnosis not present

## 2015-07-17 DIAGNOSIS — N2581 Secondary hyperparathyroidism of renal origin: Secondary | ICD-10-CM | POA: Diagnosis not present

## 2015-07-17 DIAGNOSIS — D509 Iron deficiency anemia, unspecified: Secondary | ICD-10-CM | POA: Diagnosis not present

## 2015-07-17 DIAGNOSIS — E8770 Fluid overload, unspecified: Secondary | ICD-10-CM | POA: Diagnosis not present

## 2015-07-17 DIAGNOSIS — N186 End stage renal disease: Secondary | ICD-10-CM | POA: Diagnosis not present

## 2015-07-17 DIAGNOSIS — D631 Anemia in chronic kidney disease: Secondary | ICD-10-CM | POA: Diagnosis not present

## 2015-07-19 DIAGNOSIS — N186 End stage renal disease: Secondary | ICD-10-CM | POA: Diagnosis not present

## 2015-07-19 DIAGNOSIS — N2581 Secondary hyperparathyroidism of renal origin: Secondary | ICD-10-CM | POA: Diagnosis not present

## 2015-07-19 DIAGNOSIS — D509 Iron deficiency anemia, unspecified: Secondary | ICD-10-CM | POA: Diagnosis not present

## 2015-07-19 DIAGNOSIS — E8770 Fluid overload, unspecified: Secondary | ICD-10-CM | POA: Diagnosis not present

## 2015-07-19 DIAGNOSIS — D631 Anemia in chronic kidney disease: Secondary | ICD-10-CM | POA: Diagnosis not present

## 2015-07-21 DIAGNOSIS — N2581 Secondary hyperparathyroidism of renal origin: Secondary | ICD-10-CM | POA: Diagnosis not present

## 2015-07-21 DIAGNOSIS — D631 Anemia in chronic kidney disease: Secondary | ICD-10-CM | POA: Diagnosis not present

## 2015-07-21 DIAGNOSIS — N186 End stage renal disease: Secondary | ICD-10-CM | POA: Diagnosis not present

## 2015-07-21 DIAGNOSIS — D509 Iron deficiency anemia, unspecified: Secondary | ICD-10-CM | POA: Diagnosis not present

## 2015-07-21 DIAGNOSIS — E8770 Fluid overload, unspecified: Secondary | ICD-10-CM | POA: Diagnosis not present

## 2015-07-24 DIAGNOSIS — N2581 Secondary hyperparathyroidism of renal origin: Secondary | ICD-10-CM | POA: Diagnosis not present

## 2015-07-24 DIAGNOSIS — E8770 Fluid overload, unspecified: Secondary | ICD-10-CM | POA: Diagnosis not present

## 2015-07-24 DIAGNOSIS — N186 End stage renal disease: Secondary | ICD-10-CM | POA: Diagnosis not present

## 2015-07-24 DIAGNOSIS — D631 Anemia in chronic kidney disease: Secondary | ICD-10-CM | POA: Diagnosis not present

## 2015-07-24 DIAGNOSIS — D509 Iron deficiency anemia, unspecified: Secondary | ICD-10-CM | POA: Diagnosis not present

## 2015-07-25 DIAGNOSIS — Z992 Dependence on renal dialysis: Secondary | ICD-10-CM | POA: Diagnosis not present

## 2015-07-25 DIAGNOSIS — N186 End stage renal disease: Secondary | ICD-10-CM | POA: Diagnosis not present

## 2015-07-25 DIAGNOSIS — T8610 Unspecified complication of kidney transplant: Secondary | ICD-10-CM | POA: Diagnosis not present

## 2015-07-26 DIAGNOSIS — N186 End stage renal disease: Secondary | ICD-10-CM | POA: Diagnosis not present

## 2015-07-26 DIAGNOSIS — N2581 Secondary hyperparathyroidism of renal origin: Secondary | ICD-10-CM | POA: Diagnosis not present

## 2015-07-28 DIAGNOSIS — N2581 Secondary hyperparathyroidism of renal origin: Secondary | ICD-10-CM | POA: Diagnosis not present

## 2015-07-28 DIAGNOSIS — N186 End stage renal disease: Secondary | ICD-10-CM | POA: Diagnosis not present

## 2015-07-31 DIAGNOSIS — N186 End stage renal disease: Secondary | ICD-10-CM | POA: Diagnosis not present

## 2015-07-31 DIAGNOSIS — N2581 Secondary hyperparathyroidism of renal origin: Secondary | ICD-10-CM | POA: Diagnosis not present

## 2015-08-02 DIAGNOSIS — N186 End stage renal disease: Secondary | ICD-10-CM | POA: Diagnosis not present

## 2015-08-02 DIAGNOSIS — N2581 Secondary hyperparathyroidism of renal origin: Secondary | ICD-10-CM | POA: Diagnosis not present

## 2015-08-04 DIAGNOSIS — N186 End stage renal disease: Secondary | ICD-10-CM | POA: Diagnosis not present

## 2015-08-04 DIAGNOSIS — N2581 Secondary hyperparathyroidism of renal origin: Secondary | ICD-10-CM | POA: Diagnosis not present

## 2015-08-07 DIAGNOSIS — N186 End stage renal disease: Secondary | ICD-10-CM | POA: Diagnosis not present

## 2015-08-07 DIAGNOSIS — N2581 Secondary hyperparathyroidism of renal origin: Secondary | ICD-10-CM | POA: Diagnosis not present

## 2015-08-09 DIAGNOSIS — N2581 Secondary hyperparathyroidism of renal origin: Secondary | ICD-10-CM | POA: Diagnosis not present

## 2015-08-09 DIAGNOSIS — N186 End stage renal disease: Secondary | ICD-10-CM | POA: Diagnosis not present

## 2015-08-11 DIAGNOSIS — N186 End stage renal disease: Secondary | ICD-10-CM | POA: Diagnosis not present

## 2015-08-11 DIAGNOSIS — N2581 Secondary hyperparathyroidism of renal origin: Secondary | ICD-10-CM | POA: Diagnosis not present

## 2015-08-14 DIAGNOSIS — N2581 Secondary hyperparathyroidism of renal origin: Secondary | ICD-10-CM | POA: Diagnosis not present

## 2015-08-14 DIAGNOSIS — N186 End stage renal disease: Secondary | ICD-10-CM | POA: Diagnosis not present

## 2015-08-16 DIAGNOSIS — N186 End stage renal disease: Secondary | ICD-10-CM | POA: Diagnosis not present

## 2015-08-16 DIAGNOSIS — N2581 Secondary hyperparathyroidism of renal origin: Secondary | ICD-10-CM | POA: Diagnosis not present

## 2015-08-18 DIAGNOSIS — N2581 Secondary hyperparathyroidism of renal origin: Secondary | ICD-10-CM | POA: Diagnosis not present

## 2015-08-18 DIAGNOSIS — N186 End stage renal disease: Secondary | ICD-10-CM | POA: Diagnosis not present

## 2015-08-21 DIAGNOSIS — N186 End stage renal disease: Secondary | ICD-10-CM | POA: Diagnosis not present

## 2015-08-21 DIAGNOSIS — N2581 Secondary hyperparathyroidism of renal origin: Secondary | ICD-10-CM | POA: Diagnosis not present

## 2015-08-22 DIAGNOSIS — T8610 Unspecified complication of kidney transplant: Secondary | ICD-10-CM | POA: Diagnosis not present

## 2015-08-22 DIAGNOSIS — Z992 Dependence on renal dialysis: Secondary | ICD-10-CM | POA: Diagnosis not present

## 2015-08-22 DIAGNOSIS — N186 End stage renal disease: Secondary | ICD-10-CM | POA: Diagnosis not present

## 2015-08-23 DIAGNOSIS — N2581 Secondary hyperparathyroidism of renal origin: Secondary | ICD-10-CM | POA: Diagnosis not present

## 2015-08-23 DIAGNOSIS — D509 Iron deficiency anemia, unspecified: Secondary | ICD-10-CM | POA: Diagnosis not present

## 2015-08-23 DIAGNOSIS — N186 End stage renal disease: Secondary | ICD-10-CM | POA: Diagnosis not present

## 2015-08-25 DIAGNOSIS — D509 Iron deficiency anemia, unspecified: Secondary | ICD-10-CM | POA: Diagnosis not present

## 2015-08-25 DIAGNOSIS — N186 End stage renal disease: Secondary | ICD-10-CM | POA: Diagnosis not present

## 2015-08-25 DIAGNOSIS — N2581 Secondary hyperparathyroidism of renal origin: Secondary | ICD-10-CM | POA: Diagnosis not present

## 2015-08-28 DIAGNOSIS — D509 Iron deficiency anemia, unspecified: Secondary | ICD-10-CM | POA: Diagnosis not present

## 2015-08-28 DIAGNOSIS — N2581 Secondary hyperparathyroidism of renal origin: Secondary | ICD-10-CM | POA: Diagnosis not present

## 2015-08-28 DIAGNOSIS — N186 End stage renal disease: Secondary | ICD-10-CM | POA: Diagnosis not present

## 2015-08-30 DIAGNOSIS — D509 Iron deficiency anemia, unspecified: Secondary | ICD-10-CM | POA: Diagnosis not present

## 2015-08-30 DIAGNOSIS — N186 End stage renal disease: Secondary | ICD-10-CM | POA: Diagnosis not present

## 2015-08-30 DIAGNOSIS — N2581 Secondary hyperparathyroidism of renal origin: Secondary | ICD-10-CM | POA: Diagnosis not present

## 2015-09-01 DIAGNOSIS — N186 End stage renal disease: Secondary | ICD-10-CM | POA: Diagnosis not present

## 2015-09-01 DIAGNOSIS — D509 Iron deficiency anemia, unspecified: Secondary | ICD-10-CM | POA: Diagnosis not present

## 2015-09-01 DIAGNOSIS — N2581 Secondary hyperparathyroidism of renal origin: Secondary | ICD-10-CM | POA: Diagnosis not present

## 2015-09-04 DIAGNOSIS — D509 Iron deficiency anemia, unspecified: Secondary | ICD-10-CM | POA: Diagnosis not present

## 2015-09-04 DIAGNOSIS — N186 End stage renal disease: Secondary | ICD-10-CM | POA: Diagnosis not present

## 2015-09-04 DIAGNOSIS — N2581 Secondary hyperparathyroidism of renal origin: Secondary | ICD-10-CM | POA: Diagnosis not present

## 2015-09-06 DIAGNOSIS — N2581 Secondary hyperparathyroidism of renal origin: Secondary | ICD-10-CM | POA: Diagnosis not present

## 2015-09-06 DIAGNOSIS — N186 End stage renal disease: Secondary | ICD-10-CM | POA: Diagnosis not present

## 2015-09-06 DIAGNOSIS — D509 Iron deficiency anemia, unspecified: Secondary | ICD-10-CM | POA: Diagnosis not present

## 2015-09-08 DIAGNOSIS — N186 End stage renal disease: Secondary | ICD-10-CM | POA: Diagnosis not present

## 2015-09-08 DIAGNOSIS — N2581 Secondary hyperparathyroidism of renal origin: Secondary | ICD-10-CM | POA: Diagnosis not present

## 2015-09-08 DIAGNOSIS — D509 Iron deficiency anemia, unspecified: Secondary | ICD-10-CM | POA: Diagnosis not present

## 2015-09-11 ENCOUNTER — Emergency Department (HOSPITAL_COMMUNITY): Payer: Medicare Other

## 2015-09-11 ENCOUNTER — Encounter (HOSPITAL_COMMUNITY): Payer: Self-pay | Admitting: Emergency Medicine

## 2015-09-11 ENCOUNTER — Observation Stay (HOSPITAL_COMMUNITY)
Admission: EM | Admit: 2015-09-11 | Discharge: 2015-09-13 | Disposition: A | Discharge status: Home / Self Care | Payer: Medicare Other | Attending: Oncology | Admitting: Oncology

## 2015-09-11 DIAGNOSIS — I160 Hypertensive urgency: Secondary | ICD-10-CM | POA: Diagnosis not present

## 2015-09-11 DIAGNOSIS — Z79899 Other long term (current) drug therapy: Secondary | ICD-10-CM | POA: Insufficient documentation

## 2015-09-11 DIAGNOSIS — I120 Hypertensive chronic kidney disease with stage 5 chronic kidney disease or end stage renal disease: Secondary | ICD-10-CM | POA: Diagnosis not present

## 2015-09-11 DIAGNOSIS — R51 Headache: Secondary | ICD-10-CM | POA: Diagnosis present

## 2015-09-11 DIAGNOSIS — Z992 Dependence on renal dialysis: Secondary | ICD-10-CM | POA: Insufficient documentation

## 2015-09-11 DIAGNOSIS — H538 Other visual disturbances: Secondary | ICD-10-CM | POA: Diagnosis not present

## 2015-09-11 DIAGNOSIS — N186 End stage renal disease: Secondary | ICD-10-CM | POA: Diagnosis not present

## 2015-09-11 DIAGNOSIS — N2581 Secondary hyperparathyroidism of renal origin: Secondary | ICD-10-CM | POA: Diagnosis not present

## 2015-09-11 DIAGNOSIS — Z7952 Long term (current) use of systemic steroids: Secondary | ICD-10-CM | POA: Insufficient documentation

## 2015-09-11 DIAGNOSIS — R0602 Shortness of breath: Secondary | ICD-10-CM | POA: Diagnosis not present

## 2015-09-11 DIAGNOSIS — D509 Iron deficiency anemia, unspecified: Secondary | ICD-10-CM | POA: Diagnosis not present

## 2015-09-11 LAB — CBC WITH DIFFERENTIAL/PLATELET
BASOS ABS: 0 10*3/uL (ref 0.0–0.1)
BASOS PCT: 0 %
EOS PCT: 1 %
Eosinophils Absolute: 0.1 10*3/uL (ref 0.0–0.7)
HCT: 30.3 % — ABNORMAL LOW (ref 36.0–46.0)
Hemoglobin: 9.7 g/dL — ABNORMAL LOW (ref 12.0–15.0)
Lymphocytes Relative: 10 %
Lymphs Abs: 0.9 10*3/uL (ref 0.7–4.0)
MCH: 30.8 pg (ref 26.0–34.0)
MCHC: 32 g/dL (ref 30.0–36.0)
MCV: 96.2 fL (ref 78.0–100.0)
MONO ABS: 0.3 10*3/uL (ref 0.1–1.0)
Monocytes Relative: 4 %
Neutro Abs: 7.6 10*3/uL (ref 1.7–7.7)
Neutrophils Relative %: 85 %
PLATELETS: 170 10*3/uL (ref 150–400)
RBC: 3.15 MIL/uL — ABNORMAL LOW (ref 3.87–5.11)
RDW: 17.5 % — AB (ref 11.5–15.5)
WBC: 8.9 10*3/uL (ref 4.0–10.5)

## 2015-09-11 LAB — BASIC METABOLIC PANEL
ANION GAP: 14 (ref 5–15)
BUN: 12 mg/dL (ref 6–20)
CALCIUM: 9.8 mg/dL (ref 8.9–10.3)
CO2: 29 mmol/L (ref 22–32)
CREATININE: 5.95 mg/dL — AB (ref 0.44–1.00)
Chloride: 98 mmol/L — ABNORMAL LOW (ref 101–111)
GFR, EST AFRICAN AMERICAN: 10 mL/min — AB (ref >60)
GFR, EST NON AFRICAN AMERICAN: 9 mL/min — AB (ref >60)
GLUCOSE: 98 mg/dL (ref 65–99)
Potassium: 4.2 mmol/L (ref 3.5–5.1)
Sodium: 141 mmol/L (ref 135–145)

## 2015-09-11 LAB — I-STAT TROPONIN, ED: Troponin i, poc: 0.14 ng/mL (ref 0.00–0.08)

## 2015-09-11 LAB — TROPONIN I: TROPONIN I: 0.16 ng/mL — AB (ref <0.031)

## 2015-09-11 MED ORDER — HYDRALAZINE HCL 20 MG/ML IJ SOLN: 10.0000 mg | Freq: Four times a day (QID) | INTRAMUSCULAR | Status: DC | PRN

## 2015-09-11 MED ORDER — HEPARIN SODIUM (PORCINE) 5000 UNIT/ML IJ SOLN
5000.0000 [IU] | Freq: Three times a day (TID) | INTRAMUSCULAR | Status: DC
  Filled 2015-09-11 (×3): qty 1

## 2015-09-11 MED ORDER — LABETALOL HCL 200 MG PO TABS
200.0000 mg | ORAL_TABLET | Freq: Two times a day (BID) | ORAL | Status: DC
  Administered 2015-09-11 – 2015-09-13 (×4): 200 mg via ORAL
  Filled 2015-09-11 (×4): qty 1

## 2015-09-11 MED ORDER — ACETAMINOPHEN 325 MG PO TABS
650.0000 mg | ORAL_TABLET | Freq: Four times a day (QID) | ORAL | Status: DC | PRN
  Administered 2015-09-11 – 2015-09-13 (×2): 650 mg via ORAL
  Filled 2015-09-11: qty 2

## 2015-09-11 MED ORDER — AMLODIPINE BESYLATE 10 MG PO TABS
10.0000 mg | ORAL_TABLET | Freq: Every day | ORAL | Status: DC
  Administered 2015-09-12: 10 mg via ORAL
  Filled 2015-09-11: qty 1

## 2015-09-11 MED ORDER — NICARDIPINE HCL IN NACL 20-0.86 MG/200ML-% IV SOLN
3.0000 mg/h | Freq: Once | INTRAVENOUS | Status: AC
  Administered 2015-09-11: 3 mg/h via INTRAVENOUS
  Filled 2015-09-11: qty 200

## 2015-09-11 MED ORDER — LABETALOL HCL 5 MG/ML IV SOLN
10.0000 mg | INTRAVENOUS | Status: DC | PRN
  Administered 2015-09-12 (×2): 10 mg via INTRAVENOUS
  Filled 2015-09-11 (×3): qty 4

## 2015-09-11 MED ORDER — LABETALOL HCL 5 MG/ML IV SOLN
20.0000 mg | Freq: Once | INTRAVENOUS | Status: AC
  Administered 2015-09-11: 20 mg via INTRAVENOUS
  Filled 2015-09-11: qty 4

## 2015-09-11 MED ORDER — HYDRALAZINE HCL 25 MG PO TABS: 50.0000 mg | ORAL_TABLET | Freq: Two times a day (BID) | ORAL | Status: DC

## 2015-09-11 MED ORDER — PROCHLORPERAZINE EDISYLATE 5 MG/ML IJ SOLN
5.0000 mg | Freq: Once | INTRAMUSCULAR | Status: AC
  Administered 2015-09-11: 5 mg via INTRAVENOUS
  Filled 2015-09-11: qty 2

## 2015-09-11 MED ORDER — HYDRALAZINE HCL 50 MG PO TABS
50.0000 mg | ORAL_TABLET | Freq: Two times a day (BID) | ORAL | Status: DC
  Administered 2015-09-12 – 2015-09-13 (×3): 50 mg via ORAL
  Filled 2015-09-11 (×3): qty 1

## 2015-09-11 MED ORDER — OXYCODONE-ACETAMINOPHEN 5-325 MG PO TABS
ORAL_TABLET | ORAL | Status: AC
  Filled 2015-09-11: qty 1

## 2015-09-11 MED ORDER — RENA-VITE PO TABS
1.0000 | ORAL_TABLET | Freq: Every day | ORAL | Status: DC
Start: 2015-09-11 — End: 2015-09-13
  Administered 2015-09-11 – 2015-09-12 (×2): 1 via ORAL
  Filled 2015-09-11 (×2): qty 1

## 2015-09-11 MED ORDER — DIPHENHYDRAMINE HCL 50 MG/ML IJ SOLN
12.5000 mg | Freq: Once | INTRAMUSCULAR | Status: AC
  Administered 2015-09-11: 12.5 mg via INTRAVENOUS
  Filled 2015-09-11: qty 1

## 2015-09-11 MED ORDER — KETOROLAC TROMETHAMINE 30 MG/ML IJ SOLN
30.0000 mg | Freq: Once | INTRAMUSCULAR | Status: AC
  Administered 2015-09-11: 30 mg via INTRAVENOUS
  Filled 2015-09-11: qty 1

## 2015-09-11 MED ORDER — SODIUM CHLORIDE 0.9% FLUSH
3.0000 mL | Freq: Two times a day (BID) | INTRAVENOUS | Status: DC
  Administered 2015-09-12 – 2015-09-13 (×3): 3 mL via INTRAVENOUS

## 2015-09-11 MED ORDER — PREDNISONE 5 MG PO TABS
5.0000 mg | ORAL_TABLET | Freq: Every day | ORAL | Status: DC
  Administered 2015-09-12 – 2015-09-13 (×2): 5 mg via ORAL
  Filled 2015-09-11 (×2): qty 1

## 2015-09-11 MED ORDER — SODIUM CHLORIDE 0.9 % IV BOLUS (SEPSIS)
250.0000 mL | Freq: Once | INTRAVENOUS | Status: AC
  Administered 2015-09-11: 250 mL via INTRAVENOUS

## 2015-09-11 MED ORDER — OXYCODONE-ACETAMINOPHEN 5-325 MG PO TABS
1.0000 | ORAL_TABLET | ORAL | Status: DC | PRN
Start: 2015-09-11 — End: 2015-09-11
  Administered 2015-09-11: 1 via ORAL

## 2015-09-11 MED ORDER — TACROLIMUS 1 MG PO CAPS
3.0000 mg | ORAL_CAPSULE | Freq: Two times a day (BID) | ORAL | Status: DC
Start: 2015-09-11 — End: 2015-09-12
  Administered 2015-09-11 – 2015-09-12 (×2): 3 mg via ORAL
  Filled 2015-09-11 (×2): qty 3

## 2015-09-11 MED ORDER — ACETAMINOPHEN 650 MG RE SUPP: 650.0000 mg | Freq: Four times a day (QID) | RECTAL | Status: DC | PRN

## 2015-09-11 MED ORDER — CALCIUM ACETATE (PHOS BINDER) 667 MG PO CAPS
2001.0000 mg | ORAL_CAPSULE | Freq: Three times a day (TID) | ORAL | Status: DC
  Administered 2015-09-12 – 2015-09-13 (×4): 2001 mg via ORAL
  Filled 2015-09-11 (×4): qty 3

## 2015-09-11 MED ORDER — SENNOSIDES-DOCUSATE SODIUM 8.6-50 MG PO TABS
1.0000 | ORAL_TABLET | Freq: Every evening | ORAL | Status: DC | PRN
Start: 2015-09-11 — End: 2015-09-13

## 2015-09-11 NOTE — ED Provider Notes (Signed)
CSN: [id number]     Arrival date & time 09/11/15  1130 History   First MD Initiated Contact with Patient 09/11/15 1321     Chief Complaint  Patient presents with  . Blurred Vision  . Headache     (Consider location/radiation/quality/duration/timing/severity/associated sxs/prior Treatment) HPI  Social 30 year old female with a past medical history of end-stage renal disease status post kidney transplant which failed. Patient is still on antirejection medications. She was dialyzed today and presents for severe headache and hypertension. She has a history of severe and uncontrolled hypertension. She states that she took her labetalol this morning. She states that she always gets severe headaches after dialysis. She states that this one is the same as her usual headache. His associated blurred vision without photophobia, fever or other signs of meningitis. She denies sudden onset of headache. She describes it as global, throbbing. Some associated nausea. She did not take any medication prior to coming to the ED. She has been evaluated for this seem complaint multiple times. Last head CT was in December, which was negative. She was given a single Percocet and ibuprofen with no relief for her symptoms   Past Medical History  Diagnosis Date  . Hypertension   . History of kidney transplant 2012    kidney failure due to hypertension  . Dialysis patient ([place])   . Chronic kidney disease     previous hx dialysis   Past Surgical History  Procedure Laterality Date  . Kidney transplant Bilateral 2012   Family History  Problem Relation Age of Onset  . Family history unknown: Yes   Social History  Substance Use Topics  . Smoking status: Never Smoker   . Smokeless tobacco: Never Used  . Alcohol Use: No   OB History    Gravida Para Term Preterm AB TAB SAB Ectopic Multiple Living   1    1  1    0     Review of Systems    Allergies  Review of patient's allergies indicates no known  allergies.  Home Medications   Prior to Admission medications   Medication Sig Start Date End Date Taking? Authorizing Provider  acetaminophen (TYLENOL) 500 MG tablet Take 1 tablet (500 mg total) by mouth every 6 (six) hours as needed. 06/30/15   [name], FNP  amLODipine (NORVASC) 10 MG tablet Take 10 mg by mouth daily.    Historical Provider, MD  calcium acetate (PHOSLO) 667 MG capsule Take 2,001 mg by mouth 3 (three) times daily with meals. 05/29/15   Historical Provider, MD  hydrALAZINE (APRESOLINE) 50 MG tablet Take 50 mg by mouth 2 (two) times daily. 06/01/15   Historical Provider, MD  labetalol (NORMODYNE) 200 MG tablet Take 1 tablet (200 mg total) by mouth 2 (two) times daily. 06/08/15   Debbe Odea, MD  losartan (COZAAR) 100 MG tablet Take 1 tablet (100 mg total) by mouth daily. 02/18/15   [name], MD  multivitamin (RENA-VIT) TABS tablet Take 1 tablet by mouth at bedtime. 06/09/15   Debbe Odea, MD  predniSONE (DELTASONE) 5 MG tablet Take 5 mg by mouth daily with breakfast.    Historical Provider, MD  tacrolimus (PROGRAF) 1 MG capsule Take 3 mg by mouth 2 (two) times daily.     Historical Provider, MD   BP 190/121 mmHg  Pulse 108  Temp(Src) 98.1 F (36.7 C) (Oral)  Resp 26  SpO2 100% Physical Exam  Constitutional: She is oriented to person, place, and time. She appears  well-developed and well-nourished. No distress.  HENT:  Head: Normocephalic and atraumatic.  Mouth/Throat: Oropharynx is clear and moist.  Eyes: Conjunctivae and EOM are normal. Pupils are equal, round, and reactive to light. No scleral icterus.  No horizontal, vertical or rotational nystagmus  Neck: Normal range of motion. Neck supple.  Full active and passive ROM without pain No midline or paraspinal tenderness No nuchal rigidity or meningeal signs  Cardiovascular: Normal rate, regular rhythm, normal heart sounds and intact distal pulses.  Exam reveals no gallop and no friction rub.   No murmur  heard. Pulmonary/Chest: Effort normal and breath sounds normal. No respiratory distress. She has no wheezes. She has no rales.  Abdominal: Soft. Bowel sounds are normal. She exhibits no distension and no mass. There is no tenderness. There is no rebound and no guarding.  Musculoskeletal: Normal range of motion.  Lymphadenopathy:    She has no cervical adenopathy.  Neurological: She is oriented to person, place, and time. She has normal reflexes. No cranial nerve deficit. She exhibits normal muscle tone. Coordination normal.  Mental Status:  Patient somnolent but orientced. thought content appropriate. Speech fluent without evidence of aphasia. Able to follow 2 step commands without difficulty.  Cranial Nerves:  II:  Peripheral visual fields grossly normal, pupils equal, round, reactive to light III,IV, VI: ptosis not present, extra-ocular motions intact bilaterally  V,VII: smile symmetric, facial light touch sensation equal VIII: hearing grossly normal bilaterally  IX,X: midline uvula rise  XI: bilateral shoulder shrug equal and strong XII: midline tongue extension  Motor:  5/5 in upper and lower extremities bilaterally including strong and equal grip strength and dorsiflexion/plantar flexion Sensory: Pinprick and light touch normal in all extremities.  Deep Tendon Reflexes: 2+ and symmetric  Cerebellar: normal finger-to-nose with bilateral upper extremities Gait: deferred CV: distal pulses palpable throughout   Skin: Skin is warm and dry. No rash noted. She is not diaphoretic.  Psychiatric: She has a normal mood and affect. Her behavior is normal. Judgment and thought content normal.  Nursing note and vitals reviewed.   ED Course  Procedures (including critical care time) Labs Review Labs Reviewed - No data to display  Imaging Review No results found. I have personally reviewed and evaluated these images and lab results as part of my medical decision-making.   EKG  Interpretation None      MDM   Final diagnoses:  None    4:06 PM Patient given labetalol, Toradol, Compazine and Benadryl. Her headache is resolved. However, she continues to have hypertension. Given report to Dr. Ralene Bathe will assume care of the patient for discharge. We'll obtain labs and CT head to rule out bleed. She has a long-standing history of headaches. Her hypertension patellar at risk for intracranial bleed, however.    Margarita Mail, PA-C 09/11/15 [place], DO 09/11/15 2105

## 2015-09-11 NOTE — ED Provider Notes (Signed)
Pt here with HA/SOB, has ESRD on HD, last session today.  Pt altered on exam but currently denies HA, SpO2 83% on RA, BP 220/141.  Concern for hypertensive urgency.  Started on cardene gtt, supplemental oxygen.  CT head negative for acute abnormality.  IM consulted for admission for further treatment.    CRITICAL CARE Performed by: Quintella Reichert   Total critical care time: 30 minutes  Critical care time was exclusive of separately billable procedures and treating other patients.  Critical care was necessary to treat or prevent imminent or life-threatening deterioration.  Critical care was time spent personally by me on the following activities: development of treatment plan with patient and/or surrogate as well as nursing, discussions with consultants, evaluation of patient's response to treatment, examination of patient, obtaining history from patient or surrogate, ordering and performing treatments and interventions, ordering and review of laboratory studies, ordering and review of radiographic studies, pulse oximetry and re-evaluation of patient's condition.   Quintella Reichert, MD 09/11/15 2159

## 2015-09-11 NOTE — ED Notes (Signed)
EDP at bedside attempting ultrasound IV.

## 2015-09-11 NOTE — ED Notes (Signed)
Pt. States her head feels better, but her vision is still blurry. Pt. Resting. BP remains very high. EDP notified.

## 2015-09-11 NOTE — ED Notes (Signed)
Pt here after dialysis appointment today; pt sts blurry vision and HA; pt noted to be htn and sts hx of same; pt hyperventilating at present

## 2015-09-11 NOTE — ED Notes (Signed)
Attempted IV unsuccessfully 2x. Will have another RN attempt.

## 2015-09-11 NOTE — ED Provider Notes (Signed)
Medical screening examination/treatment/procedure(s) were conducted as a shared visit with non-physician practitioner(s) and myself.  I personally evaluated the patient during the encounter.   EKG Interpretation None       See the written copy of this report in the patient's paper medical record.  These results did not interface directly into the electronic medical record and are summarized here.  30 yo F with a chief complaint of a headache. This is right sided worse with bright lights and loud noises. Patient has had multiple episodes of this usually happening after dialysis. Had dialysis today and then had this event occur. Typical of her prior headaches. Not going away with her normal home narcotic regimen. Neurologically intact.  Feel this is likely chronic htn with migraine instead of HTN emergency. Will attempt to treat with migraine cocktail and iv antihypertensives.   Procedure note: Ultrasound Guided Peripheral IV Ultrasound guided peripheral 1.88 inch angiocath IV placement performed by me. Indications: Nursing unable to place IV. Details: The antecubital fossa and upper arm were evaluated with a multifrequency linear probe. Patent brachial veins were noted. 1 attempt was made to cannulate a vein under realtime US guidance with successful cannulation of the vein and catheter placement. There is return of non-pulsatile dark red blood. The patient tolerated the procedure well without complications. Images archived electronically.  CPT codes: [phone] and [place], DO 09/11/15 2106

## 2015-09-11 NOTE — H&P (Signed)
Date: 09/11/2015               Patient Name:  Kerry Cook MRN: [id number]  DOB: 11-11-1985 Age / Sex: 30 y.o., female   PCP: No Pcp Per Patient         Medical Service: Internal Medicine Teaching Service         Attending Physician: Dr. Annia Belt, MD    First Contact: Dr. Marlowe Sax Pager: [phone]  Second Contact: Dr. Randell Patient  Pager: [phone]       After Hours (After 5p/  First Contact Pager: [phone]  weekends / holidays): Second Contact Pager: [phone]   Chief Complaint: severe headache  History of Present Illness: patient is a 30 yo F with a PMHx of HTN, and CKD s/p renal transplant 2/2 HTN presenting with acute onset headache which started after dialysis today. Patient describes the headache as right frontal in location, constant, 10/10 intensity, and throbbing in character. States the headache is associated with blurry vision, photophobia, phonophobia, dizziness, nausea, and vomiting. She vomited once today. States her Ara Kussmaul has been blurry for the past 1 week even prior to the headache starting. Denies any associated neck stiffness, nausea, vomiting, focal weakness or numbness, gait problems, or loss of consciousness. States Tylenol helped. Denies any prior history of headaches. She also mentions being short of breath last night and states her dyspnea resolved after dialysis. States she has had dyspnea in the past and it always resolved after dialysis. Denies having any SOB or CP at present. Denies having any fevers, chills, cough, sore throat, or viral URI symptoms. Denies any recent travel or OCP use.  As per ED provider documentation, patient has been evaluated multiples times in the past for headaches and uncontrolled HTN. She reports taking Norvasc, Hydralazine, and Labetolol at home and does admit to missing doses of her medications on a regular basis. States she eats "all kinds of stuff." CT head done did not show any acute intracranial abnormality. BP 227/156 in the ED.  Satting 88% on RA initially and was placed on supplemental O2. She was started on labetolol, toradol, compazine, and benadryl in the ED.  Meds: Current Facility-Administered Medications  Medication Dose Route Frequency Provider Last Rate Last Dose  . [START ON 09/12/2015] amLODipine (NORVASC) tablet 10 mg  10 mg Oral Daily Milagros Loll, MD      . Derrill Memo ON 09/12/2015] calcium acetate (PHOSLO) capsule 2,001 mg  2,001 mg Oral TID WC Milagros Loll, MD      . Derrill Memo ON 09/12/2015] hydrALAZINE (APRESOLINE) tablet 50 mg  50 mg Oral BID Milagros Loll, MD      . labetalol (NORMODYNE) tablet 200 mg  200 mg Oral BID Milagros Loll, MD      . labetalol (NORMODYNE,TRANDATE) injection 10 mg  10 mg Intravenous Q4H PRN Milagros Loll, MD      . multivitamin (RENA-VIT) tablet 1 tablet  1 tablet Oral QHS Milagros Loll, MD      . oxyCODONE-acetaminophen (PERCOCET/ROXICET) 5-325 MG per tablet 1 tablet  1 tablet Oral Q30 min PRN Lajean Saver, MD   1 tablet at 09/11/15 1222  . oxyCODONE-acetaminophen (PERCOCET/ROXICET) 5-325 MG per tablet           . [START ON 09/12/2015] predniSONE (DELTASONE) tablet 5 mg  5 mg Oral Q breakfast Milagros Loll, MD      . tacrolimus (PROGRAF) capsule 3 mg  3 mg Oral BID Tobie Lords  Randell Patient, MD       Current Outpatient Prescriptions  Medication Sig Dispense Refill  . acetaminophen (TYLENOL) 500 MG tablet Take 1 tablet (500 mg total) by mouth every 6 (six) hours as needed. 30 tablet 0  . amLODipine (NORVASC) 10 MG tablet Take 10 mg by mouth daily.    . calcium acetate (PHOSLO) 667 MG capsule Take 2,001 mg by mouth 3 (three) times daily with meals.  3  . hydrALAZINE (APRESOLINE) 50 MG tablet Take 50 mg by mouth 2 (two) times daily.  6  . labetalol (NORMODYNE) 200 MG tablet Take 1 tablet (200 mg total) by mouth 2 (two) times daily. 60 tablet 0  . losartan (COZAAR) 100 MG tablet Take 1 tablet (100 mg total) by mouth daily. 30 tablet 0  . multivitamin (RENA-VIT) TABS tablet  Take 1 tablet by mouth at bedtime. 30 each 0  . predniSONE (DELTASONE) 5 MG tablet Take 5 mg by mouth daily with breakfast.    . tacrolimus (PROGRAF) 1 MG capsule Take 3 mg by mouth 2 (two) times daily.     . [DISCONTINUED] medroxyPROGESTERone (DEPO-PROVERA) 150 MG/ML injection Inject 1 mL (150 mg total) into the muscle every 3 (three) months. (Patient not taking: Reported on 03/21/2015) 1 mL 3    Allergies: Allergies as of 09/11/2015  . (No Known Allergies)   Past Medical History  Diagnosis Date  . Hypertension   . History of kidney transplant 2012    kidney failure due to hypertension  . Dialysis patient ([place])   . Chronic kidney disease     previous hx dialysis   Past Surgical History  Procedure Laterality Date  . Kidney transplant Bilateral 2012   Family History  Problem Relation Age of Onset  . Family history unknown: Yes   Social History   Social History  . Marital Status: Single    Spouse Name: N/A  . Number of Children: N/A  . Years of Education: N/A   Occupational History  . Not on file.   Social History Main Topics  . Smoking status: Never Smoker   . Smokeless tobacco: Never Used  . Alcohol Use: No  . Drug Use: No  . Sexual Activity:    Partners: Male    Birth Control/ Protection: Condom, None   Other Topics Concern  . Not on file   Social History Narrative    Review of Systems: Review of Systems  Constitutional: Negative for fever and chills.  HENT: Negative for congestion, ear pain and sore throat.   Eyes: Positive for blurred vision. Negative for pain.  Respiratory: Negative for cough, shortness of breath and wheezing.   Cardiovascular: Negative for chest pain, palpitations and leg swelling.  Gastrointestinal: Positive for nausea and vomiting. Negative for abdominal pain, diarrhea and constipation.  Musculoskeletal: Negative for joint pain and neck pain.  Skin: Negative for itching and rash.  Neurological: Positive for dizziness and headaches.  Negative for sensory change, focal weakness and loss of consciousness.    Physical Exam: Blood pressure 133/91, pulse 104, temperature 98.1 F (36.7 C), temperature source Oral, resp. rate 22, SpO2 100 %, unknown if currently breastfeeding. Physical Exam  Constitutional: She is oriented to person, place, and time. She appears well-developed and well-nourished. No distress.  HENT:  Head: Normocephalic and atraumatic.  Mouth/Throat: Oropharynx is clear and moist.  Eyes: EOM are normal. Pupils are equal, round, and reactive to light.  Conjunctival injection bilaterally   Neck: Neck supple. No tracheal deviation present.  Cardiovascular: Regular rhythm and intact distal pulses.  Exam reveals no gallop and no friction rub.   No murmur heard. Tachycardic   Pulmonary/Chest: Effort normal and breath sounds normal. No respiratory distress. She has no wheezes. She has no rales.  Abdominal: Soft. Bowel sounds are normal. She exhibits no distension. There is no tenderness.  Musculoskeletal: She exhibits no edema or tenderness.  Neurological: She is alert and oriented to person, place, and time. No cranial nerve deficit.  Strength and sensation grossly intact in bilateral upper and lower extremities. No nuchal rigidity  Skin: Skin is warm and dry. No rash noted. She is not diaphoretic. No erythema.   Lab results: Basic Metabolic Panel:  Recent Labs  09/11/15 1607  NA 141  K 4.2  CL 98*  CO2 29  GLUCOSE 98  BUN 12  CREATININE 5.95*  CALCIUM 9.8   CBC:  Recent Labs  09/11/15 1607  WBC 8.9  NEUTROABS 7.6  HGB 9.7*  HCT 30.3*  MCV 96.2  PLT 170   Urine Drug Screen: Drugs of Abuse     Component Value Date/Time   LABOPIA NEG 06/02/2014 1103   COCAINSCRNUR NEG 06/02/2014 1103   LABBENZ NEG 06/02/2014 1103   LABBENZ NEGATIVE 08/26/2007 0820   AMPHETMU NEG 06/02/2014 1103   AMPHETMU NEGATIVE 08/26/2007 0820   THCU NEG 06/02/2014 1103   LABBARB NEG 06/02/2014 1103    Imaging  results:  Ct Head Wo Contrast  09/11/2015  CLINICAL DATA:  Persistent headache. EXAM: CT HEAD WITHOUT CONTRAST TECHNIQUE: Contiguous axial images were obtained from the base of the skull through the vertex without intravenous contrast. COMPARISON:  06/07/2015 FINDINGS: No mass lesion. No midline shift. No acute hemorrhage or hematoma. No extra-axial fluid collections. No evidence of acute infarction. Brain parenchyma is normal. Bones are normal. IMPRESSION: Normal exam. Electronically Signed   By: Lorriane Shire M.D.   On: 09/11/2015 16:44   Dg Chest Port 1 View  09/11/2015  CLINICAL DATA:  Shortness of breath, hypertension for 2 days, history of kidney transplant, dialysis EXAM: PORTABLE CHEST 1 VIEW COMPARISON:  02/16/2015 FINDINGS: Cardiomegaly is noted. Central mild vascular congestion. Mild interstitial prominence bilateral suspicious for mild interstitial edema. No segmental infiltrate or pleural effusion IMPRESSION: Cardiomegaly. Mild interstitial prominence bilaterally suspicious for mild interstitial edema. No segmental infiltrate or pleural effusion. Electronically Signed   By: Lahoma Crocker M.D.   On: 09/11/2015 16:33    Other results: EKG: NSR. LVH. QTc 495.   Assessment & Plan by Problem: Active Problems:   Hypertensive urgency  Hypertensive urgency Patient has a history of uncontrolled hypertension and has been evaluated multiples times in the past. BP 227/156 in the ED. Patient presented with acute onset severe headache, blurry vision, nausea/ vomiting, photophobia, and phonophobia. Neuro exam grossly normal. CT head did not show any acute intracranial abnormality. Her current presentation is likely in the setting of uncontrolled hypertension 2/2 medication non-compliance. She was started on IV labetolol, toradol, compazine, and benadryl in the ED. Patient reported significant improvement of her headache after these medications were started. BP had reduced to systolic [id number] and diastolic  0000000 when we saw the patient.  -Admit to telemetry  -Labetalol 200 mg BID -Hydralazine 50 mg BID -Amlodipine 10 mg daily  -Give Labetalol 10 mg q4 prn for systolic BP [id number], diastolic [id number]  -Percocet [id number] mg prn headaches  -Tylenol prn  -F/u am renal function panel   Dyspnea O2 saturation 88% on RA upon admission and  patient was started on supplemental O2. CXR showing mild interstitial edema. No segmental infiltrate or pleural effusion. She did not appear fluid overloaded on exam. Lungs CTAB. She denies any CP or cough. Afebrile and no leukocytosis. Wells score +1.5 (tachycardia) making PE less likely.  -Supplemental O2 as needed -Monitor pulse ox and vital signs  -F/u CBC in am   Elevated troponin Istat troponin 0.14. Patient denies having any CP. No acute ST/T wave changes on EKG.  -Continue to trend troponin  -F/u am EKG  ESRD and renal transplant on HD (MWF) ESRD 2/2 hx of uncontrolled HTN. Patient was dialyzed prior to admission today. -Monitor BMET -Tacrolimus 3 mg BID -Prednisone 5 mg daily  -Rena-vit daily -Phoslo TID -F/u am renal function panel  -Monitor I/Os  Anemia of chronic disease Hgb 9.7, MCV 96.2. Ferritin high at 2161 in 12/2014. Likely in the setting of ESRD.  DVT ppx: Subcutaneous heparin   Diet: renal   Dispo: Disposition is deferred at this time, awaiting improvement of current medical problems. Anticipated discharge in approximately 2-3 day(s).   The patient does have a current PCP (No Pcp Per Patient) and does need an [facility] hospital follow-up appointment after discharge.  The patient does not have transportation limitations that hinder transportation to clinic appointments.  Signed: [name], MD 09/11/2015, 6:46 PM

## 2015-09-11 NOTE — ED Notes (Addendum)
Pt. O2 saturation down to 85%. RN checked on patient and placed pt. On 2L oxygen via [place]. EDP at bedside. Pt. Significant other sts that pt. Has been  Having trouble breathing lately and worse last night. Pt. Also sts vision is still blurry. BP still elevated.

## 2015-09-11 NOTE — ED Notes (Addendum)
Cardene gtt titrated to 2.5 mg/hr to wean prior to patient being taken to inpatient room.

## 2015-09-11 NOTE — ED Notes (Signed)
Internal medicine at bedside

## 2015-09-12 DIAGNOSIS — I160 Hypertensive urgency: Principal | ICD-10-CM

## 2015-09-12 DIAGNOSIS — R778 Other specified abnormalities of plasma proteins: Secondary | ICD-10-CM

## 2015-09-12 DIAGNOSIS — I120 Hypertensive chronic kidney disease with stage 5 chronic kidney disease or end stage renal disease: Secondary | ICD-10-CM

## 2015-09-12 DIAGNOSIS — T8612 Kidney transplant failure: Secondary | ICD-10-CM

## 2015-09-12 DIAGNOSIS — N186 End stage renal disease: Secondary | ICD-10-CM

## 2015-09-12 DIAGNOSIS — Z992 Dependence on renal dialysis: Secondary | ICD-10-CM

## 2015-09-12 DIAGNOSIS — D631 Anemia in chronic kidney disease: Secondary | ICD-10-CM

## 2015-09-12 DIAGNOSIS — R0600 Dyspnea, unspecified: Secondary | ICD-10-CM

## 2015-09-12 DIAGNOSIS — H538 Other visual disturbances: Secondary | ICD-10-CM | POA: Diagnosis not present

## 2015-09-12 DIAGNOSIS — I169 Hypertensive crisis, unspecified: Secondary | ICD-10-CM | POA: Diagnosis not present

## 2015-09-12 LAB — CBC
HCT: 28.7 % — ABNORMAL LOW (ref 36.0–46.0)
HEMOGLOBIN: 9 g/dL — AB (ref 12.0–15.0)
MCH: 30.5 pg (ref 26.0–34.0)
MCHC: 31.4 g/dL (ref 30.0–36.0)
MCV: 97.3 fL (ref 78.0–100.0)
Platelets: 191 10*3/uL (ref 150–400)
RBC: 2.95 MIL/uL — AB (ref 3.87–5.11)
RDW: 17.8 % — ABNORMAL HIGH (ref 11.5–15.5)
WBC: 5.9 10*3/uL (ref 4.0–10.5)

## 2015-09-12 LAB — RENAL FUNCTION PANEL
ALBUMIN: 3.5 g/dL (ref 3.5–5.0)
ANION GAP: 15 (ref 5–15)
BUN: 20 mg/dL (ref 6–20)
CALCIUM: 9.7 mg/dL (ref 8.9–10.3)
CO2: 30 mmol/L (ref 22–32)
CREATININE: 7.72 mg/dL — AB (ref 0.44–1.00)
Chloride: 98 mmol/L — ABNORMAL LOW (ref 101–111)
GFR, EST AFRICAN AMERICAN: 7 mL/min — AB (ref >60)
GFR, EST NON AFRICAN AMERICAN: 6 mL/min — AB (ref >60)
Glucose, Bld: 72 mg/dL (ref 65–99)
PHOSPHORUS: 7.1 mg/dL — AB (ref 2.5–4.6)
Potassium: 4.4 mmol/L (ref 3.5–5.1)
SODIUM: 143 mmol/L (ref 135–145)

## 2015-09-12 LAB — TROPONIN I
TROPONIN I: 0.16 ng/mL — AB (ref <0.031)
TROPONIN I: 0.19 ng/mL — AB (ref <0.031)

## 2015-09-12 LAB — MRSA PCR SCREENING: MRSA by PCR: NEGATIVE

## 2015-09-12 MED ORDER — LOSARTAN POTASSIUM 50 MG PO TABS: 50.0000 mg | ORAL_TABLET | Freq: Every evening | ORAL | Status: DC

## 2015-09-12 MED ORDER — LOSARTAN POTASSIUM 50 MG PO TABS
50.0000 mg | ORAL_TABLET | Freq: Every evening | ORAL | Status: DC
  Administered 2015-09-12: 50 mg via ORAL
  Filled 2015-09-12: qty 1

## 2015-09-12 MED ORDER — LOSARTAN POTASSIUM 50 MG PO TABS: 50.0000 mg | ORAL_TABLET | Freq: Every day | ORAL | Status: DC

## 2015-09-12 MED ORDER — AMLODIPINE BESYLATE 10 MG PO TABS: 10.0000 mg | ORAL_TABLET | Freq: Every evening | ORAL | Status: DC

## 2015-09-12 MED ORDER — LABETALOL HCL 5 MG/ML IV SOLN
10.0000 mg | Freq: Once | INTRAVENOUS | Status: AC
  Administered 2015-09-12: 10 mg via INTRAVENOUS

## 2015-09-12 MED ORDER — TACROLIMUS 1 MG PO CAPS
1.0000 mg | ORAL_CAPSULE | Freq: Two times a day (BID) | ORAL | Status: DC
  Administered 2015-09-12 – 2015-09-13 (×2): 1 mg via ORAL
  Filled 2015-09-12 (×2): qty 1

## 2015-09-12 MED ORDER — CLONIDINE HCL 0.1 MG PO TABS
0.1000 mg | ORAL_TABLET | Freq: Two times a day (BID) | ORAL | Status: DC
  Administered 2015-09-12 – 2015-09-13 (×3): 0.1 mg via ORAL
  Filled 2015-09-12 (×3): qty 1

## 2015-09-12 MED ORDER — AMLODIPINE BESYLATE 10 MG PO TABS
10.0000 mg | ORAL_TABLET | Freq: Every evening | ORAL | Status: DC
  Administered 2015-09-12: 10 mg via ORAL
  Filled 2015-09-12: qty 1

## 2015-09-12 NOTE — Consult Note (Signed)
Renal Service Consult Note Kerry Cook Kerry Cook  SIREN SHAMPINE 09/12/2015 [name] Requesting Physician:  Dr Beryle Beams  Reason for Consult:  HTN crisis, ESRD pt HPI: The patient is a 30 y.o. year-old with hx of ESRD on HD since 2009, had renal Tx from 2012 > 2016, back on HD May 2016. Hx HTN.  Admitted for uncontrolled HTN yesterday. Had HD yesterday.  Goes to all her HD sessions, lowering dry wt this week to 53kg.  No CP/ SOB or leg swelling. She says it is possible she is losing weight.  She cramped yesterday with 2kg removal and 1kg drop in dry wt.     ROS  denies CP  no joint pain   no HA  no blurry vision  no rash  no diarrhea  no nausea/ vomiting  no dysuria  no difficulty voiding  no change in urine color    Past Medical History  Past Medical History  Diagnosis Date  . Hypertension   . 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 Dialysis patient ([place])   . Chronic Kerry disease     previous hx dialysis   Past Surgical History  Past Surgical History  Procedure Laterality Date  . Kerry transplant Bilateral 2012   Family History  Family History  Problem Relation Age of Onset  . Family history unknown: Yes   Social History  reports that she has never smoked. She has never used smokeless tobacco. She reports that she does not drink alcohol or use illicit drugs. Allergies No Known Allergies Home medications Prior to Admission medications   Medication Sig Start Date End Date Taking? Authorizing Provider  amLODipine (NORVASC) 10 MG tablet Take 10 mg by mouth daily.   Yes Historical Provider, MD  calcium acetate (PHOSLO) 667 MG capsule Take 2,001 mg by mouth 3 (three) times daily with meals. 05/29/15  Yes Historical Provider, MD  hydrALAZINE (APRESOLINE) 50 MG tablet Take 100 mg by mouth 2 (two) times daily.  06/01/15  Yes Historical Provider, MD  labetalol (NORMODYNE) 200 MG tablet Take 1 tablet (200 mg total) by  mouth 2 (two) times daily. 06/08/15  Yes Debbe Odea, MD  losartan (COZAAR) 100 MG tablet Take 1 tablet (100 mg total) by mouth daily. 02/18/15  Yes [name], MD  multivitamin (RENA-VIT) TABS tablet Take 1 tablet by mouth at bedtime. 06/09/15  Yes Debbe Odea, MD  predniSONE (DELTASONE) 5 MG tablet Take 5 mg by mouth daily with breakfast.   Yes Historical Provider, MD  tacrolimus (PROGRAF) 1 MG capsule Take 4 mg by mouth 2 (two) times daily.    Yes Historical Provider, MD  acetaminophen (TYLENOL) 500 MG tablet Take 1 tablet (500 mg total) by mouth every 6 (six) hours as needed. 06/30/15   [name], FNP   Liver Function Tests  Recent Labs Lab 09/12/15 0359  ALBUMIN 3.5   No results for input(s): LIPASE, AMYLASE in the last 168 hours. CBC  Recent Labs Lab 09/11/15 1607 09/12/15 0359  WBC 8.9 5.9  NEUTROABS 7.6  --   HGB 9.7* 9.0*  HCT 30.3* 28.7*  MCV 96.2 97.3  PLT 170 99991111   Basic Metabolic Panel  Recent Labs Lab 09/11/15 1607 09/12/15 0359  NA 141 143  K 4.2 4.4  CL 98* 98*  CO2 29 30  GLUCOSE 98 72  BUN 12 20  CREATININE 5.95* 7.72*  CALCIUM 9.8 9.7  PHOS  --  7.1*    Filed Vitals:   09/12/15 0431 09/12/15 0530 09/12/15 0653 09/12/15 0851  BP: 189/119 188/121 183/125 195/134  Pulse: 95 91 88 98  Temp: 98 F (36.7 C)   97.9 F (36.6 C)  TempSrc: Oral   Oral  Resp: 18   17  Height:      Weight:      SpO2: 96%   98%   Exam Alert no distress, calm and plesaant No rash, cyanosis or gangrene Sclera anicteric, throat clear  No jvd or bruits Chest clear bilat RRR no MRG Abd soft ntnd no mass or ascites +bs GU defer MS no joint effusions or deformity Ext no edema / wounds/ ulcer Neuro is alert, Ox 3 , nf L forearm AVF +bruit  Clon 0.1 bid, hydralazine 100 tid,  Labetalol 200 bid, norvasc 10/d    Dialysis: MWF [name]  3h 58min     Assessment: 1 HTN crisis - will resume losartan (dc'd 1 yr ago), she is also on clonidine which we have  ordered. Change norvasc to qhs and give losartan in the evening as well.  She has problems with higher BP's usually in the morning.  Change hydralazine to 50 mg bid (she is not taking the 100 tid as listed on her home med list). Give all her evening med later in the evening around 10 pm.   2 ESRD - HD in am tomorrow, dry wt lowered 53kg this week, will lower to 52 kg if poss tomorrow.  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 Will follow.   Kelly Splinter MD [employer] pager [phone]    cell [phone] 09/12/2015, 10:50 AM

## 2015-09-12 NOTE — Progress Notes (Signed)
Late entry:  New Admission Note:   Arrival: From ED via stretcher Mental Orientation: A&Ox4 Telemetry: Placed on box 6e03 Assessment:  See doc flowsheet Skin: Intact IV: Right AC IV Pain: C/o headache Safety Measures:  Call bell placed within reach; patient instructed on use of call bell and verbalized understanding. Bed in lowest position.   [address] [place] Orientation: Patient oriented to staff, room, and unit. Family: Boyfriend(?) at bedside  Orders have been reviewed and implemented. Patient stated she is too tired and requested admission history to be done tomorrow.  Will continue to monitor.  Arlyss Queen, RN, BSN

## 2015-09-12 NOTE — Progress Notes (Signed)
BP this morning 189/119.  Patient asymptomatic. Given 10 mg IV labetolol prn.  Will recheck BP.  Notified MD on-call regarding situation.  Will continue to monitor.

## 2015-09-12 NOTE — Progress Notes (Signed)
Subjective: Patient was seen and examined at bedside. Reports feeling well today. Denies having any headache or vision changes. Denies CP or dyspnea. No other complaints.   Objective: Vital signs in last 24 hours: Filed Vitals:   09/12/15 0530 09/12/15 0653 09/12/15 0851 09/12/15 1711  BP: 188/121 183/125 195/134 150/102  Pulse: 91 88 98 91  Temp:   97.9 F (36.6 C) 98.3 F (36.8 C)  TempSrc:   Oral Oral  Resp:   17 18  Height:      Weight:      SpO2:   98% 100%   Weight change:   Intake/Output Summary (Last 24 hours) at 09/12/15 1817 Last data filed at 09/12/15 1400  Gross per 24 hour  Intake   1083 ml  Output      0 ml  Net   1083 ml   Physical Exam  Constitutional: She is oriented to person, place, and time. She appears well-developed and well-nourished. No distress.  HENT:  Head: Normocephalic and atraumatic.  Mouth/Throat: Oropharynx is clear and moist.  Eyes: EOM are normal. Pupils are equal, round, and reactive to light.  Neck: Neck supple. No tracheal deviation present.  Cardiovascular: Regular rhythm and intact distal pulses. Exam reveals no gallop and no friction rub.  No murmur heard. Pulmonary/Chest: Effort normal and breath sounds normal. No respiratory distress. She has no wheezes. She has no rales.  Abdominal: Soft. Bowel sounds are normal. She exhibits no distension. There is no tenderness.  Musculoskeletal: She exhibits no edema or tenderness.  Neurological: She is alert and oriented to person, place, and time. No cranial nerve deficit.  Strength 5/5 and sensation grossly intact in bilateral upper and lower extremities.  Skin: Skin is warm and dry. No rash noted. She is not diaphoretic. No erythema.   Lab Results: Basic Metabolic Panel:  Recent Labs Lab 09/11/15 1607 09/12/15 0359  NA 141 143  K 4.2 4.4  CL 98* 98*  CO2 29 30  GLUCOSE 98 72  BUN 12 20  CREATININE 5.95* 7.72*  CALCIUM 9.8 9.7  PHOS  --  7.1*   Liver Function  Tests:  Recent Labs Lab 09/12/15 0359  ALBUMIN 3.5   CBC:  Recent Labs Lab 09/11/15 1607 09/12/15 0359  WBC 8.9 5.9  NEUTROABS 7.6  --   HGB 9.7* 9.0*  HCT 30.3* 28.7*  MCV 96.2 97.3  PLT 170 191   Cardiac Enzymes:  Recent Labs Lab 09/11/15 2226 09/12/15 0359 09/12/15 0855  TROPONINI 0.16* 0.16* 0.19*   Urine Drug Screen: Drugs of Abuse     Component Value Date/Time   LABOPIA NEG 06/02/2014 1103   COCAINSCRNUR NEG 06/02/2014 1103   LABBENZ NEG 06/02/2014 1103   LABBENZ NEGATIVE 08/26/2007 0820   AMPHETMU NEG 06/02/2014 1103   AMPHETMU NEGATIVE 08/26/2007 0820   THCU NEG 06/02/2014 1103   LABBARB NEG 06/02/2014 1103     Micro Results: Recent Results (from the past 240 hour(s))  MRSA PCR Screening     Status: None   Collection Time: 09/12/15  4:34 AM  Result Value Ref Range Status   MRSA by PCR NEGATIVE NEGATIVE Final    Comment:        The GeneXpert MRSA Assay (FDA approved for NASAL specimens only), is one component of a comprehensive MRSA colonization surveillance program. It is not intended to diagnose MRSA infection nor to guide or monitor treatment for MRSA infections.    Studies/Results: Ct Head Wo Contrast  09/11/2015  CLINICAL DATA:  Persistent headache. EXAM: CT HEAD WITHOUT CONTRAST TECHNIQUE: Contiguous axial images were obtained from the base of the skull through the vertex without intravenous contrast. COMPARISON:  06/07/2015 FINDINGS: No mass lesion. No midline shift. No acute hemorrhage or hematoma. No extra-axial fluid collections. No evidence of acute infarction. Brain parenchyma is normal. Bones are normal. IMPRESSION: Normal exam. Electronically Signed   By: Lorriane Shire M.D.   On: 09/11/2015 16:44   Dg Chest Port 1 View  09/11/2015  CLINICAL DATA:  Shortness of breath, hypertension for 2 days, history of kidney transplant, dialysis EXAM: PORTABLE CHEST 1 VIEW COMPARISON:  02/16/2015 FINDINGS: Cardiomegaly is noted. Central mild  vascular congestion. Mild interstitial prominence bilateral suspicious for mild interstitial edema. No segmental infiltrate or pleural effusion IMPRESSION: Cardiomegaly. Mild interstitial prominence bilaterally suspicious for mild interstitial edema. No segmental infiltrate or pleural effusion. Electronically Signed   By: Lahoma Crocker M.D.   On: 09/11/2015 16:33   Medications: I have reviewed the patient's current medications. Scheduled Meds: . amLODipine  10 mg Oral QPM  . calcium acetate  2,001 mg Oral TID WC  . cloNIDine  0.1 mg Oral BID  . heparin  5,000 Units Subcutaneous 3 times per day  . hydrALAZINE  50 mg Oral BID  . labetalol  200 mg Oral BID  . losartan  50 mg Oral QPM  . multivitamin  1 tablet Oral QHS  . predniSONE  5 mg Oral Q breakfast  . sodium chloride flush  3 mL Intravenous Q12H  . tacrolimus  1 mg Oral BID   Continuous Infusions:  PRN Meds:.acetaminophen **OR** acetaminophen, labetalol, senna-docusate Assessment/Plan: Active Problems:   Hypertensive urgency   ESRD (end stage renal disease) on dialysis [facility])  Hypertensive urgency Patient has a history of uncontrolled hypertension and has been evaluated multiples times in the past. BP 227/156 in the ED. Patient presented with acute onset severe headache, blurry vision, nausea/ vomiting, photophobia, and phonophobia. Neuro exam grossly normal. CT head did not show any acute intracranial abnormality. Symptoms likely in the setting of uncontrolled hypertension 2/2 medication non-compliance. Trop 0.14 > 0.16 > 0.16 >0.19. EKG showing T waves inverted leads 1, aVL, V6, prolonged QT interval, questionable ST elevation lead V2. Also showing left ventricular hypertrophy. Patient is not complaining of any chest pain or dyspnea. She is now off continuous infusion antihypertensives. Blood pressure continued to be high this morning with systolic in [id number] and diastolic in [id number], however, patient was symptomatic. Nephrology was consulted.   -Appreciate nephrology recs  -Labetalol 200 mg BID -Hydralazine 50 mg BID -Amlodipine 10 mg qhs -Clonidine 0.1 mg BID -Losartan 50 gm qhs  -Give Labetalol 10 mg q4 prn for systolic BP [id number], diastolic [id number]   Dyspnea (resolved) O2 saturation 88% on RA upon admission and patient was started on supplemental O2. CXR showing mild interstitial edema. No segmental infiltrate or pleural effusion. Patient is not complaining of dyspnea today. Satting 96-100% on room air.   ESRD and on HD (MWF) ESRD 2/2 hx of uncontrolled HTN. Patient had bilateral renal transplants which failed. She was started on dialysis last year. It is not clear why she is still on chronic immunosuppressive therapy with prograf and low-dose prednisone.  -Nephro following  -Due for dialysis tomorrow  -Tacrolimus 3 mg BID -Prednisone 5 mg daily  -Rena-vit daily -Phoslo TID -Monitor I/Os -F/u am renal function panel   Anemia of chronic disease Hgb 9.7, MCV 96.2 on admission. Ferritin high at 2161 in  12/2014. Likely in the setting of ESRD.  DVT ppx: Subcutaneous heparin   Diet: renal   Dispo: Disposition is deferred at this time, awaiting improvement of current medical problems.  Anticipated discharge in approximately 1-2 day(s).   The patient does have a current PCP (No Pcp Per Patient) and does need an [facility] hospital follow-up appointment after discharge.  The patient does not have transportation limitations that hinder transportation to clinic appointments.  .Services Needed at time of discharge: Y = Yes, Blank = No PT:   OT:   RN:   Equipment:   Other:       [name], MD 09/12/2015, 6:17 PM

## 2015-09-12 NOTE — Progress Notes (Signed)
BP 183/125 after a combined total of 20 mg IV labetalol.  Asymptomatic. Notified MD on-call.

## 2015-09-12 NOTE — Progress Notes (Signed)
BP 181/121 after administration of IV labetolol 10 mg prn.  Notified MD on-call. Will continue to monitor.

## 2015-09-13 ENCOUNTER — Other Ambulatory Visit: Payer: Self-pay | Admitting: Family Medicine

## 2015-09-13 ENCOUNTER — Other Ambulatory Visit: Payer: Self-pay

## 2015-09-13 DIAGNOSIS — T8612 Kidney transplant failure: Secondary | ICD-10-CM | POA: Diagnosis not present

## 2015-09-13 DIAGNOSIS — Z992 Dependence on renal dialysis: Secondary | ICD-10-CM | POA: Diagnosis not present

## 2015-09-13 DIAGNOSIS — I160 Hypertensive urgency: Secondary | ICD-10-CM | POA: Diagnosis not present

## 2015-09-13 DIAGNOSIS — N186 End stage renal disease: Secondary | ICD-10-CM | POA: Diagnosis not present

## 2015-09-13 DIAGNOSIS — H538 Other visual disturbances: Secondary | ICD-10-CM | POA: Diagnosis not present

## 2015-09-13 DIAGNOSIS — I169 Hypertensive crisis, unspecified: Secondary | ICD-10-CM | POA: Diagnosis not present

## 2015-09-13 DIAGNOSIS — I120 Hypertensive chronic kidney disease with stage 5 chronic kidney disease or end stage renal disease: Secondary | ICD-10-CM | POA: Diagnosis not present

## 2015-09-13 LAB — RENAL FUNCTION PANEL
Albumin: 3.2 g/dL — ABNORMAL LOW (ref 3.5–5.0)
Anion gap: 16 — ABNORMAL HIGH (ref 5–15)
BUN: 39 mg/dL — ABNORMAL HIGH (ref 6–20)
CALCIUM: 9.7 mg/dL (ref 8.9–10.3)
CO2: 26 mmol/L (ref 22–32)
CREATININE: 10.57 mg/dL — AB (ref 0.44–1.00)
Chloride: 96 mmol/L — ABNORMAL LOW (ref 101–111)
GFR, EST AFRICAN AMERICAN: 5 mL/min — AB (ref >60)
GFR, EST NON AFRICAN AMERICAN: 4 mL/min — AB (ref >60)
Glucose, Bld: 72 mg/dL (ref 65–99)
PHOSPHORUS: 7.1 mg/dL — AB (ref 2.5–4.6)
Potassium: 4.2 mmol/L (ref 3.5–5.1)
SODIUM: 138 mmol/L (ref 135–145)

## 2015-09-13 LAB — HCV COMMENT:

## 2015-09-13 LAB — HEPATITIS C ANTIBODY (REFLEX)

## 2015-09-13 LAB — HIV ANTIBODY (ROUTINE TESTING W REFLEX): HIV SCREEN 4TH GENERATION: NONREACTIVE

## 2015-09-13 LAB — HEPATITIS B SURFACE ANTIGEN: HEP B S AG: NEGATIVE

## 2015-09-13 MED ORDER — SODIUM CHLORIDE 0.9 % IV SOLN: 100.0000 mL | INTRAVENOUS | Status: DC | PRN

## 2015-09-13 MED ORDER — HEPARIN SODIUM (PORCINE) 1000 UNIT/ML DIALYSIS: 2000.0000 [IU] | INTRAMUSCULAR | Status: DC | PRN

## 2015-09-13 MED ORDER — ACETAMINOPHEN 325 MG PO TABS
ORAL_TABLET | ORAL | Status: AC
  Filled 2015-09-13: qty 2

## 2015-09-13 MED ORDER — AMLODIPINE BESYLATE 10 MG PO TABS
10.0000 mg | ORAL_TABLET | Freq: Every evening | ORAL | Status: DC
Start: 2015-09-13 — End: 2020-12-14

## 2015-09-13 MED ORDER — PENTAFLUOROPROP-TETRAFLUOROETH EX AERO: 1.0000 "application " | INHALATION_SPRAY | CUTANEOUS | Status: DC | PRN

## 2015-09-13 MED ORDER — LIDOCAINE-PRILOCAINE 2.5-2.5 % EX CREA: 1.0000 "application " | TOPICAL_CREAM | CUTANEOUS | Status: DC | PRN

## 2015-09-13 MED ORDER — HYDRALAZINE HCL 50 MG PO TABS
50.0000 mg | ORAL_TABLET | Freq: Two times a day (BID) | ORAL | Status: DC
Start: 2015-09-13 — End: 2016-03-31

## 2015-09-13 MED ORDER — LIDOCAINE HCL (PF) 1 % IJ SOLN: 5.0000 mL | INTRAMUSCULAR | Status: DC | PRN

## 2015-09-13 MED ORDER — LOSARTAN POTASSIUM 50 MG PO TABS: 50.0000 mg | ORAL_TABLET | Freq: Every evening | ORAL | Status: DC

## 2015-09-13 MED ORDER — HEPARIN SODIUM (PORCINE) 1000 UNIT/ML DIALYSIS: 1000.0000 [IU] | INTRAMUSCULAR | Status: DC | PRN

## 2015-09-13 MED ORDER — ALTEPLASE 2 MG IJ SOLR: 2.0000 mg | Freq: Once | INTRAMUSCULAR | Status: DC | PRN

## 2015-09-13 NOTE — Discharge Summary (Signed)
Name: Kerry Cook MRN: [id number] DOB: 1986-01-01 30 y.o. PCP: No Pcp Per Patient  Date of Admission: 09/11/2015  1:19 PM Date of Discharge: 09/15/2015 Attending Physician: No att. providers found  Discharge Diagnosis:  Active Problems:   Hypertensive urgency   ESRD (end stage renal disease) on dialysis [facility])  Discharge Medications:   Medication List    STOP taking these medications        acetaminophen 500 MG tablet  Commonly known as:  TYLENOL      TAKE these medications        amLODipine 10 MG tablet  Commonly known as:  NORVASC  Take 1 tablet (10 mg total) by mouth every evening.     calcium acetate 667 MG capsule  Commonly known as:  PHOSLO  Take 2,001 mg by mouth 3 (three) times daily with meals.     cloNIDine 0.1 MG tablet  Commonly known as:  CATAPRES  Take 0.1 mg by mouth 2 (two) times daily.     hydrALAZINE 50 MG tablet  Commonly known as:  APRESOLINE  Take 1 tablet (50 mg total) by mouth 2 (two) times daily.     labetalol 200 MG tablet  Commonly known as:  NORMODYNE  Take 1 tablet (200 mg total) by mouth 2 (two) times daily.     losartan 50 MG tablet  Commonly known as:  COZAAR  Take 1 tablet (50 mg total) by mouth every evening.     multivitamin Tabs tablet  Take 1 tablet by mouth at bedtime.     predniSONE 5 MG tablet  Commonly known as:  DELTASONE  Take 5 mg by mouth daily with breakfast.     tacrolimus 1 MG capsule  Commonly known as:  PROGRAF  Take 1 mg by mouth 2 (two) times daily.        Disposition and follow-up:   Kerry Cook was discharged from [facility] in Good condition.  At the hospital follow up visit please address:  1.  Hypertensive urgency - assess medication compliance. Consider tapering off Clonidine first if blood pressure is improved at follow-up visit.   2.  Labs / imaging needed at time of follow-up:   3.  Pending labs/ test needing follow-up:   Follow-up  Appointments: Follow-up Information    Follow up with [facility] on 10/17/2015.   Specialty:  Internal Medicine   Why:  Follow up appointment at 10:30 am.    Contact information:   [place] [phone]      Schedule an appointment as soon as possible for a visit with Windy Kalata, MD.   Specialty:  Nephrology   Why:  Follow up   Contact information:   [facility] [phone]       Discharge Instructions:  Take the following medications for your blood pressure:  Labetalol 200 mg twice daily (every 12 hours).  Hydralazine 50 mg twice daily (every 12 hours)  Clonidine 0.1 mg twice daily (every 12 hours)   Amlodipine 10 mg every evening   Losartan 50 gm every evening    Please follow-up with your primary care physician and nephrologist.   Consultations: Treatment Team:  Roney Jaffe, MD  Procedures Performed:  Ct Head Wo Contrast  09/11/2015  CLINICAL DATA:  Persistent headache. EXAM: CT HEAD WITHOUT CONTRAST TECHNIQUE: Contiguous axial images were obtained from the base of the skull through the vertex without  intravenous contrast. COMPARISON:  06/07/2015 FINDINGS: No mass lesion. No midline shift. No acute hemorrhage or hematoma. No extra-axial fluid collections. No evidence of acute infarction. Brain parenchyma is normal. Bones are normal. IMPRESSION: Normal exam. Electronically Signed   By: Lorriane Shire M.D.   On: 09/11/2015 16:44   Dg Chest Port 1 View  09/11/2015  CLINICAL DATA:  Shortness of breath, hypertension for 2 days, history of kidney transplant, dialysis EXAM: PORTABLE CHEST 1 VIEW COMPARISON:  02/16/2015 FINDINGS: Cardiomegaly is noted. Central mild vascular congestion. Mild interstitial prominence bilateral suspicious for mild interstitial edema. No segmental infiltrate or pleural effusion IMPRESSION: Cardiomegaly. Mild interstitial prominence bilaterally suspicious for mild  interstitial edema. No segmental infiltrate or pleural effusion. Electronically Signed   By: Lahoma Crocker M.D.   On: 09/11/2015 16:33   Admission HPI: Patient is a 30 yo F with a PMHx of HTN, and CKD s/p renal transplant 2/2 HTN presenting with acute onset headache which started after dialysis today. Patient describes the headache as right frontal in location, constant, 10/10 intensity, and throbbing in character. States the headache is associated with blurry vision, photophobia, phonophobia, dizziness, nausea, and vomiting. She vomited once today. States her Ara Kussmaul has been blurry for the past 1 week even prior to the headache starting. Denies any associated neck stiffness, nausea, vomiting, focal weakness or numbness, gait problems, or loss of consciousness. States Tylenol helped. Denies any prior history of headaches. She also mentions being short of breath last night and states her dyspnea resolved after dialysis. States she has had dyspnea in the past and it always resolved after dialysis. Denies having any SOB or CP at present. Denies having any fevers, chills, cough, sore throat, or viral URI symptoms. Denies any recent travel or OCP use.  As per ED provider documentation, patient has been evaluated multiples times in the past for headaches and uncontrolled HTN. She reports taking Norvasc, Hydralazine, and Labetolol at home and does admit to missing doses of her medications on a regular basis. States she eats "all kinds of stuff." CT head done did not show any acute intracranial abnormality. BP 227/156 in the ED. Satting 88% on RA initially and was placed on supplemental O2. She was started on labetolol, toradol, compazine, and benadryl in the ED.  Hospital Course by problem list: Active Problems:   Hypertensive urgency   ESRD (end stage renal disease) on dialysis [facility])   Hypertensive urgency Patient has a history of uncontrolled hypertension and has been evaluated multiples times in the past. She  presented with acute onset severe headache, blurry vision, nausea/ vomiting, photophobia, and phonophobia. BP 227/156 in the ED on admission. Neuro exam did not show any focal deficits. CT head did not show any acute intracranial abnormality. She was initially treated with continuous infusion antihypertensives and then transitioned to oral medications. Her symptoms resolved and neuro exam remained normal. However, blood pressure continued to be high with systolic in [id number] and diastolic [id number]. Nephrology was consulted and changes were made to her home medication regimen. Patient was also started on Losartan in addition to her home medications. Blood pressure improved significantly with the new medication regimen. She is to follow-up with her primary care physician and nephrologist as outpatient.     ESRD and on HD (MWF) ESRD secondary to history of uncontrolled HTN. Patient had bilateral renal transplants which failed. She was started on dialysis last year. It is not clear why she is still on chronic immunosuppressive therapy with prograf and low-dose  prednisone. She continued to receive dialysis during this hospitalization.   Discharge Vitals:   BP 154/76 mmHg  Pulse 81  Temp(Src) 97.8 F (36.6 C) (Oral)  Resp 19  Ht 5' (1.524 m)  Wt 51.8 kg (114 lb 3.2 oz)  BMI 22.30 kg/m2  SpO2 100%  LMP  (LMP Unknown)  Discharge Labs:  No results found for this or any previous visit (from the past 24 hour(s)).  Signed: [name], MD 09/15/2015, 3:07 PM    Services Ordered on Discharge:  Equipment Ordered on Discharge:

## 2015-09-13 NOTE — Care Management Note (Signed)
Case Management Note  Patient Details  Name: ZOEYLYNN CELESTINE MRN: [id number] Date of Birth: 1986-05-28  Subjective/Objective:             CM following for progression and d/c planning.       Action/Plan: 09/13/2015 requested pt copay info from pt insurance provider, per MD request for Clonidine patches. Per insurance provider this pt copay would be $80-$150/month. MD notified. As this pt has insurance we are unable to assist with medications. Oral medication is much less expensive.   Expected Discharge Date:    09/13/2015              Expected Discharge Plan:  Home/Self Care  In-House Referral:  NA  Discharge planning Services  CM Consult, Medication Assistance  Post Acute Care Choice:    Choice offered to:     DME Arranged:    DME Agency:     HH Arranged:    HH Agency:     Status of Service:  In process, will continue to follow  Medicare Important Message Given:    Date Medicare IM Given:    Medicare IM give by:    Date Additional Medicare IM Given:    Additional Medicare Important Message give by:     If discussed at [place] of Stay Meetings, dates discussed:    Additional Comments:  Adron Bene, RN 09/13/2015, 12:36 PM

## 2015-09-13 NOTE — Progress Notes (Signed)
  [facility] Progress Note   Subjective: feeling better  Filed Vitals:   09/13/15 0830 09/13/15 0835 09/13/15 0900 09/13/15 0930  BP: 157/96 139/82 148/85 153/85  Pulse: 78 80  89  Temp:  97.8 F (36.6 C)    TempSrc:  Oral    Resp:  18 16 17   Height:      Weight: 54.2 kg (119 lb 7.8 oz)     SpO2:  100%      Inpatient medications: . amLODipine  10 mg Oral QPM  . calcium acetate  2,001 mg Oral TID WC  . cloNIDine  0.1 mg Oral BID  . heparin  5,000 Units Subcutaneous 3 times per day  . hydrALAZINE  50 mg Oral BID  . labetalol  200 mg Oral BID  . losartan  50 mg Oral QPM  . multivitamin  1 tablet Oral QHS  . predniSONE  5 mg Oral Q breakfast  . sodium chloride flush  3 mL Intravenous Q12H  . tacrolimus  1 mg Oral BID     sodium chloride, sodium chloride, acetaminophen **OR** acetaminophen, alteplase, heparin, [START ON 09/14/2015] heparin, labetalol, lidocaine (PF), lidocaine-prilocaine, pentafluoroprop-tetrafluoroeth, senna-docusate  Exam: Alert no distress, calm and plesaant No jvd or bruits Chest clear bilat RRR no MRG Abd soft ntnd no mass or ascites +bs GU defer MS no joint effusions or deformity Ext no edema / wounds/ ulcer Neuro is alert, Ox 3 , nf L forearm AVF +bruit  Clon 0.1 bid, hydralazine 100 tid, Labetalol 200 bid, norvasc 10 qhs, added losartan here 50 qhs  Dialysis: MWF [name] 3h 62min   Assessment: 1 HTN crisis - better. Have resumed losartan, in addition to 4 other antiHTN'sives.  Asked her to take her bid meds every 12 hours instead of qam and qpm, so she can have more even coverage throughout the day.  Also will take all her qd meds at nighttime since the worst bp's are in the morning for her.  If BP's improve, would dc clonidine first w a taper that we explained to patient. 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) 4 dispo - stable from renal standpoint, dc per primary  Plan - HD today   Kelly Splinter MD [facility] pager [phone]    cell [phone] 09/13/2015, 10:07 AM    Recent Labs Lab 09/11/15 1607 09/12/15 0359 09/13/15 0540  NA 141 143 138  K 4.2 4.4 4.2  CL 98* 98* 96*  CO2 29 30 26   GLUCOSE 98 72 72  BUN 12 20 39*  CREATININE 5.95* 7.72* 10.57*  CALCIUM 9.8 9.7 9.7  PHOS  --  7.1* 7.1*    Recent Labs Lab 09/12/15 0359 09/13/15 0540  ALBUMIN 3.5 3.2*    Recent Labs Lab 09/11/15 1607 09/12/15 0359  WBC 8.9 5.9  NEUTROABS 7.6  --   HGB 9.7* 9.0*  HCT 30.3* 28.7*  MCV 96.2 97.3  PLT 170 191

## 2015-09-13 NOTE — Progress Notes (Signed)
Subjective: Patient was seen and examined at the dialysis unit. No acute overnight events. Reports feeling well today. Denies having any headaches or vision changes. Denies CP or dyspnea. No other complaints.   Objective: Vital signs in last 24 hours: Filed Vitals:   09/13/15 0830 09/13/15 0835 09/13/15 0900 09/13/15 0930  BP: 157/96 139/82 148/85 153/85  Pulse: 78 80  89  Temp:  97.8 F (36.6 C)    TempSrc:  Oral    Resp:  18 16 17   Height:      Weight: 54.2 kg (119 lb 7.8 oz)     SpO2:  100%     Weight change:   Intake/Output Summary (Last 24 hours) at 09/13/15 1045 Last data filed at 09/13/15 0600  Gross per 24 hour  Intake    723 ml  Output      0 ml  Net    723 ml   Physical Exam  Constitutional: She is oriented to person, place, and time. She appears well-developed and well-nourished. No distress.  Eyes: EOM are normal. Pupils are equal, round, and reactive to light.  Cardiovascular: Regular rhythm and intact distal pulses. Exam reveals no gallop and no friction rub.  No murmur heard. Pulmonary/Chest: Effort normal and breath sounds normal. No respiratory distress. She has no wheezes. She has no rales.  Abdominal: Soft. Bowel sounds are normal. She exhibits no distension. There is no tenderness.  Musculoskeletal: She exhibits no edema or tenderness.  Neurological: She is alert and oriented to person, place, and time. No cranial nerve deficit.  Strength 5/5 and sensation grossly intact in bilateral upper and lower extremities.  Skin: Skin is warm and dry. No rash noted. She is not diaphoretic. No erythema.   Lab Results: Basic Metabolic Panel:  Recent Labs Lab 09/12/15 0359 09/13/15 0540  NA 143 138  K 4.4 4.2  CL 98* 96*  CO2 30 26  GLUCOSE 72 72  BUN 20 39*  CREATININE 7.72* 10.57*  CALCIUM 9.7 9.7  PHOS 7.1* 7.1*   Liver Function Tests:  Recent Labs Lab 09/12/15 0359 09/13/15 0540  ALBUMIN 3.5 3.2*   CBC:  Recent Labs Lab 09/11/15 1607  09/12/15 0359  WBC 8.9 5.9  NEUTROABS 7.6  --   HGB 9.7* 9.0*  HCT 30.3* 28.7*  MCV 96.2 97.3  PLT 170 191   Cardiac Enzymes:  Recent Labs Lab 09/11/15 2226 09/12/15 0359 09/12/15 0855  TROPONINI 0.16* 0.16* 0.19*   Urine Drug Screen: Drugs of Abuse     Component Value Date/Time   LABOPIA NEG 06/02/2014 1103   COCAINSCRNUR NEG 06/02/2014 1103   LABBENZ NEG 06/02/2014 1103   LABBENZ NEGATIVE 08/26/2007 0820   AMPHETMU NEG 06/02/2014 1103   AMPHETMU NEGATIVE 08/26/2007 0820   THCU NEG 06/02/2014 1103   LABBARB NEG 06/02/2014 1103     Micro Results: Recent Results (from the past 240 hour(s))  MRSA PCR Screening     Status: None   Collection Time: 09/12/15  4:34 AM  Result Value Ref Range Status   MRSA by PCR NEGATIVE NEGATIVE Final    Comment:        The GeneXpert MRSA Assay (FDA approved for NASAL specimens only), is one component of a comprehensive MRSA colonization surveillance program. It is not intended to diagnose MRSA infection nor to guide or monitor treatment for MRSA infections.    Studies/Results: Ct Head Wo Contrast  09/11/2015  CLINICAL DATA:  Persistent headache. EXAM: CT HEAD WITHOUT CONTRAST  TECHNIQUE: Contiguous axial images were obtained from the base of the skull through the vertex without intravenous contrast. COMPARISON:  06/07/2015 FINDINGS: No mass lesion. No midline shift. No acute hemorrhage or hematoma. No extra-axial fluid collections. No evidence of acute infarction. Brain parenchyma is normal. Bones are normal. IMPRESSION: Normal exam. Electronically Signed   By: Lorriane Shire M.D.   On: 09/11/2015 16:44   Dg Chest Port 1 View  09/11/2015  CLINICAL DATA:  Shortness of breath, hypertension for 2 days, history of kidney transplant, dialysis EXAM: PORTABLE CHEST 1 VIEW COMPARISON:  02/16/2015 FINDINGS: Cardiomegaly is noted. Central mild vascular congestion. Mild interstitial prominence bilateral suspicious for mild interstitial edema. No  segmental infiltrate or pleural effusion IMPRESSION: Cardiomegaly. Mild interstitial prominence bilaterally suspicious for mild interstitial edema. No segmental infiltrate or pleural effusion. Electronically Signed   By: Lahoma Crocker M.D.   On: 09/11/2015 16:33   Medications: I have reviewed the patient's current medications. Scheduled Meds: . amLODipine  10 mg Oral QPM  . calcium acetate  2,001 mg Oral TID WC  . cloNIDine  0.1 mg Oral BID  . heparin  5,000 Units Subcutaneous 3 times per day  . hydrALAZINE  50 mg Oral BID  . labetalol  200 mg Oral BID  . losartan  50 mg Oral QPM  . multivitamin  1 tablet Oral QHS  . predniSONE  5 mg Oral Q breakfast  . sodium chloride flush  3 mL Intravenous Q12H  . tacrolimus  1 mg Oral BID   Continuous Infusions:  PRN Meds:.sodium chloride, sodium chloride, acetaminophen **OR** acetaminophen, alteplase, heparin, [START ON 09/14/2015] heparin, labetalol, lidocaine (PF), lidocaine-prilocaine, pentafluoroprop-tetrafluoroeth, senna-docusate Assessment/Plan: Active Problems:   Hypertensive urgency   ESRD (end stage renal disease) on dialysis [facility])  Hypertensive urgency Patient has a history of uncontrolled hypertension and has been evaluated multiples times in the past. BP 227/156 in the ED. Patient presented with acute onset severe headache, blurry vision, nausea/ vomiting, photophobia, and phonophobia. Neuro exam grossly normal. CT head did not show any acute intracranial abnormality. Symptoms likely in the setting of uncontrolled hypertension 2/2 medication non-compliance. Trop 0.14 > 0.16 > 0.16 >0.19. EKG showing T waves inverted leads 1, aVL, V6, prolonged QT interval, questionable ST elevation lead V2. Also showing left ventricular hypertrophy. Patient is not complaining of any chest pain or dyspnea. She is now off continuous infusion antihypertensives. Blood pressure improved with oral meds - 145/90 this morning. If BP stays stable, patient will likely be  discharged today.  -Appreciate nephrology recs  -Labetalol 200 mg BID -Hydralazine 50 mg BID -Amlodipine 10 mg qhs -Clonidine 0.1 mg BID. Consider switching to weekly transdermal patch upon discharge.  -Losartan 50 gm qhs  -Give Labetalol 10 mg q4 prn for systolic BP [id number], diastolic [id number]   Dyspnea (resolved) O2 saturation 88% on RA upon admission and patient was started on supplemental O2. CXR showing mild interstitial edema. No segmental infiltrate or pleural effusion. Patient is not complaining of dyspnea today. Lungs clear to auscultation. Satting 98-100% on room air.   ESRD and on HD (MWF) ESRD 2/2 hx of uncontrolled HTN. Patient had bilateral renal transplants which failed. She was started on dialysis last year. It is not clear why she is still on chronic immunosuppressive therapy with prograf and low-dose prednisone. Patient went for dialysis this morning.  -Nephro following  -Tacrolimus 3 mg BID -Prednisone 5 mg daily  -Rena-vit daily -Phoslo TID -Monitor I/Os  Anemia of chronic disease Hgb 9.7,  MCV 96.2 on admission. Ferritin high at 2161 in 12/2014. Likely in the setting of ESRD.  DVT ppx: Subcutaneous heparin   Diet: renal   Dispo: Disposition is deferred at this time, awaiting improvement of current medical problems.  Anticipated discharge in approximately 1-2 day(s).   The patient does have a current PCP (No Pcp Per Patient) and does need an [facility] hospital follow-up appointment after discharge.  The patient does not have transportation limitations that hinder transportation to clinic appointments.  .Services Needed at time of discharge: Y = Yes, Blank = No PT:   OT:   RN:   Equipment:   Other:       [name], MD 09/13/2015, 10:45 AM

## 2015-09-13 NOTE — Discharge Instructions (Signed)
Take the following medications for your blood pressure:  Labetalol 200 mg twice daily (every 12 hours).  Hydralazine 50 mg twice daily (every 12 hours)  Clonidine 0.1 mg twice daily (every 12 hours)   Amlodipine 10 mg every evening   Losartan 50 gm every evening    Please follow-up with your primary care physician and nephrologist.

## 2015-09-15 DIAGNOSIS — D509 Iron deficiency anemia, unspecified: Secondary | ICD-10-CM | POA: Diagnosis not present

## 2015-09-15 DIAGNOSIS — N186 End stage renal disease: Secondary | ICD-10-CM | POA: Diagnosis not present

## 2015-09-15 DIAGNOSIS — N2581 Secondary hyperparathyroidism of renal origin: Secondary | ICD-10-CM | POA: Diagnosis not present

## 2015-09-18 DIAGNOSIS — N186 End stage renal disease: Secondary | ICD-10-CM | POA: Diagnosis not present

## 2015-09-18 DIAGNOSIS — D509 Iron deficiency anemia, unspecified: Secondary | ICD-10-CM | POA: Diagnosis not present

## 2015-09-18 DIAGNOSIS — N2581 Secondary hyperparathyroidism of renal origin: Secondary | ICD-10-CM | POA: Diagnosis not present

## 2015-09-20 DIAGNOSIS — N2581 Secondary hyperparathyroidism of renal origin: Secondary | ICD-10-CM | POA: Diagnosis not present

## 2015-09-20 DIAGNOSIS — N186 End stage renal disease: Secondary | ICD-10-CM | POA: Diagnosis not present

## 2015-09-20 DIAGNOSIS — D509 Iron deficiency anemia, unspecified: Secondary | ICD-10-CM | POA: Diagnosis not present

## 2015-09-22 DIAGNOSIS — D509 Iron deficiency anemia, unspecified: Secondary | ICD-10-CM | POA: Diagnosis not present

## 2015-09-22 DIAGNOSIS — Z992 Dependence on renal dialysis: Secondary | ICD-10-CM | POA: Diagnosis not present

## 2015-09-22 DIAGNOSIS — N186 End stage renal disease: Secondary | ICD-10-CM | POA: Diagnosis not present

## 2015-09-22 DIAGNOSIS — T8610 Unspecified complication of kidney transplant: Secondary | ICD-10-CM | POA: Diagnosis not present

## 2015-09-22 DIAGNOSIS — N2581 Secondary hyperparathyroidism of renal origin: Secondary | ICD-10-CM | POA: Diagnosis not present

## 2015-09-25 DIAGNOSIS — N2581 Secondary hyperparathyroidism of renal origin: Secondary | ICD-10-CM | POA: Diagnosis not present

## 2015-09-25 DIAGNOSIS — N186 End stage renal disease: Secondary | ICD-10-CM | POA: Diagnosis not present

## 2015-09-25 DIAGNOSIS — D631 Anemia in chronic kidney disease: Secondary | ICD-10-CM | POA: Diagnosis not present

## 2015-09-25 DIAGNOSIS — D509 Iron deficiency anemia, unspecified: Secondary | ICD-10-CM | POA: Diagnosis not present

## 2015-09-27 DIAGNOSIS — D509 Iron deficiency anemia, unspecified: Secondary | ICD-10-CM | POA: Diagnosis not present

## 2015-09-27 DIAGNOSIS — D631 Anemia in chronic kidney disease: Secondary | ICD-10-CM | POA: Diagnosis not present

## 2015-09-27 DIAGNOSIS — N186 End stage renal disease: Secondary | ICD-10-CM | POA: Diagnosis not present

## 2015-09-27 DIAGNOSIS — N2581 Secondary hyperparathyroidism of renal origin: Secondary | ICD-10-CM | POA: Diagnosis not present

## 2015-09-29 DIAGNOSIS — N2581 Secondary hyperparathyroidism of renal origin: Secondary | ICD-10-CM | POA: Diagnosis not present

## 2015-09-29 DIAGNOSIS — D631 Anemia in chronic kidney disease: Secondary | ICD-10-CM | POA: Diagnosis not present

## 2015-09-29 DIAGNOSIS — D509 Iron deficiency anemia, unspecified: Secondary | ICD-10-CM | POA: Diagnosis not present

## 2015-09-29 DIAGNOSIS — N186 End stage renal disease: Secondary | ICD-10-CM | POA: Diagnosis not present

## 2015-10-02 DIAGNOSIS — D509 Iron deficiency anemia, unspecified: Secondary | ICD-10-CM | POA: Diagnosis not present

## 2015-10-02 DIAGNOSIS — D631 Anemia in chronic kidney disease: Secondary | ICD-10-CM | POA: Diagnosis not present

## 2015-10-02 DIAGNOSIS — N186 End stage renal disease: Secondary | ICD-10-CM | POA: Diagnosis not present

## 2015-10-02 DIAGNOSIS — N2581 Secondary hyperparathyroidism of renal origin: Secondary | ICD-10-CM | POA: Diagnosis not present

## 2015-10-04 DIAGNOSIS — D509 Iron deficiency anemia, unspecified: Secondary | ICD-10-CM | POA: Diagnosis not present

## 2015-10-04 DIAGNOSIS — N2581 Secondary hyperparathyroidism of renal origin: Secondary | ICD-10-CM | POA: Diagnosis not present

## 2015-10-04 DIAGNOSIS — N186 End stage renal disease: Secondary | ICD-10-CM | POA: Diagnosis not present

## 2015-10-04 DIAGNOSIS — D631 Anemia in chronic kidney disease: Secondary | ICD-10-CM | POA: Diagnosis not present

## 2015-10-06 DIAGNOSIS — N186 End stage renal disease: Secondary | ICD-10-CM | POA: Diagnosis not present

## 2015-10-06 DIAGNOSIS — D631 Anemia in chronic kidney disease: Secondary | ICD-10-CM | POA: Diagnosis not present

## 2015-10-06 DIAGNOSIS — D509 Iron deficiency anemia, unspecified: Secondary | ICD-10-CM | POA: Diagnosis not present

## 2015-10-06 DIAGNOSIS — N2581 Secondary hyperparathyroidism of renal origin: Secondary | ICD-10-CM | POA: Diagnosis not present

## 2015-10-07 ENCOUNTER — Other Ambulatory Visit: Payer: Self-pay | Admitting: Internal Medicine

## 2015-10-09 ENCOUNTER — Other Ambulatory Visit: Payer: Self-pay | Admitting: Internal Medicine

## 2015-10-09 DIAGNOSIS — D509 Iron deficiency anemia, unspecified: Secondary | ICD-10-CM | POA: Diagnosis not present

## 2015-10-09 DIAGNOSIS — N2581 Secondary hyperparathyroidism of renal origin: Secondary | ICD-10-CM | POA: Diagnosis not present

## 2015-10-09 DIAGNOSIS — D631 Anemia in chronic kidney disease: Secondary | ICD-10-CM | POA: Diagnosis not present

## 2015-10-09 DIAGNOSIS — N186 End stage renal disease: Secondary | ICD-10-CM | POA: Diagnosis not present

## 2015-10-11 DIAGNOSIS — N186 End stage renal disease: Secondary | ICD-10-CM | POA: Diagnosis not present

## 2015-10-11 DIAGNOSIS — D631 Anemia in chronic kidney disease: Secondary | ICD-10-CM | POA: Diagnosis not present

## 2015-10-11 DIAGNOSIS — N2581 Secondary hyperparathyroidism of renal origin: Secondary | ICD-10-CM | POA: Diagnosis not present

## 2015-10-11 DIAGNOSIS — D509 Iron deficiency anemia, unspecified: Secondary | ICD-10-CM | POA: Diagnosis not present

## 2015-10-13 DIAGNOSIS — D509 Iron deficiency anemia, unspecified: Secondary | ICD-10-CM | POA: Diagnosis not present

## 2015-10-13 DIAGNOSIS — D631 Anemia in chronic kidney disease: Secondary | ICD-10-CM | POA: Diagnosis not present

## 2015-10-13 DIAGNOSIS — N186 End stage renal disease: Secondary | ICD-10-CM | POA: Diagnosis not present

## 2015-10-13 DIAGNOSIS — N2581 Secondary hyperparathyroidism of renal origin: Secondary | ICD-10-CM | POA: Diagnosis not present

## 2015-10-16 DIAGNOSIS — D631 Anemia in chronic kidney disease: Secondary | ICD-10-CM | POA: Diagnosis not present

## 2015-10-16 DIAGNOSIS — N186 End stage renal disease: Secondary | ICD-10-CM | POA: Diagnosis not present

## 2015-10-16 DIAGNOSIS — D509 Iron deficiency anemia, unspecified: Secondary | ICD-10-CM | POA: Diagnosis not present

## 2015-10-16 DIAGNOSIS — N2581 Secondary hyperparathyroidism of renal origin: Secondary | ICD-10-CM | POA: Diagnosis not present

## 2015-10-17 ENCOUNTER — Ambulatory Visit: Payer: Medicare Other | Admitting: Family Medicine

## 2015-10-18 DIAGNOSIS — D631 Anemia in chronic kidney disease: Secondary | ICD-10-CM | POA: Diagnosis not present

## 2015-10-18 DIAGNOSIS — N186 End stage renal disease: Secondary | ICD-10-CM | POA: Diagnosis not present

## 2015-10-18 DIAGNOSIS — N2581 Secondary hyperparathyroidism of renal origin: Secondary | ICD-10-CM | POA: Diagnosis not present

## 2015-10-18 DIAGNOSIS — D509 Iron deficiency anemia, unspecified: Secondary | ICD-10-CM | POA: Diagnosis not present

## 2015-10-20 DIAGNOSIS — D509 Iron deficiency anemia, unspecified: Secondary | ICD-10-CM | POA: Diagnosis not present

## 2015-10-20 DIAGNOSIS — N2581 Secondary hyperparathyroidism of renal origin: Secondary | ICD-10-CM | POA: Diagnosis not present

## 2015-10-20 DIAGNOSIS — D631 Anemia in chronic kidney disease: Secondary | ICD-10-CM | POA: Diagnosis not present

## 2015-10-20 DIAGNOSIS — N186 End stage renal disease: Secondary | ICD-10-CM | POA: Diagnosis not present

## 2015-10-22 DIAGNOSIS — N186 End stage renal disease: Secondary | ICD-10-CM | POA: Diagnosis not present

## 2015-10-22 DIAGNOSIS — T8610 Unspecified complication of kidney transplant: Secondary | ICD-10-CM | POA: Diagnosis not present

## 2015-10-22 DIAGNOSIS — Z992 Dependence on renal dialysis: Secondary | ICD-10-CM | POA: Diagnosis not present

## 2015-10-23 DIAGNOSIS — N186 End stage renal disease: Secondary | ICD-10-CM | POA: Diagnosis not present

## 2015-10-23 DIAGNOSIS — D631 Anemia in chronic kidney disease: Secondary | ICD-10-CM | POA: Diagnosis not present

## 2015-10-23 DIAGNOSIS — N2581 Secondary hyperparathyroidism of renal origin: Secondary | ICD-10-CM | POA: Diagnosis not present

## 2015-10-25 DIAGNOSIS — N186 End stage renal disease: Secondary | ICD-10-CM | POA: Diagnosis not present

## 2015-10-25 DIAGNOSIS — D631 Anemia in chronic kidney disease: Secondary | ICD-10-CM | POA: Diagnosis not present

## 2015-10-25 DIAGNOSIS — N2581 Secondary hyperparathyroidism of renal origin: Secondary | ICD-10-CM | POA: Diagnosis not present

## 2015-10-27 DIAGNOSIS — N186 End stage renal disease: Secondary | ICD-10-CM | POA: Diagnosis not present

## 2015-10-27 DIAGNOSIS — N2581 Secondary hyperparathyroidism of renal origin: Secondary | ICD-10-CM | POA: Diagnosis not present

## 2015-10-27 DIAGNOSIS — D631 Anemia in chronic kidney disease: Secondary | ICD-10-CM | POA: Diagnosis not present

## 2015-10-30 DIAGNOSIS — D631 Anemia in chronic kidney disease: Secondary | ICD-10-CM | POA: Diagnosis not present

## 2015-10-30 DIAGNOSIS — N186 End stage renal disease: Secondary | ICD-10-CM | POA: Diagnosis not present

## 2015-10-30 DIAGNOSIS — N2581 Secondary hyperparathyroidism of renal origin: Secondary | ICD-10-CM | POA: Diagnosis not present

## 2015-10-31 ENCOUNTER — Encounter: Payer: Self-pay | Admitting: Family Medicine

## 2015-10-31 ENCOUNTER — Ambulatory Visit (INDEPENDENT_AMBULATORY_CARE_PROVIDER_SITE_OTHER): Payer: Medicare Other | Admitting: Family Medicine

## 2015-10-31 VITALS — BP 156/98 | HR 82 | Temp 98.4°F | Resp 14 | Ht 60.0 in | Wt 120.0 lb

## 2015-10-31 DIAGNOSIS — Z0000 Encounter for general adult medical examination without abnormal findings: Secondary | ICD-10-CM

## 2015-10-31 DIAGNOSIS — Z23 Encounter for immunization: Secondary | ICD-10-CM | POA: Diagnosis not present

## 2015-10-31 NOTE — Progress Notes (Signed)
Subjective:  Kerry Cook is a 30 y.o. female who presents for an initial Medicare Wellness Visit.    She has had elevated blood pressure for many years. Her blood pressure has not been controlled at home, today her BP is BP:  156/98 mmHg   She does not workout. She denies chest pain, shortness of breath, dizziness.  She is not on cholesterol medication and denies myalgias. Her cholesterol is at goal.   Names of Other Physician/Practitioners you currently use: 1. [facility] here for primary care  Patient Care Team: [name], FNP as PCP - General (Family Medicine)   Medication Review: Current Outpatient Prescriptions on File Prior to Visit  Medication Sig Dispense Refill  . amLODipine (NORVASC) 10 MG tablet Take 1 tablet (10 mg total) by mouth every evening. 30 tablet 0  . calcium acetate (PHOSLO) 667 MG capsule Take 2,001 mg by mouth 3 (three) times daily with meals.  3  . cloNIDine (CATAPRES) 0.1 MG tablet Take 0.1 mg by mouth 2 (two) times daily.    . [facility] PAIN RELIEF EXTRA STRENGTH 500 MG tablet TAKE 1 TABLET (500 MG TOTAL) BY MOUTH EVERY 6 (SIX) HOURS AS NEEDED. 30 tablet 0  . hydrALAZINE (APRESOLINE) 50 MG tablet Take 1 tablet (50 mg total) by mouth 2 (two) times daily. 60 tablet 0  . labetalol (NORMODYNE) 200 MG tablet Take 1 tablet (200 mg total) by mouth 2 (two) times daily. 60 tablet 0  . losartan (COZAAR) 50 MG tablet Take 1 tablet (50 mg total) by mouth every evening. 30 tablet 0  . multivitamin (RENA-VIT) TABS tablet Take 1 tablet by mouth at bedtime. 30 each 0  . predniSONE (DELTASONE) 5 MG tablet Take 5 mg by mouth daily with breakfast.    . tacrolimus (PROGRAF) 1 MG capsule Take 1 mg by mouth 2 (two) times daily.     . [DISCONTINUED] medroxyPROGESTERone (DEPO-PROVERA) 150 MG/ML injection Inject 1 mL (150 mg total) into the muscle every 3 (three) months. (Patient not taking: Reported on 03/21/2015) 1 mL 3   No current facility-administered  medications on file prior to visit.    Current Problems (verified) Patient Active Problem List   Diagnosis Date Noted  . ESRD (end stage renal disease) on dialysis ([place])   . Cephalalgia   . History of renal transplant   . Headache 06/07/2015  . Hypertensive urgency 02/16/2015  . Absolute anemia   . SOB (shortness of breath) 02/01/2015  . Hypertension   . History of kidney transplant   . Pneumonia 01/23/2015  . Tachycardia   . Symptomatic anemia 01/20/2015  . Supervision of high risk pregnancy, antepartum 06/02/2014  . Anemia in chronic kidney disease 10/01/2007  . THROMBOCYTOPENIA 10/01/2007  . End stage renal disease on dialysis ([place]) 10/01/2007  . PAP SMEAR, LGSIL, ABNORMAL 10/01/2007  . HYPERTENSION, BENIGN SYSTEMIC 08/21/2006  . PROTEINURIA 08/21/2006    Screening Tests Health Maintenance  Topic Date Due  . TETANUS/TDAP  09/22/2005  . PAP SMEAR  06/03/2015  . HIV Screening  Completed    Immunization History  Administered Date(s) Administered  . Influenza,inj,Quad PF,36+ Mos 06/02/2014, 02/23/2015    Preventative care:  Last pap smear/pelvic exam: 06/03/2015   This medication is not covered by your insurance if dispensed from your Physician's office. You can obtain your vaccine at an area Pharmacy.    Medication List       This list is accurate as of: 10/31/15 10:40 AM.  Always use your most recent med list.               amLODipine 10 MG tablet  Commonly known as:  NORVASC  Take 1 tablet (10 mg total) by mouth every evening.     calcium acetate 667 MG capsule  Commonly known as:  PHOSLO  Take 2,001 mg by mouth 3 (three) times daily with meals.     cloNIDine 0.1 MG tablet  Commonly known as:  CATAPRES  Take 0.1 mg by mouth 2 (two) times daily.     [facility] PAIN RELIEF EXTRA STRENGTH 500 MG tablet  Generic drug:  acetaminophen  TAKE 1 TABLET (500 MG TOTAL) BY MOUTH EVERY 6 (SIX) HOURS AS NEEDED.     hydrALAZINE 50 MG tablet  Commonly known as:   APRESOLINE  Take 1 tablet (50 mg total) by mouth 2 (two) times daily.     labetalol 200 MG tablet  Commonly known as:  NORMODYNE  Take 1 tablet (200 mg total) by mouth 2 (two) times daily.     losartan 50 MG tablet  Commonly known as:  COZAAR  Take 1 tablet (50 mg total) by mouth every evening.     multivitamin Tabs tablet  Take 1 tablet by mouth at bedtime.     predniSONE 5 MG tablet  Commonly known as:  DELTASONE  Take 5 mg by mouth daily with breakfast.     tacrolimus 1 MG capsule  Commonly known as:  PROGRAF  Take 1 mg by mouth 2 (two) times daily.       Past Surgical History  Procedure Laterality Date  . Kidney transplant Bilateral 2012   Family History  Problem Relation Age of Onset  . Family history unknown: Yes    History reviewed: allergies, current medications, past family history, past medical history, past social history, past surgical history and problem list    Tobacco Social History  Substance Use Topics  . Smoking status: Never Smoker   . Smokeless tobacco: Never Used  . Alcohol Use: No   She does not smoke.  Patient is not a former smoker.  Alcohol Current alcohol use: none  Caffeine Current caffeine use: denies use  Exercise Current exercise: no regular exercise  Nutrition/Diet Current diet:  Renal diet  Cardiac risk factors:   Depression Screen  Depression screen [facility] 2/9 10/31/2015 06/30/2015  Decreased Interest 0 0  Down, Depressed, Hopeless 0 0  PHQ - 2 Score 0 0    (Activities of Daily Living Managing money?  No Feeding yourself?  No Getting from bed to chair? No Climbing a flight of stairs? No Preparing food and eating?: No Bathing or showering? No Getting dressed: No Getting to the toilet? No Moving around from place to place: No In the past year have you fallen or had a near fall?: No   Are you sexually active?  No  Do you have more than one partner? No  Vision Difficulties: Last eye examination 1 year ago.    Hearing Difficulties:  Do you often ask people to speak up or repeat themselves? No Do you experience ringing or noises in your ears? No Do you have difficulty understanding soft or whispered voices? No  Cognition  Do you feel that you have a problem with memory?No  Do you feel safe at home?  Yes  Advanced directives Does patient have a [place]? No Does patient have a Living Will? No   Objective:  Blood pressure 156/98, pulse 82, temperature 98.4 F (36.9 C), temperature source Oral, resp. rate 14, height 5' (1.524 m), weight 120 lb (54.432 kg), SpO2 100 %, unknown if currently breastfeeding. Body mass index is 23.44 kg/(m^2).  General appearance: alert, no distress, WD/WN, female HEENT: normocephalic, sclerae anicteric, TMs pearly, nares patent, no discharge or erythema, pharynx normal Oral cavity: MMM, no lesions Neck: supple, no lymphadenopathy, no thyromegaly, no masses Heart: RRR, normal S1, S2, no murmurs Lungs: CTA bilaterally, no wheezes, rhonchi, or rales Abdomen: +bs, soft, non tender, non distended, no masses, no hepatomegaly, no splenomegaly Musculoskeletal: nontender, no swelling, no obvious deformity Extremities: no edema, no cyanosis, no clubbing Pulses: 2+ symmetric, upper and lower extremities, normal cap refill Neurological: alert, oriented x 3, CN2-12 intact, strength normal upper extremities and lower extremities, sensation normal throughout, DTRs 2+ throughout, no cerebellar signs, gait normal Psychiatric: normal affect, behavior normal, pleasant   Assessment:  Patient denies any difficulties at home. No trouble with ADLs, depression or falls. No recent changes to vision or hearing. Is UTD with immunizations. Is UTD with screening. Discussed Advanced Directives, patient agrees to bring [place] copies of documents if can. Encouraged heart healthy diet, exercise as tolerated and adequate sleep. Declines flu shot. Pap smear up to date.      Plan:   During the course of the visit the patient was educated and counseled about appropriate screening and preventive services including:    Pneumococcal vaccine   Influenza vaccine  Td vaccine  Advanced directives: requested  Screening recommendations, referrals: Vaccinations: Please see documentation below and orders this visit.  Nutrition assessed and recommended  Recommended yearly ophthalmology/optometry visit for glaucoma screening and checkup Recommended yearly dental visit for hygiene and checkup Advanced directives - requested  Conditions/risks identified: BMI: Discussed weight loss, diet, and increase physical activity.  Increase physical activity: AHA recommends 150 minutes of physical activity a week.  Medications reviewed Patient is on dialysis.  Fall risk: Patient is a low fall risk   Medicare Attestation I have personally reviewed: The patient's medical and social history Their use of alcohol, tobacco or illicit drugs Their current medications and supplements The patient's functional ability including ADLs,fall risks, home safety risks, cognitive, and hearing and visual impairment Diet and physical activities Evidence for depression or mood disorders  The patient's weight, height, BMI, and visual acuity have been recorded in the chart.  I have made referrals, counseling, and provided education to the patient based on review of the above and I have provided the patient with a written personalized care plan for preventive services.     Lydia Meng Jerilynn Mages, FNP   10/31/2015

## 2015-11-01 DIAGNOSIS — N186 End stage renal disease: Secondary | ICD-10-CM | POA: Diagnosis not present

## 2015-11-01 DIAGNOSIS — N2581 Secondary hyperparathyroidism of renal origin: Secondary | ICD-10-CM | POA: Diagnosis not present

## 2015-11-01 DIAGNOSIS — D631 Anemia in chronic kidney disease: Secondary | ICD-10-CM | POA: Diagnosis not present

## 2015-11-03 DIAGNOSIS — N186 End stage renal disease: Secondary | ICD-10-CM | POA: Diagnosis not present

## 2015-11-03 DIAGNOSIS — N2581 Secondary hyperparathyroidism of renal origin: Secondary | ICD-10-CM | POA: Diagnosis not present

## 2015-11-03 DIAGNOSIS — D631 Anemia in chronic kidney disease: Secondary | ICD-10-CM | POA: Diagnosis not present

## 2015-11-06 DIAGNOSIS — N186 End stage renal disease: Secondary | ICD-10-CM | POA: Diagnosis not present

## 2015-11-06 DIAGNOSIS — D631 Anemia in chronic kidney disease: Secondary | ICD-10-CM | POA: Diagnosis not present

## 2015-11-06 DIAGNOSIS — N2581 Secondary hyperparathyroidism of renal origin: Secondary | ICD-10-CM | POA: Diagnosis not present

## 2015-11-08 DIAGNOSIS — N2581 Secondary hyperparathyroidism of renal origin: Secondary | ICD-10-CM | POA: Diagnosis not present

## 2015-11-08 DIAGNOSIS — D631 Anemia in chronic kidney disease: Secondary | ICD-10-CM | POA: Diagnosis not present

## 2015-11-08 DIAGNOSIS — N186 End stage renal disease: Secondary | ICD-10-CM | POA: Diagnosis not present

## 2015-11-10 DIAGNOSIS — N186 End stage renal disease: Secondary | ICD-10-CM | POA: Diagnosis not present

## 2015-11-10 DIAGNOSIS — D631 Anemia in chronic kidney disease: Secondary | ICD-10-CM | POA: Diagnosis not present

## 2015-11-10 DIAGNOSIS — N2581 Secondary hyperparathyroidism of renal origin: Secondary | ICD-10-CM | POA: Diagnosis not present

## 2015-11-13 DIAGNOSIS — N186 End stage renal disease: Secondary | ICD-10-CM | POA: Diagnosis not present

## 2015-11-13 DIAGNOSIS — N2581 Secondary hyperparathyroidism of renal origin: Secondary | ICD-10-CM | POA: Diagnosis not present

## 2015-11-13 DIAGNOSIS — D631 Anemia in chronic kidney disease: Secondary | ICD-10-CM | POA: Diagnosis not present

## 2015-11-15 DIAGNOSIS — N2581 Secondary hyperparathyroidism of renal origin: Secondary | ICD-10-CM | POA: Diagnosis not present

## 2015-11-15 DIAGNOSIS — D631 Anemia in chronic kidney disease: Secondary | ICD-10-CM | POA: Diagnosis not present

## 2015-11-15 DIAGNOSIS — N186 End stage renal disease: Secondary | ICD-10-CM | POA: Diagnosis not present

## 2015-11-17 DIAGNOSIS — N2581 Secondary hyperparathyroidism of renal origin: Secondary | ICD-10-CM | POA: Diagnosis not present

## 2015-11-17 DIAGNOSIS — D631 Anemia in chronic kidney disease: Secondary | ICD-10-CM | POA: Diagnosis not present

## 2015-11-17 DIAGNOSIS — N186 End stage renal disease: Secondary | ICD-10-CM | POA: Diagnosis not present

## 2015-11-20 DIAGNOSIS — N186 End stage renal disease: Secondary | ICD-10-CM | POA: Diagnosis not present

## 2015-11-20 DIAGNOSIS — D631 Anemia in chronic kidney disease: Secondary | ICD-10-CM | POA: Diagnosis not present

## 2015-11-20 DIAGNOSIS — N2581 Secondary hyperparathyroidism of renal origin: Secondary | ICD-10-CM | POA: Diagnosis not present

## 2015-11-22 DIAGNOSIS — N186 End stage renal disease: Secondary | ICD-10-CM | POA: Diagnosis not present

## 2015-11-22 DIAGNOSIS — Z992 Dependence on renal dialysis: Secondary | ICD-10-CM | POA: Diagnosis not present

## 2015-11-22 DIAGNOSIS — D631 Anemia in chronic kidney disease: Secondary | ICD-10-CM | POA: Diagnosis not present

## 2015-11-22 DIAGNOSIS — T8610 Unspecified complication of kidney transplant: Secondary | ICD-10-CM | POA: Diagnosis not present

## 2015-11-22 DIAGNOSIS — N2581 Secondary hyperparathyroidism of renal origin: Secondary | ICD-10-CM | POA: Diagnosis not present

## 2015-11-24 DIAGNOSIS — D631 Anemia in chronic kidney disease: Secondary | ICD-10-CM | POA: Diagnosis not present

## 2015-11-24 DIAGNOSIS — N2581 Secondary hyperparathyroidism of renal origin: Secondary | ICD-10-CM | POA: Diagnosis not present

## 2015-11-24 DIAGNOSIS — N186 End stage renal disease: Secondary | ICD-10-CM | POA: Diagnosis not present

## 2015-11-27 DIAGNOSIS — D631 Anemia in chronic kidney disease: Secondary | ICD-10-CM | POA: Diagnosis not present

## 2015-11-27 DIAGNOSIS — N186 End stage renal disease: Secondary | ICD-10-CM | POA: Diagnosis not present

## 2015-11-27 DIAGNOSIS — N2581 Secondary hyperparathyroidism of renal origin: Secondary | ICD-10-CM | POA: Diagnosis not present

## 2015-11-29 DIAGNOSIS — D631 Anemia in chronic kidney disease: Secondary | ICD-10-CM | POA: Diagnosis not present

## 2015-11-29 DIAGNOSIS — N2581 Secondary hyperparathyroidism of renal origin: Secondary | ICD-10-CM | POA: Diagnosis not present

## 2015-11-29 DIAGNOSIS — N186 End stage renal disease: Secondary | ICD-10-CM | POA: Diagnosis not present

## 2015-12-01 DIAGNOSIS — D631 Anemia in chronic kidney disease: Secondary | ICD-10-CM | POA: Diagnosis not present

## 2015-12-01 DIAGNOSIS — N186 End stage renal disease: Secondary | ICD-10-CM | POA: Diagnosis not present

## 2015-12-01 DIAGNOSIS — N2581 Secondary hyperparathyroidism of renal origin: Secondary | ICD-10-CM | POA: Diagnosis not present

## 2015-12-04 DIAGNOSIS — N2581 Secondary hyperparathyroidism of renal origin: Secondary | ICD-10-CM | POA: Diagnosis not present

## 2015-12-04 DIAGNOSIS — N186 End stage renal disease: Secondary | ICD-10-CM | POA: Diagnosis not present

## 2015-12-04 DIAGNOSIS — D631 Anemia in chronic kidney disease: Secondary | ICD-10-CM | POA: Diagnosis not present

## 2015-12-06 DIAGNOSIS — N186 End stage renal disease: Secondary | ICD-10-CM | POA: Diagnosis not present

## 2015-12-06 DIAGNOSIS — D631 Anemia in chronic kidney disease: Secondary | ICD-10-CM | POA: Diagnosis not present

## 2015-12-06 DIAGNOSIS — N2581 Secondary hyperparathyroidism of renal origin: Secondary | ICD-10-CM | POA: Diagnosis not present

## 2015-12-08 DIAGNOSIS — N186 End stage renal disease: Secondary | ICD-10-CM | POA: Diagnosis not present

## 2015-12-08 DIAGNOSIS — D631 Anemia in chronic kidney disease: Secondary | ICD-10-CM | POA: Diagnosis not present

## 2015-12-08 DIAGNOSIS — N2581 Secondary hyperparathyroidism of renal origin: Secondary | ICD-10-CM | POA: Diagnosis not present

## 2015-12-11 DIAGNOSIS — N2581 Secondary hyperparathyroidism of renal origin: Secondary | ICD-10-CM | POA: Diagnosis not present

## 2015-12-11 DIAGNOSIS — N186 End stage renal disease: Secondary | ICD-10-CM | POA: Diagnosis not present

## 2015-12-11 DIAGNOSIS — D631 Anemia in chronic kidney disease: Secondary | ICD-10-CM | POA: Diagnosis not present

## 2015-12-13 DIAGNOSIS — N186 End stage renal disease: Secondary | ICD-10-CM | POA: Diagnosis not present

## 2015-12-13 DIAGNOSIS — N2581 Secondary hyperparathyroidism of renal origin: Secondary | ICD-10-CM | POA: Diagnosis not present

## 2015-12-13 DIAGNOSIS — D631 Anemia in chronic kidney disease: Secondary | ICD-10-CM | POA: Diagnosis not present

## 2015-12-14 ENCOUNTER — Other Ambulatory Visit: Payer: Self-pay | Admitting: Internal Medicine

## 2015-12-15 DIAGNOSIS — D631 Anemia in chronic kidney disease: Secondary | ICD-10-CM | POA: Diagnosis not present

## 2015-12-15 DIAGNOSIS — N2581 Secondary hyperparathyroidism of renal origin: Secondary | ICD-10-CM | POA: Diagnosis not present

## 2015-12-15 DIAGNOSIS — N186 End stage renal disease: Secondary | ICD-10-CM | POA: Diagnosis not present

## 2015-12-18 DIAGNOSIS — N186 End stage renal disease: Secondary | ICD-10-CM | POA: Diagnosis not present

## 2015-12-18 DIAGNOSIS — D631 Anemia in chronic kidney disease: Secondary | ICD-10-CM | POA: Diagnosis not present

## 2015-12-18 DIAGNOSIS — N2581 Secondary hyperparathyroidism of renal origin: Secondary | ICD-10-CM | POA: Diagnosis not present

## 2015-12-20 DIAGNOSIS — N186 End stage renal disease: Secondary | ICD-10-CM | POA: Diagnosis not present

## 2015-12-20 DIAGNOSIS — D631 Anemia in chronic kidney disease: Secondary | ICD-10-CM | POA: Diagnosis not present

## 2015-12-20 DIAGNOSIS — N2581 Secondary hyperparathyroidism of renal origin: Secondary | ICD-10-CM | POA: Diagnosis not present

## 2015-12-22 DIAGNOSIS — N186 End stage renal disease: Secondary | ICD-10-CM | POA: Diagnosis not present

## 2015-12-22 DIAGNOSIS — D631 Anemia in chronic kidney disease: Secondary | ICD-10-CM | POA: Diagnosis not present

## 2015-12-22 DIAGNOSIS — Z992 Dependence on renal dialysis: Secondary | ICD-10-CM | POA: Diagnosis not present

## 2015-12-22 DIAGNOSIS — N2581 Secondary hyperparathyroidism of renal origin: Secondary | ICD-10-CM | POA: Diagnosis not present

## 2015-12-22 DIAGNOSIS — T8610 Unspecified complication of kidney transplant: Secondary | ICD-10-CM | POA: Diagnosis not present

## 2015-12-25 DIAGNOSIS — N2581 Secondary hyperparathyroidism of renal origin: Secondary | ICD-10-CM | POA: Diagnosis not present

## 2015-12-25 DIAGNOSIS — N186 End stage renal disease: Secondary | ICD-10-CM | POA: Diagnosis not present

## 2015-12-27 DIAGNOSIS — N2581 Secondary hyperparathyroidism of renal origin: Secondary | ICD-10-CM | POA: Diagnosis not present

## 2015-12-27 DIAGNOSIS — N186 End stage renal disease: Secondary | ICD-10-CM | POA: Diagnosis not present

## 2015-12-29 DIAGNOSIS — N2581 Secondary hyperparathyroidism of renal origin: Secondary | ICD-10-CM | POA: Diagnosis not present

## 2015-12-29 DIAGNOSIS — N186 End stage renal disease: Secondary | ICD-10-CM | POA: Diagnosis not present

## 2016-01-01 DIAGNOSIS — N2581 Secondary hyperparathyroidism of renal origin: Secondary | ICD-10-CM | POA: Diagnosis not present

## 2016-01-01 DIAGNOSIS — N186 End stage renal disease: Secondary | ICD-10-CM | POA: Diagnosis not present

## 2016-01-03 DIAGNOSIS — N186 End stage renal disease: Secondary | ICD-10-CM | POA: Diagnosis not present

## 2016-01-03 DIAGNOSIS — N2581 Secondary hyperparathyroidism of renal origin: Secondary | ICD-10-CM | POA: Diagnosis not present

## 2016-01-05 DIAGNOSIS — N2581 Secondary hyperparathyroidism of renal origin: Secondary | ICD-10-CM | POA: Diagnosis not present

## 2016-01-05 DIAGNOSIS — N186 End stage renal disease: Secondary | ICD-10-CM | POA: Diagnosis not present

## 2016-01-08 DIAGNOSIS — N186 End stage renal disease: Secondary | ICD-10-CM | POA: Diagnosis not present

## 2016-01-08 DIAGNOSIS — N2581 Secondary hyperparathyroidism of renal origin: Secondary | ICD-10-CM | POA: Diagnosis not present

## 2016-01-10 DIAGNOSIS — N2581 Secondary hyperparathyroidism of renal origin: Secondary | ICD-10-CM | POA: Diagnosis not present

## 2016-01-10 DIAGNOSIS — N186 End stage renal disease: Secondary | ICD-10-CM | POA: Diagnosis not present

## 2016-01-12 DIAGNOSIS — N186 End stage renal disease: Secondary | ICD-10-CM | POA: Diagnosis not present

## 2016-01-12 DIAGNOSIS — N2581 Secondary hyperparathyroidism of renal origin: Secondary | ICD-10-CM | POA: Diagnosis not present

## 2016-01-15 DIAGNOSIS — N186 End stage renal disease: Secondary | ICD-10-CM | POA: Diagnosis not present

## 2016-01-15 DIAGNOSIS — N2581 Secondary hyperparathyroidism of renal origin: Secondary | ICD-10-CM | POA: Diagnosis not present

## 2016-01-17 DIAGNOSIS — N186 End stage renal disease: Secondary | ICD-10-CM | POA: Diagnosis not present

## 2016-01-17 DIAGNOSIS — N2581 Secondary hyperparathyroidism of renal origin: Secondary | ICD-10-CM | POA: Diagnosis not present

## 2016-01-19 DIAGNOSIS — N2581 Secondary hyperparathyroidism of renal origin: Secondary | ICD-10-CM | POA: Diagnosis not present

## 2016-01-19 DIAGNOSIS — N186 End stage renal disease: Secondary | ICD-10-CM | POA: Diagnosis not present

## 2016-01-22 DIAGNOSIS — N2581 Secondary hyperparathyroidism of renal origin: Secondary | ICD-10-CM | POA: Diagnosis not present

## 2016-01-22 DIAGNOSIS — T8610 Unspecified complication of kidney transplant: Secondary | ICD-10-CM | POA: Diagnosis not present

## 2016-01-22 DIAGNOSIS — Z992 Dependence on renal dialysis: Secondary | ICD-10-CM | POA: Diagnosis not present

## 2016-01-22 DIAGNOSIS — N186 End stage renal disease: Secondary | ICD-10-CM | POA: Diagnosis not present

## 2016-01-24 DIAGNOSIS — D631 Anemia in chronic kidney disease: Secondary | ICD-10-CM | POA: Diagnosis not present

## 2016-01-24 DIAGNOSIS — N186 End stage renal disease: Secondary | ICD-10-CM | POA: Diagnosis not present

## 2016-01-24 DIAGNOSIS — N2581 Secondary hyperparathyroidism of renal origin: Secondary | ICD-10-CM | POA: Diagnosis not present

## 2016-01-26 DIAGNOSIS — N2581 Secondary hyperparathyroidism of renal origin: Secondary | ICD-10-CM | POA: Diagnosis not present

## 2016-01-26 DIAGNOSIS — N186 End stage renal disease: Secondary | ICD-10-CM | POA: Diagnosis not present

## 2016-01-26 DIAGNOSIS — D631 Anemia in chronic kidney disease: Secondary | ICD-10-CM | POA: Diagnosis not present

## 2016-01-29 DIAGNOSIS — D631 Anemia in chronic kidney disease: Secondary | ICD-10-CM | POA: Diagnosis not present

## 2016-01-29 DIAGNOSIS — N2581 Secondary hyperparathyroidism of renal origin: Secondary | ICD-10-CM | POA: Diagnosis not present

## 2016-01-29 DIAGNOSIS — N186 End stage renal disease: Secondary | ICD-10-CM | POA: Diagnosis not present

## 2016-01-31 DIAGNOSIS — N2581 Secondary hyperparathyroidism of renal origin: Secondary | ICD-10-CM | POA: Diagnosis not present

## 2016-01-31 DIAGNOSIS — D631 Anemia in chronic kidney disease: Secondary | ICD-10-CM | POA: Diagnosis not present

## 2016-01-31 DIAGNOSIS — N186 End stage renal disease: Secondary | ICD-10-CM | POA: Diagnosis not present

## 2016-02-02 DIAGNOSIS — N2581 Secondary hyperparathyroidism of renal origin: Secondary | ICD-10-CM | POA: Diagnosis not present

## 2016-02-02 DIAGNOSIS — N186 End stage renal disease: Secondary | ICD-10-CM | POA: Diagnosis not present

## 2016-02-02 DIAGNOSIS — D631 Anemia in chronic kidney disease: Secondary | ICD-10-CM | POA: Diagnosis not present

## 2016-02-05 DIAGNOSIS — N186 End stage renal disease: Secondary | ICD-10-CM | POA: Diagnosis not present

## 2016-02-05 DIAGNOSIS — N2581 Secondary hyperparathyroidism of renal origin: Secondary | ICD-10-CM | POA: Diagnosis not present

## 2016-02-05 DIAGNOSIS — D631 Anemia in chronic kidney disease: Secondary | ICD-10-CM | POA: Diagnosis not present

## 2016-02-07 DIAGNOSIS — D631 Anemia in chronic kidney disease: Secondary | ICD-10-CM | POA: Diagnosis not present

## 2016-02-07 DIAGNOSIS — N2581 Secondary hyperparathyroidism of renal origin: Secondary | ICD-10-CM | POA: Diagnosis not present

## 2016-02-07 DIAGNOSIS — N186 End stage renal disease: Secondary | ICD-10-CM | POA: Diagnosis not present

## 2016-02-09 DIAGNOSIS — N2581 Secondary hyperparathyroidism of renal origin: Secondary | ICD-10-CM | POA: Diagnosis not present

## 2016-02-09 DIAGNOSIS — D631 Anemia in chronic kidney disease: Secondary | ICD-10-CM | POA: Diagnosis not present

## 2016-02-09 DIAGNOSIS — N186 End stage renal disease: Secondary | ICD-10-CM | POA: Diagnosis not present

## 2016-02-12 DIAGNOSIS — N186 End stage renal disease: Secondary | ICD-10-CM | POA: Diagnosis not present

## 2016-02-12 DIAGNOSIS — D631 Anemia in chronic kidney disease: Secondary | ICD-10-CM | POA: Diagnosis not present

## 2016-02-12 DIAGNOSIS — N2581 Secondary hyperparathyroidism of renal origin: Secondary | ICD-10-CM | POA: Diagnosis not present

## 2016-02-14 DIAGNOSIS — N2581 Secondary hyperparathyroidism of renal origin: Secondary | ICD-10-CM | POA: Diagnosis not present

## 2016-02-14 DIAGNOSIS — N186 End stage renal disease: Secondary | ICD-10-CM | POA: Diagnosis not present

## 2016-02-14 DIAGNOSIS — D631 Anemia in chronic kidney disease: Secondary | ICD-10-CM | POA: Diagnosis not present

## 2016-02-16 DIAGNOSIS — D631 Anemia in chronic kidney disease: Secondary | ICD-10-CM | POA: Diagnosis not present

## 2016-02-16 DIAGNOSIS — N2581 Secondary hyperparathyroidism of renal origin: Secondary | ICD-10-CM | POA: Diagnosis not present

## 2016-02-16 DIAGNOSIS — N186 End stage renal disease: Secondary | ICD-10-CM | POA: Diagnosis not present

## 2016-02-19 DIAGNOSIS — D631 Anemia in chronic kidney disease: Secondary | ICD-10-CM | POA: Diagnosis not present

## 2016-02-19 DIAGNOSIS — N2581 Secondary hyperparathyroidism of renal origin: Secondary | ICD-10-CM | POA: Diagnosis not present

## 2016-02-19 DIAGNOSIS — N186 End stage renal disease: Secondary | ICD-10-CM | POA: Diagnosis not present

## 2016-02-21 DIAGNOSIS — N186 End stage renal disease: Secondary | ICD-10-CM | POA: Diagnosis not present

## 2016-02-21 DIAGNOSIS — N2581 Secondary hyperparathyroidism of renal origin: Secondary | ICD-10-CM | POA: Diagnosis not present

## 2016-02-21 DIAGNOSIS — D631 Anemia in chronic kidney disease: Secondary | ICD-10-CM | POA: Diagnosis not present

## 2016-02-22 DIAGNOSIS — T8610 Unspecified complication of kidney transplant: Secondary | ICD-10-CM | POA: Diagnosis not present

## 2016-02-22 DIAGNOSIS — N186 End stage renal disease: Secondary | ICD-10-CM | POA: Diagnosis not present

## 2016-02-22 DIAGNOSIS — Z992 Dependence on renal dialysis: Secondary | ICD-10-CM | POA: Diagnosis not present

## 2016-02-23 DIAGNOSIS — Z23 Encounter for immunization: Secondary | ICD-10-CM | POA: Diagnosis not present

## 2016-02-23 DIAGNOSIS — N2581 Secondary hyperparathyroidism of renal origin: Secondary | ICD-10-CM | POA: Diagnosis not present

## 2016-02-23 DIAGNOSIS — N186 End stage renal disease: Secondary | ICD-10-CM | POA: Diagnosis not present

## 2016-02-23 DIAGNOSIS — D631 Anemia in chronic kidney disease: Secondary | ICD-10-CM | POA: Diagnosis not present

## 2016-02-26 DIAGNOSIS — N2581 Secondary hyperparathyroidism of renal origin: Secondary | ICD-10-CM | POA: Diagnosis not present

## 2016-02-26 DIAGNOSIS — Z23 Encounter for immunization: Secondary | ICD-10-CM | POA: Diagnosis not present

## 2016-02-26 DIAGNOSIS — D631 Anemia in chronic kidney disease: Secondary | ICD-10-CM | POA: Diagnosis not present

## 2016-02-26 DIAGNOSIS — N186 End stage renal disease: Secondary | ICD-10-CM | POA: Diagnosis not present

## 2016-02-28 DIAGNOSIS — Z23 Encounter for immunization: Secondary | ICD-10-CM | POA: Diagnosis not present

## 2016-02-28 DIAGNOSIS — D631 Anemia in chronic kidney disease: Secondary | ICD-10-CM | POA: Diagnosis not present

## 2016-02-28 DIAGNOSIS — N186 End stage renal disease: Secondary | ICD-10-CM | POA: Diagnosis not present

## 2016-02-28 DIAGNOSIS — N2581 Secondary hyperparathyroidism of renal origin: Secondary | ICD-10-CM | POA: Diagnosis not present

## 2016-03-01 DIAGNOSIS — D631 Anemia in chronic kidney disease: Secondary | ICD-10-CM | POA: Diagnosis not present

## 2016-03-01 DIAGNOSIS — N186 End stage renal disease: Secondary | ICD-10-CM | POA: Diagnosis not present

## 2016-03-01 DIAGNOSIS — Z23 Encounter for immunization: Secondary | ICD-10-CM | POA: Diagnosis not present

## 2016-03-01 DIAGNOSIS — N2581 Secondary hyperparathyroidism of renal origin: Secondary | ICD-10-CM | POA: Diagnosis not present

## 2016-03-04 DIAGNOSIS — N2581 Secondary hyperparathyroidism of renal origin: Secondary | ICD-10-CM | POA: Diagnosis not present

## 2016-03-04 DIAGNOSIS — Z23 Encounter for immunization: Secondary | ICD-10-CM | POA: Diagnosis not present

## 2016-03-04 DIAGNOSIS — N186 End stage renal disease: Secondary | ICD-10-CM | POA: Diagnosis not present

## 2016-03-04 DIAGNOSIS — D631 Anemia in chronic kidney disease: Secondary | ICD-10-CM | POA: Diagnosis not present

## 2016-03-06 DIAGNOSIS — N2581 Secondary hyperparathyroidism of renal origin: Secondary | ICD-10-CM | POA: Diagnosis not present

## 2016-03-06 DIAGNOSIS — D631 Anemia in chronic kidney disease: Secondary | ICD-10-CM | POA: Diagnosis not present

## 2016-03-06 DIAGNOSIS — Z23 Encounter for immunization: Secondary | ICD-10-CM | POA: Diagnosis not present

## 2016-03-06 DIAGNOSIS — N186 End stage renal disease: Secondary | ICD-10-CM | POA: Diagnosis not present

## 2016-03-08 DIAGNOSIS — N2581 Secondary hyperparathyroidism of renal origin: Secondary | ICD-10-CM | POA: Diagnosis not present

## 2016-03-08 DIAGNOSIS — N186 End stage renal disease: Secondary | ICD-10-CM | POA: Diagnosis not present

## 2016-03-08 DIAGNOSIS — Z23 Encounter for immunization: Secondary | ICD-10-CM | POA: Diagnosis not present

## 2016-03-08 DIAGNOSIS — D631 Anemia in chronic kidney disease: Secondary | ICD-10-CM | POA: Diagnosis not present

## 2016-03-11 DIAGNOSIS — N2581 Secondary hyperparathyroidism of renal origin: Secondary | ICD-10-CM | POA: Diagnosis not present

## 2016-03-11 DIAGNOSIS — Z23 Encounter for immunization: Secondary | ICD-10-CM | POA: Diagnosis not present

## 2016-03-11 DIAGNOSIS — D631 Anemia in chronic kidney disease: Secondary | ICD-10-CM | POA: Diagnosis not present

## 2016-03-11 DIAGNOSIS — N186 End stage renal disease: Secondary | ICD-10-CM | POA: Diagnosis not present

## 2016-03-13 DIAGNOSIS — N2581 Secondary hyperparathyroidism of renal origin: Secondary | ICD-10-CM | POA: Diagnosis not present

## 2016-03-13 DIAGNOSIS — Z23 Encounter for immunization: Secondary | ICD-10-CM | POA: Diagnosis not present

## 2016-03-13 DIAGNOSIS — D631 Anemia in chronic kidney disease: Secondary | ICD-10-CM | POA: Diagnosis not present

## 2016-03-13 DIAGNOSIS — N186 End stage renal disease: Secondary | ICD-10-CM | POA: Diagnosis not present

## 2016-03-15 DIAGNOSIS — N186 End stage renal disease: Secondary | ICD-10-CM | POA: Diagnosis not present

## 2016-03-15 DIAGNOSIS — Z23 Encounter for immunization: Secondary | ICD-10-CM | POA: Diagnosis not present

## 2016-03-15 DIAGNOSIS — D631 Anemia in chronic kidney disease: Secondary | ICD-10-CM | POA: Diagnosis not present

## 2016-03-15 DIAGNOSIS — N2581 Secondary hyperparathyroidism of renal origin: Secondary | ICD-10-CM | POA: Diagnosis not present

## 2016-03-18 DIAGNOSIS — Z23 Encounter for immunization: Secondary | ICD-10-CM | POA: Diagnosis not present

## 2016-03-18 DIAGNOSIS — D631 Anemia in chronic kidney disease: Secondary | ICD-10-CM | POA: Diagnosis not present

## 2016-03-18 DIAGNOSIS — N186 End stage renal disease: Secondary | ICD-10-CM | POA: Diagnosis not present

## 2016-03-18 DIAGNOSIS — N2581 Secondary hyperparathyroidism of renal origin: Secondary | ICD-10-CM | POA: Diagnosis not present

## 2016-03-20 DIAGNOSIS — N2581 Secondary hyperparathyroidism of renal origin: Secondary | ICD-10-CM | POA: Diagnosis not present

## 2016-03-20 DIAGNOSIS — Z23 Encounter for immunization: Secondary | ICD-10-CM | POA: Diagnosis not present

## 2016-03-20 DIAGNOSIS — N186 End stage renal disease: Secondary | ICD-10-CM | POA: Diagnosis not present

## 2016-03-20 DIAGNOSIS — D631 Anemia in chronic kidney disease: Secondary | ICD-10-CM | POA: Diagnosis not present

## 2016-03-22 DIAGNOSIS — N2581 Secondary hyperparathyroidism of renal origin: Secondary | ICD-10-CM | POA: Diagnosis not present

## 2016-03-22 DIAGNOSIS — N186 End stage renal disease: Secondary | ICD-10-CM | POA: Diagnosis not present

## 2016-03-22 DIAGNOSIS — D631 Anemia in chronic kidney disease: Secondary | ICD-10-CM | POA: Diagnosis not present

## 2016-03-22 DIAGNOSIS — Z23 Encounter for immunization: Secondary | ICD-10-CM | POA: Diagnosis not present

## 2016-03-23 ENCOUNTER — Encounter (HOSPITAL_COMMUNITY): Payer: Self-pay

## 2016-03-23 ENCOUNTER — Emergency Department (HOSPITAL_COMMUNITY)
Admission: EM | Admit: 2016-03-23 | Discharge: 2016-03-24 | Disposition: A | Discharge status: Home / Self Care | Payer: Medicare Other | Source: Home / Self Care | Attending: Emergency Medicine | Admitting: Emergency Medicine

## 2016-03-23 DIAGNOSIS — R197 Diarrhea, unspecified: Secondary | ICD-10-CM

## 2016-03-23 DIAGNOSIS — Z79899 Other long term (current) drug therapy: Secondary | ICD-10-CM | POA: Insufficient documentation

## 2016-03-23 DIAGNOSIS — I120 Hypertensive chronic kidney disease with stage 5 chronic kidney disease or end stage renal disease: Secondary | ICD-10-CM | POA: Insufficient documentation

## 2016-03-23 DIAGNOSIS — R1084 Generalized abdominal pain: Secondary | ICD-10-CM | POA: Diagnosis not present

## 2016-03-23 DIAGNOSIS — Z992 Dependence on renal dialysis: Secondary | ICD-10-CM | POA: Insufficient documentation

## 2016-03-23 DIAGNOSIS — R1013 Epigastric pain: Secondary | ICD-10-CM

## 2016-03-23 DIAGNOSIS — R110 Nausea: Secondary | ICD-10-CM

## 2016-03-23 DIAGNOSIS — I10 Essential (primary) hypertension: Secondary | ICD-10-CM

## 2016-03-23 DIAGNOSIS — R112 Nausea with vomiting, unspecified: Secondary | ICD-10-CM | POA: Diagnosis not present

## 2016-03-23 DIAGNOSIS — N186 End stage renal disease: Secondary | ICD-10-CM

## 2016-03-23 DIAGNOSIS — T8610 Unspecified complication of kidney transplant: Secondary | ICD-10-CM | POA: Diagnosis not present

## 2016-03-23 NOTE — ED Triage Notes (Signed)
Patient c/o epigastric pain that began today.  Patient states that was nauseas and has had a small amount of diarrhea today.  Patient now just complains of sharp pain in her abdomen.  Denies blood in stool.  Denies any recent sickness or any foods that may have caused the pain.  Rates pain 10/10

## 2016-03-24 ENCOUNTER — Emergency Department (HOSPITAL_COMMUNITY)
Admission: EM | Admit: 2016-03-24 | Discharge: 2016-03-24 | Disposition: A | Discharge status: Home / Self Care | Payer: Medicare Other | Attending: Emergency Medicine | Admitting: Emergency Medicine

## 2016-03-24 ENCOUNTER — Encounter (HOSPITAL_COMMUNITY): Payer: Self-pay | Admitting: Emergency Medicine

## 2016-03-24 ENCOUNTER — Emergency Department (HOSPITAL_COMMUNITY): Payer: Medicare Other

## 2016-03-24 DIAGNOSIS — I120 Hypertensive chronic kidney disease with stage 5 chronic kidney disease or end stage renal disease: Secondary | ICD-10-CM | POA: Diagnosis not present

## 2016-03-24 DIAGNOSIS — Z992 Dependence on renal dialysis: Secondary | ICD-10-CM | POA: Diagnosis not present

## 2016-03-24 DIAGNOSIS — R1084 Generalized abdominal pain: Secondary | ICD-10-CM | POA: Diagnosis not present

## 2016-03-24 DIAGNOSIS — R1013 Epigastric pain: Secondary | ICD-10-CM | POA: Diagnosis not present

## 2016-03-24 DIAGNOSIS — R112 Nausea with vomiting, unspecified: Secondary | ICD-10-CM | POA: Diagnosis not present

## 2016-03-24 DIAGNOSIS — N186 End stage renal disease: Secondary | ICD-10-CM | POA: Diagnosis not present

## 2016-03-24 DIAGNOSIS — Z79899 Other long term (current) drug therapy: Secondary | ICD-10-CM | POA: Diagnosis not present

## 2016-03-24 LAB — COMPREHENSIVE METABOLIC PANEL
ALBUMIN: 4.4 g/dL (ref 3.5–5.0)
ALK PHOS: 69 U/L (ref 38–126)
ALT: 11 U/L — AB (ref 14–54)
ALT: 11 U/L — AB (ref 14–54)
ANION GAP: 14 (ref 5–15)
AST: 20 U/L (ref 15–41)
AST: 20 U/L (ref 15–41)
Albumin: 4.1 g/dL (ref 3.5–5.0)
Alkaline Phosphatase: 77 U/L (ref 38–126)
Anion gap: 16 — ABNORMAL HIGH (ref 5–15)
BUN: 40 mg/dL — AB (ref 6–20)
BUN: 51 mg/dL — ABNORMAL HIGH (ref 6–20)
CALCIUM: 9.7 mg/dL (ref 8.9–10.3)
CHLORIDE: 95 mmol/L — AB (ref 101–111)
CHLORIDE: 95 mmol/L — AB (ref 101–111)
CO2: 27 mmol/L (ref 22–32)
CO2: 28 mmol/L (ref 22–32)
CREATININE: 12.08 mg/dL — AB (ref 0.44–1.00)
CREATININE: 9.96 mg/dL — AB (ref 0.44–1.00)
Calcium: 9.9 mg/dL (ref 8.9–10.3)
GFR calc Af Amer: 5 mL/min — ABNORMAL LOW (ref >60)
GFR, EST AFRICAN AMERICAN: 4 mL/min — AB (ref >60)
GFR, EST NON AFRICAN AMERICAN: 4 mL/min — AB (ref >60)
GFR, EST NON AFRICAN AMERICAN: 5 mL/min — AB (ref >60)
GLUCOSE: 84 mg/dL (ref 65–99)
Glucose, Bld: 81 mg/dL (ref 65–99)
POTASSIUM: 3.8 mmol/L (ref 3.5–5.1)
Potassium: 4.9 mmol/L (ref 3.5–5.1)
SODIUM: 138 mmol/L (ref 135–145)
Sodium: 137 mmol/L (ref 135–145)
Total Bilirubin: 0.7 mg/dL (ref 0.3–1.2)
Total Bilirubin: 0.9 mg/dL (ref 0.3–1.2)
Total Protein: 7.6 g/dL (ref 6.5–8.1)
Total Protein: 8 g/dL (ref 6.5–8.1)

## 2016-03-24 LAB — CBC
HEMATOCRIT: 40.2 % (ref 36.0–46.0)
Hemoglobin: 14 g/dL (ref 12.0–15.0)
MCH: 31.4 pg (ref 26.0–34.0)
MCHC: 34.8 g/dL (ref 30.0–36.0)
MCV: 90.1 fL (ref 78.0–100.0)
PLATELETS: 171 10*3/uL (ref 150–400)
RBC: 4.46 MIL/uL (ref 3.87–5.11)
RDW: 16 % — AB (ref 11.5–15.5)
WBC: 12.7 10*3/uL — AB (ref 4.0–10.5)

## 2016-03-24 LAB — LIPASE, BLOOD
LIPASE: 19 U/L (ref 11–51)
LIPASE: 22 U/L (ref 11–51)

## 2016-03-24 LAB — CBC WITH DIFFERENTIAL/PLATELET
Basophils Absolute: 0 10*3/uL (ref 0.0–0.1)
Basophils Relative: 0 %
EOS ABS: 0.3 10*3/uL (ref 0.0–0.7)
EOS PCT: 3 %
HCT: 39 % (ref 36.0–46.0)
Hemoglobin: 13 g/dL (ref 12.0–15.0)
LYMPHS ABS: 1.6 10*3/uL (ref 0.7–4.0)
LYMPHS PCT: 15 %
MCH: 31.3 pg (ref 26.0–34.0)
MCHC: 33.3 g/dL (ref 30.0–36.0)
MCV: 94 fL (ref 78.0–100.0)
MONOS PCT: 5 %
Monocytes Absolute: 0.5 10*3/uL (ref 0.1–1.0)
Neutro Abs: 8.4 10*3/uL — ABNORMAL HIGH (ref 1.7–7.7)
Neutrophils Relative %: 77 %
PLATELETS: 177 10*3/uL (ref 150–400)
RBC: 4.15 MIL/uL (ref 3.87–5.11)
RDW: 16 % — ABNORMAL HIGH (ref 11.5–15.5)
WBC: 10.7 10*3/uL — AB (ref 4.0–10.5)

## 2016-03-24 LAB — I-STAT BETA HCG BLOOD, ED (MC, WL, AP ONLY): I-stat hCG, quantitative: <5 m[IU]/mL (ref <5)

## 2016-03-24 MED ORDER — FAMOTIDINE IN NACL 20-0.9 MG/50ML-% IV SOLN
20.0000 mg | Freq: Once | INTRAVENOUS | Status: AC
  Administered 2016-03-24: 20 mg via INTRAVENOUS
  Filled 2016-03-24: qty 50

## 2016-03-24 MED ORDER — MORPHINE SULFATE (PF) 4 MG/ML IV SOLN
6.0000 mg | Freq: Once | INTRAVENOUS | Status: AC
  Administered 2016-03-24: 6 mg via INTRAVENOUS
  Filled 2016-03-24: qty 2

## 2016-03-24 MED ORDER — CLONIDINE HCL 0.1 MG PO TABS
0.2000 mg | ORAL_TABLET | Freq: Once | ORAL | Status: AC
  Administered 2016-03-24: 0.2 mg via ORAL
  Filled 2016-03-24: qty 2

## 2016-03-24 MED ORDER — DICYCLOMINE HCL 20 MG PO TABS: 20.0000 mg | ORAL_TABLET | Freq: Two times a day (BID) | ORAL | 0 refills | Status: DC

## 2016-03-24 MED ORDER — IOPAMIDOL (ISOVUE-300) INJECTION 61%
75.0000 mL | Freq: Once | INTRAVENOUS | Status: AC | PRN
  Administered 2016-03-24: 75 mL via INTRAVENOUS

## 2016-03-24 MED ORDER — ONDANSETRON HCL 4 MG/2ML IJ SOLN
4.0000 mg | Freq: Once | INTRAMUSCULAR | Status: AC
  Administered 2016-03-24: 4 mg via INTRAVENOUS
  Filled 2016-03-24: qty 2

## 2016-03-24 MED ORDER — ONDANSETRON 8 MG PO TBDP: 8.0000 mg | ORAL_TABLET | Freq: Three times a day (TID) | ORAL | 0 refills | Status: DC | PRN

## 2016-03-24 MED ORDER — PANTOPRAZOLE SODIUM 20 MG PO TBEC: 20.0000 mg | DELAYED_RELEASE_TABLET | Freq: Every day | ORAL | 0 refills | Status: DC

## 2016-03-24 MED ORDER — HYDROMORPHONE HCL 1 MG/ML IJ SOLN
1.0000 mg | Freq: Once | INTRAMUSCULAR | Status: AC
  Administered 2016-03-24: 1 mg via INTRAVENOUS
  Filled 2016-03-24: qty 1

## 2016-03-24 NOTE — ED Provider Notes (Signed)
[facility] DEPT Provider Note   CSN: [id number] Arrival date & time: 03/24/16  1413     History   Chief Complaint Chief Complaint  Patient presents with  . Abdominal Pain    HPI Kerry Cook is a 30 y.o. female.  HPI Patient reports 48 hours of nausea vomiting and mild diarrhea.  She is a dialysis patient.  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  She does dialysis on Monday Wednesday Friday.  Her last dialysis was Friday.  She was seen emergency room and last night for similar symptoms and states that she felt better time of discharge but began vomiting again.  She does not make any urine at this time.  She denies chest pain and shortness of breath. She did have a kidney transplant which failed.   Past Medical History:  Diagnosis Date  . Chronic kidney disease    previous hx dialysis  . Dialysis patient ([place])   . History of kidney transplant 2012   kidney failure due to hypertension  . Hypertension     Patient Active Problem List   Diagnosis Date Noted  . Medicare annual wellness visit, initial 10/31/2015  . ESRD (end stage renal disease) on dialysis ([place])   . Cephalalgia   . History of renal transplant   . Headache 06/07/2015  . Hypertensive urgency 02/16/2015  . Absolute anemia   . SOB (shortness of breath) 02/01/2015  . Hypertension   . History of kidney transplant   . Pneumonia 01/23/2015  . Tachycardia   . Symptomatic anemia 01/20/2015  . Supervision of high risk pregnancy, antepartum 06/02/2014  . Anemia in chronic kidney disease 10/01/2007  . THROMBOCYTOPENIA 10/01/2007  . End stage renal disease on dialysis ([place]) 10/01/2007  . PAP SMEAR, LGSIL, ABNORMAL 10/01/2007  . HYPERTENSION, BENIGN SYSTEMIC 08/21/2006  . PROTEINURIA 08/21/2006    Past Surgical History:  Procedure Laterality Date  . KIDNEY TRANSPLANT Bilateral 2012    OB History    Gravida Para Term Preterm AB Living   1       1 0   SAB TAB Ectopic Multiple Live  Births   1               Home Medications    Prior to Admission medications   Medication Sig Start Date End Date Taking? Authorizing Provider  amLODipine (NORVASC) 10 MG tablet Take 1 tablet (10 mg total) by mouth every evening. 09/13/15  Yes [name], MD  calcium acetate (PHOSLO) 667 MG capsule Take 2,001 mg by mouth 3 (three) times daily with meals. 05/29/15  Yes Historical Provider, MD  cloNIDine (CATAPRES) 0.1 MG tablet Take 0.1 mg by mouth 2 (two) times daily.   Yes Historical Provider, MD  dicyclomine (BENTYL) 20 MG tablet Take 1 tablet (20 mg total) by mouth 2 (two) times daily. 03/24/16  Yes Orpah Greek, MD  hydrALAZINE (APRESOLINE) 50 MG tablet Take 1 tablet (50 mg total) by mouth 2 (two) times daily. 09/13/15  Yes [name], MD  labetalol (NORMODYNE) 200 MG tablet Take 1 tablet (200 mg total) by mouth 2 (two) times daily. 06/08/15  Yes Debbe Odea, MD  losartan (COZAAR) 50 MG tablet Take 1 tablet (50 mg total) by mouth every evening. 09/13/15  Yes [name], MD  multivitamin (RENA-VIT) TABS tablet Take 1 tablet by mouth at bedtime. 06/09/15  Yes Debbe Odea, MD  pantoprazole (PROTONIX) 20 MG tablet Take 1 tablet (20 mg total) by mouth daily.  03/24/16  Yes Orpah Greek, MD  tacrolimus (PROGRAF) 1 MG capsule Take 1 mg by mouth 2 (two) times daily.    Yes Historical Provider, MD  ondansetron (ZOFRAN ODT) 8 MG disintegrating tablet Take 1 tablet (8 mg total) by mouth every 8 (eight) hours as needed for nausea or vomiting. 03/24/16   Jola Schmidt, MD    Family History Family History  Problem Relation Age of Onset  . Family history unknown: Yes    Social History Social History  Substance Use Topics  . Smoking status: Never Smoker  . Smokeless tobacco: Never Used  . Alcohol use No     Allergies   Review of patient's allergies indicates no known allergies.   Review of Systems Review of Systems  All other systems reviewed and are  negative.    Physical Exam Updated Vital Signs BP (!) 184/121   Pulse 80   Temp 97.9 F (36.6 C) (Oral)   Resp 19   Ht 5' (1.524 m)   Wt 125 lb (56.7 kg)   LMP 01/27/2016 (Approximate)   SpO2 100%   BMI 24.41 kg/m   Physical Exam  Constitutional: She is oriented to person, place, and time. She appears well-developed and well-nourished. No distress.  HENT:  Head: Normocephalic and atraumatic.  Eyes: EOM are normal.  Neck: Normal range of motion.  Cardiovascular: Normal rate, regular rhythm and normal heart sounds.   Pulmonary/Chest: Effort normal and breath sounds normal.  Abdominal: Soft. She exhibits no distension. There is no tenderness.  Musculoskeletal: Normal range of motion.  Neurological: She is alert and oriented to person, place, and time.  Skin: Skin is warm and dry.  Psychiatric: She has a normal mood and affect. Judgment normal.  Nursing note and vitals reviewed.    ED Treatments / Results  Labs (all labs ordered are listed, but only abnormal results are displayed) Labs Reviewed  CBC WITH DIFFERENTIAL/PLATELET - Abnormal; Notable for the following:       Result Value   WBC 10.7 (*)    RDW 16.0 (*)    Neutro Abs 8.4 (*)    All other components within normal limits  COMPREHENSIVE METABOLIC PANEL - Abnormal; Notable for the following:    Chloride 95 (*)    BUN 51 (*)    Creatinine, Ser 12.08 (*)    ALT 11 (*)    GFR calc non Af Amer 4 (*)    GFR calc Af Amer 4 (*)    All other components within normal limits  LIPASE, BLOOD    EKG  EKG Interpretation None       Radiology Ct Abdomen Pelvis W Contrast  Result Date: 03/24/2016 CLINICAL DATA:  Patient c/o epigastric pain that began today. Patient states that was nauseas and has had a small amount of diarrhea today. Patient now complains of sharp pain in her abdomen. EXAM: CT ABDOMEN AND PELVIS WITH CONTRAST TECHNIQUE: Multidetector CT imaging of the abdomen and pelvis was performed using the  standard protocol following bolus administration of intravenous contrast. CONTRAST:  16mL ISOVUE-300 IOPAMIDOL (ISOVUE-300) INJECTION 61% COMPARISON:  CT abdomen dated 06/08/2015. FINDINGS: Lower chest: No acute abnormality. Hepatobiliary: No focal liver abnormality is seen. No gallstones, gallbladder wall thickening, or biliary dilatation. Pancreas: Unremarkable. No pancreatic ductal dilatation or surrounding inflammatory changes. Spleen: Normal in size without focal abnormality. Adrenals/Urinary Tract: Native kidneys are atrophic bilaterally. The 2 right lower quadrant transplant kidneys now appear heavily calcified and likely atrophic, not well seen.  Stomach/Bowel: There is extensive thickening of the walls of the small bowel in the upper abdomen and mid abdomen. Small bowel within the lower abdomen and pelvis has a relatively normal appearance. Large bowel is unremarkable. No dilated large or small bowel loops. Vascular/Lymphatic: No significant vascular findings are present. No enlarged abdominal or pelvic lymph nodes. Reproductive: Uterus and bilateral adnexa are unremarkable. Other: Moderate amount of free fluid throughout the abdomen and pelvis. No circumscribed fluid collection or abscess collection seen. Musculoskeletal: No acute or suspicious osseous lesion. Superficial soft tissues are unremarkable. IMPRESSION: 1. Prominent thickening of the walls of the small bowel throughout the upper abdomen and mid abdomen. Given the history of transplanted kidneys, this could represent post transplant lymphoproliferative disorder. Alternatively, findings could represent an acute enteritis of infectious or inflammatory nature. 2. Transplant kidneys within the right lower quadrant now appear heavily calcified, presumably atrophic, not well seen. Native kidneys are atrophic, similar in appearance to the previous exam. 3. Moderate amount of free fluid throughout the abdomen and pelvis. No circumscribed fluid collection  or abscess collection seen. No free intraperitoneal air. These results were called by telephone at the time of interpretation on 03/24/2016 at 5:37 pm to Dr. Jola Schmidt , who verbally acknowledged these results. Electronically Signed   By: Franki Cabot M.D.   On: 03/24/2016 17:37    Procedures Procedures (including critical care time)  Medications Ordered in ED Medications  ondansetron (ZOFRAN) injection 4 mg (4 mg Intravenous Given 03/24/16 1539)  morphine 4 MG/ML injection 6 mg (6 mg Intravenous Given 03/24/16 1539)  iopamidol (ISOVUE-300) 61 % injection 75 mL (75 mLs Intravenous Contrast Given 03/24/16 1711)     Initial Impression / Assessment and Plan / ED Course  I have reviewed the triage vital signs and the nursing notes.  Pertinent labs & imaging results that were available during my care of the patient were reviewed by me and considered in my medical decision making (see chart for details).  Clinical Course    I discussed the case with radiology.  This is likely enteritis with secondary inflammatory peritoneal fluid.  This could also represent lymphoproliferative disease.  She will definitely need additional workup if her pain persists.  I long discussion with the patient with the patients significant other in the room and they understand the importance of close follow-up with her nephrologist.  At this time I do not think she needs hospitalization.  She feels much better at this time.  Patient be discharged home with antinausea medications.  She understands return to the ER for new or worsening symptoms.  I discussed the findings of the CT imaging with them  Final Clinical Impressions(s) / ED Diagnoses   Final diagnoses:  Nausea and vomiting, intractability of vomiting not specified, unspecified vomiting type  Generalized abdominal pain    New Prescriptions New Prescriptions   ONDANSETRON (ZOFRAN ODT) 8 MG DISINTEGRATING TABLET    Take 1 tablet (8 mg total) by mouth every 8  (eight) hours as needed for nausea or vomiting.     Jola Schmidt, MD 03/24/16 [phone]

## 2016-03-24 NOTE — ED Triage Notes (Signed)
Pt was seen in the ED last night and states that Mx given is not working. Pt says that what ever she got though the IV worked but the pills do not work. Pt c/o n/v. Pt has thrown up 3 times today, watery consistency.

## 2016-03-24 NOTE — ED Provider Notes (Signed)
[facility] DEPT Provider Note   CSN: [id number] Arrival date & time: 03/23/16  2343  By signing my name below, I, Royce Macadamia, attest that this documentation has been prepared under the direction and in the presence of Orpah Greek, MD . Electronically Signed: Royce Macadamia, Scribe. 03/24/2016. 12:30 AM.  History   Chief Complaint Chief Complaint  Patient presents with  . Abdominal Pain   The history is provided by the patient and medical records. No language interpreter was used.    HPI Comments:  Kerry Cook is a 30 y.o. female with a history of end state renal disease who presents to the Emergency Department complaining of upper medial abdominal pain onset this morning.  She also notes associated nausea and diarrhea.  She states she has never had abdominal pain in this locations that was this intense.  She wasn't able to take her medications today because she can't eat anything and didn't want to make her pain worse.  She denies lower and lateral abdominal pain and vomiting.  She also denies history of acid reflux and peptic ulcers.  Pt has dialysis MWF; she no longer produces urine.    1:52 AM Pt states her pain is improved.    Past Medical History:  Diagnosis Date  . Chronic kidney disease    previous hx dialysis  . Dialysis patient ([place])   . History of kidney transplant 2012   kidney failure due to hypertension  . Hypertension     Patient Active Problem List   Diagnosis Date Noted  . Medicare annual wellness visit, initial 10/31/2015  . ESRD (end stage renal disease) on dialysis ([place])   . Cephalalgia   . History of renal transplant   . Headache 06/07/2015  . Hypertensive urgency 02/16/2015  . Absolute anemia   . SOB (shortness of breath) 02/01/2015  . Hypertension   . History of kidney transplant   . Pneumonia 01/23/2015  . Tachycardia   . Symptomatic anemia 01/20/2015  . Supervision of high risk pregnancy, antepartum 06/02/2014  .  Anemia in chronic kidney disease 10/01/2007  . THROMBOCYTOPENIA 10/01/2007  . End stage renal disease on dialysis ([place]) 10/01/2007  . PAP SMEAR, LGSIL, ABNORMAL 10/01/2007  . HYPERTENSION, BENIGN SYSTEMIC 08/21/2006  . PROTEINURIA 08/21/2006    Past Surgical History:  Procedure Laterality Date  . KIDNEY TRANSPLANT Bilateral 2012    OB History    Gravida Para Term Preterm AB Living   1       1 0   SAB TAB Ectopic Multiple Live Births   1               Home Medications    Prior to Admission medications   Medication Sig Start Date End Date Taking? Authorizing Provider  amLODipine (NORVASC) 10 MG tablet Take 1 tablet (10 mg total) by mouth every evening. 09/13/15  Yes [name], MD  calcium acetate (PHOSLO) 667 MG capsule Take 2,001 mg by mouth 3 (three) times daily with meals. 05/29/15  Yes Historical Provider, MD  cloNIDine (CATAPRES) 0.1 MG tablet Take 0.1 mg by mouth 2 (two) times daily.   Yes Historical Provider, MD  [employer] PAIN RELIEF EXTRA STRENGTH 500 MG tablet TAKE 1 TABLET (500 MG TOTAL) BY MOUTH EVERY 6 (SIX) HOURS AS NEEDED. 09/14/15  Yes [name], FNP  hydrALAZINE (APRESOLINE) 50 MG tablet Take 1 tablet (50 mg total) by mouth 2 (two) times daily. 09/13/15  Yes [name], MD  labetalol (NORMODYNE)  200 MG tablet Take 1 tablet (200 mg total) by mouth 2 (two) times daily. 06/08/15  Yes Debbe Odea, MD  losartan (COZAAR) 50 MG tablet Take 1 tablet (50 mg total) by mouth every evening. 09/13/15  Yes [name], MD  multivitamin (RENA-VIT) TABS tablet Take 1 tablet by mouth at bedtime. 06/09/15  Yes Debbe Odea, MD  tacrolimus (PROGRAF) 1 MG capsule Take 1 mg by mouth 2 (two) times daily.    Yes Historical Provider, MD  dicyclomine (BENTYL) 20 MG tablet Take 1 tablet (20 mg total) by mouth 2 (two) times daily. 03/24/16   Orpah Greek, MD  pantoprazole (PROTONIX) 20 MG tablet Take 1 tablet (20 mg total) by mouth daily. 03/24/16   Orpah Greek, MD    Family History Family History  Problem Relation Age of Onset  . Family history unknown: Yes    Social History Social History  Substance Use Topics  . Smoking status: Never Smoker  . Smokeless tobacco: Never Used  . Alcohol use No     Allergies   Review of patient's allergies indicates no known allergies.   Review of Systems Review of Systems  Constitutional: Negative for fever.  Gastrointestinal: Positive for abdominal pain, diarrhea and nausea. Negative for vomiting.     Physical Exam Updated Vital Signs BP (!) 195/131   Pulse 94   Temp 98 F (36.7 C) (Oral)   Resp 19   Ht 5' (1.524 m)   Wt 125 lb (56.7 kg)   LMP 01/27/2016 (Approximate)   SpO2 96%   BMI 24.41 kg/m   Physical Exam  Constitutional: She is oriented to person, place, and time. She appears well-developed and well-nourished. No distress.  HENT:  Head: Normocephalic and atraumatic.  Right Ear: Hearing normal.  Left Ear: Hearing normal.  Nose: Nose normal.  Mouth/Throat: Oropharynx is clear and moist and mucous membranes are normal.  Eyes: Conjunctivae and EOM are normal. Pupils are equal, round, and reactive to light.  Neck: Normal range of motion. Neck supple.  Cardiovascular: Regular rhythm, S1 normal and S2 normal.  Exam reveals no gallop and no friction rub.   No murmur heard. Pulmonary/Chest: Effort normal and breath sounds normal. No respiratory distress. She exhibits no tenderness.  Abdominal: Soft. Normal appearance and bowel sounds are normal. There is no hepatosplenomegaly. There is tenderness. There is no rebound, no guarding, no tenderness at McBurney's point and negative Murphy's sign. No hernia.  Epigastric tenderness  Musculoskeletal: Normal range of motion.  Neurological: She is alert and oriented to person, place, and time. She has normal strength. No cranial nerve deficit or sensory deficit. Coordination normal. GCS eye subscore is 4. GCS verbal subscore is 5. GCS  motor subscore is 6.  Skin: Skin is warm, dry and intact. No rash noted. No cyanosis.  Psychiatric: She has a normal mood and affect. Her speech is normal and behavior is normal. Thought content normal.  Nursing note and vitals reviewed.    ED Treatments / Results   DIAGNOSTIC STUDIES:  Oxygen Saturation is 100% on RA, NML by my interpretation.    COORDINATION OF CARE:  12:30 AM Discussed treatment plan with pt at bedside and pt agreed to plan.  Labs (all labs ordered are listed, but only abnormal results are displayed) Labs Reviewed  COMPREHENSIVE METABOLIC PANEL - Abnormal; Notable for the following:       Result Value   Chloride 95 (*)    BUN 40 (*)  Creatinine, Ser 9.96 (*)    ALT 11 (*)    GFR calc non Af Amer 5 (*)    GFR calc Af Amer 5 (*)    Anion gap 16 (*)    All other components within normal limits  CBC - Abnormal; Notable for the following:    WBC 12.7 (*)    RDW 16.0 (*)    All other components within normal limits  LIPASE, BLOOD  I-STAT BETA HCG BLOOD, ED ([facility], [facility], [facility] ONLY)    EKG  EKG Interpretation None       Radiology No results found.  Procedures Procedures (including critical care time)  Medications Ordered in ED Medications  HYDROmorphone (DILAUDID) injection 1 mg (1 mg Intravenous Given 03/24/16 0035)  ondansetron (ZOFRAN) injection 4 mg (4 mg Intravenous Given 03/24/16 0028)  famotidine (PEPCID) IVPB 20 mg premix (0 mg Intravenous Stopped 03/24/16 0100)  cloNIDine (CATAPRES) tablet 0.2 mg (0.2 mg Oral Given 03/24/16 0033)     Initial Impression / Assessment and Plan / ED Course  I have reviewed the triage vital signs and the nursing notes.  Pertinent labs & imaging results that were available during my care of the patient were reviewed by me and considered in my medical decision making (see chart for details).  Clinical Course    Presents to the emergency part for evaluation of abdominal pain. Patient did have diarrhea earlier.  There was nausea without vomiting. At arrival patient had mild tenderness in the upper abdomen. Her labs are unremarkable. Patient administered medications and recheck revealed that she was completely pain-free. There is no longer any tenderness, abdomen is completely benign. She does not require any imaging at this point, counseled to return if she has any worsening of her pain.  Patient hypertensive at arrival. She has a history of chronic hypertension that has been refractory to treatment. She has not taken her medicines today. She was given clonidine here and will take the remainder of her medications at home. She is asymptomatic at this time.    Clinical Impressions(s) / ED Diagnoses   Final diagnoses:  Epigastric pain  Essential hypertension    New Prescriptions New Prescriptions   DICYCLOMINE (BENTYL) 20 MG TABLET    Take 1 tablet (20 mg total) by mouth 2 (two) times daily.   PANTOPRAZOLE (PROTONIX) 20 MG TABLET    Take 1 tablet (20 mg total) by mouth daily.   I personally performed the services described in this documentation, which was scribed in my presence. The recorded information has been reviewed and is accurate.     Orpah Greek, MD 03/24/16 [phone]

## 2016-03-25 DIAGNOSIS — D631 Anemia in chronic kidney disease: Secondary | ICD-10-CM | POA: Diagnosis not present

## 2016-03-25 DIAGNOSIS — N186 End stage renal disease: Secondary | ICD-10-CM | POA: Diagnosis not present

## 2016-03-25 DIAGNOSIS — N2581 Secondary hyperparathyroidism of renal origin: Secondary | ICD-10-CM | POA: Diagnosis not present

## 2016-03-27 DIAGNOSIS — N2581 Secondary hyperparathyroidism of renal origin: Secondary | ICD-10-CM | POA: Diagnosis not present

## 2016-03-27 DIAGNOSIS — D631 Anemia in chronic kidney disease: Secondary | ICD-10-CM | POA: Diagnosis not present

## 2016-03-27 DIAGNOSIS — N186 End stage renal disease: Secondary | ICD-10-CM | POA: Diagnosis not present

## 2016-03-29 DIAGNOSIS — N2581 Secondary hyperparathyroidism of renal origin: Secondary | ICD-10-CM | POA: Diagnosis not present

## 2016-03-29 DIAGNOSIS — D631 Anemia in chronic kidney disease: Secondary | ICD-10-CM | POA: Diagnosis not present

## 2016-03-29 DIAGNOSIS — N186 End stage renal disease: Secondary | ICD-10-CM | POA: Diagnosis not present

## 2016-03-31 ENCOUNTER — Encounter (HOSPITAL_COMMUNITY): Payer: Self-pay | Admitting: Emergency Medicine

## 2016-03-31 ENCOUNTER — Inpatient Hospital Stay (HOSPITAL_COMMUNITY)
Admission: EM | Admit: 2016-03-31 | Discharge: 2016-04-02 | DRG: 380 | Disposition: A | Discharge status: Home / Self Care | Payer: Medicare Other | Attending: Internal Medicine | Admitting: Internal Medicine

## 2016-03-31 ENCOUNTER — Emergency Department (HOSPITAL_COMMUNITY): Payer: Medicare Other

## 2016-03-31 DIAGNOSIS — D696 Thrombocytopenia, unspecified: Secondary | ICD-10-CM | POA: Diagnosis not present

## 2016-03-31 DIAGNOSIS — R933 Abnormal findings on diagnostic imaging of other parts of digestive tract: Secondary | ICD-10-CM

## 2016-03-31 DIAGNOSIS — D631 Anemia in chronic kidney disease: Secondary | ICD-10-CM | POA: Diagnosis present

## 2016-03-31 DIAGNOSIS — E8889 Other specified metabolic disorders: Secondary | ICD-10-CM | POA: Diagnosis present

## 2016-03-31 DIAGNOSIS — K259 Gastric ulcer, unspecified as acute or chronic, without hemorrhage or perforation: Secondary | ICD-10-CM | POA: Diagnosis not present

## 2016-03-31 DIAGNOSIS — Z992 Dependence on renal dialysis: Secondary | ICD-10-CM | POA: Diagnosis not present

## 2016-03-31 DIAGNOSIS — K3189 Other diseases of stomach and duodenum: Secondary | ICD-10-CM | POA: Diagnosis not present

## 2016-03-31 DIAGNOSIS — N186 End stage renal disease: Secondary | ICD-10-CM | POA: Diagnosis not present

## 2016-03-31 DIAGNOSIS — T8612 Kidney transplant failure: Secondary | ICD-10-CM | POA: Diagnosis present

## 2016-03-31 DIAGNOSIS — K311 Adult hypertrophic pyloric stenosis: Secondary | ICD-10-CM | POA: Diagnosis not present

## 2016-03-31 DIAGNOSIS — Z79899 Other long term (current) drug therapy: Secondary | ICD-10-CM | POA: Diagnosis not present

## 2016-03-31 DIAGNOSIS — D649 Anemia, unspecified: Secondary | ICD-10-CM | POA: Diagnosis not present

## 2016-03-31 DIAGNOSIS — R1010 Upper abdominal pain, unspecified: Secondary | ICD-10-CM | POA: Diagnosis not present

## 2016-03-31 DIAGNOSIS — I10 Essential (primary) hypertension: Secondary | ICD-10-CM

## 2016-03-31 DIAGNOSIS — Y830 Surgical operation with transplant of whole organ as the cause of abnormal reaction of the patient, or of later complication, without mention of misadventure at the time of the procedure: Secondary | ICD-10-CM | POA: Diagnosis present

## 2016-03-31 DIAGNOSIS — R51 Headache: Secondary | ICD-10-CM | POA: Diagnosis not present

## 2016-03-31 DIAGNOSIS — I120 Hypertensive chronic kidney disease with stage 5 chronic kidney disease or end stage renal disease: Secondary | ICD-10-CM | POA: Diagnosis present

## 2016-03-31 DIAGNOSIS — R109 Unspecified abdominal pain: Secondary | ICD-10-CM | POA: Diagnosis not present

## 2016-03-31 DIAGNOSIS — N2581 Secondary hyperparathyroidism of renal origin: Secondary | ICD-10-CM | POA: Diagnosis present

## 2016-03-31 DIAGNOSIS — K2950 Unspecified chronic gastritis without bleeding: Secondary | ICD-10-CM | POA: Diagnosis not present

## 2016-03-31 LAB — COMPREHENSIVE METABOLIC PANEL
ALT: 21 U/L (ref 14–54)
AST: 29 U/L (ref 15–41)
Albumin: 3.8 g/dL (ref 3.5–5.0)
Alkaline Phosphatase: 64 U/L (ref 38–126)
Anion gap: 17 — ABNORMAL HIGH (ref 5–15)
BUN: 49 mg/dL — ABNORMAL HIGH (ref 6–20)
CHLORIDE: 94 mmol/L — AB (ref 101–111)
CO2: 27 mmol/L (ref 22–32)
CREATININE: 12.62 mg/dL — AB (ref 0.44–1.00)
Calcium: 10 mg/dL (ref 8.9–10.3)
GFR calc non Af Amer: 4 mL/min — ABNORMAL LOW (ref >60)
GFR, EST AFRICAN AMERICAN: 4 mL/min — AB (ref >60)
Glucose, Bld: 76 mg/dL (ref 65–99)
POTASSIUM: 4.6 mmol/L (ref 3.5–5.1)
SODIUM: 138 mmol/L (ref 135–145)
Total Bilirubin: 1.2 mg/dL (ref 0.3–1.2)
Total Protein: 7 g/dL (ref 6.5–8.1)

## 2016-03-31 LAB — CBC
HEMATOCRIT: 33.7 % — AB (ref 36.0–46.0)
HEMOGLOBIN: 11.4 g/dL — AB (ref 12.0–15.0)
MCH: 31.5 pg (ref 26.0–34.0)
MCHC: 33.8 g/dL (ref 30.0–36.0)
MCV: 93.1 fL (ref 78.0–100.0)
PLATELETS: 160 10*3/uL (ref 150–400)
RBC: 3.62 MIL/uL — AB (ref 3.87–5.11)
RDW: 15.3 % (ref 11.5–15.5)
WBC: 12 10*3/uL — AB (ref 4.0–10.5)

## 2016-03-31 LAB — HCG, QUANTITATIVE, PREGNANCY: hCG, Beta Chain, Quant, S: 1 m[IU]/mL (ref <5)

## 2016-03-31 LAB — LIPASE, BLOOD: LIPASE: 24 U/L (ref 11–51)

## 2016-03-31 MED ORDER — ONDANSETRON HCL 4 MG/2ML IJ SOLN
4.0000 mg | Freq: Once | INTRAMUSCULAR | Status: AC
  Administered 2016-03-31: 4 mg via INTRAVENOUS
  Filled 2016-03-31: qty 2

## 2016-03-31 MED ORDER — MORPHINE SULFATE (PF) 2 MG/ML IV SOLN
2.0000 mg | INTRAVENOUS | Status: DC | PRN
  Administered 2016-04-01 (×2): 2 mg via INTRAVENOUS
  Filled 2016-03-31: qty 1

## 2016-03-31 MED ORDER — CLONIDINE HCL 0.1 MG PO TABS
0.1000 mg | ORAL_TABLET | Freq: Two times a day (BID) | ORAL | Status: DC
  Administered 2016-03-31 – 2016-04-02 (×4): 0.1 mg via ORAL
  Filled 2016-03-31 (×4): qty 1

## 2016-03-31 MED ORDER — LOSARTAN POTASSIUM 50 MG PO TABS
50.0000 mg | ORAL_TABLET | Freq: Every evening | ORAL | Status: DC
  Administered 2016-03-31 – 2016-04-01 (×2): 50 mg via ORAL
  Filled 2016-03-31 (×2): qty 1

## 2016-03-31 MED ORDER — LABETALOL HCL 200 MG PO TABS: 200.0000 mg | ORAL_TABLET | Freq: Two times a day (BID) | ORAL | Status: DC

## 2016-03-31 MED ORDER — IOPAMIDOL (ISOVUE-300) INJECTION 61%
100.0000 mL | Freq: Once | INTRAVENOUS | Status: AC | PRN
  Administered 2016-03-31: 80 mL via INTRAVENOUS

## 2016-03-31 MED ORDER — IOPAMIDOL (ISOVUE-300) INJECTION 61%
30.0000 mL | Freq: Once | INTRAVENOUS | Status: AC | PRN
  Administered 2016-03-31: 30 mL via ORAL

## 2016-03-31 MED ORDER — SODIUM CHLORIDE 0.9 % IV SOLN
INTRAVENOUS | Status: DC
  Administered 2016-03-31: via INTRAVENOUS

## 2016-03-31 MED ORDER — TACROLIMUS 1 MG PO CAPS
1.0000 mg | ORAL_CAPSULE | Freq: Two times a day (BID) | ORAL | Status: DC
  Administered 2016-03-31 – 2016-04-01 (×2): 1 mg via ORAL
  Filled 2016-03-31 (×2): qty 1

## 2016-03-31 MED ORDER — LABETALOL HCL 200 MG PO TABS
200.0000 mg | ORAL_TABLET | Freq: Once | ORAL | Status: AC
  Administered 2016-03-31: 200 mg via ORAL
  Filled 2016-03-31: qty 1

## 2016-03-31 MED ORDER — LABETALOL HCL 200 MG PO TABS
200.0000 mg | ORAL_TABLET | Freq: Two times a day (BID) | ORAL | Status: DC
  Administered 2016-04-01 – 2016-04-02 (×3): 200 mg via ORAL
  Filled 2016-03-31 (×3): qty 1

## 2016-03-31 MED ORDER — RENA-VITE PO TABS
1.0000 | ORAL_TABLET | Freq: Every day | ORAL | Status: DC
  Administered 2016-03-31 – 2016-04-01 (×2): 1 via ORAL
  Filled 2016-03-31 (×2): qty 1

## 2016-03-31 MED ORDER — ONDANSETRON 8 MG PO TBDP: 8.0000 mg | ORAL_TABLET | Freq: Three times a day (TID) | ORAL | Status: DC | PRN

## 2016-03-31 MED ORDER — HEPARIN SODIUM (PORCINE) 5000 UNIT/ML IJ SOLN
5000.0000 [IU] | Freq: Three times a day (TID) | INTRAMUSCULAR | Status: DC
  Administered 2016-03-31 – 2016-04-01 (×3): 5000 [IU] via SUBCUTANEOUS
  Filled 2016-03-31 (×4): qty 1

## 2016-03-31 MED ORDER — ONDANSETRON HCL 4 MG PO TABS: 4.0000 mg | ORAL_TABLET | Freq: Four times a day (QID) | ORAL | Status: DC | PRN

## 2016-03-31 MED ORDER — AMLODIPINE BESYLATE 10 MG PO TABS
10.0000 mg | ORAL_TABLET | Freq: Every day | ORAL | Status: DC
  Administered 2016-04-01 – 2016-04-02 (×2): 10 mg via ORAL
  Filled 2016-03-31 (×2): qty 1

## 2016-03-31 MED ORDER — MORPHINE SULFATE (PF) 4 MG/ML IV SOLN
4.0000 mg | Freq: Once | INTRAVENOUS | Status: AC
  Administered 2016-03-31: 4 mg via INTRAVENOUS
  Filled 2016-03-31: qty 1

## 2016-03-31 MED ORDER — ONDANSETRON HCL 4 MG/2ML IJ SOLN: 4.0000 mg | Freq: Four times a day (QID) | INTRAMUSCULAR | Status: DC | PRN

## 2016-03-31 MED ORDER — CALCIUM ACETATE (PHOS BINDER) 667 MG PO CAPS
2001.0000 mg | ORAL_CAPSULE | Freq: Three times a day (TID) | ORAL | Status: DC
  Administered 2016-04-02: 2001 mg via ORAL
  Filled 2016-03-31 (×3): qty 3

## 2016-03-31 MED ORDER — PANTOPRAZOLE SODIUM 40 MG PO TBEC
40.0000 mg | DELAYED_RELEASE_TABLET | Freq: Two times a day (BID) | ORAL | Status: DC
  Administered 2016-03-31 – 2016-04-02 (×4): 40 mg via ORAL
  Filled 2016-03-31 (×4): qty 1

## 2016-03-31 NOTE — ED Notes (Signed)
Patient transported to CT 

## 2016-03-31 NOTE — ED Notes (Signed)
Pt would like labs/IV done at same time.  RN aware.

## 2016-03-31 NOTE — ED Notes (Signed)
Bed: PR94 Expected date:  Expected time:  Means of arrival:  Comments: Triage

## 2016-03-31 NOTE — ED Notes (Signed)
Pt unable to provide urine sample.

## 2016-03-31 NOTE — H&P (Addendum)
History and Physical    Kerry Cook:[id number] DOB: August 16, 1985 DOA: 03/31/2016   PCP: [name], FNP Chief Complaint:  Chief Complaint  Patient presents with  . Abdominal Pain    HPI: Kerry Cook is a 30 y.o. female with medical history significant of ESRD, failed renal transplant.  ESRD in setting of HTN.  Patient presents to the ED with c/o upper abdominal pain.  This onset a couple of weeks ago but has been gradually worsening.  She was seen in the ED on the 1st of the month and had CT abd/pelvis done at that time which showed inflammation of duodenum.  Her pain had resolved over the past week until it recurred this morning.  One does of home pain meds didn't really help pain.  ED Course: Pain relieved with morphine, repeat CT scan now shows worsening inflammation of duodenum, to the point of causing gastric outlet obstruction which is now new.  There is concern for a post-transplant lymphoproliferative disorder.  Review of Systems: As per HPI otherwise 10 point review of systems negative.    Past Medical History:  Diagnosis Date  . Chronic kidney disease    previous hx dialysis  . Dialysis patient ([place])   . History of kidney transplant 2012   kidney failure due to hypertension  . Hypertension     Past Surgical History:  Procedure Laterality Date  . KIDNEY TRANSPLANT Bilateral 2012     reports that she has never smoked. She has never used smokeless tobacco. She reports that she does not drink alcohol or use drugs.  No Known Allergies  Family History  Problem Relation Age of Onset  . Family history unknown: Yes      Prior to Admission medications   Medication Sig Start Date End Date Taking? Authorizing Provider  amLODipine (NORVASC) 10 MG tablet Take 1 tablet (10 mg total) by mouth every evening. Patient taking differently: Take 10 mg by mouth every morning.  09/13/15  Yes [name], MD  calcium acetate (PHOSLO) 667 MG capsule Take  2,001 mg by mouth 3 (three) times daily with meals. 05/29/15  Yes Historical Provider, MD  cloNIDine (CATAPRES) 0.1 MG tablet Take 0.1 mg by mouth 2 (two) times daily.   Yes Historical Provider, MD  labetalol (NORMODYNE) 200 MG tablet Take 1 tablet (200 mg total) by mouth 2 (two) times daily. 06/08/15  Yes Debbe Odea, MD  losartan (COZAAR) 50 MG tablet Take 1 tablet (50 mg total) by mouth every evening. 09/13/15  Yes [name], MD  multivitamin (RENA-VIT) TABS tablet Take 1 tablet by mouth at bedtime. 06/09/15  Yes Debbe Odea, MD  ondansetron (ZOFRAN ODT) 8 MG disintegrating tablet Take 1 tablet (8 mg total) by mouth every 8 (eight) hours as needed for nausea or vomiting. 03/24/16  Yes Jola Schmidt, MD  tacrolimus (PROGRAF) 1 MG capsule Take 1 mg by mouth 2 (two) times daily.    Yes Historical Provider, MD    Physical Exam: Vitals:   03/31/16 1652 03/31/16 1757 03/31/16 1911 03/31/16 2142  BP: (!) 188/123 (!) 199/135 (!) 195/133 (!) 197/121  Pulse:  113 96 85  Resp:   16 12  Temp:      TempSrc:      SpO2:   98% 99%      Constitutional: NAD, calm, comfortable Eyes: PERRL, lids and conjunctivae normal ENMT: Mucous membranes are moist. Posterior pharynx clear of any exudate or lesions.Normal dentition.  Neck: normal, supple, no masses,  no thyromegaly Respiratory: clear to auscultation bilaterally, no wheezing, no crackles. Normal respiratory effort. No accessory muscle use.  Cardiovascular: Regular rate and rhythm, no murmurs / rubs / gallops. No extremity edema. 2+ pedal pulses. No carotid bruits.  Abdomen: no tenderness, no masses palpated. No hepatosplenomegaly. Bowel sounds positive.  Musculoskeletal: no clubbing / cyanosis. No joint deformity upper and lower extremities. Good ROM, no contractures. Normal muscle tone.  Skin: no rashes, lesions, ulcers. No induration Neurologic: CN 2-12 grossly intact. Sensation intact, DTR normal. Strength 5/5 in all 4.  Psychiatric: Normal  judgment and insight. Alert and oriented x 3. Normal mood.    Labs on Admission: I have personally reviewed following labs and imaging studies  CBC:  Recent Labs Lab 03/31/16 1751  WBC 12.0*  HGB 11.4*  HCT 33.7*  MCV 93.1  PLT 825   Basic Metabolic Panel:  Recent Labs Lab 03/31/16 1751  NA 138  K 4.6  CL 94*  CO2 27  GLUCOSE 76  BUN 49*  CREATININE 12.62*  CALCIUM 10.0   GFR: Estimated Creatinine Clearance: 5.2 mL/min (by C-G formula based on SCr of 12.62 mg/dL (H)). Liver Function Tests:  Recent Labs Lab 03/31/16 1751  AST 29  ALT 21  ALKPHOS 64  BILITOT 1.2  PROT 7.0  ALBUMIN 3.8    Recent Labs Lab 03/31/16 1751  LIPASE 24   No results for input(s): AMMONIA in the last 168 hours. Coagulation Profile: No results for input(s): INR, PROTIME in the last 168 hours. Cardiac Enzymes: No results for input(s): CKTOTAL, CKMB, CKMBINDEX, TROPONINI in the last 168 hours. BNP (last 3 results) No results for input(s): PROBNP in the last 8760 hours. HbA1C: No results for input(s): HGBA1C in the last 72 hours. CBG: No results for input(s): GLUCAP in the last 168 hours. Lipid Profile: No results for input(s): CHOL, HDL, LDLCALC, TRIG, CHOLHDL, LDLDIRECT in the last 72 hours. Thyroid Function Tests: No results for input(s): TSH, T4TOTAL, FREET4, T3FREE, THYROIDAB in the last 72 hours. Anemia Panel: No results for input(s): VITAMINB12, FOLATE, FERRITIN, TIBC, IRON, RETICCTPCT in the last 72 hours. Urine analysis:    Component Value Date/Time   COLORURINE YELLOW 01/21/2015 1343   APPEARANCEUR HAZY (A) 01/21/2015 1343   LABSPEC 1.009 01/21/2015 1343   PHURINE 8.5 (H) 01/21/2015 1343   GLUCOSEU 100 (A) 01/21/2015 1343   HGBUR SMALL (A) 01/21/2015 1343   BILIRUBINUR NEGATIVE 01/21/2015 1343   KETONESUR 15 (A) 01/21/2015 1343   PROTEINUR >300 (A) 01/21/2015 1343   UROBILINOGEN 0.2 01/21/2015 1343   NITRITE NEGATIVE 01/21/2015 1343   LEUKOCYTESUR NEGATIVE  01/21/2015 1343   Sepsis Labs: @LABRCNTIP (procalcitonin:4,lacticidven:4) )No results found for this or any previous visit (from the past 240 hour(s)).   Radiological Exams on Admission: Ct Abdomen Pelvis W Contrast  Result Date: 03/31/2016 CLINICAL DATA:  Upper abdominal pain and loss of appetite. EXAM: CT ABDOMEN AND PELVIS WITH CONTRAST TECHNIQUE: Multidetector CT imaging of the abdomen and pelvis was performed using the standard protocol following bolus administration of intravenous contrast. CONTRAST:  21mL ISOVUE-300 IOPAMIDOL (ISOVUE-300) INJECTION 61%, 49mL ISOVUE-300 IOPAMIDOL (ISOVUE-300) INJECTION 61% COMPARISON:  CT of the abdomen 10 06/2015 FINDINGS: Lower chest: No acute abnormality. Hepatobiliary: No focal liver abnormality is seen. No gallstones, gallbladder wall thickening, or biliary dilatation. Pancreas: Unremarkable. No pancreatic ductal dilatation or surrounding inflammatory changes. Spleen: Normal in size without focal abnormality. Adrenals/Urinary Tract: Atrophic native kidneys. 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 Stomach/Bowel: The stomach is significantly  distended. Mild irregular thickening of the pylorus is seen. The duodenum is normal in caliber. There is persistent diffuse mucosal thickening of multiple proximal small bowel loops with increased inflammatory mesenteric stranding and moderate amount of free fluid in the abdomen and pelvis. The more distal small bowel loops have normal appearance. The colon has normal appearance. No evidence of small-bowel obstruction. Vascular/Lymphatic: No significant vascular findings are present. No enlarged abdominal or pelvic lymph nodes. Reproductive: Uterus and bilateral adnexa are unremarkable. Other: No abdominal wall hernia or abnormality. No abdominopelvic ascites. Musculoskeletal: No acute or significant osseous findings. IMPRESSION: Persistent prominent mucosal thickening of the proximal small bowel with increased  inflammatory mesenteric stranding and moderate amount of free fluid in the abdomen and pelvis. Consideration of posttransplant lymphoproliferative disorder versus acute infectious or inflammatory enteritis persists. Interval development of moderate gastric distention with irregular thickening of the pylorus, likely causing partial gastric outlet obstruction, and likely related to the same pathologic process. 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 Electronically Signed   By: Fidela Salisbury M.D.   On: 03/31/2016 21:51    EKG: Independently reviewed.  Assessment/Plan Principal Problem:   Gastric outlet obstruction Active Problems:   End stage renal disease on dialysis ([place])   Hypertension    1. Gastric outlet obstruction - recurrent / worsening inflammation of duodenum demonstrated over past week on CT scan, symptoms ongoing for past 2 weeks.  DDx includes pyloric / duodenal ulcer disease, post-transplant lymphoproliferative disorder 1. NPO except meds 2. IVF 3. Holding off on NGT for the moment given no vomiting and improvement in pain, if symptoms worsen she will need NGT, discussed this with patient 4. Morphine prn pain 5. BID empiric protonix 6. Needs GI consult in AM, I suspect that next step will be EGD possibly with biopsies, possibly with duodenal / pyloric stenting or other procedure of some sort to alleviate obstruction. 2. ESRD - 1. Routine dialysis while inpatient 2. Will leave prograf alone for the moment, but nephrology should decide wether continuing this med is really indicated given failed kidney transplant and risk of lymphoproliferative disease as above.  Looking at CT scan I would be surprised to find out that the transplanted kidney is contributing much at all (more calcium there than kidney now). 3. HTN - 1. Continue home BP meds 2. Add PRN if needed   DVT prophylaxis: Heparin Code Status: Full Family Communication: Family at  bedside Consults called: Dr. Jonnie Finner called by EDP, also left voice mail on dialysis consult line. Admission status: Admit to inpatient   Etta Quill DO [facility] Pager [phone] from 7PM-7AM  If 7AM-7PM, please contact the day physician for the patient [email or web address] Password [email or web address]  03/31/2016, 11:24 PM

## 2016-03-31 NOTE — ED Triage Notes (Signed)
Pt from home with complaints of upper middle abdominal pain that began this morning, but has been on and off for about 3 weeks. Pt is also hypertensive at time of assessment. Pt states she has been out of her labetalol for about a week because she is waiting for her doctor to call it in to her pharmacy. Pt denies emesis, diarrhea, and denies urinary symptoms however she states she has extreme nausea and sharp pain 10/10.  Pt receives dialysis Monday, wed, and Friday.

## 2016-03-31 NOTE — ED Provider Notes (Signed)
Due to CT finding will admit due to worsening gastric outlet partial obstruction will be transferred to [facility] for dialysis tomorrow Dr. Jonnie Finner aware    Junius Creamer, NP 03/31/16 [place], MD 03/31/16 2332

## 2016-03-31 NOTE — ED Notes (Signed)
Lab reports that they should be able to add on the hCG quantitative test to the blood already in the lab.

## 2016-03-31 NOTE — ED Provider Notes (Signed)
[facility] DEPT Provider Note   CSN: [id number] Arrival date & time: 03/31/16  5427     History   Chief Complaint Chief Complaint  Patient presents with  . Abdominal Pain    HPI Kerry Cook is a 30 y.o. female.  Patient is a 30 year old female with history of CKD 2/2 HTN, s/p kidney transplant, hemodialysis pt (tx on M/W/F) who presents the ED with complaint of abdominal pain, onset this morning. Patient did have a prior transplanted kidney which failed. Patient states when she woke up this morning she began having constant sharp pain to her upper abdomen. Denies any aggravating or alleviating factors. Denies associated nausea. She reports taking one dose of pain medication she had not home from her prior ED visit without relief. She notes her pain is consistent with pain she has had over the past few weeks when she was evaluated in the ED for similar sxs. Patient also reports she has been out of her labetalol for the past week and notes she is waiting on a refill by her doctor. She reports continuing to take her amlodipine as prescribed. Denies fever, chills, headache, lightheadedness, dizziness, visual changes, chest pain, difficulty breathing, vomited, diarrhea, constipation, vaginal bleeding, vaginal discharge. Patient notes she was seen in the ED on 10/1 for similar abdominal pain, was discharged home and reports her pain had resolved over the past week until it recurred this morning. Pt reports LMP was in August, endorses having irregular menstrual cycles.   PCP- Dr. Smith Robert       Past Medical History:  Diagnosis Date  . Chronic kidney disease    previous hx dialysis  . Dialysis patient ([place])   . History of kidney transplant 2012   kidney failure due to hypertension  . Hypertension     Patient Active Problem List   Diagnosis Date Noted  . Medicare annual wellness visit, initial 10/31/2015  . ESRD (end stage renal disease) on dialysis ([place])   . Cephalalgia   .  History of renal transplant   . Headache 06/07/2015  . Hypertensive urgency 02/16/2015  . Absolute anemia   . SOB (shortness of breath) 02/01/2015  . Hypertension   . History of kidney transplant   . Pneumonia 01/23/2015  . Tachycardia   . Symptomatic anemia 01/20/2015  . Supervision of high risk pregnancy, antepartum 06/02/2014  . Anemia in chronic kidney disease 10/01/2007  . THROMBOCYTOPENIA 10/01/2007  . End stage renal disease on dialysis ([place]) 10/01/2007  . PAP SMEAR, LGSIL, ABNORMAL 10/01/2007  . HYPERTENSION, BENIGN SYSTEMIC 08/21/2006  . PROTEINURIA 08/21/2006    Past Surgical History:  Procedure Laterality Date  . KIDNEY TRANSPLANT Bilateral 2012    OB History    Gravida Para Term Preterm AB Living   1       1 0   SAB TAB Ectopic Multiple Live Births   1               Home Medications    Prior to Admission medications   Medication Sig Start Date End Date Taking? Authorizing Provider  amLODipine (NORVASC) 10 MG tablet Take 1 tablet (10 mg total) by mouth every evening. Patient taking differently: Take 10 mg by mouth every morning.  09/13/15  Yes [name], MD  calcium acetate (PHOSLO) 667 MG capsule Take 2,001 mg by mouth 3 (three) times daily with meals. 05/29/15  Yes Historical Provider, MD  cloNIDine (CATAPRES) 0.1 MG tablet Take 0.1 mg by mouth 2 (two) times  daily.   Yes Historical Provider, MD  labetalol (NORMODYNE) 200 MG tablet Take 1 tablet (200 mg total) by mouth 2 (two) times daily. 06/08/15  Yes Debbe Odea, MD  losartan (COZAAR) 50 MG tablet Take 1 tablet (50 mg total) by mouth every evening. 09/13/15  Yes [name], MD  multivitamin (RENA-VIT) TABS tablet Take 1 tablet by mouth at bedtime. 06/09/15  Yes Debbe Odea, MD  ondansetron (ZOFRAN ODT) 8 MG disintegrating tablet Take 1 tablet (8 mg total) by mouth every 8 (eight) hours as needed for nausea or vomiting. 03/24/16  Yes Jola Schmidt, MD  tacrolimus (PROGRAF) 1 MG capsule Take 1 mg  by mouth 2 (two) times daily.    Yes Historical Provider, MD  dicyclomine (BENTYL) 20 MG tablet Take 1 tablet (20 mg total) by mouth 2 (two) times daily. Patient not taking: Reported on 03/31/2016 03/24/16   Orpah Greek, MD  hydrALAZINE (APRESOLINE) 50 MG tablet Take 1 tablet (50 mg total) by mouth 2 (two) times daily. Patient not taking: Reported on 03/31/2016 09/13/15   [name], MD  pantoprazole (PROTONIX) 20 MG tablet Take 1 tablet (20 mg total) by mouth daily. Patient not taking: Reported on 03/31/2016 03/24/16   Orpah Greek, MD    Family History Family History  Problem Relation Age of Onset  . Family history unknown: Yes    Social History Social History  Substance Use Topics  . Smoking status: Never Smoker  . Smokeless tobacco: Never Used  . Alcohol use No     Allergies   Review of patient's allergies indicates no known allergies.   Review of Systems Review of Systems  Gastrointestinal: Positive for abdominal pain and nausea.  All other systems reviewed and are negative.    Physical Exam Updated Vital Signs BP (!) 195/133 (BP Location: Right Arm)   Pulse 96   Temp 98.8 F (37.1 C) (Oral)   Resp 16   LMP 01/27/2016 (Approximate)   SpO2 98%   Physical Exam  Constitutional: She is oriented to person, place, and time. She appears well-developed and well-nourished.  HENT:  Head: Normocephalic and atraumatic.  Mouth/Throat: Uvula is midline, oropharynx is clear and moist and mucous membranes are normal. No oropharyngeal exudate, posterior oropharyngeal edema, posterior oropharyngeal erythema or tonsillar abscesses. No tonsillar exudate.  Eyes: Conjunctivae and EOM are normal. Right eye exhibits no discharge. Left eye exhibits no discharge. No scleral icterus.  Neck: Normal range of motion. Neck supple.  Cardiovascular: Regular rhythm, normal heart sounds and intact distal pulses.   Tachycardic, HR 106  Pulmonary/Chest: Effort normal and  breath sounds normal. No respiratory distress. She has no wheezes. She has no rales. She exhibits no tenderness.  Abdominal: Soft. Bowel sounds are normal. She exhibits no distension and no mass. There is tenderness (TTP over upper abdominal quadrants, worse over epigastric region). There is no rebound and no guarding. No hernia.  No CVA tenderness. Well-healed surgical scars noted to RLQ.  Musculoskeletal: Normal range of motion. She exhibits no edema.  Neurological: She is alert and oriented to person, place, and time.  Skin: Skin is warm and dry. She is not diaphoretic.  Fistula with palpable thrill noted to left forearm.  Nursing note and vitals reviewed.    ED Treatments / Results  Labs (all labs ordered are listed, but only abnormal results are displayed) Labs Reviewed  COMPREHENSIVE METABOLIC PANEL - Abnormal; Notable for the following:       Result Value  Chloride 94 (*)    BUN 49 (*)    Creatinine, Ser 12.62 (*)    GFR calc non Af Amer 4 (*)    GFR calc Af Amer 4 (*)    Anion gap 17 (*)    All other components within normal limits  CBC - Abnormal; Notable for the following:    WBC 12.0 (*)    RBC 3.62 (*)    Hemoglobin 11.4 (*)    HCT 33.7 (*)    All other components within normal limits  LIPASE, BLOOD  HCG, QUANTITATIVE, PREGNANCY    EKG  EKG Interpretation None       Radiology No results found.  Procedures Procedures (including critical care time)  Medications Ordered in ED Medications  labetalol (NORMODYNE) tablet 200 mg (200 mg Oral Given 03/31/16 1757)  morphine 4 MG/ML injection 4 mg (4 mg Intravenous Given 03/31/16 1750)  ondansetron (ZOFRAN) injection 4 mg (4 mg Intravenous Given 03/31/16 1750)  iopamidol (ISOVUE-300) 61 % injection 30 mL (30 mLs Oral Contrast Given 03/31/16 2055)  iopamidol (ISOVUE-300) 61 % injection 100 mL (80 mLs Intravenous Contrast Given 03/31/16 2056)     Initial Impression / Assessment and Plan / ED Course  I have  reviewed the triage vital signs and the nursing notes.  Pertinent labs & imaging results that were available during my care of the patient were reviewed by me and considered in my medical decision making (see chart for details).  Clinical Course   Patient presents with abdominal pain which started this morning. Patient ESRD on hemodialysis 2/2 HTN. She reports being out of her labetalol for the past week and is waiting for a refill from her PCP. Denies fever. Reports one to dialysis as scheduled, last treatment 2 days ago. Exam revealed tenderness over upper abdominal quadrants, worse in the epigastric region. Remaining exam unremarkable. Patient given pain meds, antiemetics and home dose of labetalol.  Chart review shows patient was seen in the ED on 10/1 for similar abdominal pain. CT abdomen showed thickening of small bowel walls, findings could represent post transplant lymphoproliferative disorder versus acute enteritis. Patient discharged home with antiemetics and advised follow-up with nephrologist. She states her abdominal pain improved for a week until it recurred this morning.  WBC 12. Remaining labs appear to be at patient's baseline values. Ordered CT abdomen for further evaluation. On reevaluation patient reports her pain has improved.  Hand-off to Junius Creamer, NP. Pending CT abdomen.  Final Clinical Impressions(s) / ED Diagnoses   Final diagnoses:  None    New Prescriptions New Prescriptions   No medications on file     Nona Dell, PA-C 03/31/16 2114    Davonna Belling, MD 03/31/16 2333

## 2016-04-01 ENCOUNTER — Inpatient Hospital Stay (HOSPITAL_COMMUNITY): Payer: Medicare Other | Admitting: Anesthesiology

## 2016-04-01 ENCOUNTER — Encounter (HOSPITAL_COMMUNITY)
Admission: EM | Disposition: A | Discharge status: Home / Self Care | Payer: Self-pay | Source: Home / Self Care | Attending: Internal Medicine

## 2016-04-01 ENCOUNTER — Encounter (HOSPITAL_COMMUNITY): Payer: Self-pay | Admitting: General Practice

## 2016-04-01 DIAGNOSIS — N186 End stage renal disease: Secondary | ICD-10-CM

## 2016-04-01 DIAGNOSIS — Z992 Dependence on renal dialysis: Secondary | ICD-10-CM

## 2016-04-01 DIAGNOSIS — K259 Gastric ulcer, unspecified as acute or chronic, without hemorrhage or perforation: Secondary | ICD-10-CM

## 2016-04-01 DIAGNOSIS — K311 Adult hypertrophic pyloric stenosis: Principal | ICD-10-CM

## 2016-04-01 DIAGNOSIS — R933 Abnormal findings on diagnostic imaging of other parts of digestive tract: Secondary | ICD-10-CM

## 2016-04-01 HISTORY — PX: ESOPHAGOGASTRODUODENOSCOPY: SHX5428

## 2016-04-01 LAB — RENAL FUNCTION PANEL
Albumin: 2.8 g/dL — ABNORMAL LOW (ref 3.5–5.0)
Anion gap: 13 (ref 5–15)
BUN: 57 mg/dL — ABNORMAL HIGH (ref 6–20)
CO2: 25 mmol/L (ref 22–32)
Calcium: 8.6 mg/dL — ABNORMAL LOW (ref 8.9–10.3)
Chloride: 94 mmol/L — ABNORMAL LOW (ref 101–111)
Creatinine, Ser: 14.19 mg/dL — ABNORMAL HIGH (ref 0.44–1.00)
GFR calc Af Amer: 4 mL/min — ABNORMAL LOW
GFR calc non Af Amer: 3 mL/min — ABNORMAL LOW
Glucose, Bld: 85 mg/dL (ref 65–99)
Phosphorus: 7.9 mg/dL — ABNORMAL HIGH (ref 2.5–4.6)
Potassium: 5.2 mmol/L — ABNORMAL HIGH (ref 3.5–5.1)
Sodium: 132 mmol/L — ABNORMAL LOW (ref 135–145)

## 2016-04-01 LAB — CBC
HCT: 29.8 % — ABNORMAL LOW (ref 36.0–46.0)
Hemoglobin: 9.6 g/dL — ABNORMAL LOW (ref 12.0–15.0)
MCH: 30.6 pg (ref 26.0–34.0)
MCHC: 32.2 g/dL (ref 30.0–36.0)
MCV: 94.9 fL (ref 78.0–100.0)
Platelets: 137 K/uL — ABNORMAL LOW (ref 150–400)
RBC: 3.14 MIL/uL — ABNORMAL LOW (ref 3.87–5.11)
RDW: 15.4 % (ref 11.5–15.5)
WBC: 7.8 K/uL (ref 4.0–10.5)

## 2016-04-01 LAB — MRSA PCR SCREENING: MRSA by PCR: NEGATIVE

## 2016-04-01 SURGERY — EGD (ESOPHAGOGASTRODUODENOSCOPY)
Anesthesia: Monitor Anesthesia Care

## 2016-04-01 MED ORDER — BUTAMBEN-TETRACAINE-BENZOCAINE 2-2-14 % EX AERO
INHALATION_SPRAY | CUTANEOUS | Status: DC | PRN
  Administered 2016-04-01: 2 via TOPICAL

## 2016-04-01 MED ORDER — LIDOCAINE HCL (CARDIAC) 20 MG/ML IV SOLN
INTRAVENOUS | Status: DC | PRN
  Administered 2016-04-01: 50 mg via INTRAVENOUS

## 2016-04-01 MED ORDER — MORPHINE SULFATE (PF) 2 MG/ML IV SOLN
INTRAVENOUS | Status: AC
  Administered 2016-04-01: 2 mg via INTRAVENOUS
  Filled 2016-04-01: qty 1

## 2016-04-01 MED ORDER — SODIUM CHLORIDE 0.9 % IV SOLN
INTRAVENOUS | Status: DC | PRN
  Administered 2016-04-01: 13:00:00 via INTRAVENOUS

## 2016-04-01 MED ORDER — LIDOCAINE HCL (PF) 1 % IJ SOLN: 5.0000 mL | INTRAMUSCULAR | Status: DC | PRN

## 2016-04-01 MED ORDER — SODIUM CHLORIDE 0.9 % IV SOLN: 100.0000 mL | INTRAVENOUS | Status: DC | PRN

## 2016-04-01 MED ORDER — PROPOFOL 500 MG/50ML IV EMUL
INTRAVENOUS | Status: DC | PRN
  Administered 2016-04-01: 100 ug/kg/min via INTRAVENOUS

## 2016-04-01 MED ORDER — HYDRALAZINE HCL 20 MG/ML IJ SOLN
20.0000 mg | Freq: Once | INTRAMUSCULAR | Status: AC
  Administered 2016-04-01: 20 mg via INTRAVENOUS
  Filled 2016-04-01: qty 1

## 2016-04-01 MED ORDER — PROPOFOL 10 MG/ML IV BOLUS
INTRAVENOUS | Status: DC | PRN
  Administered 2016-04-01 (×3): 20 mg via INTRAVENOUS

## 2016-04-01 MED ORDER — PENTAFLUOROPROP-TETRAFLUOROETH EX AERO: 1.0000 "application " | INHALATION_SPRAY | CUTANEOUS | Status: DC | PRN

## 2016-04-01 MED ORDER — SODIUM CHLORIDE 0.9 % IV SOLN: INTRAVENOUS | Status: DC

## 2016-04-01 MED ORDER — LIDOCAINE-PRILOCAINE 2.5-2.5 % EX CREA: 1.0000 "application " | TOPICAL_CREAM | CUTANEOUS | Status: DC | PRN

## 2016-04-01 NOTE — Progress Notes (Signed)
PROGRESS NOTE                                                                                                                                                                                                             Patient Demographics:    Kerry Cook, is a 30 y.o. female, DOB - [birth year], UJW:[id number]  Admit date - 03/31/2016   Admitting Physician Etta Quill, DO  Outpatient Primary MD for the patient is [name], FNP  LOS - 1    Chief Complaint  Patient presents with  . Abdominal Pain       Brief Narrative  Kerry Cook is a 30 y.o. female with medical history significant of ESRD, failed renal transplant.  ESRD in setting of HTN.  Patient presents to the ED with c/o upper abdominal pain.   workup significant for gastric outlet obstruction with abnormal pylorus and proximal enteritis.   Subjective:    Kerry Cook today has, No headache, No chest pain, Reports abdominal pain significantly subsided, reports mild nausea, no vomiting.    Assessment  & Plan :    Principal Problem:   Gastric outlet obstruction Active Problems:   End stage renal disease on dialysis [facility])   Hypertension   Abnormal CT scan, small bowel   Gastric outlet obstruction - Current, progressively worsening, evidence of inflammation of the duodenum. - GI consult greatly appreciated, plan for endoscopy today, on differentials bilateral\: Ulcer, on PPI, posttransplant lymphoproliferative disorder, Prograf has been gradually tapered as an outpatient by renal, can be stopped as D/W Dr Lorrene Reid, keep nothing by mouth.   ESRD - Renal consulted, to continue with hemodialysis  Failed renal transplant - See above discussion regarding Prograf  Hypertension - Continue with home medication  Code Status : Full  Family Communication  : Discussed with fianc at bedside  Disposition Plan  : home when stable   Consults  :  Renal,  Gi  Procedures  : None  DVT Prophylaxis  :   Heparin - SCDs   Lab Results  Component Value Date   PLT 160 03/31/2016    Antibiotics  :    Anti-infectives    None        Objective:   Vitals:   04/01/16 0211 04/01/16 0505 04/01/16 0900 04/01/16 1228  BP: (!) 165/101 (!) 168/119 Marland Kitchen)  157/109 (!) 164/108  Pulse: 96 98 91 81  Resp: 16 16 16 14   Temp: 98.3 F (36.8 C) 98.3 F (36.8 C) 98.4 F (36.9 C) 98.2 F (36.8 C)  TempSrc: Oral Oral Oral Oral  SpO2: 100% 98% 99% 97%  Weight: 59.4 kg (131 lb)     Height: 5' (1.524 m)       Wt Readings from Last 3 Encounters:  04/01/16 59.4 kg (131 lb)  03/24/16 56.7 kg (125 lb)  03/23/16 56.7 kg (125 lb)     Intake/Output Summary (Last 24 hours) at 04/01/16 1330 Last data filed at 04/01/16 0900  Gross per 24 hour  Intake                0 ml  Output                0 ml  Net                0 ml     Physical Exam  Awake Alert, Oriented X 3,  Supple Neck,No JVD,   Symmetrical Chest wall movement, Good air movement bilaterally, CTAB RRR,No Gallops,Rubs or new Murmurs, No Parasternal Heave +ve B.Sounds, Abd Soft, nondistended, minimal tenderness in left abdomen. - No guarding or rigidity. No Cyanosis, Clubbing or edema, No new Rash or bruise     Data Review:    CBC  Recent Labs Lab 03/31/16 1751  WBC 12.0*  HGB 11.4*  HCT 33.7*  PLT 160  MCV 93.1  MCH 31.5  MCHC 33.8  RDW 15.3    Chemistries   Recent Labs Lab 03/31/16 1751  NA 138  K 4.6  CL 94*  CO2 27  GLUCOSE 76  BUN 49*  CREATININE 12.62*  CALCIUM 10.0  AST 29  ALT 21  ALKPHOS 64  BILITOT 1.2   ------------------------------------------------------------------------------------------------------------------ No results for input(s): CHOL, HDL, LDLCALC, TRIG, CHOLHDL, LDLDIRECT in the last 72 hours.  No results found for:  HGBA1C ------------------------------------------------------------------------------------------------------------------ No results for input(s): TSH, T4TOTAL, T3FREE, THYROIDAB in the last 72 hours.  Invalid input(s): FREET3 ------------------------------------------------------------------------------------------------------------------ No results for input(s): VITAMINB12, FOLATE, FERRITIN, TIBC, IRON, RETICCTPCT in the last 72 hours.  Coagulation profile No results for input(s): INR, PROTIME in the last 168 hours.  No results for input(s): DDIMER in the last 72 hours.  Cardiac Enzymes No results for input(s): CKMB, TROPONINI, MYOGLOBIN in the last 168 hours.  Invalid input(s): CK ------------------------------------------------------------------------------------------------------------------ No results found for: BNP  Inpatient Medications  Scheduled Meds: . [MAR Hold] amLODipine  10 mg Oral Daily  . [MAR Hold] calcium acetate  2,001 mg Oral TID WC  . [MAR Hold] cloNIDine  0.1 mg Oral BID  . [MAR Hold] heparin  5,000 Units Subcutaneous Q8H  . [MAR Hold] labetalol  200 mg Oral BID  . [MAR Hold] losartan  50 mg Oral QPM  . [MAR Hold] multivitamin  1 tablet Oral QHS  . [MAR Hold] pantoprazole  40 mg Oral BID  . [MAR Hold] tacrolimus  1 mg Oral BID   Continuous Infusions: . sodium chloride 50 mL/hr at 03/31/16 2354   PRN Meds:.[MAR Hold]  morphine injection, [MAR Hold] ondansetron **OR** [MAR Hold] ondansetron (ZOFRAN) IV  Micro Results Recent Results (from the past 240 hour(s))  MRSA PCR Screening     Status: None   Collection Time: 04/01/16  1:46 AM  Result Value Ref Range Status   MRSA by PCR NEGATIVE NEGATIVE Final    Comment:  The GeneXpert MRSA Assay (FDA approved for NASAL specimens only), is one component of a comprehensive MRSA colonization surveillance program. It is not intended to diagnose MRSA infection nor to guide or monitor treatment  for MRSA infections.     Radiology Reports Ct Abdomen Pelvis W Contrast  Result Date: 03/31/2016 CLINICAL DATA:  Upper abdominal pain and loss of appetite. EXAM: CT ABDOMEN AND PELVIS WITH CONTRAST TECHNIQUE: Multidetector CT imaging of the abdomen and pelvis was performed using the standard protocol following bolus administration of intravenous contrast. CONTRAST:  46mL ISOVUE-300 IOPAMIDOL (ISOVUE-300) INJECTION 61%, 66mL ISOVUE-300 IOPAMIDOL (ISOVUE-300) INJECTION 61% COMPARISON:  CT of the abdomen 10 06/2015 FINDINGS: Lower chest: No acute abnormality. Hepatobiliary: No focal liver abnormality is seen. No gallstones, gallbladder wall thickening, or biliary dilatation. Pancreas: Unremarkable. No pancreatic ductal dilatation or surrounding inflammatory changes. Spleen: Normal in size without focal abnormality. Adrenals/Urinary Tract: Atrophic native kidneys. 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 Stomach/Bowel: The stomach is significantly distended. Mild irregular thickening of the pylorus is seen. The duodenum is normal in caliber. There is persistent diffuse mucosal thickening of multiple proximal small bowel loops with increased inflammatory mesenteric stranding and moderate amount of free fluid in the abdomen and pelvis. The more distal small bowel loops have normal appearance. The colon has normal appearance. No evidence of small-bowel obstruction. Vascular/Lymphatic: No significant vascular findings are present. No enlarged abdominal or pelvic lymph nodes. Reproductive: Uterus and bilateral adnexa are unremarkable. Other: No abdominal wall hernia or abnormality. No abdominopelvic ascites. Musculoskeletal: No acute or significant osseous findings. IMPRESSION: Persistent prominent mucosal thickening of the proximal small bowel with increased inflammatory mesenteric stranding and moderate amount of free fluid in the abdomen and pelvis. Consideration of posttransplant  lymphoproliferative disorder versus acute infectious or inflammatory enteritis persists. Interval development of moderate gastric distention with irregular thickening of the pylorus, likely causing partial gastric outlet obstruction, and likely related to the same pathologic process. 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 Electronically Signed   By: Fidela Salisbury M.D.   On: 03/31/2016 21:51   Ct Abdomen Pelvis W Contrast  Result Date: 03/24/2016 CLINICAL DATA:  Patient c/o epigastric pain that began today. Patient states that was nauseas and has had a small amount of diarrhea today. Patient now complains of sharp pain in her abdomen. EXAM: CT ABDOMEN AND PELVIS WITH CONTRAST TECHNIQUE: Multidetector CT imaging of the abdomen and pelvis was performed using the standard protocol following bolus administration of intravenous contrast. CONTRAST:  60mL ISOVUE-300 IOPAMIDOL (ISOVUE-300) INJECTION 61% COMPARISON:  CT abdomen dated 06/08/2015. FINDINGS: Lower chest: No acute abnormality. Hepatobiliary: No focal liver abnormality is seen. No gallstones, gallbladder wall thickening, or biliary dilatation. Pancreas: Unremarkable. No pancreatic ductal dilatation or surrounding inflammatory changes. Spleen: Normal in size without focal abnormality. Adrenals/Urinary Tract: Native kidneys are atrophic bilaterally. The 2 right lower quadrant transplant kidneys now appear heavily calcified and likely atrophic, not well seen. Stomach/Bowel: There is extensive thickening of the walls of the small bowel in the upper abdomen and mid abdomen. Small bowel within the lower abdomen and pelvis has a relatively normal appearance. Large bowel is unremarkable. No dilated large or small bowel loops. Vascular/Lymphatic: No significant vascular findings are present. No enlarged abdominal or pelvic lymph nodes. Reproductive: Uterus and bilateral adnexa are unremarkable. Other: Moderate amount of free fluid  throughout the abdomen and pelvis. No circumscribed fluid collection or abscess collection seen. Musculoskeletal: No acute or suspicious osseous lesion. Superficial soft tissues  are unremarkable. IMPRESSION: 1. Prominent thickening of the walls of the small bowel throughout the upper abdomen and mid abdomen. Given the history of transplanted kidneys, this could represent post transplant lymphoproliferative disorder. Alternatively, findings could represent an acute enteritis of infectious or inflammatory nature. 2. Transplant kidneys within the right lower quadrant now appear heavily calcified, presumably atrophic, not well seen. Native kidneys are atrophic, similar in appearance to the previous exam. 3. Moderate amount of free fluid throughout the abdomen and pelvis. No circumscribed fluid collection or abscess collection seen. No free intraperitoneal air. These results were called by telephone at the time of interpretation on 03/24/2016 at 5:37 pm to Dr. Jola Schmidt , who verbally acknowledged these results. Electronically Signed   By: Franki Cabot M.D.   On: 03/24/2016 17:37     Kynslie Ringle M.D on 04/01/2016 at 1:30 PM  Between 7am to 7pm - Pager - [phone]  After 7pm go to [email or web address] - password [facility]  [facility] -  Office  [phone]

## 2016-04-01 NOTE — Consult Note (Signed)
[facility] Renal Consultation Note    Indication for Consultation:  Management of ESRD/hemodialysis, anemia, hypertension/volume, and secondary hyperparathyroidism. PCP: Cammie Sickle, FNP  HPI: Kerry Cook is a 30 y.o. female with ESRD (s/p failed kidney Tx 2012-2015), HTN who was admitted with abdominal pain and concern for distal gastric outlet obstruction on abdominal CT.  She has had intermittent sharp abdominal pain for months, was starting to improve, and then acutely worsened with sharp epigastric pain 10/8 prompting [facility] ED evaluation. Denies N/V and diarrhea currently, but has been associated with the abdominal pain in the past. Currently, denies fever, chills. In the ED, labs were normal except WBC 12. She then underwent abdominal CT which showed duodenal inflammation to the point of partial gastric outlet obstruction. She was transferred to [facility] for admission. Now, has been evaluated by GI and underwent EGD this morning which showed non-bleeding superficial gastric ulcers with "heaped up" mucosa.  From a renal standpoint, she is dialyzed MWF at [facility]. Last dialyzed 10/6. No recent dialysis issues. Usually stays entire treatments and leaves close to her EDW.   Past Medical History:  Diagnosis Date  . Chronic kidney disease    previous hx dialysis  . Dialysis patient ([place])   . History of kidney transplant 2012   kidney failure due to hypertension  . Hypertension    Past Surgical History:  Procedure Laterality Date  . KIDNEY TRANSPLANT Bilateral 2012   Family History  Problem Relation Age of Onset  . Family history unknown: Yes   Social History:  reports that she has never smoked. She has never used smokeless tobacco. She reports that she does not drink alcohol or use drugs.   Review of Systems: Gen: Denies any fever, chills, fatigue, weakness, malaise, weight loss, and/or sleep disorders. HEENT: Denies blurred vision, sore throat or  epistaxis. No sinusitis.   CV: Denies chest pain, angina, palpitations, orthopnea, PND, peripheral edema, and claudication. Resp: Denies dyspnea at rest, dyspnea with exercise, cough, sputum, or wheezing. GI: + Abdominal pain. Denies nausea, vomiting, diarrhea. GU: Anuric. MS: Denies joint pain, muscle pain or cramps.  Derm: Denies rashes, itching, or ulcerations. Heme: Denies bruising, bleeding, and enlarged lymph nodes. Neuro: No headache, dizziness, or weakness. Endocrine: No hypoglycemia or thyroid disease.    Physical Exam: Vitals:   04/01/16 0900 04/01/16 1228 04/01/16 1349 04/01/16 1400  BP: (!) 157/109 (!) 164/108 (!) 157/84 (!) 144/81  Pulse: 91 81 85 86  Resp: 16 14 19 18   Temp: 98.4 F (36.9 C) 98.2 F (36.8 C)    TempSrc: Oral Oral    SpO2: 99% 97% 93% 95%  Weight:      Height:         General: Well developed, well nourished, in no acute distress. Head: Normocephalic, atraumatic, sclera non-icteric, mucus membranes are moist. Neck: Supple without lymphadenopathy/masses. JVD not elevated. Lungs: Clear bilaterally to auscultation without wheezes, rales, or rhonchi. Breathing is unlabored. Heart: RRR with normal S1, S2. No murmurs, rubs, or gallops appreciated. Abdomen: Soft, non-tender, non-distended with normoactive bowel sounds. No rebound/guarding. No obvious abdominal masses. Musculoskeletal:  Strength and tone appear normal for age. Lower extremities: No edema or ischemic changes, no open wounds. Neuro: Alert and oriented X 3. Moves all extremities spontaneously. Psych:  Responds to questions appropriately with a normal affect. Dialysis Access: LUE AVF + thrill/bruit currently cannulated for HD  No Known Allergies Prior to Admission medications   Medication Sig Start Date End Date  Taking? Authorizing Provider  amLODipine (NORVASC) 10 MG tablet Take 1 tablet (10 mg total) by mouth every evening. Patient taking differently: Take 10 mg by mouth every morning.   09/13/15  Yes [name], MD  calcium acetate (PHOSLO) 667 MG capsule Take 2,001 mg by mouth 3 (three) times daily with meals. 05/29/15  Yes Historical Provider, MD  cloNIDine (CATAPRES) 0.1 MG tablet Take 0.1 mg by mouth 2 (two) times daily.   Yes Historical Provider, MD  labetalol (NORMODYNE) 200 MG tablet Take 1 tablet (200 mg total) by mouth 2 (two) times daily. 06/08/15  Yes Debbe Odea, MD  losartan (COZAAR) 50 MG tablet Take 1 tablet (50 mg total) by mouth every evening. 09/13/15  Yes [name], MD  multivitamin (RENA-VIT) TABS tablet Take 1 tablet by mouth at bedtime. 06/09/15  Yes Debbe Odea, MD  ondansetron (ZOFRAN ODT) 8 MG disintegrating tablet Take 1 tablet (8 mg total) by mouth every 8 (eight) hours as needed for nausea or vomiting. 03/24/16  Yes Jola Schmidt, MD  tacrolimus (PROGRAF) 1 MG capsule Take 1 mg by mouth 2 (two) times daily.    Yes Historical Provider, MD   Current Facility-Administered Medications  Medication Dose Route Frequency Provider Last Rate Last Dose  . 0.9 %  sodium chloride infusion   Intravenous Continuous Etta Quill, DO 50 mL/hr at 03/31/16 2354    . 0.9 %  sodium chloride infusion   Intravenous Continuous Vena Rua, PA-C      . amLODipine (NORVASC) tablet 10 mg  10 mg Oral Daily Etta Quill, DO   10 mg at 04/01/16 0858  . calcium acetate (PHOSLO) capsule 2,001 mg  2,001 mg Oral TID WC Etta Quill, DO      . cloNIDine (CATAPRES) tablet 0.1 mg  0.1 mg Oral BID Etta Quill, DO   0.1 mg at 04/01/16 0857  . heparin injection 5,000 Units  5,000 Units Subcutaneous Q8H Etta Quill, DO   5,000 Units at 04/01/16 0086  . labetalol (NORMODYNE) tablet 200 mg  200 mg Oral BID Etta Quill, DO   200 mg at 04/01/16 0858  . losartan (COZAAR) tablet 50 mg  50 mg Oral QPM Etta Quill, DO   50 mg at 03/31/16 2355  . morphine 2 MG/ML injection 2-4 mg  2-4 mg Intravenous Q4H PRN Etta Quill, DO   2 mg at 04/01/16 0211  .  multivitamin (RENA-VIT) tablet 1 tablet  1 tablet Oral QHS Etta Quill, DO   1 tablet at 03/31/16 2355  . ondansetron (ZOFRAN) tablet 4 mg  4 mg Oral Q6H PRN Etta Quill, DO       Or  . ondansetron [facility]) injection 4 mg  4 mg Intravenous Q6H PRN Etta Quill, DO      . pantoprazole (PROTONIX) EC tablet 40 mg  40 mg Oral BID Etta Quill, DO   40 mg at 04/01/16 0858   Labs:   Recent Labs Lab 03/31/16 1751  NA 138  K 4.6  CL 94*  CO2 27  GLUCOSE 76  BUN 49*  CREATININE 12.62*  CALCIUM 10.0    Recent Labs Lab 03/31/16 1751  AST 29  ALT 21  ALKPHOS 64  BILITOT 1.2  PROT 7.0  ALBUMIN 3.8    Recent Labs Lab 03/31/16 1751  LIPASE 24    Recent Labs Lab 03/31/16 1751  WBC 12.0*  HGB 11.4*  HCT 33.7*  MCV 93.1  PLT 160   Studies/Results: Ct Abdomen Pelvis W Contrast  Result Date: 03/31/2016 CLINICAL DATA:  Upper abdominal pain and loss of appetite. EXAM: CT ABDOMEN AND PELVIS WITH CONTRAST TECHNIQUE: Multidetector CT imaging of the abdomen and pelvis was performed using the standard protocol following bolus administration of intravenous contrast. CONTRAST:  55mL ISOVUE-300 IOPAMIDOL (ISOVUE-300) INJECTION 61%, 45mL ISOVUE-300 IOPAMIDOL (ISOVUE-300) INJECTION 61% COMPARISON:  CT of the abdomen 10 06/2015 FINDINGS: Lower chest: No acute abnormality. Hepatobiliary: No focal liver abnormality is seen. No gallstones, gallbladder wall thickening, or biliary dilatation. Pancreas: Unremarkable. No pancreatic ductal dilatation or surrounding inflammatory changes. Spleen: Normal in size without focal abnormality. Adrenals/Urinary Tract: Atrophic native kidneys. 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 Stomach/Bowel: The stomach is significantly distended. Mild irregular thickening of the pylorus is seen. The duodenum is normal in caliber. There is persistent diffuse mucosal thickening of multiple proximal small bowel loops with increased inflammatory  mesenteric stranding and moderate amount of free fluid in the abdomen and pelvis. The more distal small bowel loops have normal appearance. The colon has normal appearance. No evidence of small-bowel obstruction. Vascular/Lymphatic: No significant vascular findings are present. No enlarged abdominal or pelvic lymph nodes. Reproductive: Uterus and bilateral adnexa are unremarkable. Other: No abdominal wall hernia or abnormality. No abdominopelvic ascites. Musculoskeletal: No acute or significant osseous findings. IMPRESSION: Persistent prominent mucosal thickening of the proximal small bowel with increased inflammatory mesenteric stranding and moderate amount of free fluid in the abdomen and pelvis. Consideration of posttransplant lymphoproliferative disorder versus acute infectious or inflammatory enteritis persists. Interval development of moderate gastric distention with irregular thickening of the pylorus, likely causing partial gastric outlet obstruction, and likely related to the same pathologic process. 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 Electronically Signed   By: Fidela Salisbury M.D.   On: 03/31/2016 21:51    Dialysis Orders:  Center:  [facility] on MWF. 3:45 hours,  EDW 56kg,  2K/2.25Ca bath,  BFR 400/DFR A 1.5,  AVF - Heparin 6900 units bolus q HD - Hectoral 58mcg IV q HD  Assessment/Plan:  1.  Abdominal pain/Gastric ulcerations: Per primary and GI. On PO protonix BID. Gastric biopsies pending. 2.  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 No heparin with HD 10/9 s/p EGD with biopsies. Per H&P, there was some concern about possible lymphoproliferative disorder due to her immunosuppressants. Her usual nephrologist had already been slowly tapering her off immunosuppressants following failed kidney transplant, only thing left was low dose prograf, which can certainly be discontinued at this time. 3.  Hypertension/volume: BP slightly high.  Continue outpatient meds and EDW for now. She has minimal if any edema on exam, despite scales showing about 4 kg over EDW. Currently on HD BP 130/100. I have lowered goal to 3 liters for this TMT. May need EDW raised. 4.  Anemia: Hgb 11.4. No ESA needed for now. 5.  Metabolic bone disease: Ca 10. Last Phos uncontrolled. Continue binders/VDRA for now. 6.  Nutrition:  Albumin 3.8. Continue high protein diet once allowed to eat.  Veneta Penton, PA-C 04/01/2016, 3:21 PM  [facility] Pager: [phone]  I have seen and examined this patient and agree with plan and assessment in the above note with renal recommendations/intervention highlighted. Currently on HD. Reassessing EDW. No abd pain, nausea or vomiting right now and abd soft. CT results noted. Biopsies from today's EGD pending. Concern raised from CT findings about PTLD vs acute infectious  or imflammatory enteritis - OK to stop prograf (has been tapered over time so no harm in stopping at this juncture).  HD tonight. Reassess EDW (I think needs increase if the scales are correct).   Kiffany Schelling B,MD 04/01/2016 5:08 PM

## 2016-04-01 NOTE — Procedures (Signed)
I have personally attended this patient's dialysis session.   Goal lowered to 3 kg (no edema, clear lungs) 2K bath Left FA AVF cannulated BFR 400 access pressures fine  Jamal Maes, MD [facility] [phone] Pager 04/01/2016, 5:14 PM

## 2016-04-01 NOTE — Consult Note (Signed)
[facility] Consult: 9:48 AM 04/01/2016  LOS: 1 day    Referring Provider: Dr Waldron Labs  Primary Care Physician:  [name], FNP Primary Gastroenterologist:  unassigned  Renal MD: Dr Lemar Livings IMPRESSION:   *  Gastric Outlet Obstruction, abnormal pylorus. Proximal SB enteritis.    *  ESRD, on MWF HD.  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     * Remains immunosuppressed on tacrolimus   PLAN:     *  Will d/w Dr Carlean Purl.  Pt will need EGD/enteroscopy.   She is agreeable to proceed. Azucena Freed  04/01/2016, 9:48 AM Pager: [phone]   [facility] Attending   I have taken an interval history, reviewed the chart and examined the patient. I agree with the Advanced Practitioner's note, impression and recommendations.    EGD/enteroscopy is next step to see if we can discover cause of gastric and small bowel abnormalities.  The risks and benefits as well as alternatives of endoscopic procedure(s) have been discussed and reviewed. All questions answered. The patient agrees to proceed.  Gatha Mayer, MD, [facility] [phone] (pager) [phone] after 5 PM, weekends and holidays  04/01/2016 1:03 PM   Reason for Consultation:  Gastric outlet obstruction.    HPI: Kerry Cook is a 30 y.o. female.  PMH renal failure due to htn.  ESRD, dialysis MWF.  S/p bil renal transplant 2012, by 2015 kidneys had failed.  No previous GI issues or endoscopic procedures.  Anemia.  Last transfusion was 01/2015 x 4.      internittent upper abdominal pain, somewhat worsened by eating onset ~ 3 weeks ago.  Went to ED 9/30.   CT scan 10/1 showed "prominent thickening of the walls of the small bowel throughout upper and mid abdomen. 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 Alternatively, findings could represent an acute enteritis of infectious or inflammatory nature." Some abdominal and pelvic FF.   Discharge on Bentyl BID and Protonix 20 mg daily.  She did not fill RX right away, took meds for 1 day and returned to ED with persistent sxs.   She says appetite is preserved.  Only 1 or 2 episodes of non-bloody N/V.  No diarrhea.    Returned to ED  CT scan now: thickening and inflammation in prox SB and adjacent mesentery persist and more pronounced.  New onset of gastric distention and pyloric irregularity likely represent at lest partial GOO.   WBCs at 12K.  Renal labs c/w her ESRD.  LFTs, lipase is normal.  BP hypertensive.    Pt uses no NSAIDS or ASA.  Not sure if she is getting epo type meds or parenteral at HD.  Await renal notes to confirm if these are in use.   Past Medical History:  Diagnosis Date  . Chronic kidney disease    previous hx dialysis  . Dialysis patient ([place])   . History of kidney transplant 2012   kidney failure due to hypertension  . Hypertension     Past Surgical History:  Procedure Laterality Date  . KIDNEY TRANSPLANT Bilateral 2012    Prior to Admission medications   Medication Sig Start Date End Date Taking? Authorizing Provider  amLODipine (NORVASC) 10 MG tablet Take 1 tablet (10 mg total) by mouth every evening. Patient taking differently: Take 10 mg by mouth every morning.  09/13/15  Yes [name], MD  calcium acetate (PHOSLO) 667 MG capsule Take 2,001 mg by mouth 3 (three) times daily with meals. 05/29/15  Yes Historical Provider, MD  cloNIDine (CATAPRES) 0.1 MG tablet Take 0.1 mg by mouth 2 (two) times daily.   Yes Historical Provider, MD  labetalol (NORMODYNE) 200 MG tablet Take 1 tablet (200 mg total) by mouth 2 (two) times daily. 06/08/15  Yes Debbe Odea, MD  losartan (COZAAR) 50 MG tablet Take 1 tablet (50 mg total) by mouth every evening. 09/13/15  Yes [name],  MD  multivitamin (RENA-VIT) TABS tablet Take 1 tablet by mouth at bedtime. 06/09/15  Yes Debbe Odea, MD  ondansetron (ZOFRAN ODT) 8 MG disintegrating tablet Take 1 tablet (8 mg total) by mouth every 8 (eight) hours as needed for nausea or vomiting. 03/24/16  Yes Jola Schmidt, MD  tacrolimus (PROGRAF) 1 MG capsule Take 1 mg by mouth 2 (two) times daily.    Yes Historical Provider, MD    Scheduled Meds: . amLODipine  10 mg Oral Daily  . calcium acetate  2,001 mg Oral TID WC  . cloNIDine  0.1 mg Oral BID  . heparin  5,000 Units Subcutaneous Q8H  . labetalol  200 mg Oral BID  . losartan  50 mg Oral QPM  . multivitamin  1 tablet Oral QHS  . pantoprazole  40 mg Oral BID  . tacrolimus  1 mg Oral BID   Infusions: . sodium chloride 50 mL/hr at 03/31/16 2354   PRN Meds: morphine injection, ondansetron **OR** ondansetron (ZOFRAN) IV   Allergies as of 03/31/2016  . (No Known Allergies)    Family History  Problem Relation Age of Onset  . Family history unknown: Yes    Social History   Social History  . Marital status: Single    Spouse name: N/A  . Number of children: N/A  . Years of education: N/A   Occupational History  . Not on file.   Social History Main Topics  . Smoking status: Never Smoker  . Smokeless tobacco: Never Used  . Alcohol use No  . Drug use: No  . Sexual activity: Yes    Partners: Male    Birth control/ protection: Condom, None   Other Topics Concern  . Not on file   Social History Narrative  . No narrative on file    REVIEW OF SYSTEMS: Constitutional:  Some fatigue, not profound.   ENT:  No nose bleeds Pulm:  No cough or dyspnea CV:  No palpitations, no LE edema.  No chest pain.  GU:  anuric GI:  Per  HPI.  No dysphagia Heme:  No unusual bleeding or bruising   Transfusions:  Per HPI Neuro:  No headaches, no peripheral tingling or numbness Derm:  No itching, no rash or sores.  Endocrine:  No sweats or chills.  No polyuria or  dysuria Immunization:  Not queried.  Travel:  None beyond local counties in last few months.    PHYSICAL EXAM: Vital signs in last 24 hours: Vitals:   04/01/16 0505 04/01/16 0900  BP: (!) 168/119 (!) 157/109  Pulse: 98 91  Resp: 16 16  Temp: 98.3 F (36.8 C) 98.4 F (36.9 C)   Wt Readings from Last 3 Encounters:  04/01/16 59.4 kg (131 lb)  03/24/16 56.7 kg (125 lb)  03/23/16 56.7 kg (125 lb)    General: pleasant, not acutely ill looking.   Head:  No asymmetry or facial edema  Eyes:  No icterus or conj pallor Ears:  Not HOH  Nose:  No congestion or discharge Mouth:  Clear, moist.  Tongue mid-line Neck:  No mass, JVD or TMG Lungs:  Clear bil.  No cough or dyspnea Heart: RRR.  No mrg.  S1, s2 present.  Abdomen:  Soft, BS quiet but no tinkling or tympanitic BC.  ND.  Not currently tender.    Rectal: deferred   Musc/Skeltl: no joint swelling or gross distortion Extremities:  No CCE  Neurologic:  Oriented x 3.  No tremor or limb weakness.   Skin:  No rash or sores   Psych:  Pleasant, cooperative, calm.   Intake/Output from previous day: No intake/output data recorded. Intake/Output this shift: No intake/output data recorded.  LAB RESULTS:  Recent Labs  03/31/16 1751  WBC 12.0*  HGB 11.4*  HCT 33.7*  PLT 160   BMET Lab Results  Component Value Date   NA 138 03/31/2016   NA 137 03/24/2016   NA 138 03/23/2016   K 4.6 03/31/2016   K 4.9 03/24/2016   K 3.8 03/23/2016   CL 94 (L) 03/31/2016   CL 95 (L) 03/24/2016   CL 95 (L) 03/23/2016   CO2 27 03/31/2016   CO2 28 03/24/2016   CO2 27 03/23/2016   GLUCOSE 76 03/31/2016   GLUCOSE 81 03/24/2016   GLUCOSE 84 03/23/2016   BUN 49 (H) 03/31/2016   BUN 51 (H) 03/24/2016   BUN 40 (H) 03/23/2016   CREATININE 12.62 (H) 03/31/2016   CREATININE 12.08 (H) 03/24/2016   CREATININE 9.96 (H) 03/23/2016   CALCIUM 10.0 03/31/2016   CALCIUM 9.7 03/24/2016   CALCIUM 9.9 03/23/2016   LFT  Recent Labs  03/31/16 1751   PROT 7.0  ALBUMIN 3.8  AST 29  ALT 21  ALKPHOS 64  BILITOT 1.2   PT/INR Lab Results  Component Value Date   INR 1.0 09/03/2007   INR 1.0 09/02/2007   INR 1.0 08/26/2007   Hepatitis Panel No results for input(s): HEPBSAG, HCVAB, HEPAIGM, HEPBIGM in the last 72 hours. C-Diff No components found for: CDIFF Lipase     Component Value Date/Time   LIPASE 24 03/31/2016 1751    Drugs of Abuse     Component Value Date/Time   LABOPIA NEG 06/02/2014 1103   COCAINSCRNUR NEG 06/02/2014 1103   LABBENZ NEG 06/02/2014 1103   LABBENZ NEGATIVE 08/26/2007 0820   AMPHETMU NEG 06/02/2014 1103   THCU NEG 06/02/2014 1103   LABBARB NEG 06/02/2014 1103     RADIOLOGY STUDIES: Ct Abdomen Pelvis W Contrast  Result Date: 03/31/2016 CLINICAL DATA:  Upper abdominal pain and loss of appetite. EXAM: CT ABDOMEN AND PELVIS WITH CONTRAST TECHNIQUE: Multidetector CT imaging of the abdomen and pelvis was performed using the standard protocol following bolus administration of intravenous contrast. CONTRAST:  24mL ISOVUE-300 IOPAMIDOL (ISOVUE-300) INJECTION 61%, 84mL ISOVUE-300 IOPAMIDOL (ISOVUE-300) INJECTION 61% COMPARISON:  CT of the abdomen 10 06/2015 FINDINGS: Lower chest: No acute abnormality. Hepatobiliary: No focal liver abnormality is seen. No gallstones, gallbladder wall thickening, or biliary dilatation. Pancreas: Unremarkable. No pancreatic ductal dilatation or surrounding inflammatory changes. Spleen: Normal in size without focal abnormality. Adrenals/Urinary Tract: Atrophic native kidneys. 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 Stomach/Bowel: The stomach is significantly distended. Mild irregular thickening of the pylorus is seen. The duodenum is normal in caliber. There is persistent diffuse mucosal thickening of multiple proximal small bowel loops with increased inflammatory mesenteric stranding and moderate amount of free fluid in the abdomen and pelvis. The more distal small  bowel loops have normal appearance. The colon has normal appearance. No evidence of small-bowel obstruction. Vascular/Lymphatic: No significant vascular findings are present. No enlarged abdominal or pelvic lymph nodes. Reproductive: Uterus and bilateral adnexa are unremarkable. Other: No abdominal wall hernia or abnormality. No abdominopelvic ascites. Musculoskeletal: No acute or significant osseous findings. IMPRESSION: Persistent prominent mucosal thickening of the proximal small bowel with increased inflammatory mesenteric stranding and moderate amount of free fluid in the abdomen and pelvis. Consideration of posttransplant lymphoproliferative disorder versus acute infectious or inflammatory enteritis persists. Interval development of moderate gastric distention with irregular thickening of the pylorus, likely causing partial gastric outlet obstruction, and likely related to the same pathologic process. 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 Electronically Signed   By: Fidela Salisbury M.D.   On: 03/31/2016 21:51

## 2016-04-01 NOTE — Anesthesia Postprocedure Evaluation (Signed)
Anesthesia Post Note  Patient: Kerry Cook  Procedure(s) Performed: Procedure(s) (LRB): ESOPHAGOGASTRODUODENOSCOPY (EGD) (N/A)  Patient location during evaluation: PACU Anesthesia Type: MAC Level of consciousness: awake and alert Pain management: pain level controlled Vital Signs Assessment: post-procedure vital signs reviewed and stable Respiratory status: spontaneous breathing, nonlabored ventilation and respiratory function stable Cardiovascular status: stable and blood pressure returned to baseline Anesthetic complications: no    Last Vitals:  Vitals:   04/01/16 1930 04/01/16 2000  BP: (!) 149/92 (!) 155/106  Pulse: 84 92  Resp:    Temp:      Last Pain:  Vitals:   04/01/16 1228  TempSrc: Oral  PainSc:                  Deserie Dirks A

## 2016-04-01 NOTE — Anesthesia Preprocedure Evaluation (Signed)
Anesthesia Evaluation  Patient identified by MRN, date of birth, ID band Patient awake    Reviewed: Allergy & Precautions, NPO status , Patient's Chart, lab work & pertinent test results  Airway Mallampati: III  TM Distance: >3 FB Neck ROM: Full    Dental no notable dental hx.    Pulmonary pneumonia,    Pulmonary exam normal breath sounds clear to auscultation       Cardiovascular hypertension, Pt. on medications Normal cardiovascular exam Rhythm:Regular Rate:Normal     Neuro/Psych  Headaches, negative neurological ROS  negative psych ROS   GI/Hepatic negative GI ROS, Neg liver ROS,   Endo/Other  negative endocrine ROS  Renal/GU ESRFRenal disease  negative genitourinary   Musculoskeletal negative musculoskeletal ROS (+)   Abdominal   Peds negative pediatric ROS (+)  Hematology negative hematology ROS (+) anemia ,   Anesthesia Other Findings   Reproductive/Obstetrics negative OB ROS Pt does not make urine. Discussed serum pregnancy test, but pt declined                             Anesthesia Physical Anesthesia Plan  ASA: III  Anesthesia Plan: MAC   Post-op Pain Management:    Induction: Intravenous  Airway Management Planned:   Additional Equipment:   Intra-op Plan:   Post-operative Plan:   Informed Consent: I have reviewed the patients History and Physical, chart, labs and discussed the procedure including the risks, benefits and alternatives for the proposed anesthesia with the patient or authorized representative who has indicated his/her understanding and acceptance.   Dental advisory given  Plan Discussed with: CRNA  Anesthesia Plan Comments:         Anesthesia Quick Evaluation

## 2016-04-01 NOTE — Transfer of Care (Signed)
Immediate Anesthesia Transfer of Care Note  Patient: Kerry Cook  Procedure(s) Performed: Procedure(s): ESOPHAGOGASTRODUODENOSCOPY (EGD) (N/A)  Patient Location: Endoscopy Unit  Anesthesia Type:MAC  Level of Consciousness: oriented, sedated and patient cooperative  Airway & Oxygen Therapy: Patient Spontanous Breathing and Patient connected to nasal cannula oxygen  Post-op Assessment: Report given to RN and Post -op Vital signs reviewed and stable  Post vital signs: Reviewed  Last Vitals:  Vitals:   04/01/16 1228 04/01/16 1349  BP: (!) 164/108 (!) 157/84  Pulse: 81 85  Resp: 14 19  Temp: 36.8 C     Last Pain:  Vitals:   04/01/16 1228  TempSrc: Oral  PainSc:          Complications: No apparent anesthesia complications

## 2016-04-01 NOTE — Progress Notes (Signed)
Patient arrived to unit per bed.  Reviewed treatment plan and this RN agrees.  Report received from bedside RN, Miranda.  Consent obtained.  Patient A & o X 4. Lung sounds clear to ausculation in all fields. Generalized edema. Cardiac: NSR.  Prepped LLAVF with alcohol and cannulated with two 15 gauge needles.  Pulsation of blood noted.  Flushed access well with saline per protocol.  Connected and secured lines and initiated tx at 1640.  UF goal of 4500 mL and net fluid removal of 4000 mL.  Will continue to monitor.

## 2016-04-01 NOTE — Progress Notes (Addendum)
Dialysis treatment completed.  3500 mL ultrafiltrated and net fluid removal 3000 mL.    Patient status unchanged. Lung sounds clear to ausculation in all fields. No edema. Cardiac: NSr.  Disconnected lines and removed needles.  Pressure held for 10 minutes and band aid/gauze dressing applied.  Report given to bedside RN, Kami.

## 2016-04-01 NOTE — Op Note (Signed)
[facility] Patient Name: Kerry Cook Procedure Date : 04/01/2016 MRN: [id number] Attending MD: Gatha Mayer , MD Date of Birth: 1986/01/04 CSN: [id number] Age: 30 Admit Type: Inpatient Procedure:                Upper GI endoscopy Indications:              Abnormal CT of the GI tract - DIKLATED STOMACH ?                            GASTRIC OUTLET OBSTRUCTION WITH PRIXMAL SMALL BOWEL                            THICKENING AND ASCITES - S/P FAILED KIDNEY                            TRANSPLANT BUT STILL ON TACROLIMUS Providers:                Gatha Mayer, MD, Kingsley Plan, RN, Corliss Parish, Technician Referring MD:              Medicines:                Monitored Anesthesia Care Complications:            No immediate complications. Estimated Blood Loss:     Estimated blood loss was minimal. Procedure:                Pre-Anesthesia Assessment:                           - Prior to the procedure, a History and Physical                            was performed, and patient medications and                            allergies were reviewed. The patient's tolerance of                            previous anesthesia was also reviewed. The risks                            and benefits of the procedure and the sedation                            options and risks were discussed with the patient.                            All questions were answered, and informed consent                            was obtained. Prior Anticoagulants: The patient has  taken no previous anticoagulant or antiplatelet                            agents. ASA Grade Assessment: III - A patient with                            severe systemic disease. After reviewing the risks                            and benefits, the patient was deemed in                            satisfactory condition to undergo the procedure.  After obtaining informed consent, the endoscope was                            passed under direct vision. Throughout the                            procedure, the patient's blood pressure, pulse, and                            oxygen saturations were monitored continuously. The                            EG-[id number] ([id number]) scope was introduced through the                            mouth, and advanced to the second part of duodenum.                            The upper GI endoscopy was accomplished without                            difficulty. The patient tolerated the procedure                            well. Scope In: Scope Out: Findings:      Few non-bleeding superficial gastric ulcers w/ surrounding heaped up       foldswith no stigmata of bleeding were found in the prepyloric and       pyloric region of the stomach. The largest lesion was 7 mm in largest       dimension. Biopsies were taken with a cold forceps for histology.       Estimated blood loss was minimal.      The examined duodenum was normal. Biopsies were taken with a cold       forceps for histology. Verification of patient identification for the       specimen was done. Estimated blood loss was minimal.      The exam was otherwise without abnormality.      The cardia and gastric fundus were normal on retroflexion. Impression:               - Non-bleeding superficial gastric ulcers, heaped  up mucosa surrounding - pre-pyloric and pyloric                            with no stigmata of bleeding. Biopsied.                           - Normal examined duodenum. Biopsied.                           - The examination was otherwise normal. Moderate Sedation:      Please see anesthesia notes, moderate sedation not given Recommendation:           - Return patient to hospital ward for ongoing care.                           - Continue present medications.                           - Await pathology  results.                           - Clear liquid diet. Procedure Code(s):        --- Professional ---                           [phone], Esophagogastroduodenoscopy, flexible,                            transoral; with biopsy, single or multiple Diagnosis Code(s):        --- Professional ---                           K25.9, Gastric ulcer, unspecified as acute or                            chronic, without hemorrhage or perforation                           R93.3, Abnormal findings on diagnostic imaging of                            other parts of digestive tract CPT copyright 2016 [employer]. All rights reserved. The codes documented in this report are preliminary and upon coder review may  be revised to meet current compliance requirements. Gatha Mayer, MD 04/01/2016 1:53:09 PM This report has been signed electronically. Number of Addenda: 0

## 2016-04-02 ENCOUNTER — Encounter (HOSPITAL_COMMUNITY): Payer: Self-pay | Admitting: Internal Medicine

## 2016-04-02 MED ORDER — DARBEPOETIN ALFA 40 MCG/0.4ML IJ SOSY: 40.0000 ug | PREFILLED_SYRINGE | INTRAMUSCULAR | Status: DC

## 2016-04-02 MED ORDER — LABETALOL HCL 200 MG PO TABS: 200.0000 mg | ORAL_TABLET | Freq: Two times a day (BID) | ORAL | 1 refills | Status: DC

## 2016-04-02 MED ORDER — PANTOPRAZOLE SODIUM 40 MG PO TBEC: 40.0000 mg | DELAYED_RELEASE_TABLET | Freq: Two times a day (BID) | ORAL | 1 refills | Status: DC

## 2016-04-02 MED ORDER — DOXERCALCIFEROL 4 MCG/2ML IV SOLN: 6.0000 ug | INTRAVENOUS | Status: DC

## 2016-04-02 NOTE — Progress Notes (Signed)
          Daily Rounding Note  04/02/2016, 10:03 AM  LOS: 2 days   SUBJECTIVE:   Chief complaint: Upper abdominal pain, resolved.    Tolerated liquid diet this morning and diet has been advanced to renal/solid She feels great.  No further upper abdominal pain.  Happy that she can eat because she's been hungry.  OBJECTIVE:         Vital signs in last 24 hours:    Temp:  [97.8 F (36.6 C)-98.3 F (36.8 C)] 98.2 F (36.8 C) (10/10 0900) Pulse Rate:  [71-92] 80 (10/10 0900) Resp:  [12-19] 18 (10/10 0900) BP: (131-164)/(81-108) 152/96 (10/10 0900) SpO2:  [93 %-100 %] 100 % (10/10 0900) Weight:  [57.4 kg (126 lb 8.7 oz)-60.4 kg (133 lb 2.5 oz)] 59 kg (130 lb 1.6 oz) (10/09 2106) Last BM Date: 04/02/16 Filed Weights   04/01/16 1633 04/01/16 2025 04/01/16 2106  Weight: 60.4 kg (133 lb 2.5 oz) 57.4 kg (126 lb 8.7 oz) 59 kg (130 lb 1.6 oz)   General: Pleasant, bright affect. Comfortable. Looks well.   Heart: RRR. No MRG. Chest:  No dyspnea or cough.  Lungs are clear Abdomen: Active bowel sounds. Not tender or distended.  Extremities: No CCE. Neuro/Psych:  Pleasant, calm. No gross deficits. Fully alert and oriented  Lab Results:  Recent Labs  03/31/16 1751 04/01/16 1652  WBC 12.0* 7.8  HGB 11.4* 9.6*  HCT 33.7* 29.8*  PLT 160 137*   BMET  Recent Labs  03/31/16 1751 04/01/16 1651  NA 138 132*  K 4.6 5.2*  CL 94* 94*  CO2 27 25  GLUCOSE 76 85  BUN 49* 57*  CREATININE 12.62* 14.19*  CALCIUM 10.0 8.6*   LFT  Recent Labs  03/31/16 1751 04/01/16 1651  PROT 7.0  --   ALBUMIN 3.8 2.8*  AST 29  --   ALT 21  --   ALKPHOS 64  --   BILITOT 1.2  --     ASSESMENT:   *  PUD with partial GOO 10/9 EGD: Non-bleeding pre-pyloric and pyloric superficial gastric ulcers a/w heaped up mucosa.  Path is benign: reactive gastropathy, mild chronic inflammation, no villous blunting, no H Pylori.   On BID, PO Protonix.  Had  only taken 1 dose of Protonix in day PTA (RXd in ED).   *  ESRD, on MWF HD.  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     Remains immunosuppressed on tacrolimus  *  Normocytic anemia.  Receives no epo or parenteral iron at outpt HD.    *  Non-critical thrombocytopenia.   PLAN   *  For now, continue Protonix 40 mg by mouth twice a day   Azucena Freed  04/02/2016, 10:03 AM Pager: [place] Attending   I have taken an interval history, reviewed the chart and examined the patient. I agree with the Advanced Practitioner's note, impression and recommendations.     bxs of gastric ulceration - inflammation only Duodenum ok ? Viral gastroenteritis  Conservative f/u see GI prn Patient aware  Gatha Mayer, MD, [facility] [phone] (pager) [phone] after 5 PM, weekends and holidays  04/02/2016 5:19 PM

## 2016-04-02 NOTE — Progress Notes (Signed)
Negative duodenal and gastric biopsies Will f/u in hospital No letter/recall

## 2016-04-02 NOTE — Discharge Instructions (Signed)
Follow with Primary MD Hollis,Lachina M, FNP in 7 days   Get CBC, CMP, 2 view Chest X ray checked  by Primary MD next visit.    Activity: As tolerated with Full fall precautions use walker/cane & assistance as needed   Disposition Home    Diet: Renal modified 1200 mL fluid restriction , with feeding assistance and aspiration precautions.  For Heart failure patients - Check your Weight same time everyday, if you gain over 2 pounds, or you develop in leg swelling, experience more shortness of breath or chest pain, call your Primary MD immediately. Follow Cardiac Low Salt Diet and 1.5 lit/day fluid restriction.   On your next visit with your primary care physician please Get Medicines reviewed and adjusted.   Please request your Prim.MD to go over all Hospital Tests and Procedure/Radiological results at the follow up, please get all Hospital records sent to your Prim MD by signing hospital release before you go home.   If you experience worsening of your admission symptoms, develop shortness of breath, life threatening emergency, suicidal or homicidal thoughts you must seek medical attention immediately by calling 911 or calling your MD immediately  if symptoms less severe.  You Must read complete instructions/literature along with all the possible adverse reactions/side effects for all the Medicines you take and that have been prescribed to you. Take any new Medicines after you have completely understood and accpet all the possible adverse reactions/side effects.   Do not drive, operating heavy machinery, perform activities at heights, swimming or participation in water activities or provide baby sitting services if your were admitted for syncope or siezures until you have seen by Primary MD or a Neurologist and advised to do so again.  Do not drive when taking Pain medications.    Do not take more than prescribed Pain, Sleep and Anxiety Medications  Special Instructions: If you have  smoked or chewed Tobacco  in the last 2 yrs please stop smoking, stop any regular Alcohol  and or any Recreational drug use.  Wear Seat belts while driving.   Please note  You were cared for by a hospitalist during your hospital stay. If you have any questions about your discharge medications or the care you received while you were in the hospital after you are discharged, you can call the unit and asked to speak with the hospitalist on call if the hospitalist that took care of you is not available. Once you are discharged, your primary care physician will handle any further medical issues. Please note that NO REFILLS for any discharge medications will be authorized once you are discharged, as it is imperative that you return to your primary care physician (or establish a relationship with a primary care physician if you do not have one) for your aftercare needs so that they can reassess your need for medications and monitor your lab values.

## 2016-04-02 NOTE — Progress Notes (Signed)
[facility] Progress Note   Subjective:  Abdominal pain improved, low appetite still. No CP or dyspnea. No fever or chills. No new symptoms.  Objective Vitals:   04/01/16 2025 04/01/16 2030 04/01/16 2106 04/02/16 0512  BP: (!) 153/91 (!) 156/97 (!) 156/101 (!) 151/84  Pulse: 78 77 79 71  Resp: 12  18 18   Temp: 97.8 F (36.6 C)  98.3 F (36.8 C) 97.8 F (36.6 C)  TempSrc:   Oral Oral  SpO2:   99% 100%  Weight: 57.4 kg (126 lb 8.7 oz)  59 kg (130 lb 1.6 oz)   Height:       Physical Exam General: Well appearing female, NAD. Heart: RRR; no murmur. Lungs: CTAB. Abdomen: soft, non-tender. Extremities: No LE edema. Dialysis Access: LUE AVF + thrill/bruit  Dialysis Orders: [facility] on MWF. 3:45 hours,  EDW 56kg,  2K/2.25Ca bath,  BFR 400/DFR A 1.5,  AVF - Heparin 6900 units bolus q HD - Hectoral 41mcg IV q HD  Additional Objective   Recent Labs Lab 03/31/16 1751 04/01/16 1651  NA 138 132*  K 4.6 5.2*  CL 94* 94*  CO2 27 25  GLUCOSE 76 85  BUN 49* 57*  CREATININE 12.62* 14.19*  CALCIUM 10.0 8.6*  PHOS  --  7.9*     Recent Labs Lab 03/31/16 1751 04/01/16 1651  AST 29  --   ALT 21  --   ALKPHOS 64  --   BILITOT 1.2  --   PROT 7.0  --   ALBUMIN 3.8 2.8*    Recent Labs Lab 03/31/16 1751  LIPASE 24     Recent Labs Lab 03/31/16 1751 04/01/16 1652  WBC 12.0* 7.8  HGB 11.4* 9.6*  HCT 33.7* 29.8*  MCV 93.1 94.9  PLT 160 137*     Studies/Results: Ct Abdomen Pelvis W Contrast  Result Date: 03/31/2016 CLINICAL DATA:  Upper abdominal pain and loss of appetite. EXAM: CT ABDOMEN AND PELVIS WITH CONTRAST TECHNIQUE: Multidetector CT imaging of the abdomen and pelvis was performed using the standard protocol following bolus administration of intravenous contrast. CONTRAST:  84mL ISOVUE-300 IOPAMIDOL (ISOVUE-300) INJECTION 61%, 42mL ISOVUE-300 IOPAMIDOL (ISOVUE-300) INJECTION 61% COMPARISON:  CT of the abdomen 10 06/2015 FINDINGS: Lower  chest: No acute abnormality. Hepatobiliary: No focal liver abnormality is seen. No gallstones, gallbladder wall thickening, or biliary dilatation. Pancreas: Unremarkable. No pancreatic ductal dilatation or surrounding inflammatory changes. Spleen: Normal in size without focal abnormality. Adrenals/Urinary Tract: Atrophic native kidneys. 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 Stomach/Bowel: The stomach is significantly distended. Mild irregular thickening of the pylorus is seen. The duodenum is normal in caliber. There is persistent diffuse mucosal thickening of multiple proximal small bowel loops with increased inflammatory mesenteric stranding and moderate amount of free fluid in the abdomen and pelvis. The more distal small bowel loops have normal appearance. The colon has normal appearance. No evidence of small-bowel obstruction. Vascular/Lymphatic: No significant vascular findings are present. No enlarged abdominal or pelvic lymph nodes. Reproductive: Uterus and bilateral adnexa are unremarkable. Other: No abdominal wall hernia or abnormality. No abdominopelvic ascites. Musculoskeletal: No acute or significant osseous findings. IMPRESSION: Persistent prominent mucosal thickening of the proximal small bowel with increased inflammatory mesenteric stranding and moderate amount of free fluid in the abdomen and pelvis. Consideration of posttransplant lymphoproliferative disorder versus acute infectious or inflammatory enteritis persists. Interval development of moderate gastric distention with irregular thickening of the pylorus, likely causing partial gastric outlet obstruction, and likely related to  the same pathologic process. 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 Electronically Signed   By: Fidela Salisbury M.D.   On: 03/31/2016 21:51   Medications: . sodium chloride     . amLODipine  10 mg Oral Daily  . calcium acetate  2,001 mg Oral TID WC  . cloNIDine   0.1 mg Oral BID  . heparin  5,000 Units Subcutaneous Q8H  . labetalol  200 mg Oral BID  . losartan  50 mg Oral QPM  . multivitamin  1 tablet Oral QHS  . pantoprazole  40 mg Oral BID     Assessment/Plan:  1. Abdominal pain/Gastric ulcerations: Per primary and GI. On PO protonix BID. Gastric biopsies pending. Negative 2. 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 Per H&P, there was some concern about possible lymphoproliferative disorder due to her immunosuppressants. Her usual nephrologist had already been slowly tapering her off immunosuppressants following failed kidney transplant, only thing left was low dose prograf, now stopped 10/9. 3. Hypertension/volume: BP slightly high. Continue outpatient meds. No edema. May need EDW raised. 4. Anemia: Hgb 11.4 -> 9.6; s/p EGD/biopsies. Start Aranesp 45mcg weekly. 5. Metabolic bone disease: Ca 8.6, Phos 7.9. Continue binders/VDRA for now. 6. Nutrition:  Albumin 3.8. Continue high protein diet once allowed to eat solid foods (on clears currently).  Veneta Penton, PA-C 04/02/2016, 9:30 AM  [facility] Pager: [phone]  I have seen and examined this patient and agree with plan and assessment in the above note with renal recommendations/intervention highlighted.  Tolerating solid diet. Gastric biopsies negative. Anticipate home next day or so. Would be due for next HD tomorrow.  Nafeesah Lapaglia B,MD 04/02/2016 2:05 PM

## 2016-04-02 NOTE — Discharge Summary (Signed)
Kerry Cook, is a 30 y.o. female  DOB [birth year]  MRN [id number].  Admission date:  03/31/2016  Admitting Physician  Etta Quill, DO  Discharge Date:  04/02/2016   Primary MD  [name], FNP  Recommendations for primary care physician for things to follow:  - Continue hemodialysis per schedule on Monday/Tuesday/Friday  Admission Diagnosis  Abdominal Pain  duodenal inflammation with partial GOO.  abdominal pain.   Discharge Diagnosis  Abdominal Pain  duodenal inflammation with partial GOO.  abdominal pain.    Principal Problem:   Gastric outlet obstruction Active Problems:   End stage renal disease on dialysis [facility])   Hypertension   Abnormal CT scan, small bowel   Multiple gastric ulcers      Past Medical History:  Diagnosis Date  . Chronic kidney disease    previous hx dialysis  . Dialysis patient ([place])   . History of kidney transplant 2012   kidney failure due to hypertension  . Hypertension     Past Surgical History:  Procedure Laterality Date  . ESOPHAGOGASTRODUODENOSCOPY N/A 04/01/2016   Procedure: ESOPHAGOGASTRODUODENOSCOPY (EGD);  Surgeon: Gatha Mayer, MD;  Location: [facility] ENDOSCOPY;  Service: Endoscopy;  Laterality: N/A;  . KIDNEY TRANSPLANT Bilateral 2012       History of present illness and  Hospital Course:     Kindly see H&P for history of present illness and admission details, please review complete Labs, Consult reports and Test reports for all details in brief  HPI  from the history and physical done on the day of admission 03/31/2016  HPI: [name] is a 30 y.o. female with medical history significant of ESRD, failed renal transplant.  ESRD in setting of HTN.  Patient presents to the ED with c/o upper abdominal pain.  This onset a couple of weeks ago but has been gradually worsening.  She was seen in the ED on the 1st of the month and had CT  abd/pelvis done at that time which showed inflammation of duodenum.  Her pain had resolved over the past week until it recurred this morning.  One does of home pain meds didn't really help pain.  ED Course: Pain relieved with morphine, repeat CT scan now shows worsening inflammation of duodenum, to the point of causing gastric outlet obstruction which is now new.  There is concern for a post-transplant lymphoproliferative disorder.  Hospital Course  Areonna Bran McDanielis a 30 y.o.femalewith medical history significant of ESRD, failed renal transplant. ESRD in setting of HTN. Patient presents to the ED with c/o upper abdominal pain.  workup significant for gastric outlet obstruction with abnormal pylorus and proximal enteritis, went for endoscopy 04/01/2016, significant for nonbleeding prepyloric and pyloric superficial gastric ulcer with heaped up mucosa.  Gastric outlet obstruction - Patient presents with partial gastric outlet obstruction, abdominal pain and nausea, went for endoscopy 10/9, significant for nonbleeding superficial gastric ulcer in pyloric and prepyloric area with heaped up mucosa causing partial obstruction, pathology is benign, showing reactive gastropathy with mild  chronic inflammation no H pylori, no villous blunting. - Tolerating clear liquid diet yesterday, tolerating soft diet today, no nausea, no abdominal pain. - Will be discharged on Protonix 40 mg oral twice a day.  ESRD - Renal consulted,   Failed renal transplant - Prograf has been stopped on discharge after discussing with renal (has been gradually tapered off)  Hypertension - Continue with home medication    Discharge Condition:  Stable   Follow UP  Follow-up Information    [name], FNP .   Specialty:  Family Medicine Contact information: [address]. [place] [address] [phone]             Discharge Instructions  and  Discharge Medications      Discharge Instructions    Discharge instructions    Complete by:  As directed    ollow with Primary MD Hollis,Lachina M, FNP in 7 days   Get CBC, CMP, 2 view Chest X ray checked  by Primary MD next visit.    Activity: As tolerated with Full fall precautions use walker/cane & assistance as needed   Disposition Home    Diet: Renal modified 1200 mL fluid restriction , with feeding assistance and aspiration precautions.  For Heart failure patients - Check your Weight same time everyday, if you gain over 2 pounds, or you develop in leg swelling, experience more shortness of breath or chest pain, call your Primary MD immediately. Follow Cardiac Low Salt Diet and 1.5 lit/day fluid restriction.   On your next visit with your primary care physician please Get Medicines reviewed and adjusted.   Please request your Prim.MD to go over all Hospital Tests and Procedure/Radiological results at the follow up, please get all Hospital records sent to your Prim MD by signing hospital release before you go home.   If you experience worsening of your admission symptoms, develop shortness of breath, life threatening emergency, suicidal or homicidal thoughts you must seek medical attention immediately by calling 911 or calling your MD immediately  if symptoms less severe.  You Must read complete instructions/literature along with all the possible adverse reactions/side effects for all the Medicines you take and that have been prescribed to you. Take any new Medicines after you have completely understood and accpet all the possible adverse reactions/side effects.   Do not drive, operating heavy machinery, perform activities at heights, swimming or participation in water activities or provide baby sitting services if your were admitted for syncope or siezures until you have seen by Primary MD or a Neurologist and advised to do so again.  Do not drive when taking Pain medications.    Do not take more  than prescribed Pain, Sleep and Anxiety Medications  Special Instructions: If you have smoked or chewed Tobacco  in the last 2 yrs please stop smoking, stop any regular Alcohol  and or any Recreational drug use.  Wear Seat belts while driving.   Please note  You were cared for by a hospitalist during your hospital stay. If you have any questions about your discharge medications or the care you received while you were in the hospital after you are discharged, you can call the unit and asked to speak with the hospitalist on call if the hospitalist that took care of you is not available. Once you are discharged, your primary care physician will handle any further medical issues. Please note that NO REFILLS for any discharge medications will be authorized once you are discharged, as  it is imperative that you return to your primary care physician (or establish a relationship with a primary care physician if you do not have one) for your aftercare needs so that they can reassess your need for medications and monitor your lab values.   Increase activity slowly    Complete by:  As directed        Medication List    STOP taking these medications   tacrolimus 1 MG capsule Commonly known as:  PROGRAF     TAKE these medications   amLODipine 10 MG tablet Commonly known as:  NORVASC Take 1 tablet (10 mg total) by mouth every evening. What changed:  when to take this   calcium acetate 667 MG capsule Commonly known as:  PHOSLO Take 2,001 mg by mouth 3 (three) times daily with meals.   cloNIDine 0.1 MG tablet Commonly known as:  CATAPRES Take 0.1 mg by mouth 2 (two) times daily.   labetalol 200 MG tablet Commonly known as:  NORMODYNE Take 1 tablet (200 mg total) by mouth 2 (two) times daily.   losartan 50 MG tablet Commonly known as:  COZAAR Take 1 tablet (50 mg total) by mouth every evening.   multivitamin Tabs tablet Take 1 tablet by mouth at bedtime.   ondansetron 8 MG disintegrating  tablet Commonly known as:  ZOFRAN ODT Take 1 tablet (8 mg total) by mouth every 8 (eight) hours as needed for nausea or vomiting.   pantoprazole 40 MG tablet Commonly known as:  PROTONIX Take 1 tablet (40 mg total) by mouth 2 (two) times daily.         Diet and Activity recommendation: See Discharge Instructions above   Consults obtained -  Renal GI   Major procedures and Radiology Reports - PLEASE review detailed and final reports for all details, in brief -  10/9 EGD: Non-bleeding pre-pyloric and pyloric superficial gastric ulcers a/w heaped up mucosa.  Path is benign: reactive gastropathy, mild chronic inflammation, no villous blunting, no H Pylori.    Ct Abdomen Pelvis W Contrast  Result Date: 03/31/2016 CLINICAL DATA:  Upper abdominal pain and loss of appetite. EXAM: CT ABDOMEN AND PELVIS WITH CONTRAST TECHNIQUE: Multidetector CT imaging of the abdomen and pelvis was performed using the standard protocol following bolus administration of intravenous contrast. CONTRAST:  64mL ISOVUE-300 IOPAMIDOL (ISOVUE-300) INJECTION 61%, 85mL ISOVUE-300 IOPAMIDOL (ISOVUE-300) INJECTION 61% COMPARISON:  CT of the abdomen 10 06/2015 FINDINGS: Lower chest: No acute abnormality. Hepatobiliary: No focal liver abnormality is seen. No gallstones, gallbladder wall thickening, or biliary dilatation. Pancreas: Unremarkable. No pancreatic ductal dilatation or surrounding inflammatory changes. Spleen: Normal in size without focal abnormality. Adrenals/Urinary Tract: Atrophic native kidneys. 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 Stomach/Bowel: The stomach is significantly distended. Mild irregular thickening of the pylorus is seen. The duodenum is normal in caliber. There is persistent diffuse mucosal thickening of multiple proximal small bowel loops with increased inflammatory mesenteric stranding and moderate amount of free fluid in the abdomen and pelvis. The more distal small bowel loops  have normal appearance. The colon has normal appearance. No evidence of small-bowel obstruction. Vascular/Lymphatic: No significant vascular findings are present. No enlarged abdominal or pelvic lymph nodes. Reproductive: Uterus and bilateral adnexa are unremarkable. Other: No abdominal wall hernia or abnormality. No abdominopelvic ascites. Musculoskeletal: No acute or significant osseous findings. IMPRESSION: Persistent prominent mucosal thickening of the proximal small bowel with increased inflammatory mesenteric stranding and moderate amount of free fluid in the abdomen and  pelvis. Consideration of posttransplant lymphoproliferative disorder versus acute infectious or inflammatory enteritis persists. Interval development of moderate gastric distention with irregular thickening of the pylorus, likely causing partial gastric outlet obstruction, and likely related to the same pathologic process. 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 Electronically Signed   By: Fidela Salisbury M.D.   On: 03/31/2016 21:51   Ct Abdomen Pelvis W Contrast  Result Date: 03/24/2016 CLINICAL DATA:  Patient c/o epigastric pain that began today. Patient states that was nauseas and has had a small amount of diarrhea today. Patient now complains of sharp pain in her abdomen. EXAM: CT ABDOMEN AND PELVIS WITH CONTRAST TECHNIQUE: Multidetector CT imaging of the abdomen and pelvis was performed using the standard protocol following bolus administration of intravenous contrast. CONTRAST:  60mL ISOVUE-300 IOPAMIDOL (ISOVUE-300) INJECTION 61% COMPARISON:  CT abdomen dated 06/08/2015. FINDINGS: Lower chest: No acute abnormality. Hepatobiliary: No focal liver abnormality is seen. No gallstones, gallbladder wall thickening, or biliary dilatation. Pancreas: Unremarkable. No pancreatic ductal dilatation or surrounding inflammatory changes. Spleen: Normal in size without focal abnormality. Adrenals/Urinary Tract:  Native kidneys are atrophic bilaterally. The 2 right lower quadrant transplant kidneys now appear heavily calcified and likely atrophic, not well seen. Stomach/Bowel: There is extensive thickening of the walls of the small bowel in the upper abdomen and mid abdomen. Small bowel within the lower abdomen and pelvis has a relatively normal appearance. Large bowel is unremarkable. No dilated large or small bowel loops. Vascular/Lymphatic: No significant vascular findings are present. No enlarged abdominal or pelvic lymph nodes. Reproductive: Uterus and bilateral adnexa are unremarkable. Other: Moderate amount of free fluid throughout the abdomen and pelvis. No circumscribed fluid collection or abscess collection seen. Musculoskeletal: No acute or suspicious osseous lesion. Superficial soft tissues are unremarkable. IMPRESSION: 1. Prominent thickening of the walls of the small bowel throughout the upper abdomen and mid abdomen. Given the history of transplanted kidneys, this could represent post transplant lymphoproliferative disorder. Alternatively, findings could represent an acute enteritis of infectious or inflammatory nature. 2. Transplant kidneys within the right lower quadrant now appear heavily calcified, presumably atrophic, not well seen. Native kidneys are atrophic, similar in appearance to the previous exam. 3. Moderate amount of free fluid throughout the abdomen and pelvis. No circumscribed fluid collection or abscess collection seen. No free intraperitoneal air. These results were called by telephone at the time of interpretation on 03/24/2016 at 5:37 pm to Dr. Jola Schmidt , who verbally acknowledged these results. Electronically Signed   By: Franki Cabot M.D.   On: 03/24/2016 17:37    Micro Results     Recent Results (from the past 240 hour(s))  MRSA PCR Screening     Status: None   Collection Time: 04/01/16  1:46 AM  Result Value Ref Range Status   MRSA by PCR NEGATIVE NEGATIVE Final     Comment:        The GeneXpert MRSA Assay (FDA approved for NASAL specimens only), is one component of a comprehensive MRSA colonization surveillance program. It is not intended to diagnose MRSA infection nor to guide or monitor treatment for MRSA infections.        Today   Subjective:   Dreamer Carillo today has no headache,no chest abdominal pain,no new weakness tingling or numbness, feels much better wants to go home today.   Objective:   Blood pressure (!) 152/96, pulse 80, temperature 98.2 F (36.8 C), temperature source Oral, resp. rate 18, height 5' (1.524 m), weight  59 kg (130 lb 1.6 oz), last menstrual period 01/27/2016, SpO2 100 %, unknown if currently breastfeeding.   Intake/Output Summary (Last 24 hours) at 04/02/16 1445 Last data filed at 04/02/16 0900  Gross per 24 hour  Intake              120 ml  Output             3000 ml  Net            -2880 ml    Exam Awake Alert, Oriented x 3, No new F.N deficits, Normal affect [place].AT,PERRAL Supple Neck,No JVD, No cervical lymphadenopathy appriciated.  Symmetrical Chest wall movement, Good air movement bilaterally, CTAB RRR,No Gallops,Rubs or new Murmurs, No Parasternal Heave +ve B.Sounds, Abd Soft, Non tender, No organomegaly appriciated, No rebound -guarding or rigidity. No Cyanosis, Clubbing or edema, No new Rash or bruise  Data Review   CBC w Diff: Lab Results  Component Value Date   WBC 7.8 04/01/2016   HGB 9.6 (L) 04/01/2016   HCT 29.8 (L) 04/01/2016   PLT 137 (L) 04/01/2016   LYMPHOPCT 15 03/24/2016   MONOPCT 5 03/24/2016   EOSPCT 3 03/24/2016   BASOPCT 0 03/24/2016    CMP: Lab Results  Component Value Date   NA 132 (L) 04/01/2016   K 5.2 (H) 04/01/2016   CL 94 (L) 04/01/2016   CO2 25 04/01/2016   BUN 57 (H) 04/01/2016   CREATININE 14.19 (H) 04/01/2016   PROT 7.0 03/31/2016   ALBUMIN 2.8 (L) 04/01/2016   BILITOT 1.2 03/31/2016   ALKPHOS 64 03/31/2016   AST 29 03/31/2016   ALT 21  03/31/2016  .   Total Time in preparing paper work, data evaluation and todays exam - 35 minutes  ELGERGAWY, DAWOOD M.D on 04/02/2016 at 2:45 PM  [facility]   Office  [phone]

## 2016-04-02 NOTE — Progress Notes (Signed)
Pt discharged home per MD. Discharge instructions provided. NSL removed. Pt verbalized no questions. Bartholomew Crews, RN

## 2016-04-03 DIAGNOSIS — D631 Anemia in chronic kidney disease: Secondary | ICD-10-CM | POA: Diagnosis not present

## 2016-04-03 DIAGNOSIS — N2581 Secondary hyperparathyroidism of renal origin: Secondary | ICD-10-CM | POA: Diagnosis not present

## 2016-04-03 DIAGNOSIS — N186 End stage renal disease: Secondary | ICD-10-CM | POA: Diagnosis not present

## 2016-04-05 DIAGNOSIS — N186 End stage renal disease: Secondary | ICD-10-CM | POA: Diagnosis not present

## 2016-04-05 DIAGNOSIS — D631 Anemia in chronic kidney disease: Secondary | ICD-10-CM | POA: Diagnosis not present

## 2016-04-05 DIAGNOSIS — N2581 Secondary hyperparathyroidism of renal origin: Secondary | ICD-10-CM | POA: Diagnosis not present

## 2016-04-08 DIAGNOSIS — D631 Anemia in chronic kidney disease: Secondary | ICD-10-CM | POA: Diagnosis not present

## 2016-04-08 DIAGNOSIS — N2581 Secondary hyperparathyroidism of renal origin: Secondary | ICD-10-CM | POA: Diagnosis not present

## 2016-04-08 DIAGNOSIS — N186 End stage renal disease: Secondary | ICD-10-CM | POA: Diagnosis not present

## 2016-04-10 DIAGNOSIS — N2581 Secondary hyperparathyroidism of renal origin: Secondary | ICD-10-CM | POA: Diagnosis not present

## 2016-04-10 DIAGNOSIS — N186 End stage renal disease: Secondary | ICD-10-CM | POA: Diagnosis not present

## 2016-04-10 DIAGNOSIS — D631 Anemia in chronic kidney disease: Secondary | ICD-10-CM | POA: Diagnosis not present

## 2016-04-12 DIAGNOSIS — N2581 Secondary hyperparathyroidism of renal origin: Secondary | ICD-10-CM | POA: Diagnosis not present

## 2016-04-12 DIAGNOSIS — N186 End stage renal disease: Secondary | ICD-10-CM | POA: Diagnosis not present

## 2016-04-12 DIAGNOSIS — D631 Anemia in chronic kidney disease: Secondary | ICD-10-CM | POA: Diagnosis not present

## 2016-04-15 DIAGNOSIS — N186 End stage renal disease: Secondary | ICD-10-CM | POA: Diagnosis not present

## 2016-04-15 DIAGNOSIS — D631 Anemia in chronic kidney disease: Secondary | ICD-10-CM | POA: Diagnosis not present

## 2016-04-15 DIAGNOSIS — N2581 Secondary hyperparathyroidism of renal origin: Secondary | ICD-10-CM | POA: Diagnosis not present

## 2016-04-17 DIAGNOSIS — N186 End stage renal disease: Secondary | ICD-10-CM | POA: Diagnosis not present

## 2016-04-17 DIAGNOSIS — D631 Anemia in chronic kidney disease: Secondary | ICD-10-CM | POA: Diagnosis not present

## 2016-04-17 DIAGNOSIS — N2581 Secondary hyperparathyroidism of renal origin: Secondary | ICD-10-CM | POA: Diagnosis not present

## 2016-04-19 DIAGNOSIS — N186 End stage renal disease: Secondary | ICD-10-CM | POA: Diagnosis not present

## 2016-04-19 DIAGNOSIS — D631 Anemia in chronic kidney disease: Secondary | ICD-10-CM | POA: Diagnosis not present

## 2016-04-19 DIAGNOSIS — N2581 Secondary hyperparathyroidism of renal origin: Secondary | ICD-10-CM | POA: Diagnosis not present

## 2016-04-22 DIAGNOSIS — N2581 Secondary hyperparathyroidism of renal origin: Secondary | ICD-10-CM | POA: Diagnosis not present

## 2016-04-22 DIAGNOSIS — D631 Anemia in chronic kidney disease: Secondary | ICD-10-CM | POA: Diagnosis not present

## 2016-04-22 DIAGNOSIS — N186 End stage renal disease: Secondary | ICD-10-CM | POA: Diagnosis not present

## 2016-04-23 DIAGNOSIS — N186 End stage renal disease: Secondary | ICD-10-CM | POA: Diagnosis not present

## 2016-04-23 DIAGNOSIS — I120 Hypertensive chronic kidney disease with stage 5 chronic kidney disease or end stage renal disease: Secondary | ICD-10-CM | POA: Diagnosis not present

## 2016-04-23 DIAGNOSIS — Z992 Dependence on renal dialysis: Secondary | ICD-10-CM | POA: Diagnosis not present

## 2016-04-24 DIAGNOSIS — N2581 Secondary hyperparathyroidism of renal origin: Secondary | ICD-10-CM | POA: Diagnosis not present

## 2016-04-24 DIAGNOSIS — D631 Anemia in chronic kidney disease: Secondary | ICD-10-CM | POA: Diagnosis not present

## 2016-04-24 DIAGNOSIS — N186 End stage renal disease: Secondary | ICD-10-CM | POA: Diagnosis not present

## 2016-04-26 DIAGNOSIS — N2581 Secondary hyperparathyroidism of renal origin: Secondary | ICD-10-CM | POA: Diagnosis not present

## 2016-04-26 DIAGNOSIS — N186 End stage renal disease: Secondary | ICD-10-CM | POA: Diagnosis not present

## 2016-04-26 DIAGNOSIS — D631 Anemia in chronic kidney disease: Secondary | ICD-10-CM | POA: Diagnosis not present

## 2016-04-29 DIAGNOSIS — N2581 Secondary hyperparathyroidism of renal origin: Secondary | ICD-10-CM | POA: Diagnosis not present

## 2016-04-29 DIAGNOSIS — N186 End stage renal disease: Secondary | ICD-10-CM | POA: Diagnosis not present

## 2016-04-29 DIAGNOSIS — D631 Anemia in chronic kidney disease: Secondary | ICD-10-CM | POA: Diagnosis not present

## 2016-05-01 DIAGNOSIS — N2581 Secondary hyperparathyroidism of renal origin: Secondary | ICD-10-CM | POA: Diagnosis not present

## 2016-05-01 DIAGNOSIS — D631 Anemia in chronic kidney disease: Secondary | ICD-10-CM | POA: Diagnosis not present

## 2016-05-01 DIAGNOSIS — N186 End stage renal disease: Secondary | ICD-10-CM | POA: Diagnosis not present

## 2016-05-03 DIAGNOSIS — D631 Anemia in chronic kidney disease: Secondary | ICD-10-CM | POA: Diagnosis not present

## 2016-05-03 DIAGNOSIS — N2581 Secondary hyperparathyroidism of renal origin: Secondary | ICD-10-CM | POA: Diagnosis not present

## 2016-05-03 DIAGNOSIS — N186 End stage renal disease: Secondary | ICD-10-CM | POA: Diagnosis not present

## 2016-05-06 DIAGNOSIS — D631 Anemia in chronic kidney disease: Secondary | ICD-10-CM | POA: Diagnosis not present

## 2016-05-06 DIAGNOSIS — N186 End stage renal disease: Secondary | ICD-10-CM | POA: Diagnosis not present

## 2016-05-06 DIAGNOSIS — N2581 Secondary hyperparathyroidism of renal origin: Secondary | ICD-10-CM | POA: Diagnosis not present

## 2016-05-08 DIAGNOSIS — N2581 Secondary hyperparathyroidism of renal origin: Secondary | ICD-10-CM | POA: Diagnosis not present

## 2016-05-08 DIAGNOSIS — N186 End stage renal disease: Secondary | ICD-10-CM | POA: Diagnosis not present

## 2016-05-08 DIAGNOSIS — D631 Anemia in chronic kidney disease: Secondary | ICD-10-CM | POA: Diagnosis not present

## 2016-05-10 DIAGNOSIS — N2581 Secondary hyperparathyroidism of renal origin: Secondary | ICD-10-CM | POA: Diagnosis not present

## 2016-05-10 DIAGNOSIS — N186 End stage renal disease: Secondary | ICD-10-CM | POA: Diagnosis not present

## 2016-05-10 DIAGNOSIS — D631 Anemia in chronic kidney disease: Secondary | ICD-10-CM | POA: Diagnosis not present

## 2016-05-12 DIAGNOSIS — D631 Anemia in chronic kidney disease: Secondary | ICD-10-CM | POA: Diagnosis not present

## 2016-05-12 DIAGNOSIS — N2581 Secondary hyperparathyroidism of renal origin: Secondary | ICD-10-CM | POA: Diagnosis not present

## 2016-05-12 DIAGNOSIS — N186 End stage renal disease: Secondary | ICD-10-CM | POA: Diagnosis not present

## 2016-05-14 DIAGNOSIS — D631 Anemia in chronic kidney disease: Secondary | ICD-10-CM | POA: Diagnosis not present

## 2016-05-14 DIAGNOSIS — N2581 Secondary hyperparathyroidism of renal origin: Secondary | ICD-10-CM | POA: Diagnosis not present

## 2016-05-14 DIAGNOSIS — N186 End stage renal disease: Secondary | ICD-10-CM | POA: Diagnosis not present

## 2016-05-17 DIAGNOSIS — N2581 Secondary hyperparathyroidism of renal origin: Secondary | ICD-10-CM | POA: Diagnosis not present

## 2016-05-17 DIAGNOSIS — N186 End stage renal disease: Secondary | ICD-10-CM | POA: Diagnosis not present

## 2016-05-17 DIAGNOSIS — D631 Anemia in chronic kidney disease: Secondary | ICD-10-CM | POA: Diagnosis not present

## 2016-05-19 ENCOUNTER — Inpatient Hospital Stay (HOSPITAL_COMMUNITY)
Admission: EM | Admit: 2016-05-19 | Discharge: 2016-05-21 | DRG: 304 | Disposition: A | Discharge status: Home / Self Care | Payer: Medicare Other | Attending: Internal Medicine | Admitting: Internal Medicine

## 2016-05-19 ENCOUNTER — Encounter (HOSPITAL_COMMUNITY): Payer: Self-pay

## 2016-05-19 ENCOUNTER — Emergency Department (HOSPITAL_COMMUNITY): Payer: Medicare Other

## 2016-05-19 DIAGNOSIS — D631 Anemia in chronic kidney disease: Secondary | ICD-10-CM | POA: Diagnosis present

## 2016-05-19 DIAGNOSIS — I169 Hypertensive crisis, unspecified: Secondary | ICD-10-CM | POA: Diagnosis not present

## 2016-05-19 DIAGNOSIS — N186 End stage renal disease: Secondary | ICD-10-CM

## 2016-05-19 DIAGNOSIS — N189 Chronic kidney disease, unspecified: Secondary | ICD-10-CM | POA: Diagnosis not present

## 2016-05-19 DIAGNOSIS — Z8711 Personal history of peptic ulcer disease: Secondary | ICD-10-CM | POA: Diagnosis not present

## 2016-05-19 DIAGNOSIS — K259 Gastric ulcer, unspecified as acute or chronic, without hemorrhage or perforation: Secondary | ICD-10-CM | POA: Diagnosis not present

## 2016-05-19 DIAGNOSIS — R112 Nausea with vomiting, unspecified: Secondary | ICD-10-CM

## 2016-05-19 DIAGNOSIS — Z79899 Other long term (current) drug therapy: Secondary | ICD-10-CM

## 2016-05-19 DIAGNOSIS — I160 Hypertensive urgency: Principal | ICD-10-CM | POA: Diagnosis not present

## 2016-05-19 DIAGNOSIS — K2900 Acute gastritis without bleeding: Secondary | ICD-10-CM | POA: Diagnosis not present

## 2016-05-19 DIAGNOSIS — E8889 Other specified metabolic disorders: Secondary | ICD-10-CM | POA: Diagnosis present

## 2016-05-19 DIAGNOSIS — I120 Hypertensive chronic kidney disease with stage 5 chronic kidney disease or end stage renal disease: Secondary | ICD-10-CM | POA: Diagnosis present

## 2016-05-19 DIAGNOSIS — T8612 Kidney transplant failure: Secondary | ICD-10-CM | POA: Diagnosis present

## 2016-05-19 DIAGNOSIS — Z940 Kidney transplant status: Secondary | ICD-10-CM | POA: Diagnosis not present

## 2016-05-19 DIAGNOSIS — K279 Peptic ulcer, site unspecified, unspecified as acute or chronic, without hemorrhage or perforation: Secondary | ICD-10-CM | POA: Insufficient documentation

## 2016-05-19 DIAGNOSIS — Y830 Surgical operation with transplant of whole organ as the cause of abnormal reaction of the patient, or of later complication, without mention of misadventure at the time of the procedure: Secondary | ICD-10-CM | POA: Diagnosis present

## 2016-05-19 DIAGNOSIS — R1013 Epigastric pain: Secondary | ICD-10-CM | POA: Diagnosis not present

## 2016-05-19 DIAGNOSIS — E875 Hyperkalemia: Secondary | ICD-10-CM | POA: Diagnosis present

## 2016-05-19 DIAGNOSIS — N2581 Secondary hyperparathyroidism of renal origin: Secondary | ICD-10-CM | POA: Diagnosis present

## 2016-05-19 DIAGNOSIS — Z992 Dependence on renal dialysis: Secondary | ICD-10-CM | POA: Diagnosis not present

## 2016-05-19 DIAGNOSIS — I10 Essential (primary) hypertension: Secondary | ICD-10-CM | POA: Diagnosis present

## 2016-05-19 DIAGNOSIS — I129 Hypertensive chronic kidney disease with stage 1 through stage 4 chronic kidney disease, or unspecified chronic kidney disease: Secondary | ICD-10-CM | POA: Diagnosis not present

## 2016-05-19 LAB — CBC WITH DIFFERENTIAL/PLATELET
BASOS ABS: 0 10*3/uL (ref 0.0–0.1)
BASOS PCT: 0 %
EOS ABS: 0.2 10*3/uL (ref 0.0–0.7)
Eosinophils Relative: 2 %
HEMATOCRIT: 37.3 % (ref 36.0–46.0)
Hemoglobin: 12.4 g/dL (ref 12.0–15.0)
Lymphocytes Relative: 9 %
Lymphs Abs: 1 10*3/uL (ref 0.7–4.0)
MCH: 31.2 pg (ref 26.0–34.0)
MCHC: 33.2 g/dL (ref 30.0–36.0)
MCV: 94 fL (ref 78.0–100.0)
MONOS PCT: 7 %
Monocytes Absolute: 0.7 10*3/uL (ref 0.1–1.0)
NEUTROS ABS: 8.7 10*3/uL — AB (ref 1.7–7.7)
Neutrophils Relative %: 82 %
Platelets: 185 10*3/uL (ref 150–400)
RBC: 3.97 MIL/uL (ref 3.87–5.11)
RDW: 16.2 % — AB (ref 11.5–15.5)
WBC: 10.6 10*3/uL — ABNORMAL HIGH (ref 4.0–10.5)

## 2016-05-19 LAB — RENAL FUNCTION PANEL
ALBUMIN: 3.8 g/dL (ref 3.5–5.0)
ANION GAP: 18 — AB (ref 5–15)
BUN: 50 mg/dL — ABNORMAL HIGH (ref 6–20)
CALCIUM: 9.2 mg/dL (ref 8.9–10.3)
CO2: 20 mmol/L — ABNORMAL LOW (ref 22–32)
CREATININE: 13.62 mg/dL — AB (ref 0.44–1.00)
Chloride: 93 mmol/L — ABNORMAL LOW (ref 101–111)
GFR calc non Af Amer: 3 mL/min — ABNORMAL LOW (ref >60)
GFR, EST AFRICAN AMERICAN: 4 mL/min — AB (ref >60)
GLUCOSE: 67 mg/dL (ref 65–99)
PHOSPHORUS: 7.3 mg/dL — AB (ref 2.5–4.6)
Potassium: 6.6 mmol/L (ref 3.5–5.1)
SODIUM: 131 mmol/L — AB (ref 135–145)

## 2016-05-19 LAB — I-STAT BETA HCG BLOOD, ED (MC, WL, AP ONLY)

## 2016-05-19 LAB — COMPREHENSIVE METABOLIC PANEL
ALBUMIN: 4.1 g/dL (ref 3.5–5.0)
ALK PHOS: 69 U/L (ref 38–126)
ALT: 13 U/L — ABNORMAL LOW (ref 14–54)
ANION GAP: 16 — AB (ref 5–15)
AST: 29 U/L (ref 15–41)
BILIRUBIN TOTAL: 1 mg/dL (ref 0.3–1.2)
BUN: 47 mg/dL — AB (ref 6–20)
CO2: 25 mmol/L (ref 22–32)
Calcium: 9.7 mg/dL (ref 8.9–10.3)
Chloride: 94 mmol/L — ABNORMAL LOW (ref 101–111)
Creatinine, Ser: 12.55 mg/dL — ABNORMAL HIGH (ref 0.44–1.00)
GFR calc Af Amer: 4 mL/min — ABNORMAL LOW (ref >60)
GFR calc non Af Amer: 4 mL/min — ABNORMAL LOW (ref >60)
GLUCOSE: 77 mg/dL (ref 65–99)
POTASSIUM: 5.8 mmol/L — AB (ref 3.5–5.1)
SODIUM: 135 mmol/L (ref 135–145)
TOTAL PROTEIN: 7.5 g/dL (ref 6.5–8.1)

## 2016-05-19 LAB — CBC
HCT: 37.5 % (ref 36.0–46.0)
HEMOGLOBIN: 12.5 g/dL (ref 12.0–15.0)
MCH: 31.2 pg (ref 26.0–34.0)
MCHC: 33.3 g/dL (ref 30.0–36.0)
MCV: 93.5 fL (ref 78.0–100.0)
PLATELETS: 183 10*3/uL (ref 150–400)
RBC: 4.01 MIL/uL (ref 3.87–5.11)
RDW: 16.3 % — ABNORMAL HIGH (ref 11.5–15.5)
WBC: 11.4 10*3/uL — ABNORMAL HIGH (ref 4.0–10.5)

## 2016-05-19 LAB — LIPASE, BLOOD: Lipase: 20 U/L (ref 11–51)

## 2016-05-19 LAB — MRSA PCR SCREENING: MRSA BY PCR: NEGATIVE

## 2016-05-19 MED ORDER — LABETALOL HCL 5 MG/ML IV SOLN
20.0000 mg | Freq: Once | INTRAVENOUS | Status: DC
  Filled 2016-05-19: qty 4

## 2016-05-19 MED ORDER — LABETALOL HCL 200 MG PO TABS
200.0000 mg | ORAL_TABLET | Freq: Two times a day (BID) | ORAL | Status: DC
  Administered 2016-05-19 – 2016-05-21 (×5): 200 mg via ORAL
  Filled 2016-05-19 (×5): qty 1

## 2016-05-19 MED ORDER — HEPARIN SODIUM (PORCINE) 5000 UNIT/ML IJ SOLN
5000.0000 [IU] | Freq: Three times a day (TID) | INTRAMUSCULAR | Status: DC
  Administered 2016-05-19: 5000 [IU] via SUBCUTANEOUS
  Filled 2016-05-19 (×4): qty 1

## 2016-05-19 MED ORDER — HYDROMORPHONE HCL 1 MG/ML IJ SOLN
0.5000 mg | INTRAMUSCULAR | Status: AC | PRN
  Administered 2016-05-19 (×3): 0.5 mg via INTRAVENOUS
  Filled 2016-05-19 (×3): qty 1

## 2016-05-19 MED ORDER — ACETAMINOPHEN 650 MG RE SUPP: 650.0000 mg | Freq: Four times a day (QID) | RECTAL | Status: DC | PRN

## 2016-05-19 MED ORDER — PANTOPRAZOLE SODIUM 40 MG IV SOLR
40.0000 mg | Freq: Two times a day (BID) | INTRAVENOUS | Status: DC
  Administered 2016-05-19 – 2016-05-21 (×4): 40 mg via INTRAVENOUS
  Filled 2016-05-19 (×4): qty 40

## 2016-05-19 MED ORDER — ONDANSETRON HCL 4 MG/2ML IJ SOLN
4.0000 mg | Freq: Once | INTRAMUSCULAR | Status: AC
  Administered 2016-05-19: 4 mg via INTRAVENOUS
  Filled 2016-05-19: qty 2

## 2016-05-19 MED ORDER — HYDRALAZINE HCL 20 MG/ML IJ SOLN
10.0000 mg | Freq: Once | INTRAMUSCULAR | Status: AC
  Administered 2016-05-19: 10 mg via INTRAMUSCULAR
  Filled 2016-05-19: qty 1

## 2016-05-19 MED ORDER — HYDRALAZINE HCL 20 MG/ML IJ SOLN
10.0000 mg | Freq: Once | INTRAMUSCULAR | Status: AC
  Administered 2016-05-19: 10 mg via INTRAVENOUS
  Filled 2016-05-19: qty 1

## 2016-05-19 MED ORDER — SODIUM POLYSTYRENE SULFONATE 15 GM/60ML PO SUSP
45.0000 g | Freq: Once | ORAL | Status: AC
  Administered 2016-05-19: 45 g via ORAL
  Filled 2016-05-19: qty 180

## 2016-05-19 MED ORDER — ONDANSETRON HCL 4 MG/2ML IJ SOLN
4.0000 mg | INTRAMUSCULAR | Status: DC | PRN
  Administered 2016-05-19 – 2016-05-21 (×3): 4 mg via INTRAVENOUS
  Filled 2016-05-19 (×3): qty 2

## 2016-05-19 MED ORDER — HYDROMORPHONE HCL 1 MG/ML IJ SOLN
1.0000 mg | INTRAMUSCULAR | Status: DC | PRN
Start: 2016-05-19 — End: 2016-05-21
  Administered 2016-05-19 – 2016-05-21 (×8): 1 mg via INTRAVENOUS
  Filled 2016-05-19 (×9): qty 1

## 2016-05-19 MED ORDER — HYDRALAZINE HCL 20 MG/ML IJ SOLN
20.0000 mg | INTRAMUSCULAR | Status: DC | PRN
  Administered 2016-05-19 – 2016-05-20 (×3): 20 mg via INTRAVENOUS
  Filled 2016-05-19 (×2): qty 1

## 2016-05-19 MED ORDER — CLONIDINE HCL 0.1 MG PO TABS
0.1000 mg | ORAL_TABLET | Freq: Two times a day (BID) | ORAL | Status: DC
  Administered 2016-05-19 – 2016-05-21 (×5): 0.1 mg via ORAL
  Filled 2016-05-19 (×5): qty 1

## 2016-05-19 MED ORDER — ONDANSETRON HCL 4 MG PO TABS
4.0000 mg | ORAL_TABLET | Freq: Four times a day (QID) | ORAL | Status: DC | PRN
  Administered 2016-05-20 – 2016-05-21 (×2): 4 mg via ORAL
  Filled 2016-05-19 (×3): qty 1

## 2016-05-19 MED ORDER — SODIUM CHLORIDE 0.9 % IV SOLN
INTRAVENOUS | Status: DC
  Administered 2016-05-19: 12:00:00 via INTRAVENOUS

## 2016-05-19 MED ORDER — DOXERCALCIFEROL 4 MCG/2ML IV SOLN
5.0000 ug | INTRAVENOUS | Status: DC
  Administered 2016-05-20: 5 ug via INTRAVENOUS
  Filled 2016-05-19: qty 4

## 2016-05-19 MED ORDER — LABETALOL HCL 5 MG/ML IV SOLN
10.0000 mg | INTRAVENOUS | Status: DC | PRN
  Administered 2016-05-19 – 2016-05-20 (×2): 10 mg via INTRAVENOUS
  Filled 2016-05-19 (×2): qty 4

## 2016-05-19 MED ORDER — LABETALOL HCL 5 MG/ML IV SOLN
20.0000 mg | Freq: Once | INTRAVENOUS | Status: AC
  Administered 2016-05-19: 20 mg via INTRAVENOUS
  Filled 2016-05-19: qty 4

## 2016-05-19 MED ORDER — ACETAMINOPHEN 325 MG PO TABS: 650.0000 mg | ORAL_TABLET | Freq: Four times a day (QID) | ORAL | Status: DC | PRN

## 2016-05-19 NOTE — ED Triage Notes (Signed)
She c/o epigastric pain since yesterday evening. She cites hx of PUD and is on pantoprazole.

## 2016-05-19 NOTE — ED Notes (Signed)
I have attempted IV x 2 and our C.N. Has attempted u/s-guided IV x 2 without success. Dr. Tomi Bamberger is aware.

## 2016-05-19 NOTE — Progress Notes (Signed)
CRITICAL VALUE ALERT  Critical value received:  K+ 6.6  Date of notification:  11/26  Time of notification:  9:22pm  Critical value read back:Yes.    Nurse who received alert:  Steffanie Dunn   MD notified (1st page):  K. Sofia  Time of first page:  9:23  MD notified (2nd page):  Time of second page:  Responding MD:    Time MD responded:  Awaiting response

## 2016-05-19 NOTE — ED Notes (Signed)
She has just returned from x-ray.

## 2016-05-19 NOTE — ED Notes (Signed)
Hospitalist at bedside 

## 2016-05-19 NOTE — ED Provider Notes (Signed)
[place] DEPT Provider Note   CSN: [id number] Arrival date & time: 05/19/16  0850     History   Chief Complaint Chief Complaint  Patient presents with  . Abdominal Pain    HPI Kerry Cook is a 30 y.o. female.  The history is provided by the patient.  Abdominal Pain   This is a new problem. The current episode started 6 to 12 hours ago (4-5 hours after eating). The problem occurs constantly. The problem has been gradually worsening. The pain is located in the epigastric region. The quality of the pain is sharp. The pain is at a severity of 10/10. The pain is severe. Associated symptoms include nausea. Pertinent negatives include vomiting. Associated symptoms comments: Pt is on dialysis, last treatment on Friday, no issues . Nothing relieves the symptoms. Her past medical history is significant for PUD. Her past medical history does not include gallstones.  Last month she had a similar episode.  She was admitted to the hospital.  Diagnosed with duodenal ulcer and gastric outlet obstruction on endoscopy.  Pt has been taking protonix.  Past Medical History:  Diagnosis Date  . Chronic kidney disease    previous hx dialysis  . Dialysis patient ([place])   . History of kidney transplant 2012   kidney failure due to hypertension  . Hypertension   . Peptic ulcer     Patient Active Problem List   Diagnosis Date Noted  . Peptic ulcer   . Abnormal CT scan, small bowel   . Multiple gastric ulcers   . Gastric outlet obstruction 03/31/2016  . Medicare annual wellness visit, initial 10/31/2015  . ESRD (end stage renal disease) on dialysis ([place])   . Cephalalgia   . History of renal transplant   . Headache 06/07/2015  . Hypertensive urgency 02/16/2015  . Absolute anemia   . SOB (shortness of breath) 02/01/2015  . Hypertension   . History of kidney transplant   . Pneumonia 01/23/2015  . Tachycardia   . Symptomatic anemia 01/20/2015  . Supervision of high risk pregnancy,  antepartum 06/02/2014  . Anemia in chronic kidney disease 10/01/2007  . THROMBOCYTOPENIA 10/01/2007  . End stage renal disease on dialysis ([place]) 10/01/2007  . PAP SMEAR, LGSIL, ABNORMAL 10/01/2007  . HYPERTENSION, BENIGN SYSTEMIC 08/21/2006  . PROTEINURIA 08/21/2006    Past Surgical History:  Procedure Laterality Date  . ESOPHAGOGASTRODUODENOSCOPY N/A 04/01/2016   Procedure: ESOPHAGOGASTRODUODENOSCOPY (EGD);  Surgeon: Gatha Mayer, MD;  Location: [facility] ENDOSCOPY;  Service: Endoscopy;  Laterality: N/A;  . KIDNEY TRANSPLANT Bilateral 2012    OB History    Gravida Para Term Preterm AB Living   1       1 0   SAB TAB Ectopic Multiple Live Births   1               Home Medications    Prior to Admission medications   Medication Sig Start Date End Date Taking? Authorizing Provider  amLODipine (NORVASC) 10 MG tablet Take 1 tablet (10 mg total) by mouth every evening. Patient taking differently: Take 10 mg by mouth daily.  09/13/15  Yes [name], MD  calcium acetate (PHOSLO) 667 MG capsule Take 2,001 mg by mouth 3 (three) times daily with meals. 05/29/15  Yes Historical Provider, MD  cloNIDine (CATAPRES) 0.1 MG tablet Take 0.1 mg by mouth 2 (two) times daily.   Yes Historical Provider, MD  [employer] ANTI-ITCH lotion Apply 1 application topically daily as needed for itching. 05/08/16  Yes Historical Provider, MD  labetalol (NORMODYNE) 200 MG tablet Take 1 tablet (200 mg total) by mouth 2 (two) times daily. 04/02/16  Yes Albertine Patricia, MD  losartan (COZAAR) 50 MG tablet Take 1 tablet (50 mg total) by mouth every evening. 09/13/15  Yes [name], MD  ondansetron (ZOFRAN ODT) 8 MG disintegrating tablet Take 1 tablet (8 mg total) by mouth every 8 (eight) hours as needed for nausea or vomiting. 03/24/16  Yes Jola Schmidt, MD  pantoprazole (PROTONIX) 40 MG tablet Take 1 tablet (40 mg total) by mouth 2 (two) times daily. 04/02/16  Yes Albertine Patricia, MD  multivitamin (RENA-VIT) TABS  tablet Take 1 tablet by mouth at bedtime. Patient not taking: Reported on 05/19/2016 06/09/15   Debbe Odea, MD    Family History Family History  Problem Relation Age of Onset  . Family history unknown: Yes    Social History Social History  Substance Use Topics  . Smoking status: Never Smoker  . Smokeless tobacco: Never Used  . Alcohol use No     Allergies   Patient has no known allergies.   Review of Systems Review of Systems  Gastrointestinal: Positive for abdominal pain and nausea. Negative for vomiting.  All other systems reviewed and are negative.    Physical Exam Updated Vital Signs BP (!) 188/123   Pulse 113   Temp 97.7 F (36.5 C) (Oral)   Resp 19   Ht 5' (1.524 m)   Wt 56.7 kg   LMP 04/07/2016 (Exact Date)   SpO2 94%   BMI 24.41 kg/m   Physical Exam  Constitutional: She appears distressed.  HENT:  Head: Normocephalic and atraumatic.  Right Ear: External ear normal.  Left Ear: External ear normal.  Eyes: Conjunctivae are normal. Right eye exhibits no discharge. Left eye exhibits no discharge. No scleral icterus.  Neck: Neck supple. No tracheal deviation present.  Cardiovascular: Normal rate, regular rhythm and intact distal pulses.   Pulmonary/Chest: Effort normal and breath sounds normal. No stridor. No respiratory distress. She has no wheezes. She has no rales.  Abdominal: Soft. Bowel sounds are normal. She exhibits no distension. There is tenderness in the epigastric area. There is no rebound and no guarding.  Musculoskeletal: She exhibits no edema or tenderness.  Fistula left upper extremity  Neurological: She is alert. She has normal strength. No cranial nerve deficit (no facial droop, extraocular movements intact, no slurred speech) or sensory deficit. She exhibits normal muscle tone. She displays no seizure activity. Coordination normal.  Skin: Skin is warm and dry. No rash noted.  Psychiatric: She has a normal mood and affect.  Nursing note  and vitals reviewed.    ED Treatments / Results  Labs (all labs ordered are listed, but only abnormal results are displayed) Labs Reviewed  COMPREHENSIVE METABOLIC PANEL - Abnormal; Notable for the following:       Result Value   Potassium 5.8 (*)    Chloride 94 (*)    BUN 47 (*)    Creatinine, Ser 12.55 (*)    ALT 13 (*)    GFR calc non Af Amer 4 (*)    GFR calc Af Amer 4 (*)    Anion gap 16 (*)    All other components within normal limits  CBC WITH DIFFERENTIAL/PLATELET - Abnormal; Notable for the following:    WBC 10.6 (*)    RDW 16.2 (*)    Neutro Abs 8.7 (*)    All other components within  normal limits  LIPASE, BLOOD  CBC WITH DIFFERENTIAL/PLATELET  I-STAT BETA HCG BLOOD, ED ([facility], [facility], [facility] ONLY)    EKG  EKG Interpretation None       Radiology Dg Abdomen Acute W/chest  Result Date: 05/19/2016 CLINICAL DATA:  Epigastric pain EXAM: DG ABDOMEN ACUTE W/ 1V CHEST COMPARISON:  03/31/2016 FINDINGS: Borderline cardiomegaly. No acute infiltrate or pleural effusion. No pulmonary edema. There is normal small bowel gas pattern. Moderate stool noted in right colon. No evidence of free abdominal air. Again noted calcified transplanted kidney in right lower quadrant. IMPRESSION: Negative abdominal radiographs.  No acute cardiopulmonary disease. Electronically Signed   By: Lahoma Crocker M.D.   On: 05/19/2016 11:04    Procedures Procedures (including critical care time)  Medications Ordered in ED Medications  0.9 %  sodium chloride infusion ( Intravenous New Bag/Given 05/19/16 1224)  labetalol (NORMODYNE) tablet 200 mg (200 mg Oral Given 05/19/16 1251)  cloNIDine (CATAPRES) tablet 0.1 mg (0.1 mg Oral Given 05/19/16 1151)  labetalol (NORMODYNE,TRANDATE) injection 20 mg (not administered)  HYDROmorphone (DILAUDID) injection 0.5 mg (0.5 mg Intravenous Given 05/19/16 1326)  ondansetron (ZOFRAN) injection 4 mg (4 mg Intravenous Given 05/19/16 1005)  hydrALAZINE (APRESOLINE) injection 10  mg (10 mg Intramuscular Given 05/19/16 1054)  hydrALAZINE (APRESOLINE) injection 10 mg (10 mg Intravenous Given 05/19/16 1327)     Initial Impression / Assessment and Plan / ED Course  I have reviewed the triage vital signs and the nursing notes.  Pertinent labs & imaging results that were available during my care of the patient were reviewed by me and considered in my medical decision making (see chart for details).  Clinical Course as of May 19 1408  Nancy Fetter May 19, 2016  1010 Difficulty gettting IV access.  Will give dose of IM meds  [JK]  8657 Pt is feeling better.  IV access was obtained.  She is now taking some oral fluids  [JK]  1244 BP still elevated.  Will give another dose of hydralazine.  Pt is waiting for her oral labetalol (usual home med)  [JK]  1406 Vomiting has improved however she still has an extremely elevated BP despite several doses of meds.  Suspect her sx are related to her inability to keep down her po meds.  Pt has been compliant with dialysis and is due tomorrow.  Will consult for admission, further treatment  [JK]    Clinical Course User Index [JK] Dorie Rank, MD    Final Clinical Impressions(s) / ED Diagnoses   Final diagnoses:  Nausea and vomiting, intractability of vomiting not specified, unspecified vomiting type  Hypertensive urgency, malignant  Chronic renal failure, unspecified CKD stage     Dorie Rank, MD 05/19/16 1410

## 2016-05-19 NOTE — ED Notes (Signed)
Patient requesting pain medication. MD made aware. 

## 2016-05-19 NOTE — H&P (Signed)
[facility] H&P   Patient Demographics:    Kerry Cook, is a 30 y.o. female  MRN: [id number]   DOB - 01-Nov-1985  Admit Date - 05/19/2016  Outpatient Primary MD for the patient is [name], FNP  Referring MD: Marye Round  Outpatient Specialists: Dr. Mercy Moore  Patient coming from: Home  Chief Complaint  Patient presents with  . Abdominal Pain      HPI:    Kerry Cook  is a 30 y.o. female, With history of ESRD on dialysis (M, W, F), failed renal transplant, uncontrolled hypertension who was hospitalized 6 weeks back with partial gastric outlet obstruction secondary to superficial nonbleeding gastric ulcer in the pyloric and prepyloric area with heaped up mucus causing partial obstruction. Patient informs she was in her use a state of health until yesterday when she started having acute onset epigastric pain which was sharp and severe, nonradiating, without aggravating or relieving factors. Denies any fever, nausea or vomiting at home. Denies any sick contact or recent travel. Denies any chest pain, shortness of breath or diarrhea. (Had one regular bowel movement this morning). Patient informs that her epigastric pain is somewhat similar to her presentation 6 weeks back. She reports that she has been regular with her dialysis and her last dialysis was on 11/24. She does report eating some tacos outside last evening. Because of severe pain she was unable to take her home blood pressure medications and came to the ED.  In the ED she was found to be in hypertensive crisis with blood pressure as high as 216/147 mmHg, was tachycardic. Blood work done showed a WBC of 10.6, normal hemoglobin and platelets. Chemistry showed sodium of 135, keel 5.8, BUN of 47 and creatinine of 12.5. LFTs and lipase were normal. Abdominal x-ray was unremarkable for obstruction.   In the ED she  received some IV fluids followed by IV hydralazine 10 mg 1, labetalol 200 mg by mouth and clonidine 0.1 mg (her home dose). She didn't receive 20 mg of IV labetalol after which her blood pressure slightly improved but was still high (197/110 mmHg). She had an episode of nausea with vomiting in the ED (first time since her symptoms started yesterday). She was given 4 mg of IV Zofran and hospitalist admission requested to telemetry.    Review of systems:    In addition to the HPI above,  No Fever-chills, No Headache, No changes with Vision or hearing, No problems swallowing food or Liquids, No Chest pain, Cough or Shortness of Breath,  Abdominal pain+++,  Nausea and Vomiting+, Bowel movements are regular, No Blood in stool or Urine, No new skin rashes or bruises, No new joints pains-aches,  No new weakness, tingling, numbness in any extremity, No recent weight gain or loss, No polyuria, polydypsia or polyphagia, no dysuria No significant Mental Stressors.  A full 10 point Review of Systems  was done, except as stated above, all other Review of Systems were negative.   With Past History of the following :    Past Medical History:  Diagnosis Date  . Chronic kidney disease    previous hx dialysis  . Dialysis patient ([place])   . History of kidney transplant 2012   kidney failure due to hypertension  . Hypertension   . Peptic ulcer       Past Surgical History:  Procedure Laterality Date  . ESOPHAGOGASTRODUODENOSCOPY N/A 04/01/2016   Procedure: ESOPHAGOGASTRODUODENOSCOPY (EGD);  Surgeon: Gatha Mayer, MD;  Location: [facility] ENDOSCOPY;  Service: Endoscopy;  Laterality: N/A;  . KIDNEY TRANSPLANT Bilateral 2012      Social History:     Social History  Substance Use Topics  . Smoking status: Never Smoker  . Smokeless tobacco: Never Used  . Alcohol use No     Lives - At home  Mobility - ambulatory    Family History :     Family History  Problem Relation Age of Onset  .  Family history unknown: Yes   No family history of dialysis, heart disease   Home Medications:   Prior to Admission medications   Medication Sig Start Date End Date Taking? Authorizing Provider  amLODipine (NORVASC) 10 MG tablet Take 1 tablet (10 mg total) by mouth every evening. Patient taking differently: Take 10 mg by mouth daily.  09/13/15  Yes [name], MD  calcium acetate (PHOSLO) 667 MG capsule Take 2,001 mg by mouth 3 (three) times daily with meals. 05/29/15  Yes Historical Provider, MD  cloNIDine (CATAPRES) 0.1 MG tablet Take 0.1 mg by mouth 2 (two) times daily.   Yes Historical Provider, MD  [employer] ANTI-ITCH lotion Apply 1 application topically daily as needed for itching. 05/08/16  Yes Historical Provider, MD  labetalol (NORMODYNE) 200 MG tablet Take 1 tablet (200 mg total) by mouth 2 (two) times daily. 04/02/16  Yes Albertine Patricia, MD  losartan (COZAAR) 50 MG tablet Take 1 tablet (50 mg total) by mouth every evening. 09/13/15  Yes [name], MD  ondansetron (ZOFRAN ODT) 8 MG disintegrating tablet Take 1 tablet (8 mg total) by mouth every 8 (eight) hours as needed for nausea or vomiting. 03/24/16  Yes Jola Schmidt, MD  pantoprazole (PROTONIX) 40 MG tablet Take 1 tablet (40 mg total) by mouth 2 (two) times daily. 04/02/16  Yes Albertine Patricia, MD  multivitamin (RENA-VIT) TABS tablet Take 1 tablet by mouth at bedtime. Patient not taking: Reported on 05/19/2016 06/09/15   Debbe Odea, MD     Allergies:    No Known Allergies   Physical Exam:   Vitals  Blood pressure (!) 197/111, pulse 111, temperature 97.7 F (36.5 C), temperature source Oral, resp. rate 22, height 5' (1.524 m), weight 56.7 kg (125 lb), last menstrual period 04/07/2016, SpO2 96 %, unknown if currently breastfeeding.   Young female lying in bed appears fatigued HEENT: No pallor, Dry mucosa, supple neck Chest: Clear to auscultation bilaterally CVS: S1 and S2 tachycardic, no murmurs rub or  gallop GI: Soft, nondistended, bowel sounds present, epigastric tenderness to palpation Musculoskeletal: Warm, no edema, left upper extremity AV fistula CNS: Alert and oriented     Data Review:    CBC  Recent Labs Lab 05/19/16 0945 05/19/16 1055  WBC SPECIMEN CLOTTED 10.6*  HGB SPECIMEN CLOTTED 12.4  HCT SPECIMEN CLOTTED 37.3  PLT SPECIMEN CLOTTED 185  MCV SPECIMEN CLOTTED 94.0  MCH SPECIMEN CLOTTED 31.2  MCHC SPECIMEN CLOTTED 33.2  RDW SPECIMEN CLOTTED 16.2*  LYMPHSABS SPECIMEN CLOTTED 1.0  MONOABS SPECIMEN CLOTTED 0.7  EOSABS SPECIMEN CLOTTED 0.2  BASOSABS SPECIMEN CLOTTED 0.0   ------------------------------------------------------------------------------------------------------------------  Chemistries   Recent Labs Lab 05/19/16 0945  NA 135  K 5.8*  CL 94*  CO2 25  GLUCOSE 77  BUN 47*  CREATININE 12.55*  CALCIUM 9.7  AST 29  ALT 13*  ALKPHOS 69  BILITOT 1.0   ------------------------------------------------------------------------------------------------------------------ estimated creatinine clearance is 5.2 mL/min (by C-G formula based on SCr of 12.55 mg/dL (H)). ------------------------------------------------------------------------------------------------------------------ No results for input(s): TSH, T4TOTAL, T3FREE, THYROIDAB in the last 72 hours.  Invalid input(s): FREET3  Coagulation profile No results for input(s): INR, PROTIME in the last 168 hours. ------------------------------------------------------------------------------------------------------------------- No results for input(s): DDIMER in the last 72 hours. -------------------------------------------------------------------------------------------------------------------  Cardiac Enzymes No results for input(s): CKMB, TROPONINI, MYOGLOBIN in the last 168 hours.  Invalid input(s):  CK ------------------------------------------------------------------------------------------------------------------ No results found for: BNP   ---------------------------------------------------------------------------------------------------------------  Urinalysis    Component Value Date/Time   COLORURINE YELLOW 01/21/2015 1343   APPEARANCEUR HAZY (A) 01/21/2015 1343   LABSPEC 1.009 01/21/2015 1343   PHURINE 8.5 (H) 01/21/2015 1343   GLUCOSEU 100 (A) 01/21/2015 1343   HGBUR SMALL (A) 01/21/2015 1343   BILIRUBINUR NEGATIVE 01/21/2015 1343   KETONESUR 15 (A) 01/21/2015 1343   PROTEINUR >300 (A) 01/21/2015 1343   UROBILINOGEN 0.2 01/21/2015 1343   NITRITE NEGATIVE 01/21/2015 1343   LEUKOCYTESUR NEGATIVE 01/21/2015 1343    ----------------------------------------------------------------------------------------------------------------   Imaging Results:    Dg Abdomen Acute W/chest  Result Date: 05/19/2016 CLINICAL DATA:  Epigastric pain EXAM: DG ABDOMEN ACUTE W/ 1V CHEST COMPARISON:  03/31/2016 FINDINGS: Borderline cardiomegaly. No acute infiltrate or pleural effusion. No pulmonary edema. There is normal small bowel gas pattern. Moderate stool noted in right colon. No evidence of free abdominal air. Again noted calcified transplanted kidney in right lower quadrant. IMPRESSION: Negative abdominal radiographs.  No acute cardiopulmonary disease. Electronically Signed   By: Lahoma Crocker M.D.   On: 05/19/2016 11:04    My personal review of EKG: RNormal sinus rhythm at 91, J-point elevation in anterior leads, T-wave inversion in V5 and V6.   Assessment & Plan:    Principal problem Hypertensive crisis Likely associated with epigastric pain and unable to take her blood pressure medications this morning. Blood pressure slowly improving with medications in the ED. Admit to telemetry. Resume home dose clonidine and labetalol if tolerated by mouth.  add when necessary IV labetalol and  hydralazine. If unimproved or worse and may need nitroglycerin side.   Active Problems:  Epigastric pain relieved history of gastric ulcers. -Recent hospitalization for possible gastric outlet obstruction. Symptoms are more related to pain and this time with one episode of vomiting. Keep nothing by mouth. Supportive care with IV Zofran and Phenergan.-Serial abdominal exam. (Abdomen is nondistended density no signs of bowel obstruction, had regular bowel movement this morning. She does not need CT abdomen at present). -IV PPI twice a day. If symptoms aren't improving or worsen may need CT scan of the abdomen and GI consult.    End stage renal disease on dialysis [facility]) On M,W,F. Compliant with regular dialysis. Last dialysis on 11/24. No signs of volume overload. I have spoken with nephrology Dr. Florene Glen that patient would be coming to [facility] and needs her scheduled dialysis tomorrow.  Hyperkalemia No EKG changes. Should be corrected with dialysis tomorrow. Monitor renal function in the morning.   Patient will  be transferred to [facility] for further management and need for dialysis tomorrow. Signed out to my partner Dr. Marily Memos.  DVT Prophylaxis Heparin -  AM Labs Ordered, also please review Full Orders  Family Communication: Admission, patients condition and plan of care including tests being ordered have been discussed with the patient and her family at bedside  Code Status full code  Likely DC to  home  Condition GUARDED   Consults called: [place] [facility]   Admission status: Inpatient   Time spent in minutes : 70   Louellen Molder M.D on 05/19/2016 at 2:57 PM  Between 7am to 7pm - Pager - [phone]. After 7pm go to [email or web address] - password [facility]  [facility] - Office  [phone]

## 2016-05-19 NOTE — ED Notes (Signed)
[facility] called for transport. 

## 2016-05-20 LAB — BASIC METABOLIC PANEL
Anion gap: 21 — ABNORMAL HIGH (ref 5–15)
BUN: 65 mg/dL — AB (ref 6–20)
CHLORIDE: 94 mmol/L — AB (ref 101–111)
CO2: 22 mmol/L (ref 22–32)
CREATININE: 15.52 mg/dL — AB (ref 0.44–1.00)
Calcium: 9.7 mg/dL (ref 8.9–10.3)
GFR calc Af Amer: 3 mL/min — ABNORMAL LOW (ref >60)
GFR calc non Af Amer: 3 mL/min — ABNORMAL LOW (ref >60)
Glucose, Bld: 81 mg/dL (ref 65–99)
Potassium: 7.2 mmol/L (ref 3.5–5.1)
SODIUM: 137 mmol/L (ref 135–145)

## 2016-05-20 LAB — CBC
HCT: 36.8 % (ref 36.0–46.0)
HEMOGLOBIN: 11.9 g/dL — AB (ref 12.0–15.0)
MCH: 30.4 pg (ref 26.0–34.0)
MCHC: 32.3 g/dL (ref 30.0–36.0)
MCV: 94.1 fL (ref 78.0–100.0)
Platelets: 184 10*3/uL (ref 150–400)
RBC: 3.91 MIL/uL (ref 3.87–5.11)
RDW: 16.8 % — AB (ref 11.5–15.5)
WBC: 10.9 10*3/uL — ABNORMAL HIGH (ref 4.0–10.5)

## 2016-05-20 LAB — ALBUMIN: Albumin: 3.7 g/dL (ref 3.5–5.0)

## 2016-05-20 LAB — PHOSPHORUS: Phosphorus: 7.7 mg/dL — ABNORMAL HIGH (ref 2.5–4.6)

## 2016-05-20 MED ORDER — LIDOCAINE HCL (PF) 1 % IJ SOLN: 5.0000 mL | INTRAMUSCULAR | Status: DC | PRN

## 2016-05-20 MED ORDER — PENTAFLUOROPROP-TETRAFLUOROETH EX AERO: 1.0000 "application " | INHALATION_SPRAY | CUTANEOUS | Status: DC | PRN

## 2016-05-20 MED ORDER — RENA-VITE PO TABS
1.0000 | ORAL_TABLET | Freq: Every day | ORAL | Status: DC
  Administered 2016-05-20: 1 via ORAL
  Filled 2016-05-20: qty 1

## 2016-05-20 MED ORDER — SODIUM CHLORIDE 0.9 % IV SOLN: 100.0000 mL | INTRAVENOUS | Status: DC | PRN

## 2016-05-20 MED ORDER — CALCIUM ACETATE (PHOS BINDER) 667 MG PO CAPS
2001.0000 mg | ORAL_CAPSULE | Freq: Three times a day (TID) | ORAL | Status: DC
  Administered 2016-05-21 (×2): 2001 mg via ORAL
  Filled 2016-05-20 (×2): qty 3

## 2016-05-20 MED ORDER — DOXERCALCIFEROL 4 MCG/2ML IV SOLN
INTRAVENOUS | Status: AC
  Filled 2016-05-20: qty 4

## 2016-05-20 MED ORDER — HEPARIN SODIUM (PORCINE) 1000 UNIT/ML DIALYSIS: 1000.0000 [IU] | INTRAMUSCULAR | Status: DC | PRN

## 2016-05-20 MED ORDER — HYDRALAZINE HCL 20 MG/ML IJ SOLN
INTRAMUSCULAR | Status: AC
  Filled 2016-05-20: qty 1

## 2016-05-20 MED ORDER — LIDOCAINE-PRILOCAINE 2.5-2.5 % EX CREA: 1.0000 "application " | TOPICAL_CREAM | CUTANEOUS | Status: DC | PRN

## 2016-05-20 MED ORDER — HYDRALAZINE HCL 20 MG/ML IJ SOLN
20.0000 mg | INTRAMUSCULAR | Status: DC | PRN
  Administered 2016-05-20: 20 mg via INTRAVENOUS
  Filled 2016-05-20: qty 1

## 2016-05-20 NOTE — Progress Notes (Signed)
First attempt to call [facility] to have pt on list as priority. Will call again

## 2016-05-20 NOTE — Procedures (Signed)
  I was present at this dialysis session, have reviewed the session itself and made  appropriate changes Kelly Splinter MD [place] pager [phone]   05/20/2016, 10:26 AM

## 2016-05-20 NOTE — Progress Notes (Signed)
Spoke with RN from [facility]- stated that the patient should be going to [facility] between 7-8am this morning. Will continue to monitor patient

## 2016-05-20 NOTE — Progress Notes (Signed)
Spoke with MD about patient going to [facility] in the morning. Pressure is still elevated but RN told to give more hydralazine only if patient is going to [facility] later than early morning. Labetalol not be given anymore this morning due to [facility]. Pt will be monitored and MD will be call for any changes or systolic >217.

## 2016-05-20 NOTE — Progress Notes (Signed)
Pt. has K+ level of 7.2, Nephrologist ordered 1K bath for 2hrs and 2K bath for the rest of tx.

## 2016-05-20 NOTE — Progress Notes (Signed)
PROGRESS NOTE    Kerry Cook  UJW:[id number] DOB: 03-Jul-1985 DOA: 05/19/2016 PCP: [name], FNP   Subjective: Patient is doing better today. Her abdominal pain has significantly improved.   Brief Narrative:  Kerry Cook  is a 30 y.o. female, With history of ESRD on dialysis (M, W, F), failed renal transplant, uncontrolled hypertension who was hospitalized 6 weeks back with partial gastric outlet obstruction secondary to superficial nonbleeding gastric ulcer in the pyloric and prepyloric area with heaped up mucus causing partial obstruction. She originally presented to the [facility] ED on 11/26 for acute epigastric pain which she states feels similar to her presentation 6 weeks ago. She believes her pain was triggered by eating Janine Limbo. Due to severe pain, the patient was unable to take her BP medications. She was found to be in hypertensive crisis in the ED with a BP of 216/147. She was given IV hydralazine, labetalol, and clonidine. WBC of 10.6, normal hemoglobin and platelets. Chemistry showed sodium of 135, k+ 5.8, BUN of 47 and creatinine of 12.5. LFTs and lipase were normal. Abdominal x-ray was unremarkable for obstruction.   Assessment & Plan:   Active Problems:   End stage renal disease on dialysis [facility] [address])   Hypertensive urgency   ESRD (end stage renal disease) on dialysis [facility])   Multiple gastric ulcers   Malignant hypertension   Acute gastritis without hemorrhage   Hyperkalemia   Hypertensive crisis   Hypertensive crisis -BP was 216/147 in the ED, currently at 186/118 with ED medications -Patient was unable to take her BP medications last night due to epigastric pain -Resume clonidine and labetalol if tolerated by mouth and IV labetalol and hydralazine prn.  Epigastric pain relieved history of gastric ulcers. -Hospitalization 6 weeks ago for possible gastric outlet obstruction.  -Presented with epigastric pain and one episode of vomiting but this has  improved -Serial abdominal XR is negative -Keep NPO. Supportive care with IV Zofran and Phenergan -IV PPI twice a day. -Consider CT scan of the abdomen and GI consult if symptoms return or worsen  End stage renal disease on dialysis ([address]) -On M,W,F. Compliant with regular dialysis. Last dialysis on 11/24.  -No signs of volume overload. -Followed by Dr. Florene Glen   Hyperkalemia -Potassium 5.8 yesterday and 7.2 today prior to HD -ST segment tenting on the EKG. Check BMP in AM -Continue to monitor renal function.  Anemia   - HGB = 11.9   - Last ESA= (Mircera 50 mcg on 05/12/16 )  - Will follow  Metabolic bone disease  - Iv Hectorol /binder with meals (noted op nonadherent  To diet binders ) Phoslo 3 ac .  DVT prophylaxis: Heparin Code Status: Full code Family Communication: None at bedside Disposition Plan: Remain inpatient   Consultants:   Nephrology  Procedures:   HD on 11/27  Antimicrobials:   None    Objective: Vitals:   05/20/16 0900 05/20/16 0930 05/20/16 0945 05/20/16 1000  BP: (!) 184/115 (!) 184/115 (!) 185/108 (!) 192/114  Pulse: 96 (!) 104 (!) 102 (!) 104  Resp:      Temp:      TempSrc:      SpO2:      Weight:      Height:        Intake/Output Summary (Last 24 hours) at 05/20/16 1023 Last data filed at 05/20/16 0600  Gross per 24 hour  Intake  0 ml  Output                0 ml  Net                0 ml   Filed Weights   05/19/16 0855 05/19/16 2007 05/20/16 0715  Weight: 56.7 kg (125 lb) 59.9 kg (132 lb 1.6 oz) 57.9 kg (127 lb 10.3 oz)    Examination:  General exam: Appears calm and comfortable, receiving HD Respiratory system: Respiratory effort normal. Cardiovascular system: S1 & S2 heard, RRR. No JVD, murmurs, rubs, gallops or clicks. No pedal edema.  Gastrointestinal system: Abdomen is nondistended, soft and nontender. No organomegaly or masses felt. Normal bowel sounds heard. Central nervous system: Alert and oriented.  No focal neurological deficits. Skin: No rashes, lesions or ulcers Psychiatry: Judgement and insight appear normal. Mood & affect appropriate.     Data Reviewed: I have personally reviewed following labs and imaging studies  CBC:  Recent Labs Lab 05/19/16 0945 05/19/16 1055 05/19/16 1754 05/20/16 0628  WBC SPECIMEN CLOTTED 10.6* 11.4* 10.9*  NEUTROABS SPECIMEN CLOTTED 8.7*  --   --   HGB SPECIMEN CLOTTED 12.4 12.5 11.9*  HCT SPECIMEN CLOTTED 37.3 37.5 36.8  MCV SPECIMEN CLOTTED 94.0 93.5 94.1  PLT SPECIMEN CLOTTED 185 183 381   Basic Metabolic Panel:  Recent Labs Lab 05/19/16 0945 05/19/16 1754 05/20/16 0628 05/20/16 0757  NA 135 131* 137  --   K 5.8* 6.6* 7.2*  --   CL 94* 93* 94*  --   CO2 25 20* 22  --   GLUCOSE 77 67 81  --   BUN 47* 50* 65*  --   CREATININE 12.55* 13.62* 15.52*  --   CALCIUM 9.7 9.2 9.7  --   PHOS  --  7.3*  --  7.7*   GFR: Estimated Creatinine Clearance: 4.3 mL/min (by C-G formula based on SCr of 15.52 mg/dL (H)). Liver Function Tests:  Recent Labs Lab 05/19/16 0945 05/19/16 1754 05/20/16 0757  AST 29  --   --   ALT 13*  --   --   ALKPHOS 69  --   --   BILITOT 1.0  --   --   PROT 7.5  --   --   ALBUMIN 4.1 3.8 3.7    Recent Labs Lab 05/19/16 0945  LIPASE 20   No results for input(s): AMMONIA in the last 168 hours. Coagulation Profile: No results for input(s): INR, PROTIME in the last 168 hours. Cardiac Enzymes: No results for input(s): CKTOTAL, CKMB, CKMBINDEX, TROPONINI in the last 168 hours. BNP (last 3 results) No results for input(s): PROBNP in the last 8760 hours. HbA1C: No results for input(s): HGBA1C in the last 72 hours. CBG: No results for input(s): GLUCAP in the last 168 hours. Lipid Profile: No results for input(s): CHOL, HDL, LDLCALC, TRIG, CHOLHDL, LDLDIRECT in the last 72 hours. Thyroid Function Tests: No results for input(s): TSH, T4TOTAL, FREET4, T3FREE, THYROIDAB in the last 72 hours. Anemia  Panel: No results for input(s): VITAMINB12, FOLATE, FERRITIN, TIBC, IRON, RETICCTPCT in the last 72 hours. Sepsis Labs: No results for input(s): PROCALCITON, LATICACIDVEN in the last 168 hours.  Recent Results (from the past 240 hour(s))  MRSA PCR Screening     Status: None   Collection Time: 05/19/16  5:09 PM  Result Value Ref Range Status   MRSA by PCR NEGATIVE NEGATIVE Final    Comment:  The GeneXpert MRSA Assay (FDA approved for NASAL specimens only), is one component of a comprehensive MRSA colonization surveillance program. It is not intended to diagnose MRSA infection nor to guide or monitor treatment for MRSA infections.          Radiology Studies: Dg Abdomen Acute W/chest  Result Date: 05/19/2016 CLINICAL DATA:  Epigastric pain EXAM: DG ABDOMEN ACUTE W/ 1V CHEST COMPARISON:  03/31/2016 FINDINGS: Borderline cardiomegaly. No acute infiltrate or pleural effusion. No pulmonary edema. There is normal small bowel gas pattern. Moderate stool noted in right colon. No evidence of free abdominal air. Again noted calcified transplanted kidney in right lower quadrant. IMPRESSION: Negative abdominal radiographs.  No acute cardiopulmonary disease. Electronically Signed   By: Lahoma Crocker M.D.   On: 05/19/2016 11:04        Scheduled Meds: . cloNIDine  0.1 mg Oral BID  . doxercalciferol  5 mcg Intravenous Q M,W,F-HD  . heparin  5,000 Units Subcutaneous Q8H  . labetalol  200 mg Oral BID  . pantoprazole (PROTONIX) IV  40 mg Intravenous Q12H   Continuous Infusions:   LOS: 1 day    Time spent: [place], PA-S, wrote some of this note. [facility] Pager [phone]  If 7PM-7AM, please contact night-coverage [email or web address] Password [email or web address] 05/20/2016, 10:23 AM   Birdie Hopes Pager: [phone] 05/20/2016, 1:42 PM

## 2016-05-20 NOTE — Consult Note (Signed)
[facility] Renal Consultation Note  Indication for Consultation:  Management of ESRD/hemodialysis; anemia, hypertension/volume and secondary hyperparathyroidism  HPI: Kerry Cook is a 30 y.o. female with ESRD (failed Renal TX Not on meds) uncontrolled HTN , recent admit 10/08-10/10/17 Gastric outlet obstruction with  endoscopy 10/9= significant for nonbleeding superficial gastric ulcer in pyloric and prepyloric area with heaped up mucosa causing partial obstruction, pathology was benign/ now admitted with Hyperkalemia 7.2 ( she reports RENAL  diet indiscretion  =Sweet potato pie/ holiday foods)/ Emergent HTN 216/147= yesterday  In ER ,and abdominal pain  with Abdominal x-ray was unremarkable for obstruction. Her LFTs and lipase were normal  And she was afebrile .She  denies fevers, chills, sweats, chest pain , sob . She has been compliant with HD txs with last tx on schedule 05/17/16. Noted at last admit  Her BP was  controlled  With meds  before DC and at outpt  Kidney center BP has been running high with unremarkable fluid gains.   Currently seen on HD and denies abdominal  pain .    Past Medical History:  Diagnosis Date  . Chronic kidney disease    previous hx dialysis  . Dialysis patient ([facility])   . History of kidney transplant 2012   kidney failure due to hypertension  . Hypertension   . Peptic ulcer     Past Surgical History:  Procedure Laterality Date  . ESOPHAGOGASTRODUODENOSCOPY N/A 04/01/2016   Procedure: ESOPHAGOGASTRODUODENOSCOPY (EGD);  Surgeon: Gatha Mayer, MD;  Location[facility] ENDOSCOPY;  Service: Endoscopy;  Laterality: N/A;  . KIDNEY TRANSPLANT Bilateral 2012      Family History  Problem Relation Age of Onset  . Kidney disease Father   . Lupus Maternal Grandmother   . Cancer Maternal Grandfather       reports that she has never smoked. She has never used smokeless tobacco. She reports that she does not drink alcohol or use drugs.  No Known  Allergies  Prior to Admission medications   Medication Sig Start Date End Date Taking? Authorizing Provider  amLODipine (NORVASC) 10 MG tablet Take 1 tablet (10 mg total) by mouth every evening. Patient taking differently: Take 10 mg by mouth daily.  09/13/15  Yes [name], MD  calcium acetate (PHOSLO) 667 MG capsule Take 2,001 mg by mouth 3 (three) times daily with meals. 05/29/15  Yes Historical Provider, MD  cloNIDine (CATAPRES) 0.1 MG tablet Take 0.1 mg by mouth 2 (two) times daily.   Yes Historical Provider, MD  [employer] ANTI-ITCH lotion Apply 1 application topically daily as needed for itching. 05/08/16  Yes Historical Provider, MD  labetalol (NORMODYNE) 200 MG tablet Take 1 tablet (200 mg total) by mouth 2 (two) times daily. 04/02/16  Yes Albertine Patricia, MD  losartan (COZAAR) 50 MG tablet Take 1 tablet (50 mg total) by mouth every evening. 09/13/15  Yes [name], MD  ondansetron (ZOFRAN ODT) 8 MG disintegrating tablet Take 1 tablet (8 mg total) by mouth every 8 (eight) hours as needed for nausea or vomiting. 03/24/16  Yes Jola Schmidt, MD  pantoprazole (PROTONIX) 40 MG tablet Take 1 tablet (40 mg total) by mouth 2 (two) times daily. 04/02/16  Yes Albertine Patricia, MD  multivitamin (RENA-VIT) TABS tablet Take 1 tablet by mouth at bedtime. Patient not taking: Reported on 05/19/2016 06/09/15   Debbe Odea, MD    GNF:AOZHYQ chloride, sodium chloride, acetaminophen **OR** acetaminophen, heparin, hydrALAZINE, HYDROmorphone (DILAUDID) injection, labetalol, lidocaine (PF), lidocaine-prilocaine, ondansetron **  OR** ondansetron (ZOFRAN) IV, pentafluoroprop-tetrafluoroeth  Results for orders placed or performed during the hospital encounter of 05/19/16 (from the past 48 hour(s))  Comprehensive metabolic panel     Status: Abnormal   Collection Time: 05/19/16  9:45 AM  Result Value Ref Range   Sodium 135 135 - 145 mmol/L   Potassium 5.8 (H) 3.5 - 5.1 mmol/L   Chloride 94 (L) 101 -  111 mmol/L   CO2 25 22 - 32 mmol/L   Glucose, Bld 77 65 - 99 mg/dL   BUN 47 (H) 6 - 20 mg/dL   Creatinine, Ser 12.55 (H) 0.44 - 1.00 mg/dL   Calcium 9.7 8.9 - 10.3 mg/dL   Total Protein 7.5 6.5 - 8.1 g/dL   Albumin 4.1 3.5 - 5.0 g/dL   AST 29 15 - 41 U/L   ALT 13 (L) 14 - 54 U/L   Alkaline Phosphatase 69 38 - 126 U/L   Total Bilirubin 1.0 0.3 - 1.2 mg/dL   GFR calc non Af Amer 4 (L) >60 mL/min   GFR calc Af Amer 4 (L) >60 mL/min    Comment: (NOTE) The eGFR has been calculated using the CKD EPI equation. This calculation has not been validated in all clinical situations. eGFR's persistently <60 mL/min signify possible Chronic Kidney Disease.    Anion gap 16 (H) 5 - 15  Lipase, blood     Status: None   Collection Time: 05/19/16  9:45 AM  Result Value Ref Range   Lipase 20 11 - 51 U/L  CBC WITH DIFFERENTIAL     Status: None   Collection Time: 05/19/16  9:45 AM  Result Value Ref Range   WBC SPECIMEN CLOTTED 4.0 - 10.5 K/uL    Comment: A RHONEY AT 1031 ON 11.26.2017 BY NBROOKS CORRECTED ON 11/26 AT 1027: PREVIOUSLY REPORTED AS 10.9    RBC SPECIMEN CLOTTED 3.87 - 5.11 MIL/uL    Comment: CORRECTED ON 11/26 AT 1027: PREVIOUSLY REPORTED AS 4.05   Hemoglobin SPECIMEN CLOTTED 12.0 - 15.0 g/dL    Comment: CORRECTED ON 11/26 AT 1027: PREVIOUSLY REPORTED AS 12.4   HCT SPECIMEN CLOTTED 36.0 - 46.0 %    Comment: CORRECTED ON 11/26 AT 1027: PREVIOUSLY REPORTED AS 37.5   MCV SPECIMEN CLOTTED 78.0 - 100.0 fL    Comment: CORRECTED ON 11/26 AT 1027: PREVIOUSLY REPORTED AS 92.6   MCH SPECIMEN CLOTTED 26.0 - 34.0 pg    Comment: CORRECTED ON 11/26 AT 1027: PREVIOUSLY REPORTED AS 30.6   MCHC SPECIMEN CLOTTED 30.0 - 36.0 g/dL    Comment: CORRECTED ON 11/26 AT 1027: PREVIOUSLY REPORTED AS 33.1   RDW SPECIMEN CLOTTED 11.5 - 15.5 %    Comment: CORRECTED ON 11/26 AT 1027: PREVIOUSLY REPORTED AS 16.0   Platelets SPECIMEN CLOTTED 150 - 400 K/uL   Other SPECIMEN CLOTTED %   Neutrophils Relative %  SPECIMEN CLOTTED %    Comment: CORRECTED ON 11/26 AT 1027: PREVIOUSLY REPORTED AS 76   Neutro Abs SPECIMEN CLOTTED 1.7 - 7.7 K/uL    Comment: CORRECTED ON 11/26 AT 1027: PREVIOUSLY REPORTED AS 8.3   Band Neutrophils SPECIMEN CLOTTED %   Lymphocytes Relative SPECIMEN CLOTTED %    Comment: CORRECTED ON 11/26 AT 1027: PREVIOUSLY REPORTED AS 13   Lymphs Abs SPECIMEN CLOTTED 0.7 - 4.0 K/uL    Comment: CORRECTED ON 11/26 AT 1027: PREVIOUSLY REPORTED AS 1.4   Monocytes Relative SPECIMEN CLOTTED %    Comment: CORRECTED ON 11/26 AT 1027: PREVIOUSLY REPORTED  AS 9   Monocytes Absolute SPECIMEN CLOTTED 0.1 - 1.0 K/uL    Comment: CORRECTED ON 11/26 AT 1027: PREVIOUSLY REPORTED AS 1.0   Eosinophils Relative SPECIMEN CLOTTED %    Comment: CORRECTED ON 11/26 AT 1027: PREVIOUSLY REPORTED AS 3   Eosinophils Absolute SPECIMEN CLOTTED 0.0 - 0.7 K/uL    Comment: CORRECTED ON 11/26 AT 1027: PREVIOUSLY REPORTED AS 0.3   Basophils Relative SPECIMEN CLOTTED %    Comment: CORRECTED ON 11/26 AT 1027: PREVIOUSLY REPORTED AS 0   Basophils Absolute SPECIMEN CLOTTED 0.0 - 0.1 K/uL    Comment: CORRECTED ON 11/26 AT 1027: PREVIOUSLY REPORTED AS 0.0   WBC Morphology SPECIMEN CLOTTED    RBC Morphology SPECIMEN CLOTTED    Smear Review SPECIMEN CLOTTED    nRBC SPECIMEN CLOTTED 0 /100 WBC   Metamyelocytes Relative SPECIMEN CLOTTED %   Myelocytes SPECIMEN CLOTTED %   Promyelocytes Absolute SPECIMEN CLOTTED %   Blasts SPECIMEN CLOTTED %  I-Stat beta hCG blood, ED     Status: None   Collection Time: 05/19/16  9:53 AM  Result Value Ref Range   I-stat hCG, quantitative <5.0 <5 mIU/mL   Comment 3            Comment:   GEST. AGE      CONC.  (mIU/mL)   <=1 WEEK        5 - 50     2 WEEKS       50 - 500     3 WEEKS       100 - 10,000     4 WEEKS     1,000 - 30,000        FEMALE AND NON-PREGNANT FEMALE:     LESS THAN 5 mIU/mL   CBC with Differential/Platelet     Status: Abnormal   Collection Time: 05/19/16 10:55 AM   Result Value Ref Range   WBC 10.6 (H) 4.0 - 10.5 K/uL   RBC 3.97 3.87 - 5.11 MIL/uL   Hemoglobin 12.4 12.0 - 15.0 g/dL   HCT 37.3 36.0 - 46.0 %   MCV 94.0 78.0 - 100.0 fL   MCH 31.2 26.0 - 34.0 pg   MCHC 33.2 30.0 - 36.0 g/dL   RDW 16.2 (H) 11.5 - 15.5 %   Platelets 185 150 - 400 K/uL   Neutrophils Relative % 82 %   Neutro Abs 8.7 (H) 1.7 - 7.7 K/uL   Lymphocytes Relative 9 %   Lymphs Abs 1.0 0.7 - 4.0 K/uL   Monocytes Relative 7 %   Monocytes Absolute 0.7 0.1 - 1.0 K/uL   Eosinophils Relative 2 %   Eosinophils Absolute 0.2 0.0 - 0.7 K/uL   Basophils Relative 0 %   Basophils Absolute 0.0 0.0 - 0.1 K/uL  MRSA PCR Screening     Status: None   Collection Time: 05/19/16  5:09 PM  Result Value Ref Range   MRSA by PCR NEGATIVE NEGATIVE    Comment:        The GeneXpert MRSA Assay (FDA approved for NASAL specimens only), is one component of a comprehensive MRSA colonization surveillance program. It is not intended to diagnose MRSA infection nor to guide or monitor treatment for MRSA infections.   Renal function panel     Status: Abnormal   Collection Time: 05/19/16  5:54 PM  Result Value Ref Range   Sodium 131 (L) 135 - 145 mmol/L   Potassium 6.6 (HH) 3.5 - 5.1 mmol/L  Comment: CRITICAL RESULT CALLED TO, READ BACK BY AND VERIFIED WITH: GARDNER D,RN 05/19/16 2122 WAYK    Chloride 93 (L) 101 - 111 mmol/L   CO2 20 (L) 22 - 32 mmol/L   Glucose, Bld 67 65 - 99 mg/dL   BUN 50 (H) 6 - 20 mg/dL   Creatinine, Ser 13.62 (H) 0.44 - 1.00 mg/dL   Calcium 9.2 8.9 - 10.3 mg/dL   Phosphorus 7.3 (H) 2.5 - 4.6 mg/dL   Albumin 3.8 3.5 - 5.0 g/dL   GFR calc non Af Amer 3 (L) >60 mL/min   GFR calc Af Amer 4 (L) >60 mL/min    Comment: (NOTE) The eGFR has been calculated using the CKD EPI equation. This calculation has not been validated in all clinical situations. eGFR's persistently <60 mL/min signify possible Chronic Kidney Disease.    Anion gap 18 (H) 5 - 15  CBC     Status:  Abnormal   Collection Time: 05/19/16  5:54 PM  Result Value Ref Range   WBC 11.4 (H) 4.0 - 10.5 K/uL   RBC 4.01 3.87 - 5.11 MIL/uL   Hemoglobin 12.5 12.0 - 15.0 g/dL   HCT 37.5 36.0 - 46.0 %   MCV 93.5 78.0 - 100.0 fL   MCH 31.2 26.0 - 34.0 pg   MCHC 33.3 30.0 - 36.0 g/dL   RDW 16.3 (H) 11.5 - 15.5 %   Platelets 183 150 - 400 K/uL  CBC     Status: Abnormal   Collection Time: 05/20/16  6:28 AM  Result Value Ref Range   WBC 10.9 (H) 4.0 - 10.5 K/uL   RBC 3.91 3.87 - 5.11 MIL/uL   Hemoglobin 11.9 (L) 12.0 - 15.0 g/dL   HCT 36.8 36.0 - 46.0 %   MCV 94.1 78.0 - 100.0 fL   MCH 30.4 26.0 - 34.0 pg   MCHC 32.3 30.0 - 36.0 g/dL   RDW 16.8 (H) 11.5 - 15.5 %   Platelets 184 150 - 400 K/uL  Basic metabolic panel     Status: Abnormal   Collection Time: 05/20/16  6:28 AM  Result Value Ref Range   Sodium 137 135 - 145 mmol/L   Potassium 7.2 (HH) 3.5 - 5.1 mmol/L    Comment: CRITICAL RESULT CALLED TO, READ BACK BY AND VERIFIED WITH: D.JAVIER,RN 1610 05/20/16 CLARK,S NO VISIBLE HEMOLYSIS    Chloride 94 (L) 101 - 111 mmol/L   CO2 22 22 - 32 mmol/L   Glucose, Bld 81 65 - 99 mg/dL   BUN 65 (H) 6 - 20 mg/dL   Creatinine, Ser 15.52 (H) 0.44 - 1.00 mg/dL   Calcium 9.7 8.9 - 10.3 mg/dL   GFR calc non Af Amer 3 (L) >60 mL/min   GFR calc Af Amer 3 (L) >60 mL/min    Comment: (NOTE) The eGFR has been calculated using the CKD EPI equation. This calculation has not been validated in all clinical situations. eGFR's persistently <60 mL/min signify possible Chronic Kidney Disease.    Anion gap 21 (H) 5 - 15  Albumin     Status: None   Collection Time: 05/20/16  7:57 AM  Result Value Ref Range   Albumin 3.7 3.5 - 5.0 g/dL  Phosphorus     Status: Abnormal   Collection Time: 05/20/16  7:57 AM  Result Value Ref Range   Phosphorus 7.7 (H) 2.5 - 4.6 mg/dL     ROS: see hpi for positives  Physical Exam: Vitals:   05/20/16  0830 05/20/16 0900  BP: (!) 168/116 (!) 184/115  Pulse: 97 96  Resp:     Temp:       General: Alert thin AAF NAD , Alert ,  OX4 HEENT: [place] , MM dry , eomi  Neck: no jvd / supple  Heart: Tachy Regular( 115 Rate ), no rub  Or gallop  Or mur.  Lungs: CTA  Abdomen: BS pos , Soft NT, ND Extremities: No pedal edema  Skin: warm dry ,no overt rash Neuro: alert OX4 , moves all extrem ., no overt focal deficits  Dialysis Access: paten ton HD LU A AVF   Dialysis Orders: Center: [facility]    on MWF  . EDW 57.5 kg  HD Bath 2k, 2.25 ca  Time 3hrs 39mn. Heparin NONE. Access LUA AVF      Hectorol 5 mcg IV/HD   Mircera 50 mcg q 2 weeks (last given 05/12/16)      Other = OP labs=  hgb 11.2   (05/14/16)  Ca 10, Phos 8.0   PTH= 271<346  Assessment/Plan 1. Hyperkalemia= K 7.2 Hd now  Fu labs (probable  renal diet indiscretions ) 2. Emergent HTN=  BP meds / HD  Today  3. Epigastric pain relieved history of gastric ulcers-  Pain on hd resolved now / RX per admit ( Noted Recent hospitalization October =nonbleeding superficial gastric ulcer in pyloric and prepyloric area with heaped up mucosa causing partial obstruction, pathology was benign ).  4. ESRD -  Normal schedule MWF  5. Hypertension/volume  -  bp slowly improving ( by wt only 0.9 over edw today ) RX with HD / meds,   Volume  Usually  not a problem with her  6. Anemia  - HGB = 11.9  Last ESA= (Mircera 521m on 05/12/16 ) no tdue this week fu  hgb trend  7. Metabolic bone disease -  Iv Hectorol /binder with meals (noted op nonadherent  To diet binders ) Phoslo 3 ac . 8. Nutrition - Alb 3.7    DaErnest HaberPA-C [facility]per [phone]/27/2017, 9:36 AM   Pt seen, examined and agree w A/P as above. ESRD pt with recurrent N/V, recent EGD with gastric ulcers in October.  LFT's normal, abd benign.  HTN urgency.  On HD now and stable.  Will follow.  RoKelly SplinterD CaNewell Rubbermaidager [phone] 05/20/2016, 11:33 AM

## 2016-05-21 DIAGNOSIS — I10 Essential (primary) hypertension: Secondary | ICD-10-CM

## 2016-05-21 DIAGNOSIS — I160 Hypertensive urgency: Principal | ICD-10-CM

## 2016-05-21 LAB — CBC
HCT: 34.3 % — ABNORMAL LOW (ref 36.0–46.0)
HEMOGLOBIN: 11.1 g/dL — AB (ref 12.0–15.0)
MCH: 30.7 pg (ref 26.0–34.0)
MCHC: 32.4 g/dL (ref 30.0–36.0)
MCV: 94.8 fL (ref 78.0–100.0)
Platelets: 179 10*3/uL (ref 150–400)
RBC: 3.62 MIL/uL — AB (ref 3.87–5.11)
RDW: 16.6 % — ABNORMAL HIGH (ref 11.5–15.5)
WBC: 9.7 10*3/uL (ref 4.0–10.5)

## 2016-05-21 LAB — BASIC METABOLIC PANEL
ANION GAP: 15 (ref 5–15)
BUN: 34 mg/dL — ABNORMAL HIGH (ref 6–20)
CHLORIDE: 94 mmol/L — AB (ref 101–111)
CO2: 28 mmol/L (ref 22–32)
CREATININE: 10.87 mg/dL — AB (ref 0.44–1.00)
Calcium: 9.7 mg/dL (ref 8.9–10.3)
GFR calc non Af Amer: 4 mL/min — ABNORMAL LOW (ref >60)
GFR, EST AFRICAN AMERICAN: 5 mL/min — AB (ref >60)
Glucose, Bld: 90 mg/dL (ref 65–99)
Potassium: 4.6 mmol/L (ref 3.5–5.1)
SODIUM: 137 mmol/L (ref 135–145)

## 2016-05-21 MED ORDER — AMLODIPINE BESYLATE 10 MG PO TABS: 10.0000 mg | ORAL_TABLET | Freq: Every day | ORAL | Status: DC

## 2016-05-21 MED ORDER — AMLODIPINE BESYLATE 10 MG PO TABS
10.0000 mg | ORAL_TABLET | Freq: Once | ORAL | Status: AC
  Administered 2016-05-21: 10 mg via ORAL
  Filled 2016-05-21: qty 1

## 2016-05-21 MED ORDER — LOSARTAN POTASSIUM 100 MG PO TABS: 100.0000 mg | ORAL_TABLET | Freq: Every evening | ORAL | 0 refills | Status: DC

## 2016-05-21 NOTE — Progress Notes (Signed)
[facility] Progress Note   Dialysis Orders: [facility]    on MWF  . EDW 57.5 kg  HD Bath 2k, 2.25 ca  Time 3hrs 52min. Heparin NONE. Access LUA AVF      Hectorol 5 mcg IV/HD   Mircera 50 mcg q 2 weeks (last given 05/12/16)      Other = OP labs=  hgb 11.2   (05/14/16)  Ca 10, Phos 8.0   PTH= 271<346  Assessment/Plan: 1. Hyperkalemia - attributes to multiple pieces of sweet potato pie; 4.6 today 2. ESRD -MWF - next HD Wed - no heparin HD; schedule for first round, if not d/c today 3. Anemia - hgb 11.1- next ESA due 12/4 4. Secondary hyperparathyroidism - binders and hectorol 5. Hypertensive crisis/volume - net UF 2.9 L post weight 53.4 - if this is accurate it is significantly below EDW; BP still on the high side - and is even higher at outpt unit. Titrate EDW down as able to help with BP control. She is only on 2 of her 4 BP meds; I called her pharmacy today [facility].  She last filled norvasc 10 11/12, clonidine - NEVER filled, labetolol 200 bid 10/10 and losartan 50 last filled in April - so at the most she has only been taking norvasc and labetolol.  Will add back norvasc 10 this am and then change to HS starting Thursday. 6. Nutrition - renal diet/vits 7. Epigastric pain - hx gastric ulcers - resolved  Myriam Jacobson, PA-C [facility] Beeper [phone] 05/21/2016,8:34 AM  LOS: 2 days    Pt seen, examined and agree w A/P as above. For dc today, BP's better. As above Kelly Splinter MD [facility] pager [phone]   05/21/2016, 2:16 PM     Subjective:   Hungry this am. Ate all of breakfast. No more nausea. No problems with Hd yesterday   Objective Vitals:   05/20/16 2125 05/20/16 2221 05/21/16 0553 05/21/16 0809  BP: (!) 183/108  129/87 (!) 156/95  Pulse: (!) 103  83 78  Resp:   17 18  Temp: 99.2 F (37.3 C)  98.8 F (37.1 C) 99 F (37.2 C)  TempSrc: Oral  Oral Oral  SpO2: 100%  98% 99%  Weight:  54.6 kg (120 lb 4.8 oz)     Height:       Physical Exam General: NAD Heart: RRR Lungs: no rales Abdomen: soft NT + BS Extremities: no LE edema Dialysis Access: left AVF + bruit   Additional Objective Labs: Basic Metabolic Panel:  Recent Labs Lab 05/19/16 1754 05/20/16 0628 05/20/16 0757 05/21/16 0424  NA 131* 137  --  137  K 6.6* 7.2*  --  4.6  CL 93* 94*  --  94*  CO2 20* 22  --  28  GLUCOSE 67 81  --  90  BUN 50* 65*  --  34*  CREATININE 13.62* 15.52*  --  10.87*  CALCIUM 9.2 9.7  --  9.7  PHOS 7.3*  --  7.7*  --    Liver Function Tests:  Recent Labs Lab 05/19/16 0945 05/19/16 1754 05/20/16 0757  AST 29  --   --   ALT 13*  --   --   ALKPHOS 69  --   --   BILITOT 1.0  --   --   PROT 7.5  --   --   ALBUMIN 4.1 3.8 3.7    Recent Labs Lab 05/19/16 0945  LIPASE 20   CBC:  Recent Labs Lab 05/19/16 0945 05/19/16 1055 05/19/16 1754 05/20/16 0628 05/21/16 0424  WBC SPECIMEN CLOTTED 10.6* 11.4* 10.9* 9.7  NEUTROABS SPECIMEN CLOTTED 8.7*  --   --   --   HGB SPECIMEN CLOTTED 12.4 12.5 11.9* 11.1*  HCT SPECIMEN CLOTTED 37.3 37.5 36.8 34.3*  MCV SPECIMEN CLOTTED 94.0 93.5 94.1 94.8  PLT SPECIMEN CLOTTED 185 183 184 179   Blood Culture    Component Value Date/Time   SDES URINE, CLEAN CATCH 06/26/2014 2035   SPECREQUEST None 06/26/2014 2035   CULT  06/26/2014 2035    LACTOBACILLUS SPECIES Note: Standardized susceptibility testing for this organism is not available. Performed at [facility] 06/28/2014 FINAL 06/26/2014 2035    Cardiac Enzymes: No results for input(s): CKTOTAL, CKMB, CKMBINDEX, TROPONINI in the last 168 hours. CBG: No results for input(s): GLUCAP in the last 168 hours. Iron Studies: No results for input(s): IRON, TIBC, TRANSFERRIN, FERRITIN in the last 72 hours. Lab Results  Component Value Date   INR 1.0 09/03/2007   INR 1.0 09/02/2007   INR 1.0 08/26/2007   Studies/Results: Dg Abdomen Acute W/chest  Result Date:  05/19/2016 CLINICAL DATA:  Epigastric pain EXAM: DG ABDOMEN ACUTE W/ 1V CHEST COMPARISON:  03/31/2016 FINDINGS: Borderline cardiomegaly. No acute infiltrate or pleural effusion. No pulmonary edema. There is normal small bowel gas pattern. Moderate stool noted in right colon. No evidence of free abdominal air. Again noted calcified transplanted kidney in right lower quadrant. IMPRESSION: Negative abdominal radiographs.  No acute cardiopulmonary disease. Electronically Signed   By: Lahoma Crocker M.D.   On: 05/19/2016 11:04   Medications:  . calcium acetate  2,001 mg Oral TID WC  . cloNIDine  0.1 mg Oral BID  . doxercalciferol  5 mcg Intravenous Q M,W,F-HD  . heparin  5,000 Units Subcutaneous Q8H  . labetalol  200 mg Oral BID  . multivitamin  1 tablet Oral QHS  . pantoprazole (PROTONIX) IV  40 mg Intravenous Q12H

## 2016-05-21 NOTE — Discharge Summary (Signed)
Physician Discharge Summary  Kerry Cook GHW:[id number] DOB: July 18, 1985 DOA: 05/19/2016  PCP: [name], FNP  Admit date: 05/19/2016 Discharge date: 05/21/2016  Admitted From: Home Disposition: Home  Recommendations for Outpatient Follow-up:  1. Follow up with PCP in 1-2 weeks 2. Please obtain BMP/CBC in one week  Home Health: NA Equipment/Devices:NA  Discharge Condition: Stable CODE STATUS: Full Code Diet recommendation: Diet renal with fluid restriction Fluid restriction: 1200 mL Fluid; Room service appropriate? Yes; Fluid consistency: Thin Diet - low sodium heart healthy  Brief/Interim Summary: [name] a 30 y.o.female,With history of ESRD on dialysis (M, W, F), failed renal transplant, uncontrolled hypertension who was hospitalized 6 weeks back with partial gastric outlet obstruction secondary to superficial nonbleeding gastric ulcer in the pyloric and prepyloric area with heaped up mucus causing partial obstruction. She originally presented to the [place] ED on 11/26 for acute epigastric pain which she states feels similar to her presentation 6 weeks ago. She believes her pain was triggered by eating Janine Limbo. Due to severe pain, the patient was unable to take her BP medications. She was found to be in hypertensive crisis in the ED with a BP of 216/147. She was given IV hydralazine, labetalol, and clonidine. WBC of 10.6, normal hemoglobin and platelets. Chemistry showed sodium of 135, k+ 5.8, BUN of 47 and creatinine of 12.5. LFTs and lipase were normal. Abdominal x-ray was unremarkable for obstruction.  Discharge Diagnoses:  Active Problems:   End stage renal disease on dialysis [facility])   Hypertensive urgency   ESRD (end stage renal disease) on dialysis [facility])   Multiple gastric ulcers   Malignant hypertension   Acute gastritis without hemorrhage   Hyperkalemia   Hypertensive crisis   Hypertensive crisis -BP was 216/147 in the ED, currently at  186/118 with ED medications -Patient was unable to take her BP medications last night due to epigastric pain -Per nephrology patient was not able take all of her medications. -I have increased her losartan to 100 mg and resent amlodipine to her pharmacy. -This is does not appear to be just incident happenned with nausea and vomiting this time. -Her EKG showed finding consistent with LVH, likely chronic uncontrolled hypertension.  Epigastric pain relieved history of gastric ulcers. -Hospitalization 6 weeks ago for possible gastric outlet obstruction.  -Presented with epigastric pain and one episode of vomiting but this has improved -Serial abdominal XR is negative -Was nothing by mouth, diet advanced, currently she is on clear liquids, her PPI continued. -Tolerated diet without nausea or vomiting, discharge home today.  End stage renal disease on dialysis ([place]) -On M,W,F.Compliant with regular dialysis. Last dialysis on 11/24.  -No signs of volume overload.  Hyperkalemia -Potassium 5.8 yesterday and 7.2 today prior to HD -ST segment tenting on the EKG. Check BMP in AM -Continue to monitor renal function.  Anemia  - HGB = 11.9  - Last ESA= (Mircera 50 mcg on 78/93/81 )   Metabolic bone disease  -Iv Hectorol /binder with meals (noted op nonadherent To diet binders ) Phoslo 3 ac .   Discharge Instructions  Discharge Instructions    Diet - low sodium heart healthy    Complete by:  As directed    Increase activity slowly    Complete by:  As directed        Medication List    TAKE these medications   amLODipine 10 MG tablet Commonly known as:  NORVASC Take 1 tablet (10 mg total) by mouth every evening. What  changed:  when to take this   calcium acetate 667 MG capsule Commonly known as:  PHOSLO Take 2,001 mg by mouth 3 (three) times daily with meals.   cloNIDine 0.1 MG tablet Commonly known as:  CATAPRES Take 0.1 mg by mouth 2 (two) times daily.   [facility]  ANTI-ITCH lotion Generic drug:  camphor-menthol Apply 1 application topically daily as needed for itching.   labetalol 200 MG tablet Commonly known as:  NORMODYNE Take 1 tablet (200 mg total) by mouth 2 (two) times daily.   losartan 100 MG tablet Commonly known as:  COZAAR Take 1 tablet (100 mg total) by mouth every evening. What changed:  medication strength  how much to take   multivitamin Tabs tablet Take 1 tablet by mouth at bedtime.   ondansetron 8 MG disintegrating tablet Commonly known as:  ZOFRAN ODT Take 1 tablet (8 mg total) by mouth every 8 (eight) hours as needed for nausea or vomiting.   pantoprazole 40 MG tablet Commonly known as:  PROTONIX Take 1 tablet (40 mg total) by mouth 2 (two) times daily.       No Known Allergies  Consultations:  Treatment Team:   Corliss Parish, MD, nephrology   Procedures (Echo, Carotid, EGD, Colonoscopy, ERCP)   Radiological studies: Dg Abdomen Acute W/chest  Result Date: 05/19/2016 CLINICAL DATA:  Epigastric pain EXAM: DG ABDOMEN ACUTE W/ 1V CHEST COMPARISON:  03/31/2016 FINDINGS: Borderline cardiomegaly. No acute infiltrate or pleural effusion. No pulmonary edema. There is normal small bowel gas pattern. Moderate stool noted in right colon. No evidence of free abdominal air. Again noted calcified transplanted kidney in right lower quadrant. IMPRESSION: Negative abdominal radiographs.  No acute cardiopulmonary disease. Electronically Signed   By: Lahoma Crocker M.D.   On: 05/19/2016 11:04     Subjective:  Discharge Exam: Vitals:   05/20/16 2221 05/21/16 0553 05/21/16 0809 05/21/16 0956  BP:  129/87 (!) 156/95 (!) 167/94  Pulse:  83 78 85  Resp:  17 18 17   Temp:  98.8 F (37.1 C) 99 F (37.2 C) 99.2 F (37.3 C)  TempSrc:  Oral Oral Oral  SpO2:  98% 99% 100%  Weight: 54.6 kg (120 lb 4.8 oz)     Height:       General: Pt is alert, awake, not in acute distress Cardiovascular: RRR, S1/S2 +, no rubs, no  gallops Respiratory: CTA bilaterally, no wheezing, no rhonchi Abdominal: Soft, NT, ND, bowel sounds + Extremities: no edema, no cyanosis   The results of significant diagnostics from this hospitalization (including imaging, microbiology, ancillary and laboratory) are listed below for reference.    Microbiology: Recent Results (from the past 240 hour(s))  MRSA PCR Screening     Status: None   Collection Time: 05/19/16  5:09 PM  Result Value Ref Range Status   MRSA by PCR NEGATIVE NEGATIVE Final    Comment:        The GeneXpert MRSA Assay (FDA approved for NASAL specimens only), is one component of a comprehensive MRSA colonization surveillance program. It is not intended to diagnose MRSA infection nor to guide or monitor treatment for MRSA infections.      Labs: BNP (last 3 results) No results for input(s): BNP in the last 8760 hours. Basic Metabolic Panel:  Recent Labs Lab 05/19/16 0945 05/19/16 1754 05/20/16 0628 05/20/16 0757 05/21/16 0424  NA 135 131* 137  --  137  K 5.8* 6.6* 7.2*  --  4.6  CL 94* 93* 94*  --  94*  CO2 25 20* 22  --  28  GLUCOSE 77 67 81  --  90  BUN 47* 50* 65*  --  34*  CREATININE 12.55* 13.62* 15.52*  --  10.87*  CALCIUM 9.7 9.2 9.7  --  9.7  PHOS  --  7.3*  --  7.7*  --    Liver Function Tests:  Recent Labs Lab 05/19/16 0945 05/19/16 1754 05/20/16 0757  AST 29  --   --   ALT 13*  --   --   ALKPHOS 69  --   --   BILITOT 1.0  --   --   PROT 7.5  --   --   ALBUMIN 4.1 3.8 3.7    Recent Labs Lab 05/19/16 0945  LIPASE 20   No results for input(s): AMMONIA in the last 168 hours. CBC:  Recent Labs Lab 05/19/16 0945 05/19/16 1055 05/19/16 1754 05/20/16 0628 05/21/16 0424  WBC SPECIMEN CLOTTED 10.6* 11.4* 10.9* 9.7  NEUTROABS SPECIMEN CLOTTED 8.7*  --   --   --   HGB SPECIMEN CLOTTED 12.4 12.5 11.9* 11.1*  HCT SPECIMEN CLOTTED 37.3 37.5 36.8 34.3*  MCV SPECIMEN CLOTTED 94.0 93.5 94.1 94.8  PLT SPECIMEN CLOTTED 185 183  184 179   Cardiac Enzymes: No results for input(s): CKTOTAL, CKMB, CKMBINDEX, TROPONINI in the last 168 hours. BNP: Invalid input(s): POCBNP CBG: No results for input(s): GLUCAP in the last 168 hours. D-Dimer No results for input(s): DDIMER in the last 72 hours. Hgb A1c No results for input(s): HGBA1C in the last 72 hours. Lipid Profile No results for input(s): CHOL, HDL, LDLCALC, TRIG, CHOLHDL, LDLDIRECT in the last 72 hours. Thyroid function studies No results for input(s): TSH, T4TOTAL, T3FREE, THYROIDAB in the last 72 hours.  Invalid input(s): FREET3 Anemia work up No results for input(s): VITAMINB12, FOLATE, FERRITIN, TIBC, IRON, RETICCTPCT in the last 72 hours. Urinalysis    Component Value Date/Time   COLORURINE YELLOW 01/21/2015 1343   APPEARANCEUR HAZY (A) 01/21/2015 1343   LABSPEC 1.009 01/21/2015 1343   PHURINE 8.5 (H) 01/21/2015 1343   GLUCOSEU 100 (A) 01/21/2015 1343   HGBUR SMALL (A) 01/21/2015 1343   BILIRUBINUR NEGATIVE 01/21/2015 1343   KETONESUR 15 (A) 01/21/2015 1343   PROTEINUR >300 (A) 01/21/2015 1343   UROBILINOGEN 0.2 01/21/2015 1343   NITRITE NEGATIVE 01/21/2015 1343   LEUKOCYTESUR NEGATIVE 01/21/2015 1343   Sepsis Labs Invalid input(s): PROCALCITONIN,  WBC,  LACTICIDVEN Microbiology Recent Results (from the past 240 hour(s))  MRSA PCR Screening     Status: None   Collection Time: 05/19/16  5:09 PM  Result Value Ref Range Status   MRSA by PCR NEGATIVE NEGATIVE Final    Comment:        The GeneXpert MRSA Assay (FDA approved for NASAL specimens only), is one component of a comprehensive MRSA colonization surveillance program. It is not intended to diagnose MRSA infection nor to guide or monitor treatment for MRSA infections.      Time coordinating discharge: Over 30 minutes  SIGNED:   Birdie Hopes, MD  [facility] 05/21/2016, 11:30 AM Pager   If 7PM-7AM, please contact night-coverage [email or web address] Password [email or web address]

## 2016-05-21 NOTE — Final Progress Note (Signed)
Patient discharge teaching given, including activity, diet, follow-up appoints, and medications. Patient verbalized understanding of all discharge instructions. IV access was d/c'd. Vitals are stable. Skin is intact except as charted in most recent assessments. Pt to be escorted out by volunteer, to be driven home by family. 

## 2016-05-21 NOTE — Care Management Note (Signed)
Case Management Note  Patient Details  Name: Kerry Cook MRN: [id number] Date of Birth: 1986-05-10  Subjective/Objective:      CM following for progression and d/c planning.              Action/Plan: 05/21/2016 Noted plan to d/c this pt to home today. No DME or HH needs identified. Pt is ambulatory and has insurance .   Expected Discharge Date:  05/21/16               Expected Discharge Plan:  Home/Self Care  In-House Referral:  NA  Discharge planning Services  NA  Post Acute Care Choice:  NA Choice offered to:  NA  DME Arranged:    DME Agency:     HH Arranged:    HH Agency:     Status of Service:  Completed, signed off  If discussed at [employer] of Stay Meetings, dates discussed:    Additional Comments:  Adron Bene, RN 05/21/2016, 12:03 PM

## 2016-05-22 DIAGNOSIS — N186 End stage renal disease: Secondary | ICD-10-CM | POA: Diagnosis not present

## 2016-05-22 DIAGNOSIS — D631 Anemia in chronic kidney disease: Secondary | ICD-10-CM | POA: Diagnosis not present

## 2016-05-22 DIAGNOSIS — N2581 Secondary hyperparathyroidism of renal origin: Secondary | ICD-10-CM | POA: Diagnosis not present

## 2016-05-23 DIAGNOSIS — N186 End stage renal disease: Secondary | ICD-10-CM | POA: Diagnosis not present

## 2016-05-23 DIAGNOSIS — T8610 Unspecified complication of kidney transplant: Secondary | ICD-10-CM | POA: Diagnosis not present

## 2016-05-23 DIAGNOSIS — Z992 Dependence on renal dialysis: Secondary | ICD-10-CM | POA: Diagnosis not present

## 2016-05-24 DIAGNOSIS — N186 End stage renal disease: Secondary | ICD-10-CM | POA: Diagnosis not present

## 2016-05-24 DIAGNOSIS — D631 Anemia in chronic kidney disease: Secondary | ICD-10-CM | POA: Diagnosis not present

## 2016-05-24 DIAGNOSIS — N2581 Secondary hyperparathyroidism of renal origin: Secondary | ICD-10-CM | POA: Diagnosis not present

## 2016-05-27 DIAGNOSIS — D631 Anemia in chronic kidney disease: Secondary | ICD-10-CM | POA: Diagnosis not present

## 2016-05-27 DIAGNOSIS — N2581 Secondary hyperparathyroidism of renal origin: Secondary | ICD-10-CM | POA: Diagnosis not present

## 2016-05-27 DIAGNOSIS — N186 End stage renal disease: Secondary | ICD-10-CM | POA: Diagnosis not present

## 2016-05-29 DIAGNOSIS — N2581 Secondary hyperparathyroidism of renal origin: Secondary | ICD-10-CM | POA: Diagnosis not present

## 2016-05-29 DIAGNOSIS — N186 End stage renal disease: Secondary | ICD-10-CM | POA: Diagnosis not present

## 2016-05-29 DIAGNOSIS — D631 Anemia in chronic kidney disease: Secondary | ICD-10-CM | POA: Diagnosis not present

## 2016-05-31 DIAGNOSIS — N2581 Secondary hyperparathyroidism of renal origin: Secondary | ICD-10-CM | POA: Diagnosis not present

## 2016-05-31 DIAGNOSIS — D631 Anemia in chronic kidney disease: Secondary | ICD-10-CM | POA: Diagnosis not present

## 2016-05-31 DIAGNOSIS — N186 End stage renal disease: Secondary | ICD-10-CM | POA: Diagnosis not present

## 2016-06-03 DIAGNOSIS — N186 End stage renal disease: Secondary | ICD-10-CM | POA: Diagnosis not present

## 2016-06-03 DIAGNOSIS — N2581 Secondary hyperparathyroidism of renal origin: Secondary | ICD-10-CM | POA: Diagnosis not present

## 2016-06-03 DIAGNOSIS — D631 Anemia in chronic kidney disease: Secondary | ICD-10-CM | POA: Diagnosis not present

## 2016-06-05 DIAGNOSIS — D631 Anemia in chronic kidney disease: Secondary | ICD-10-CM | POA: Diagnosis not present

## 2016-06-05 DIAGNOSIS — N186 End stage renal disease: Secondary | ICD-10-CM | POA: Diagnosis not present

## 2016-06-05 DIAGNOSIS — N2581 Secondary hyperparathyroidism of renal origin: Secondary | ICD-10-CM | POA: Diagnosis not present

## 2016-06-07 DIAGNOSIS — D631 Anemia in chronic kidney disease: Secondary | ICD-10-CM | POA: Diagnosis not present

## 2016-06-07 DIAGNOSIS — N186 End stage renal disease: Secondary | ICD-10-CM | POA: Diagnosis not present

## 2016-06-07 DIAGNOSIS — N2581 Secondary hyperparathyroidism of renal origin: Secondary | ICD-10-CM | POA: Diagnosis not present

## 2016-06-10 DIAGNOSIS — N186 End stage renal disease: Secondary | ICD-10-CM | POA: Diagnosis not present

## 2016-06-10 DIAGNOSIS — D631 Anemia in chronic kidney disease: Secondary | ICD-10-CM | POA: Diagnosis not present

## 2016-06-10 DIAGNOSIS — N2581 Secondary hyperparathyroidism of renal origin: Secondary | ICD-10-CM | POA: Diagnosis not present

## 2016-06-12 DIAGNOSIS — N2581 Secondary hyperparathyroidism of renal origin: Secondary | ICD-10-CM | POA: Diagnosis not present

## 2016-06-12 DIAGNOSIS — N186 End stage renal disease: Secondary | ICD-10-CM | POA: Diagnosis not present

## 2016-06-12 DIAGNOSIS — D631 Anemia in chronic kidney disease: Secondary | ICD-10-CM | POA: Diagnosis not present

## 2016-06-14 DIAGNOSIS — N186 End stage renal disease: Secondary | ICD-10-CM | POA: Diagnosis not present

## 2016-06-14 DIAGNOSIS — N2581 Secondary hyperparathyroidism of renal origin: Secondary | ICD-10-CM | POA: Diagnosis not present

## 2016-06-14 DIAGNOSIS — D631 Anemia in chronic kidney disease: Secondary | ICD-10-CM | POA: Diagnosis not present

## 2016-06-16 DIAGNOSIS — D631 Anemia in chronic kidney disease: Secondary | ICD-10-CM | POA: Diagnosis not present

## 2016-06-16 DIAGNOSIS — N186 End stage renal disease: Secondary | ICD-10-CM | POA: Diagnosis not present

## 2016-06-16 DIAGNOSIS — N2581 Secondary hyperparathyroidism of renal origin: Secondary | ICD-10-CM | POA: Diagnosis not present

## 2016-06-19 DIAGNOSIS — D631 Anemia in chronic kidney disease: Secondary | ICD-10-CM | POA: Diagnosis not present

## 2016-06-19 DIAGNOSIS — N186 End stage renal disease: Secondary | ICD-10-CM | POA: Diagnosis not present

## 2016-06-19 DIAGNOSIS — N2581 Secondary hyperparathyroidism of renal origin: Secondary | ICD-10-CM | POA: Diagnosis not present

## 2016-06-21 DIAGNOSIS — N2581 Secondary hyperparathyroidism of renal origin: Secondary | ICD-10-CM | POA: Diagnosis not present

## 2016-06-21 DIAGNOSIS — N186 End stage renal disease: Secondary | ICD-10-CM | POA: Diagnosis not present

## 2016-06-21 DIAGNOSIS — D631 Anemia in chronic kidney disease: Secondary | ICD-10-CM | POA: Diagnosis not present

## 2016-06-23 DIAGNOSIS — N186 End stage renal disease: Secondary | ICD-10-CM | POA: Diagnosis not present

## 2016-06-23 DIAGNOSIS — N2581 Secondary hyperparathyroidism of renal origin: Secondary | ICD-10-CM | POA: Diagnosis not present

## 2016-06-23 DIAGNOSIS — Z992 Dependence on renal dialysis: Secondary | ICD-10-CM | POA: Diagnosis not present

## 2016-06-23 DIAGNOSIS — D631 Anemia in chronic kidney disease: Secondary | ICD-10-CM | POA: Diagnosis not present

## 2016-06-23 DIAGNOSIS — T8610 Unspecified complication of kidney transplant: Secondary | ICD-10-CM | POA: Diagnosis not present

## 2016-06-26 DIAGNOSIS — D509 Iron deficiency anemia, unspecified: Secondary | ICD-10-CM | POA: Diagnosis not present

## 2016-06-26 DIAGNOSIS — N2581 Secondary hyperparathyroidism of renal origin: Secondary | ICD-10-CM | POA: Diagnosis not present

## 2016-06-26 DIAGNOSIS — N186 End stage renal disease: Secondary | ICD-10-CM | POA: Diagnosis not present

## 2016-06-26 DIAGNOSIS — D631 Anemia in chronic kidney disease: Secondary | ICD-10-CM | POA: Diagnosis not present

## 2016-06-28 DIAGNOSIS — D631 Anemia in chronic kidney disease: Secondary | ICD-10-CM | POA: Diagnosis not present

## 2016-06-28 DIAGNOSIS — N186 End stage renal disease: Secondary | ICD-10-CM | POA: Diagnosis not present

## 2016-06-28 DIAGNOSIS — N2581 Secondary hyperparathyroidism of renal origin: Secondary | ICD-10-CM | POA: Diagnosis not present

## 2016-06-28 DIAGNOSIS — D509 Iron deficiency anemia, unspecified: Secondary | ICD-10-CM | POA: Diagnosis not present

## 2016-07-01 DIAGNOSIS — D631 Anemia in chronic kidney disease: Secondary | ICD-10-CM | POA: Diagnosis not present

## 2016-07-01 DIAGNOSIS — D509 Iron deficiency anemia, unspecified: Secondary | ICD-10-CM | POA: Diagnosis not present

## 2016-07-01 DIAGNOSIS — N186 End stage renal disease: Secondary | ICD-10-CM | POA: Diagnosis not present

## 2016-07-01 DIAGNOSIS — N2581 Secondary hyperparathyroidism of renal origin: Secondary | ICD-10-CM | POA: Diagnosis not present

## 2016-07-03 DIAGNOSIS — N2581 Secondary hyperparathyroidism of renal origin: Secondary | ICD-10-CM | POA: Diagnosis not present

## 2016-07-03 DIAGNOSIS — D631 Anemia in chronic kidney disease: Secondary | ICD-10-CM | POA: Diagnosis not present

## 2016-07-03 DIAGNOSIS — D509 Iron deficiency anemia, unspecified: Secondary | ICD-10-CM | POA: Diagnosis not present

## 2016-07-03 DIAGNOSIS — N186 End stage renal disease: Secondary | ICD-10-CM | POA: Diagnosis not present

## 2016-07-04 ENCOUNTER — Ambulatory Visit (INDEPENDENT_AMBULATORY_CARE_PROVIDER_SITE_OTHER): Payer: Medicare Other | Admitting: Internal Medicine

## 2016-07-04 ENCOUNTER — Encounter: Payer: Self-pay | Admitting: Internal Medicine

## 2016-07-04 VITALS — BP 190/128 | HR 88 | Ht 60.0 in | Wt 128.4 lb

## 2016-07-04 DIAGNOSIS — I10 Essential (primary) hypertension: Secondary | ICD-10-CM

## 2016-07-04 DIAGNOSIS — K259 Gastric ulcer, unspecified as acute or chronic, without hemorrhage or perforation: Secondary | ICD-10-CM

## 2016-07-04 NOTE — Patient Instructions (Signed)
   Decrease your pantoprazole to once daily.    Continue to check your blood pressure and if did doesn't come down please contact your kidney specialist.     I appreciate the opportunity to care for you. Silvano Rusk, MD, [facility]

## 2016-07-04 NOTE — Progress Notes (Signed)
Kerry Cook 31 y.o. Nov 10, 1985 [id number]  Assessment & Plan:   Encounter Diagnoses  Name Primary?  . Multiple gastric ulcers Yes  . Uncontrolled hypertension    1. Gastric ulcers - Decrease Protonix from twice daily to once daily - continue to reduce risk of recurrent ulcers 2. Uncontrolled HTN - Initial BP 190/128 today, repeat BP 180/120.  Given that this is not a new issue according to previous vitals and her hx, advised patient to recheck BP as soon as she's home and contact her kidney specialist if BP has not decreased. Not having symptoms Return as needed  Carlena Hurl, PA-S  UI saw the patient w/ Ms. Laural Eiland who served as a Education administrator. Gatha Mayer, MD, [facility]   Subjective:   Chief Complaint: Follow-up on gastric ulcers  HPI This is a 31 year old female with PMHx of ESRD on dialysis (M,W,F) presenting for follow-up of multiple gastric ulcers seen on EGD 04/01/16. Biopsies were benuign and no H pylori. Thought due to NSAID's.  She has been taking Protonix 40 mg twice daily.  She felt epigastric pain on 05/19/16 and went to [facility] ED, where she was incidentally found to be in a hypertensive crisis.  She was discharged 2 days later and since then she has not experienced any abdominal pain, nausea or vomiting.  She discontinued NSAID use in October.  Currently denies changes in vision or headache.   Current Meds  Medication Sig  . amLODipine (NORVASC) 10 MG tablet Take 1 tablet (10 mg total) by mouth every evening. (Patient taking differently: Take 10 mg by mouth daily. )  . calcium acetate (PHOSLO) 667 MG capsule Take 2,001 mg by mouth 3 (three) times daily with meals.  . cloNIDine (CATAPRES) 0.1 MG tablet Take 0.1 mg by mouth 2 (two) times daily.  . [facility] ANTI-ITCH lotion Apply 1 application topically daily as needed for itching.  . labetalol (NORMODYNE) 200 MG tablet Take 1 tablet (200 mg total) by mouth 2 (two) times daily.  Marland Kitchen losartan (COZAAR) 100 MG tablet Take 1  tablet (100 mg total) by mouth every evening.  . multivitamin (RENA-VIT) TABS tablet Take 1 tablet by mouth at bedtime.  . ondansetron (ZOFRAN ODT) 8 MG disintegrating tablet Take 1 tablet (8 mg total) by mouth every 8 (eight) hours as needed for nausea or vomiting.  . pantoprazole (PROTONIX) 40 MG tablet Take 40 mg by mouth daily.  . [DISCONTINUED] pantoprazole (PROTONIX) 40 MG tablet Take 1 tablet (40 mg total) by mouth 2 (two) times daily. (Patient taking differently: Take 40 mg by mouth daily. )   No Known Allergies  Past Medical History:  Diagnosis Date  . Chronic kidney disease    previous hx dialysis  . Dialysis patient ([place])   . History of kidney transplant 2012   kidney failure due to hypertension  . Hypertension   . Peptic ulcer    Past Surgical History:  Procedure Laterality Date  . ESOPHAGOGASTRODUODENOSCOPY N/A 04/01/2016   Procedure: ESOPHAGOGASTRODUODENOSCOPY (EGD);  Surgeon: Gatha Mayer, MD;  Location: [facility] ENDOSCOPY;  Service: Endoscopy;  Laterality: N/A;  . KIDNEY TRANSPLANT Bilateral 2012   Social History   Social History  . Marital status: Single    Spouse name: N/A  . Number of children: 0  . Years of education: N/A   Occupational History  . Daycare    Social History Main Topics  . Smoking status: Never Smoker  . Smokeless tobacco: Never Used  .  Alcohol use No  . Drug use: No  . Sexual activity: Yes    Partners: Male    Birth control/ protection: Condom, None   Other Topics Concern  . Not on file   Social History Narrative  . No narrative on file    Review of Systems All other ROS negative   Objective:   Physical Exam BP (!) 190/128   Pulse 88   Ht 5' (1.524 m)   Wt 128 lb 6 oz (58.2 kg)   BMI 25.07 kg/m  GA: Well developed, well nourished female in no acute distress Heart: RRR, no m/r/g. No LE edema Skin: Warm and dry, left upper extremity AV fistula Lungs: CTAB Abdomen: Soft, non-tender, non-distended, no masses. Normoactive  BS Neuro: Alert and oriented x 3 Psych: Mood and affect normal

## 2016-07-05 DIAGNOSIS — D631 Anemia in chronic kidney disease: Secondary | ICD-10-CM | POA: Diagnosis not present

## 2016-07-05 DIAGNOSIS — D509 Iron deficiency anemia, unspecified: Secondary | ICD-10-CM | POA: Diagnosis not present

## 2016-07-05 DIAGNOSIS — N2581 Secondary hyperparathyroidism of renal origin: Secondary | ICD-10-CM | POA: Diagnosis not present

## 2016-07-05 DIAGNOSIS — N186 End stage renal disease: Secondary | ICD-10-CM | POA: Diagnosis not present

## 2016-07-08 DIAGNOSIS — N2581 Secondary hyperparathyroidism of renal origin: Secondary | ICD-10-CM | POA: Diagnosis not present

## 2016-07-08 DIAGNOSIS — D631 Anemia in chronic kidney disease: Secondary | ICD-10-CM | POA: Diagnosis not present

## 2016-07-08 DIAGNOSIS — D509 Iron deficiency anemia, unspecified: Secondary | ICD-10-CM | POA: Diagnosis not present

## 2016-07-08 DIAGNOSIS — N186 End stage renal disease: Secondary | ICD-10-CM | POA: Diagnosis not present

## 2016-07-10 DIAGNOSIS — N186 End stage renal disease: Secondary | ICD-10-CM | POA: Diagnosis not present

## 2016-07-10 DIAGNOSIS — D509 Iron deficiency anemia, unspecified: Secondary | ICD-10-CM | POA: Diagnosis not present

## 2016-07-10 DIAGNOSIS — D631 Anemia in chronic kidney disease: Secondary | ICD-10-CM | POA: Diagnosis not present

## 2016-07-10 DIAGNOSIS — N2581 Secondary hyperparathyroidism of renal origin: Secondary | ICD-10-CM | POA: Diagnosis not present

## 2016-07-12 DIAGNOSIS — D509 Iron deficiency anemia, unspecified: Secondary | ICD-10-CM | POA: Diagnosis not present

## 2016-07-12 DIAGNOSIS — N2581 Secondary hyperparathyroidism of renal origin: Secondary | ICD-10-CM | POA: Diagnosis not present

## 2016-07-12 DIAGNOSIS — D631 Anemia in chronic kidney disease: Secondary | ICD-10-CM | POA: Diagnosis not present

## 2016-07-12 DIAGNOSIS — N186 End stage renal disease: Secondary | ICD-10-CM | POA: Diagnosis not present

## 2016-07-15 DIAGNOSIS — D631 Anemia in chronic kidney disease: Secondary | ICD-10-CM | POA: Diagnosis not present

## 2016-07-15 DIAGNOSIS — D509 Iron deficiency anemia, unspecified: Secondary | ICD-10-CM | POA: Diagnosis not present

## 2016-07-15 DIAGNOSIS — N2581 Secondary hyperparathyroidism of renal origin: Secondary | ICD-10-CM | POA: Diagnosis not present

## 2016-07-15 DIAGNOSIS — N186 End stage renal disease: Secondary | ICD-10-CM | POA: Diagnosis not present

## 2016-07-17 DIAGNOSIS — N2581 Secondary hyperparathyroidism of renal origin: Secondary | ICD-10-CM | POA: Diagnosis not present

## 2016-07-17 DIAGNOSIS — D631 Anemia in chronic kidney disease: Secondary | ICD-10-CM | POA: Diagnosis not present

## 2016-07-17 DIAGNOSIS — D509 Iron deficiency anemia, unspecified: Secondary | ICD-10-CM | POA: Diagnosis not present

## 2016-07-17 DIAGNOSIS — N186 End stage renal disease: Secondary | ICD-10-CM | POA: Diagnosis not present

## 2016-07-19 DIAGNOSIS — N186 End stage renal disease: Secondary | ICD-10-CM | POA: Diagnosis not present

## 2016-07-19 DIAGNOSIS — D631 Anemia in chronic kidney disease: Secondary | ICD-10-CM | POA: Diagnosis not present

## 2016-07-19 DIAGNOSIS — D509 Iron deficiency anemia, unspecified: Secondary | ICD-10-CM | POA: Diagnosis not present

## 2016-07-19 DIAGNOSIS — N2581 Secondary hyperparathyroidism of renal origin: Secondary | ICD-10-CM | POA: Diagnosis not present

## 2016-07-22 DIAGNOSIS — N186 End stage renal disease: Secondary | ICD-10-CM | POA: Diagnosis not present

## 2016-07-22 DIAGNOSIS — D631 Anemia in chronic kidney disease: Secondary | ICD-10-CM | POA: Diagnosis not present

## 2016-07-22 DIAGNOSIS — N2581 Secondary hyperparathyroidism of renal origin: Secondary | ICD-10-CM | POA: Diagnosis not present

## 2016-07-22 DIAGNOSIS — D509 Iron deficiency anemia, unspecified: Secondary | ICD-10-CM | POA: Diagnosis not present

## 2016-07-24 DIAGNOSIS — D509 Iron deficiency anemia, unspecified: Secondary | ICD-10-CM | POA: Diagnosis not present

## 2016-07-24 DIAGNOSIS — Z992 Dependence on renal dialysis: Secondary | ICD-10-CM | POA: Diagnosis not present

## 2016-07-24 DIAGNOSIS — T8610 Unspecified complication of kidney transplant: Secondary | ICD-10-CM | POA: Diagnosis not present

## 2016-07-24 DIAGNOSIS — D631 Anemia in chronic kidney disease: Secondary | ICD-10-CM | POA: Diagnosis not present

## 2016-07-24 DIAGNOSIS — N186 End stage renal disease: Secondary | ICD-10-CM | POA: Diagnosis not present

## 2016-07-24 DIAGNOSIS — N2581 Secondary hyperparathyroidism of renal origin: Secondary | ICD-10-CM | POA: Diagnosis not present

## 2016-07-26 DIAGNOSIS — N186 End stage renal disease: Secondary | ICD-10-CM | POA: Diagnosis not present

## 2016-07-26 DIAGNOSIS — D631 Anemia in chronic kidney disease: Secondary | ICD-10-CM | POA: Diagnosis not present

## 2016-07-26 DIAGNOSIS — N2581 Secondary hyperparathyroidism of renal origin: Secondary | ICD-10-CM | POA: Diagnosis not present

## 2016-07-26 DIAGNOSIS — D509 Iron deficiency anemia, unspecified: Secondary | ICD-10-CM | POA: Diagnosis not present

## 2016-07-29 DIAGNOSIS — D631 Anemia in chronic kidney disease: Secondary | ICD-10-CM | POA: Diagnosis not present

## 2016-07-29 DIAGNOSIS — N186 End stage renal disease: Secondary | ICD-10-CM | POA: Diagnosis not present

## 2016-07-29 DIAGNOSIS — D509 Iron deficiency anemia, unspecified: Secondary | ICD-10-CM | POA: Diagnosis not present

## 2016-07-29 DIAGNOSIS — N2581 Secondary hyperparathyroidism of renal origin: Secondary | ICD-10-CM | POA: Diagnosis not present

## 2016-07-31 DIAGNOSIS — D509 Iron deficiency anemia, unspecified: Secondary | ICD-10-CM | POA: Diagnosis not present

## 2016-07-31 DIAGNOSIS — N186 End stage renal disease: Secondary | ICD-10-CM | POA: Diagnosis not present

## 2016-07-31 DIAGNOSIS — D631 Anemia in chronic kidney disease: Secondary | ICD-10-CM | POA: Diagnosis not present

## 2016-07-31 DIAGNOSIS — N2581 Secondary hyperparathyroidism of renal origin: Secondary | ICD-10-CM | POA: Diagnosis not present

## 2016-08-02 DIAGNOSIS — N186 End stage renal disease: Secondary | ICD-10-CM | POA: Diagnosis not present

## 2016-08-02 DIAGNOSIS — D631 Anemia in chronic kidney disease: Secondary | ICD-10-CM | POA: Diagnosis not present

## 2016-08-02 DIAGNOSIS — N2581 Secondary hyperparathyroidism of renal origin: Secondary | ICD-10-CM | POA: Diagnosis not present

## 2016-08-02 DIAGNOSIS — D509 Iron deficiency anemia, unspecified: Secondary | ICD-10-CM | POA: Diagnosis not present

## 2016-08-04 IMAGING — US US OB TRANSVAGINAL
1 series · 13 of 28 positions shown · non-contrast
Comparison: 05/23/2014

CLINICAL DATA: High risk pregnancy.  End-stage renal disease.

EXAM:
TRANSVAGINAL OB ULTRASOUND
TECHNIQUE: Transvaginal ultrasound was performed for complete evaluation of the
gestation as well as the maternal uterus, adnexal regions, and
pelvic cul-de-sac.

[Series 1: us ob transvaginal · 13 of 51 slices shown]
[im 2/51]
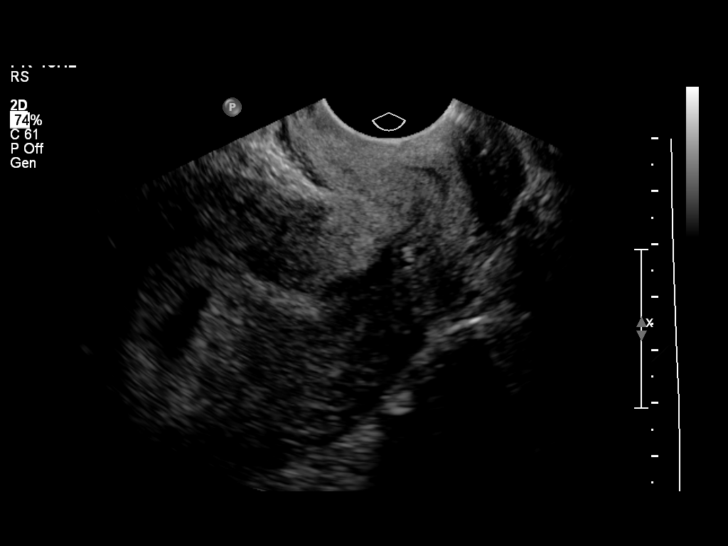
[im 6/51]
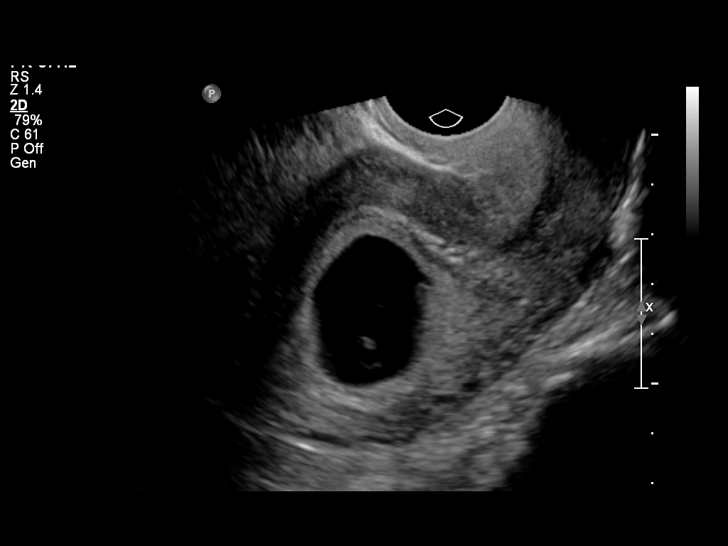
[im 10/51]
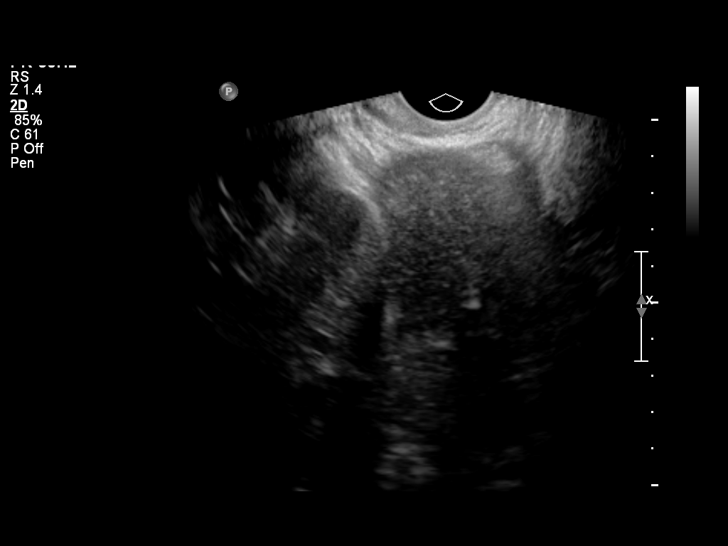
[im 13/51]
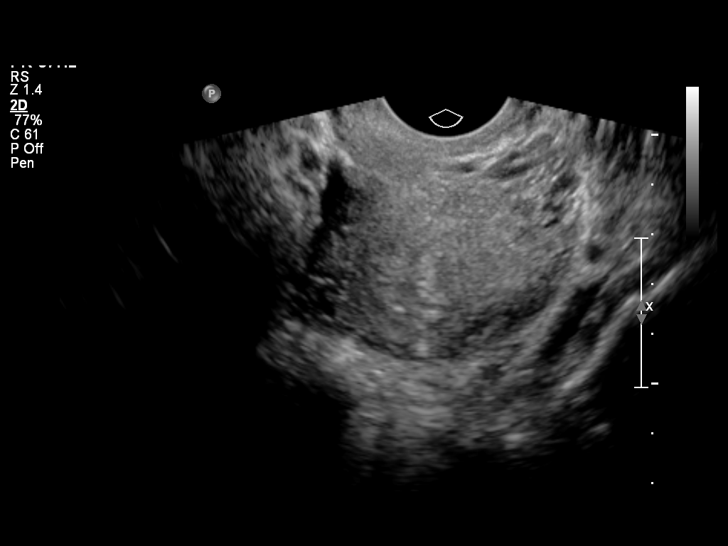
[im 17/51]
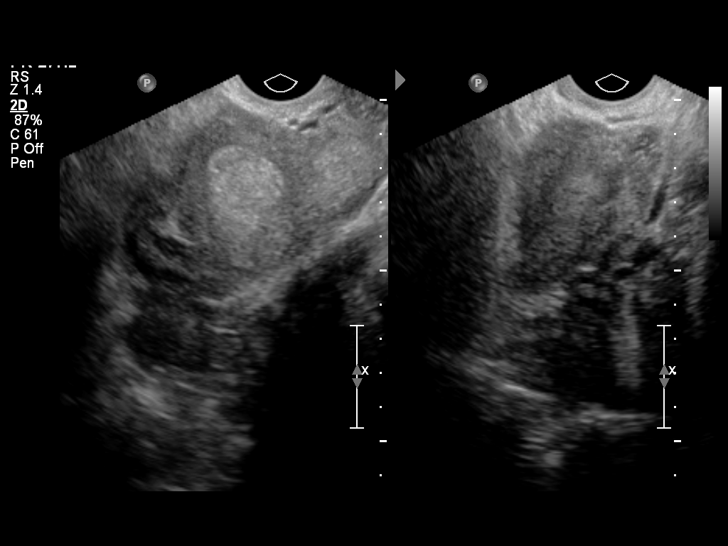
[im 21/51]
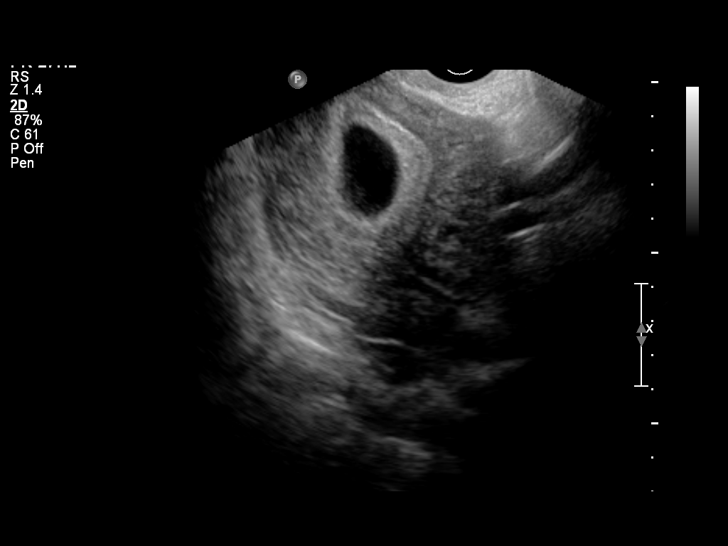
[im 26/51]
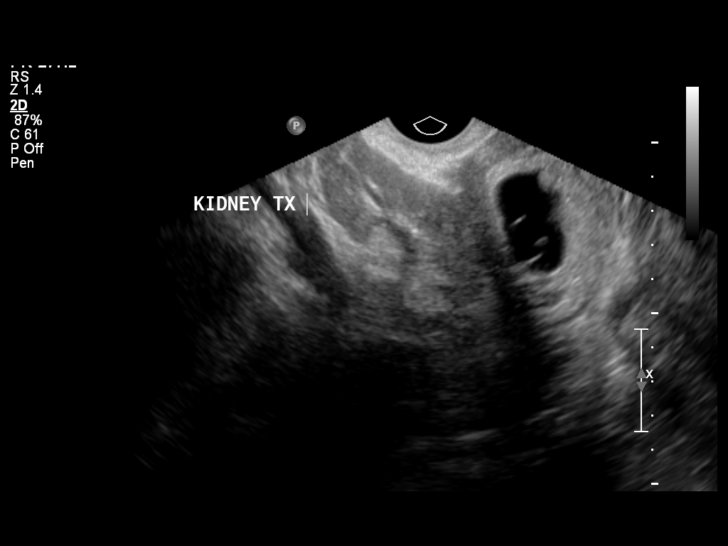
[im 30/51]
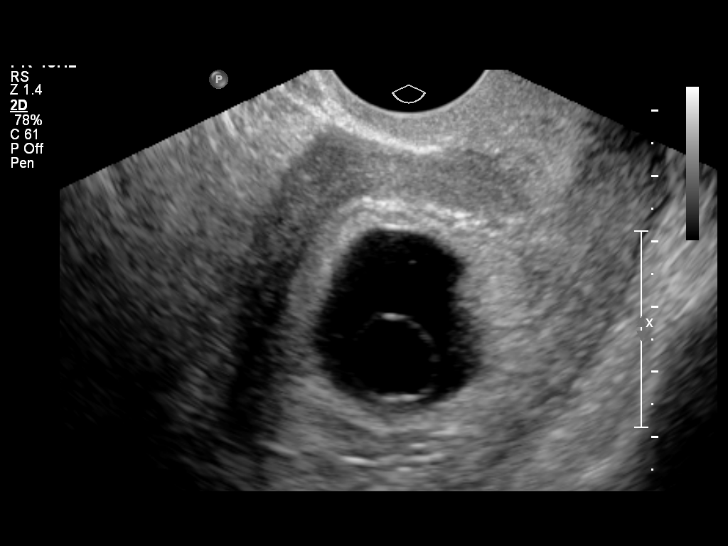
[im 34/51]
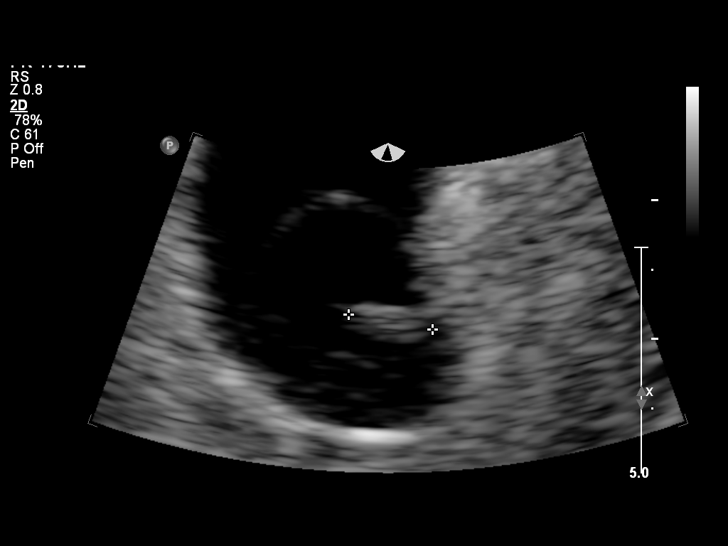
[im 38/51]
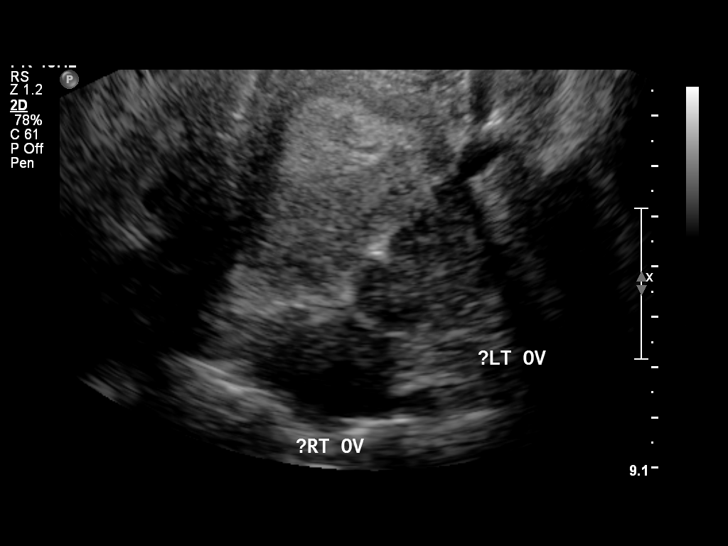
[im 41/51]
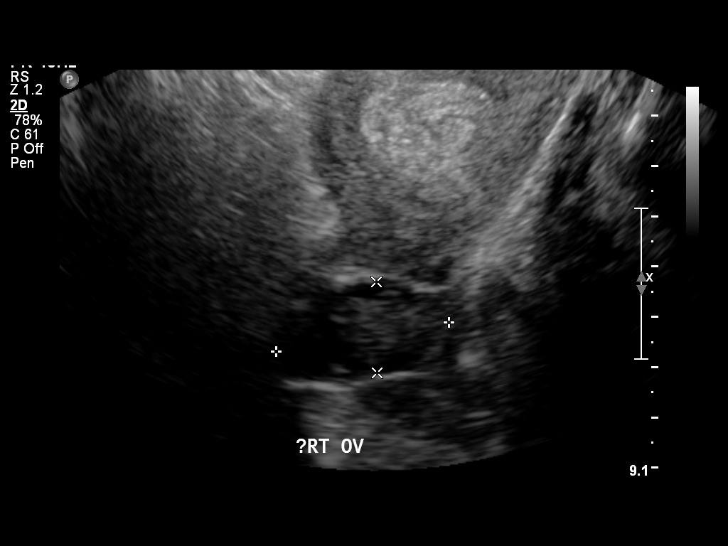
[im 45/51]
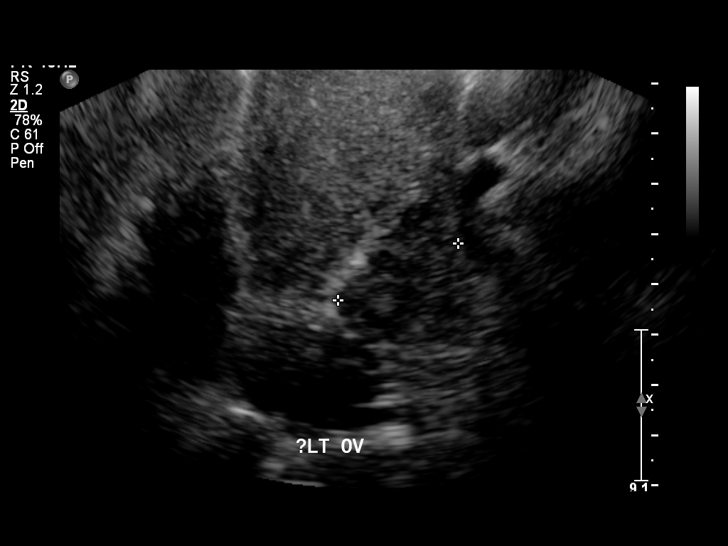
[im 49/51]
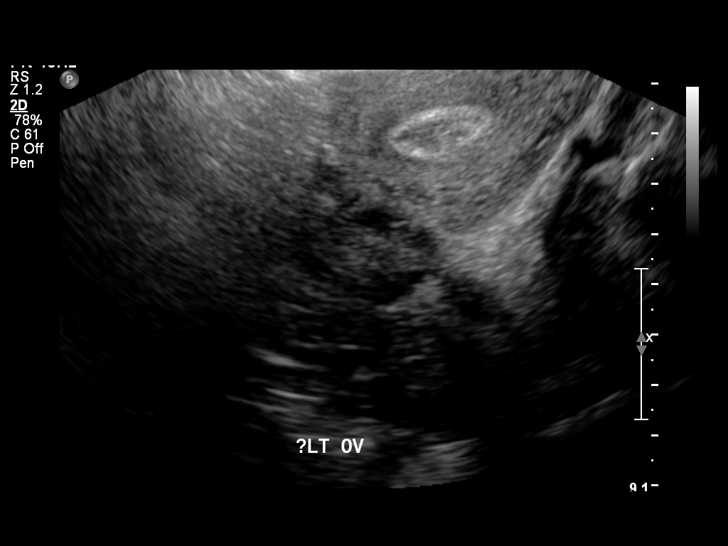

[13 of 28 positions shown; findings below may reference images not displayed]

FINDINGS: Intrauterine gestational sac: Single

Yolk sac:  Present, appears hydropic

Embryo:  Single

Cardiac Activity: No

Heart Rate: None bpm

CRL:   6.4  mm   6 w 4 d                  US EDC: 01/27/2015

Maternal uterus/adnexae:

Subchorionic hemorrhage: None

Right ovary: Normal

Left ovary: Normal

Other :Renal graft identified within the right iliac fossa

Free fluid:  None
IMPRESSION: 1. Single intrauterine gestation without evidence for cardiac
activity. Findings are suspicious but not yet definitive for failed
pregnancy. Recommend follow-up US in 10-14 days for definitive
diagnosis. This recommendation follows SRU consensus guidelines:
Diagnostic Criteria for Nonviable Pregnancy Early in the First
Trimester. N Engl J Med 4374; [DATE].
2. These results will be called to the ordering clinician or
representative by the Radiologist Assistant, and communication
documented in the PACS or zVision Dashboard.

## 2016-08-05 DIAGNOSIS — N186 End stage renal disease: Secondary | ICD-10-CM | POA: Diagnosis not present

## 2016-08-05 DIAGNOSIS — D509 Iron deficiency anemia, unspecified: Secondary | ICD-10-CM | POA: Diagnosis not present

## 2016-08-05 DIAGNOSIS — D631 Anemia in chronic kidney disease: Secondary | ICD-10-CM | POA: Diagnosis not present

## 2016-08-05 DIAGNOSIS — N2581 Secondary hyperparathyroidism of renal origin: Secondary | ICD-10-CM | POA: Diagnosis not present

## 2016-08-07 DIAGNOSIS — N2581 Secondary hyperparathyroidism of renal origin: Secondary | ICD-10-CM | POA: Diagnosis not present

## 2016-08-07 DIAGNOSIS — D509 Iron deficiency anemia, unspecified: Secondary | ICD-10-CM | POA: Diagnosis not present

## 2016-08-07 DIAGNOSIS — D631 Anemia in chronic kidney disease: Secondary | ICD-10-CM | POA: Diagnosis not present

## 2016-08-07 DIAGNOSIS — N186 End stage renal disease: Secondary | ICD-10-CM | POA: Diagnosis not present

## 2016-08-09 DIAGNOSIS — D509 Iron deficiency anemia, unspecified: Secondary | ICD-10-CM | POA: Diagnosis not present

## 2016-08-09 DIAGNOSIS — D631 Anemia in chronic kidney disease: Secondary | ICD-10-CM | POA: Diagnosis not present

## 2016-08-09 DIAGNOSIS — N186 End stage renal disease: Secondary | ICD-10-CM | POA: Diagnosis not present

## 2016-08-09 DIAGNOSIS — N2581 Secondary hyperparathyroidism of renal origin: Secondary | ICD-10-CM | POA: Diagnosis not present

## 2016-08-12 DIAGNOSIS — D631 Anemia in chronic kidney disease: Secondary | ICD-10-CM | POA: Diagnosis not present

## 2016-08-12 DIAGNOSIS — N186 End stage renal disease: Secondary | ICD-10-CM | POA: Diagnosis not present

## 2016-08-12 DIAGNOSIS — D509 Iron deficiency anemia, unspecified: Secondary | ICD-10-CM | POA: Diagnosis not present

## 2016-08-12 DIAGNOSIS — N2581 Secondary hyperparathyroidism of renal origin: Secondary | ICD-10-CM | POA: Diagnosis not present

## 2016-08-14 DIAGNOSIS — D509 Iron deficiency anemia, unspecified: Secondary | ICD-10-CM | POA: Diagnosis not present

## 2016-08-14 DIAGNOSIS — N2581 Secondary hyperparathyroidism of renal origin: Secondary | ICD-10-CM | POA: Diagnosis not present

## 2016-08-14 DIAGNOSIS — D631 Anemia in chronic kidney disease: Secondary | ICD-10-CM | POA: Diagnosis not present

## 2016-08-14 DIAGNOSIS — N186 End stage renal disease: Secondary | ICD-10-CM | POA: Diagnosis not present

## 2016-08-16 DIAGNOSIS — N186 End stage renal disease: Secondary | ICD-10-CM | POA: Diagnosis not present

## 2016-08-16 DIAGNOSIS — D631 Anemia in chronic kidney disease: Secondary | ICD-10-CM | POA: Diagnosis not present

## 2016-08-16 DIAGNOSIS — D509 Iron deficiency anemia, unspecified: Secondary | ICD-10-CM | POA: Diagnosis not present

## 2016-08-16 DIAGNOSIS — N2581 Secondary hyperparathyroidism of renal origin: Secondary | ICD-10-CM | POA: Diagnosis not present

## 2016-08-19 DIAGNOSIS — D509 Iron deficiency anemia, unspecified: Secondary | ICD-10-CM | POA: Diagnosis not present

## 2016-08-19 DIAGNOSIS — N186 End stage renal disease: Secondary | ICD-10-CM | POA: Diagnosis not present

## 2016-08-19 DIAGNOSIS — N2581 Secondary hyperparathyroidism of renal origin: Secondary | ICD-10-CM | POA: Diagnosis not present

## 2016-08-19 DIAGNOSIS — D631 Anemia in chronic kidney disease: Secondary | ICD-10-CM | POA: Diagnosis not present

## 2016-08-21 DIAGNOSIS — N2581 Secondary hyperparathyroidism of renal origin: Secondary | ICD-10-CM | POA: Diagnosis not present

## 2016-08-21 DIAGNOSIS — D509 Iron deficiency anemia, unspecified: Secondary | ICD-10-CM | POA: Diagnosis not present

## 2016-08-21 DIAGNOSIS — D631 Anemia in chronic kidney disease: Secondary | ICD-10-CM | POA: Diagnosis not present

## 2016-08-21 DIAGNOSIS — N186 End stage renal disease: Secondary | ICD-10-CM | POA: Diagnosis not present

## 2016-08-21 DIAGNOSIS — T8610 Unspecified complication of kidney transplant: Secondary | ICD-10-CM | POA: Diagnosis not present

## 2016-08-21 DIAGNOSIS — Z992 Dependence on renal dialysis: Secondary | ICD-10-CM | POA: Diagnosis not present

## 2016-08-23 DIAGNOSIS — N186 End stage renal disease: Secondary | ICD-10-CM | POA: Diagnosis not present

## 2016-08-23 DIAGNOSIS — D631 Anemia in chronic kidney disease: Secondary | ICD-10-CM | POA: Diagnosis not present

## 2016-08-23 DIAGNOSIS — N2581 Secondary hyperparathyroidism of renal origin: Secondary | ICD-10-CM | POA: Diagnosis not present

## 2016-08-23 IMAGING — US US OB TRANSVAGINAL
1 series · 14 of 24 positions shown · non-contrast
Comparison: Pelvic ultrasound performed 06/07/2014

CLINICAL DATA: Pelvic pain for 2 days. Vaginal bleeding. Initial
encounter.

EXAM:
TRANSVAGINAL OB ULTRASOUND
TECHNIQUE: Transvaginal ultrasound was performed for complete evaluation of the
gestation as well as the maternal uterus, adnexal regions, and
pelvic cul-de-sac.

[Series 1: us ob transvaginal · 0.20mm/px · 24 acquisitions, 14 frames shown]
[im 1/24]
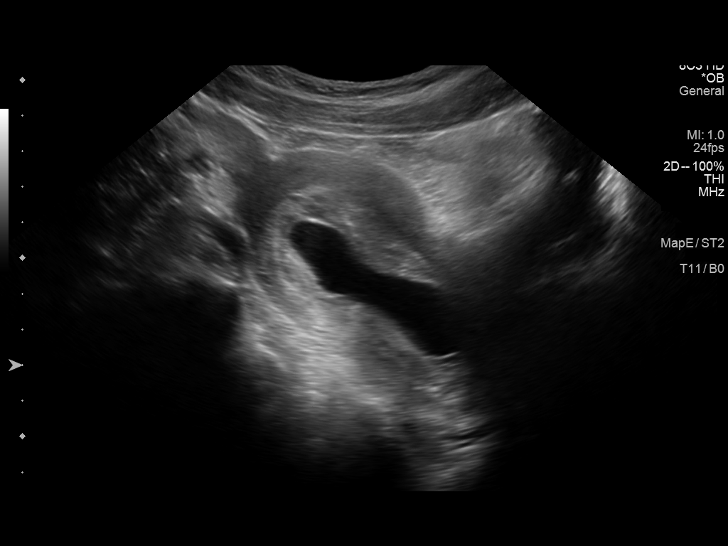
[im 3/24]
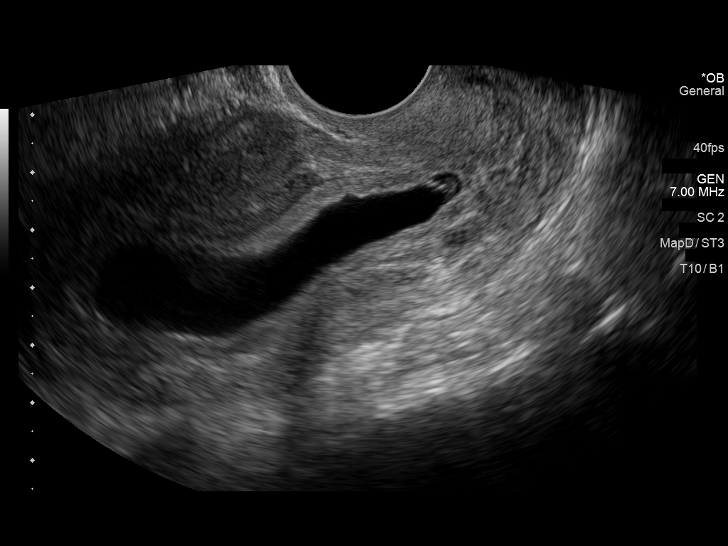
[im 5/24]
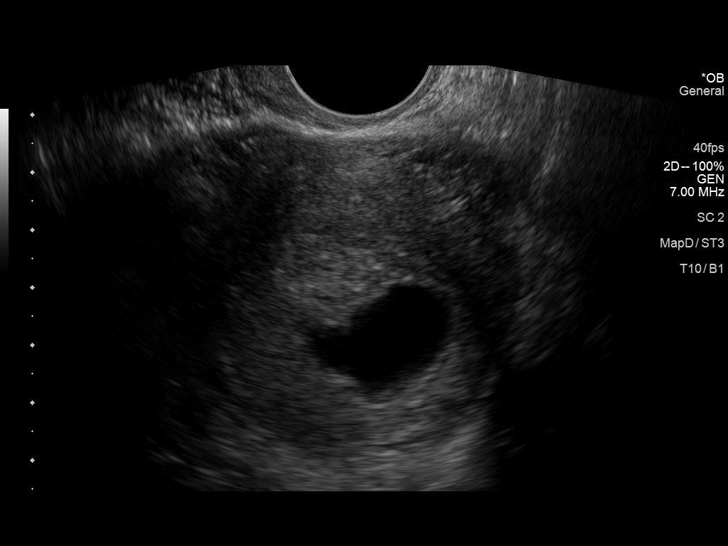
[im 7/24]
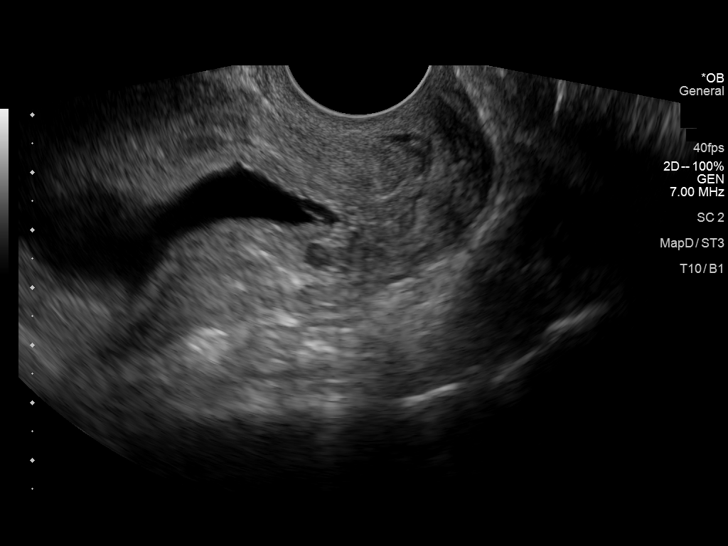
[im 8/24]
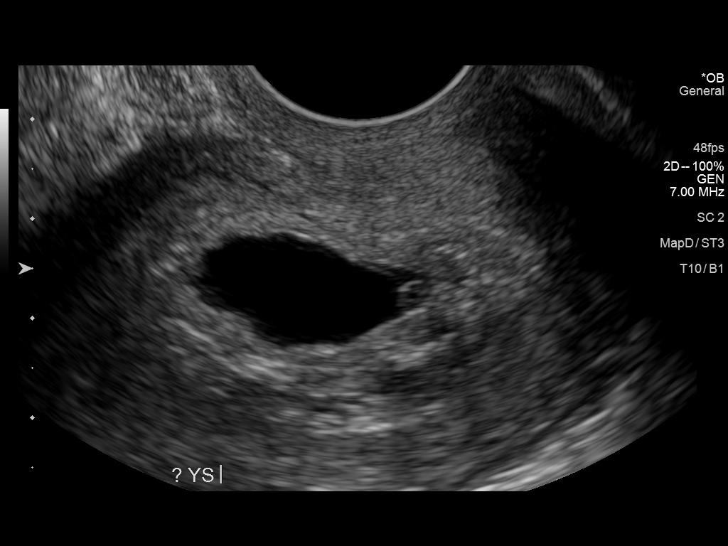
[im 10/24]
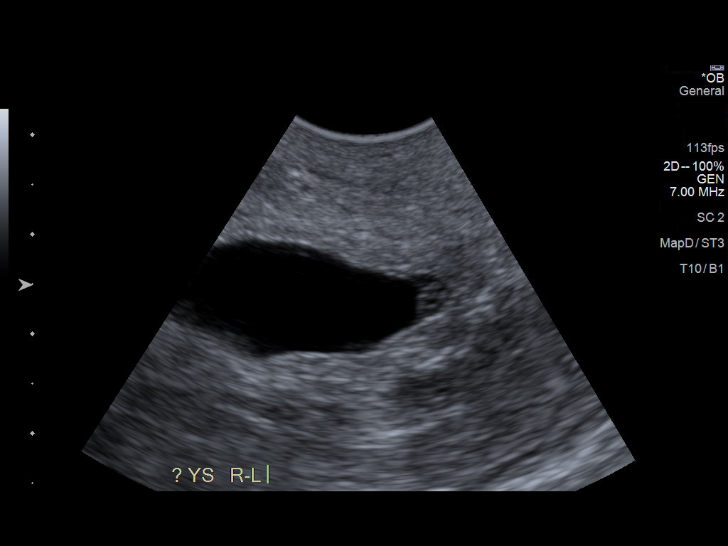
[im 12/24]
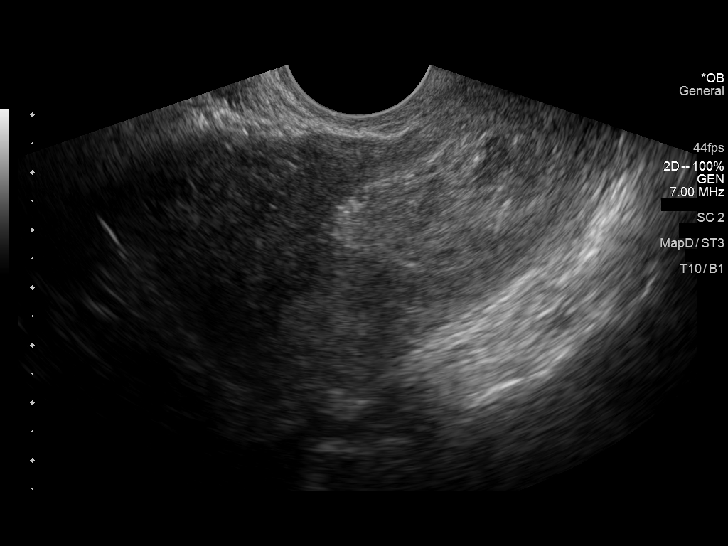
[im 13/24]
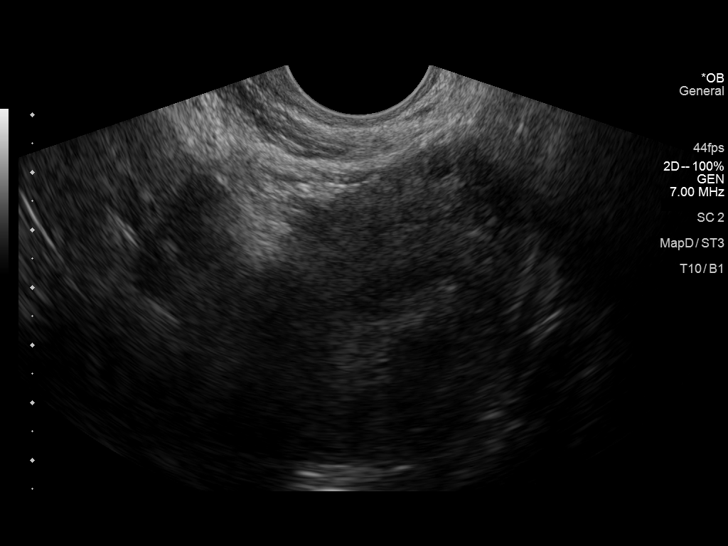
[im 15/24]
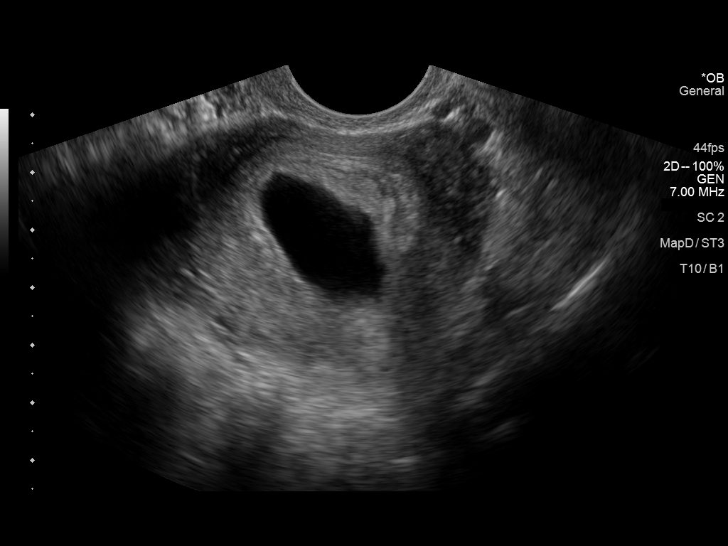
[im 17/24]
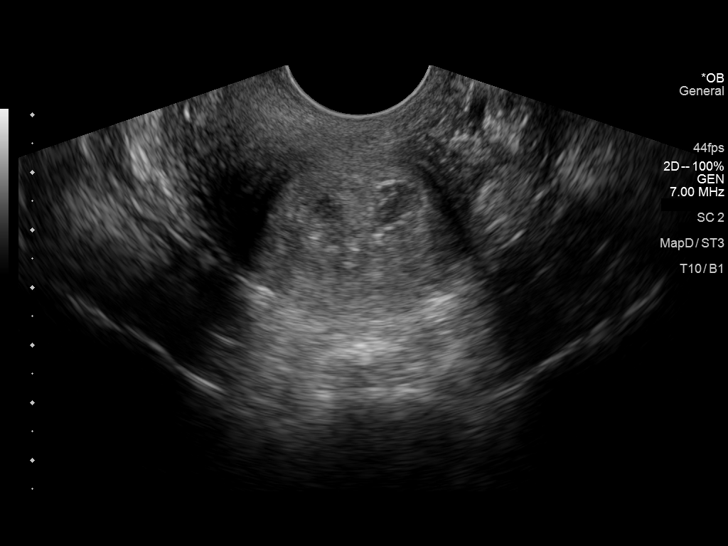
[im 19/24]
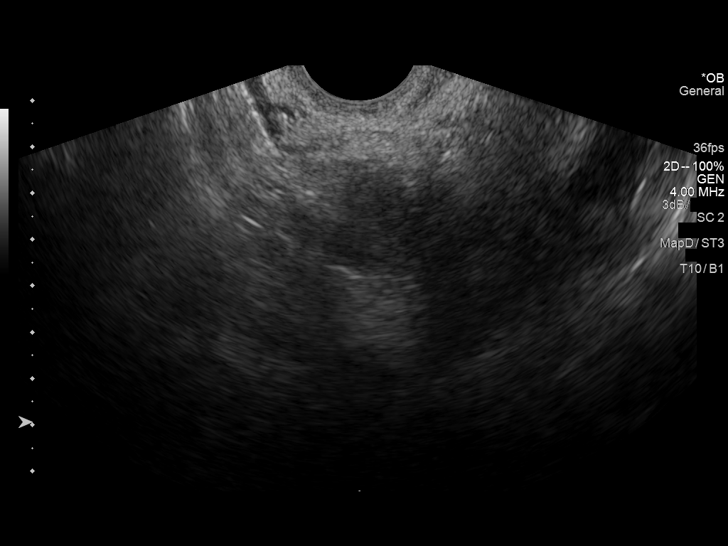
[im 20/24]
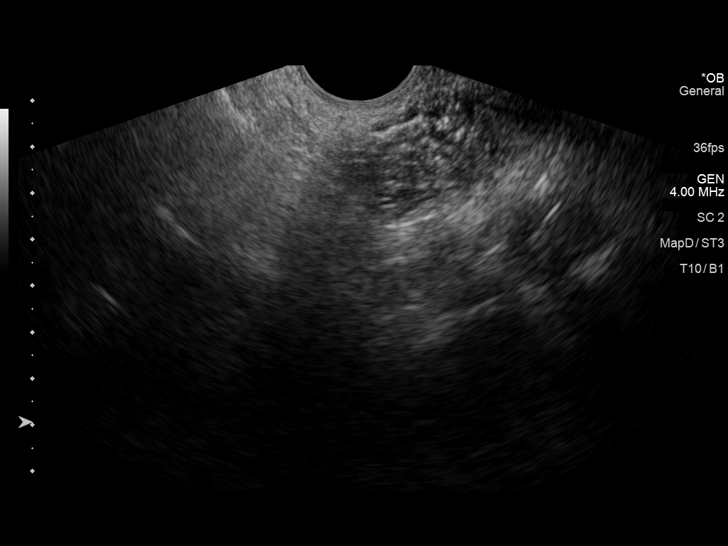
[im 22/24]
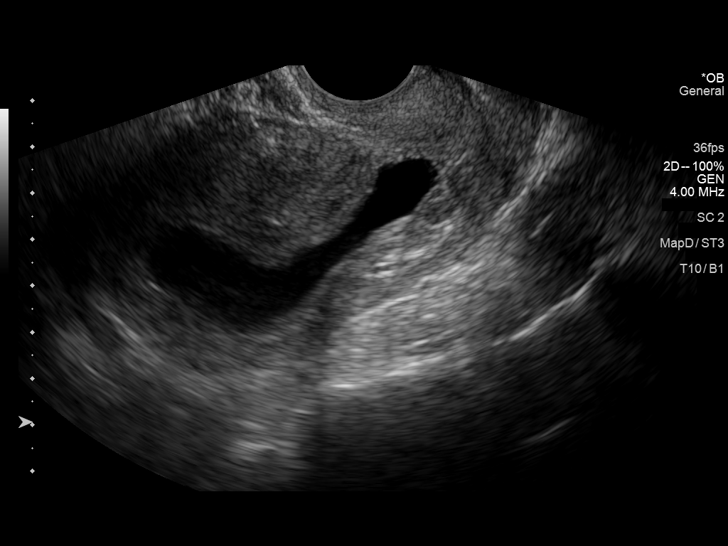
[im 24/24]
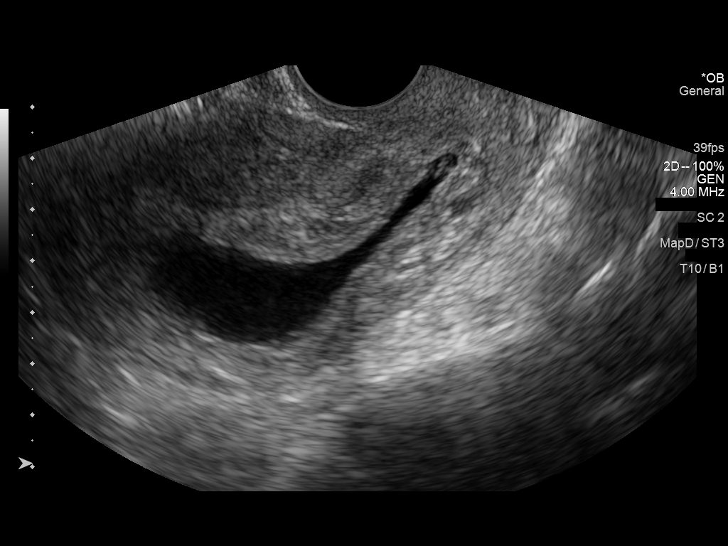

[14 of 24 positions shown; findings below may reference images not displayed]

FINDINGS: Intrauterine gestational sac: Visualized; elongated and irregular in
appearance, extending across the cervix.

Yolk sac:  Not definitely seen.

Embryo:  None seen.

Cardiac Activity: N/A

MSD: 3.65 cm   9 w   0  d

Maternal uterus/adnexae: The uterus is otherwise unremarkable. No
subchorionic hemorrhage is seen.

The right ovary is not visualized. The left ovary is grossly
unremarkable, though difficult to fully assess. The patient's
right-sided transplant kidney is incidentally noted.

No free fluid is seen within the pelvic cul-de-sac.
IMPRESSION: Elongated gestational sac noted extending across the cervix. No
embryo seen. Findings are compatible with spontaneous abortion in
progress.

## 2016-08-26 DIAGNOSIS — D631 Anemia in chronic kidney disease: Secondary | ICD-10-CM | POA: Diagnosis not present

## 2016-08-26 DIAGNOSIS — N186 End stage renal disease: Secondary | ICD-10-CM | POA: Diagnosis not present

## 2016-08-26 DIAGNOSIS — N2581 Secondary hyperparathyroidism of renal origin: Secondary | ICD-10-CM | POA: Diagnosis not present

## 2016-08-28 DIAGNOSIS — N186 End stage renal disease: Secondary | ICD-10-CM | POA: Diagnosis not present

## 2016-08-28 DIAGNOSIS — N2581 Secondary hyperparathyroidism of renal origin: Secondary | ICD-10-CM | POA: Diagnosis not present

## 2016-08-28 DIAGNOSIS — D631 Anemia in chronic kidney disease: Secondary | ICD-10-CM | POA: Diagnosis not present

## 2016-08-30 DIAGNOSIS — N2581 Secondary hyperparathyroidism of renal origin: Secondary | ICD-10-CM | POA: Diagnosis not present

## 2016-08-30 DIAGNOSIS — D631 Anemia in chronic kidney disease: Secondary | ICD-10-CM | POA: Diagnosis not present

## 2016-08-30 DIAGNOSIS — N186 End stage renal disease: Secondary | ICD-10-CM | POA: Diagnosis not present

## 2016-09-02 DIAGNOSIS — D631 Anemia in chronic kidney disease: Secondary | ICD-10-CM | POA: Diagnosis not present

## 2016-09-02 DIAGNOSIS — N2581 Secondary hyperparathyroidism of renal origin: Secondary | ICD-10-CM | POA: Diagnosis not present

## 2016-09-02 DIAGNOSIS — N186 End stage renal disease: Secondary | ICD-10-CM | POA: Diagnosis not present

## 2016-09-04 DIAGNOSIS — N186 End stage renal disease: Secondary | ICD-10-CM | POA: Diagnosis not present

## 2016-09-04 DIAGNOSIS — D631 Anemia in chronic kidney disease: Secondary | ICD-10-CM | POA: Diagnosis not present

## 2016-09-04 DIAGNOSIS — N2581 Secondary hyperparathyroidism of renal origin: Secondary | ICD-10-CM | POA: Diagnosis not present

## 2016-09-06 DIAGNOSIS — D631 Anemia in chronic kidney disease: Secondary | ICD-10-CM | POA: Diagnosis not present

## 2016-09-06 DIAGNOSIS — N186 End stage renal disease: Secondary | ICD-10-CM | POA: Diagnosis not present

## 2016-09-06 DIAGNOSIS — N2581 Secondary hyperparathyroidism of renal origin: Secondary | ICD-10-CM | POA: Diagnosis not present

## 2016-09-09 DIAGNOSIS — D631 Anemia in chronic kidney disease: Secondary | ICD-10-CM | POA: Diagnosis not present

## 2016-09-09 DIAGNOSIS — N2581 Secondary hyperparathyroidism of renal origin: Secondary | ICD-10-CM | POA: Diagnosis not present

## 2016-09-09 DIAGNOSIS — N186 End stage renal disease: Secondary | ICD-10-CM | POA: Diagnosis not present

## 2016-09-11 DIAGNOSIS — N2581 Secondary hyperparathyroidism of renal origin: Secondary | ICD-10-CM | POA: Diagnosis not present

## 2016-09-11 DIAGNOSIS — N186 End stage renal disease: Secondary | ICD-10-CM | POA: Diagnosis not present

## 2016-09-11 DIAGNOSIS — D631 Anemia in chronic kidney disease: Secondary | ICD-10-CM | POA: Diagnosis not present

## 2016-09-13 DIAGNOSIS — N186 End stage renal disease: Secondary | ICD-10-CM | POA: Diagnosis not present

## 2016-09-13 DIAGNOSIS — N2581 Secondary hyperparathyroidism of renal origin: Secondary | ICD-10-CM | POA: Diagnosis not present

## 2016-09-13 DIAGNOSIS — D631 Anemia in chronic kidney disease: Secondary | ICD-10-CM | POA: Diagnosis not present

## 2016-09-16 DIAGNOSIS — N186 End stage renal disease: Secondary | ICD-10-CM | POA: Diagnosis not present

## 2016-09-16 DIAGNOSIS — N2581 Secondary hyperparathyroidism of renal origin: Secondary | ICD-10-CM | POA: Diagnosis not present

## 2016-09-16 DIAGNOSIS — D631 Anemia in chronic kidney disease: Secondary | ICD-10-CM | POA: Diagnosis not present

## 2016-09-18 DIAGNOSIS — N186 End stage renal disease: Secondary | ICD-10-CM | POA: Diagnosis not present

## 2016-09-18 DIAGNOSIS — N2581 Secondary hyperparathyroidism of renal origin: Secondary | ICD-10-CM | POA: Diagnosis not present

## 2016-09-18 DIAGNOSIS — D631 Anemia in chronic kidney disease: Secondary | ICD-10-CM | POA: Diagnosis not present

## 2016-09-20 DIAGNOSIS — D631 Anemia in chronic kidney disease: Secondary | ICD-10-CM | POA: Diagnosis not present

## 2016-09-20 DIAGNOSIS — N2581 Secondary hyperparathyroidism of renal origin: Secondary | ICD-10-CM | POA: Diagnosis not present

## 2016-09-20 DIAGNOSIS — N186 End stage renal disease: Secondary | ICD-10-CM | POA: Diagnosis not present

## 2016-09-21 DIAGNOSIS — Z992 Dependence on renal dialysis: Secondary | ICD-10-CM | POA: Diagnosis not present

## 2016-09-21 DIAGNOSIS — T8610 Unspecified complication of kidney transplant: Secondary | ICD-10-CM | POA: Diagnosis not present

## 2016-09-21 DIAGNOSIS — N186 End stage renal disease: Secondary | ICD-10-CM | POA: Diagnosis not present

## 2016-09-23 DIAGNOSIS — N186 End stage renal disease: Secondary | ICD-10-CM | POA: Diagnosis not present

## 2016-09-23 DIAGNOSIS — N2581 Secondary hyperparathyroidism of renal origin: Secondary | ICD-10-CM | POA: Diagnosis not present

## 2016-09-23 DIAGNOSIS — D509 Iron deficiency anemia, unspecified: Secondary | ICD-10-CM | POA: Diagnosis not present

## 2016-09-23 DIAGNOSIS — D631 Anemia in chronic kidney disease: Secondary | ICD-10-CM | POA: Diagnosis not present

## 2016-09-25 DIAGNOSIS — D509 Iron deficiency anemia, unspecified: Secondary | ICD-10-CM | POA: Diagnosis not present

## 2016-09-25 DIAGNOSIS — D631 Anemia in chronic kidney disease: Secondary | ICD-10-CM | POA: Diagnosis not present

## 2016-09-25 DIAGNOSIS — N186 End stage renal disease: Secondary | ICD-10-CM | POA: Diagnosis not present

## 2016-09-25 DIAGNOSIS — N2581 Secondary hyperparathyroidism of renal origin: Secondary | ICD-10-CM | POA: Diagnosis not present

## 2016-09-27 DIAGNOSIS — N2581 Secondary hyperparathyroidism of renal origin: Secondary | ICD-10-CM | POA: Diagnosis not present

## 2016-09-27 DIAGNOSIS — D509 Iron deficiency anemia, unspecified: Secondary | ICD-10-CM | POA: Diagnosis not present

## 2016-09-27 DIAGNOSIS — N186 End stage renal disease: Secondary | ICD-10-CM | POA: Diagnosis not present

## 2016-09-27 DIAGNOSIS — D631 Anemia in chronic kidney disease: Secondary | ICD-10-CM | POA: Diagnosis not present

## 2016-09-30 DIAGNOSIS — N186 End stage renal disease: Secondary | ICD-10-CM | POA: Diagnosis not present

## 2016-09-30 DIAGNOSIS — D631 Anemia in chronic kidney disease: Secondary | ICD-10-CM | POA: Diagnosis not present

## 2016-09-30 DIAGNOSIS — N2581 Secondary hyperparathyroidism of renal origin: Secondary | ICD-10-CM | POA: Diagnosis not present

## 2016-09-30 DIAGNOSIS — D509 Iron deficiency anemia, unspecified: Secondary | ICD-10-CM | POA: Diagnosis not present

## 2016-10-02 DIAGNOSIS — N186 End stage renal disease: Secondary | ICD-10-CM | POA: Diagnosis not present

## 2016-10-02 DIAGNOSIS — N2581 Secondary hyperparathyroidism of renal origin: Secondary | ICD-10-CM | POA: Diagnosis not present

## 2016-10-02 DIAGNOSIS — D509 Iron deficiency anemia, unspecified: Secondary | ICD-10-CM | POA: Diagnosis not present

## 2016-10-02 DIAGNOSIS — D631 Anemia in chronic kidney disease: Secondary | ICD-10-CM | POA: Diagnosis not present

## 2016-10-04 DIAGNOSIS — N186 End stage renal disease: Secondary | ICD-10-CM | POA: Diagnosis not present

## 2016-10-04 DIAGNOSIS — D509 Iron deficiency anemia, unspecified: Secondary | ICD-10-CM | POA: Diagnosis not present

## 2016-10-04 DIAGNOSIS — N2581 Secondary hyperparathyroidism of renal origin: Secondary | ICD-10-CM | POA: Diagnosis not present

## 2016-10-04 DIAGNOSIS — D631 Anemia in chronic kidney disease: Secondary | ICD-10-CM | POA: Diagnosis not present

## 2016-10-07 DIAGNOSIS — N186 End stage renal disease: Secondary | ICD-10-CM | POA: Diagnosis not present

## 2016-10-07 DIAGNOSIS — D631 Anemia in chronic kidney disease: Secondary | ICD-10-CM | POA: Diagnosis not present

## 2016-10-07 DIAGNOSIS — D509 Iron deficiency anemia, unspecified: Secondary | ICD-10-CM | POA: Diagnosis not present

## 2016-10-07 DIAGNOSIS — N2581 Secondary hyperparathyroidism of renal origin: Secondary | ICD-10-CM | POA: Diagnosis not present

## 2016-10-09 DIAGNOSIS — D509 Iron deficiency anemia, unspecified: Secondary | ICD-10-CM | POA: Diagnosis not present

## 2016-10-09 DIAGNOSIS — N186 End stage renal disease: Secondary | ICD-10-CM | POA: Diagnosis not present

## 2016-10-09 DIAGNOSIS — N2581 Secondary hyperparathyroidism of renal origin: Secondary | ICD-10-CM | POA: Diagnosis not present

## 2016-10-09 DIAGNOSIS — D631 Anemia in chronic kidney disease: Secondary | ICD-10-CM | POA: Diagnosis not present

## 2016-10-11 DIAGNOSIS — N186 End stage renal disease: Secondary | ICD-10-CM | POA: Diagnosis not present

## 2016-10-11 DIAGNOSIS — D509 Iron deficiency anemia, unspecified: Secondary | ICD-10-CM | POA: Diagnosis not present

## 2016-10-11 DIAGNOSIS — D631 Anemia in chronic kidney disease: Secondary | ICD-10-CM | POA: Diagnosis not present

## 2016-10-11 DIAGNOSIS — N2581 Secondary hyperparathyroidism of renal origin: Secondary | ICD-10-CM | POA: Diagnosis not present

## 2016-10-13 ENCOUNTER — Encounter (HOSPITAL_COMMUNITY): Payer: Self-pay | Admitting: Emergency Medicine

## 2016-10-13 ENCOUNTER — Emergency Department (HOSPITAL_COMMUNITY)
Admission: EM | Admit: 2016-10-13 | Discharge: 2016-10-14 | Disposition: A | Discharge status: Home / Self Care | Payer: Medicare Other | Attending: Emergency Medicine | Admitting: Emergency Medicine

## 2016-10-13 DIAGNOSIS — I120 Hypertensive chronic kidney disease with stage 5 chronic kidney disease or end stage renal disease: Secondary | ICD-10-CM | POA: Diagnosis not present

## 2016-10-13 DIAGNOSIS — I10 Essential (primary) hypertension: Secondary | ICD-10-CM

## 2016-10-13 DIAGNOSIS — Z79899 Other long term (current) drug therapy: Secondary | ICD-10-CM | POA: Diagnosis not present

## 2016-10-13 DIAGNOSIS — N186 End stage renal disease: Secondary | ICD-10-CM | POA: Diagnosis not present

## 2016-10-13 DIAGNOSIS — Z992 Dependence on renal dialysis: Secondary | ICD-10-CM | POA: Insufficient documentation

## 2016-10-13 DIAGNOSIS — N9489 Other specified conditions associated with female genital organs and menstrual cycle: Secondary | ICD-10-CM | POA: Insufficient documentation

## 2016-10-13 DIAGNOSIS — R109 Unspecified abdominal pain: Secondary | ICD-10-CM | POA: Diagnosis present

## 2016-10-13 DIAGNOSIS — K259 Gastric ulcer, unspecified as acute or chronic, without hemorrhage or perforation: Secondary | ICD-10-CM

## 2016-10-13 DIAGNOSIS — Z940 Kidney transplant status: Secondary | ICD-10-CM | POA: Insufficient documentation

## 2016-10-13 LAB — COMPREHENSIVE METABOLIC PANEL
ALBUMIN: 4.4 g/dL (ref 3.5–5.0)
ALK PHOS: 73 U/L (ref 38–126)
ALT: 14 U/L (ref 14–54)
ANION GAP: 19 — AB (ref 5–15)
AST: 22 U/L (ref 15–41)
BUN: 36 mg/dL — ABNORMAL HIGH (ref 6–20)
CALCIUM: 10 mg/dL (ref 8.9–10.3)
CO2: 26 mmol/L (ref 22–32)
Chloride: 93 mmol/L — ABNORMAL LOW (ref 101–111)
Creatinine, Ser: 13.86 mg/dL — ABNORMAL HIGH (ref 0.44–1.00)
GFR calc Af Amer: 4 mL/min — ABNORMAL LOW (ref >60)
GFR calc non Af Amer: 3 mL/min — ABNORMAL LOW (ref >60)
GLUCOSE: 83 mg/dL (ref 65–99)
POTASSIUM: 4 mmol/L (ref 3.5–5.1)
SODIUM: 138 mmol/L (ref 135–145)
Total Bilirubin: 1 mg/dL (ref 0.3–1.2)
Total Protein: 8 g/dL (ref 6.5–8.1)

## 2016-10-13 LAB — CBC
HEMATOCRIT: 35.8 % — AB (ref 36.0–46.0)
HEMOGLOBIN: 12 g/dL (ref 12.0–15.0)
MCH: 30.4 pg (ref 26.0–34.0)
MCHC: 33.5 g/dL (ref 30.0–36.0)
MCV: 90.6 fL (ref 78.0–100.0)
Platelets: 170 10*3/uL (ref 150–400)
RBC: 3.95 MIL/uL (ref 3.87–5.11)
RDW: 17.5 % — ABNORMAL HIGH (ref 11.5–15.5)
WBC: 9.9 10*3/uL (ref 4.0–10.5)

## 2016-10-13 LAB — LIPASE, BLOOD: Lipase: 19 U/L (ref 11–51)

## 2016-10-13 MED ORDER — SUCRALFATE 1 GM/10ML PO SUSP
1.0000 g | Freq: Once | ORAL | Status: AC
  Administered 2016-10-13: 1 g via ORAL
  Filled 2016-10-13: qty 10

## 2016-10-13 MED ORDER — METOCLOPRAMIDE HCL 10 MG PO TABS
10.0000 mg | ORAL_TABLET | Freq: Once | ORAL | Status: AC
  Administered 2016-10-13: 10 mg via ORAL
  Filled 2016-10-13: qty 1

## 2016-10-13 MED ORDER — MORPHINE SULFATE (PF) 4 MG/ML IV SOLN
6.0000 mg | Freq: Once | INTRAVENOUS | Status: AC
  Administered 2016-10-13: 6 mg via INTRAVENOUS
  Filled 2016-10-13: qty 2

## 2016-10-13 MED ORDER — RANITIDINE HCL 150 MG/10ML PO SYRP
300.0000 mg | ORAL_SOLUTION | Freq: Once | ORAL | Status: AC
  Administered 2016-10-13: 300 mg via ORAL
  Filled 2016-10-13: qty 20

## 2016-10-13 NOTE — ED Notes (Signed)
ED Provider at bedside. 

## 2016-10-13 NOTE — ED Provider Notes (Signed)
[facility] DEPT Provider Note   CSN: [id number] Arrival date & time: 10/13/16  2224  By signing my name below, I, Higinio Plan, attest that this documentation has been prepared under the direction and in the presence of Everlene Balls, MD . Electronically Signed: Higinio Plan, Scribe. 10/13/2016. 11:14 PM.  History   Chief Complaint Chief Complaint  Patient presents with  . Abdominal Pain   The history is provided by the patient. No language interpreter was used.   HPI Comments: Kerry Cook is a 31 y.o. 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 Pt reports associated nausea and notes her pain is not exacerbated after eating. She states hx of peptic ulcers and notes her current pain feels similar to previous episodes. She reports she takes Protonix BID for her ulcers and states she took her medication today with no relief. Pt denies any vomiting, diarrhea, fever, or difficulty urinating.   Past Medical History:  Diagnosis Date  . Chronic kidney disease    previous hx dialysis  . Dialysis patient ([facility])   . History of kidney transplant 2012   kidney failure due to hypertension  . Hypertension   . Peptic ulcer     Patient Active Problem List   Diagnosis Date Noted  . Malignant hypertension 05/19/2016  . Acute gastritis without hemorrhage 05/19/2016  . Hyperkalemia 05/19/2016  . Hypertensive crisis 05/19/2016  . Peptic ulcer   . Abnormal CT scan, small bowel   . Multiple gastric ulcers   . Gastric outlet obstruction 03/31/2016  . Medicare annual wellness visit, initial 10/31/2015  . ESRD (end stage renal disease) on dialysis ([place])   . Cephalalgia   . History of renal transplant   . Headache 06/07/2015  . Hypertensive urgency 02/16/2015  . Absolute anemia   . SOB (shortness of breath) 02/01/2015  .  Hypertension   . History of kidney transplant   . Pneumonia 01/23/2015  . Tachycardia   . Symptomatic anemia 01/20/2015  . Supervision of high risk pregnancy, antepartum 06/02/2014  . Anemia in chronic kidney disease 10/01/2007  . THROMBOCYTOPENIA 10/01/2007  . End stage renal disease on dialysis ([place]) 10/01/2007  . PAP SMEAR, LGSIL, ABNORMAL 10/01/2007  . HYPERTENSION, BENIGN SYSTEMIC 08/21/2006  . PROTEINURIA 08/21/2006    Past Surgical History:  Procedure Laterality Date  . ESOPHAGOGASTRODUODENOSCOPY N/A 04/01/2016   Procedure: ESOPHAGOGASTRODUODENOSCOPY (EGD);  Surgeon: Gatha Mayer, MD;  [facility] END[facility]COPY;  Service: Endoscopy;  Laterality: N/A;  . KIDNEY TRANSPLANT Bilateral 2012    OB History    Gravida Para Term Preterm AB Living   1       1 0   SAB TAB Ectopic Multiple Live Births   1               Home Medications    Prior to Admission medications   Medication Sig Start Date End Date Taking? Authorizing Provider  amLODipine (NORVASC) 10 MG tablet Take 1 tablet (10 mg total) by mouth every evening. Patient taking differently: Take 10 mg by mouth daily.  09/13/15   [name], MD  calcium acetate (PHOSLO) 667 MG capsule Take 2,001 mg by mouth 3 (three) times daily with meals. 05/29/15   Historical Provider, MD  cloNIDine (CATAPRES) 0.1 MG tablet Take 0.1 mg by mouth 2 (two) times daily.    Historical Provider,  MD  [employer] ANTI-ITCH lotion Apply 1 application topically daily as needed for itching. 05/08/16   Historical Provider, MD  labetalol (NORMODYNE) 200 MG tablet Take 1 tablet (200 mg total) by mouth 2 (two) times daily. 04/02/16   Silver Huguenin Elgergawy, MD  losartan (COZAAR) 100 MG tablet Take 1 tablet (100 mg total) by mouth every evening. 05/21/16   Verlee Monte, MD  multivitamin (RENA-VIT) TABS tablet Take 1 tablet by mouth at bedtime. 06/09/15   Debbe Odea, MD  ondansetron (ZOFRAN ODT) 8 MG disintegrating tablet Take 1 tablet (8 mg total) by mouth every  8 (eight) hours as needed for nausea or vomiting. 03/24/16   Jola Schmidt, MD  pantoprazole (PROTONIX) 40 MG tablet Take 40 mg by mouth daily.    Historical Provider, MD    Family History Family History  Problem Relation Age of Onset  . Kidney disease Father   . Lupus Maternal Grandmother   . Cancer Maternal Grandfather   . Colon cancer Neg Hx   . Stomach cancer Neg Hx   . Rectal cancer Neg Hx   . Esophageal cancer Neg Hx   . Liver cancer Neg Hx     Social History Social History  Substance Use Topics  . Smoking status: Never Smoker  . Smokeless tobacco: Never Used  . Alcohol use No     Allergies   Patient has no known allergies.   Review of Systems Review of Systems  Constitutional: Negative for fever.  Gastrointestinal: Positive for abdominal pain and nausea. Negative for diarrhea and vomiting.  Genitourinary: Negative for difficulty urinating.  All other systems reviewed and are negative.  Physical Exam Updated Vital Signs BP (!) 222/130 (BP Location: Right Arm)   Pulse 96   Temp 98.1 F (36.7 C) (Oral)   Resp 18   Ht 5' (1.524 m)   Wt 125 lb (56.7 kg)   LMP 10/07/2016   SpO2 100%   BMI 24.41 kg/m   Physical Exam  Constitutional: She is oriented to person, place, and time. She appears well-developed and well-nourished. No distress.  HENT:  Head: Normocephalic and atraumatic.  Nose: Nose normal.  Mouth/Throat: Oropharynx is clear and moist. No oropharyngeal exudate.  Eyes: Conjunctivae and EOM are normal. Pupils are equal, round, and reactive to light. No scleral icterus.  Neck: Normal range of motion. Neck supple. No JVD present. No tracheal deviation present. No thyromegaly present.  Cardiovascular: Normal rate, regular rhythm and normal heart sounds.  Exam reveals no gallop and no friction rub.   No murmur heard. Pulmonary/Chest: Effort normal and breath sounds normal. No respiratory distress. She has no wheezes. She exhibits no tenderness.  Abdominal:  Soft. Bowel sounds are normal. She exhibits no distension and no mass. There is tenderness. There is no rebound and no guarding.  Diffuse abdominal tenderness that is worse in the epigastric region.   Musculoskeletal: Normal range of motion. She exhibits no edema or tenderness.  LUE AVF with palpable thrill.   Lymphadenopathy:    She has no cervical adenopathy.  Neurological: She is alert and oriented to person, place, and time. No cranial nerve deficit. She exhibits normal muscle tone.  Skin: Skin is warm and dry. No rash noted. No erythema. No pallor.  Nursing note and vitals reviewed.  ED Treatments / Results  DIAGNOSTIC STUDIES:  Oxygen Saturation is 100% on RA, normal by my interpretation.    COORDINATION OF CARE:  11:12 PM Discussed treatment plan with pt at bedside and  pt agreed to plan.  Labs (all labs ordered are listed, but only abnormal results are displayed) Labs Reviewed  LIPASE, BLOOD  COMPREHENSIVE METABOLIC PANEL  CBC  URINALYSIS, ROUTINE W REFLEX MICROSCOPIC  HCG, QUANTITATIVE, PREGNANCY    EKG  EKG Interpretation None       Radiology No results found.  Procedures Procedures (including critical care time)  Medications Ordered in ED Medications - No data to display  Initial Impression / Assessment and Plan / ED Course  I have reviewed the triage vital signs and the nursing notes.  Pertinent labs & imaging results that were available during my care of the patient were reviewed by me and considered in my medical decision making (see chart for details).    Patient presents to the ED for abdominal pain. States this is likely her stomach ulcer acting up as It feels similar. She has been compliant with protonix.  She was given ranitidine, sucralfate, and reglan in the ED.  Her BP is very high, 230s/130s.  She has come in with this blood pressure several times before and states that she seems to always run this high. She did not take her night time meds.   Will give her home meds.   12:14 AM Upon repeat evaluation, patient states she feels much better.  Requesting nighttime BP meds to take which was ordered for her.  Will observe in the ED after administration and prior to DC.  I personally performed the services described in this documentation, which was scribed in my presence. The recorded information has been reviewed and is accurate.     Final Clinical Impressions(s) / ED Diagnoses   Final diagnoses:  None    New Prescriptions New Prescriptions   No medications on file     Everlene Balls, MD 10/14/16 0102

## 2016-10-13 NOTE — ED Triage Notes (Signed)
Pt c/o recurrent midline abd pain onset today with nausea, denies diarrhea.  Pt is HD patient, M,W,F. Full tx Friday.

## 2016-10-14 DIAGNOSIS — N2581 Secondary hyperparathyroidism of renal origin: Secondary | ICD-10-CM | POA: Diagnosis not present

## 2016-10-14 DIAGNOSIS — D509 Iron deficiency anemia, unspecified: Secondary | ICD-10-CM | POA: Diagnosis not present

## 2016-10-14 DIAGNOSIS — N186 End stage renal disease: Secondary | ICD-10-CM | POA: Diagnosis not present

## 2016-10-14 DIAGNOSIS — D631 Anemia in chronic kidney disease: Secondary | ICD-10-CM | POA: Diagnosis not present

## 2016-10-14 LAB — HCG, QUANTITATIVE, PREGNANCY: hCG, Beta Chain, Quant, S: 3 m[IU]/mL (ref <5)

## 2016-10-14 MED ORDER — CLONIDINE HCL 0.1 MG PO TABS
0.1000 mg | ORAL_TABLET | Freq: Once | ORAL | Status: AC
  Administered 2016-10-14: 0.1 mg via ORAL
  Filled 2016-10-14: qty 1

## 2016-10-14 MED ORDER — LOSARTAN POTASSIUM 50 MG PO TABS
100.0000 mg | ORAL_TABLET | Freq: Once | ORAL | Status: AC
  Administered 2016-10-14: 100 mg via ORAL
  Filled 2016-10-14: qty 2

## 2016-10-14 MED ORDER — AMLODIPINE BESYLATE 5 MG PO TABS
10.0000 mg | ORAL_TABLET | Freq: Once | ORAL | Status: AC
  Administered 2016-10-14: 10 mg via ORAL
  Filled 2016-10-14: qty 2

## 2016-10-14 MED ORDER — LABETALOL HCL 200 MG PO TABS
200.0000 mg | ORAL_TABLET | Freq: Once | ORAL | Status: AC
  Administered 2016-10-14: 200 mg via ORAL
  Filled 2016-10-14: qty 1

## 2016-10-14 MED ORDER — DIPHENHYDRAMINE HCL 50 MG/ML IJ SOLN
25.0000 mg | Freq: Once | INTRAMUSCULAR | Status: AC
  Administered 2016-10-14: 25 mg via INTRAVENOUS
  Filled 2016-10-14: qty 1

## 2016-10-14 NOTE — ED Notes (Signed)
Pt departed in NAD, refused use of wheelchair.  

## 2016-10-16 DIAGNOSIS — N2581 Secondary hyperparathyroidism of renal origin: Secondary | ICD-10-CM | POA: Diagnosis not present

## 2016-10-16 DIAGNOSIS — D631 Anemia in chronic kidney disease: Secondary | ICD-10-CM | POA: Diagnosis not present

## 2016-10-16 DIAGNOSIS — N186 End stage renal disease: Secondary | ICD-10-CM | POA: Diagnosis not present

## 2016-10-16 DIAGNOSIS — D509 Iron deficiency anemia, unspecified: Secondary | ICD-10-CM | POA: Diagnosis not present

## 2016-10-17 ENCOUNTER — Encounter: Payer: Self-pay | Admitting: Physician Assistant

## 2016-10-17 ENCOUNTER — Ambulatory Visit (INDEPENDENT_AMBULATORY_CARE_PROVIDER_SITE_OTHER): Payer: Medicare Other | Admitting: Physician Assistant

## 2016-10-17 VITALS — BP 156/92 | HR 84 | Ht 60.0 in | Wt 130.0 lb

## 2016-10-17 DIAGNOSIS — K279 Peptic ulcer, site unspecified, unspecified as acute or chronic, without hemorrhage or perforation: Secondary | ICD-10-CM | POA: Diagnosis not present

## 2016-10-17 DIAGNOSIS — R1013 Epigastric pain: Secondary | ICD-10-CM

## 2016-10-17 MED ORDER — RANITIDINE HCL 150 MG PO TABS: 150.0000 mg | ORAL_TABLET | Freq: Two times a day (BID) | ORAL | 5 refills | Status: DC

## 2016-10-17 MED ORDER — PANTOPRAZOLE SODIUM 40 MG PO TBEC: 40.0000 mg | DELAYED_RELEASE_TABLET | Freq: Two times a day (BID) | ORAL | 5 refills | Status: DC

## 2016-10-17 NOTE — Progress Notes (Addendum)
Chief Complaint: Abdominal Pain  HPI:  Kerry Cook is a 31 year old African-American female with a past medical history of chronic kidney disease on dialysis (Monday, Wednesday, Friday), peptic ulcers and others listed below who presents to clinic today with a complaint of epigastric abdominal pain.   Patient is regularly followed by Dr. Carlean Purl and had an EGD in 04/01/16 with finding of multiple gastric ulcers. At that time she is placed on Pantoprazole 40 mg twice a day. She followed with Dr. Carlean Purl in January and was doing well other than a recent hypertensive crisis for which she was seen in the ED. At that time she was told to decrease from twice daily dosing of Pantoprazole 40mg  down to once daily.   Patient was recently seen in the ER on 10/13/16 for mid epigastric abdominal pain and nausea. Labs done at that time included a normal CBC, CMP and lipase. She was noted to have a hypertensive crisis as well with a BP in the 230s/130s. She felt better after IV pain meds and was discharged.   Today, the patient tells me that after decreasing her medicine in January she started to have increased epigastric abdominal pain at the end of February. She had this medicine increased by her nephrologist back to twice daily dosing which did seem to help with the pain. Patient tells me she has continued with some epigastric discomfort over the past month, typically related to what types of food she is eating. She recently was seen in the ER for this abdominal pain which resolved with IV pain medicines there. Since then she has been okay with no abdominal pain at all continuing on Protonix 40 mg twice a day. Patient notes that as long as she watches her diet she seems to do okay.   Patient denies fever, chills, blood in her stool, melena, weight loss, anorexia, change in bowel habits, heartburn, reflux, nausea or vomiting.  Past Medical History:  Diagnosis Date  . Chronic kidney disease    previous hx dialysis   . Dialysis patient ([place])   . History of kidney transplant 2012   kidney failure due to hypertension  . Hypertension   . Peptic ulcer     Past Surgical History:  Procedure Laterality Date  . ESOPHAGOGASTRODUODENOSCOPY N/A 04/01/2016   Procedure: ESOPHAGOGASTRODUODENOSCOPY (EGD);  Surgeon: Gatha Mayer, MD;  Location: [facility] ENDOSCOPY;  Service: Endoscopy;  Laterality: N/A;  . KIDNEY TRANSPLANT Bilateral 2012    Current Outpatient Prescriptions  Medication Sig Dispense Refill  . amLODipine (NORVASC) 10 MG tablet Take 1 tablet (10 mg total) by mouth every evening. (Patient taking differently: Take 10 mg by mouth daily. ) 30 tablet 0  . calcium acetate (PHOSLO) 667 MG capsule Take 2,001 mg by mouth 3 (three) times daily with meals.  3  . cloNIDine (CATAPRES) 0.1 MG tablet Take 0.1 mg by mouth 2 (two) times daily.    Marland Kitchen labetalol (NORMODYNE) 200 MG tablet Take 1 tablet (200 mg total) by mouth 2 (two) times daily. 60 tablet 1  . losartan (COZAAR) 100 MG tablet Take 1 tablet (100 mg total) by mouth every evening. 30 tablet 0  . multivitamin (RENA-VIT) TABS tablet Take 1 tablet by mouth at bedtime. 30 each 0  . ondansetron (ZOFRAN ODT) 8 MG disintegrating tablet Take 1 tablet (8 mg total) by mouth every 8 (eight) hours as needed for nausea or vomiting. 10 tablet 0  . pantoprazole (PROTONIX) 40 MG tablet Take 1 tablet (40 mg total)  by mouth 2 (two) times daily. 60 tablet 5  . ranitidine (ZANTAC) 150 MG tablet Take 1 tablet (150 mg total) by mouth 2 (two) times daily. 60 tablet 5   No current facility-administered medications for this visit.     Allergies as of 10/17/2016  . (No Known Allergies)    Family History  Problem Relation Age of Onset  . Kidney disease Father   . Lupus Maternal Grandmother   . Cancer Maternal Grandfather   . Colon cancer Neg Hx   . Stomach cancer Neg Hx   . Rectal cancer Neg Hx   . Esophageal cancer Neg Hx   . Liver cancer Neg Hx     Social History    Social History  . Marital status: Single    Spouse name: N/A  . Number of children: 0  . Years of education: N/A   Occupational History  . Daycare    Social History Main Topics  . Smoking status: Never Smoker  . Smokeless tobacco: Never Used  . Alcohol use No  . Drug use: No  . Sexual activity: Yes    Partners: Male    Birth control/ protection: Condom, None   Other Topics Concern  . Not on file   Social History Narrative  . No narrative on file    Review of Systems:    Constitutional: No weight loss, fever or chills Cardiovascular: No chest pain Respiratory: No SOB  Gastrointestinal: See HPI and otherwise negative   Physical Exam:  Vital signs: BP (!) 156/92   Pulse 84   Ht 5' (1.524 m)   Wt 130 lb (59 kg)   LMP 10/07/2016   BMI 25.39 kg/m   Constitutional:   Pleasant African American female appears to be in NAD, Well developed, Well nourished, alert and cooperative Respiratory: Respirations even and unlabored. Lungs clear to auscultation bilaterally.   No wheezes, crackles, or rhonchi.  Cardiovascular: Normal S1, S2. No MRG. Regular rate and rhythm. No peripheral edema, cyanosis or pallor.  Gastrointestinal:  Soft, nondistended, mild epigastric ttp No rebound or guarding. Normal bowel sounds. No appreciable masses or hepatomegaly. Psychiatric: Demonstrates good judgement and reason without abnormal affect or behaviors.  MOST RECENT LABS AND IMAGING: CBC    Component Value Date/Time   WBC 9.9 10/13/2016 2246   RBC 3.95 10/13/2016 2246   HGB 12.0 10/13/2016 2246   HCT 35.8 (L) 10/13/2016 2246   PLT 170 10/13/2016 2246   MCV 90.6 10/13/2016 2246   MCH 30.4 10/13/2016 2246   MCHC 33.5 10/13/2016 2246   RDW 17.5 (H) 10/13/2016 2246   LYMPHSABS 1.0 05/19/2016 1055   MONOABS 0.7 05/19/2016 1055   EOSABS 0.2 05/19/2016 1055   BASOSABS 0.0 05/19/2016 1055    CMP     Component Value Date/Time   NA 138 10/13/2016 2246   K 4.0 10/13/2016 2246   CL 93  (L) 10/13/2016 2246   CO2 26 10/13/2016 2246   GLUCOSE 83 10/13/2016 2246   BUN 36 (H) 10/13/2016 2246   CREATININE 13.86 (H) 10/13/2016 2246   CALCIUM 10.0 10/13/2016 2246   CALCIUM 8.3 (L) 08/27/2007 2000   PROT 8.0 10/13/2016 2246   ALBUMIN 4.4 10/13/2016 2246   AST 22 10/13/2016 2246   ALT 14 10/13/2016 2246   ALKPHOS 73 10/13/2016 2246   BILITOT 1.0 10/13/2016 2246   GFRNONAA 3 (L) 10/13/2016 2246   GFRAA 4 (L) 10/13/2016 2246    Assessment: 1. Epigastric pain: Likely related to  gastritis, see below as well 2. History of PUD: Seen on EGD in October 2017, patient was doing well at time of follow-up in January, decreased to once daily dosing of Pantoprazole,, since then has had increased epigastric pain, specifically with "junk food"  Plan: 1. Patient was told to continue her Pantoprazole 40 mg twice a day. Refilled this medication for her #60 with 5 refills 2. Patient was told to add Zantac 150 mg daily at bedtime. Prescribed #30 with 5 refills 3. We reviewed an antireflux diet and lifestyle modifications. She was provided with a handout. 4. Patient to return to clinic in 3-4 months with Dr. Carlean Purl or sooner if necessary. If she continues with this upper abdominal pain irregardless of Pantoprazole, could consider repeat EGD  Ellouise Newer, PA-C [facility] 10/17/2016, 12:49 PM  Cc: [name], FNP  Agree with Ms. Selby Foisy's evaluation and management. Gatha Mayer, MD, [facility]

## 2016-10-17 NOTE — Patient Instructions (Signed)
We have sent the following medications to your pharmacy for you to pick up at your convenience:  Pantoprazole 40 mg twice a day.   Ranitidine 150 mg at bedtime  We have given you a anti-reflux handout.

## 2016-10-18 DIAGNOSIS — N186 End stage renal disease: Secondary | ICD-10-CM | POA: Diagnosis not present

## 2016-10-18 DIAGNOSIS — D509 Iron deficiency anemia, unspecified: Secondary | ICD-10-CM | POA: Diagnosis not present

## 2016-10-18 DIAGNOSIS — D631 Anemia in chronic kidney disease: Secondary | ICD-10-CM | POA: Diagnosis not present

## 2016-10-18 DIAGNOSIS — N2581 Secondary hyperparathyroidism of renal origin: Secondary | ICD-10-CM | POA: Diagnosis not present

## 2016-10-21 DIAGNOSIS — N2581 Secondary hyperparathyroidism of renal origin: Secondary | ICD-10-CM | POA: Diagnosis not present

## 2016-10-21 DIAGNOSIS — Z992 Dependence on renal dialysis: Secondary | ICD-10-CM | POA: Diagnosis not present

## 2016-10-21 DIAGNOSIS — D631 Anemia in chronic kidney disease: Secondary | ICD-10-CM | POA: Diagnosis not present

## 2016-10-21 DIAGNOSIS — D509 Iron deficiency anemia, unspecified: Secondary | ICD-10-CM | POA: Diagnosis not present

## 2016-10-21 DIAGNOSIS — N186 End stage renal disease: Secondary | ICD-10-CM | POA: Diagnosis not present

## 2016-10-21 DIAGNOSIS — T8610 Unspecified complication of kidney transplant: Secondary | ICD-10-CM | POA: Diagnosis not present

## 2016-10-23 DIAGNOSIS — N186 End stage renal disease: Secondary | ICD-10-CM | POA: Diagnosis not present

## 2016-10-23 DIAGNOSIS — D631 Anemia in chronic kidney disease: Secondary | ICD-10-CM | POA: Diagnosis not present

## 2016-10-23 DIAGNOSIS — D509 Iron deficiency anemia, unspecified: Secondary | ICD-10-CM | POA: Diagnosis not present

## 2016-10-23 DIAGNOSIS — N2581 Secondary hyperparathyroidism of renal origin: Secondary | ICD-10-CM | POA: Diagnosis not present

## 2016-10-23 DIAGNOSIS — E8770 Fluid overload, unspecified: Secondary | ICD-10-CM | POA: Diagnosis not present

## 2016-10-25 DIAGNOSIS — N2581 Secondary hyperparathyroidism of renal origin: Secondary | ICD-10-CM | POA: Diagnosis not present

## 2016-10-25 DIAGNOSIS — D631 Anemia in chronic kidney disease: Secondary | ICD-10-CM | POA: Diagnosis not present

## 2016-10-25 DIAGNOSIS — D509 Iron deficiency anemia, unspecified: Secondary | ICD-10-CM | POA: Diagnosis not present

## 2016-10-25 DIAGNOSIS — N186 End stage renal disease: Secondary | ICD-10-CM | POA: Diagnosis not present

## 2016-10-25 DIAGNOSIS — E8770 Fluid overload, unspecified: Secondary | ICD-10-CM | POA: Diagnosis not present

## 2016-10-28 DIAGNOSIS — D509 Iron deficiency anemia, unspecified: Secondary | ICD-10-CM | POA: Diagnosis not present

## 2016-10-28 DIAGNOSIS — N2581 Secondary hyperparathyroidism of renal origin: Secondary | ICD-10-CM | POA: Diagnosis not present

## 2016-10-28 DIAGNOSIS — N186 End stage renal disease: Secondary | ICD-10-CM | POA: Diagnosis not present

## 2016-10-28 DIAGNOSIS — D631 Anemia in chronic kidney disease: Secondary | ICD-10-CM | POA: Diagnosis not present

## 2016-10-28 DIAGNOSIS — E8770 Fluid overload, unspecified: Secondary | ICD-10-CM | POA: Diagnosis not present

## 2016-10-30 DIAGNOSIS — N186 End stage renal disease: Secondary | ICD-10-CM | POA: Diagnosis not present

## 2016-10-30 DIAGNOSIS — D631 Anemia in chronic kidney disease: Secondary | ICD-10-CM | POA: Diagnosis not present

## 2016-10-30 DIAGNOSIS — N2581 Secondary hyperparathyroidism of renal origin: Secondary | ICD-10-CM | POA: Diagnosis not present

## 2016-10-30 DIAGNOSIS — D509 Iron deficiency anemia, unspecified: Secondary | ICD-10-CM | POA: Diagnosis not present

## 2016-10-30 DIAGNOSIS — E8770 Fluid overload, unspecified: Secondary | ICD-10-CM | POA: Diagnosis not present

## 2016-11-01 DIAGNOSIS — N2581 Secondary hyperparathyroidism of renal origin: Secondary | ICD-10-CM | POA: Diagnosis not present

## 2016-11-01 DIAGNOSIS — E8770 Fluid overload, unspecified: Secondary | ICD-10-CM | POA: Diagnosis not present

## 2016-11-01 DIAGNOSIS — D509 Iron deficiency anemia, unspecified: Secondary | ICD-10-CM | POA: Diagnosis not present

## 2016-11-01 DIAGNOSIS — D631 Anemia in chronic kidney disease: Secondary | ICD-10-CM | POA: Diagnosis not present

## 2016-11-01 DIAGNOSIS — N186 End stage renal disease: Secondary | ICD-10-CM | POA: Diagnosis not present

## 2016-11-04 DIAGNOSIS — D631 Anemia in chronic kidney disease: Secondary | ICD-10-CM | POA: Diagnosis not present

## 2016-11-04 DIAGNOSIS — N2581 Secondary hyperparathyroidism of renal origin: Secondary | ICD-10-CM | POA: Diagnosis not present

## 2016-11-04 DIAGNOSIS — N186 End stage renal disease: Secondary | ICD-10-CM | POA: Diagnosis not present

## 2016-11-04 DIAGNOSIS — D509 Iron deficiency anemia, unspecified: Secondary | ICD-10-CM | POA: Diagnosis not present

## 2016-11-04 DIAGNOSIS — E8770 Fluid overload, unspecified: Secondary | ICD-10-CM | POA: Diagnosis not present

## 2016-11-06 DIAGNOSIS — D631 Anemia in chronic kidney disease: Secondary | ICD-10-CM | POA: Diagnosis not present

## 2016-11-06 DIAGNOSIS — N186 End stage renal disease: Secondary | ICD-10-CM | POA: Diagnosis not present

## 2016-11-06 DIAGNOSIS — D509 Iron deficiency anemia, unspecified: Secondary | ICD-10-CM | POA: Diagnosis not present

## 2016-11-06 DIAGNOSIS — E8770 Fluid overload, unspecified: Secondary | ICD-10-CM | POA: Diagnosis not present

## 2016-11-06 DIAGNOSIS — N2581 Secondary hyperparathyroidism of renal origin: Secondary | ICD-10-CM | POA: Diagnosis not present

## 2016-11-08 ENCOUNTER — Encounter (HOSPITAL_COMMUNITY): Payer: Self-pay

## 2016-11-08 ENCOUNTER — Emergency Department (HOSPITAL_COMMUNITY): Payer: Medicare Other

## 2016-11-08 ENCOUNTER — Emergency Department (HOSPITAL_COMMUNITY)
Admission: EM | Admit: 2016-11-08 | Discharge: 2016-11-08 | Disposition: A | Discharge status: Home / Self Care | Payer: Medicare Other | Attending: Emergency Medicine | Admitting: Emergency Medicine

## 2016-11-08 DIAGNOSIS — Z940 Kidney transplant status: Secondary | ICD-10-CM | POA: Diagnosis not present

## 2016-11-08 DIAGNOSIS — N186 End stage renal disease: Secondary | ICD-10-CM | POA: Diagnosis not present

## 2016-11-08 DIAGNOSIS — D631 Anemia in chronic kidney disease: Secondary | ICD-10-CM | POA: Diagnosis not present

## 2016-11-08 DIAGNOSIS — D509 Iron deficiency anemia, unspecified: Secondary | ICD-10-CM | POA: Diagnosis not present

## 2016-11-08 DIAGNOSIS — R1010 Upper abdominal pain, unspecified: Secondary | ICD-10-CM

## 2016-11-08 DIAGNOSIS — K279 Peptic ulcer, site unspecified, unspecified as acute or chronic, without hemorrhage or perforation: Secondary | ICD-10-CM | POA: Insufficient documentation

## 2016-11-08 DIAGNOSIS — Z992 Dependence on renal dialysis: Secondary | ICD-10-CM | POA: Insufficient documentation

## 2016-11-08 DIAGNOSIS — R1013 Epigastric pain: Secondary | ICD-10-CM | POA: Diagnosis not present

## 2016-11-08 DIAGNOSIS — I120 Hypertensive chronic kidney disease with stage 5 chronic kidney disease or end stage renal disease: Secondary | ICD-10-CM | POA: Diagnosis not present

## 2016-11-08 DIAGNOSIS — I10 Essential (primary) hypertension: Secondary | ICD-10-CM | POA: Diagnosis not present

## 2016-11-08 DIAGNOSIS — Z79899 Other long term (current) drug therapy: Secondary | ICD-10-CM | POA: Insufficient documentation

## 2016-11-08 DIAGNOSIS — E8770 Fluid overload, unspecified: Secondary | ICD-10-CM | POA: Diagnosis not present

## 2016-11-08 DIAGNOSIS — N2581 Secondary hyperparathyroidism of renal origin: Secondary | ICD-10-CM | POA: Diagnosis not present

## 2016-11-08 DIAGNOSIS — R109 Unspecified abdominal pain: Secondary | ICD-10-CM | POA: Diagnosis not present

## 2016-11-08 LAB — I-STAT BETA HCG BLOOD, ED (MC, WL, AP ONLY): I-stat hCG, quantitative: 11.2 m[IU]/mL — ABNORMAL HIGH (ref <5)

## 2016-11-08 LAB — I-STAT CHEM 8, ED
BUN: 40 mg/dL — AB (ref 6–20)
CALCIUM ION: 0.91 mmol/L — AB (ref 1.15–1.40)
CREATININE: 10.7 mg/dL — AB (ref 0.44–1.00)
Chloride: 100 mmol/L — ABNORMAL LOW (ref 101–111)
GLUCOSE: 85 mg/dL (ref 65–99)
HCT: 31 % — ABNORMAL LOW (ref 36.0–46.0)
Hemoglobin: 10.5 g/dL — ABNORMAL LOW (ref 12.0–15.0)
POTASSIUM: 4.3 mmol/L (ref 3.5–5.1)
Sodium: 135 mmol/L (ref 135–145)
TCO2: 22 mmol/L (ref 0–100)

## 2016-11-08 MED ORDER — CLONIDINE HCL 0.1 MG PO TABS
0.1000 mg | ORAL_TABLET | Freq: Once | ORAL | Status: AC
  Administered 2016-11-08: 0.1 mg via ORAL
  Filled 2016-11-08: qty 1

## 2016-11-08 MED ORDER — SODIUM CHLORIDE 0.9 % IV BOLUS (SEPSIS)
500.0000 mL | Freq: Once | INTRAVENOUS | Status: AC
  Administered 2016-11-08: 500 mL via INTRAVENOUS

## 2016-11-08 MED ORDER — PANTOPRAZOLE SODIUM 40 MG IV SOLR
40.0000 mg | Freq: Once | INTRAVENOUS | Status: AC
  Administered 2016-11-08: 40 mg via INTRAVENOUS
  Filled 2016-11-08: qty 40

## 2016-11-08 MED ORDER — MORPHINE SULFATE (PF) 4 MG/ML IV SOLN
4.0000 mg | Freq: Once | INTRAVENOUS | Status: AC
  Administered 2016-11-08: 4 mg via INTRAVENOUS
  Filled 2016-11-08: qty 1

## 2016-11-08 MED ORDER — ONDANSETRON HCL 4 MG/2ML IJ SOLN
4.0000 mg | Freq: Once | INTRAMUSCULAR | Status: AC
  Administered 2016-11-08: 4 mg via INTRAVENOUS
  Filled 2016-11-08: qty 2

## 2016-11-08 MED ORDER — LABETALOL HCL 5 MG/ML IV SOLN
20.0000 mg | Freq: Once | INTRAVENOUS | Status: AC
  Administered 2016-11-08: 20 mg via INTRAVENOUS
  Filled 2016-11-08: qty 4

## 2016-11-08 MED ORDER — DIPHENHYDRAMINE HCL 50 MG/ML IJ SOLN
25.0000 mg | Freq: Once | INTRAMUSCULAR | Status: AC
  Administered 2016-11-08: 25 mg via INTRAVENOUS
  Filled 2016-11-08: qty 1

## 2016-11-08 NOTE — ED Triage Notes (Signed)
Pt BIB by [facility] from dialysis c/o sever epigastric pain. PT received 30 minutes of dialysis today. Pt reports she did receive full treatment Monday and Wednesday this week. Pt reports hx of stomach ulcer stating she has been taking her Protonix. Pt nauseous but no vomiting.

## 2016-11-08 NOTE — ED Notes (Signed)
ED Provider at bedside. 

## 2016-11-08 NOTE — ED Notes (Signed)
ED Provider at bedside attempting blood draw r/t unsuccessful blood draw with RN, and IV team.

## 2016-11-08 NOTE — ED Notes (Signed)
This RN attempted IV X2 once in right AC, once in Right wrist.

## 2016-11-08 NOTE — ED Notes (Signed)
IV team at bedside 

## 2016-11-08 NOTE — ED Provider Notes (Addendum)
[facility] DEPT Provider Note   CSN: [id number] Arrival date & time:        History   Chief Complaint Chief Complaint  Patient presents with  . Abdominal Pain    HPI Kerry Cook is a 31 y.o. female hx of CKD on HD, HTN, peptic ulcer, here with abdominal pain, nausea. Patient states that she had epigastric pain started about midnight last night. She states that it's constant and she had trouble sleeping because of the pain. She states that it is associated with some nausea but no vomiting. Denies any radiation to the pain. Denies any diarrhea. Patient states that she has a history of stomach ulcer that was confirmed by endoscopy in October of last year. She states that she is still taking Protonix. She was at dialysis and only had 30 minutes of dialysis today and then came here for evaluation due to severe pain. Of note, patient has a history of hypertension and is always hypertensive whenever she comes to the ER. She did take her labetalol, clonidine, Norvasc this morning. Denies any fevers or chills or chest pain. Her last full dialysis was 2 days ago.  The history is provided by the patient.    Past Medical History:  Diagnosis Date  . Chronic kidney disease    previous hx dialysis  . Dialysis patient ([place])   . History of kidney transplant 2012   kidney failure due to hypertension  . Hypertension   . Peptic ulcer     Patient Active Problem List   Diagnosis Date Noted  . Malignant hypertension 05/19/2016  . Acute gastritis without hemorrhage 05/19/2016  . Hyperkalemia 05/19/2016  . Hypertensive crisis 05/19/2016  . Peptic ulcer   . Abnormal CT scan, small bowel   . Multiple gastric ulcers   . Gastric outlet obstruction 03/31/2016  . Medicare annual wellness visit, initial 10/31/2015  . ESRD (end stage renal disease) on dialysis ([place])   . Cephalalgia   . History of renal transplant   . Headache 06/07/2015  . Hypertensive urgency 02/16/2015  . Absolute anemia    . SOB (shortness of breath) 02/01/2015  . Hypertension   . History of kidney transplant   . Pneumonia 01/23/2015  . Tachycardia   . Symptomatic anemia 01/20/2015  . Supervision of high risk pregnancy, antepartum 06/02/2014  . Anemia in chronic kidney disease 10/01/2007  . THROMBOCYTOPENIA 10/01/2007  . End stage renal disease on dialysis ([place]) 10/01/2007  . PAP SMEAR, LGSIL, ABNORMAL 10/01/2007  . HYPERTENSION, BENIGN SYSTEMIC 08/21/2006  . PROTEINURIA 08/21/2006    Past Surgical History:  Procedure Laterality Date  . ESOPHAGOGASTRODUODENOSCOPY N/A 04/01/2016   Procedure: ESOPHAGOGASTRODUODENOSCOPY (EGD);  Surgeon: Gatha Mayer, MD;  Location: [facility] ENDOSCOPY;  Service: Endoscopy;  Laterality: N/A;  . KIDNEY TRANSPLANT Bilateral 2012    OB History    Gravida Para Term Preterm AB Living   1       1 0   SAB TAB Ectopic Multiple Live Births   1               Home Medications    Prior to Admission medications   Medication Sig Start Date End Date Taking? Authorizing Provider  amLODipine (NORVASC) 10 MG tablet Take 1 tablet (10 mg total) by mouth every evening. Patient taking differently: Take 10 mg by mouth daily.  09/13/15   [name], MD  calcium acetate (PHOSLO) 667 MG capsule Take 2,001 mg by mouth 3 (three) times daily with meals.  05/29/15   [provider]  cloNIDine (CATAPRES) 0.1 MG tablet Take 0.1 mg by mouth 2 (two) times daily.    [provider]  labetalol (NORMODYNE) 200 MG tablet Take 1 tablet (200 mg total) by mouth 2 (two) times daily. 04/02/16   Elgergawy, Silver Huguenin, MD  losartan (COZAAR) 100 MG tablet Take 1 tablet (100 mg total) by mouth every evening. 05/21/16   Verlee Monte, MD  multivitamin (RENA-VIT) TABS tablet Take 1 tablet by mouth at bedtime. 06/09/15   Debbe Odea, MD  ondansetron (ZOFRAN ODT) 8 MG disintegrating tablet Take 1 tablet (8 mg total) by mouth every 8 (eight) hours as needed for nausea or vomiting. 03/24/16    Jola Schmidt, MD  pantoprazole (PROTONIX) 40 MG tablet Take 1 tablet (40 mg total) by mouth 2 (two) times daily. 10/17/16   Levin Erp, PA  ranitidine (ZANTAC) 150 MG tablet Take 1 tablet (150 mg total) by mouth 2 (two) times daily. 10/17/16   Levin Erp, PA    Family History Family History  Problem Relation Age of Onset  . Kidney disease Father   . Lupus Maternal Grandmother   . Cancer Maternal Grandfather   . Colon cancer Neg Hx   . Stomach cancer Neg Hx   . Rectal cancer Neg Hx   . Esophageal cancer Neg Hx   . Liver cancer Neg Hx     Social History Social History  Substance Use Topics  . Smoking status: Never Smoker  . Smokeless tobacco: Never Used  . Alcohol use No     Allergies   Patient has no known allergies.   Review of Systems Review of Systems  Gastrointestinal: Positive for abdominal pain and nausea.  All other systems reviewed and are negative.    Physical Exam Updated Vital Signs BP (!) 200/139   Pulse 96   Temp 98.5 F (36.9 C) (Oral)   Resp 19   Ht 5' (1.524 m)   Wt 130 lb (59 kg)   LMP 10/26/2016   SpO2 100%   BMI 25.39 kg/m   Physical Exam  Constitutional: She is oriented to person, place, and time.  Uncomfortable, older than stated age   HENT:  Head: Normocephalic.  MM dry   Eyes: EOM are normal. Pupils are equal, round, and reactive to light.  Neck: Normal range of motion. Neck supple.  Cardiovascular: Normal rate, regular rhythm and normal heart sounds.   Pulmonary/Chest: Effort normal and breath sounds normal. No respiratory distress. She has no wheezes.  Abdominal: Soft. Bowel sounds are normal.  + epigastric tenderness   Musculoskeletal: Normal range of motion. She exhibits no edema.  Neurological: She is alert and oriented to person, place, and time. No cranial nerve deficit. Coordination normal.  Skin: Skin is warm.  Psychiatric: She has a normal mood and affect.  Nursing note and vitals  reviewed.    ED Treatments / Results  Labs (all labs ordered are listed, but only abnormal results are displayed) Labs Reviewed  I-STAT CHEM 8, ED - Abnormal; Notable for the following:       Result Value   Chloride 100 (*)    BUN 40 (*)    Creatinine, Ser 10.70 (*)    Calcium, Ion 0.91 (*)    Hemoglobin 10.5 (*)    HCT 31.0 (*)    All other components within normal limits  I-STAT BETA HCG BLOOD, ED (MC, [facility], AP ONLY) - Abnormal; Notable for the following:  I-stat hCG, quantitative 11.2 (*)    All other components within normal limits  CBC WITH DIFFERENTIAL/PLATELET    EKG  EKG Interpretation  Date/Time:  Friday Nov 08 2016 13:13:09 EDT Ventricular Rate:  92 PR Interval:    QRS Duration: 106 QT Interval:  382 QTC Calculation: 473 R Axis:   30 Text Interpretation:  Sinus rhythm LAE, consider biatrial enlargement LVH with secondary repolarization abnormality Baseline wander in lead(s) V4 No significant change since last tracing Confirmed by Arbor Cohen  MD, Zarea Diesing ([id number]) on 11/08/2016 1:20:24 PM       Radiology Dg Abd Acute W/chest  Result Date: 11/08/2016 CLINICAL DATA:  Epigastric pain EXAM: DG ABDOMEN ACUTE W/ 1V CHEST COMPARISON:  05/19/2016, 03/31/2016 FINDINGS: Single-view chest demonstrates mild cardiomegaly. No infiltrate or edema. Supine and upright views of the abdomen demonstrate no free air beneath the diaphragm. Overall nonobstructed gas pattern with decreased bowel gas and scattered fluid levels. A amorphous calcifications in the right lower quadrant are unchanged and correspond to calcified transplant. IMPRESSION: Cardiomegaly without edema or infiltrate. Nonobstructed gas pattern. Electronically Signed   By: Donavan Foil M.D.   On: 11/08/2016 16:28    Procedures Procedures (including critical care time)  Medications Ordered in ED Medications  sodium chloride 0.9 % bolus 500 mL (500 mLs Intravenous New Bag/Given 11/08/16 1418)  ondansetron (ZOFRAN) injection 4  mg (4 mg Intravenous Given 11/08/16 1418)  morphine 4 MG/ML injection 4 mg (4 mg Intravenous Given 11/08/16 1419)  labetalol (NORMODYNE,TRANDATE) injection 20 mg (20 mg Intravenous Given 11/08/16 1419)  pantoprazole (PROTONIX) injection 40 mg (40 mg Intravenous Given 11/08/16 1418)  diphenhydrAMINE (BENADRYL) injection 25 mg (25 mg Intravenous Given 11/08/16 1445)  morphine 4 MG/ML injection 4 mg (4 mg Intravenous Given 11/08/16 1550)  cloNIDine (CATAPRES) tablet 0.1 mg (0.1 mg Oral Given 11/08/16 1630)     Initial Impression / Assessment and Plan / ED Course  I have reviewed the triage vital signs and the nursing notes.  Pertinent labs & imaging results that were available during my care of the patient were reviewed by me and considered in my medical decision making (see chart for details).     ARTHELLA HEADINGS is a 31 y.o. female here with epigastric pain, nausea. Hx of gastric ulcer on protonix. Consider worsening ulcer vs gastritis, less likely perforated ulcer. Hypertensive in the ED but she is always hypertensive. Will get labs, lipase, acute abdominal series. Will give pain meds, zofran, IVF.   4:37 PM Patient is a difficult stick. IV team and nurse stuck her 5 times. I had to do US guided ABG to get labs. Unfortunately it clotted but I was able to get chem 8 and K 4.3. Acute abdominal series unremarkable. Unable to get LFTs and patient refused to get stuck again. Able to tolerate PO in the ED. I think likely gastritis. Given IVF, zofran, protonix, pain meds. Repeat abdominal exam soft and nontender. Of note, HCG was 11 but patient adamantly denies being pregnant. Will dc home.    Final Clinical Impressions(s) / ED Diagnoses   Final diagnoses:  None    New Prescriptions New Prescriptions   No medications on file     Drenda Freeze, MD 11/08/16 1637    Drenda Freeze, MD 11/08/16 1638    Drenda Freeze, MD 11/08/16 [id number]

## 2016-11-08 NOTE — Discharge Instructions (Signed)
Continue take protonix.   Get dialysis on Monday.   Continue taking your blood pressure meds.   See your doctor  Return to ER if you have worse abdominal pain, vomiting, fever, flank pain.

## 2016-11-09 DIAGNOSIS — D509 Iron deficiency anemia, unspecified: Secondary | ICD-10-CM | POA: Diagnosis not present

## 2016-11-09 DIAGNOSIS — N186 End stage renal disease: Secondary | ICD-10-CM | POA: Diagnosis not present

## 2016-11-09 DIAGNOSIS — N2581 Secondary hyperparathyroidism of renal origin: Secondary | ICD-10-CM | POA: Diagnosis not present

## 2016-11-09 DIAGNOSIS — E8770 Fluid overload, unspecified: Secondary | ICD-10-CM | POA: Diagnosis not present

## 2016-11-09 DIAGNOSIS — D631 Anemia in chronic kidney disease: Secondary | ICD-10-CM | POA: Diagnosis not present

## 2016-11-11 DIAGNOSIS — N2581 Secondary hyperparathyroidism of renal origin: Secondary | ICD-10-CM | POA: Diagnosis not present

## 2016-11-11 DIAGNOSIS — E8770 Fluid overload, unspecified: Secondary | ICD-10-CM | POA: Diagnosis not present

## 2016-11-11 DIAGNOSIS — D509 Iron deficiency anemia, unspecified: Secondary | ICD-10-CM | POA: Diagnosis not present

## 2016-11-11 DIAGNOSIS — N186 End stage renal disease: Secondary | ICD-10-CM | POA: Diagnosis not present

## 2016-11-11 DIAGNOSIS — D631 Anemia in chronic kidney disease: Secondary | ICD-10-CM | POA: Diagnosis not present

## 2016-11-13 DIAGNOSIS — E8770 Fluid overload, unspecified: Secondary | ICD-10-CM | POA: Diagnosis not present

## 2016-11-13 DIAGNOSIS — D631 Anemia in chronic kidney disease: Secondary | ICD-10-CM | POA: Diagnosis not present

## 2016-11-13 DIAGNOSIS — N2581 Secondary hyperparathyroidism of renal origin: Secondary | ICD-10-CM | POA: Diagnosis not present

## 2016-11-13 DIAGNOSIS — N186 End stage renal disease: Secondary | ICD-10-CM | POA: Diagnosis not present

## 2016-11-13 DIAGNOSIS — D509 Iron deficiency anemia, unspecified: Secondary | ICD-10-CM | POA: Diagnosis not present

## 2016-11-15 DIAGNOSIS — E8770 Fluid overload, unspecified: Secondary | ICD-10-CM | POA: Diagnosis not present

## 2016-11-15 DIAGNOSIS — N2581 Secondary hyperparathyroidism of renal origin: Secondary | ICD-10-CM | POA: Diagnosis not present

## 2016-11-15 DIAGNOSIS — D631 Anemia in chronic kidney disease: Secondary | ICD-10-CM | POA: Diagnosis not present

## 2016-11-15 DIAGNOSIS — D509 Iron deficiency anemia, unspecified: Secondary | ICD-10-CM | POA: Diagnosis not present

## 2016-11-15 DIAGNOSIS — N186 End stage renal disease: Secondary | ICD-10-CM | POA: Diagnosis not present

## 2016-11-17 ENCOUNTER — Inpatient Hospital Stay (HOSPITAL_COMMUNITY)
Admission: EM | Admit: 2016-11-17 | Discharge: 2016-11-21 | DRG: 682 | Disposition: A | Discharge status: Home / Self Care | Payer: Medicare Other | Attending: Nephrology | Admitting: Nephrology

## 2016-11-17 ENCOUNTER — Encounter (HOSPITAL_COMMUNITY): Payer: Self-pay

## 2016-11-17 DIAGNOSIS — R110 Nausea: Secondary | ICD-10-CM

## 2016-11-17 DIAGNOSIS — I160 Hypertensive urgency: Secondary | ICD-10-CM | POA: Diagnosis present

## 2016-11-17 DIAGNOSIS — I120 Hypertensive chronic kidney disease with stage 5 chronic kidney disease or end stage renal disease: Secondary | ICD-10-CM | POA: Diagnosis not present

## 2016-11-17 DIAGNOSIS — R1013 Epigastric pain: Secondary | ICD-10-CM | POA: Diagnosis not present

## 2016-11-17 DIAGNOSIS — K279 Peptic ulcer, site unspecified, unspecified as acute or chronic, without hemorrhage or perforation: Secondary | ICD-10-CM

## 2016-11-17 DIAGNOSIS — R933 Abnormal findings on diagnostic imaging of other parts of digestive tract: Secondary | ICD-10-CM

## 2016-11-17 DIAGNOSIS — Z992 Dependence on renal dialysis: Secondary | ICD-10-CM | POA: Diagnosis not present

## 2016-11-17 DIAGNOSIS — R188 Other ascites: Secondary | ICD-10-CM | POA: Diagnosis not present

## 2016-11-17 DIAGNOSIS — R1084 Generalized abdominal pain: Secondary | ICD-10-CM

## 2016-11-17 DIAGNOSIS — Z940 Kidney transplant status: Secondary | ICD-10-CM

## 2016-11-17 DIAGNOSIS — K2900 Acute gastritis without bleeding: Secondary | ICD-10-CM | POA: Diagnosis present

## 2016-11-17 DIAGNOSIS — Z79899 Other long term (current) drug therapy: Secondary | ICD-10-CM

## 2016-11-17 DIAGNOSIS — N186 End stage renal disease: Secondary | ICD-10-CM | POA: Diagnosis not present

## 2016-11-17 DIAGNOSIS — I10 Essential (primary) hypertension: Secondary | ICD-10-CM | POA: Diagnosis not present

## 2016-11-17 DIAGNOSIS — R52 Pain, unspecified: Secondary | ICD-10-CM

## 2016-11-17 DIAGNOSIS — T8612 Kidney transplant failure: Secondary | ICD-10-CM | POA: Diagnosis present

## 2016-11-17 LAB — COMPREHENSIVE METABOLIC PANEL
ALBUMIN: 4.2 g/dL (ref 3.5–5.0)
ALT: 13 U/L — ABNORMAL LOW (ref 14–54)
AST: 21 U/L (ref 15–41)
Alkaline Phosphatase: 73 U/L (ref 38–126)
Anion gap: 17 — ABNORMAL HIGH (ref 5–15)
BUN: 31 mg/dL — AB (ref 6–20)
CHLORIDE: 94 mmol/L — AB (ref 101–111)
CO2: 24 mmol/L (ref 22–32)
Calcium: 9.8 mg/dL (ref 8.9–10.3)
Creatinine, Ser: 11.74 mg/dL — ABNORMAL HIGH (ref 0.44–1.00)
GFR calc Af Amer: 4 mL/min — ABNORMAL LOW (ref >60)
GFR calc non Af Amer: 4 mL/min — ABNORMAL LOW (ref >60)
GLUCOSE: 74 mg/dL (ref 65–99)
POTASSIUM: 4.6 mmol/L (ref 3.5–5.1)
Sodium: 135 mmol/L (ref 135–145)
Total Bilirubin: 0.8 mg/dL (ref 0.3–1.2)
Total Protein: 7.5 g/dL (ref 6.5–8.1)

## 2016-11-17 LAB — CBC
HEMATOCRIT: 36.8 % (ref 36.0–46.0)
Hemoglobin: 12 g/dL (ref 12.0–15.0)
MCH: 30.3 pg (ref 26.0–34.0)
MCHC: 32.6 g/dL (ref 30.0–36.0)
MCV: 92.9 fL (ref 78.0–100.0)
Platelets: 214 10*3/uL (ref 150–400)
RBC: 3.96 MIL/uL (ref 3.87–5.11)
RDW: 17.1 % — AB (ref 11.5–15.5)
WBC: 10.2 10*3/uL (ref 4.0–10.5)

## 2016-11-17 LAB — LIPASE, BLOOD: LIPASE: 23 U/L (ref 11–51)

## 2016-11-17 MED ORDER — DIPHENHYDRAMINE HCL 50 MG/ML IJ SOLN
25.0000 mg | Freq: Once | INTRAMUSCULAR | Status: AC
  Administered 2016-11-17: 25 mg via INTRAVENOUS
  Filled 2016-11-17: qty 1

## 2016-11-17 MED ORDER — HYDROMORPHONE HCL 1 MG/ML IJ SOLN
1.0000 mg | Freq: Once | INTRAMUSCULAR | Status: AC
  Administered 2016-11-17: 1 mg via INTRAVENOUS
  Filled 2016-11-17: qty 1

## 2016-11-17 MED ORDER — LABETALOL HCL 5 MG/ML IV SOLN
20.0000 mg | Freq: Once | INTRAVENOUS | Status: AC
  Administered 2016-11-17: 20 mg via INTRAVENOUS
  Filled 2016-11-17: qty 4

## 2016-11-17 MED ORDER — ONDANSETRON HCL 4 MG/2ML IJ SOLN
4.0000 mg | Freq: Once | INTRAMUSCULAR | Status: AC
  Administered 2016-11-17: 4 mg via INTRAVENOUS
  Filled 2016-11-17: qty 2

## 2016-11-17 MED ORDER — CLONIDINE HCL 0.1 MG PO TABS: 0.1000 mg | ORAL_TABLET | Freq: Once | ORAL | Status: DC

## 2016-11-17 MED ORDER — HYDRALAZINE HCL 20 MG/ML IJ SOLN
20.0000 mg | Freq: Once | INTRAMUSCULAR | Status: AC
  Administered 2016-11-17: 20 mg via INTRAVENOUS
  Filled 2016-11-17: qty 1

## 2016-11-17 MED ORDER — PANTOPRAZOLE SODIUM 40 MG IV SOLR
40.0000 mg | Freq: Once | INTRAVENOUS | Status: AC
  Administered 2016-11-17: 40 mg via INTRAVENOUS
  Filled 2016-11-17: qty 40

## 2016-11-17 MED ORDER — LABETALOL HCL 5 MG/ML IV SOLN
40.0000 mg | Freq: Once | INTRAVENOUS | Status: AC
  Administered 2016-11-17: 40 mg via INTRAVENOUS
  Filled 2016-11-17: qty 8

## 2016-11-17 NOTE — H&P (Addendum)
History and Physical    Kerry Cook [name]:[id number] DOB: 06/12/1986 DOA: 11/17/2016  PCP: [name], FNP Consultants:  [facility] - nephrology Patient coming from: home - lives with fiance and his parents; [place], [phone]  Chief Complaint: abdominal pain  HPI: Kerry Cook is a 31 y.o. 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  The history was provided by her fiance because the patient was alternately writhing in pain and sleeping during my evaluation. He reports that from time to time her ulcers bother her.  Sometimes she can handle them at home but when they get really bad she has to come to the ER.  The only thing that really helps is pain medicine.  She has changed her diet as instructed.  When the pain starts, her BP goes really high.  It is high at baseline but pain makes it worse.  Pain started about 2pm today.  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  No vomiting until she was given "some solution" here in the ER.  Her last EGD was in 10/17 and showed non-bleeding pre-pyloric and pyloric superficial gastric ulcers with benign pathology.  She has had many ER visits and a number of admissions and her BPs are elevated in the current range at most of these visits.   ED Course:  Dilaudid, Protonix, and Zofran IV given.  C/o itching, given IV Benadryl.  BP markedly elevated - treated with Labetalol, Hydralazine, Clonidine.  Review of Systems: Unable to perform due to alternating pain and somnolence   Ambulatory Status:  Ambulates without assistance  Past Medical History:  Diagnosis Date  . Chronic kidney disease    previous hx dialysis  . Dialysis patient ([place])   . History of kidney transplant 2012   kidney failure due to hypertension  . Hypertension   . Peptic ulcer     Past Surgical History:  Procedure Laterality  Date  . ESOPHAGOGASTRODUODENOSCOPY N/A 04/01/2016   Procedure: ESOPHAGOGASTRODUODENOSCOPY (EGD);  Surgeon: Gatha Mayer, MD;  Location: [facility] ENDOSCOPY;  Service: Endoscopy;  Laterality: N/A;  . KIDNEY TRANSPLANT Bilateral 2012    Social History   Social History  . Marital status: Single    Spouse name: N/A  . Number of children: 0  . Years of education: N/A   Occupational History  . Daycare    Social History Main Topics  . Smoking status: Never Smoker  . Smokeless tobacco: Never Used  . Alcohol use No  . Drug use: No  . Sexual activity: Yes    Partners: Male    Birth control/ protection: Condom, None   Other Topics Concern  . Not on file   Social History Narrative  . No narrative on file    No Known Allergies  Family History  Problem Relation Age of Onset  . Kidney disease Father   . Lupus Maternal Grandmother   . Cancer Maternal Grandfather   . Colon cancer Neg Hx   . Stomach cancer Neg Hx   . Rectal cancer Neg Hx   . Esophageal cancer Neg Hx   . Liver cancer Neg Hx     Prior to Admission medications   Medication Sig Start Date End Date Taking? Authorizing Provider  acetaminophen (TYLENOL) 325 MG tablet Take 650 mg by mouth every 6 (six) hours as needed for headache (pain).   Yes [provider]  amLODipine (NORVASC) 10 MG tablet Take 1 tablet (10 mg total) by mouth every evening. Patient taking differently: Take 10 mg by mouth daily.  09/13/15  Yes [name], MD  cloNIDine (CATAPRES) 0.1 MG tablet Take 0.1 mg by mouth 2 (two) times daily.   Yes [provider]  labetalol (NORMODYNE) 200 MG tablet Take 1 tablet (200 mg total) by mouth 2 (two) times daily. 04/02/16  Yes Elgergawy, Silver Huguenin, MD  losartan (COZAAR) 100 MG tablet Take 1 tablet (100 mg total) by mouth every evening. 05/21/16  Yes Verlee Monte, MD  multivitamin (RENA-VIT) TABS tablet Take 1 tablet by mouth at bedtime. 06/09/15  Yes Debbe Odea, MD  ondansetron (ZOFRAN ODT)  8 MG disintegrating tablet Take 1 tablet (8 mg total) by mouth every 8 (eight) hours as needed for nausea or vomiting. 03/24/16  Yes Jola Schmidt, MD  pantoprazole (PROTONIX) 40 MG tablet Take 1 tablet (40 mg total) by mouth 2 (two) times daily. 10/17/16  Yes Levin Erp, PA  PRESCRIPTION MEDICATION Take 1 tablet by mouth 3 (three) times daily with meals. Phosphorus binder from dialysis   Yes [provider]  ranitidine (ZANTAC) 150 MG tablet Take 1 tablet (150 mg total) by mouth 2 (two) times daily. 10/17/16  Yes Levin Erp, PA  Simethicone (GAS-X PO) Take 1 tablet by mouth daily as needed (flatulence/bloatin).   Yes [provider]    Physical Exam: Vitals:   11/17/16 2300 11/17/16 2315 11/17/16 2330 11/17/16 2345  BP: (!) 178/130 (!) 181/110 (!) 161/115 (!) 182/122  Pulse: (!) 116 99 (!) 110 (!) 103  Resp:      Temp:      TempSrc:      SpO2: 100% 100% 100% 100%  Weight:      Height:         General: Intermittently writhing and moaning in pain and then drifting off to sleep Eyes:  PERRL, EOMI, normal lids, iris ENT:  grossly normal hearing, lips & tongue, mmm Neck:  no LAD, masses or thyromegaly Cardiovascular:  RRR, no m/r/g. No LE edema.  Respiratory:  CTA bilaterally, no w/r/r. Normal respiratory effort. Abdomen:  soft, mildly diffusely tender but worse in midepigastric region, nd, NABS Skin:  no rash or induration seen on limited exam.  Fistula in left forearm. Musculoskeletal:  grossly normal tone BUE/BLE, good ROM, no bony abnormality Psychiatric:  grossly normal mood and affect, speech fluent and appropriate, AOx3 Neurologic:  CN 2-12 grossly intact, moves all extremities in coordinated fashion, sensation intact  Labs on Admission: I have personally reviewed following labs and imaging studies  CBC:  Recent Labs Lab 11/17/16 1947  WBC 10.2  HGB 12.0  HCT 36.8  MCV 92.9  PLT 235   Basic Metabolic Panel:  Recent Labs Lab  11/17/16 1947  NA 135  K 4.6  CL 94*  CO2 24  GLUCOSE 74  BUN 31*  CREATININE 11.74*  CALCIUM 9.8   GFR: Estimated Creatinine Clearance: 5.5 mL/min (A) (by C-G formula based on SCr of 11.74 mg/dL (H)). Liver Function Tests:  Recent Labs Lab 11/17/16 1947  AST 21  ALT 13*  ALKPHOS 73  BILITOT 0.8  PROT 7.5  ALBUMIN 4.2    Recent Labs Lab 11/17/16 1947  LIPASE 23   No results for input(s): AMMONIA in the last 168 hours. Coagulation Profile: No results for input(s): INR, PROTIME in the last 168 hours. Cardiac Enzymes: No results for input(s):  CKTOTAL, CKMB, CKMBINDEX, TROPONINI in the last 168 hours. BNP (last 3 results) No results for input(s): PROBNP in the last 8760 hours. HbA1C: No results for input(s): HGBA1C in the last 72 hours. CBG: No results for input(s): GLUCAP in the last 168 hours. Lipid Profile: No results for input(s): CHOL, HDL, LDLCALC, TRIG, CHOLHDL, LDLDIRECT in the last 72 hours. Thyroid Function Tests: No results for input(s): TSH, T4TOTAL, FREET4, T3FREE, THYROIDAB in the last 72 hours. Anemia Panel: No results for input(s): VITAMINB12, FOLATE, FERRITIN, TIBC, IRON, RETICCTPCT in the last 72 hours. Urine analysis:    Component Value Date/Time   COLORURINE YELLOW 01/21/2015 1343   APPEARANCEUR HAZY (A) 01/21/2015 1343   LABSPEC 1.009 01/21/2015 1343   PHURINE 8.5 (H) 01/21/2015 1343   GLUCOSEU 100 (A) 01/21/2015 1343   HGBUR SMALL (A) 01/21/2015 1343   BILIRUBINUR NEGATIVE 01/21/2015 1343   KETONESUR 15 (A) 01/21/2015 1343   PROTEINUR >300 (A) 01/21/2015 1343   UROBILINOGEN 0.2 01/21/2015 1343   NITRITE NEGATIVE 01/21/2015 1343   LEUKOCYTESUR NEGATIVE 01/21/2015 1343    Creatinine Clearance: Estimated Creatinine Clearance: 5.5 mL/min (A) (by C-G formula based on SCr of 11.74 mg/dL (H)).  Sepsis Labs: @LABRCNTIP (procalcitonin:4,lacticidven:4) )No results found for this or any previous visit (from the past 240 hour(s)).    Radiological Exams on Admission: No results found.  EKG: Not done  Assessment/Plan Principal Problem:   Uncontrolled hypertension Active Problems:   End stage renal disease on dialysis ([facility])   Acute gastritis without hemorrhage   Uncontrolled HTN -patient presenting with epigastric pain but found to have markedly elevated BP -In review of her chart, most records from the ER indicate very poor long-standing HTN control -This would be consistent with her young age with ESRD and failed transplant associated with HTN -Her HR is elevated enough to indicate possible non-compliance with her beta blocker (she is on 200 mg of labetalol BID and this would be expected to cause bradycardia at this dose) -If she is non-compliant with one medication, there is a reasonable chance that she is non-compliant with other medications -Since this is long-standing, it is unlikely to be fixed overnight -Will continue her chronic medications and add hydralazine IV prn -Anticipate that it would be reasonable to discharge her tomorrow if she has BP control that is at least out of stroke range.  ESRD  -BUN 31/Creatinine 11.74 - on HD -Anion gap 17 -She is likely to need HD tomorrow before D/C since she is on the MWF schedule  Gastritis -Patient with h/o ulcers and gastritis -She is supposed to be taking Protonix BID but a prior note indicated possible non-compliance here as well -Will give Protonix IV BID for now -Zofran prn n/v -Carafate, Zantac as needed -Tylenol prn pain -I would be reluctant to give her Dilaudid for this issue as it is generally not indicated for this issue. -Of course she cannot take NSAIDs due to the ulcers as well as the ESRD. -Of note, she is of child-bearing age and had a quantitative HCG of 11.5 earlier this month.  Will repeat quant now and certainly pregnancy would broaden the differential for the source of this pain. -Clear liquid diet for now, advance as tolerated.  DVT  prophylaxis:  Lovenox  Code Status:  Full - confirmed with patient/family Family Communication: Fiance present throughout evaluation Disposition Plan:  Home once clinically improved Consults called: None  Admission status: It is my clinical opinion that referral for OBSERVATION is reasonable and necessary in  this patient based on the above information provided. The aforementioned taken together are felt to place the patient at high risk for further clinical deterioration. However it is anticipated that the patient may be medically stable for discharge from the hospital within 24 to 48 hours.    Karmen Bongo MD [facility]  If 7PM-7AM, please contact night-coverage [email or web address] Password [email or web address]  11/18/2016, 12:22 AM

## 2016-11-17 NOTE — ED Provider Notes (Signed)
[facility] DEPT Provider Note   CSN: [id number] Arrival date & time: 11/17/16  1933     History   Chief Complaint Chief Complaint  Patient presents with  . Abdominal Pain    HPI Kerry Cook is a 31 y.o. female.  Patient with hx esrd, on hd M/W/F, normal HD 2 days ago, c/o epigastric pain for past day. Pain constant, dull, severe, non radiating. Same as pain w prior pud.  States compliant w home meds, including protonix bid. Nausea. No vomiting. Did have normal bm today. No fever or chills. No back or flank pain. States makes no urine at baseline. No chest pain or sob. No cough or uri c/o. Denies hx gallstones or pancreatitis.    The history is provided by the patient.  Abdominal Pain   Associated symptoms include nausea. Pertinent negatives include fever, diarrhea, constipation and headaches.    Past Medical History:  Diagnosis Date  . Chronic kidney disease    previous hx dialysis  . Dialysis patient ([place])   . History of kidney transplant 2012   kidney failure due to hypertension  . Hypertension   . Peptic ulcer     Patient Active Problem List   Diagnosis Date Noted  . Malignant hypertension 05/19/2016  . Acute gastritis without hemorrhage 05/19/2016  . Hyperkalemia 05/19/2016  . Hypertensive crisis 05/19/2016  . Peptic ulcer   . Abnormal CT scan, small bowel   . Multiple gastric ulcers   . Gastric outlet obstruction 03/31/2016  . Medicare annual wellness visit, initial 10/31/2015  . ESRD (end stage renal disease) on dialysis ([place])   . Cephalalgia   . History of renal transplant   . Headache 06/07/2015  . Hypertensive urgency 02/16/2015  . Absolute anemia   . SOB (shortness of breath) 02/01/2015  . Hypertension   . History of kidney transplant   . Pneumonia 01/23/2015  . Tachycardia   . Symptomatic anemia 01/20/2015  . Supervision of high risk pregnancy, antepartum 06/02/2014  . Anemia in chronic kidney disease 10/01/2007  . THROMBOCYTOPENIA  10/01/2007  . End stage renal disease on dialysis ([place]) 10/01/2007  . PAP SMEAR, LGSIL, ABNORMAL 10/01/2007  . HYPERTENSION, BENIGN SYSTEMIC 08/21/2006  . PROTEINURIA 08/21/2006    Past Surgical History:  Procedure Laterality Date  . ESOPHAGOGASTRODUODENOSCOPY N/A 04/01/2016   Procedure: ESOPHAGOGASTRODUODENOSCOPY (EGD);  Surgeon: Gatha Mayer, MD;  Location: [facility] ENDOSCOPY;  Service: Endoscopy;  Laterality: N/A;  . KIDNEY TRANSPLANT Bilateral 2012    OB History    Gravida Para Term Preterm AB Living   1       1 0   SAB TAB Ectopic Multiple Live Births   1               Home Medications    Prior to Admission medications   Medication Sig Start Date End Date Taking? Authorizing Provider  acetaminophen (TYLENOL) 325 MG tablet Take 650 mg by mouth every 6 (six) hours as needed for headache (pain).   Yes [provider]  amLODipine (NORVASC) 10 MG tablet Take 1 tablet (10 mg total) by mouth every evening. Patient taking differently: Take 10 mg by mouth daily.  09/13/15  Yes [name], MD  cloNIDine (CATAPRES) 0.1 MG tablet Take 0.1 mg by mouth 2 (two) times daily.   Yes [provider]  labetalol (NORMODYNE) 200 MG tablet Take 1 tablet (200 mg total) by mouth 2 (two) times daily. 04/02/16  Yes Elgergawy, Silver Huguenin, MD  losartan (  COZAAR) 100 MG tablet Take 1 tablet (100 mg total) by mouth every evening. 05/21/16  Yes Verlee Monte, MD  multivitamin (RENA-VIT) TABS tablet Take 1 tablet by mouth at bedtime. 06/09/15  Yes Debbe Odea, MD  ondansetron (ZOFRAN ODT) 8 MG disintegrating tablet Take 1 tablet (8 mg total) by mouth every 8 (eight) hours as needed for nausea or vomiting. 03/24/16  Yes Jola Schmidt, MD  pantoprazole (PROTONIX) 40 MG tablet Take 1 tablet (40 mg total) by mouth 2 (two) times daily. 10/17/16  Yes Levin Erp, PA  PRESCRIPTION MEDICATION Take 1 tablet by mouth 3 (three) times daily with meals. Phosphorus binder from dialysis   Yes  [provider]  ranitidine (ZANTAC) 150 MG tablet Take 1 tablet (150 mg total) by mouth 2 (two) times daily. 10/17/16  Yes Levin Erp, PA  Simethicone (GAS-X PO) Take 1 tablet by mouth daily as needed (flatulence/bloatin).   Yes [provider]    Family History Family History  Problem Relation Age of Onset  . Kidney disease Father   . Lupus Maternal Grandmother   . Cancer Maternal Grandfather   . Colon cancer Neg Hx   . Stomach cancer Neg Hx   . Rectal cancer Neg Hx   . Esophageal cancer Neg Hx   . Liver cancer Neg Hx     Social History Social History  Substance Use Topics  . Smoking status: Never Smoker  . Smokeless tobacco: Never Used  . Alcohol use No     Allergies   Patient has no known allergies.   Review of Systems Review of Systems  Constitutional: Negative for fever.  HENT: Negative for sore throat.   Eyes: Negative for redness.  Respiratory: Negative for cough and shortness of breath.   Cardiovascular: Negative for chest pain.  Gastrointestinal: Positive for abdominal pain and nausea. Negative for constipation and diarrhea.  Genitourinary: Negative for flank pain.  Musculoskeletal: Negative for back pain.  Skin: Negative for rash.  Neurological: Negative for headaches.  Hematological: Does not bruise/bleed easily.  Psychiatric/Behavioral: Negative for confusion.     Physical Exam Updated Vital Signs BP (!) 217/151 (BP Location: Right Arm)   Pulse 98   Temp 98.9 F (37.2 C) (Oral)   Resp 18   Ht 1.524 m (5')   Wt 56.7 kg (125 lb)   LMP 10/26/2016   SpO2 98%   BMI 24.41 kg/m   Physical Exam  Constitutional: She appears well-developed and well-nourished. No distress.  HENT:  Mouth/Throat: Oropharynx is clear and moist.  Eyes: Conjunctivae are normal. No scleral icterus.  Neck: Neck supple. No tracheal deviation present.  Cardiovascular: Normal rate, regular rhythm, normal heart sounds and intact distal pulses.   Exam reveals no gallop and no friction rub.   No murmur heard. Pulmonary/Chest: Effort normal and breath sounds normal. No respiratory distress.  Abdominal: Soft. Normal appearance and bowel sounds are normal. She exhibits no distension and no mass. There is tenderness. There is no rebound and no guarding. No hernia.  Epigastric tenderness.   Genitourinary:  Genitourinary Comments: No cva tenderness  Musculoskeletal: She exhibits no edema.  Neurological: She is alert.  Skin: Skin is warm and dry. No rash noted. She is not diaphoretic.  Psychiatric:  Anxious appearing.   Nursing note and vitals reviewed.    ED Treatments / Results  Labs (all labs ordered are listed, but only abnormal results are displayed) Results for orders placed or performed during the hospital encounter of  11/17/16  Lipase, blood  Result Value Ref Range   Lipase 23 11 - 51 U/L  Comprehensive metabolic panel  Result Value Ref Range   Sodium 135 135 - 145 mmol/L   Potassium 4.6 3.5 - 5.1 mmol/L   Chloride 94 (L) 101 - 111 mmol/L   CO2 24 22 - 32 mmol/L   Glucose, Bld 74 65 - 99 mg/dL   BUN 31 (H) 6 - 20 mg/dL   Creatinine, Ser 11.74 (H) 0.44 - 1.00 mg/dL   Calcium 9.8 8.9 - 10.3 mg/dL   Total Protein 7.5 6.5 - 8.1 g/dL   Albumin 4.2 3.5 - 5.0 g/dL   AST 21 15 - 41 U/L   ALT 13 (L) 14 - 54 U/L   Alkaline Phosphatase 73 38 - 126 U/L   Total Bilirubin 0.8 0.3 - 1.2 mg/dL   GFR calc non Af Amer 4 (L) >60 mL/min   GFR calc Af Amer 4 (L) >60 mL/min   Anion gap 17 (H) 5 - 15  CBC  Result Value Ref Range   WBC 10.2 4.0 - 10.5 K/uL   RBC 3.96 3.87 - 5.11 MIL/uL   Hemoglobin 12.0 12.0 - 15.0 g/dL   HCT 36.8 36.0 - 46.0 %   MCV 92.9 78.0 - 100.0 fL   MCH 30.3 26.0 - 34.0 pg   MCHC 32.6 30.0 - 36.0 g/dL   RDW 17.1 (H) 11.5 - 15.5 %   Platelets 214 150 - 400 K/uL   Dg Abd Acute W/chest  Result Date: 11/08/2016 CLINICAL DATA:  Epigastric pain EXAM: DG ABDOMEN ACUTE W/ 1V CHEST COMPARISON:  05/19/2016,  03/31/2016 FINDINGS: Single-view chest demonstrates mild cardiomegaly. No infiltrate or edema. Supine and upright views of the abdomen demonstrate no free air beneath the diaphragm. Overall nonobstructed gas pattern with decreased bowel gas and scattered fluid levels. A amorphous calcifications in the right lower quadrant are unchanged and correspond to calcified transplant. IMPRESSION: Cardiomegaly without edema or infiltrate. Nonobstructed gas pattern. Electronically Signed   By: Donavan Foil M.D.   On: 11/08/2016 16:28    EKG  EKG Interpretation None       Radiology No results found.  Procedures Procedures (including critical care time)  Medications Ordered in ED Medications  cloNIDine (CATAPRES) tablet 0.1 mg (not administered)  diphenhydrAMINE (BENADRYL) injection 25 mg (not administered)  HYDROmorphone (DILAUDID) injection 1 mg (1 mg Intravenous Given 11/17/16 2221)  pantoprazole (PROTONIX) injection 40 mg (40 mg Intravenous Given 11/17/16 0003)  ondansetron (ZOFRAN) injection 4 mg (4 mg Intravenous Given 11/17/16 2221)  labetalol (NORMODYNE,TRANDATE) injection 20 mg (20 mg Intravenous Given 11/17/16 2226)  hydrALAZINE (APRESOLINE) injection 20 mg (20 mg Intravenous Given 11/17/16 2225)     Initial Impression / Assessment and Plan / ED Course  I have reviewed the triage vital signs and the nursing notes.  Pertinent labs & imaging results that were available during my care of the patient were reviewed by me and considered in my medical decision making (see chart for details).  Iv ns. Dilaudid 1 mg iv for pain. protonix iv. zofran iv.  Pt c/o itching. No rash. No wheezing. Benadryl iv.   Reviewed nursing notes and prior charts for additional history.   bp very high.  Pt given labetalol and hydralazine iv.  As nausea improves, given dose of her clonidine as well. bp remains high. Will give additional rx.   Medicine service consulted for admission.    Final Clinical  Impressions(s) / ED  Diagnoses   Final diagnoses:  None    New Prescriptions New Prescriptions   No medications on file     Lajean Saver, MD 11/17/16 2255

## 2016-11-17 NOTE — ED Notes (Signed)
Continues to not be able to tolerate PO fluids

## 2016-11-17 NOTE — ED Triage Notes (Signed)
Pt presents to the ed with complaints of abdominal pain x 2 days in the middle of her stomach, states that this resembles the time she had an ulcer.  She is a dialysis patient and last went on Friday.  Reports some mild nausea

## 2016-11-17 NOTE — ED Notes (Signed)
md AT [facility]

## 2016-11-17 NOTE — ED Notes (Signed)
Pt refusing staff to place IV access, wants IV team.

## 2016-11-17 NOTE — ED Notes (Signed)
Bethanne Ginger MD at bedside

## 2016-11-18 ENCOUNTER — Observation Stay (HOSPITAL_COMMUNITY): Payer: Medicare Other

## 2016-11-18 ENCOUNTER — Encounter (HOSPITAL_COMMUNITY): Payer: Self-pay | Admitting: General Practice

## 2016-11-18 DIAGNOSIS — R933 Abnormal findings on diagnostic imaging of other parts of digestive tract: Secondary | ICD-10-CM | POA: Diagnosis not present

## 2016-11-18 DIAGNOSIS — R109 Unspecified abdominal pain: Secondary | ICD-10-CM | POA: Diagnosis not present

## 2016-11-18 DIAGNOSIS — D631 Anemia in chronic kidney disease: Secondary | ICD-10-CM | POA: Diagnosis not present

## 2016-11-18 DIAGNOSIS — R1084 Generalized abdominal pain: Secondary | ICD-10-CM | POA: Diagnosis not present

## 2016-11-18 DIAGNOSIS — Z992 Dependence on renal dialysis: Secondary | ICD-10-CM | POA: Diagnosis not present

## 2016-11-18 DIAGNOSIS — K2950 Unspecified chronic gastritis without bleeding: Secondary | ICD-10-CM | POA: Diagnosis not present

## 2016-11-18 DIAGNOSIS — R110 Nausea: Secondary | ICD-10-CM | POA: Diagnosis not present

## 2016-11-18 DIAGNOSIS — R1013 Epigastric pain: Secondary | ICD-10-CM | POA: Diagnosis not present

## 2016-11-18 DIAGNOSIS — T8612 Kidney transplant failure: Secondary | ICD-10-CM | POA: Diagnosis present

## 2016-11-18 DIAGNOSIS — Z79899 Other long term (current) drug therapy: Secondary | ICD-10-CM | POA: Diagnosis not present

## 2016-11-18 DIAGNOSIS — N2581 Secondary hyperparathyroidism of renal origin: Secondary | ICD-10-CM | POA: Diagnosis not present

## 2016-11-18 DIAGNOSIS — I120 Hypertensive chronic kidney disease with stage 5 chronic kidney disease or end stage renal disease: Secondary | ICD-10-CM | POA: Diagnosis not present

## 2016-11-18 DIAGNOSIS — I10 Essential (primary) hypertension: Secondary | ICD-10-CM | POA: Diagnosis not present

## 2016-11-18 DIAGNOSIS — K2900 Acute gastritis without bleeding: Secondary | ICD-10-CM | POA: Diagnosis not present

## 2016-11-18 DIAGNOSIS — R112 Nausea with vomiting, unspecified: Secondary | ICD-10-CM | POA: Diagnosis not present

## 2016-11-18 DIAGNOSIS — N186 End stage renal disease: Secondary | ICD-10-CM | POA: Diagnosis not present

## 2016-11-18 DIAGNOSIS — Z940 Kidney transplant status: Secondary | ICD-10-CM | POA: Diagnosis not present

## 2016-11-18 DIAGNOSIS — T8610 Unspecified complication of kidney transplant: Secondary | ICD-10-CM | POA: Diagnosis not present

## 2016-11-18 DIAGNOSIS — D696 Thrombocytopenia, unspecified: Secondary | ICD-10-CM | POA: Diagnosis not present

## 2016-11-18 DIAGNOSIS — R188 Other ascites: Secondary | ICD-10-CM | POA: Diagnosis not present

## 2016-11-18 DIAGNOSIS — I160 Hypertensive urgency: Secondary | ICD-10-CM | POA: Diagnosis present

## 2016-11-18 LAB — HIV ANTIBODY (ROUTINE TESTING W REFLEX): HIV SCREEN 4TH GENERATION: NONREACTIVE

## 2016-11-18 LAB — CBC
HEMATOCRIT: 31.1 % — AB (ref 36.0–46.0)
HEMATOCRIT: 33.2 % — AB (ref 36.0–46.0)
HEMOGLOBIN: 10.9 g/dL — AB (ref 12.0–15.0)
Hemoglobin: 10.1 g/dL — ABNORMAL LOW (ref 12.0–15.0)
MCH: 30.2 pg (ref 26.0–34.0)
MCH: 30.1 pg (ref 26.0–34.0)
MCHC: 32.5 g/dL (ref 30.0–36.0)
MCHC: 32.8 g/dL (ref 30.0–36.0)
MCV: 93.1 fL (ref 78.0–100.0)
MCV: 91.7 fL (ref 78.0–100.0)
PLATELETS: 221 10*3/uL (ref 150–400)
Platelets: 213 10*3/uL (ref 150–400)
RBC: 3.34 MIL/uL — ABNORMAL LOW (ref 3.87–5.11)
RBC: 3.62 MIL/uL — ABNORMAL LOW (ref 3.87–5.11)
RDW: 17.4 % — AB (ref 11.5–15.5)
RDW: 17.5 % — ABNORMAL HIGH (ref 11.5–15.5)
WBC: 10 10*3/uL (ref 4.0–10.5)
WBC: 11 10*3/uL — ABNORMAL HIGH (ref 4.0–10.5)

## 2016-11-18 LAB — RENAL FUNCTION PANEL
ANION GAP: 17 — AB (ref 5–15)
Albumin: 3.5 g/dL (ref 3.5–5.0)
BUN: 46 mg/dL — ABNORMAL HIGH (ref 6–20)
CHLORIDE: 96 mmol/L — AB (ref 101–111)
CO2: 24 mmol/L (ref 22–32)
Calcium: 9.2 mg/dL (ref 8.9–10.3)
Creatinine, Ser: 12.97 mg/dL — ABNORMAL HIGH (ref 0.44–1.00)
GFR calc Af Amer: 4 mL/min — ABNORMAL LOW (ref >60)
GFR calc non Af Amer: 3 mL/min — ABNORMAL LOW (ref >60)
GLUCOSE: 80 mg/dL (ref 65–99)
POTASSIUM: 5.3 mmol/L — AB (ref 3.5–5.1)
Phosphorus: 6 mg/dL — ABNORMAL HIGH (ref 2.5–4.6)
Sodium: 137 mmol/L (ref 135–145)

## 2016-11-18 LAB — BASIC METABOLIC PANEL
Anion gap: 15 (ref 5–15)
BUN: 42 mg/dL — AB (ref 6–20)
CO2: 27 mmol/L (ref 22–32)
CREATININE: 12.76 mg/dL — AB (ref 0.44–1.00)
Calcium: 9.2 mg/dL (ref 8.9–10.3)
Chloride: 97 mmol/L — ABNORMAL LOW (ref 101–111)
GFR calc Af Amer: 4 mL/min — ABNORMAL LOW (ref >60)
GFR, EST NON AFRICAN AMERICAN: 3 mL/min — AB (ref >60)
GLUCOSE: 78 mg/dL (ref 65–99)
POTASSIUM: 5.2 mmol/L — AB (ref 3.5–5.1)
SODIUM: 139 mmol/L (ref 135–145)

## 2016-11-18 LAB — HCG, QUANTITATIVE, PREGNANCY: hCG, Beta Chain, Quant, S: 3 m[IU]/mL (ref <5)

## 2016-11-18 LAB — PHOSPHORUS: Phosphorus: 7.5 mg/dL — ABNORMAL HIGH (ref 2.5–4.6)

## 2016-11-18 LAB — MRSA PCR SCREENING: MRSA by PCR: NEGATIVE

## 2016-11-18 MED ORDER — DOXERCALCIFEROL 4 MCG/2ML IV SOLN
4.0000 ug | INTRAVENOUS | Status: DC
  Administered 2016-11-18 – 2016-11-20 (×2): 4 ug via INTRAVENOUS
  Filled 2016-11-18: qty 2

## 2016-11-18 MED ORDER — HYDRALAZINE HCL 20 MG/ML IJ SOLN
INTRAMUSCULAR | Status: AC
  Administered 2016-11-18: 10 mg via INTRAVENOUS
  Filled 2016-11-18: qty 1

## 2016-11-18 MED ORDER — OXYCODONE-ACETAMINOPHEN 5-325 MG PO TABS
ORAL_TABLET | ORAL | Status: AC
  Filled 2016-11-18: qty 2

## 2016-11-18 MED ORDER — IOPAMIDOL (ISOVUE-300) INJECTION 61%
INTRAVENOUS | Status: AC
  Administered 2016-11-18: 75 mL
  Filled 2016-11-18: qty 75

## 2016-11-18 MED ORDER — ONDANSETRON HCL 4 MG/2ML IJ SOLN
4.0000 mg | Freq: Four times a day (QID) | INTRAMUSCULAR | Status: DC | PRN
  Administered 2016-11-18 (×2): 4 mg via INTRAVENOUS
  Filled 2016-11-18 (×3): qty 2

## 2016-11-18 MED ORDER — DOCUSATE SODIUM 100 MG PO CAPS
100.0000 mg | ORAL_CAPSULE | Freq: Two times a day (BID) | ORAL | Status: DC
  Administered 2016-11-18 – 2016-11-20 (×3): 100 mg via ORAL
  Filled 2016-11-18 (×5): qty 1

## 2016-11-18 MED ORDER — OXYCODONE-ACETAMINOPHEN 5-325 MG PO TABS
2.0000 | ORAL_TABLET | ORAL | Status: AC
  Administered 2016-11-18: 2 via ORAL

## 2016-11-18 MED ORDER — DIPHENHYDRAMINE HCL 25 MG PO CAPS
25.0000 mg | ORAL_CAPSULE | Freq: Once | ORAL | Status: AC
  Administered 2016-11-18: 25 mg via ORAL
  Filled 2016-11-18: qty 1

## 2016-11-18 MED ORDER — HEPARIN SODIUM (PORCINE) 1000 UNIT/ML DIALYSIS: 20.0000 [IU]/kg | INTRAMUSCULAR | Status: DC | PRN

## 2016-11-18 MED ORDER — LABETALOL HCL 5 MG/ML IV SOLN
10.0000 mg | INTRAVENOUS | Status: DC | PRN
  Administered 2016-11-18 – 2016-11-19 (×3): 10 mg via INTRAVENOUS
  Filled 2016-11-18 (×4): qty 4

## 2016-11-18 MED ORDER — DOXERCALCIFEROL 4 MCG/2ML IV SOLN
INTRAVENOUS | Status: AC
Start: 2016-11-18 — End: 2016-11-19
  Filled 2016-11-18: qty 2

## 2016-11-18 MED ORDER — HYDRALAZINE HCL 20 MG/ML IJ SOLN
10.0000 mg | INTRAMUSCULAR | Status: DC | PRN
  Administered 2016-11-18 (×2): 10 mg via INTRAVENOUS
  Filled 2016-11-18: qty 1

## 2016-11-18 MED ORDER — ACETAMINOPHEN 650 MG RE SUPP: 650.0000 mg | Freq: Four times a day (QID) | RECTAL | Status: DC | PRN

## 2016-11-18 MED ORDER — ACETAMINOPHEN 325 MG PO TABS
650.0000 mg | ORAL_TABLET | Freq: Four times a day (QID) | ORAL | Status: DC | PRN
  Filled 2016-11-18: qty 2

## 2016-11-18 MED ORDER — SODIUM CHLORIDE 0.9% FLUSH
3.0000 mL | Freq: Two times a day (BID) | INTRAVENOUS | Status: DC
  Administered 2016-11-18 – 2016-11-21 (×6): 3 mL via INTRAVENOUS

## 2016-11-18 MED ORDER — SIMETHICONE 80 MG PO CHEW
160.0000 mg | CHEWABLE_TABLET | Freq: Every day | ORAL | Status: DC | PRN
  Administered 2016-11-18 (×2): 160 mg via ORAL
  Filled 2016-11-18 (×2): qty 2

## 2016-11-18 MED ORDER — LIDOCAINE HCL (PF) 1 % IJ SOLN
5.0000 mL | INTRAMUSCULAR | Status: DC | PRN
  Filled 2016-11-18: qty 5

## 2016-11-18 MED ORDER — CLONIDINE HCL 0.1 MG PO TABS
0.1000 mg | ORAL_TABLET | Freq: Two times a day (BID) | ORAL | Status: DC
  Administered 2016-11-18 – 2016-11-19 (×3): 0.1 mg via ORAL
  Filled 2016-11-18 (×3): qty 1

## 2016-11-18 MED ORDER — HEPARIN SODIUM (PORCINE) 1000 UNIT/ML DIALYSIS: 1000.0000 [IU] | INTRAMUSCULAR | Status: DC | PRN

## 2016-11-18 MED ORDER — SODIUM CHLORIDE 0.9 % IV SOLN: 100.0000 mL | INTRAVENOUS | Status: DC | PRN

## 2016-11-18 MED ORDER — ALTEPLASE 2 MG IJ SOLR: 2.0000 mg | Freq: Once | INTRAMUSCULAR | Status: DC | PRN

## 2016-11-18 MED ORDER — RENA-VITE PO TABS
1.0000 | ORAL_TABLET | Freq: Every day | ORAL | Status: DC
  Administered 2016-11-18 – 2016-11-21 (×3): 1 via ORAL
  Filled 2016-11-18 (×3): qty 1

## 2016-11-18 MED ORDER — PANTOPRAZOLE SODIUM 40 MG IV SOLR: 40.0000 mg | Freq: Two times a day (BID) | INTRAVENOUS | Status: DC

## 2016-11-18 MED ORDER — ENOXAPARIN SODIUM 30 MG/0.3ML ~~LOC~~ SOLN
30.0000 mg | Freq: Every day | SUBCUTANEOUS | Status: DC
  Administered 2016-11-19: 30 mg via SUBCUTANEOUS
  Filled 2016-11-18: qty 0.3

## 2016-11-18 MED ORDER — HYDROMORPHONE HCL 1 MG/ML IJ SOLN
1.0000 mg | Freq: Once | INTRAMUSCULAR | Status: AC
  Administered 2016-11-18: 1 mg via INTRAVENOUS
  Filled 2016-11-18: qty 1

## 2016-11-18 MED ORDER — LIDOCAINE-PRILOCAINE 2.5-2.5 % EX CREA: 1.0000 "application " | TOPICAL_CREAM | CUTANEOUS | Status: DC | PRN

## 2016-11-18 MED ORDER — AMLODIPINE BESYLATE 10 MG PO TABS
10.0000 mg | ORAL_TABLET | Freq: Every day | ORAL | Status: DC
  Administered 2016-11-18 – 2016-11-21 (×4): 10 mg via ORAL
  Filled 2016-11-18 (×4): qty 1

## 2016-11-18 MED ORDER — DIPHENHYDRAMINE HCL 50 MG/ML IJ SOLN
25.0000 mg | Freq: Once | INTRAMUSCULAR | Status: AC
  Administered 2016-11-18: 25 mg via INTRAVENOUS
  Filled 2016-11-18: qty 1

## 2016-11-18 MED ORDER — IOPAMIDOL (ISOVUE-300) INJECTION 61%
INTRAVENOUS | Status: AC
  Administered 2016-11-18: 30 mL
  Filled 2016-11-18: qty 30

## 2016-11-18 MED ORDER — CAMPHOR-MENTHOL 0.5-0.5 % EX LOTN
TOPICAL_LOTION | CUTANEOUS | Status: DC | PRN
  Filled 2016-11-18: qty 222

## 2016-11-18 MED ORDER — LABETALOL HCL 200 MG PO TABS: 200.0000 mg | ORAL_TABLET | Freq: Two times a day (BID) | ORAL | Status: DC

## 2016-11-18 MED ORDER — LOSARTAN POTASSIUM 50 MG PO TABS
100.0000 mg | ORAL_TABLET | Freq: Every evening | ORAL | Status: DC
  Administered 2016-11-18 – 2016-11-21 (×3): 100 mg via ORAL
  Filled 2016-11-18 (×3): qty 2

## 2016-11-18 MED ORDER — SUCRALFATE 1 G PO TABS
1.0000 g | ORAL_TABLET | Freq: Three times a day (TID) | ORAL | Status: DC
  Administered 2016-11-18 (×2): 1 g via ORAL
  Filled 2016-11-18 (×2): qty 1

## 2016-11-18 MED ORDER — HYDROMORPHONE HCL 1 MG/ML IJ SOLN
1.0000 mg | Freq: Three times a day (TID) | INTRAMUSCULAR | Status: DC | PRN
  Administered 2016-11-18 – 2016-11-19 (×2): 1 mg via INTRAVENOUS
  Filled 2016-11-18 (×2): qty 1

## 2016-11-18 MED ORDER — PANTOPRAZOLE SODIUM 40 MG IV SOLR
40.0000 mg | Freq: Two times a day (BID) | INTRAVENOUS | Status: DC
  Administered 2016-11-18 – 2016-11-21 (×7): 40 mg via INTRAVENOUS
  Filled 2016-11-18 (×7): qty 40

## 2016-11-18 MED ORDER — FAMOTIDINE 20 MG PO TABS
20.0000 mg | ORAL_TABLET | Freq: Two times a day (BID) | ORAL | Status: DC
  Administered 2016-11-18 – 2016-11-19 (×3): 20 mg via ORAL
  Filled 2016-11-18 (×3): qty 1

## 2016-11-18 MED ORDER — PENTAFLUOROPROP-TETRAFLUOROETH EX AERO: 1.0000 "application " | INHALATION_SPRAY | CUTANEOUS | Status: DC | PRN

## 2016-11-18 MED ORDER — ONDANSETRON HCL 4 MG PO TABS
4.0000 mg | ORAL_TABLET | Freq: Four times a day (QID) | ORAL | Status: DC | PRN
  Administered 2016-11-19: 4 mg via ORAL
  Filled 2016-11-18: qty 1

## 2016-11-18 NOTE — Consult Note (Signed)
[facility] Renal Consultation Note    Indication for Consultation:  Management of ESRD/hemodialysis; anemia, hypertension/volume and secondary hyperparathyroidism  HPI: Kerry Cook is a 31 y.o. female with ESRD secondary to HTN, h/o failed renal transplant, gastric ulcer, PUD. Presented to [facility] ED with abdominal pain and vomiting. ED course also significant for markedly elevated BP. Admitted  under observation status for further evaluation. EGD 03/2016 with non-bleeding pre-pyloric and pyloric superficial gastric ulcers with benign pathology Patient with frequent visits to ED secondary to abdominal pain/gastritis with elevated BP. Noted some concern about compliance with outpatient medications.   Patient seen at bedside. She says her abdominal pain has improved some since admission but still endorses nausea and no appetite. Having epigastric pain. Falling asleep during questioning. Reviewed BP meds with her and she reports taking meds as directed. Denies chest pain, SOB, hematemesis, diarrhea, melena.   Dialyzes at [facility] MWF. Last HD was Friday 5/25. She completed full treatment and reached target weight. Does have elevated BP readings during HD.   Past Medical History:  Diagnosis Date  . Chronic kidney disease    previous hx dialysis  . Dialysis patient ([place])   . History of kidney transplant 2012   kidney failure due to hypertension  . Hypertension   . Peptic ulcer    Past Surgical History:  Procedure Laterality Date  . ESOPHAGOGASTRODUODENOSCOPY N/A 04/01/2016   Procedure: ESOPHAGOGASTRODUODENOSCOPY (EGD);  Surgeon: Gatha Mayer, MD;  Location: [facility] ENDOSCOPY;  Service: Endoscopy;  Laterality: N/A;  . KIDNEY TRANSPLANT Bilateral 2012   Family History  Problem Relation Age of Onset  . Kidney disease Father   . Lupus Maternal Grandmother   . Cancer Maternal Grandfather   . Colon cancer Neg Hx   . Stomach cancer Neg Hx   . Rectal cancer Neg Hx   . Esophageal  cancer Neg Hx   . Liver cancer Neg Hx    Social History:  reports that she has never smoked. She has never used smokeless tobacco. She reports that she does not drink alcohol or use drugs. No Known Allergies Prior to Admission medications   Medication Sig Start Date End Date Taking? Authorizing Provider  acetaminophen (TYLENOL) 325 MG tablet Take 650 mg by mouth every 6 (six) hours as needed for headache (pain).   Yes [provider]  amLODipine (NORVASC) 10 MG tablet Take 1 tablet (10 mg total) by mouth every evening. Patient taking differently: Take 10 mg by mouth daily.  09/13/15  Yes [name], MD  cloNIDine (CATAPRES) 0.1 MG tablet Take 0.1 mg by mouth 2 (two) times daily.   Yes [provider]  labetalol (NORMODYNE) 200 MG tablet Take 1 tablet (200 mg total) by mouth 2 (two) times daily. 04/02/16  Yes Elgergawy, Silver Huguenin, MD  losartan (COZAAR) 100 MG tablet Take 1 tablet (100 mg total) by mouth every evening. 05/21/16  Yes Verlee Monte, MD  multivitamin (RENA-VIT) TABS tablet Take 1 tablet by mouth at bedtime. 06/09/15  Yes Debbe Odea, MD  ondansetron (ZOFRAN ODT) 8 MG disintegrating tablet Take 1 tablet (8 mg total) by mouth every 8 (eight) hours as needed for nausea or vomiting. 03/24/16  Yes Jola Schmidt, MD  pantoprazole (PROTONIX) 40 MG tablet Take 1 tablet (40 mg total) by mouth 2 (two) times daily. 10/17/16  Yes Levin Erp, PA  PRESCRIPTION MEDICATION Take 1 tablet by mouth 3 (three) times daily with meals. Phosphorus binder from dialysis   Yes [provider]  ranitidine (ZANTAC) 150 MG tablet Take 1 tablet (150 mg total) by mouth 2 (two) times daily. 10/17/16  Yes Levin Erp, PA  Simethicone (GAS-X PO) Take 1 tablet by mouth daily as needed (flatulence/bloatin).   Yes [provider]   Current Facility-Administered Medications  Medication Dose Route Frequency Provider Last Rate Last Dose  . acetaminophen  (TYLENOL) tablet 650 mg  650 mg Oral Q6H PRN Karmen Bongo, MD       Or  . acetaminophen (TYLENOL) suppository 650 mg  650 mg Rectal Q6H PRN Karmen Bongo, MD      . amLODipine (NORVASC) tablet 10 mg  10 mg Oral Daily Karmen Bongo, MD   10 mg at 11/18/16 1036  . cloNIDine (CATAPRES) tablet 0.1 mg  0.1 mg Oral BID Karmen Bongo, MD   0.1 mg at 11/18/16 1037  . docusate sodium (COLACE) capsule 100 mg  100 mg Oral BID Karmen Bongo, MD      . enoxaparin (LOVENOX) injection 30 mg  30 mg Subcutaneous Daily Karmen Bongo, MD      . famotidine (PEPCID) tablet 20 mg  20 mg Oral BID Karmen Bongo, MD   20 mg at 11/18/16 1037  . hydrALAZINE (APRESOLINE) injection 10 mg  10 mg Intravenous Q4H PRN Karmen Bongo, MD   10 mg at 11/18/16 0755  . labetalol (NORMODYNE,TRANDATE) injection 10 mg  10 mg Intravenous Q2H PRN Reubin Milan, MD   10 mg at 11/18/16 0554  . losartan (COZAAR) tablet 100 mg  100 mg Oral QPM Karmen Bongo, MD      . multivitamin (RENA-VIT) tablet 1 tablet  1 tablet Oral Ivery Quale, MD      . ondansetron [facility]) tablet 4 mg  4 mg Oral Q6H PRN Karmen Bongo, MD       Or  . ondansetron [facility]) injection 4 mg  4 mg Intravenous Q6H PRN Karmen Bongo, MD   4 mg at 11/18/16 0914  . [START ON 11/19/2016] pantoprazole (PROTONIX) injection 40 mg  40 mg Intravenous Q12H Karmen Bongo, MD      . simethicone [facility]) chewable tablet 160 mg  160 mg Oral Daily PRN Karmen Bongo, MD      . sodium chloride flush (NS) 0.9 % injection 3 mL  3 mL Intravenous Q12H Karmen Bongo, MD   3 mL at 11/18/16 1038  . sucralfate (CARAFATE) tablet 1 g  1 g Oral TID WC & HS Karmen Bongo, MD   1 g at 11/18/16 0752     ROS: As per HPI otherwise negative.  Physical Exam: Vitals:   11/18/16 0043 11/18/16 0500 11/18/16 0739 11/18/16 0900  BP: (!) 199/123 (!) 198/114 (!) 203/117 (!) 190/99  Pulse: 100 85 82 (!) 102  Resp: 18 18    Temp: 98.4 F (36.9 C) 97.5 F (36.4 C)   98.7 F (37.1 C)  TempSrc: Oral Oral  Oral  SpO2: 99% 100%  98%  Weight: 58.1 kg (128 lb)     Height:         General: Ill-appearing AAF NAD Head: NCAT sclera not icteric MMM Neck: Supple. No JVD No masses Lungs: CTA bilaterally without wheezes, rales, or rhonchi. Breathing is unlabored. Heart: Tachy rhythm S1 S2 Abdomen: soft NT + BS mild epigastric tenderness to palpation  Lower extremities:without edema or ischemic changes, no open wounds  Neuro: A & O  X 3. Moves all extremities spontaneously. Psych:  Responds to questions appropriately with a  normal affect. Dialysis Access: LUE AVF +bruit   Labs: Basic Metabolic Panel:  Recent Labs Lab 11/17/16 1947 11/18/16 0434  NA 135 139  K 4.6 5.2*  CL 94* 97*  CO2 24 27  GLUCOSE 74 78  BUN 31* 42*  CREATININE 11.74* 12.76*  CALCIUM 9.8 9.2   Liver Function Tests:  Recent Labs Lab 11/17/16 1947  AST 21  ALT 13*  ALKPHOS 73  BILITOT 0.8  PROT 7.5  ALBUMIN 4.2    Recent Labs Lab 11/17/16 1947  LIPASE 23   No results for input(s): AMMONIA in the last 168 hours. CBC:  Recent Labs Lab 11/17/16 1947 11/18/16 0434  WBC 10.2 10.0  HGB 12.0 10.1*  HCT 36.8 31.1*  MCV 92.9 93.1  PLT 214 221   Cardiac Enzymes: No results for input(s): CKTOTAL, CKMB, CKMBINDEX, TROPONINI in the last 168 hours. CBG: No results for input(s): GLUCAP in the last 168 hours. Iron Studies: No results for input(s): IRON, TIBC, TRANSFERRIN, FERRITIN in the last 72 hours. Studies/Results: No results found.  Dialysis Orders:  [facility] MWF 3h19mins 180 F BFR 400/800 2K/2.25 Ca EDW 56.5kg  L AVF No Heparin Hectorol 4 mcg IV q HD Venofer 50 mg IV q wk Mircera 50 mcg IV q 2 wks (last 5/21)  BMM Ca acetated 2 q ac   Assessment/Plan: 1.  Abdominal pain/gastritis - per primary not tolerating PO meds IV Protonix now/ Ab CT pending  2.  ESRD -  MWF - For HD today on schedule  3.  Hypertension/volume  - BP^^^ on 4 agents as outpatient  ?compliance/ IV meds now per primary/ volume ok UF to EDW  4.  Anemia  - Hgb stable follow  5.  Metabolic bone disease -   Cont VDRA/ Check P before resuming binder 6.  Nutrition - Albumin 4.2 renal diet/vitamins   Lynnda Child PA-C [place] Pager [phone] 11/18/2016, 11:19 AM  Renal Attending: It is worth seeing how BP is in house on home meds to help determine the need for more/different meds or dry weight changes. I agree with note above. Elivia Robotham C

## 2016-11-18 NOTE — Progress Notes (Signed)
New Admission Note:   Arrival Method:   Via stretcher from the ED Mental Orientation:  A & O x 4 Telemetry:   Placed on Tele Box #[id number] Assessment: Completed Skin:  Dry but intact IV:  See Assessment Pain: See Assessment Tubes:  None Safety Measures: Safety Fall Prevention Plan has been given, discussed and signed Admission: Completed [address] [place] Orientation: Patient has been orientated to the room, unit and staff.  Family:  Fiance at bedside  Orders have been reviewed and implemented. Will continue to monitor the patient. Call light has been placed within reach and bed alarm has been activated.   Earleen Reaper RN- [place], [place] Phone number: [phone]

## 2016-11-18 NOTE — ED Notes (Signed)
Sent text page to Lorin Mercy MD regarding orders for IV BP meds and BP parameters.

## 2016-11-18 NOTE — Progress Notes (Signed)
PROGRESS NOTE  Kerry Cook ZHG:[id number] DOB: [birth year] DOA: 11/17/2016 PCP: [name], FNP   LOS: 0 days   Brief Narrative: Kerry Cook is a 31 y.o. 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  She was hospitalized for this in October 2017, GI was consulted and have EGD on 10/17 which showed nonbleeding prepyloric and pyloric superficial gastric ulcers. CT scan was concerning at that time for partial gastric outlet obstruction.  Assessment & Plan: Principal Problem:   Uncontrolled hypertension Active Problems:   End stage renal disease on dialysis ([place])   Acute gastritis without hemorrhage  Hypertensive urgency -Likely due to not being able to take her p.o. medications due to nausea and vomiting and abdominal pain. -continue home medications with antiemetics, continue hydralazine IV as needed  End-stage renal disease -Nephrology consulted, appreciate input  Intractable nausea vomiting/abdominal pain -Still complaining of intractable pain today, as well as nausea, difficulties to keep anything down -Patient with known peptic ulcer disease and gastritis, continue Protonix IV, clear liquids and advance diet as tolerated, supportive treatment with antiemetics and pain medications -Obtain a CT scan of the abdomen and pelvis   DVT prophylaxis: Lovenox Code Status: Full code Family Communication: No family at bedside Disposition Plan: Home once improved  Consultants:   Nephrology  Procedures:   None   Antimicrobials:  None    Subjective: - no chest pain, shortness of breath, complains of abdominal pain, nausea and vomiting.   Objective: Vitals:   11/18/16 0043 11/18/16 0500 11/18/16 0739 11/18/16 0900  BP: (!) 199/123 (!) 198/114 (!) 203/117 (!) 190/99  Pulse: 100 85 82 (!) 102  Resp: 18 18    Temp: 98.4 F (36.9 C) 97.5  F (36.4 C)  98.7 F (37.1 C)  TempSrc: Oral Oral  Oral  SpO2: 99% 100%  98%  Weight: 58.1 kg (128 lb)     Height:        Intake/Output Summary (Last 24 hours) at 11/18/16 1216 Last data filed at 11/18/16 1038  Gross per 24 hour  Intake                3 ml  Output              101 ml  Net              -98 ml   Filed Weights   11/17/16 1944 11/18/16 0043  Weight: 56.7 kg (125 lb) 58.1 kg (128 lb)    Examination:  Vitals:   11/18/16 0043 11/18/16 0500 11/18/16 0739 11/18/16 0900  BP: (!) 199/123 (!) 198/114 (!) 203/117 (!) 190/99  Pulse: 100 85 82 (!) 102  Resp: 18 18    Temp: 98.4 F (36.9 C) 97.5 F (36.4 C)  98.7 F (37.1 C)  TempSrc: Oral Oral  Oral  SpO2: 99% 100%  98%  Weight: 58.1 kg (128 lb)     Height:        Constitutional: NAD Eyes: lids and conjunctivae normal Respiratory: clear to auscultation bilaterally, no wheezing, no crackles.  Cardiovascular: Regular rate and rhythm, no murmurs / rubs / gallops.  Abdomen: no tenderness. Bowel sounds positive.  Skin: no rashes, lesions, ulcers. No induration Neurologic: CN 2-12 grossly intact. Strength 5/5 in all 4.  Psychiatric: Normal judgment and insight. Alert and oriented x 3. Normal mood.    Data Reviewed: I have  personally reviewed following labs and imaging studies  CBC:  Recent Labs Lab 11/17/16 1947 11/18/16 0434  WBC 10.2 10.0  HGB 12.0 10.1*  HCT 36.8 31.1*  MCV 92.9 93.1  PLT 214 235   Basic Metabolic Panel:  Recent Labs Lab 11/17/16 1947 11/18/16 0434  NA 135 139  K 4.6 5.2*  CL 94* 97*  CO2 24 27  GLUCOSE 74 78  BUN 31* 42*  CREATININE 11.74* 12.76*  CALCIUM 9.8 9.2   GFR: Estimated Creatinine Clearance: 5.1 mL/min (A) (by C-G formula based on SCr of 12.76 mg/dL (H)). Liver Function Tests:  Recent Labs Lab 11/17/16 1947  AST 21  ALT 13*  ALKPHOS 73  BILITOT 0.8  PROT 7.5  ALBUMIN 4.2    Recent Labs Lab 11/17/16 1947  LIPASE 23   No results for input(s):  AMMONIA in the last 168 hours. Coagulation Profile: No results for input(s): INR, PROTIME in the last 168 hours. Cardiac Enzymes: No results for input(s): CKTOTAL, CKMB, CKMBINDEX, TROPONINI in the last 168 hours. BNP (last 3 results) No results for input(s): PROBNP in the last 8760 hours. HbA1C: No results for input(s): HGBA1C in the last 72 hours. CBG: No results for input(s): GLUCAP in the last 168 hours. Lipid Profile: No results for input(s): CHOL, HDL, LDLCALC, TRIG, CHOLHDL, LDLDIRECT in the last 72 hours. Thyroid Function Tests: No results for input(s): TSH, T4TOTAL, FREET4, T3FREE, THYROIDAB in the last 72 hours. Anemia Panel: No results for input(s): VITAMINB12, FOLATE, FERRITIN, TIBC, IRON, RETICCTPCT in the last 72 hours. Urine analysis:    Component Value Date/Time   COLORURINE YELLOW 01/21/2015 1343   APPEARANCEUR HAZY (A) 01/21/2015 1343   LABSPEC 1.009 01/21/2015 1343   PHURINE 8.5 (H) 01/21/2015 1343   GLUCOSEU 100 (A) 01/21/2015 1343   HGBUR SMALL (A) 01/21/2015 1343   BILIRUBINUR NEGATIVE 01/21/2015 1343   KETONESUR 15 (A) 01/21/2015 1343   PROTEINUR >300 (A) 01/21/2015 1343   UROBILINOGEN 0.2 01/21/2015 1343   NITRITE NEGATIVE 01/21/2015 1343   LEUKOCYTESUR NEGATIVE 01/21/2015 1343   Sepsis Labs: Invalid input(s): PROCALCITONIN, LACTICIDVEN  Recent Results (from the past 240 hour(s))  MRSA PCR Screening     Status: None   Collection Time: 11/18/16  5:51 AM  Result Value Ref Range Status   MRSA by PCR NEGATIVE NEGATIVE Final    Comment:        The GeneXpert MRSA Assay (FDA approved for NASAL specimens only), is one component of a comprehensive MRSA colonization surveillance program. It is not intended to diagnose MRSA infection nor to guide or monitor treatment for MRSA infections.       Radiology Studies: No results found.   Scheduled Meds: . amLODipine  10 mg Oral Daily  . cloNIDine  0.1 mg Oral BID  . docusate sodium  100 mg Oral BID   . doxercalciferol  4 mcg Intravenous Q M,W,F-HD  . enoxaparin (LOVENOX) injection  30 mg Subcutaneous Daily  . famotidine  20 mg Oral BID  . losartan  100 mg Oral QPM  . multivitamin  1 tablet Oral QHS  . [START ON 11/19/2016] pantoprazole (PROTONIX) IV  40 mg Intravenous Q12H  . sodium chloride flush  3 mL Intravenous Q12H  . sucralfate  1 g Oral TID WC & HS   Continuous Infusions:   Marzetta Board, MD, PhD [facility] Pager [phone]  If 7PM-7AM, please contact night-coverage [email or web address] Password [email or web address] 11/18/2016, 12:16 PM

## 2016-11-18 NOTE — Progress Notes (Signed)
Upon arrival to the floor, the patient's blood pressure was 195/121.  She was complaining of persistent nausea and severe itching.  She is unable to take the scheduled PO clonidine and labetalol.  MD called and made aware.  Received order for and administered 25 mg IV Benadryl, 10 mg IV labetalol, and 4 mg IV Zofran.  Will continue to monitor patient.  Earleen Reaper RN-BC, [employer]

## 2016-11-18 NOTE — Plan of Care (Signed)
Problem: Physical Regulation: Goal: Ability to maintain clinical measurements within normal limits will improve Outcome: Not Progressing Patient experiencing severe abdominal pain within an hour of taking sucralafate. MD notified. Ondansetron and hydromorphone given per orders. 100 mL light green emesis. Patient unable to take PO meds at this time. Will continue to monitor. Bartholomew Crews, RN

## 2016-11-18 NOTE — Care Management Obs Status (Signed)
[facility] NOTIFICATION   Patient Details  Name: Kerry Cook MRN: [id number] Date of Birth: 22-May-1986   Medicare Observation Status Notification Given:  Yes    Akari Crysler, Rory Percy, RN 11/18/2016, 1:16 PM

## 2016-11-19 DIAGNOSIS — Z992 Dependence on renal dialysis: Secondary | ICD-10-CM

## 2016-11-19 DIAGNOSIS — R1013 Epigastric pain: Secondary | ICD-10-CM

## 2016-11-19 DIAGNOSIS — N186 End stage renal disease: Secondary | ICD-10-CM

## 2016-11-19 DIAGNOSIS — R188 Other ascites: Secondary | ICD-10-CM

## 2016-11-19 DIAGNOSIS — R110 Nausea: Secondary | ICD-10-CM

## 2016-11-19 LAB — CBC
HCT: 32.1 % — ABNORMAL LOW (ref 36.0–46.0)
Hemoglobin: 10.2 g/dL — ABNORMAL LOW (ref 12.0–15.0)
MCH: 29.8 pg (ref 26.0–34.0)
MCHC: 31.8 g/dL (ref 30.0–36.0)
MCV: 93.9 fL (ref 78.0–100.0)
PLATELETS: 182 10*3/uL (ref 150–400)
RBC: 3.42 MIL/uL — ABNORMAL LOW (ref 3.87–5.11)
RDW: 17.6 % — AB (ref 11.5–15.5)
WBC: 8.7 10*3/uL (ref 4.0–10.5)

## 2016-11-19 LAB — COMPREHENSIVE METABOLIC PANEL
ALT: 9 U/L — AB (ref 14–54)
AST: 16 U/L (ref 15–41)
Albumin: 3.4 g/dL — ABNORMAL LOW (ref 3.5–5.0)
Alkaline Phosphatase: 51 U/L (ref 38–126)
Anion gap: 15 (ref 5–15)
BILIRUBIN TOTAL: 0.5 mg/dL (ref 0.3–1.2)
BUN: 32 mg/dL — AB (ref 6–20)
CHLORIDE: 95 mmol/L — AB (ref 101–111)
CO2: 25 mmol/L (ref 22–32)
CREATININE: 10.86 mg/dL — AB (ref 0.44–1.00)
Calcium: 9.2 mg/dL (ref 8.9–10.3)
GFR calc Af Amer: 5 mL/min — ABNORMAL LOW (ref >60)
GFR, EST NON AFRICAN AMERICAN: 4 mL/min — AB (ref >60)
Glucose, Bld: 68 mg/dL (ref 65–99)
Potassium: 4.5 mmol/L (ref 3.5–5.1)
Sodium: 135 mmol/L (ref 135–145)
Total Protein: 6.1 g/dL — ABNORMAL LOW (ref 6.5–8.1)

## 2016-11-19 MED ORDER — CLONIDINE HCL 0.2 MG PO TABS
0.2000 mg | ORAL_TABLET | Freq: Three times a day (TID) | ORAL | Status: DC
  Administered 2016-11-19 – 2016-11-21 (×5): 0.2 mg via ORAL
  Filled 2016-11-19 (×5): qty 1

## 2016-11-19 MED ORDER — ENOXAPARIN SODIUM 30 MG/0.3ML ~~LOC~~ SOLN
30.0000 mg | Freq: Every day | SUBCUTANEOUS | Status: DC
  Filled 2016-11-19: qty 0.3

## 2016-11-19 MED ORDER — OXYCODONE-ACETAMINOPHEN 5-325 MG PO TABS
1.0000 | ORAL_TABLET | Freq: Four times a day (QID) | ORAL | Status: DC | PRN
  Administered 2016-11-19 (×2): 1 via ORAL
  Filled 2016-11-19 (×3): qty 1

## 2016-11-19 NOTE — Consult Note (Signed)
[facility] Consult: 8:44 AM 11/19/2016  LOS: 1 day    Referring Provider: Dr Eliseo Squires  Primary Care Physician:  [name], FNP Primary Gastroenterologist:  Dr. Carlean Purl    Reason for Consultation:  Nausea, vomiting   HPI: Kerry Cook is a 31 y.o. female.  PMH ESRD (due to htn), [place] dialysis.  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   Hyperparathyroidism.  Htn.  Anemia requiring transfusion 2016.  03/2016 EGD for N/V.  Showed non-bleeding pre-pyloric and pyloric ulcers.  Pathology: benign SB, reactive gastropathy, mild inflammation.  No H Pylori.  No dysplasia etc.   CT scans x 2 in 03/2016 showed thickening, inflammation in prox SB and adjacent mesentery, more pronounced on CT #2.  Gastric distention and pyloric irregularity likely represent at least partial GOO. Protonix decreased from BID to q day in 06/2016 at GI fup.  Subsequent visits to ED with ongoing epigastric pain, hypertension.  Protonix increased back to BID.  Attacks of pain seem to be related to specific foods.  4/26 visit with GI PA-C [facility] for epigastric pain.  Zantac 150 mg added to BID Protonix.  Consider EGD for recalcitrant issues.   Doing well Sunday AM. 5/27.  ~ 4 PM, developed recurrent 10/10 epigastric pain.  Some nausea, no emesis.  At lunch she had eaten chicken, which normally causes no problems. She does say that when she eats red meat, it tends to flareup her symptoms.  Readmitted 5/27 for eval.  So far at the hospital she hasn't vomited.  The pain is well-controlled with either IV Dilaudid or Percocet.  Symptoms worse after using Carafate CT scan ab/pelvis: duodenal and jejunal wall thickening, ileum spared.  Changes more widespread than 03/2016.  ? Post transplant lymphoproliferative d/o vs infectious, inflammatory,  and vascular etiologies.  Diffuse ascites, increased subq fat attenuation c/w volume overload.  Mild atherosclerosis. Current and recent LFTs, Lipase consistently normal.    Hgb 12 >> 10.2, baseline is 11 to 12.   HCG slightly elevated at 11.2, 3 on repeat exam.  (normal is <5)  Troponins slightly elevated  Hypertension persists.  180s -202/99-140s in last 24 hours.  She attributes the rise in her blood pressure to increased abdominal pain.  Pain does not radiate beyond the epigastrium. Nausea is not a common feature but when the pain is bad she will get nauseated. She does not vomit often at all. Normally having daily bowel movements. Currently tolerating clear liquid diet.      Past Medical History:  Diagnosis Date  . Chronic kidney disease    previous hx dialysis  . Dialysis patient ([place])   . History of kidney transplant 2012   kidney failure due to hypertension  . Hypertension   . Peptic ulcer   . Renal insufficiency     Past Surgical History:  Procedure Laterality Date  . ESOPHAGOGASTRODUODENOSCOPY N/A 04/01/2016   Procedure: ESOPHAGOGASTRODUODENOSCOPY (EGD);  Surgeon: Gatha Mayer, MD;  [facility] END[facility]COPY;  Service: Endoscopy;  Laterality: N/A;  . KIDNEY TRANSPLANT Bilateral 2012  Prior to Admission medications   Medication Sig Start Date End Date Taking? Authorizing Provider  acetaminophen (TYLENOL) 325 MG tablet Take 650 mg by mouth every 6 (six) hours as needed for headache (pain).   Yes [provider]  amLODipine (NORVASC) 10 MG tablet Take 1 tablet (10 mg total) by mouth every evening. Patient taking differently: Take 10 mg by mouth daily.  09/13/15  Yes [name], MD  calcium acetate, Phos Binder, (PHOSLYRA) 667 MG/5ML SOLN Take 1,334 mg by mouth 3 (three) times daily with meals.   Yes [provider]  cloNIDine (CATAPRES) 0.1 MG tablet Take 0.1 mg by mouth 2 (two) times daily.   Yes [provider]  labetalol (NORMODYNE) 200  MG tablet Take 1 tablet (200 mg total) by mouth 2 (two) times daily. 04/02/16  Yes Elgergawy, Silver Huguenin, MD  losartan (COZAAR) 100 MG tablet Take 1 tablet (100 mg total) by mouth every evening. 05/21/16  Yes Verlee Monte, MD  multivitamin (RENA-VIT) TABS tablet Take 1 tablet by mouth at bedtime. 06/09/15  Yes Debbe Odea, MD  ondansetron (ZOFRAN ODT) 8 MG disintegrating tablet Take 1 tablet (8 mg total) by mouth every 8 (eight) hours as needed for nausea or vomiting. 03/24/16  Yes Jola Schmidt, MD  pantoprazole (PROTONIX) 40 MG tablet Take 1 tablet (40 mg total) by mouth 2 (two) times daily. 10/17/16  Yes Levin Erp, PA  ranitidine (ZANTAC) 150 MG tablet Take 1 tablet (150 mg total) by mouth 2 (two) times daily. 10/17/16  Yes Levin Erp, PA  Simethicone (GAS-X PO) Take 1 tablet by mouth daily as needed (flatulence/bloatin).   Yes [provider]    Scheduled Meds: . amLODipine  10 mg Oral Daily  . cloNIDine  0.1 mg Oral BID  . docusate sodium  100 mg Oral BID  . doxercalciferol  4 mcg Intravenous Q M,W,F-HD  . enoxaparin (LOVENOX) injection  30 mg Subcutaneous Daily  . famotidine  20 mg Oral BID  . losartan  100 mg Oral QPM  . multivitamin  1 tablet Oral QHS  . pantoprazole (PROTONIX) IV  40 mg Intravenous Q12H  . sodium chloride flush  3 mL Intravenous Q12H  . sucralfate  1 g Oral TID WC & HS   Infusions:  PRN Meds: acetaminophen **OR** acetaminophen, camphor-menthol, hydrALAZINE, labetalol, ondansetron **OR** ondansetron (ZOFRAN) IV, oxyCODONE-acetaminophen, simethicone   Allergies as of 11/17/2016  . (No Known Allergies)    Family History  Problem Relation Age of Onset  . Kidney disease Father   . Lupus Maternal Grandmother   . Cancer Maternal Grandfather   . Colon cancer Neg Hx   . Stomach cancer Neg Hx   . Rectal cancer Neg Hx   . Esophageal cancer Neg Hx   . Liver cancer Neg Hx     Social History   Social History  . Marital status:  Single    Spouse name: N/A  . Number of children: 0  . Years of education: N/A   Occupational History  . Daycare    Social History Main Topics  . Smoking status: Never Smoker  . Smokeless tobacco: Never Used  . Alcohol use No  . Drug use: No  . Sexual activity: Yes    Partners: Male    Birth control/ protection: Condom, None   Other Topics Concern  . Not on file   Social History Narrative  . No narrative on file    REVIEW OF SYSTEMS: Constitutional:  No increased weakness or fatigue. ENT:  No nose bleeds.  No congestion Pulm:  No trouble breathing. No cough. CV:  No palpitations, no LE edema.   No chest pain.  No problems with vascular access for her dialysis. GU:  Anuric GI:  Per HPI Heme:  No unusual bleeding or bruising.   Transfusions:  No transfusions since 2016. Neuro:  Occasional headache. no peripheral tingling or numbness Derm:  In general, for her, narcotics cause pruritus which she is willing to live with because they do control the abdominal pain. Endocrine:  No sweats or chills.  No polyuria or dysuria Immunization:  Did not inquire. Travel:  None beyond local counties in last few months.    PHYSICAL EXAM: Vital signs in last 24 hours: Vitals:   11/19/16 0812 11/19/16 0836  BP: (!) 192/114 (!) 180/112  Pulse: 88 84  Resp: (!) 22   Temp: 98.6 F (37 C)    Wt Readings from Last 3 Encounters:  11/18/16 57.3 kg (126 lb 5.2 oz)  11/08/16 59 kg (130 lb)  10/17/16 59 kg (130 lb)    General: Pleasant, comfortable. Non-ill appearing AAF. Fully awake and alert. Head:  No signs of head trauma. No facial asymmetry or swelling.  Eyes:  No scleral icterus, no conjunctival pallor. Ears:  Not hard of hearing  Nose:  No congestion, no discharge. Mouth:  Moist, clear, oral mucosa. Good dentition. Neck:  No JVD, no masses, no thyromegaly. Lungs:  Clear bilaterally. No cough. No labored breathing. Heart: RRR. No MRG. S1, S2 present. Abdomen:  Soft. Not  tender, not distended. Bowel sounds active. No organomegaly, hernias, bruits.. Rectal: Deferred   Musc/Skeltl: No joint swelling or gross deformities. Extremities:  No CCE. Thrill palpable in hemodialysis graft in lower left arm.  Neurologic:  Alert. Calm. Oriented times 3. Moves all 4 limbs, no weakness. No tremor. No asterixis. Skin:  No sores, rash, suspicious lesions. Tattoos:  Tattoos on the arms bilaterally. Nodes:  No cervical adenopathy.   Psych:  Pleasant, calm.  Intake/Output from previous day: 05/28 0701 - 05/29 0700 In: 643 [P.O.:640; I.V.:3] Out: 2095 [Emesis/NG output:100; Stool:1] Intake/Output this shift: No intake/output data recorded.  LAB RESULTS:  Recent Labs  11/18/16 0434 11/18/16 1500 11/19/16 0712  WBC 10.0 11.0* 8.7  HGB 10.1* 10.9* 10.2*  HCT 31.1* 33.2* 32.1*  PLT 221 213 182   BMET Lab Results  Component Value Date   NA 135 11/19/2016   NA 137 11/18/2016   NA 139 11/18/2016   K 4.5 11/19/2016   K 5.3 (H) 11/18/2016   K 5.2 (H) 11/18/2016   CL 95 (L) 11/19/2016   CL 96 (L) 11/18/2016   CL 97 (L) 11/18/2016   CO2 25 11/19/2016   CO2 24 11/18/2016   CO2 27 11/18/2016   GLUCOSE 68 11/19/2016   GLUCOSE 80 11/18/2016   GLUCOSE 78 11/18/2016   BUN 32 (H) 11/19/2016   BUN 46 (H) 11/18/2016   BUN 42 (H) 11/18/2016   CREATININE 10.86 (H) 11/19/2016   CREATININE 12.97 (H) 11/18/2016   CREATININE 12.76 (H) 11/18/2016   CALCIUM 9.2 11/19/2016   CALCIUM 9.2 11/18/2016   CALCIUM 9.2 11/18/2016   LFT  Recent Labs  11/17/16 1947 11/18/16 1500 11/19/16 0712  PROT 7.5  --  6.1*  ALBUMIN 4.2 3.5 3.4*  AST 21  --  16  ALT 13*  --  9*  ALKPHOS 73  --  51  BILITOT 0.8  --  0.5   PT/INR Lab Results  Component Value Date   INR 1.0 09/03/2007   INR 1.0 09/02/2007   INR 1.0 08/26/2007   Hepatitis Panel No results for input(s): HEPBSAG, HCVAB, HEPAIGM, HEPBIGM in the last 72 hours. C-Diff No components found for: CDIFF Lipase       Component Value Date/Time   LIPASE 23 11/17/2016 1947    Drugs of Abuse     Component Value Date/Time   LABOPIA NEG 06/02/2014 1103   COCAINSCRNUR NEG 06/02/2014 1103   LABBENZ NEG 06/02/2014 1103   LABBENZ NEGATIVE 08/26/2007 0820   AMPHETMU NEG 06/02/2014 1103   THCU NEG 06/02/2014 1103   LABBARB NEG 06/02/2014 1103     RADIOLOGY STUDIES: Ct Abdomen Pelvis W Contrast  Result Date: 11/18/2016 CLINICAL DATA:  Abdominal pain with nausea and vomiting since yesterday. EXAM: CT ABDOMEN AND PELVIS WITH CONTRAST TECHNIQUE: Multidetector CT imaging of the abdomen and pelvis was performed using the standard protocol following bolus administration of intravenous contrast. CONTRAST:  36mL ISOVUE-300 IOPAMIDOL (ISOVUE-300) INJECTION 61% COMPARISON:  CT scans June 08, 2015, March 24, 2016, and March 31, 2016 FINDINGS: Lower chest: Stable cardiomegaly.  Lung bases are otherwise normal. Hepatobiliary: No focal liver abnormality is seen. No gallstones, gallbladder wall thickening, or biliary dilatation. Pancreas: Unremarkable. No pancreatic ductal dilatation or surrounding inflammatory changes. Spleen: Normal in size without focal abnormality. Adrenals/Urinary Tract: The adrenal glands are normal. The native kidneys are atrophic consistent with previous renal transplants. The 2 transplanted kidneys in the right pelvis remain calcified and atrophic. Stomach/Bowel: The stomach is mildly distended. There is bowel wall thickening associated with the duodenum and the jejunum. There is relative sparing of the ileum. No pneumatosis in the thickened bowel loops. No evidence of bowel obstruction. The colon is unremarkable. The appendix is not well visualized but there is no evidence of appendicitis. Vascular/Lymphatic: Mild atherosclerotic change in the non aneurysmal aorta. No aneurysm or dissection. No adenopathy. Reproductive: Uterus and bilateral adnexa are unremarkable. Other: Diffuse ascites. No free air.  Mild increased attenuation in the subcutaneous fat consistent with volume overload. There is a periumbilical fat containing hernia. Musculoskeletal: No acute or significant osseous findings. IMPRESSION: 1. Wall thickening associated with the duodenum and jejunum with relative sparing of the ileum. Given history of a previous kidney transplant, this could represent sequela of post transplant lymphoproliferative disorder. However, infectious, inflammatory, and vascular etiologies could demonstrate the same appearance. Recommend clinical correlation. The findings on today's study are more diffuse than on the October 2017 studies. 2. Calcified atrophic transplant kidneys in the right pelvis. 3. Diffuse ascites. Increased attenuation in the subcutaneous fat consist with volume overload. 4. Mild atherosclerosis. 5. No other acute abnormalities. Electronically Signed   By: Dorise Bullion III M.D   On: 11/18/2016 13:38     IMPRESSION:   *  Epigastric pain, nausea without vomiting.  Latest CT with ongoing, more widespread SB ( doudenal and Jejunal) thickening.  EGD in 03/2016 with gastritis, duodenitis.  Sxs persist despite BID PPI and HS H2 blocker.  Suspect the duodenitis, jejunitis is not acid peptic in origin. Patient's symptoms have failed to respond to maximum dose PPI, addition of at bedtime H2 blocker and symptoms aggravated by addition of Carafate.  *  ESRD, MWF HD.  bil renal transplant 2012, failed in 2015.    *  Hypertension.    *  Normocytic anemia.  No transfusions since 2016.    PLAN:     *  Stop the Carafate. She feels that this is worsening her problems and it is certainly not helping.  Also stopped the twice daily famotidine, it is not helping.  *  Enteroscopy tomorrow.      Azucena Freed  11/19/2016, 8:44 AM Pager: [phone]   I have reviewed the entire case in detail with the above APP and discussed the plan in detail.  Therefore, I agree with the diagnoses recorded above. In  addition,  I have personally interviewed and examined the patient and have personally reviewed any abdominal/pelvic CT scan images.  My additional thoughts are as follows:  Her chronic abd pain is somewhat vague, an it sounds as if she has intermittent N/V.  She has a large volume of ascites from ESRD.  I think this is giving the appearance of SB thickening/inflammation, but I doubt there is real mucosal disease.  We will see with an enteroscopy tomorrow.  If nothing, then I do not feel we can do any more for her pain or ascites, and anti-emetics are probably needed long term.    [name] Pager [phone]  Mon-Fri 8a-5p [phone] after 5p, weekends, holidays

## 2016-11-19 NOTE — Progress Notes (Signed)
PROGRESS NOTE  Kerry Cook FGH:[id number] DOB: [birth year] DOA: 11/17/2016 PCP: [name], FNP   LOS: 1 day   Brief Narrative: Kerry Cook is a 31 y.o. 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  She was hospitalized for this in October 2017, GI was consulted and have EGD on 10/17 which showed nonbleeding prepyloric and pyloric superficial gastric ulcers. CT scan was concerning at that time for partial gastric outlet obstruction.  Assessment & Plan: Principal Problem:   Uncontrolled hypertension Active Problems:   End stage renal disease on dialysis ([facility])   Acute gastritis without hemorrhage  Hypertensive urgency -Likely due to not being able to take her p.o. medications due to nausea and vomiting and abdominal pain. -continue home medications with antiemetics, continue hydralazine IV as needed  End-stage renal disease -Nephrology consulted HD MWF  Intractable nausea vomiting/abdominal pain -tolerating some PO-- GI consult and EGD planned for AM -Patient with known peptic ulcer disease and gastritis, continue Protonix IV   DVT prophylaxis: Lovenox Code Status: Full code Family Communication: No family at bedside Disposition Plan: Home once improved  Consultants:   Nephrology  GI  Procedures:  EGD 5/30   Subjective: Tolerating some PO without nausea but still having pain  Objective: Vitals:   11/19/16 0534 11/19/16 0812 11/19/16 0836 11/19/16 0930  BP: (!) 177/115 (!) 192/114 (!) 180/112 (!) 177/122  Pulse: 86 88 84 74  Resp: 17 (!) 22  20  Temp: 98 F (36.7 C) 98.6 F (37 C)  98.4 F (36.9 C)  TempSrc: Oral Oral  Oral  SpO2: 100% 98%  100%  Weight:      Height:        Intake/Output Summary (Last 24 hours) at 11/19/16 1201 Last data filed at 11/19/16 0932  Gross per 24 hour  Intake              760 ml  Output              1994 ml  Net            -1234 ml   Filed Weights   11/18/16 1445 11/18/16 1855 11/18/16 2200  Weight: 60.8 kg (134 lb 0.6 oz) 55.8 kg (123 lb 0.3 oz) 57.3 kg (126 lb 5.2 oz)    Examination:  Vitals:   11/19/16 0534 11/19/16 0812 11/19/16 0836 11/19/16 0930  BP: (!) 177/115 (!) 192/114 (!) 180/112 (!) 177/122  Pulse: 86 88 84 74  Resp: 17 (!) 22  20  Temp: 98 F (36.7 C) 98.6 F (37 C)  98.4 F (36.9 C)  TempSrc: Oral Oral  Oral  SpO2: 100% 98%  100%  Weight:      Height:        Constitutional: NAD Respiratory: clear Cardiovascular: Regular rate and rhythm, no murmurs / rubs / gallops.  Abdomen: +BS, soft, epigastric tenderness Skin: no rashes, lesions, ulcers. No induration Neurologic: CN 2-12 grossly intact. Strength 5/5 in all 4.     Data Reviewed: I have personally reviewed following labs and imaging studies  CBC:  Recent Labs Lab 11/17/16 1947 11/18/16 0434 11/18/16 1500 11/19/16 0712  WBC 10.2 10.0 11.0* 8.7  HGB 12.0 10.1* 10.9* 10.2*  HCT 36.8 31.1* 33.2* 32.1*  MCV 92.9 93.1 91.7 93.9  PLT 214 221 213 678   Basic Metabolic Panel:  Recent Labs Lab 11/17/16 1947 11/18/16 0434  11/18/16 1141 11/18/16 1500 11/19/16 0712  NA 135 139  --  137 135  K 4.6 5.2*  --  5.3* 4.5  CL 94* 97*  --  96* 95*  CO2 24 27  --  24 25  GLUCOSE 74 78  --  80 68  BUN 31* 42*  --  46* 32*  CREATININE 11.74* 12.76*  --  12.97* 10.86*  CALCIUM 9.8 9.2  --  9.2 9.2  PHOS  --   --  7.5* 6.0*  --    GFR: Estimated Creatinine Clearance: 6 mL/min (A) (by C-G formula based on SCr of 10.86 mg/dL (H)). Liver Function Tests:  Recent Labs Lab 11/17/16 1947 11/18/16 1500 11/19/16 0712  AST 21  --  16  ALT 13*  --  9*  ALKPHOS 73  --  51  BILITOT 0.8  --  0.5  PROT 7.5  --  6.1*  ALBUMIN 4.2 3.5 3.4*    Recent Labs Lab 11/17/16 1947  LIPASE 23   No results for input(s): AMMONIA in the last 168 hours. Coagulation Profile: No results for input(s):  INR, PROTIME in the last 168 hours. Cardiac Enzymes: No results for input(s): CKTOTAL, CKMB, CKMBINDEX, TROPONINI in the last 168 hours. BNP (last 3 results) No results for input(s): PROBNP in the last 8760 hours. HbA1C: No results for input(s): HGBA1C in the last 72 hours. CBG: No results for input(s): GLUCAP in the last 168 hours. Lipid Profile: No results for input(s): CHOL, HDL, LDLCALC, TRIG, CHOLHDL, LDLDIRECT in the last 72 hours. Thyroid Function Tests: No results for input(s): TSH, T4TOTAL, FREET4, T3FREE, THYROIDAB in the last 72 hours. Anemia Panel: No results for input(s): VITAMINB12, FOLATE, FERRITIN, TIBC, IRON, RETICCTPCT in the last 72 hours. Urine analysis:    Component Value Date/Time   COLORURINE YELLOW 01/21/2015 1343   APPEARANCEUR HAZY (A) 01/21/2015 1343   LABSPEC 1.009 01/21/2015 1343   PHURINE 8.5 (H) 01/21/2015 1343   GLUCOSEU 100 (A) 01/21/2015 1343   HGBUR SMALL (A) 01/21/2015 1343   BILIRUBINUR NEGATIVE 01/21/2015 1343   KETONESUR 15 (A) 01/21/2015 1343   PROTEINUR >300 (A) 01/21/2015 1343   UROBILINOGEN 0.2 01/21/2015 1343   NITRITE NEGATIVE 01/21/2015 1343   LEUKOCYTESUR NEGATIVE 01/21/2015 1343   Sepsis Labs: Invalid input(s): PROCALCITONIN, LACTICIDVEN  Recent Results (from the past 240 hour(s))  MRSA PCR Screening     Status: None   Collection Time: 11/18/16  5:51 AM  Result Value Ref Range Status   MRSA by PCR NEGATIVE NEGATIVE Final    Comment:        The GeneXpert MRSA Assay (FDA approved for NASAL specimens only), is one component of a comprehensive MRSA colonization surveillance program. It is not intended to diagnose MRSA infection nor to guide or monitor treatment for MRSA infections.       Radiology Studies: Ct Abdomen Pelvis W Contrast  Result Date: 11/18/2016 CLINICAL DATA:  Abdominal pain with nausea and vomiting since yesterday. EXAM: CT ABDOMEN AND PELVIS WITH CONTRAST TECHNIQUE: Multidetector CT imaging of the  abdomen and pelvis was performed using the standard protocol following bolus administration of intravenous contrast. CONTRAST:  29mL ISOVUE-300 IOPAMIDOL (ISOVUE-300) INJECTION 61% COMPARISON:  CT scans June 08, 2015, March 24, 2016, and March 31, 2016 FINDINGS: Lower chest: Stable cardiomegaly.  Lung bases are otherwise normal. Hepatobiliary: No focal liver abnormality is seen. No gallstones, gallbladder wall thickening, or biliary dilatation. Pancreas: Unremarkable. No pancreatic ductal dilatation or surrounding inflammatory changes. Spleen:  Normal in size without focal abnormality. Adrenals/Urinary Tract: The adrenal glands are normal. The native kidneys are atrophic consistent with previous renal transplants. The 2 transplanted kidneys in the right pelvis remain calcified and atrophic. Stomach/Bowel: The stomach is mildly distended. There is bowel wall thickening associated with the duodenum and the jejunum. There is relative sparing of the ileum. No pneumatosis in the thickened bowel loops. No evidence of bowel obstruction. The colon is unremarkable. The appendix is not well visualized but there is no evidence of appendicitis. Vascular/Lymphatic: Mild atherosclerotic change in the non aneurysmal aorta. No aneurysm or dissection. No adenopathy. Reproductive: Uterus and bilateral adnexa are unremarkable. Other: Diffuse ascites. No free air. Mild increased attenuation in the subcutaneous fat consistent with volume overload. There is a periumbilical fat containing hernia. Musculoskeletal: No acute or significant osseous findings. IMPRESSION: 1. Wall thickening associated with the duodenum and jejunum with relative sparing of the ileum. Given history of a previous kidney transplant, this could represent sequela of post transplant lymphoproliferative disorder. However, infectious, inflammatory, and vascular etiologies could demonstrate the same appearance. Recommend clinical correlation. The findings on today's  study are more diffuse than on the October 2017 studies. 2. Calcified atrophic transplant kidneys in the right pelvis. 3. Diffuse ascites. Increased attenuation in the subcutaneous fat consist with volume overload. 4. Mild atherosclerosis. 5. No other acute abnormalities. Electronically Signed   By: Dorise Bullion III M.D   On: 11/18/2016 13:38     Scheduled Meds: . amLODipine  10 mg Oral Daily  . cloNIDine  0.1 mg Oral BID  . docusate sodium  100 mg Oral BID  . doxercalciferol  4 mcg Intravenous Q M,W,F-HD  . enoxaparin (LOVENOX) injection  30 mg Subcutaneous Daily  . losartan  100 mg Oral QPM  . multivitamin  1 tablet Oral QHS  . pantoprazole (PROTONIX) IV  40 mg Intravenous Q12H  . sodium chloride flush  3 mL Intravenous Q12H   Continuous Infusions:   Kerry Cook [facility] Pager [phone]  If 7PM-7AM, please contact night-coverage [email or web address] Password [email or web address] 11/19/2016, 12:01 PM

## 2016-11-19 NOTE — Progress Notes (Signed)
[facility] Progress Note  Interval History: 31 y.o. female with ESRD secondary to HTN, h/o failed renal transplant, gastric ulcer, PUD. Presented to [facility] ED with abdominal pain and vomiting. ED course also significant for markedly elevated BP. EGD 03/2016 with non-bleeding pre-pyloric and pyloric superficial gastric ulcers with benign pathology Patient with frequent visits to ED secondary to abdominal pain/gastritis with elevated BP. Noted some concern about compliance with outpatient medications.   Dialysis Orders:  [facility] MWF 3h71mins 180 F BFR 400/800 2K/2.25 Ca EDW 56.5kg  L AVF No Heparin Hectorol 4 mcg IV q HD Venofer 50 mg IV q wk Mircera 50 mcg IV q 2 wks (last 5/21)  BMM Ca acetated 2 q ac    Assessment/Plan: 1.  Abdominal pain/gastritis - Abd CT with wall thickening associated with the duodenum and jejunum -GI following not responding well to PPIs - planning enteroscopy/ Also suggestion of vascular, inflammatory etiology?  2.  ESRD -  MWF - Cont on schedule  3.  Hypertension/volume  - BP remains elevated on po home meds/IV labetalol - ascites seen on abd CT  suggesting vol overload  Post HD wt yesterday 55.8 kg so got below EDW if weights accurate / Challenge EDW next HD 4.  Anemia  - Hgb stable follow  5.  Metabolic bone disease -   Cont VDRA/ Check P before resuming binder 6.  Nutrition - Albumin 4.2 renal diet/vitamins    Subjective:  Severe episode of abdominal pain prior to HD yesterday. Resolved with percocet. No abdominal pain currently. Denies N/V. Tolerating CL diet   Objective Vitals:   11/19/16 0534 11/19/16 0812 11/19/16 0836 11/19/16 0930  BP: (!) 177/115 (!) 192/114 (!) 180/112 (!) 177/122  Pulse: 86 88 84 74  Resp: 17 (!) 22  20  Temp: 98 F (36.7 C) 98.6 F (37 C)  98.4 F (36.9 C)  TempSrc: Oral Oral  Oral  SpO2: 100% 98%  100%  Weight:      Height:       Physical Exam General: WNWD AAF NAD Heart: RRR Lungs: CTAB Abdomen: bs+  soft NT/ND Extremities: no LE edema  Dialysis Access: LUE AVF +bruit    Lynnda Child PA-C [facility] Pager [phone] 11/19/2016,10:49 AM  LOS: 1 day   Additional Objective Labs: Basic Metabolic Panel:  Recent Labs Lab 11/18/16 0434 11/18/16 1141 11/18/16 1500 11/19/16 0712  NA 139  --  137 135  K 5.2*  --  5.3* 4.5  CL 97*  --  96* 95*  CO2 27  --  24 25  GLUCOSE 78  --  80 68  BUN 42*  --  46* 32*  CREATININE 12.76*  --  12.97* 10.86*  CALCIUM 9.2  --  9.2 9.2  PHOS  --  7.5* 6.0*  --    Liver Function Tests:  Recent Labs Lab 11/17/16 1947 11/18/16 1500 11/19/16 0712  AST 21  --  16  ALT 13*  --  9*  ALKPHOS 73  --  51  BILITOT 0.8  --  0.5  PROT 7.5  --  6.1*  ALBUMIN 4.2 3.5 3.4*    Recent Labs Lab 11/17/16 1947  LIPASE 23   CBC:  Recent Labs Lab 11/17/16 1947 11/18/16 0434 11/18/16 1500 11/19/16 0712  WBC 10.2 10.0 11.0* 8.7  HGB 12.0 10.1* 10.9* 10.2*  HCT 36.8 31.1* 33.2* 32.1*  MCV 92.9 93.1 91.7 93.9  PLT 214 221 213 182   Blood  Culture    Component Value Date/Time   SDES URINE, CLEAN CATCH 06/26/2014 2035   SPECREQUEST None 06/26/2014 2035   CULT  06/26/2014 2035    LACTOBACILLUS SPECIES Note: Standardized susceptibility testing for this organism is not available. Performed at [facility] 06/28/2014 FINAL 06/26/2014 2035    Cardiac Enzymes: No results for input(s): CKTOTAL, CKMB, CKMBINDEX, TROPONINI in the last 168 hours. CBG: No results for input(s): GLUCAP in the last 168 hours. Iron Studies: No results for input(s): IRON, TIBC, TRANSFERRIN, FERRITIN in the last 72 hours. Lab Results  Component Value Date   INR 1.0 09/03/2007   INR 1.0 09/02/2007   INR 1.0 08/26/2007   Medications:  . amLODipine  10 mg Oral Daily  . cloNIDine  0.1 mg Oral BID  . docusate sodium  100 mg Oral BID  . doxercalciferol  4 mcg Intravenous Q M,W,F-HD  . enoxaparin (LOVENOX) injection  30 mg  Subcutaneous Daily  . famotidine  20 mg Oral BID  . losartan  100 mg Oral QPM  . multivitamin  1 tablet Oral QHS  . pantoprazole (PROTONIX) IV  40 mg Intravenous Q12H  . sodium chloride flush  3 mL Intravenous Q12H  . sucralfate  1 g Oral TID WC & HS    Renal Attend: I agree with note above.  I will increase clonidine to optimize  BP control Reginald Mangels C

## 2016-11-20 ENCOUNTER — Inpatient Hospital Stay (HOSPITAL_COMMUNITY): Payer: Medicare Other | Admitting: Anesthesiology

## 2016-11-20 ENCOUNTER — Encounter (HOSPITAL_COMMUNITY): Payer: Self-pay | Admitting: *Deleted

## 2016-11-20 ENCOUNTER — Encounter (HOSPITAL_COMMUNITY)
Admission: EM | Disposition: A | Discharge status: Home / Self Care | Payer: Self-pay | Source: Home / Self Care | Attending: Internal Medicine

## 2016-11-20 DIAGNOSIS — R110 Nausea: Secondary | ICD-10-CM

## 2016-11-20 DIAGNOSIS — R933 Abnormal findings on diagnostic imaging of other parts of digestive tract: Secondary | ICD-10-CM

## 2016-11-20 DIAGNOSIS — R1084 Generalized abdominal pain: Secondary | ICD-10-CM

## 2016-11-20 HISTORY — PX: ESOPHAGOGASTRODUODENOSCOPY (EGD) WITH PROPOFOL: SHX5813

## 2016-11-20 LAB — POCT I-STAT 4, (NA,K, GLUC, HGB,HCT)
GLUCOSE: 70 mg/dL (ref 65–99)
HEMATOCRIT: 27 % — AB (ref 36.0–46.0)
Hemoglobin: 9.2 g/dL — ABNORMAL LOW (ref 12.0–15.0)
Potassium: 4.7 mmol/L (ref 3.5–5.1)
Sodium: 135 mmol/L (ref 135–145)

## 2016-11-20 SURGERY — ESOPHAGOGASTRODUODENOSCOPY (EGD) WITH PROPOFOL
Anesthesia: Monitor Anesthesia Care

## 2016-11-20 MED ORDER — CALCIUM ACETATE (PHOS BINDER) 667 MG PO CAPS
1334.0000 mg | ORAL_CAPSULE | Freq: Three times a day (TID) | ORAL | Status: DC
  Administered 2016-11-21 (×2): 1334 mg via ORAL
  Filled 2016-11-20 (×2): qty 2

## 2016-11-20 MED ORDER — PROPOFOL 500 MG/50ML IV EMUL
INTRAVENOUS | Status: DC | PRN
  Administered 2016-11-20: 75 ug/kg/min via INTRAVENOUS

## 2016-11-20 MED ORDER — SODIUM CHLORIDE 0.9 % IV SOLN
INTRAVENOUS | Status: DC | PRN
  Administered 2016-11-20: 11:00:00 via INTRAVENOUS

## 2016-11-20 MED ORDER — DOXERCALCIFEROL 4 MCG/2ML IV SOLN
INTRAVENOUS | Status: AC
  Administered 2016-11-20: 4 ug via INTRAVENOUS
  Filled 2016-11-20: qty 2

## 2016-11-20 MED ORDER — PENTAFLUOROPROP-TETRAFLUOROETH EX AERO
1.0000 | INHALATION_SPRAY | CUTANEOUS | Status: DC | PRN
Start: 2016-11-20 — End: 2016-11-21

## 2016-11-20 MED ORDER — SODIUM CHLORIDE 0.9 % IV SOLN: 100.0000 mL | INTRAVENOUS | Status: DC | PRN

## 2016-11-20 MED ORDER — PROPOFOL 10 MG/ML IV BOLUS
INTRAVENOUS | Status: DC | PRN
  Administered 2016-11-20 (×2): 20 mg via INTRAVENOUS

## 2016-11-20 MED ORDER — ACETAMINOPHEN 325 MG PO TABS
ORAL_TABLET | ORAL | Status: AC
  Filled 2016-11-20: qty 2

## 2016-11-20 MED ORDER — LIDOCAINE HCL (PF) 1 % IJ SOLN
5.0000 mL | INTRAMUSCULAR | Status: DC | PRN
  Filled 2016-11-20: qty 5

## 2016-11-20 MED ORDER — LIDOCAINE-PRILOCAINE 2.5-2.5 % EX CREA: 1.0000 "application " | TOPICAL_CREAM | CUTANEOUS | Status: DC | PRN

## 2016-11-20 MED ORDER — HEPARIN SODIUM (PORCINE) 1000 UNIT/ML DIALYSIS: 1000.0000 [IU] | INTRAMUSCULAR | Status: DC | PRN

## 2016-11-20 NOTE — Anesthesia Preprocedure Evaluation (Addendum)
Anesthesia Evaluation  Patient identified by MRN, date of birth, ID band Patient awake    Reviewed: Allergy & Precautions, NPO status , Patient's Chart, lab work & pertinent test results  Airway Mallampati: III  TM Distance: >3 FB Neck ROM: Full    Dental no notable dental hx. (+) Teeth Intact   Pulmonary pneumonia,    Pulmonary exam normal breath sounds clear to auscultation       Cardiovascular hypertension, Pt. on medications Normal cardiovascular exam Rhythm:Regular Rate:Normal     Neuro/Psych  Headaches, negative neurological ROS  negative psych ROS   GI/Hepatic Neg liver ROS, PUD,   Endo/Other  negative endocrine ROS  Renal/GU ESRF and DialysisRenal disease  negative genitourinary   Musculoskeletal negative musculoskeletal ROS (+)   Abdominal   Peds negative pediatric ROS (+)  Hematology negative hematology ROS (+) anemia ,   Anesthesia Other Findings   Reproductive/Obstetrics                            Anesthesia Physical  Anesthesia Plan  ASA: III  Anesthesia Plan: MAC   Post-op Pain Management:    Induction: Intravenous  Airway Management Planned: Natural Airway and Nasal Cannula  Additional Equipment:   Intra-op Plan:   Post-operative Plan:   Informed Consent: I have reviewed the patients History and Physical, chart, labs and discussed the procedure including the risks, benefits and alternatives for the proposed anesthesia with the patient or authorized representative who has indicated his/her understanding and acceptance.   Dental advisory given  Plan Discussed with: CRNA  Anesthesia Plan Comments:         Anesthesia Quick Evaluation

## 2016-11-20 NOTE — Anesthesia Postprocedure Evaluation (Signed)
Anesthesia Post Note  Patient: Kerry Cook  Procedure(s) Performed: Procedure(s) (LRB): ESOPHAGOGASTRODUODENOSCOPY (EGD) WITH PROPOFOL (N/A)  Patient location during evaluation: PACU Anesthesia Type: MAC Level of consciousness: awake and alert Pain management: pain level controlled Vital Signs Assessment: post-procedure vital signs reviewed and stable Respiratory status: spontaneous breathing, nonlabored ventilation, respiratory function stable and patient connected to nasal cannula oxygen Cardiovascular status: stable and blood pressure returned to baseline Anesthetic complications: no       Last Vitals:  Vitals:   11/20/16 1230 11/20/16 1240  BP: (!) 152/93 (!) 151/98  Pulse: 84 72  Resp: 12 18  Temp:      Last Pain:  Vitals:   11/20/16 1218  TempSrc: Oral  PainSc:                  [name]

## 2016-11-20 NOTE — Progress Notes (Signed)
Patient arrived to unit per bed.  Reviewed treatment plan and this RN agrees.  Report received from bedside RN, Jimmie Molly.  Consent verified.  Patient A & O X 4. Lung sounds clear and diminished to ausculation in all fields. Generalized non pitting edema. Cardiac: NSR.  Prepped LLAVF with alcohol and cannulated with two 15 gauge needles.  Pulsation of blood noted.  Flushed access well with saline per protocol.  Connected and secured lines and initiated tx at 2116.  UF goal of 3500 mL and net fluid removal of 3000 mL.  Will continue to monitor.

## 2016-11-20 NOTE — Progress Notes (Signed)
PROGRESS NOTE  RAELEE Cook ZOX:[id number] DOB: 28-Feb-1986 DOA: 11/17/2016 PCP: [name], FNP   LOS: 2 days   Brief Narrative: Kerry Cook is a 31 y.o. 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  She was hospitalized for this in October 2017, GI was consulted and have EGD on 10/17 which showed nonbleeding prepyloric and pyloric superficial gastric ulcers.      Assessment & Plan: Principal Problem:   Uncontrolled hypertension Active Problems:   End stage renal disease on dialysis ([place])   Acute gastritis without hemorrhage  Hypertensive urgency -Likely due to not being able to take her p.o. medications due to nausea and vomiting and abdominal pain. -continue home medications with antiemetics, continue hydralazine IV as needed  End-stage renal disease -Nephrology consulted HD MWF  Intractable nausea vomiting/abdominal pain -tolerating some PO-- GI consult and enteroscopy planned for today -Patient with known peptic ulcer disease and gastritis, continue Protonix IV   DVT prophylaxis: Lovenox Code Status: Full code Family Communication: No family at bedside Disposition Plan: Home 1-2 days  Consultants:   Nephrology  GI  Procedures:  enteroscopy 5/30   Subjective: Occasional pain no further nausea or vomiting  Objective: Vitals:   11/19/16 1759 11/19/16 1849 11/19/16 2031 11/20/16 0422  BP: (!) 176/96 (!) 143/92 (!) 146/98 (!) 147/92  Pulse: 73 69 68 72  Resp: 17  16 16   Temp: 97.8 F (36.6 C)  98.3 F (36.8 C) 98.4 F (36.9 C)  TempSrc: Oral  Oral Oral  SpO2: 98%  100% 100%  Weight:   58.7 kg (129 lb 8 oz)   Height:   5' (1.524 m)     Intake/Output Summary (Last 24 hours) at 11/20/16 0810 Last data filed at 11/20/16 0700  Gross per 24 hour  Intake              700 ml  Output                0 ml  Net               700 ml   Filed Weights   11/18/16 1855 11/18/16 2200 11/19/16 2031  Weight: 55.8 kg (123 lb 0.3 oz) 57.3 kg (126 lb 5.2 oz) 58.7 kg (129 lb 8 oz)    Examination:  Vitals:   11/19/16 1759 11/19/16 1849 11/19/16 2031 11/20/16 0422  BP: (!) 176/96 (!) 143/92 (!) 146/98 (!) 147/92  Pulse: 73 69 68 72  Resp: 17  16 16   Temp: 97.8 F (36.6 C)  98.3 F (36.8 C) 98.4 F (36.9 C)  TempSrc: Oral  Oral Oral  SpO2: 98%  100% 100%  Weight:   58.7 kg (129 lb 8 oz)   Height:   5' (1.524 m)     Constitutional: NAD- A+Ox3 Respiratory: no wheezing Cardiovascular: Rrr  Abdomen: +BS, mildly distended Skin: dry skin Neurologic: moves all 4 ext    Data Reviewed: I have personally reviewed following labs and imaging studies  CBC:  Recent Labs Lab 11/17/16 1947 11/18/16 0434 11/18/16 1500 11/19/16 0712  WBC 10.2 10.0 11.0* 8.7  HGB 12.0 10.1* 10.9* 10.2*  HCT 36.8 31.1* 33.2* 32.1*  MCV 92.9 93.1 91.7 93.9  PLT 214 221 213 811   Basic Metabolic Panel:  Recent Labs Lab 11/17/16 1947 11/18/16 0434 11/18/16 1141 11/18/16 1500 11/19/16 0712  NA 135  139  --  137 135  K 4.6 5.2*  --  5.3* 4.5  CL 94* 97*  --  96* 95*  CO2 24 27  --  24 25  GLUCOSE 74 78  --  80 68  BUN 31* 42*  --  46* 32*  CREATININE 11.74* 12.76*  --  12.97* 10.86*  CALCIUM 9.8 9.2  --  9.2 9.2  PHOS  --   --  7.5* 6.0*  --    GFR: Estimated Creatinine Clearance: 6.1 mL/min (A) (by C-G formula based on SCr of 10.86 mg/dL (H)). Liver Function Tests:  Recent Labs Lab 11/17/16 1947 11/18/16 1500 11/19/16 0712  AST 21  --  16  ALT 13*  --  9*  ALKPHOS 73  --  51  BILITOT 0.8  --  0.5  PROT 7.5  --  6.1*  ALBUMIN 4.2 3.5 3.4*    Recent Labs Lab 11/17/16 1947  LIPASE 23   No results for input(s): AMMONIA in the last 168 hours. Coagulation Profile: No results for input(s): INR, PROTIME in the last 168 hours. Cardiac Enzymes: No results for input(s): CKTOTAL, CKMB, CKMBINDEX,  TROPONINI in the last 168 hours. BNP (last 3 results) No results for input(s): PROBNP in the last 8760 hours. HbA1C: No results for input(s): HGBA1C in the last 72 hours. CBG: No results for input(s): GLUCAP in the last 168 hours. Lipid Profile: No results for input(s): CHOL, HDL, LDLCALC, TRIG, CHOLHDL, LDLDIRECT in the last 72 hours. Thyroid Function Tests: No results for input(s): TSH, T4TOTAL, FREET4, T3FREE, THYROIDAB in the last 72 hours. Anemia Panel: No results for input(s): VITAMINB12, FOLATE, FERRITIN, TIBC, IRON, RETICCTPCT in the last 72 hours. Urine analysis:    Component Value Date/Time   COLORURINE YELLOW 01/21/2015 1343   APPEARANCEUR HAZY (A) 01/21/2015 1343   LABSPEC 1.009 01/21/2015 1343   PHURINE 8.5 (H) 01/21/2015 1343   GLUCOSEU 100 (A) 01/21/2015 1343   HGBUR SMALL (A) 01/21/2015 1343   BILIRUBINUR NEGATIVE 01/21/2015 1343   KETONESUR 15 (A) 01/21/2015 1343   PROTEINUR >300 (A) 01/21/2015 1343   UROBILINOGEN 0.2 01/21/2015 1343   NITRITE NEGATIVE 01/21/2015 1343   LEUKOCYTESUR NEGATIVE 01/21/2015 1343   Sepsis Labs: Invalid input(s): PROCALCITONIN, LACTICIDVEN  Recent Results (from the past 240 hour(s))  MRSA PCR Screening     Status: None   Collection Time: 11/18/16  5:51 AM  Result Value Ref Range Status   MRSA by PCR NEGATIVE NEGATIVE Final    Comment:        The GeneXpert MRSA Assay (FDA approved for NASAL specimens only), is one component of a comprehensive MRSA colonization surveillance program. It is not intended to diagnose MRSA infection nor to guide or monitor treatment for MRSA infections.       Radiology Studies: Ct Abdomen Pelvis W Contrast  Result Date: 11/18/2016 CLINICAL DATA:  Abdominal pain with nausea and vomiting since yesterday. EXAM: CT ABDOMEN AND PELVIS WITH CONTRAST TECHNIQUE: Multidetector CT imaging of the abdomen and pelvis was performed using the standard protocol following bolus administration of intravenous  contrast. CONTRAST:  18mL ISOVUE-300 IOPAMIDOL (ISOVUE-300) INJECTION 61% COMPARISON:  CT scans June 08, 2015, March 24, 2016, and March 31, 2016 FINDINGS: Lower chest: Stable cardiomegaly.  Lung bases are otherwise normal. Hepatobiliary: No focal liver abnormality is seen. No gallstones, gallbladder wall thickening, or biliary dilatation. Pancreas: Unremarkable. No pancreatic ductal dilatation or surrounding inflammatory changes. Spleen: Normal in size without focal abnormality. Adrenals/Urinary Tract: The  adrenal glands are normal. The native kidneys are atrophic consistent with previous renal transplants. The 2 transplanted kidneys in the right pelvis remain calcified and atrophic. Stomach/Bowel: The stomach is mildly distended. There is bowel wall thickening associated with the duodenum and the jejunum. There is relative sparing of the ileum. No pneumatosis in the thickened bowel loops. No evidence of bowel obstruction. The colon is unremarkable. The appendix is not well visualized but there is no evidence of appendicitis. Vascular/Lymphatic: Mild atherosclerotic change in the non aneurysmal aorta. No aneurysm or dissection. No adenopathy. Reproductive: Uterus and bilateral adnexa are unremarkable. Other: Diffuse ascites. No free air. Mild increased attenuation in the subcutaneous fat consistent with volume overload. There is a periumbilical fat containing hernia. Musculoskeletal: No acute or significant osseous findings. IMPRESSION: 1. Wall thickening associated with the duodenum and jejunum with relative sparing of the ileum. Given history of a previous kidney transplant, this could represent sequela of post transplant lymphoproliferative disorder. However, infectious, inflammatory, and vascular etiologies could demonstrate the same appearance. Recommend clinical correlation. The findings on today's study are more diffuse than on the October 2017 studies. 2. Calcified atrophic transplant kidneys in the  right pelvis. 3. Diffuse ascites. Increased attenuation in the subcutaneous fat consist with volume overload. 4. Mild atherosclerosis. 5. No other acute abnormalities. Electronically Signed   By: Dorise Bullion III M.D   On: 11/18/2016 13:38     Scheduled Meds: . amLODipine  10 mg Oral Daily  . cloNIDine  0.2 mg Oral TID  . docusate sodium  100 mg Oral BID  . doxercalciferol  4 mcg Intravenous Q M,W,F-HD  . [START ON 11/21/2016] enoxaparin (LOVENOX) injection  30 mg Subcutaneous Daily  . losartan  100 mg Oral QPM  . multivitamin  1 tablet Oral QHS  . pantoprazole (PROTONIX) IV  40 mg Intravenous Q12H  . sodium chloride flush  3 mL Intravenous Q12H   Continuous Infusions:   Lalani U Mickie Kozikowski [facility] Pager [phone]  If 7PM-7AM, please contact night-coverage [email or web address] Password [email or web address] 11/20/2016, 8:10 AM

## 2016-11-20 NOTE — H&P (View-Only) (Signed)
[facility] Consult: 8:44 AM 11/19/2016  LOS: 1 day    Referring Provider: Dr Eliseo Squires  Primary Care Physician:  [name], FNP Primary Gastroenterologist:  Dr. Carlean Purl    Reason for Consultation:  Nausea, vomiting   HPI: Kerry Cook is a 31 y.o. female.  PMH ESRD (due to htn), [facility] dialysis.  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   Hyperparathyroidism.  Htn.  Anemia requiring transfusion 2016.  03/2016 EGD for N/V.  Showed non-bleeding pre-pyloric and pyloric ulcers.  Pathology: benign SB, reactive gastropathy, mild inflammation.  No H Pylori.  No dysplasia etc.   CT scans x 2 in 03/2016 showed thickening, inflammation in prox SB and adjacent mesentery, more pronounced on CT #2.  Gastric distention and pyloric irregularity likely represent at least partial GOO. Protonix decreased from BID to q day in 06/2016 at GI fup.  Subsequent visits to ED with ongoing epigastric pain, hypertension.  Protonix increased back to BID.  Attacks of pain seem to be related to specific foods.  4/26 visit with GI PA-C [facility] for epigastric pain.  Zantac 150 mg added to BID Protonix.  Consider EGD for recalcitrant issues.   Doing well Sunday AM. 5/27.  ~ 4 PM, developed recurrent 10/10 epigastric pain.  Some nausea, no emesis.  At lunch she had eaten chicken, which normally causes no problems. She does say that when she eats red meat, it tends to flareup her symptoms.  Readmitted 5/27 for eval.  So far at the hospital she hasn't vomited.  The pain is well-controlled with either IV Dilaudid or Percocet.  Symptoms worse after using Carafate CT scan ab/pelvis: duodenal and jejunal wall thickening, ileum spared.  Changes more widespread than 03/2016.  ? Post transplant lymphoproliferative d/o vs infectious, inflammatory,  and vascular etiologies.  Diffuse ascites, increased subq fat attenuation c/w volume overload.  Mild atherosclerosis. Current and recent LFTs, Lipase consistently normal.    Hgb 12 >> 10.2, baseline is 11 to 12.   HCG slightly elevated at 11.2, 3 on repeat exam.  (normal is <5)  Troponins slightly elevated  Hypertension persists.  180s -202/99-140s in last 24 hours.  She attributes the rise in her blood pressure to increased abdominal pain.  Pain does not radiate beyond the epigastrium. Nausea is not a common feature but when the pain is bad she will get nauseated. She does not vomit often at all. Normally having daily bowel movements. Currently tolerating clear liquid diet.      Past Medical History:  Diagnosis Date  . Chronic kidney disease    previous hx dialysis  . Dialysis patient ([place])   . History of kidney transplant 2012   kidney failure due to hypertension  . Hypertension   . Peptic ulcer   . Renal insufficiency     Past Surgical History:  Procedure Laterality Date  . ESOPHAGOGASTRODUODENOSCOPY N/A 04/01/2016   Procedure: ESOPHAGOGASTRODUODENOSCOPY (EGD);  Surgeon: Gatha Mayer, MD;  Location: [facility] ENDOSCOPY;  Service: Endoscopy;  Laterality: N/A;  . KIDNEY TRANSPLANT Bilateral 2012  Prior to Admission medications   Medication Sig Start Date End Date Taking? Authorizing Provider  acetaminophen (TYLENOL) 325 MG tablet Take 650 mg by mouth every 6 (six) hours as needed for headache (pain).   Yes [provider]  amLODipine (NORVASC) 10 MG tablet Take 1 tablet (10 mg total) by mouth every evening. Patient taking differently: Take 10 mg by mouth daily.  09/13/15  Yes [name], MD  calcium acetate, Phos Binder, (PHOSLYRA) 667 MG/5ML SOLN Take 1,334 mg by mouth 3 (three) times daily with meals.   Yes [provider]  cloNIDine (CATAPRES) 0.1 MG tablet Take 0.1 mg by mouth 2 (two) times daily.   Yes [provider]  labetalol (NORMODYNE) 200  MG tablet Take 1 tablet (200 mg total) by mouth 2 (two) times daily. 04/02/16  Yes Elgergawy, Silver Huguenin, MD  losartan (COZAAR) 100 MG tablet Take 1 tablet (100 mg total) by mouth every evening. 05/21/16  Yes Verlee Monte, MD  multivitamin (RENA-VIT) TABS tablet Take 1 tablet by mouth at bedtime. 06/09/15  Yes Debbe Odea, MD  ondansetron (ZOFRAN ODT) 8 MG disintegrating tablet Take 1 tablet (8 mg total) by mouth every 8 (eight) hours as needed for nausea or vomiting. 03/24/16  Yes Jola Schmidt, MD  pantoprazole (PROTONIX) 40 MG tablet Take 1 tablet (40 mg total) by mouth 2 (two) times daily. 10/17/16  Yes Levin Erp, PA  ranitidine (ZANTAC) 150 MG tablet Take 1 tablet (150 mg total) by mouth 2 (two) times daily. 10/17/16  Yes Levin Erp, PA  Simethicone (GAS-X PO) Take 1 tablet by mouth daily as needed (flatulence/bloatin).   Yes [provider]    Scheduled Meds: . amLODipine  10 mg Oral Daily  . cloNIDine  0.1 mg Oral BID  . docusate sodium  100 mg Oral BID  . doxercalciferol  4 mcg Intravenous Q M,W,F-HD  . enoxaparin (LOVENOX) injection  30 mg Subcutaneous Daily  . famotidine  20 mg Oral BID  . losartan  100 mg Oral QPM  . multivitamin  1 tablet Oral QHS  . pantoprazole (PROTONIX) IV  40 mg Intravenous Q12H  . sodium chloride flush  3 mL Intravenous Q12H  . sucralfate  1 g Oral TID WC & HS   Infusions:  PRN Meds: acetaminophen **OR** acetaminophen, camphor-menthol, hydrALAZINE, labetalol, ondansetron **OR** ondansetron (ZOFRAN) IV, oxyCODONE-acetaminophen, simethicone   Allergies as of 11/17/2016  . (No Known Allergies)    Family History  Problem Relation Age of Onset  . Kidney disease Father   . Lupus Maternal Grandmother   . Cancer Maternal Grandfather   . Colon cancer Neg Hx   . Stomach cancer Neg Hx   . Rectal cancer Neg Hx   . Esophageal cancer Neg Hx   . Liver cancer Neg Hx     Social History   Social History  . Marital status:  Single    Spouse name: N/A  . Number of children: 0  . Years of education: N/A   Occupational History  . Daycare    Social History Main Topics  . Smoking status: Never Smoker  . Smokeless tobacco: Never Used  . Alcohol use No  . Drug use: No  . Sexual activity: Yes    Partners: Male    Birth control/ protection: Condom, None   Other Topics Concern  . Not on file   Social History Narrative  . No narrative on file    REVIEW OF SYSTEMS: Constitutional:  No increased weakness or fatigue. ENT:  No nose bleeds.  No congestion Pulm:  No trouble breathing. No cough. CV:  No palpitations, no LE edema.   No chest pain.  No problems with vascular access for her dialysis. GU:  Anuric GI:  Per HPI Heme:  No unusual bleeding or bruising.   Transfusions:  No transfusions since 2016. Neuro:  Occasional headache. no peripheral tingling or numbness Derm:  In general, for her, narcotics cause pruritus which she is willing to live with because they do control the abdominal pain. Endocrine:  No sweats or chills.  No polyuria or dysuria Immunization:  Did not inquire. Travel:  None beyond local counties in last few months.    PHYSICAL EXAM: Vital signs in last 24 hours: Vitals:   11/19/16 0812 11/19/16 0836  BP: (!) 192/114 (!) 180/112  Pulse: 88 84  Resp: (!) 22   Temp: 98.6 F (37 C)    Wt Readings from Last 3 Encounters:  11/18/16 57.3 kg (126 lb 5.2 oz)  11/08/16 59 kg (130 lb)  10/17/16 59 kg (130 lb)    General: Pleasant, comfortable. Non-ill appearing AAF. Fully awake and alert. Head:  No signs of head trauma. No facial asymmetry or swelling.  Eyes:  No scleral icterus, no conjunctival pallor. Ears:  Not hard of hearing  Nose:  No congestion, no discharge. Mouth:  Moist, clear, oral mucosa. Good dentition. Neck:  No JVD, no masses, no thyromegaly. Lungs:  Clear bilaterally. No cough. No labored breathing. Heart: RRR. No MRG. S1, S2 present. Abdomen:  Soft. Not  tender, not distended. Bowel sounds active. No organomegaly, hernias, bruits.. Rectal: Deferred   Musc/Skeltl: No joint swelling or gross deformities. Extremities:  No CCE. Thrill palpable in hemodialysis graft in lower left arm.  Neurologic:  Alert. Calm. Oriented times 3. Moves all 4 limbs, no weakness. No tremor. No asterixis. Skin:  No sores, rash, suspicious lesions. Tattoos:  Tattoos on the arms bilaterally. Nodes:  No cervical adenopathy.   Psych:  Pleasant, calm.  Intake/Output from previous day: 05/28 0701 - 05/29 0700 In: 643 [P.O.:640; I.V.:3] Out: 2095 [Emesis/NG output:100; Stool:1] Intake/Output this shift: No intake/output data recorded.  LAB RESULTS:  Recent Labs  11/18/16 0434 11/18/16 1500 11/19/16 0712  WBC 10.0 11.0* 8.7  HGB 10.1* 10.9* 10.2*  HCT 31.1* 33.2* 32.1*  PLT 221 213 182   BMET Lab Results  Component Value Date   NA 135 11/19/2016   NA 137 11/18/2016   NA 139 11/18/2016   K 4.5 11/19/2016   K 5.3 (H) 11/18/2016   K 5.2 (H) 11/18/2016   CL 95 (L) 11/19/2016   CL 96 (L) 11/18/2016   CL 97 (L) 11/18/2016   CO2 25 11/19/2016   CO2 24 11/18/2016   CO2 27 11/18/2016   GLUCOSE 68 11/19/2016   GLUCOSE 80 11/18/2016   GLUCOSE 78 11/18/2016   BUN 32 (H) 11/19/2016   BUN 46 (H) 11/18/2016   BUN 42 (H) 11/18/2016   CREATININE 10.86 (H) 11/19/2016   CREATININE 12.97 (H) 11/18/2016   CREATININE 12.76 (H) 11/18/2016   CALCIUM 9.2 11/19/2016   CALCIUM 9.2 11/18/2016   CALCIUM 9.2 11/18/2016   LFT  Recent Labs  11/17/16 1947 11/18/16 1500 11/19/16 0712  PROT 7.5  --  6.1*  ALBUMIN 4.2 3.5 3.4*  AST 21  --  16  ALT 13*  --  9*  ALKPHOS 73  --  51  BILITOT 0.8  --  0.5   PT/INR Lab Results  Component Value Date   INR 1.0 09/03/2007   INR 1.0 09/02/2007   INR 1.0 08/26/2007   Hepatitis Panel No results for input(s): HEPBSAG, HCVAB, HEPAIGM, HEPBIGM in the last 72 hours. C-Diff No components found for: CDIFF Lipase       Component Value Date/Time   LIPASE 23 11/17/2016 1947    Drugs of Abuse     Component Value Date/Time   LABOPIA NEG 06/02/2014 1103   COCAINSCRNUR NEG 06/02/2014 1103   LABBENZ NEG 06/02/2014 1103   LABBENZ NEGATIVE 08/26/2007 0820   AMPHETMU NEG 06/02/2014 1103   THCU NEG 06/02/2014 1103   LABBARB NEG 06/02/2014 1103     RADIOLOGY STUDIES: Ct Abdomen Pelvis W Contrast  Result Date: 11/18/2016 CLINICAL DATA:  Abdominal pain with nausea and vomiting since yesterday. EXAM: CT ABDOMEN AND PELVIS WITH CONTRAST TECHNIQUE: Multidetector CT imaging of the abdomen and pelvis was performed using the standard protocol following bolus administration of intravenous contrast. CONTRAST:  34mL ISOVUE-300 IOPAMIDOL (ISOVUE-300) INJECTION 61% COMPARISON:  CT scans June 08, 2015, March 24, 2016, and March 31, 2016 FINDINGS: Lower chest: Stable cardiomegaly.  Lung bases are otherwise normal. Hepatobiliary: No focal liver abnormality is seen. No gallstones, gallbladder wall thickening, or biliary dilatation. Pancreas: Unremarkable. No pancreatic ductal dilatation or surrounding inflammatory changes. Spleen: Normal in size without focal abnormality. Adrenals/Urinary Tract: The adrenal glands are normal. The native kidneys are atrophic consistent with previous renal transplants. The 2 transplanted kidneys in the right pelvis remain calcified and atrophic. Stomach/Bowel: The stomach is mildly distended. There is bowel wall thickening associated with the duodenum and the jejunum. There is relative sparing of the ileum. No pneumatosis in the thickened bowel loops. No evidence of bowel obstruction. The colon is unremarkable. The appendix is not well visualized but there is no evidence of appendicitis. Vascular/Lymphatic: Mild atherosclerotic change in the non aneurysmal aorta. No aneurysm or dissection. No adenopathy. Reproductive: Uterus and bilateral adnexa are unremarkable. Other: Diffuse ascites. No free air.  Mild increased attenuation in the subcutaneous fat consistent with volume overload. There is a periumbilical fat containing hernia. Musculoskeletal: No acute or significant osseous findings. IMPRESSION: 1. Wall thickening associated with the duodenum and jejunum with relative sparing of the ileum. Given history of a previous kidney transplant, this could represent sequela of post transplant lymphoproliferative disorder. However, infectious, inflammatory, and vascular etiologies could demonstrate the same appearance. Recommend clinical correlation. The findings on today's study are more diffuse than on the October 2017 studies. 2. Calcified atrophic transplant kidneys in the right pelvis. 3. Diffuse ascites. Increased attenuation in the subcutaneous fat consist with volume overload. 4. Mild atherosclerosis. 5. No other acute abnormalities. Electronically Signed   By: Dorise Bullion III M.D   On: 11/18/2016 13:38     IMPRESSION:   *  Epigastric pain, nausea without vomiting.  Latest CT with ongoing, more widespread SB ( doudenal and Jejunal) thickening.  EGD in 03/2016 with gastritis, duodenitis.  Sxs persist despite BID PPI and HS H2 blocker.  Suspect the duodenitis, jejunitis is not acid peptic in origin. Patient's symptoms have failed to respond to maximum dose PPI, addition of at bedtime H2 blocker and symptoms aggravated by addition of Carafate.  *  ESRD, MWF HD.  bil renal transplant 2012, failed in 2015.    *  Hypertension.    *  Normocytic anemia.  No transfusions since 2016.    PLAN:     *  Stop the Carafate. She feels that this is worsening her problems and it is certainly not helping.  Also stopped the twice daily famotidine, it is not helping.  *  Enteroscopy tomorrow.      Azucena Freed  11/19/2016, 8:44 AM Pager: [phone]   I have reviewed the entire case in detail with the above APP and discussed the plan in detail.  Therefore, I agree with the diagnoses recorded above. In  addition,  I have personally interviewed and examined the patient and have personally reviewed any abdominal/pelvic CT scan images.  My additional thoughts are as follows:  Her chronic abd pain is somewhat vague, an it sounds as if she has intermittent N/V.  She has a large volume of ascites from ESRD.  I think this is giving the appearance of SB thickening/inflammation, but I doubt there is real mucosal disease.  We will see with an enteroscopy tomorrow.  If nothing, then I do not feel we can do any more for her pain or ascites, and anti-emetics are probably needed long term.    [name] Pager [phone]  Mon-Fri 8a-5p [phone] after 5p, weekends, holidays

## 2016-11-20 NOTE — Anesthesia Procedure Notes (Signed)
Procedure Name: MAC Date/Time: 11/20/2016 11:42 AM Performed by: Kyung Rudd Pre-anesthesia Checklist: Patient identified, Emergency Drugs available, Suction available and Patient being monitored Patient Re-evaluated:Patient Re-evaluated prior to inductionOxygen Delivery Method: Nasal cannula Intubation Type: IV induction Placement Confirmation: positive ETCO2

## 2016-11-20 NOTE — Transfer of Care (Signed)
Immediate Anesthesia Transfer of Care Note  Patient: Kerry Cook  Procedure(s) Performed: Procedure(s): ESOPHAGOGASTRODUODENOSCOPY (EGD) WITH PROPOFOL (N/A)  Patient Location: Endoscopy Unit  Anesthesia Type:MAC  Level of Consciousness: awake, alert  and oriented  Airway & Oxygen Therapy: Patient Spontanous Breathing and Patient connected to nasal cannula oxygen  Post-op Assessment: Report given to RN, Post -op Vital signs reviewed and stable and Patient moving all extremities  Post vital signs: Reviewed and stable  Last Vitals:  Vitals:   11/20/16 1053 11/20/16 1218  BP: (!) 200/110 (!) 157/87  Pulse: 76 81  Resp: 12 (!) 22  Temp: 36.7 C     Last Pain:  Vitals:   11/20/16 1218  TempSrc: Oral  PainSc:       Patients Stated Pain Goal: 0 (12/87/86 7672)  Complications: No apparent anesthesia complications

## 2016-11-20 NOTE — Progress Notes (Signed)
[facility] Progress Note  Interval History: 31 y.o. female with ESRD secondary to HTN, h/o failed renal transplant, gastric ulcer, PUD. Presented to [facility] ED with abdominal pain and vomiting. ED course also significant for markedly elevated BP. EGD 03/2016 with non-bleeding pre-pyloric and pyloric superficial gastric ulcers with benign pathology Patient with frequent visits to ED secondary to abdominal pain/gastritis with elevated BP. Noted some concern about compliance with outpatient medications.   Dialysis Orders:  [facility] MWF 3h75mins 180 F BFR 400/800 2K/2.25 Ca EDW 56.5kg  L AVF No Heparin Hectorol 4 mcg IV q HD Venofer 50 mg IV q wk Mircera 50 mcg IV q 2 wks (last 5/21)  BMM Ca acetate 2 q ac    Assessment/Plan: 1.  Abdominal pain/gastritis - Abd CT with wall thickening associated with the duodenum and jejunum -GI following not responding well to PPIs - planning enteroscopy/  2.  ESRD -  MWF - Cont on schedule  3.  Hypertension/volume  - BP improved today/ clonidine increased I- ascites seen on abd CT  suggesting vol overload  Post HD wt 5/28 55.8 kg so got below EDW if weights accurate / Challenge EDW next HD 4.  Anemia  - Hgb stable follow  5.  Metabolic bone disease -   Cont VDRA/ P 6.0  Resume Ca acetate when eating  6.  Nutrition - Currently NPO Albumin 3.4 renal diet/vitamins    Subjective:  No abdominal pain this am. For enteroscopy today   Objective Vitals:   11/19/16 1759 11/19/16 1849 11/19/16 2031 11/20/16 0422  BP: (!) 176/96 (!) 143/92 (!) 146/98 (!) 147/92  Pulse: 73 69 68 72  Resp: 17  16 16   Temp: 97.8 F (36.6 C)  98.3 F (36.8 C) 98.4 F (36.9 C)  TempSrc: Oral  Oral Oral  SpO2: 98%  100% 100%  Weight:   58.7 kg (129 lb 8 oz)   Height:   5' (1.524 m)    Physical Exam General: WNWD AAF NAD Heart: RRR Lungs: CTAB Abdomen: bs+ soft NT/ND Extremities: no LE edema  Dialysis Access: LUE AVF +bruit   Additional Objective Labs: Basic  Metabolic Panel:  Recent Labs Lab 11/18/16 0434 11/18/16 1141 11/18/16 1500 11/19/16 0712  NA 139  --  137 135  K 5.2*  --  5.3* 4.5  CL 97*  --  96* 95*  CO2 27  --  24 25  GLUCOSE 78  --  80 68  BUN 42*  --  46* 32*  CREATININE 12.76*  --  12.97* 10.86*  CALCIUM 9.2  --  9.2 9.2  PHOS  --  7.5* 6.0*  --    Liver Function Tests:  Recent Labs Lab 11/17/16 1947 11/18/16 1500 11/19/16 0712  AST 21  --  16  ALT 13*  --  9*  ALKPHOS 73  --  51  BILITOT 0.8  --  0.5  PROT 7.5  --  6.1*  ALBUMIN 4.2 3.5 3.4*    Recent Labs Lab 11/17/16 1947  LIPASE 23   CBC:  Recent Labs Lab 11/17/16 1947 11/18/16 0434 11/18/16 1500 11/19/16 0712  WBC 10.2 10.0 11.0* 8.7  HGB 12.0 10.1* 10.9* 10.2*  HCT 36.8 31.1* 33.2* 32.1*  MCV 92.9 93.1 91.7 93.9  PLT 214 221 213 182   Blood Culture    Component Value Date/Time   SDES URINE, CLEAN CATCH 06/26/2014 2035   SPECREQUEST None 06/26/2014 2035   CULT  06/26/2014 2035  LACTOBACILLUS SPECIES Note: Standardized susceptibility testing for this organism is not available. Performed at [facility] 06/28/2014 FINAL 06/26/2014 2035    Cardiac Enzymes: No results for input(s): CKTOTAL, CKMB, CKMBINDEX, TROPONINI in the last 168 hours. CBG: No results for input(s): GLUCAP in the last 168 hours. Iron Studies: No results for input(s): IRON, TIBC, TRANSFERRIN, FERRITIN in the last 72 hours. Lab Results  Component Value Date   INR 1.0 09/03/2007   INR 1.0 09/02/2007   INR 1.0 08/26/2007   Medications:  . amLODipine  10 mg Oral Daily  . cloNIDine  0.2 mg Oral TID  . docusate sodium  100 mg Oral BID  . doxercalciferol  4 mcg Intravenous Q M,W,F-HD  . [START ON 11/21/2016] enoxaparin (LOVENOX) injection  30 mg Subcutaneous Daily  . losartan  100 mg Oral QPM  . multivitamin  1 tablet Oral QHS  . pantoprazole (PROTONIX) IV  40 mg Intravenous Q12H  . sodium chloride flush  3 mL Intravenous Q12H     Lynnda Child PA-C [place] Pager [phone] 11/20/2016,9:53 AM  LOS: 2 days   Renal Attending: I agree wioth above note and pt for endo today. Hilding Quintanar C

## 2016-11-20 NOTE — Op Note (Signed)
[facility] Patient Name: Kerry Cook Procedure Date : 11/20/2016 MRN: [id number] Attending MD: Estill Cotta. Loletha Carrow , MD Date of Birth: 1985/12/08 CSN: [id number] Age: 31 Admit Type: Inpatient Procedure:                Small bowel enteroscopy Indications:              Unexplained generalized abdominal distress/pain,                            Nausea, CT scan suggesting small bowel thickening                            in the setting of ascites from chronic renal failure Providers:                Mallie Mussel L. Loletha Carrow, MD, Burtis Junes, RN, Tinnie Gens,                            Technician Referring MD:              Medicines:                Monitored Anesthesia Care Complications:            No immediate complications. Estimated Blood Loss:     Estimated blood loss: none. Procedure:                Pre-Anesthesia Assessment:                           - Prior to the procedure, a History and Physical                            was performed, and patient medications and                            allergies were reviewed. The patient's tolerance of                            previous anesthesia was also reviewed. The risks                            and benefits of the procedure and the sedation                            options and risks were discussed with the patient.                            All questions were answered, and informed consent                            was obtained. Prior Anticoagulants: The patient has                            taken no previous anticoagulant or antiplatelet  agents. ASA Grade Assessment: III - A patient with                            severe systemic disease. After reviewing the risks                            and benefits, the patient was deemed in                            satisfactory condition to undergo the procedure.                           After obtaining informed consent, the endoscope was             passed under direct vision. Throughout the                            procedure, the patient's blood pressure, pulse, and                            oxygen saturations were monitored continuously. The                            [id number] ([id number]) scope was introduced through                            the mouth and advanced to the second part of                            duodenum. The [id number] ([id number]) scope was                            introduced through the and advanced to the. The                            patient tolerated the procedure. The small bowel                            enteroscopy was somewhat difficult due to                            significant looping. The enteroscope was passed                            first, but could not be advanced beyond the 2nd                            portion of the duodenum due to scope looping in the                            stomach. The EGD scope also looped in the gastric  fundus and could not be advanced any further. Scope In: Scope Out: Findings:      There was no evidence of significant pathology in the entire examined       duodenum.      The esophagus was normal.      Patchy moderate inflammation characterized by erythema was found in the       prepyloric region of the stomach. Similar in appearance to prior EGD.       This was previously biopsied, so not biopsied on this exam.There was no       gastric dilatation or retained food. Impression:               - Normal examined duodenum.                           - Normal esophagus.                           - Chronic gastritis.                           - No specimens collected.                           The symptoms appear to be the result of chronic                            ascites and renal failure. No true small bowel                            edema or inflammation was seen. see GI consult note                            from  yesterday for impression. Moderate Sedation:      MAC sedation used Recommendation:           - Resume previous diet.                           - Continue present medications. As needed use of                            anti-emetics. If current antiemetics fail to                            completely control symtoms, consider renal dose                            metoclopramide. Procedure Code(s):        --- Professional ---                           [phone], Esophagogastroduodenoscopy, flexible,                            transoral; diagnostic, including collection of  specimen(s) by brushing or washing, when performed                            (separate procedure) Diagnosis Code(s):        --- Professional ---                           K29.50, Unspecified chronic gastritis without                            bleeding                           R10.84, Generalized abdominal pain                           R11.0, Nausea CPT copyright 2016 [employer] Association. All rights reserved. The codes documented in this report are preliminary and upon coder review may  be revised to meet current compliance requirements. Henry L. Loletha Carrow, MD 11/20/2016 12:19:34 PM This report has been signed electronically. Number of Addenda: 0

## 2016-11-20 NOTE — Interval H&P Note (Signed)
History and Physical Interval Note:  11/20/2016 11:23 AM  Kerry Cook  has presented today for surgery, with the diagnosis of duodenitis, jejunitis.  epigastric pain  The various methods of treatment have been discussed with the patient and family. After consideration of risks, benefits and other options for treatment, the patient has consented to  Procedure(s): ENTEROSCOPY (N/A) as a surgical intervention .  The patient's history has been reviewed, patient examined, no change in status, stable for surgery.  I have reviewed the patient's chart and labs.  Questions were answered to the patient's satisfaction.     [name]

## 2016-11-21 DIAGNOSIS — Z992 Dependence on renal dialysis: Secondary | ICD-10-CM | POA: Diagnosis not present

## 2016-11-21 DIAGNOSIS — T8610 Unspecified complication of kidney transplant: Secondary | ICD-10-CM | POA: Diagnosis not present

## 2016-11-21 DIAGNOSIS — N186 End stage renal disease: Secondary | ICD-10-CM | POA: Diagnosis not present

## 2016-11-21 DIAGNOSIS — K2900 Acute gastritis without bleeding: Secondary | ICD-10-CM

## 2016-11-21 DIAGNOSIS — I10 Essential (primary) hypertension: Secondary | ICD-10-CM

## 2016-11-21 NOTE — Progress Notes (Signed)
Dialysis treatment completed.  3500 mL ultrafiltrated and net fluid removal 3000 mL.    Patient status unchanged. Lung sounds diminisehd to ausculation in all fields. Generalized edema. Cardiac: NSR.  Disconnected lines and removed needles.  Pressure held for 10 minutes and band aid/gauze dressing applied.  Report given to bedside RN, Jimmie Molly.

## 2016-11-21 NOTE — Progress Notes (Signed)
Kerry Cook to be D/C'd Home per MD order.  Discussed prescriptions and follow up appointments with the patient. Prescriptions given to patient, medication list explained in detail. Pt verbalized understanding.  Allergies as of 11/21/2016   No Known Allergies     Medication List    STOP taking these medications   ranitidine 150 MG tablet Commonly known as:  ZANTAC     TAKE these medications   acetaminophen 325 MG tablet Commonly known as:  TYLENOL Take 650 mg by mouth every 6 (six) hours as needed for headache (pain).   amLODipine 10 MG tablet Commonly known as:  NORVASC Take 1 tablet (10 mg total) by mouth every evening. What changed:  when to take this   calcium acetate (Phos Binder) 667 MG/5ML Soln Commonly known as:  PHOSLYRA Take 1,334 mg by mouth 3 (three) times daily with meals.   cloNIDine 0.1 MG tablet Commonly known as:  CATAPRES Take 0.1 mg by mouth 2 (two) times daily.   GAS-X PO Take 1 tablet by mouth daily as needed (flatulence/bloatin).   labetalol 200 MG tablet Commonly known as:  NORMODYNE Take 1 tablet (200 mg total) by mouth 2 (two) times daily.   losartan 100 MG tablet Commonly known as:  COZAAR Take 1 tablet (100 mg total) by mouth every evening.   multivitamin Tabs tablet Take 1 tablet by mouth at bedtime.   ondansetron 8 MG disintegrating tablet Commonly known as:  ZOFRAN ODT Take 1 tablet (8 mg total) by mouth every 8 (eight) hours as needed for nausea or vomiting.   pantoprazole 40 MG tablet Commonly known as:  PROTONIX Take 1 tablet (40 mg total) by mouth 2 (two) times daily.       Vitals:   11/21/16 0931 11/21/16 1100  BP: (!) 162/102 (!) 149/96  Pulse: 64   Resp: 18   Temp: 98.2 F (36.8 C)     Skin clean, dry and intact without evidence of skin break down, no evidence of skin tears noted. IV catheter discontinued intact. Site without signs and symptoms of complications. Dressing and pressure applied. Pt denies pain at  this time. No complaints noted.  An After Visit Summary was printed and given to the patient. Patient escorted via [place], and D/C home via private auto.  Emilio Math, RN [facility] Phone [phone]

## 2016-11-21 NOTE — Discharge Summary (Signed)
Physician Discharge Summary  Kerry Cook MPN:[id number] DOB: 1986/05/03 DOA: 11/17/2016  PCP: [name], FNP  Admit date: 11/17/2016 Discharge date: 11/21/2016  Admitted From:home Disposition:home  Recommendations for Outpatient Follow-up:  1. Follow up with PCP in 1-2 weeks 2. Please obtain BMP/CBC in one week 3.  Home Health:no Equipment/Devices:no Discharge Condition:stable CODE STATUS:full Diet recommendation:heart healthy  Brief/Interim Summary: 31 y.o.femalewith medical history significant of PUD and ESRD on dialysis (s/p failed renal transplant) with chronically very uncontrolled HTN presenting with abdominal pain and N/V. Patient was evaluated by gastroenterologist. She underwent upper GI endoscopy which showed normal finding except chronic gastritis. GI recommended to resume previous diet and medications. Patient clinically improved. He had nausea vomiting or abdominal pain. Able to tolerate diet. Patient was educated to take Protonix before meals twice a day. She received hemodialysis treatment as per nephrologist. Patient is clinically improved. Discharged home in stable condition. Treated for uncontrolled hypertension.  Discharge Diagnoses:  Principal Problem:   Uncontrolled hypertension Active Problems:   End stage renal disease on dialysis ([place])   Acute gastritis without hemorrhage   Generalized abdominal pain   Nausea without vomiting   Abnormal finding on GI tract imaging    Discharge Instructions  Discharge Instructions    Call MD for:  difficulty breathing, headache or visual disturbances    Complete by:  As directed    Call MD for:  extreme fatigue    Complete by:  As directed    Call MD for:  hives    Complete by:  As directed    Call MD for:  persistant dizziness or light-headedness    Complete by:  As directed    Call MD for:  persistant nausea and vomiting    Complete by:  As directed    Call MD for:  severe uncontrolled pain     Complete by:  As directed    Call MD for:  temperature >100.4    Complete by:  As directed    Diet - low sodium heart healthy    Complete by:  As directed    Discharge instructions    Complete by:  As directed    Please take Protonix before meals. Follow up with your PCP and GI as outpatient.   Increase activity slowly    Complete by:  As directed      Allergies as of 11/21/2016   No Known Allergies     Medication List    STOP taking these medications   ranitidine 150 MG tablet Commonly known as:  ZANTAC     TAKE these medications   acetaminophen 325 MG tablet Commonly known as:  TYLENOL Take 650 mg by mouth every 6 (six) hours as needed for headache (pain).   amLODipine 10 MG tablet Commonly known as:  NORVASC Take 1 tablet (10 mg total) by mouth every evening. What changed:  when to take this   calcium acetate (Phos Binder) 667 MG/5ML Soln Commonly known as:  PHOSLYRA Take 1,334 mg by mouth 3 (three) times daily with meals.   cloNIDine 0.1 MG tablet Commonly known as:  CATAPRES Take 0.1 mg by mouth 2 (two) times daily.   GAS-X PO Take 1 tablet by mouth daily as needed (flatulence/bloatin).   labetalol 200 MG tablet Commonly known as:  NORMODYNE Take 1 tablet (200 mg total) by mouth 2 (two) times daily.   losartan 100 MG tablet Commonly known as:  COZAAR Take 1 tablet (100 mg total) by mouth  every evening.   multivitamin Tabs tablet Take 1 tablet by mouth at bedtime.   ondansetron 8 MG disintegrating tablet Commonly known as:  ZOFRAN ODT Take 1 tablet (8 mg total) by mouth every 8 (eight) hours as needed for nausea or vomiting.   pantoprazole 40 MG tablet Commonly known as:  PROTONIX Take 1 tablet (40 mg total) by mouth 2 (two) times daily.      Follow-up Information    [name], FNP. Schedule an appointment as soon as possible for a visit in 1 week(s).   Specialty:  Family Medicine Contact information: [place]. [employer] [place] [address] [phone]          No Known Allergies  Consultations: Gastroenterologist Nephrologist  Procedures/Studies: Upper GI endoscopy  Subjective: Seen and examined at bedside. Denied nausea, vomiting, chest pain, shortness of breath. Able to tolerate diet well. Verbalized understanding of outpatient follow-up instructions.  Discharge Exam: Vitals:   11/21/16 0931 11/21/16 1100  BP: (!) 162/102 (!) 149/96  Pulse: 64   Resp: 18   Temp: 98.2 F (36.8 C)    Vitals:   11/21/16 0532 11/21/16 0855 11/21/16 0931 11/21/16 1100  BP: (!) 150/102 (!) 160/102 (!) 162/102 (!) 149/96  Pulse: 76 69 64   Resp: 18  18   Temp: 97.5 F (36.4 C)  98.2 F (36.8 C)   TempSrc: Oral  Oral   SpO2: 100%  100%   Weight:      Height:        General: Pt is alert, awake, not in acute distress Cardiovascular: RRR, S1/S2 +, no rubs, no gallops Respiratory: CTA bilaterally, no wheezing, no rhonchi Abdominal: Soft, NT, ND, bowel sounds + Extremities: no edema, no cyanosis    The results of significant diagnostics from this hospitalization (including imaging, microbiology, ancillary and laboratory) are listed below for reference.     Microbiology: Recent Results (from the past 240 hour(s))  MRSA PCR Screening     Status: None   Collection Time: 11/18/16  5:51 AM  Result Value Ref Range Status   MRSA by PCR NEGATIVE NEGATIVE Final    Comment:        The GeneXpert MRSA Assay (FDA approved for NASAL specimens only), is one component of a comprehensive MRSA colonization surveillance program. It is not intended to diagnose MRSA infection nor to guide or monitor treatment for MRSA infections.      Labs: BNP (last 3 results) No results for input(s): BNP in the last 8760 hours. Basic Metabolic Panel:  Recent Labs Lab 11/17/16 1947 11/18/16 0434 11/18/16 1141 11/18/16 1500 11/19/16 0712 11/20/16 1123  NA 135 139  --  137 135 135  K 4.6 5.2*  --  5.3* 4.5  4.7  CL 94* 97*  --  96* 95*  --   CO2 24 27  --  24 25  --   GLUCOSE 74 78  --  80 68 70  BUN 31* 42*  --  46* 32*  --   CREATININE 11.74* 12.76*  --  12.97* 10.86*  --   CALCIUM 9.8 9.2  --  9.2 9.2  --   PHOS  --   --  7.5* 6.0*  --   --    Liver Function Tests:  Recent Labs Lab 11/17/16 1947 11/18/16 1500 11/19/16 0712  AST 21  --  16  ALT 13*  --  9*  ALKPHOS 73  --  51  BILITOT  0.8  --  0.5  PROT 7.5  --  6.1*  ALBUMIN 4.2 3.5 3.4*    Recent Labs Lab 11/17/16 1947  LIPASE 23   No results for input(s): AMMONIA in the last 168 hours. CBC:  Recent Labs Lab 11/17/16 1947 11/18/16 0434 11/18/16 1500 11/19/16 0712 11/20/16 1123  WBC 10.2 10.0 11.0* 8.7  --   HGB 12.0 10.1* 10.9* 10.2* 9.2*  HCT 36.8 31.1* 33.2* 32.1* 27.0*  MCV 92.9 93.1 91.7 93.9  --   PLT 214 221 213 182  --    Cardiac Enzymes: No results for input(s): CKTOTAL, CKMB, CKMBINDEX, TROPONINI in the last 168 hours. BNP: Invalid input(s): POCBNP CBG: No results for input(s): GLUCAP in the last 168 hours. D-Dimer No results for input(s): DDIMER in the last 72 hours. Hgb A1c No results for input(s): HGBA1C in the last 72 hours. Lipid Profile No results for input(s): CHOL, HDL, LDLCALC, TRIG, CHOLHDL, LDLDIRECT in the last 72 hours. Thyroid function studies No results for input(s): TSH, T4TOTAL, T3FREE, THYROIDAB in the last 72 hours.  Invalid input(s): FREET3 Anemia work up No results for input(s): VITAMINB12, FOLATE, FERRITIN, TIBC, IRON, RETICCTPCT in the last 72 hours. Urinalysis    Component Value Date/Time   COLORURINE YELLOW 01/21/2015 1343   APPEARANCEUR HAZY (A) 01/21/2015 1343   LABSPEC 1.009 01/21/2015 1343   PHURINE 8.5 (H) 01/21/2015 1343   GLUCOSEU 100 (A) 01/21/2015 1343   HGBUR SMALL (A) 01/21/2015 1343   BILIRUBINUR NEGATIVE 01/21/2015 1343   KETONESUR 15 (A) 01/21/2015 1343   PROTEINUR >300 (A) 01/21/2015 1343   UROBILINOGEN 0.2 01/21/2015 1343   NITRITE NEGATIVE  01/21/2015 1343   LEUKOCYTESUR NEGATIVE 01/21/2015 1343   Sepsis Labs Invalid input(s): PROCALCITONIN,  WBC,  LACTICIDVEN Microbiology Recent Results (from the past 240 hour(s))  MRSA PCR Screening     Status: None   Collection Time: 11/18/16  5:51 AM  Result Value Ref Range Status   MRSA by PCR NEGATIVE NEGATIVE Final    Comment:        The GeneXpert MRSA Assay (FDA approved for NASAL specimens only), is one component of a comprehensive MRSA colonization surveillance program. It is not intended to diagnose MRSA infection nor to guide or monitor treatment for MRSA infections.      Time coordinating discharge: 28 minutes  SIGNED:   Rosita Fire, MD  [facility] 11/21/2016, 12:35 PM  If 7PM-7AM, please contact night-coverage [email or web address] Password [email or web address]

## 2016-11-21 NOTE — Progress Notes (Signed)
[facility] Progress Note  Interval History: 31 y.o. female with ESRD secondary to HTN, h/o failed renal transplant, gastric ulcer, PUD. Presented to [facility] ED with abdominal pain and vomiting. ED course also significant for markedly elevated BP. EGD 03/2016 with non-bleeding pre-pyloric and pyloric superficial gastric ulcers with benign pathology Patient with frequent visits to ED secondary to abdominal pain/gastritis with elevated BP.   Dialysis Orders:  [facility] MWF 3h45mins 180 F BFR 400/800 2K/2.25 Ca EDW 56.5kg  L AVF No Heparin Hectorol 4 mcg IV q HD Venofer 50 mg IV q wk Mircera 50 mcg IV q 2 wks (last 5/21)  BMM Ca acetate 2 q ac    Assessment/Plan: 1.  Abdominal pain/gastritis - Abd CT 5/28 with wall thickening associated with the duodenum and jejunum -GI following neg SB enteroscopy 5/30  2.  ESRD -  MWF - Cont on schedule. Next HD tomorrow at [facility]  3.  Hypertension/volume  - BP elevated/ clonidine increased resuming PO meds- ascites seen on abd CT 5/28  suggesting vol overload  Post HD wt 54.7kg plan lower EDW at discharge  4.  Anemia  - Hgb 9.2 Resume ESA as outpt  5.  Metabolic bone disease -   Cont VDRA/ P 6.0  Resume Ca acetate when eating  6.  Nutrition - Currently NPO Albumin 3.4 renal diet/vitamins    Subjective:  No abdominal pain this am. Feels good,eating. Likely D/C   Objective Vitals:   11/21/16 0532 11/21/16 0855 11/21/16 0931 11/21/16 1100  BP: (!) 150/102 (!) 160/102 (!) 162/102 (!) 149/96  Pulse: 76 69 64   Resp: 18  18   Temp: 97.5 F (36.4 C)  98.2 F (36.8 C)   TempSrc: Oral  Oral   SpO2: 100%  100%   Weight:      Height:       Physical Exam General: WNWD AAF NAD Heart: RRR Lungs: CTAB Abdomen: bs+ soft NT/ND Extremities: no LE edema  Dialysis Access: LUE AVF +bruit   Additional Objective Labs: Basic Metabolic Panel:  Recent Labs Lab 11/18/16 0434 11/18/16 1141 11/18/16 1500 11/19/16 0712 11/20/16 1123  NA 139   --  137 135 135  K 5.2*  --  5.3* 4.5 4.7  CL 97*  --  96* 95*  --   CO2 27  --  24 25  --   GLUCOSE 78  --  80 68 70  BUN 42*  --  46* 32*  --   CREATININE 12.76*  --  12.97* 10.86*  --   CALCIUM 9.2  --  9.2 9.2  --   PHOS  --  7.5* 6.0*  --   --    Liver Function Tests:  Recent Labs Lab 11/17/16 1947 11/18/16 1500 11/19/16 0712  AST 21  --  16  ALT 13*  --  9*  ALKPHOS 73  --  51  BILITOT 0.8  --  0.5  PROT 7.5  --  6.1*  ALBUMIN 4.2 3.5 3.4*    Recent Labs Lab 11/17/16 1947  LIPASE 23   CBC:  Recent Labs Lab 11/17/16 1947 11/18/16 0434 11/18/16 1500 11/19/16 0712 11/20/16 1123  WBC 10.2 10.0 11.0* 8.7  --   HGB 12.0 10.1* 10.9* 10.2* 9.2*  HCT 36.8 31.1* 33.2* 32.1* 27.0*  MCV 92.9 93.1 91.7 93.9  --   PLT 214 221 213 182  --    Blood Culture    Component Value Date/Time  SDES URINE, CLEAN CATCH 06/26/2014 2035   SPECREQUEST None 06/26/2014 2035   CULT  06/26/2014 2035    LACTOBACILLUS SPECIES Note: Standardized susceptibility testing for this organism is not available. Performed at [place] 06/28/2014 FINAL 06/26/2014 2035    Cardiac Enzymes: No results for input(s): CKTOTAL, CKMB, CKMBINDEX, TROPONINI in the last 168 hours. CBG: No results for input(s): GLUCAP in the last 168 hours. Iron Studies: No results for input(s): IRON, TIBC, TRANSFERRIN, FERRITIN in the last 72 hours. Lab Results  Component Value Date   INR 1.0 09/03/2007   INR 1.0 09/02/2007   INR 1.0 08/26/2007   Medications:  . amLODipine  10 mg Oral Daily  . calcium acetate  1,334 mg Oral TID WC  . cloNIDine  0.2 mg Oral TID  . docusate sodium  100 mg Oral BID  . doxercalciferol  4 mcg Intravenous Q M,W,F-HD  . enoxaparin (LOVENOX) injection  30 mg Subcutaneous Daily  . losartan  100 mg Oral QPM  . multivitamin  1 tablet Oral QHS  . pantoprazole (PROTONIX) IV  40 mg Intravenous Q12H  . sodium chloride flush  3 mL Intravenous Q12H    Lynnda Child PA-C [place] Pager [phone] 11/20/2016,9:53 AM  LOS: 2 days    Renal Attending: I agree with note above.  Her BP remains high and she will need the addition of another agent soon. Amairani Shuey C

## 2016-11-22 DIAGNOSIS — N186 End stage renal disease: Secondary | ICD-10-CM | POA: Diagnosis not present

## 2016-11-22 DIAGNOSIS — N2581 Secondary hyperparathyroidism of renal origin: Secondary | ICD-10-CM | POA: Diagnosis not present

## 2016-11-22 DIAGNOSIS — D631 Anemia in chronic kidney disease: Secondary | ICD-10-CM | POA: Diagnosis not present

## 2016-11-22 DIAGNOSIS — D509 Iron deficiency anemia, unspecified: Secondary | ICD-10-CM | POA: Diagnosis not present

## 2016-11-25 DIAGNOSIS — N186 End stage renal disease: Secondary | ICD-10-CM | POA: Diagnosis not present

## 2016-11-25 DIAGNOSIS — D631 Anemia in chronic kidney disease: Secondary | ICD-10-CM | POA: Diagnosis not present

## 2016-11-25 DIAGNOSIS — N2581 Secondary hyperparathyroidism of renal origin: Secondary | ICD-10-CM | POA: Diagnosis not present

## 2016-11-25 DIAGNOSIS — D509 Iron deficiency anemia, unspecified: Secondary | ICD-10-CM | POA: Diagnosis not present

## 2016-11-27 DIAGNOSIS — D631 Anemia in chronic kidney disease: Secondary | ICD-10-CM | POA: Diagnosis not present

## 2016-11-27 DIAGNOSIS — N186 End stage renal disease: Secondary | ICD-10-CM | POA: Diagnosis not present

## 2016-11-27 DIAGNOSIS — N2581 Secondary hyperparathyroidism of renal origin: Secondary | ICD-10-CM | POA: Diagnosis not present

## 2016-11-27 DIAGNOSIS — D509 Iron deficiency anemia, unspecified: Secondary | ICD-10-CM | POA: Diagnosis not present

## 2016-11-29 DIAGNOSIS — D631 Anemia in chronic kidney disease: Secondary | ICD-10-CM | POA: Diagnosis not present

## 2016-11-29 DIAGNOSIS — N186 End stage renal disease: Secondary | ICD-10-CM | POA: Diagnosis not present

## 2016-11-29 DIAGNOSIS — D509 Iron deficiency anemia, unspecified: Secondary | ICD-10-CM | POA: Diagnosis not present

## 2016-11-29 DIAGNOSIS — N2581 Secondary hyperparathyroidism of renal origin: Secondary | ICD-10-CM | POA: Diagnosis not present

## 2016-12-02 DIAGNOSIS — D509 Iron deficiency anemia, unspecified: Secondary | ICD-10-CM | POA: Diagnosis not present

## 2016-12-02 DIAGNOSIS — N186 End stage renal disease: Secondary | ICD-10-CM | POA: Diagnosis not present

## 2016-12-02 DIAGNOSIS — D631 Anemia in chronic kidney disease: Secondary | ICD-10-CM | POA: Diagnosis not present

## 2016-12-02 DIAGNOSIS — N2581 Secondary hyperparathyroidism of renal origin: Secondary | ICD-10-CM | POA: Diagnosis not present

## 2016-12-04 DIAGNOSIS — D631 Anemia in chronic kidney disease: Secondary | ICD-10-CM | POA: Diagnosis not present

## 2016-12-04 DIAGNOSIS — D509 Iron deficiency anemia, unspecified: Secondary | ICD-10-CM | POA: Diagnosis not present

## 2016-12-04 DIAGNOSIS — N2581 Secondary hyperparathyroidism of renal origin: Secondary | ICD-10-CM | POA: Diagnosis not present

## 2016-12-04 DIAGNOSIS — N186 End stage renal disease: Secondary | ICD-10-CM | POA: Diagnosis not present

## 2016-12-06 DIAGNOSIS — D509 Iron deficiency anemia, unspecified: Secondary | ICD-10-CM | POA: Diagnosis not present

## 2016-12-06 DIAGNOSIS — D631 Anemia in chronic kidney disease: Secondary | ICD-10-CM | POA: Diagnosis not present

## 2016-12-06 DIAGNOSIS — N2581 Secondary hyperparathyroidism of renal origin: Secondary | ICD-10-CM | POA: Diagnosis not present

## 2016-12-06 DIAGNOSIS — N186 End stage renal disease: Secondary | ICD-10-CM | POA: Diagnosis not present

## 2016-12-09 DIAGNOSIS — D631 Anemia in chronic kidney disease: Secondary | ICD-10-CM | POA: Diagnosis not present

## 2016-12-09 DIAGNOSIS — D509 Iron deficiency anemia, unspecified: Secondary | ICD-10-CM | POA: Diagnosis not present

## 2016-12-09 DIAGNOSIS — N186 End stage renal disease: Secondary | ICD-10-CM | POA: Diagnosis not present

## 2016-12-09 DIAGNOSIS — N2581 Secondary hyperparathyroidism of renal origin: Secondary | ICD-10-CM | POA: Diagnosis not present

## 2016-12-10 DIAGNOSIS — N186 End stage renal disease: Secondary | ICD-10-CM | POA: Diagnosis not present

## 2016-12-10 DIAGNOSIS — N2581 Secondary hyperparathyroidism of renal origin: Secondary | ICD-10-CM | POA: Diagnosis not present

## 2016-12-10 DIAGNOSIS — D509 Iron deficiency anemia, unspecified: Secondary | ICD-10-CM | POA: Diagnosis not present

## 2016-12-10 DIAGNOSIS — D631 Anemia in chronic kidney disease: Secondary | ICD-10-CM | POA: Diagnosis not present

## 2016-12-14 DIAGNOSIS — N186 End stage renal disease: Secondary | ICD-10-CM | POA: Diagnosis not present

## 2016-12-14 DIAGNOSIS — N2581 Secondary hyperparathyroidism of renal origin: Secondary | ICD-10-CM | POA: Diagnosis not present

## 2016-12-14 DIAGNOSIS — D631 Anemia in chronic kidney disease: Secondary | ICD-10-CM | POA: Diagnosis not present

## 2016-12-14 DIAGNOSIS — D509 Iron deficiency anemia, unspecified: Secondary | ICD-10-CM | POA: Diagnosis not present

## 2016-12-16 DIAGNOSIS — D631 Anemia in chronic kidney disease: Secondary | ICD-10-CM | POA: Diagnosis not present

## 2016-12-16 DIAGNOSIS — N186 End stage renal disease: Secondary | ICD-10-CM | POA: Diagnosis not present

## 2016-12-16 DIAGNOSIS — N2581 Secondary hyperparathyroidism of renal origin: Secondary | ICD-10-CM | POA: Diagnosis not present

## 2016-12-16 DIAGNOSIS — D509 Iron deficiency anemia, unspecified: Secondary | ICD-10-CM | POA: Diagnosis not present

## 2016-12-18 DIAGNOSIS — D509 Iron deficiency anemia, unspecified: Secondary | ICD-10-CM | POA: Diagnosis not present

## 2016-12-18 DIAGNOSIS — N186 End stage renal disease: Secondary | ICD-10-CM | POA: Diagnosis not present

## 2016-12-18 DIAGNOSIS — N2581 Secondary hyperparathyroidism of renal origin: Secondary | ICD-10-CM | POA: Diagnosis not present

## 2016-12-18 DIAGNOSIS — D631 Anemia in chronic kidney disease: Secondary | ICD-10-CM | POA: Diagnosis not present

## 2016-12-20 DIAGNOSIS — N186 End stage renal disease: Secondary | ICD-10-CM | POA: Diagnosis not present

## 2016-12-20 DIAGNOSIS — N2581 Secondary hyperparathyroidism of renal origin: Secondary | ICD-10-CM | POA: Diagnosis not present

## 2016-12-20 DIAGNOSIS — D509 Iron deficiency anemia, unspecified: Secondary | ICD-10-CM | POA: Diagnosis not present

## 2016-12-20 DIAGNOSIS — D631 Anemia in chronic kidney disease: Secondary | ICD-10-CM | POA: Diagnosis not present

## 2016-12-21 DIAGNOSIS — T8610 Unspecified complication of kidney transplant: Secondary | ICD-10-CM | POA: Diagnosis not present

## 2016-12-21 DIAGNOSIS — N186 End stage renal disease: Secondary | ICD-10-CM | POA: Diagnosis not present

## 2016-12-21 DIAGNOSIS — Z992 Dependence on renal dialysis: Secondary | ICD-10-CM | POA: Diagnosis not present

## 2016-12-23 DIAGNOSIS — N2581 Secondary hyperparathyroidism of renal origin: Secondary | ICD-10-CM | POA: Diagnosis not present

## 2016-12-23 DIAGNOSIS — Z23 Encounter for immunization: Secondary | ICD-10-CM | POA: Diagnosis not present

## 2016-12-23 DIAGNOSIS — N186 End stage renal disease: Secondary | ICD-10-CM | POA: Diagnosis not present

## 2016-12-23 DIAGNOSIS — D509 Iron deficiency anemia, unspecified: Secondary | ICD-10-CM | POA: Diagnosis not present

## 2016-12-23 DIAGNOSIS — D631 Anemia in chronic kidney disease: Secondary | ICD-10-CM | POA: Diagnosis not present

## 2016-12-25 DIAGNOSIS — Z23 Encounter for immunization: Secondary | ICD-10-CM | POA: Diagnosis not present

## 2016-12-25 DIAGNOSIS — D631 Anemia in chronic kidney disease: Secondary | ICD-10-CM | POA: Diagnosis not present

## 2016-12-25 DIAGNOSIS — D509 Iron deficiency anemia, unspecified: Secondary | ICD-10-CM | POA: Diagnosis not present

## 2016-12-25 DIAGNOSIS — N2581 Secondary hyperparathyroidism of renal origin: Secondary | ICD-10-CM | POA: Diagnosis not present

## 2016-12-25 DIAGNOSIS — N186 End stage renal disease: Secondary | ICD-10-CM | POA: Diagnosis not present

## 2016-12-27 DIAGNOSIS — N186 End stage renal disease: Secondary | ICD-10-CM | POA: Diagnosis not present

## 2016-12-27 DIAGNOSIS — D631 Anemia in chronic kidney disease: Secondary | ICD-10-CM | POA: Diagnosis not present

## 2016-12-27 DIAGNOSIS — Z23 Encounter for immunization: Secondary | ICD-10-CM | POA: Diagnosis not present

## 2016-12-27 DIAGNOSIS — D509 Iron deficiency anemia, unspecified: Secondary | ICD-10-CM | POA: Diagnosis not present

## 2016-12-27 DIAGNOSIS — N2581 Secondary hyperparathyroidism of renal origin: Secondary | ICD-10-CM | POA: Diagnosis not present

## 2016-12-30 DIAGNOSIS — N2581 Secondary hyperparathyroidism of renal origin: Secondary | ICD-10-CM | POA: Diagnosis not present

## 2016-12-30 DIAGNOSIS — Z23 Encounter for immunization: Secondary | ICD-10-CM | POA: Diagnosis not present

## 2016-12-30 DIAGNOSIS — D631 Anemia in chronic kidney disease: Secondary | ICD-10-CM | POA: Diagnosis not present

## 2016-12-30 DIAGNOSIS — N186 End stage renal disease: Secondary | ICD-10-CM | POA: Diagnosis not present

## 2016-12-30 DIAGNOSIS — D509 Iron deficiency anemia, unspecified: Secondary | ICD-10-CM | POA: Diagnosis not present

## 2017-01-01 DIAGNOSIS — Z23 Encounter for immunization: Secondary | ICD-10-CM | POA: Diagnosis not present

## 2017-01-01 DIAGNOSIS — N2581 Secondary hyperparathyroidism of renal origin: Secondary | ICD-10-CM | POA: Diagnosis not present

## 2017-01-01 DIAGNOSIS — N186 End stage renal disease: Secondary | ICD-10-CM | POA: Diagnosis not present

## 2017-01-01 DIAGNOSIS — D509 Iron deficiency anemia, unspecified: Secondary | ICD-10-CM | POA: Diagnosis not present

## 2017-01-01 DIAGNOSIS — D631 Anemia in chronic kidney disease: Secondary | ICD-10-CM | POA: Diagnosis not present

## 2017-01-03 DIAGNOSIS — D509 Iron deficiency anemia, unspecified: Secondary | ICD-10-CM | POA: Diagnosis not present

## 2017-01-03 DIAGNOSIS — Z23 Encounter for immunization: Secondary | ICD-10-CM | POA: Diagnosis not present

## 2017-01-03 DIAGNOSIS — N2581 Secondary hyperparathyroidism of renal origin: Secondary | ICD-10-CM | POA: Diagnosis not present

## 2017-01-03 DIAGNOSIS — N186 End stage renal disease: Secondary | ICD-10-CM | POA: Diagnosis not present

## 2017-01-03 DIAGNOSIS — D631 Anemia in chronic kidney disease: Secondary | ICD-10-CM | POA: Diagnosis not present

## 2017-01-06 DIAGNOSIS — N2581 Secondary hyperparathyroidism of renal origin: Secondary | ICD-10-CM | POA: Diagnosis not present

## 2017-01-06 DIAGNOSIS — Z23 Encounter for immunization: Secondary | ICD-10-CM | POA: Diagnosis not present

## 2017-01-06 DIAGNOSIS — D509 Iron deficiency anemia, unspecified: Secondary | ICD-10-CM | POA: Diagnosis not present

## 2017-01-06 DIAGNOSIS — D631 Anemia in chronic kidney disease: Secondary | ICD-10-CM | POA: Diagnosis not present

## 2017-01-06 DIAGNOSIS — N186 End stage renal disease: Secondary | ICD-10-CM | POA: Diagnosis not present

## 2017-01-08 DIAGNOSIS — D631 Anemia in chronic kidney disease: Secondary | ICD-10-CM | POA: Diagnosis not present

## 2017-01-08 DIAGNOSIS — N2581 Secondary hyperparathyroidism of renal origin: Secondary | ICD-10-CM | POA: Diagnosis not present

## 2017-01-08 DIAGNOSIS — D509 Iron deficiency anemia, unspecified: Secondary | ICD-10-CM | POA: Diagnosis not present

## 2017-01-08 DIAGNOSIS — Z23 Encounter for immunization: Secondary | ICD-10-CM | POA: Diagnosis not present

## 2017-01-08 DIAGNOSIS — N186 End stage renal disease: Secondary | ICD-10-CM | POA: Diagnosis not present

## 2017-01-10 ENCOUNTER — Ambulatory Visit: Payer: Medicare Other | Admitting: Internal Medicine

## 2017-01-10 DIAGNOSIS — D509 Iron deficiency anemia, unspecified: Secondary | ICD-10-CM | POA: Diagnosis not present

## 2017-01-10 DIAGNOSIS — N186 End stage renal disease: Secondary | ICD-10-CM | POA: Diagnosis not present

## 2017-01-10 DIAGNOSIS — D631 Anemia in chronic kidney disease: Secondary | ICD-10-CM | POA: Diagnosis not present

## 2017-01-10 DIAGNOSIS — Z23 Encounter for immunization: Secondary | ICD-10-CM | POA: Diagnosis not present

## 2017-01-10 DIAGNOSIS — N2581 Secondary hyperparathyroidism of renal origin: Secondary | ICD-10-CM | POA: Diagnosis not present

## 2017-01-13 DIAGNOSIS — D509 Iron deficiency anemia, unspecified: Secondary | ICD-10-CM | POA: Diagnosis not present

## 2017-01-13 DIAGNOSIS — N2581 Secondary hyperparathyroidism of renal origin: Secondary | ICD-10-CM | POA: Diagnosis not present

## 2017-01-13 DIAGNOSIS — N186 End stage renal disease: Secondary | ICD-10-CM | POA: Diagnosis not present

## 2017-01-13 DIAGNOSIS — Z23 Encounter for immunization: Secondary | ICD-10-CM | POA: Diagnosis not present

## 2017-01-13 DIAGNOSIS — D631 Anemia in chronic kidney disease: Secondary | ICD-10-CM | POA: Diagnosis not present

## 2017-01-15 DIAGNOSIS — N186 End stage renal disease: Secondary | ICD-10-CM | POA: Diagnosis not present

## 2017-01-15 DIAGNOSIS — N2581 Secondary hyperparathyroidism of renal origin: Secondary | ICD-10-CM | POA: Diagnosis not present

## 2017-01-15 DIAGNOSIS — Z23 Encounter for immunization: Secondary | ICD-10-CM | POA: Diagnosis not present

## 2017-01-15 DIAGNOSIS — D509 Iron deficiency anemia, unspecified: Secondary | ICD-10-CM | POA: Diagnosis not present

## 2017-01-15 DIAGNOSIS — D631 Anemia in chronic kidney disease: Secondary | ICD-10-CM | POA: Diagnosis not present

## 2017-01-17 DIAGNOSIS — D631 Anemia in chronic kidney disease: Secondary | ICD-10-CM | POA: Diagnosis not present

## 2017-01-17 DIAGNOSIS — N2581 Secondary hyperparathyroidism of renal origin: Secondary | ICD-10-CM | POA: Diagnosis not present

## 2017-01-17 DIAGNOSIS — N186 End stage renal disease: Secondary | ICD-10-CM | POA: Diagnosis not present

## 2017-01-17 DIAGNOSIS — Z23 Encounter for immunization: Secondary | ICD-10-CM | POA: Diagnosis not present

## 2017-01-17 DIAGNOSIS — D509 Iron deficiency anemia, unspecified: Secondary | ICD-10-CM | POA: Diagnosis not present

## 2017-01-20 DIAGNOSIS — N2581 Secondary hyperparathyroidism of renal origin: Secondary | ICD-10-CM | POA: Diagnosis not present

## 2017-01-20 DIAGNOSIS — D509 Iron deficiency anemia, unspecified: Secondary | ICD-10-CM | POA: Diagnosis not present

## 2017-01-20 DIAGNOSIS — D631 Anemia in chronic kidney disease: Secondary | ICD-10-CM | POA: Diagnosis not present

## 2017-01-20 DIAGNOSIS — Z23 Encounter for immunization: Secondary | ICD-10-CM | POA: Diagnosis not present

## 2017-01-20 DIAGNOSIS — N186 End stage renal disease: Secondary | ICD-10-CM | POA: Diagnosis not present

## 2017-01-21 DIAGNOSIS — T8610 Unspecified complication of kidney transplant: Secondary | ICD-10-CM | POA: Diagnosis not present

## 2017-01-21 DIAGNOSIS — N186 End stage renal disease: Secondary | ICD-10-CM | POA: Diagnosis not present

## 2017-01-21 DIAGNOSIS — Z992 Dependence on renal dialysis: Secondary | ICD-10-CM | POA: Diagnosis not present

## 2017-01-22 DIAGNOSIS — N2581 Secondary hyperparathyroidism of renal origin: Secondary | ICD-10-CM | POA: Diagnosis not present

## 2017-01-22 DIAGNOSIS — D631 Anemia in chronic kidney disease: Secondary | ICD-10-CM | POA: Diagnosis not present

## 2017-01-22 DIAGNOSIS — N186 End stage renal disease: Secondary | ICD-10-CM | POA: Diagnosis not present

## 2017-01-22 DIAGNOSIS — D509 Iron deficiency anemia, unspecified: Secondary | ICD-10-CM | POA: Diagnosis not present

## 2017-01-24 DIAGNOSIS — N2581 Secondary hyperparathyroidism of renal origin: Secondary | ICD-10-CM | POA: Diagnosis not present

## 2017-01-24 DIAGNOSIS — N186 End stage renal disease: Secondary | ICD-10-CM | POA: Diagnosis not present

## 2017-01-24 DIAGNOSIS — D509 Iron deficiency anemia, unspecified: Secondary | ICD-10-CM | POA: Diagnosis not present

## 2017-01-24 DIAGNOSIS — D631 Anemia in chronic kidney disease: Secondary | ICD-10-CM | POA: Diagnosis not present

## 2017-01-27 DIAGNOSIS — D631 Anemia in chronic kidney disease: Secondary | ICD-10-CM | POA: Diagnosis not present

## 2017-01-27 DIAGNOSIS — N2581 Secondary hyperparathyroidism of renal origin: Secondary | ICD-10-CM | POA: Diagnosis not present

## 2017-01-27 DIAGNOSIS — D509 Iron deficiency anemia, unspecified: Secondary | ICD-10-CM | POA: Diagnosis not present

## 2017-01-27 DIAGNOSIS — N186 End stage renal disease: Secondary | ICD-10-CM | POA: Diagnosis not present

## 2017-01-29 DIAGNOSIS — N186 End stage renal disease: Secondary | ICD-10-CM | POA: Diagnosis not present

## 2017-01-29 DIAGNOSIS — D631 Anemia in chronic kidney disease: Secondary | ICD-10-CM | POA: Diagnosis not present

## 2017-01-29 DIAGNOSIS — N2581 Secondary hyperparathyroidism of renal origin: Secondary | ICD-10-CM | POA: Diagnosis not present

## 2017-01-29 DIAGNOSIS — D509 Iron deficiency anemia, unspecified: Secondary | ICD-10-CM | POA: Diagnosis not present

## 2017-01-31 DIAGNOSIS — N186 End stage renal disease: Secondary | ICD-10-CM | POA: Diagnosis not present

## 2017-01-31 DIAGNOSIS — N2581 Secondary hyperparathyroidism of renal origin: Secondary | ICD-10-CM | POA: Diagnosis not present

## 2017-01-31 DIAGNOSIS — D631 Anemia in chronic kidney disease: Secondary | ICD-10-CM | POA: Diagnosis not present

## 2017-01-31 DIAGNOSIS — D509 Iron deficiency anemia, unspecified: Secondary | ICD-10-CM | POA: Diagnosis not present

## 2017-02-03 DIAGNOSIS — D631 Anemia in chronic kidney disease: Secondary | ICD-10-CM | POA: Diagnosis not present

## 2017-02-03 DIAGNOSIS — N2581 Secondary hyperparathyroidism of renal origin: Secondary | ICD-10-CM | POA: Diagnosis not present

## 2017-02-03 DIAGNOSIS — D509 Iron deficiency anemia, unspecified: Secondary | ICD-10-CM | POA: Diagnosis not present

## 2017-02-03 DIAGNOSIS — N186 End stage renal disease: Secondary | ICD-10-CM | POA: Diagnosis not present

## 2017-02-05 DIAGNOSIS — D631 Anemia in chronic kidney disease: Secondary | ICD-10-CM | POA: Diagnosis not present

## 2017-02-05 DIAGNOSIS — D509 Iron deficiency anemia, unspecified: Secondary | ICD-10-CM | POA: Diagnosis not present

## 2017-02-05 DIAGNOSIS — N186 End stage renal disease: Secondary | ICD-10-CM | POA: Diagnosis not present

## 2017-02-05 DIAGNOSIS — N2581 Secondary hyperparathyroidism of renal origin: Secondary | ICD-10-CM | POA: Diagnosis not present

## 2017-02-07 DIAGNOSIS — D509 Iron deficiency anemia, unspecified: Secondary | ICD-10-CM | POA: Diagnosis not present

## 2017-02-07 DIAGNOSIS — D631 Anemia in chronic kidney disease: Secondary | ICD-10-CM | POA: Diagnosis not present

## 2017-02-07 DIAGNOSIS — N2581 Secondary hyperparathyroidism of renal origin: Secondary | ICD-10-CM | POA: Diagnosis not present

## 2017-02-07 DIAGNOSIS — N186 End stage renal disease: Secondary | ICD-10-CM | POA: Diagnosis not present

## 2017-02-10 DIAGNOSIS — D631 Anemia in chronic kidney disease: Secondary | ICD-10-CM | POA: Diagnosis not present

## 2017-02-10 DIAGNOSIS — N186 End stage renal disease: Secondary | ICD-10-CM | POA: Diagnosis not present

## 2017-02-10 DIAGNOSIS — D509 Iron deficiency anemia, unspecified: Secondary | ICD-10-CM | POA: Diagnosis not present

## 2017-02-10 DIAGNOSIS — N2581 Secondary hyperparathyroidism of renal origin: Secondary | ICD-10-CM | POA: Diagnosis not present

## 2017-02-12 DIAGNOSIS — N2581 Secondary hyperparathyroidism of renal origin: Secondary | ICD-10-CM | POA: Diagnosis not present

## 2017-02-12 DIAGNOSIS — N186 End stage renal disease: Secondary | ICD-10-CM | POA: Diagnosis not present

## 2017-02-12 DIAGNOSIS — D631 Anemia in chronic kidney disease: Secondary | ICD-10-CM | POA: Diagnosis not present

## 2017-02-12 DIAGNOSIS — D509 Iron deficiency anemia, unspecified: Secondary | ICD-10-CM | POA: Diagnosis not present

## 2017-02-14 DIAGNOSIS — D631 Anemia in chronic kidney disease: Secondary | ICD-10-CM | POA: Diagnosis not present

## 2017-02-14 DIAGNOSIS — N186 End stage renal disease: Secondary | ICD-10-CM | POA: Diagnosis not present

## 2017-02-14 DIAGNOSIS — N2581 Secondary hyperparathyroidism of renal origin: Secondary | ICD-10-CM | POA: Diagnosis not present

## 2017-02-14 DIAGNOSIS — D509 Iron deficiency anemia, unspecified: Secondary | ICD-10-CM | POA: Diagnosis not present

## 2017-02-17 DIAGNOSIS — D631 Anemia in chronic kidney disease: Secondary | ICD-10-CM | POA: Diagnosis not present

## 2017-02-17 DIAGNOSIS — N186 End stage renal disease: Secondary | ICD-10-CM | POA: Diagnosis not present

## 2017-02-17 DIAGNOSIS — D509 Iron deficiency anemia, unspecified: Secondary | ICD-10-CM | POA: Diagnosis not present

## 2017-02-17 DIAGNOSIS — N2581 Secondary hyperparathyroidism of renal origin: Secondary | ICD-10-CM | POA: Diagnosis not present

## 2017-02-19 DIAGNOSIS — D509 Iron deficiency anemia, unspecified: Secondary | ICD-10-CM | POA: Diagnosis not present

## 2017-02-19 DIAGNOSIS — N186 End stage renal disease: Secondary | ICD-10-CM | POA: Diagnosis not present

## 2017-02-19 DIAGNOSIS — D631 Anemia in chronic kidney disease: Secondary | ICD-10-CM | POA: Diagnosis not present

## 2017-02-19 DIAGNOSIS — N2581 Secondary hyperparathyroidism of renal origin: Secondary | ICD-10-CM | POA: Diagnosis not present

## 2017-02-21 DIAGNOSIS — T8610 Unspecified complication of kidney transplant: Secondary | ICD-10-CM | POA: Diagnosis not present

## 2017-02-21 DIAGNOSIS — Z992 Dependence on renal dialysis: Secondary | ICD-10-CM | POA: Diagnosis not present

## 2017-02-21 DIAGNOSIS — D631 Anemia in chronic kidney disease: Secondary | ICD-10-CM | POA: Diagnosis not present

## 2017-02-21 DIAGNOSIS — N186 End stage renal disease: Secondary | ICD-10-CM | POA: Diagnosis not present

## 2017-02-21 DIAGNOSIS — N2581 Secondary hyperparathyroidism of renal origin: Secondary | ICD-10-CM | POA: Diagnosis not present

## 2017-02-21 DIAGNOSIS — D509 Iron deficiency anemia, unspecified: Secondary | ICD-10-CM | POA: Diagnosis not present

## 2017-02-24 DIAGNOSIS — D631 Anemia in chronic kidney disease: Secondary | ICD-10-CM | POA: Diagnosis not present

## 2017-02-24 DIAGNOSIS — N186 End stage renal disease: Secondary | ICD-10-CM | POA: Diagnosis not present

## 2017-02-24 DIAGNOSIS — D509 Iron deficiency anemia, unspecified: Secondary | ICD-10-CM | POA: Diagnosis not present

## 2017-02-24 DIAGNOSIS — N2581 Secondary hyperparathyroidism of renal origin: Secondary | ICD-10-CM | POA: Diagnosis not present

## 2017-02-24 DIAGNOSIS — Z23 Encounter for immunization: Secondary | ICD-10-CM | POA: Diagnosis not present

## 2017-02-26 DIAGNOSIS — N186 End stage renal disease: Secondary | ICD-10-CM | POA: Diagnosis not present

## 2017-02-26 DIAGNOSIS — D509 Iron deficiency anemia, unspecified: Secondary | ICD-10-CM | POA: Diagnosis not present

## 2017-02-26 DIAGNOSIS — Z23 Encounter for immunization: Secondary | ICD-10-CM | POA: Diagnosis not present

## 2017-02-26 DIAGNOSIS — D631 Anemia in chronic kidney disease: Secondary | ICD-10-CM | POA: Diagnosis not present

## 2017-02-26 DIAGNOSIS — N2581 Secondary hyperparathyroidism of renal origin: Secondary | ICD-10-CM | POA: Diagnosis not present

## 2017-02-28 DIAGNOSIS — D509 Iron deficiency anemia, unspecified: Secondary | ICD-10-CM | POA: Diagnosis not present

## 2017-02-28 DIAGNOSIS — N186 End stage renal disease: Secondary | ICD-10-CM | POA: Diagnosis not present

## 2017-02-28 DIAGNOSIS — D631 Anemia in chronic kidney disease: Secondary | ICD-10-CM | POA: Diagnosis not present

## 2017-02-28 DIAGNOSIS — N2581 Secondary hyperparathyroidism of renal origin: Secondary | ICD-10-CM | POA: Diagnosis not present

## 2017-02-28 DIAGNOSIS — Z23 Encounter for immunization: Secondary | ICD-10-CM | POA: Diagnosis not present

## 2017-03-03 DIAGNOSIS — N186 End stage renal disease: Secondary | ICD-10-CM | POA: Diagnosis not present

## 2017-03-03 DIAGNOSIS — Z23 Encounter for immunization: Secondary | ICD-10-CM | POA: Diagnosis not present

## 2017-03-03 DIAGNOSIS — D631 Anemia in chronic kidney disease: Secondary | ICD-10-CM | POA: Diagnosis not present

## 2017-03-03 DIAGNOSIS — N2581 Secondary hyperparathyroidism of renal origin: Secondary | ICD-10-CM | POA: Diagnosis not present

## 2017-03-03 DIAGNOSIS — D509 Iron deficiency anemia, unspecified: Secondary | ICD-10-CM | POA: Diagnosis not present

## 2017-03-05 DIAGNOSIS — N2581 Secondary hyperparathyroidism of renal origin: Secondary | ICD-10-CM | POA: Diagnosis not present

## 2017-03-05 DIAGNOSIS — Z23 Encounter for immunization: Secondary | ICD-10-CM | POA: Diagnosis not present

## 2017-03-05 DIAGNOSIS — D509 Iron deficiency anemia, unspecified: Secondary | ICD-10-CM | POA: Diagnosis not present

## 2017-03-05 DIAGNOSIS — N186 End stage renal disease: Secondary | ICD-10-CM | POA: Diagnosis not present

## 2017-03-05 DIAGNOSIS — D631 Anemia in chronic kidney disease: Secondary | ICD-10-CM | POA: Diagnosis not present

## 2017-03-07 DIAGNOSIS — D509 Iron deficiency anemia, unspecified: Secondary | ICD-10-CM | POA: Diagnosis not present

## 2017-03-07 DIAGNOSIS — N186 End stage renal disease: Secondary | ICD-10-CM | POA: Diagnosis not present

## 2017-03-07 DIAGNOSIS — Z23 Encounter for immunization: Secondary | ICD-10-CM | POA: Diagnosis not present

## 2017-03-07 DIAGNOSIS — D631 Anemia in chronic kidney disease: Secondary | ICD-10-CM | POA: Diagnosis not present

## 2017-03-07 DIAGNOSIS — N2581 Secondary hyperparathyroidism of renal origin: Secondary | ICD-10-CM | POA: Diagnosis not present

## 2017-03-10 DIAGNOSIS — N2581 Secondary hyperparathyroidism of renal origin: Secondary | ICD-10-CM | POA: Diagnosis not present

## 2017-03-10 DIAGNOSIS — Z23 Encounter for immunization: Secondary | ICD-10-CM | POA: Diagnosis not present

## 2017-03-10 DIAGNOSIS — D631 Anemia in chronic kidney disease: Secondary | ICD-10-CM | POA: Diagnosis not present

## 2017-03-10 DIAGNOSIS — N186 End stage renal disease: Secondary | ICD-10-CM | POA: Diagnosis not present

## 2017-03-10 DIAGNOSIS — D509 Iron deficiency anemia, unspecified: Secondary | ICD-10-CM | POA: Diagnosis not present

## 2017-03-12 DIAGNOSIS — N2581 Secondary hyperparathyroidism of renal origin: Secondary | ICD-10-CM | POA: Diagnosis not present

## 2017-03-12 DIAGNOSIS — D631 Anemia in chronic kidney disease: Secondary | ICD-10-CM | POA: Diagnosis not present

## 2017-03-12 DIAGNOSIS — N186 End stage renal disease: Secondary | ICD-10-CM | POA: Diagnosis not present

## 2017-03-12 DIAGNOSIS — D509 Iron deficiency anemia, unspecified: Secondary | ICD-10-CM | POA: Diagnosis not present

## 2017-03-12 DIAGNOSIS — Z23 Encounter for immunization: Secondary | ICD-10-CM | POA: Diagnosis not present

## 2017-03-14 DIAGNOSIS — N2581 Secondary hyperparathyroidism of renal origin: Secondary | ICD-10-CM | POA: Diagnosis not present

## 2017-03-14 DIAGNOSIS — D509 Iron deficiency anemia, unspecified: Secondary | ICD-10-CM | POA: Diagnosis not present

## 2017-03-14 DIAGNOSIS — D631 Anemia in chronic kidney disease: Secondary | ICD-10-CM | POA: Diagnosis not present

## 2017-03-14 DIAGNOSIS — Z23 Encounter for immunization: Secondary | ICD-10-CM | POA: Diagnosis not present

## 2017-03-14 DIAGNOSIS — N186 End stage renal disease: Secondary | ICD-10-CM | POA: Diagnosis not present

## 2017-03-17 DIAGNOSIS — D631 Anemia in chronic kidney disease: Secondary | ICD-10-CM | POA: Diagnosis not present

## 2017-03-17 DIAGNOSIS — N186 End stage renal disease: Secondary | ICD-10-CM | POA: Diagnosis not present

## 2017-03-17 DIAGNOSIS — Z23 Encounter for immunization: Secondary | ICD-10-CM | POA: Diagnosis not present

## 2017-03-17 DIAGNOSIS — N2581 Secondary hyperparathyroidism of renal origin: Secondary | ICD-10-CM | POA: Diagnosis not present

## 2017-03-17 DIAGNOSIS — D509 Iron deficiency anemia, unspecified: Secondary | ICD-10-CM | POA: Diagnosis not present

## 2017-03-19 DIAGNOSIS — N186 End stage renal disease: Secondary | ICD-10-CM | POA: Diagnosis not present

## 2017-03-19 DIAGNOSIS — D631 Anemia in chronic kidney disease: Secondary | ICD-10-CM | POA: Diagnosis not present

## 2017-03-19 DIAGNOSIS — N2581 Secondary hyperparathyroidism of renal origin: Secondary | ICD-10-CM | POA: Diagnosis not present

## 2017-03-19 DIAGNOSIS — D509 Iron deficiency anemia, unspecified: Secondary | ICD-10-CM | POA: Diagnosis not present

## 2017-03-19 DIAGNOSIS — Z23 Encounter for immunization: Secondary | ICD-10-CM | POA: Diagnosis not present

## 2017-03-20 ENCOUNTER — Telehealth: Payer: Self-pay | Admitting: Internal Medicine

## 2017-03-20 IMAGING — CR DG CHEST 2V
2 series · 2 of 2 positions shown · non-contrast
Comparison: 03/16/2014

CLINICAL DATA: 29-year-old female with a history of fever.
Nonsmoker.

EXAM:
CHEST - 2 VIEW

[chest pa]
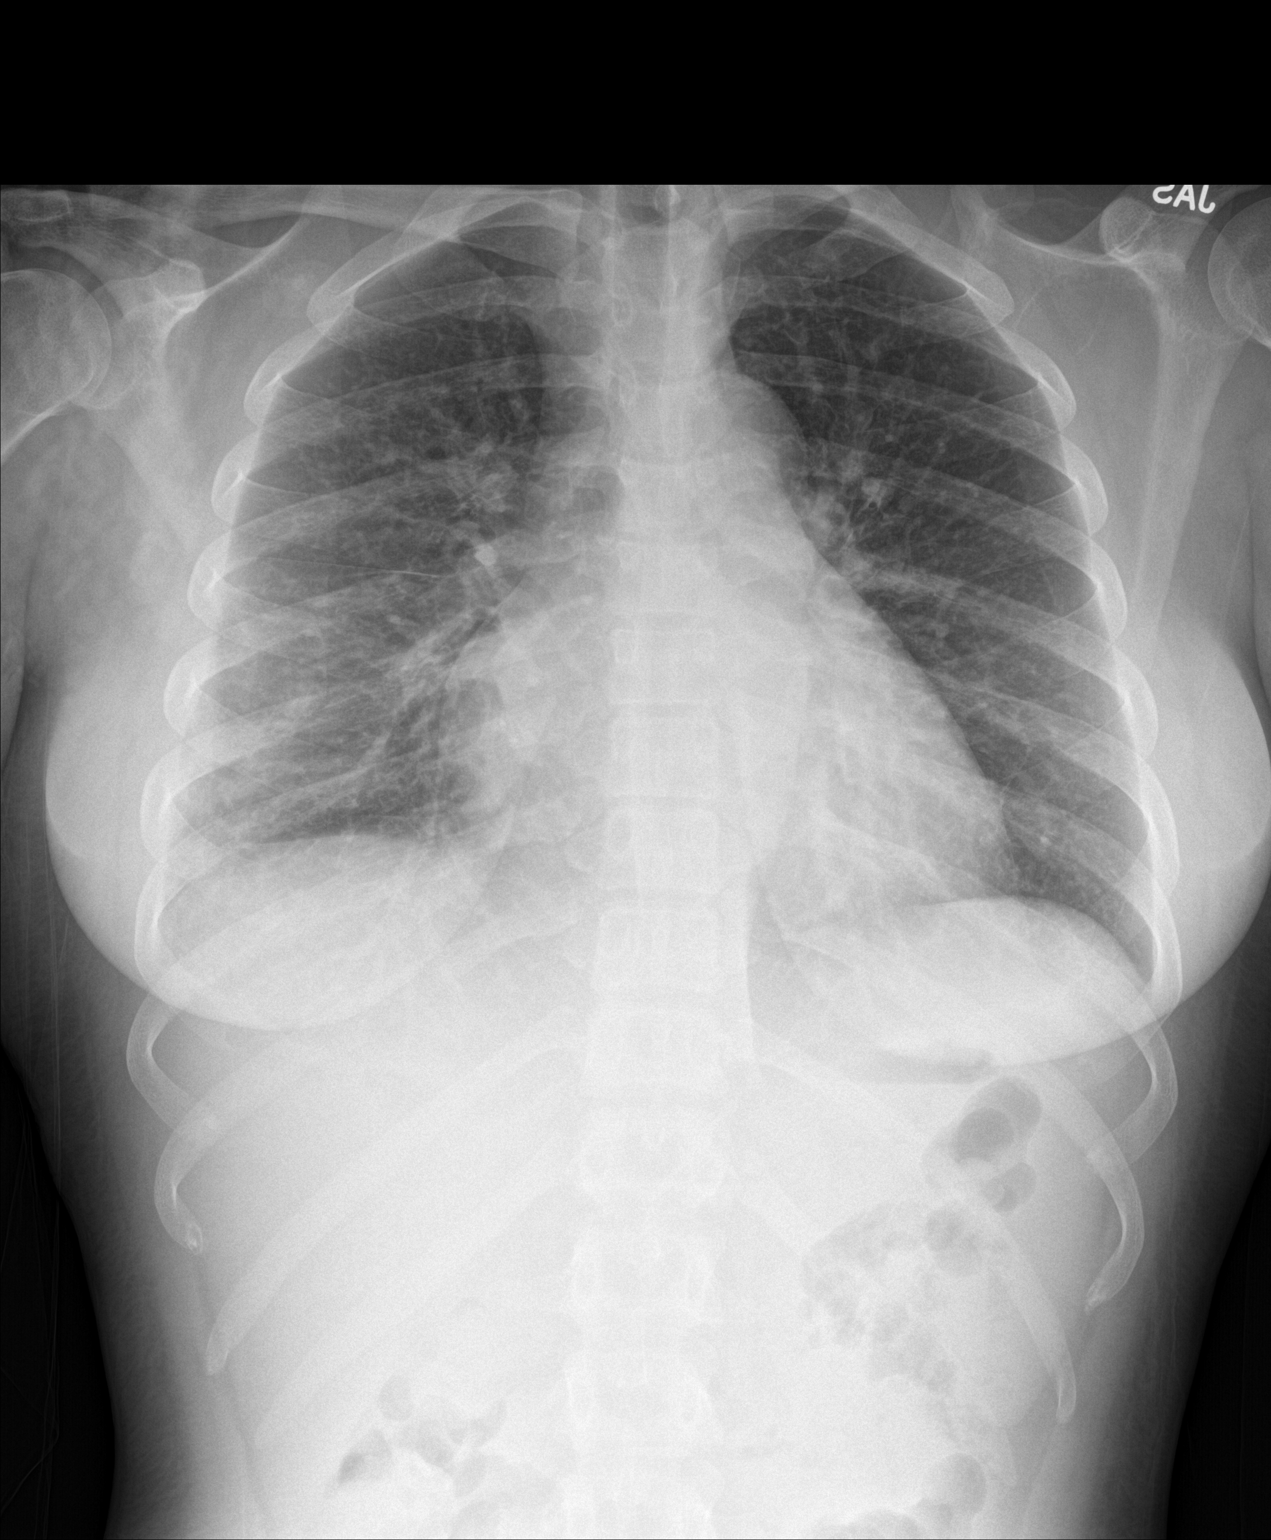

[chest lat]
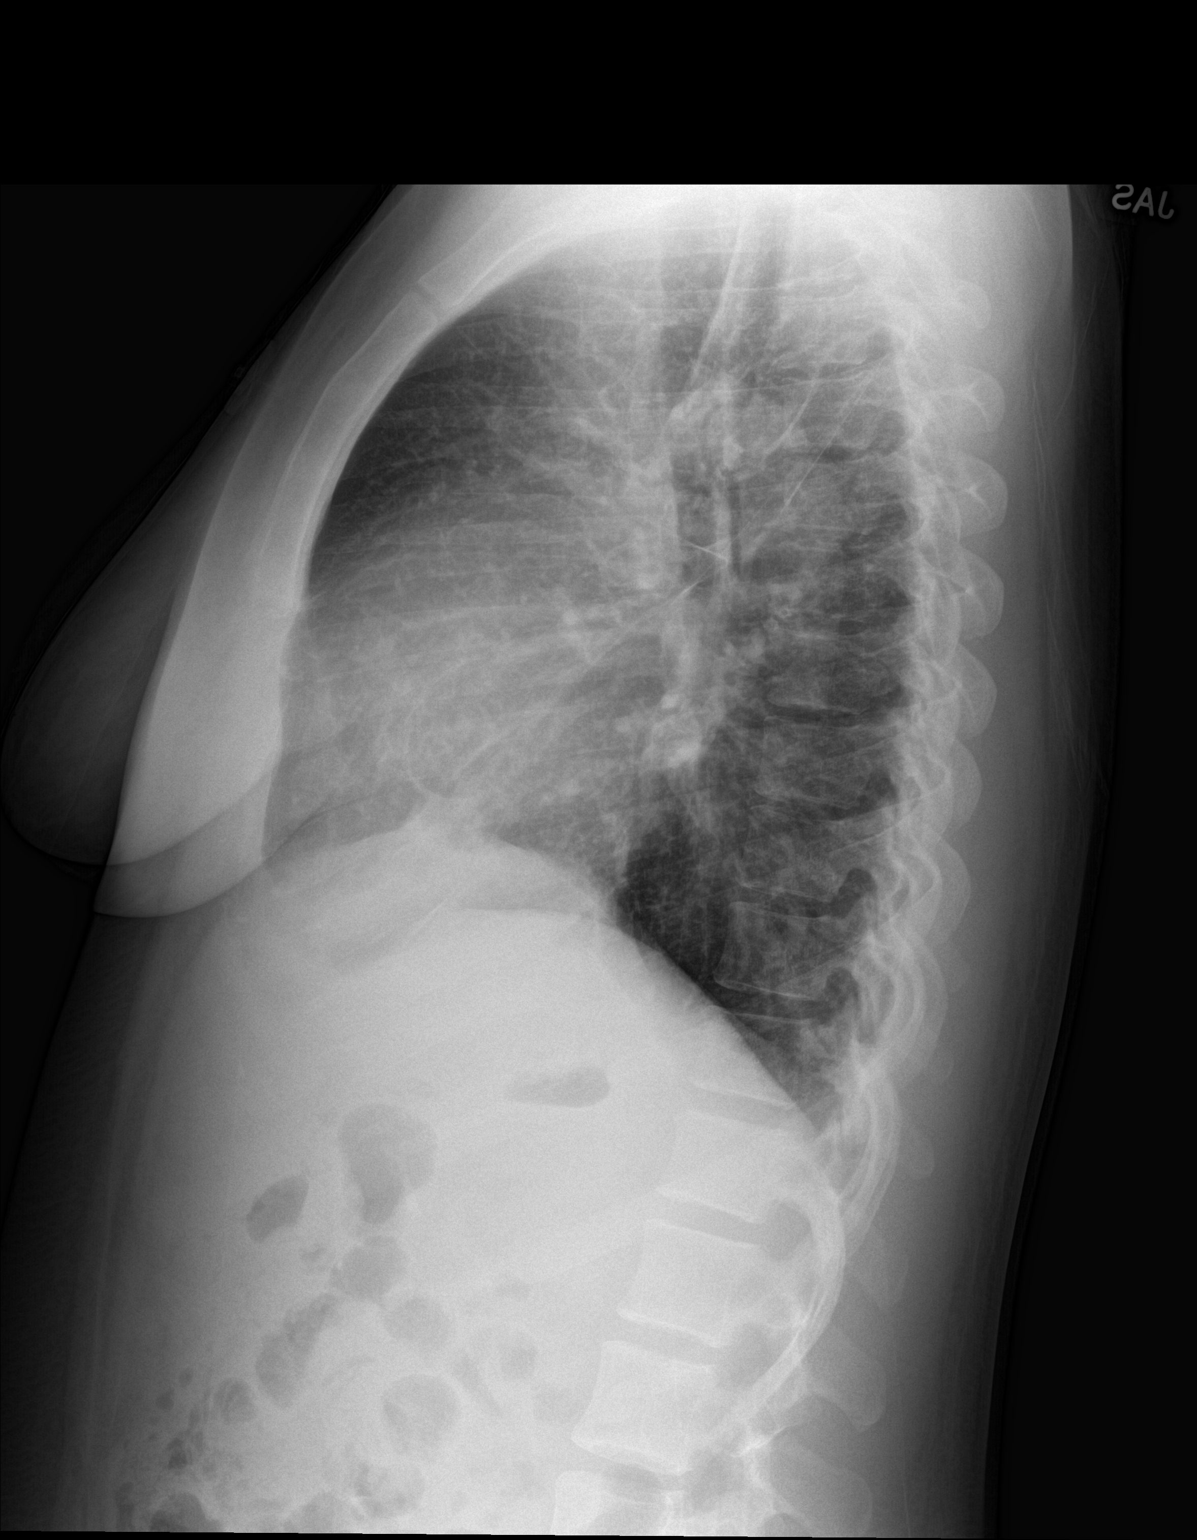

[2 of 2 positions shown; findings below may reference images not displayed]

FINDINGS: Cardiomediastinal silhouette projects within normal limits in size
and contour. Ill-defined interstitial/ airspace disease at the right
base. Ill-defined opacity in the retrocardiac region. No
pneumothorax or pleural effusion.

.

No displaced fracture.

Unremarkable appearance of the upper abdomen.
IMPRESSION: Interstitial/airspace disease of the bilateral bases, new from the
comparison concerning for multifocal infection/bronchitis given the
history.

## 2017-03-20 NOTE — Telephone Encounter (Signed)
Patient needs evaluation and clearance prior to her next appt with nephology in late October.  She will come in and see Dr. Carlean Purl on 03/05/17 2:15 for evaluation.

## 2017-03-21 DIAGNOSIS — D631 Anemia in chronic kidney disease: Secondary | ICD-10-CM | POA: Diagnosis not present

## 2017-03-21 DIAGNOSIS — N2581 Secondary hyperparathyroidism of renal origin: Secondary | ICD-10-CM | POA: Diagnosis not present

## 2017-03-21 DIAGNOSIS — Z23 Encounter for immunization: Secondary | ICD-10-CM | POA: Diagnosis not present

## 2017-03-21 DIAGNOSIS — D509 Iron deficiency anemia, unspecified: Secondary | ICD-10-CM | POA: Diagnosis not present

## 2017-03-21 DIAGNOSIS — N186 End stage renal disease: Secondary | ICD-10-CM | POA: Diagnosis not present

## 2017-03-23 DIAGNOSIS — T8610 Unspecified complication of kidney transplant: Secondary | ICD-10-CM | POA: Diagnosis not present

## 2017-03-23 DIAGNOSIS — Z992 Dependence on renal dialysis: Secondary | ICD-10-CM | POA: Diagnosis not present

## 2017-03-23 DIAGNOSIS — N186 End stage renal disease: Secondary | ICD-10-CM | POA: Diagnosis not present

## 2017-03-24 DIAGNOSIS — N186 End stage renal disease: Secondary | ICD-10-CM | POA: Diagnosis not present

## 2017-03-24 DIAGNOSIS — D631 Anemia in chronic kidney disease: Secondary | ICD-10-CM | POA: Diagnosis not present

## 2017-03-24 DIAGNOSIS — D509 Iron deficiency anemia, unspecified: Secondary | ICD-10-CM | POA: Diagnosis not present

## 2017-03-24 DIAGNOSIS — N2581 Secondary hyperparathyroidism of renal origin: Secondary | ICD-10-CM | POA: Diagnosis not present

## 2017-03-26 DIAGNOSIS — N186 End stage renal disease: Secondary | ICD-10-CM | POA: Diagnosis not present

## 2017-03-26 DIAGNOSIS — D509 Iron deficiency anemia, unspecified: Secondary | ICD-10-CM | POA: Diagnosis not present

## 2017-03-26 DIAGNOSIS — D631 Anemia in chronic kidney disease: Secondary | ICD-10-CM | POA: Diagnosis not present

## 2017-03-26 DIAGNOSIS — N2581 Secondary hyperparathyroidism of renal origin: Secondary | ICD-10-CM | POA: Diagnosis not present

## 2017-03-28 DIAGNOSIS — D631 Anemia in chronic kidney disease: Secondary | ICD-10-CM | POA: Diagnosis not present

## 2017-03-28 DIAGNOSIS — D509 Iron deficiency anemia, unspecified: Secondary | ICD-10-CM | POA: Diagnosis not present

## 2017-03-28 DIAGNOSIS — N186 End stage renal disease: Secondary | ICD-10-CM | POA: Diagnosis not present

## 2017-03-28 DIAGNOSIS — N2581 Secondary hyperparathyroidism of renal origin: Secondary | ICD-10-CM | POA: Diagnosis not present

## 2017-03-31 DIAGNOSIS — D509 Iron deficiency anemia, unspecified: Secondary | ICD-10-CM | POA: Diagnosis not present

## 2017-03-31 DIAGNOSIS — N186 End stage renal disease: Secondary | ICD-10-CM | POA: Diagnosis not present

## 2017-03-31 DIAGNOSIS — N2581 Secondary hyperparathyroidism of renal origin: Secondary | ICD-10-CM | POA: Diagnosis not present

## 2017-03-31 DIAGNOSIS — D631 Anemia in chronic kidney disease: Secondary | ICD-10-CM | POA: Diagnosis not present

## 2017-04-02 DIAGNOSIS — N186 End stage renal disease: Secondary | ICD-10-CM | POA: Diagnosis not present

## 2017-04-02 DIAGNOSIS — D631 Anemia in chronic kidney disease: Secondary | ICD-10-CM | POA: Diagnosis not present

## 2017-04-02 DIAGNOSIS — N2581 Secondary hyperparathyroidism of renal origin: Secondary | ICD-10-CM | POA: Diagnosis not present

## 2017-04-02 DIAGNOSIS — D509 Iron deficiency anemia, unspecified: Secondary | ICD-10-CM | POA: Diagnosis not present

## 2017-04-04 ENCOUNTER — Encounter (INDEPENDENT_AMBULATORY_CARE_PROVIDER_SITE_OTHER): Payer: Self-pay

## 2017-04-04 ENCOUNTER — Ambulatory Visit (INDEPENDENT_AMBULATORY_CARE_PROVIDER_SITE_OTHER): Payer: Medicare Other | Admitting: Internal Medicine

## 2017-04-04 ENCOUNTER — Encounter: Payer: Self-pay | Admitting: Internal Medicine

## 2017-04-04 VITALS — BP 132/90 | HR 80 | Ht 60.0 in | Wt 132.6 lb

## 2017-04-04 DIAGNOSIS — N2581 Secondary hyperparathyroidism of renal origin: Secondary | ICD-10-CM | POA: Diagnosis not present

## 2017-04-04 DIAGNOSIS — R933 Abnormal findings on diagnostic imaging of other parts of digestive tract: Secondary | ICD-10-CM | POA: Diagnosis not present

## 2017-04-04 DIAGNOSIS — Z8711 Personal history of peptic ulcer disease: Secondary | ICD-10-CM | POA: Diagnosis not present

## 2017-04-04 DIAGNOSIS — N186 End stage renal disease: Secondary | ICD-10-CM | POA: Diagnosis not present

## 2017-04-04 DIAGNOSIS — D509 Iron deficiency anemia, unspecified: Secondary | ICD-10-CM | POA: Diagnosis not present

## 2017-04-04 DIAGNOSIS — D631 Anemia in chronic kidney disease: Secondary | ICD-10-CM | POA: Diagnosis not present

## 2017-04-04 NOTE — Progress Notes (Signed)
Kerry Cook 31 y.o. December 02, 1985 [id number]  Assessment & Plan:   Encounter Diagnoses  Name Primary?  Marland Kitchen Hx of peptic ulcer Yes  . Abnormal computed tomography of small intestine    This is resolved. She has no active GI problems and I don't think she needs any additional testing prior to any renal transplant. I do not think there is anything infectious going on. Nor do I think she has inflammatory bowel disease. She should continue PPI. Consider reducing to a daily dose from twice a day but I will leave her where she is at this time. I cannot really explain what she's had thickening of the upper small intestine but he only abnormal finding tube note worthy multiple erosions and edema and ulcers in the antrum on her October 2017 EGD. Deep enteroscopy in May did not reveal any pathology. To consider is whether or not the losartan she takes causes some angioedema which is rare but known, more common with ACE inhibitors. Could consider changing to a different antihypertensive will defer to nephrology teams.  I appreciate the opportunity to care for this patient. CC: [name], FNP Dr. Fleet Contras Dr. Kenney Houseman at [facility] Subjective:   Chief Complaint:  HPI Kerry Cook is doing well without any problems after history of multiple gastric ulcers in the fall of 2017. She had an episode And hospital admission for epigastric pain associated with some thickening of her upper small bowel but a push enteroscopy did not demonstrate any problems. This occurred in May 2018. She is being considered for another kidney transplant and the transplant team wants to make sure we don't need to do any other testing before proceeding. No Known Allergies Current Meds  Medication Sig  . acetaminophen (TYLENOL) 325 MG tablet Take 650 mg by mouth every 6 (six) hours as needed for headache (pain).  Marland Kitchen amLODipine (NORVASC) 10 MG tablet Take 1 tablet (10 mg total) by mouth every evening. (Patient  taking differently: Take 10 mg by mouth daily. )  . calcium acetate, Phos Binder, (PHOSLYRA) 667 MG/5ML SOLN Take 1,334 mg by mouth 3 (three) times daily with meals.  . cloNIDine (CATAPRES) 0.1 MG tablet Take 0.1 mg by mouth 2 (two) times daily.  Marland Kitchen labetalol (NORMODYNE) 200 MG tablet Take 1 tablet (200 mg total) by mouth 2 (two) times daily.  Marland Kitchen losartan (COZAAR) 100 MG tablet Take 1 tablet (100 mg total) by mouth every evening.  . multivitamin (RENA-VIT) TABS tablet Take 1 tablet by mouth at bedtime.  . ondansetron (ZOFRAN ODT) 8 MG disintegrating tablet Take 1 tablet (8 mg total) by mouth every 8 (eight) hours as needed for nausea or vomiting.  . pantoprazole (PROTONIX) 40 MG tablet Take 1 tablet (40 mg total) by mouth 2 (two) times daily.  . Simethicone (GAS-X PO) Take 1 tablet by mouth daily as needed (flatulence/bloatin).   Past Medical History:  Diagnosis Date  . Chronic kidney disease   . Dialysis patient ([place])   . History of kidney transplant 2012   kidney failure due to hypertension  . Hypertension   . Multiple gastric ulcers   . Pneumonia 01/23/2015   Past Surgical History:  Procedure Laterality Date  . ESOPHAGOGASTRODUODENOSCOPY N/A 04/01/2016   Procedure: ESOPHAGOGASTRODUODENOSCOPY (EGD);  Surgeon: Gatha Mayer, MD;  Location[facility] ENDOSCOPY;  Service: Endoscopy;  Laterality: N/A;  . ESOPHAGOGASTRODUODENOSCOPY (EGD) WITH PROPOFOL N/A 11/20/2016   Procedure: ESOPHAGOGASTRODUODENOSCOPY (EGD) WITH PROPOFOL;  Surgeon: Doran Stabler, MD;  Location:  [facility] ENDOSCOPY;  Service: Endoscopy;  Laterality: N/A;  . KIDNEY TRANSPLANT Bilateral 2012   Social History   Social History  . Marital status: Single    Spouse name: N/A  . Number of children: 0  . Years of education: N/A   Occupational History  . Daycare [employer]   Social History Main Topics  . Smoking status: Never Smoker  . Smokeless tobacco: Never Used  . Alcohol use No  . Drug use: No  .  Sexual activity: Yes    Partners: Male    Birth control/ protection: Condom, None    Social History Narrative   Engaged no children works in daycare   family history includes Cancer in her maternal grandfather; Kidney disease in her father; Lupus in her maternal grandmother.   Review of Systems As per history of present illness  Objective:   Physical Exam BP 132/90   Pulse 80   Ht 5' (1.524 m)   Wt 132 lb 9.6 oz (60.1 kg)   LMP 03/23/2017 (Approximate)   SpO2 100%   BMI 25.90 kg/m  No acute distress  15 minutes time spent with patient > half in counseling coordination of care

## 2017-04-04 NOTE — Patient Instructions (Signed)
  Follow up with Dr Carlean Purl as needed.   I appreciate the opportunity to care for you. Silvano Rusk, MD, [facility]

## 2017-04-07 DIAGNOSIS — N2581 Secondary hyperparathyroidism of renal origin: Secondary | ICD-10-CM | POA: Diagnosis not present

## 2017-04-07 DIAGNOSIS — N186 End stage renal disease: Secondary | ICD-10-CM | POA: Diagnosis not present

## 2017-04-07 DIAGNOSIS — D631 Anemia in chronic kidney disease: Secondary | ICD-10-CM | POA: Diagnosis not present

## 2017-04-07 DIAGNOSIS — D509 Iron deficiency anemia, unspecified: Secondary | ICD-10-CM | POA: Diagnosis not present

## 2017-04-09 DIAGNOSIS — N2581 Secondary hyperparathyroidism of renal origin: Secondary | ICD-10-CM | POA: Diagnosis not present

## 2017-04-09 DIAGNOSIS — D509 Iron deficiency anemia, unspecified: Secondary | ICD-10-CM | POA: Diagnosis not present

## 2017-04-09 DIAGNOSIS — N186 End stage renal disease: Secondary | ICD-10-CM | POA: Diagnosis not present

## 2017-04-09 DIAGNOSIS — D631 Anemia in chronic kidney disease: Secondary | ICD-10-CM | POA: Diagnosis not present

## 2017-04-11 DIAGNOSIS — D509 Iron deficiency anemia, unspecified: Secondary | ICD-10-CM | POA: Diagnosis not present

## 2017-04-11 DIAGNOSIS — N2581 Secondary hyperparathyroidism of renal origin: Secondary | ICD-10-CM | POA: Diagnosis not present

## 2017-04-11 DIAGNOSIS — N186 End stage renal disease: Secondary | ICD-10-CM | POA: Diagnosis not present

## 2017-04-11 DIAGNOSIS — D631 Anemia in chronic kidney disease: Secondary | ICD-10-CM | POA: Diagnosis not present

## 2017-04-14 DIAGNOSIS — N2581 Secondary hyperparathyroidism of renal origin: Secondary | ICD-10-CM | POA: Diagnosis not present

## 2017-04-14 DIAGNOSIS — N186 End stage renal disease: Secondary | ICD-10-CM | POA: Diagnosis not present

## 2017-04-14 DIAGNOSIS — D509 Iron deficiency anemia, unspecified: Secondary | ICD-10-CM | POA: Diagnosis not present

## 2017-04-14 DIAGNOSIS — D631 Anemia in chronic kidney disease: Secondary | ICD-10-CM | POA: Diagnosis not present

## 2017-04-16 DIAGNOSIS — D509 Iron deficiency anemia, unspecified: Secondary | ICD-10-CM | POA: Diagnosis not present

## 2017-04-16 DIAGNOSIS — N2581 Secondary hyperparathyroidism of renal origin: Secondary | ICD-10-CM | POA: Diagnosis not present

## 2017-04-16 DIAGNOSIS — N186 End stage renal disease: Secondary | ICD-10-CM | POA: Diagnosis not present

## 2017-04-16 DIAGNOSIS — D631 Anemia in chronic kidney disease: Secondary | ICD-10-CM | POA: Diagnosis not present

## 2017-04-18 DIAGNOSIS — D631 Anemia in chronic kidney disease: Secondary | ICD-10-CM | POA: Diagnosis not present

## 2017-04-18 DIAGNOSIS — N2581 Secondary hyperparathyroidism of renal origin: Secondary | ICD-10-CM | POA: Diagnosis not present

## 2017-04-18 DIAGNOSIS — N186 End stage renal disease: Secondary | ICD-10-CM | POA: Diagnosis not present

## 2017-04-18 DIAGNOSIS — D509 Iron deficiency anemia, unspecified: Secondary | ICD-10-CM | POA: Diagnosis not present

## 2017-04-21 ENCOUNTER — Encounter (HOSPITAL_COMMUNITY): Payer: Self-pay

## 2017-04-21 ENCOUNTER — Other Ambulatory Visit: Payer: Self-pay | Admitting: Nephrology

## 2017-04-21 ENCOUNTER — Ambulatory Visit
Admission: RE | Admit: 2017-04-21 | Discharge: 2017-04-21 | Disposition: A | Discharge status: Home / Self Care | Payer: Medicare Other | Source: Ambulatory Visit | Attending: Nephrology | Admitting: Nephrology

## 2017-04-21 DIAGNOSIS — D509 Iron deficiency anemia, unspecified: Secondary | ICD-10-CM | POA: Diagnosis not present

## 2017-04-21 DIAGNOSIS — I120 Hypertensive chronic kidney disease with stage 5 chronic kidney disease or end stage renal disease: Secondary | ICD-10-CM | POA: Diagnosis present

## 2017-04-21 DIAGNOSIS — M25422 Effusion, left elbow: Secondary | ICD-10-CM

## 2017-04-21 DIAGNOSIS — Z809 Family history of malignant neoplasm, unspecified: Secondary | ICD-10-CM

## 2017-04-21 DIAGNOSIS — Z841 Family history of disorders of kidney and ureter: Secondary | ICD-10-CM

## 2017-04-21 DIAGNOSIS — L03114 Cellulitis of left upper limb: Secondary | ICD-10-CM | POA: Diagnosis not present

## 2017-04-21 DIAGNOSIS — M25522 Pain in left elbow: Secondary | ICD-10-CM

## 2017-04-21 DIAGNOSIS — N186 End stage renal disease: Secondary | ICD-10-CM | POA: Diagnosis not present

## 2017-04-21 DIAGNOSIS — Z940 Kidney transplant status: Secondary | ICD-10-CM

## 2017-04-21 DIAGNOSIS — Z992 Dependence on renal dialysis: Secondary | ICD-10-CM | POA: Diagnosis not present

## 2017-04-21 DIAGNOSIS — D631 Anemia in chronic kidney disease: Secondary | ICD-10-CM | POA: Diagnosis not present

## 2017-04-21 DIAGNOSIS — Y830 Surgical operation with transplant of whole organ as the cause of abnormal reaction of the patient, or of later complication, without mention of misadventure at the time of the procedure: Secondary | ICD-10-CM | POA: Diagnosis not present

## 2017-04-21 DIAGNOSIS — N2581 Secondary hyperparathyroidism of renal origin: Secondary | ICD-10-CM | POA: Diagnosis not present

## 2017-04-21 NOTE — ED Triage Notes (Signed)
Pt complains of left elbow pain and swelling, she has a dialysis fistula in that arm and had dialysis today Pt noticed the pain and swelling yesterday After dialysis today they sent her to [place] for an xray of that elbow

## 2017-04-22 ENCOUNTER — Inpatient Hospital Stay (HOSPITAL_COMMUNITY)
Admission: EM | Admit: 2017-04-22 | Discharge: 2017-04-23 | DRG: 602 | Disposition: A | Discharge status: Home / Self Care | Payer: Medicare Other | Attending: Internal Medicine | Admitting: Internal Medicine

## 2017-04-22 ENCOUNTER — Encounter (HOSPITAL_COMMUNITY): Payer: Self-pay | Admitting: Internal Medicine

## 2017-04-22 DIAGNOSIS — L0390 Cellulitis, unspecified: Secondary | ICD-10-CM | POA: Insufficient documentation

## 2017-04-22 DIAGNOSIS — Y830 Surgical operation with transplant of whole organ as the cause of abnormal reaction of the patient, or of later complication, without mention of misadventure at the time of the procedure: Secondary | ICD-10-CM | POA: Diagnosis not present

## 2017-04-22 DIAGNOSIS — I10 Essential (primary) hypertension: Secondary | ICD-10-CM | POA: Diagnosis not present

## 2017-04-22 DIAGNOSIS — T8610 Unspecified complication of kidney transplant: Secondary | ICD-10-CM | POA: Diagnosis not present

## 2017-04-22 DIAGNOSIS — Z841 Family history of disorders of kidney and ureter: Secondary | ICD-10-CM | POA: Diagnosis not present

## 2017-04-22 DIAGNOSIS — L03114 Cellulitis of left upper limb: Secondary | ICD-10-CM | POA: Diagnosis present

## 2017-04-22 DIAGNOSIS — Z992 Dependence on renal dialysis: Secondary | ICD-10-CM

## 2017-04-22 DIAGNOSIS — D631 Anemia in chronic kidney disease: Secondary | ICD-10-CM | POA: Diagnosis not present

## 2017-04-22 DIAGNOSIS — Z809 Family history of malignant neoplasm, unspecified: Secondary | ICD-10-CM | POA: Diagnosis not present

## 2017-04-22 DIAGNOSIS — N186 End stage renal disease: Secondary | ICD-10-CM

## 2017-04-22 DIAGNOSIS — Z940 Kidney transplant status: Secondary | ICD-10-CM | POA: Diagnosis not present

## 2017-04-22 DIAGNOSIS — I120 Hypertensive chronic kidney disease with stage 5 chronic kidney disease or end stage renal disease: Secondary | ICD-10-CM | POA: Diagnosis present

## 2017-04-22 LAB — I-STAT CG4 LACTIC ACID, ED: LACTIC ACID, VENOUS: 1 mmol/L (ref 0.5–1.9)

## 2017-04-22 LAB — BASIC METABOLIC PANEL
Anion gap: 15 (ref 5–15)
BUN: 28 mg/dL — AB (ref 6–20)
CHLORIDE: 94 mmol/L — AB (ref 101–111)
CO2: 28 mmol/L (ref 22–32)
CREATININE: 8.29 mg/dL — AB (ref 0.44–1.00)
Calcium: 9.5 mg/dL (ref 8.9–10.3)
GFR calc Af Amer: 7 mL/min — ABNORMAL LOW (ref >60)
GFR calc non Af Amer: 6 mL/min — ABNORMAL LOW (ref >60)
Glucose, Bld: 87 mg/dL (ref 65–99)
POTASSIUM: 4.9 mmol/L (ref 3.5–5.1)
SODIUM: 137 mmol/L (ref 135–145)

## 2017-04-22 LAB — CBC WITH DIFFERENTIAL/PLATELET
Basophils Absolute: 0 10*3/uL (ref 0.0–0.1)
Basophils Relative: 0 %
EOS ABS: 0.2 10*3/uL (ref 0.0–0.7)
Eosinophils Relative: 2 %
HCT: 34.4 % — ABNORMAL LOW (ref 36.0–46.0)
HEMOGLOBIN: 11.3 g/dL — AB (ref 12.0–15.0)
LYMPHS ABS: 1.7 10*3/uL (ref 0.7–4.0)
LYMPHS PCT: 21 %
MCH: 31.1 pg (ref 26.0–34.0)
MCHC: 32.8 g/dL (ref 30.0–36.0)
MCV: 94.8 fL (ref 78.0–100.0)
MONOS PCT: 8 %
Monocytes Absolute: 0.6 10*3/uL (ref 0.1–1.0)
NEUTROS ABS: 5.5 10*3/uL (ref 1.7–7.7)
NEUTROS PCT: 69 %
Platelets: 122 10*3/uL — ABNORMAL LOW (ref 150–400)
RBC: 3.63 MIL/uL — AB (ref 3.87–5.11)
RDW: 17.4 % — ABNORMAL HIGH (ref 11.5–15.5)
WBC: 8 10*3/uL (ref 4.0–10.5)

## 2017-04-22 LAB — MRSA PCR SCREENING: MRSA BY PCR: NEGATIVE

## 2017-04-22 MED ORDER — ONDANSETRON 4 MG PO TBDP: 8.0000 mg | ORAL_TABLET | Freq: Three times a day (TID) | ORAL | Status: DC | PRN

## 2017-04-22 MED ORDER — ACETAMINOPHEN 325 MG PO TABS
650.0000 mg | ORAL_TABLET | Freq: Four times a day (QID) | ORAL | Status: DC | PRN
  Administered 2017-04-23: 650 mg via ORAL
  Filled 2017-04-22: qty 2

## 2017-04-22 MED ORDER — LEVOFLOXACIN IN D5W 500 MG/100ML IV SOLN: 500.0000 mg | Freq: Once | INTRAVENOUS | Status: DC

## 2017-04-22 MED ORDER — VANCOMYCIN HCL IN DEXTROSE 1-5 GM/200ML-% IV SOLN
1000.0000 mg | Freq: Once | INTRAVENOUS | Status: AC
  Administered 2017-04-22: 1000 mg via INTRAVENOUS
  Filled 2017-04-22: qty 200

## 2017-04-22 MED ORDER — CALCIUM ACETATE (PHOS BINDER) 667 MG PO CAPS
1334.0000 mg | ORAL_CAPSULE | Freq: Three times a day (TID) | ORAL | Status: DC
  Administered 2017-04-22 – 2017-04-23 (×4): 1334 mg via ORAL
  Filled 2017-04-22 (×6): qty 2

## 2017-04-22 MED ORDER — MORPHINE SULFATE (PF) 4 MG/ML IV SOLN
4.0000 mg | Freq: Once | INTRAVENOUS | Status: AC
  Administered 2017-04-22: 4 mg via INTRAVENOUS
  Filled 2017-04-22: qty 1

## 2017-04-22 MED ORDER — VANCOMYCIN HCL 500 MG IV SOLR
500.0000 mg | INTRAVENOUS | Status: DC
  Filled 2017-04-22: qty 500

## 2017-04-22 MED ORDER — ACETAMINOPHEN 650 MG RE SUPP: 650.0000 mg | Freq: Four times a day (QID) | RECTAL | Status: DC | PRN

## 2017-04-22 MED ORDER — HYDRALAZINE HCL 20 MG/ML IJ SOLN
5.0000 mg | Freq: Four times a day (QID) | INTRAMUSCULAR | Status: DC | PRN
  Administered 2017-04-23: 0.25 mg via INTRAVENOUS
  Filled 2017-04-22: qty 1

## 2017-04-22 MED ORDER — ONDANSETRON HCL 4 MG/2ML IJ SOLN
4.0000 mg | Freq: Once | INTRAMUSCULAR | Status: AC
  Administered 2017-04-22: 4 mg via INTRAVENOUS
  Filled 2017-04-22: qty 2

## 2017-04-22 MED ORDER — LEVOFLOXACIN IN D5W 250 MG/50ML IV SOLN: 250.0000 mg | INTRAVENOUS | Status: DC

## 2017-04-22 MED ORDER — CLONIDINE HCL 0.1 MG PO TABS
0.1000 mg | ORAL_TABLET | Freq: Two times a day (BID) | ORAL | Status: DC
  Administered 2017-04-22 (×2): 0.1 mg via ORAL
  Filled 2017-04-22 (×3): qty 1

## 2017-04-22 MED ORDER — PIPERACILLIN-TAZOBACTAM 3.375 G IVPB 30 MIN
3.3750 g | Freq: Once | INTRAVENOUS | Status: AC
  Administered 2017-04-22: 3.375 g via INTRAVENOUS
  Filled 2017-04-22: qty 50

## 2017-04-22 MED ORDER — LABETALOL HCL 200 MG PO TABS
200.0000 mg | ORAL_TABLET | Freq: Two times a day (BID) | ORAL | Status: DC
  Administered 2017-04-22 (×2): 200 mg via ORAL
  Filled 2017-04-22 (×4): qty 1

## 2017-04-22 MED ORDER — DIPHENHYDRAMINE HCL 25 MG PO CAPS
50.0000 mg | ORAL_CAPSULE | Freq: Once | ORAL | Status: AC
  Administered 2017-04-22: 50 mg via ORAL
  Filled 2017-04-22: qty 2

## 2017-04-22 MED ORDER — HEPARIN SODIUM (PORCINE) 5000 UNIT/ML IJ SOLN: 5000.0000 [IU] | Freq: Three times a day (TID) | INTRAMUSCULAR | Status: DC

## 2017-04-22 MED ORDER — LOSARTAN POTASSIUM 50 MG PO TABS
100.0000 mg | ORAL_TABLET | Freq: Every evening | ORAL | Status: DC
  Administered 2017-04-22: 100 mg via ORAL
  Filled 2017-04-22 (×3): qty 2

## 2017-04-22 MED ORDER — RENA-VITE PO TABS
1.0000 | ORAL_TABLET | Freq: Every day | ORAL | Status: DC
  Administered 2017-04-22: 1 via ORAL
  Filled 2017-04-22 (×2): qty 1

## 2017-04-22 MED ORDER — AMLODIPINE BESYLATE 10 MG PO TABS
10.0000 mg | ORAL_TABLET | Freq: Every day | ORAL | Status: DC
  Administered 2017-04-22: 10 mg via ORAL
  Filled 2017-04-22: qty 2
  Filled 2017-04-22: qty 1

## 2017-04-22 NOTE — H&P (Signed)
TRH H&P   Patient Demographics:    Kerry Cook, is a 31 y.o. female  MRN: [id number]   DOB - 1986/04/03  Admit Date - 04/22/2017  Outpatient Primary MD for the patient is [name], FNP  Referring MD/NP/PA: Erasmo Downer Ward  Outpatient Specialists:  [place] [facility]  Patient coming from: home  Chief Complaint  Patient presents with  . Elbow Pain      HPI:    Kerry Cook  is a 31 y.o. female, w Hypertension,  ESRD on HD (M,W, F), apparently c/o left elbow redness and pain over the past 3 days .  Pt denies fever, chlls.    In ED, pt has xray left elbow IMPRESSION: No acute osseous pathology.  Wbc 8.0, Hgb 11.3, Plt 122 Bun 28, Creatinine 8.29  Pt started itching after receiving zosyn in ED  Dr. Leonides Schanz states had brief curbside with orthopedics who stated no indication for aspiration of elbow joint at this time.  If worses would consider imaging and consulting orthpedics. Pt will be admitted for cellulitis.     Review of systems:    In addition to the HPI above,  No Fever-chills, No Headache, No changes with Vision or hearing, No problems swallowing food or Liquids, No Chest pain, Cough or Shortness of Breath, No Abdominal pain, No Nausea or Vommitting, Bowel movements are regular, No Blood in stool or Urine, No dysuria,  No new joints pains-aches,  No new weakness, tingling, numbness in any extremity, No recent weight gain or loss, No polyuria, polydypsia or polyphagia, No significant Mental Stressors.  A full 10 point Review of Systems was done, except as stated above, all other Review of Systems were negative.   With Past History of the following :    Past Medical History:  Diagnosis Date  . Chronic kidney disease   . Dialysis patient ([place])   . History of kidney transplant 2012   kidney failure due to hypertension  . Hypertension   .  Multiple gastric ulcers   . Pneumonia 01/23/2015      Past Surgical History:  Procedure Laterality Date  . ESOPHAGOGASTRODUODENOSCOPY N/A 04/01/2016   Procedure: ESOPHAGOGASTRODUODENOSCOPY (EGD);  Surgeon: Gatha Mayer, MD;  Location: [facility] ENDOSCOPY;  Service: Endoscopy;  Laterality: N/A;  . ESOPHAGOGASTRODUODENOSCOPY (EGD) WITH PROPOFOL N/A 11/20/2016   Procedure: ESOPHAGOGASTRODUODENOSCOPY (EGD) WITH PROPOFOL;  Surgeon: Doran Stabler, MD;  Location: [place];  Service: Endoscopy;  Laterality: N/A;  . KIDNEY TRANSPLANT Bilateral 2012      Social History:     Social History  Substance Use Topics  . Smoking status: Never Smoker  . Smokeless tobacco: Never Used  . Alcohol use No     Lives - at home  Mobility - walks by self   Family History :     Family History  Problem Relation Age of Onset  . Kidney  disease Father   . Lupus Maternal Grandmother   . Cancer Maternal Grandfather   . Colon cancer Neg Hx   . Stomach cancer Neg Hx   . Rectal cancer Neg Hx   . Esophageal cancer Neg Hx   . Liver cancer Neg Hx       Home Medications:   Prior to Admission medications   Medication Sig Start Date End Date Taking? Authorizing Provider  acetaminophen (TYLENOL) 325 MG tablet Take 650 mg by mouth every 6 (six) hours as needed for headache (pain).   Yes [provider]  amLODipine (NORVASC) 10 MG tablet Take 1 tablet (10 mg total) by mouth every evening. Patient taking differently: Take 10 mg by mouth daily.  09/13/15  Yes [name], MD  calcium acetate, Phos Binder, (PHOSLYRA) 667 MG/5ML SOLN Take 1,334 mg by mouth 3 (three) times daily with meals.   Yes [provider]  cloNIDine (CATAPRES) 0.1 MG tablet Take 0.1 mg by mouth 2 (two) times daily.   Yes [provider]  labetalol (NORMODYNE) 200 MG tablet Take 1 tablet (200 mg total) by mouth 2 (two) times daily. 04/02/16  Yes Elgergawy, Silver Huguenin, MD  losartan (COZAAR) 100 MG tablet Take 1  tablet (100 mg total) by mouth every evening. 05/21/16  Yes Verlee Monte, MD  multivitamin (RENA-VIT) TABS tablet Take 1 tablet by mouth at bedtime. 06/09/15  Yes Debbe Odea, MD  ondansetron (ZOFRAN ODT) 8 MG disintegrating tablet Take 1 tablet (8 mg total) by mouth every 8 (eight) hours as needed for nausea or vomiting. 03/24/16  Yes Jola Schmidt, MD  Simethicone (GAS-X PO) Take 1 tablet by mouth daily as needed (flatulence/bloatin).   Yes [provider]     Allergies:    No Known Allergies   Physical Exam:   Vitals  Blood pressure (!) 174/112, pulse 86, temperature 98.6 F (37 C), temperature source Oral, resp. rate 19, height 5' (1.524 m), weight 56.7 kg (125 lb), last menstrual period 03/23/2017, SpO2 100 %, unknown if currently breastfeeding.   1. General  lying in bed in NAD,   2. Normal affect and insight, Not Suicidal or Homicidal, Awake Alert, Oriented X 3.  3. No F.N deficits, ALL C.Nerves Intact, Strength 5/5 all 4 extremities, Sensation intact all 4 extremities, Plantars down going.  4. Ears and Eyes appear Normal, Conjunctivae clear, PERRLA. Moist Oral Mucosa.  5. Supple Neck, No JVD, No cervical lymphadenopathy appriciated, No Carotid Bruits.  6. Symmetrical Chest wall movement, Good air movement bilaterally, CTAB.  7. RRR, No Gallops, Rubs or Murmurs, No Parasternal Heave.  8. Positive Bowel Sounds, Abdomen Soft, No tenderness, No organomegaly appriciated,No rebound -guarding or rigidity.  9.  No Cyanosis, slight warm and redness extending from left elbow about 1/3 up left upper arm  10. Good muscle tone,  joints appear normal , no effusions, Normal ROM.  11. No Palpable Lymph Nodes in Neck or Axillae  L forearm AVF   Data Review:    CBC  Recent Labs Lab 04/22/17 0220  WBC 8.0  HGB 11.3*  HCT 34.4*  PLT 122*  MCV 94.8  MCH 31.1  MCHC 32.8  RDW 17.4*  LYMPHSABS 1.7  MONOABS 0.6  EOSABS 0.2  BASOSABS 0.0    ------------------------------------------------------------------------------------------------------------------  Chemistries   Recent Labs Lab 04/22/17 0220  NA 137  K 4.9  CL 94*  CO2 28  GLUCOSE 87  BUN 28*  CREATININE 8.29*  CALCIUM 9.5   ------------------------------------------------------------------------------------------------------------------  estimated creatinine clearance is 7.8 mL/min (A) (by C-G formula based on SCr of 8.29 mg/dL (H)). ------------------------------------------------------------------------------------------------------------------ No results for input(s): TSH, T4TOTAL, T3FREE, THYROIDAB in the last 72 hours.  Invalid input(s): FREET3  Coagulation profile No results for input(s): INR, PROTIME in the last 168 hours. ------------------------------------------------------------------------------------------------------------------- No results for input(s): DDIMER in the last 72 hours. -------------------------------------------------------------------------------------------------------------------  Cardiac Enzymes No results for input(s): CKMB, TROPONINI, MYOGLOBIN in the last 168 hours.  Invalid input(s): CK ------------------------------------------------------------------------------------------------------------------ No results found for: BNP   ---------------------------------------------------------------------------------------------------------------  Urinalysis    Component Value Date/Time   COLORURINE YELLOW 01/21/2015 1343   APPEARANCEUR HAZY (A) 01/21/2015 1343   LABSPEC 1.009 01/21/2015 1343   PHURINE 8.5 (H) 01/21/2015 1343   GLUCOSEU 100 (A) 01/21/2015 1343   HGBUR SMALL (A) 01/21/2015 1343   BILIRUBINUR NEGATIVE 01/21/2015 1343   KETONESUR 15 (A) 01/21/2015 1343   PROTEINUR >300 (A) 01/21/2015 1343   UROBILINOGEN 0.2 01/21/2015 1343   NITRITE NEGATIVE 01/21/2015 1343   LEUKOCYTESUR NEGATIVE 01/21/2015 1343     ----------------------------------------------------------------------------------------------------------------   Imaging Results:    Dg Elbow Complete Left (3+view)  Result Date: 04/22/2017 CLINICAL DATA:  31 year old female with left elbow pain.  No trauma. EXAM: LEFT ELBOW - COMPLETE 3+ VIEW COMPARISON:  None FINDINGS: There is no acute fracture or dislocation. The bones are well mineralized. No arthritic changes. Small bony spur noted along the dorsal aspect of the olecranon. There is no joint effusion. Mildly dilated superficial subcutaneous veins noted. The soft tissues appear unremarkable. IMPRESSION: No acute osseous pathology. Electronically Signed   By: Anner Crete M.D.   On: 04/22/2017 02:00      Assessment & Plan:    Active Problems:   End stage renal disease on dialysis [facility])   Hypertension   Cellulitis    Cellulitis Blood culture x2 Esr vanco iv, levaquin iv pharmacy to dose  Hypertension Cont losartan, labetalol, amlodipine  ESRD on HD, M, W, F Please consult nephrology for dialysis Cont phoslyra, renavite   DVT Prophylaxis Heparin -   SCDs  AM Labs Ordered, also please review Full Orders  Family Communication: Admission, patients condition and plan of care including tests being ordered have been discussed with the patient  who indicate understanding and agree with the plan and Code Status.  Code Status FULL CODE  Likely DC to  home  Condition GUARDED    Consults called: none  Admission status: observation  Time spent in minutes : 45   Jani Gravel M.D on 04/22/2017 at 3:56 AM  Between 7am to 7pm - Pager - [phone]. After 7pm go to [email or web address] - password [email or web address]  [facility] - Office  [phone]

## 2017-04-22 NOTE — ED Notes (Signed)
Unable to collect 2nd set of blood culture, pt. Has AV fistula on left arm currently being use during dialysis, right arm has old fistula pt. Pt. Kerry Cook can not stick  Right arm above the wrist. Obtained  1 blue bottle only for 1st set of BC.

## 2017-04-22 NOTE — Progress Notes (Addendum)
Pharmacy Antibiotic Note  AIRIKA ALKHATIB is a 31 y.o. female admitted on 04/22/2017 with cellulitis.  Pharmacy has been consulted for Vanc/Levaquin dosing.  Plan: NOTE patient on dialysis 1) Vanc 1g x 1 then 500mg  IV q24 based on pre and post HD vanc levels per protocol 2) Levaquin 500mg  x 1 then 250mg  IV q48   Height: 5' (152.4 cm) Weight: 125 lb (56.7 kg) IBW/kg (Calculated) : 45.5  Temp (24hrs), Avg:98.6 F (37 C), Min:98.6 F (37 C), Max:98.6 F (37 C)   Recent Labs Lab 04/22/17 0220 04/22/17 0233  WBC 8.0  --   CREATININE 8.29*  --   LATICACIDVEN  --  1.00    Estimated Creatinine Clearance: 7.8 mL/min (A) (by C-G formula based on SCr of 8.29 mg/dL (H)).    Allergies  Allergen Reactions  . Zosyn [Piperacillin Sod-Tazobactam So] Itching     Thank you for allowing pharmacy to be a part of this patient's care.   Adrian Saran, PharmD, BCPS Pager [phone] 04/22/2017 8:21 AM

## 2017-04-22 NOTE — ED Provider Notes (Addendum)
TIME SEEN: 1:45 AM  CHIEF COMPLAINT: Left elbow pain  HPI: Patient is a 31 year old right-hand-dominant female with history of hypertension, end-stage renal disease on hemodialysis MWF last dialyzed yesterday who presents to the emergency department with left elbow pain that started 2 days ago.  No injury to this arm.  Has noted that it has been red, warm and swollen.  Her nephrologist, Dr. Moshe Cipro, ordered an x-ray as an outpatient.  She states pain got worse so she decided to come to the ER.  No fever.  States she has not had any pain or swelling over her dialysis site.    ROS: See HPI Constitutional: no fever  Eyes: no drainage  ENT: no runny nose   Cardiovascular:  no chest pain  Resp: no SOB  GI: no vomiting GU: no dysuria Integumentary: no rash  Allergy: no hives  Musculoskeletal: no leg swelling  Neurological: no slurred speech ROS otherwise negative  PAST MEDICAL HISTORY/PAST SURGICAL HISTORY:  Past Medical History:  Diagnosis Date  . Chronic kidney disease   . Dialysis patient ([place])   . History of kidney transplant 2012   kidney failure due to hypertension  . Hypertension   . Multiple gastric ulcers   . Pneumonia 01/23/2015    MEDICATIONS:  Prior to Admission medications   Medication Sig Start Date End Date Taking? Authorizing Provider  acetaminophen (TYLENOL) 325 MG tablet Take 650 mg by mouth every 6 (six) hours as needed for headache (pain).    [provider]  amLODipine (NORVASC) 10 MG tablet Take 1 tablet (10 mg total) by mouth every evening. Patient taking differently: Take 10 mg by mouth daily.  09/13/15   [name], MD  calcium acetate, Phos Binder, (PHOSLYRA) 667 MG/5ML SOLN Take 1,334 mg by mouth 3 (three) times daily with meals.    [provider]  cloNIDine (CATAPRES) 0.1 MG tablet Take 0.1 mg by mouth 2 (two) times daily.    [provider]  labetalol (NORMODYNE) 200 MG tablet Take 1 tablet (200 mg total) by mouth 2  (two) times daily. 04/02/16   Elgergawy, Silver Huguenin, MD  losartan (COZAAR) 100 MG tablet Take 1 tablet (100 mg total) by mouth every evening. 05/21/16   Verlee Monte, MD  multivitamin (RENA-VIT) TABS tablet Take 1 tablet by mouth at bedtime. 06/09/15   Debbe Odea, MD  ondansetron (ZOFRAN ODT) 8 MG disintegrating tablet Take 1 tablet (8 mg total) by mouth every 8 (eight) hours as needed for nausea or vomiting. 03/24/16   Jola Schmidt, MD  pantoprazole (PROTONIX) 40 MG tablet Take 1 tablet (40 mg total) by mouth 2 (two) times daily. 10/17/16   Levin Erp, PA  Simethicone (GAS-X PO) Take 1 tablet by mouth daily as needed (flatulence/bloatin).    [provider]    ALLERGIES:  No Known Allergies  SOCIAL HISTORY:  Social History  Substance Use Topics  . Smoking status: Never Smoker  . Smokeless tobacco: Never Used  . Alcohol use No    FAMILY HISTORY: Family History  Problem Relation Age of Onset  . Kidney disease Father   . Lupus Maternal Grandmother   . Cancer Maternal Grandfather   . Colon cancer Neg Hx   . Stomach cancer Neg Hx   . Rectal cancer Neg Hx   . Esophageal cancer Neg Hx   . Liver cancer Neg Hx     EXAM: BP (!) 160/106 (BP Location: Right Arm)   Pulse 81   Temp  98.6 F (37 C) (Oral)   Resp 18   Ht 5' (1.524 m)   Wt 56.7 kg (125 lb)   LMP 03/23/2017 (Approximate)   SpO2 100%   BMI 24.41 kg/m  CONSTITUTIONAL: Alert and oriented and responds appropriately to questions.  Chronically ill-appearing, appears uncomfortable, afebrile, nontoxic HEAD: Normocephalic EYES: Conjunctivae clear, pupils appear equal, EOMI ENT: normal nose; moist mucous membranes NECK: Supple, no meningismus, no nuchal rigidity, no LAD  CARD: RRR; S1 and S2 appreciated; no murmurs, no clicks, no rubs, no gallops RESP: Normal chest excursion without splinting or tachypnea; breath sounds clear and equal bilaterally; no wheezes, no rhonchi, no rales, no hypoxia or  respiratory distress, speaking full sentences ABD/GI: Normal bowel sounds; non-distended; soft, non-tender, no rebound, no guarding, no peritoneal signs, no hepatosplenomegaly BACK:  The back appears normal and is non-tender to palpation, there is no CVA tenderness EXT: Dialysis fistula to the left forearm with good thrill and bruit with no erythema, warmth, tenderness or drainage.  Patient has minimal soft tissue swelling around the left wrist joint effusion.  No bursitis seen.  There is erythema present.  2+ radial pulses bilaterally.  Patient keeps her elbow at approximately 70 degrees of flexion.  She is able to extend the elbow but has a difficult time flexing past 70 degrees without significant pain.  She does have some erythema and warmth to the posterior and distal upper arm consistent with cellulitis.  No induration.  No bony abnormality on exam or bony tenderness.  Otherwise normal ROM in all joints; otherwise extremities are non-tender to palpation; no edema; normal capillary refill; no cyanosis, no calf tenderness or swelling; compartments are soft to touch SKIN: Normal color for age and race; warm; no rash NEURO: Moves all extremities equally PSYCH: The patient's mood and manner are appropriate. Grooming and personal hygiene are appropriate.  MEDICAL DECISION MAKING: Patient here with left elbow pain.  No sign of bursitis.  She does not have a significant joint effusion but I am concerned for possible septic arthritis.  X-ray obtained as an outpatient shows no bony abnormality and I do not appreciate a drainable abscess on exam.  This could also be gout but patient denies a history of the same.  Given this is the same arm that her dialysis fistula is present in, will consult orthopedics for their recommendations.  Will obtain labs, blood cultures.  Will give pain and nausea medication.  ED PROGRESS: Discussed with Dr. Erlinda Hong with orthopedics.  Appreciate his help.  He agrees that if this is septic  arthritis patient would likely have a large joint effusion and given an area of cellulitis seen on exam he recommends treating her with antibiotics for cellulitis and if symptoms are not improving, medicine could then order advanced imaging at that time.  I am apprehensive to attempt to tap the joint that does not seem to have an effusion especially in the arm where her dialysis fistula is present.  Patient's labs are unremarkable.  No leukocytosis.  Normal lactate.  Cultures pending.  X-ray read as normal.  Patient is very hypertensive but this is her baseline.  Will discuss with hospitalist for admission.   3:46 AM Discussed patient's case with hospitalist, Dr. Maudie Mercury.  I have recommended admission and patient (and family if present) agree with this plan. Admitting physician will place admission orders.   Patient is reluctant to stay but does agree for admission at this time.  I reviewed all nursing notes, vitals, pertinent  previous records, EKGs, lab and urine results, imaging (as available).     Daysean Tinkham, Delice Bison, DO 04/22/17 0346    Patient started itching after IV Zosyn.  Received the entire amount.  No rash.  Will give Benadryl.   Alexandru Moorer, Delice Bison, DO 04/22/17 [phone]

## 2017-04-22 NOTE — Progress Notes (Signed)
[facility] PROGRESS NOTE    Progress Note  TAHJ Cook  CXK:[id number] DOB: 1986/05/19 DOA: 04/22/2017 PCP: [name], FNP     Brief Narrative:   Kerry Cook is an 31 y.o. female past medical history of essential hypertension, end-stage renal disease with dialyzes mother was in Friday comes in for left elbow erythema and pain with movement.  Assessment/Plan:   Cellulitis of left upper extremity: To start on IV vancomycin and Levaquin, DC Levaquin started on IV Rocephin. X-ray of the elbow shows no effusion. The case was discussed with Dr. Erlinda Hong, that recommended to treat with antibiotics for possible cellulitis as septic arthritis was unlikely as there was no effusion on imaging. She has remained afebrile with no leukocytosis, will transfer to [facility]. Culture data is pending.  End stage renal disease on dialysis [facility]) Dialyzes Monday Wednesday and Fridays, will transfer to [facility] notify renal.  Essential  Hypertension  Significantly elevated resume her home medications start hydralazine IV when necessary.     DVT prophylaxis: Lovenox Family Communication:none Disposition Plan/Barrier to D/C: [facility] Code Status:     Code Status Orders        Start     Ordered   04/22/17 0810  Full code  Continuous     04/22/17 0809    Code Status History    Date Active Date Inactive Code Status Order ID Comments User Context   11/18/2016 12:45 AM 11/21/2016  5:15 PM Full Code [id number]  Karmen Bongo, MD Inpatient   05/19/2016  4:51 PM 05/21/2016  6:34 PM Full Code [id number]  Louellen Molder, MD Inpatient   03/31/2016 11:17 PM 04/02/2016  8:45 PM Full Code [id number]  Etta Quill, DO ED   09/11/2015  9:49 PM 09/13/2015  5:01 PM Full Code [id number]  Milagros Loll, MD Inpatient   06/07/2015 10:14 PM 06/09/2015  8:59 PM Full Code [id number]  Rise Patience, MD Inpatient   02/16/2015  7:47 PM 02/18/2015  4:58 PM Full Code [id number]   Francesca Oman, DO Inpatient   02/02/2015 12:37 AM 02/02/2015  8:33 PM Full Code [id number]  Corky Sox, MD Inpatient   01/20/2015  9:37 PM 01/23/2015  2:50 PM Full Code [id number]  Jones Bales, MD Inpatient        IV Access:    Peripheral IV   Procedures and diagnostic studies:   Dg Elbow Complete Left (3+view)  Result Date: 04/22/2017 CLINICAL DATA:  31 year old female with left elbow pain.  No trauma. EXAM: LEFT ELBOW - COMPLETE 3+ VIEW COMPARISON:  None FINDINGS: There is no acute fracture or dislocation. The bones are well mineralized. No arthritic changes. Small bony spur noted along the dorsal aspect of the olecranon. There is no joint effusion. Mildly dilated superficial subcutaneous veins noted. The soft tissues appear unremarkable. IMPRESSION: No acute osseous pathology. Electronically Signed   By: Anner Crete M.D.   On: 04/22/2017 02:00     Medical Consultants:    None.  Anti-Infectives:   IV vancomycin and Rocephin.  Subjective:    Kerry Cook she relates she can move her arm slightly more. But it continues to be painful.  Objective:    Vitals:   04/21/17 1931 04/22/17 0240 04/22/17 0807  BP: (!) 160/106 (!) 174/112 (!) 174/119  Pulse: 81 86 76  Resp: 18 19 18   Temp: 98.6 F (37 C)    TempSrc: Oral    SpO2: 100% 100% 100%  Weight: 56.7 kg (125 lb)    Height: 5' (1.524 m)      Intake/Output Summary (Last 24 hours) at 04/22/17 0833 Last data filed at 04/22/17 0830  Gross per 24 hour  Intake              418 ml  Output                0 ml  Net              418 ml   Filed Weights   04/21/17 1931  Weight: 56.7 kg (125 lb)    Exam: General exam: In no acute distress. Respiratory system: Good air movement and clear to auscultation. Cardiovascular system: S1 & S2 heard, RRR. No JVD, murmurs, rubs, gallops or clicks.  Gastrointestinal system: Abdomen is nondistended, soft and nontender.  Central nervous system: Alert and oriented.  No focal neurological deficits. Extremities: No pedal edema. Skin: Some erythema around the elbow, able to flex the elbow. Psychiatry: Judgement and insight appear normal. Mood & affect appropriate.    Data Reviewed:    Labs: Basic Metabolic Panel:  Recent Labs Lab 04/22/17 0220  NA 137  K 4.9  CL 94*  CO2 28  GLUCOSE 87  BUN 28*  CREATININE 8.29*  CALCIUM 9.5   GFR Estimated Creatinine Clearance: 7.8 mL/min (A) (by C-G formula based on SCr of 8.29 mg/dL (H)). Liver Function Tests: No results for input(s): AST, ALT, ALKPHOS, BILITOT, PROT, ALBUMIN in the last 168 hours. No results for input(s): LIPASE, AMYLASE in the last 168 hours. No results for input(s): AMMONIA in the last 168 hours. Coagulation profile No results for input(s): INR, PROTIME in the last 168 hours.  CBC:  Recent Labs Lab 04/22/17 0220  WBC 8.0  NEUTROABS 5.5  HGB 11.3*  HCT 34.4*  MCV 94.8  PLT 122*   Cardiac Enzymes: No results for input(s): CKTOTAL, CKMB, CKMBINDEX, TROPONINI in the last 168 hours. BNP (last 3 results) No results for input(s): PROBNP in the last 8760 hours. CBG: No results for input(s): GLUCAP in the last 168 hours. D-Dimer: No results for input(s): DDIMER in the last 72 hours. Hgb A1c: No results for input(s): HGBA1C in the last 72 hours. Lipid Profile: No results for input(s): CHOL, HDL, LDLCALC, TRIG, CHOLHDL, LDLDIRECT in the last 72 hours. Thyroid function studies: No results for input(s): TSH, T4TOTAL, T3FREE, THYROIDAB in the last 72 hours.  Invalid input(s): FREET3 Anemia work up: No results for input(s): VITAMINB12, FOLATE, FERRITIN, TIBC, IRON, RETICCTPCT in the last 72 hours. Sepsis Labs:  Recent Labs Lab 04/22/17 0220 04/22/17 0233  WBC 8.0  --   LATICACIDVEN  --  1.00   Microbiology No results found for this or any previous visit (from the past 240 hour(s)).   Medications:   . amLODipine  10 mg Oral Daily  . calcium acetate (Phos Binder)   1,334 mg Oral TID WC  . cloNIDine  0.1 mg Oral BID  . labetalol  200 mg Oral BID  . losartan  100 mg Oral QPM  . multivitamin  1 tablet Oral QHS   Continuous Infusions: . [START ON 04/23/2017] vancomycin        LOS: 0 days   Charlynne Cousins  [facility] Pager [phone]  *Please refer to [email or web address], password [email or web address] to get updated schedule on who will round on this patient, as hospitalists switch teams weekly. If 7PM-7AM, please contact night-coverage at [email or web address], password [email or web address] for any  overnight needs.  04/22/2017, 8:33 AM

## 2017-04-23 DIAGNOSIS — L03114 Cellulitis of left upper limb: Principal | ICD-10-CM

## 2017-04-23 DIAGNOSIS — Z992 Dependence on renal dialysis: Secondary | ICD-10-CM

## 2017-04-23 DIAGNOSIS — T8610 Unspecified complication of kidney transplant: Secondary | ICD-10-CM | POA: Diagnosis not present

## 2017-04-23 DIAGNOSIS — N186 End stage renal disease: Secondary | ICD-10-CM

## 2017-04-23 DIAGNOSIS — I10 Essential (primary) hypertension: Secondary | ICD-10-CM

## 2017-04-23 LAB — COMPREHENSIVE METABOLIC PANEL
ALT: 7 U/L — ABNORMAL LOW (ref 14–54)
AST: 14 U/L — AB (ref 15–41)
Albumin: 3.4 g/dL — ABNORMAL LOW (ref 3.5–5.0)
Alkaline Phosphatase: 67 U/L (ref 38–126)
Anion gap: 15 (ref 5–15)
BILIRUBIN TOTAL: 1 mg/dL (ref 0.3–1.2)
BUN: 44 mg/dL — AB (ref 6–20)
CALCIUM: 9.7 mg/dL (ref 8.9–10.3)
CO2: 27 mmol/L (ref 22–32)
Chloride: 91 mmol/L — ABNORMAL LOW (ref 101–111)
Creatinine, Ser: 12.32 mg/dL — ABNORMAL HIGH (ref 0.44–1.00)
GFR calc Af Amer: 4 mL/min — ABNORMAL LOW (ref >60)
GFR, EST NON AFRICAN AMERICAN: 4 mL/min — AB (ref >60)
Glucose, Bld: 78 mg/dL (ref 65–99)
POTASSIUM: 5.4 mmol/L — AB (ref 3.5–5.1)
Sodium: 133 mmol/L — ABNORMAL LOW (ref 135–145)
TOTAL PROTEIN: 7.1 g/dL (ref 6.5–8.1)

## 2017-04-23 LAB — CBC
HEMATOCRIT: 33.5 % — AB (ref 36.0–46.0)
Hemoglobin: 10.7 g/dL — ABNORMAL LOW (ref 12.0–15.0)
MCH: 30.1 pg (ref 26.0–34.0)
MCHC: 31.9 g/dL (ref 30.0–36.0)
MCV: 94.1 fL (ref 78.0–100.0)
Platelets: 134 10*3/uL — ABNORMAL LOW (ref 150–400)
RBC: 3.56 MIL/uL — ABNORMAL LOW (ref 3.87–5.11)
RDW: 17.1 % — AB (ref 11.5–15.5)
WBC: 8.5 10*3/uL (ref 4.0–10.5)

## 2017-04-23 MED ORDER — PENTAFLUOROPROP-TETRAFLUOROETH EX AERO: 1.0000 "application " | INHALATION_SPRAY | CUTANEOUS | Status: DC | PRN

## 2017-04-23 MED ORDER — LIDOCAINE HCL (PF) 1 % IJ SOLN: 5.0000 mL | INTRAMUSCULAR | Status: DC | PRN

## 2017-04-23 MED ORDER — SODIUM CHLORIDE 0.9 % IV SOLN: 100.0000 mL | INTRAVENOUS | Status: DC | PRN

## 2017-04-23 MED ORDER — DOXYCYCLINE HYCLATE 100 MG PO CAPS: 100.0000 mg | ORAL_CAPSULE | Freq: Two times a day (BID) | ORAL | 0 refills | Status: DC

## 2017-04-23 MED ORDER — HEPARIN SODIUM (PORCINE) 1000 UNIT/ML DIALYSIS: 1000.0000 [IU] | INTRAMUSCULAR | Status: DC | PRN

## 2017-04-23 MED ORDER — DOXERCALCIFEROL 4 MCG/2ML IV SOLN
INTRAVENOUS | Status: AC
  Filled 2017-04-23: qty 2

## 2017-04-23 MED ORDER — DOXERCALCIFEROL 4 MCG/2ML IV SOLN
3.0000 ug | INTRAVENOUS | Status: DC
  Administered 2017-04-23: 3 ug via INTRAVENOUS

## 2017-04-23 MED ORDER — LIDOCAINE-PRILOCAINE 2.5-2.5 % EX CREA: 1.0000 "application " | TOPICAL_CREAM | CUTANEOUS | Status: DC | PRN

## 2017-04-23 NOTE — Discharge Instructions (Signed)

## 2017-04-23 NOTE — Consult Note (Signed)
[facility] Renal Consultation Note    Indication for Consultation:  Management of ESRD/hemodialysis; anemia, hypertension/volume and secondary hyperparathyroidism PCP:  HPI: Kerry Cook is a 31 y.o. female with ESRD on HD MWF. ESRD secondary to HTN, HD initiated 2009, failed renal transplant 2012, HD resumed 2016.   She is admitted for treatment of left elbow pain and swelling x 3-4 days. She had an outpatient xray done 10/29 with no acute findings. Presented to [facility] ED later that day with worsening swelling and had one dose IV Vanc/Fortaz.  Transferred to [facility] today for further eval/dialysis needs.  She denies any trauma to the arm, denies fever, chills. VSS. Labs stable with WBC 8.5 Hgb 10.7 Na 133. K 5.4. Blood cultures pending.   Seen in room. She says her elbow has improved and she able to bend it now, she wasn't able to yesterday. She is overall feeling better and wondering when she can go home.   Dialyzes at [facility]. Last HD was Monday. She has been compliant with HD. No issues with L forearm AVF.   Past Medical History:  Diagnosis Date  . Chronic kidney disease   . Dialysis patient ([place])   . History of kidney transplant 2012   kidney failure due to hypertension  . Hypertension   . Multiple gastric ulcers   . Pneumonia 01/23/2015   Past Surgical History:  Procedure Laterality Date  . ESOPHAGOGASTRODUODENOSCOPY N/A 04/01/2016   Procedure: ESOPHAGOGASTRODUODENOSCOPY (EGD);  Surgeon: Gatha Mayer, MD;  Location: [facility] ENDOSCOPY;  Service: Endoscopy;  Laterality: N/A;  . ESOPHAGOGASTRODUODENOSCOPY (EGD) WITH PROPOFOL N/A 11/20/2016   Procedure: ESOPHAGOGASTRODUODENOSCOPY (EGD) WITH PROPOFOL;  Surgeon: Doran Stabler, MD;  Location: [place];  Service: Endoscopy;  Laterality: N/A;  . KIDNEY TRANSPLANT Bilateral 2012   Family History  Problem Relation Age of Onset  . Kidney disease Father   . Lupus Maternal Grandmother   . Cancer  Maternal Grandfather   . Colon cancer Neg Hx   . Stomach cancer Neg Hx   . Rectal cancer Neg Hx   . Esophageal cancer Neg Hx   . Liver cancer Neg Hx    Social History:  reports that she has never smoked. She has never used smokeless tobacco. She reports that she does not drink alcohol or use drugs. Allergies  Allergen Reactions  . Zosyn [Piperacillin Sod-Tazobactam So] Itching   Prior to Admission medications   Medication Sig Start Date End Date Taking? Authorizing Provider  acetaminophen (TYLENOL) 325 MG tablet Take 650 mg by mouth every 6 (six) hours as needed for headache (pain).   Yes [provider]  amLODipine (NORVASC) 10 MG tablet Take 1 tablet (10 mg total) by mouth every evening. Patient taking differently: Take 10 mg by mouth daily.  09/13/15  Yes [name], MD  calcium acetate, Phos Binder, (PHOSLYRA) 667 MG/5ML SOLN Take 1,334 mg by mouth 3 (three) times daily with meals.   Yes [provider]  cloNIDine (CATAPRES) 0.1 MG tablet Take 0.1 mg by mouth 2 (two) times daily.   Yes [provider]  labetalol (NORMODYNE) 200 MG tablet Take 1 tablet (200 mg total) by mouth 2 (two) times daily. 04/02/16  Yes Elgergawy, Silver Huguenin, MD  losartan (COZAAR) 100 MG tablet Take 1 tablet (100 mg total) by mouth every evening. 05/21/16  Yes Verlee Monte, MD  multivitamin (RENA-VIT) TABS tablet Take 1 tablet by mouth at bedtime. 06/09/15  Yes Debbe Odea, MD  ondansetron (ZOFRAN ODT) 8 MG disintegrating tablet Take 1 tablet (8 mg total) by mouth every 8 (eight) hours as needed for nausea or vomiting. 03/24/16  Yes Jola Schmidt, MD  Simethicone (GAS-X PO) Take 1 tablet by mouth daily as needed (flatulence/bloatin).   Yes [provider]   Current Facility-Administered Medications  Medication Dose Route Frequency Provider Last Rate Last Dose  . acetaminophen (TYLENOL) tablet 650 mg  650 mg Oral Q6H PRN Jani Gravel, MD       Or  . acetaminophen  (TYLENOL) suppository 650 mg  650 mg Rectal Q6H PRN Jani Gravel, MD      . amLODipine (NORVASC) tablet 10 mg  10 mg Oral Daily Jani Gravel, MD   10 mg at 04/22/17 0912  . calcium acetate (PHOSLO) capsule 1,334 mg  1,334 mg Oral TID WC Jani Gravel, MD   1,334 mg at 04/23/17 1696  . cloNIDine (CATAPRES) tablet 0.1 mg  0.1 mg Oral BID Jani Gravel, MD   0.1 mg at 04/22/17 2147  . heparin injection 5,000 Units  5,000 Units Subcutaneous Q8H Jani Gravel, MD      . hydrALAZINE (APRESOLINE) injection 5 mg  5 mg Intravenous Q6H PRN Charlynne Cousins, MD   0.25 mg at 04/23/17 0825  . labetalol (NORMODYNE) tablet 200 mg  200 mg Oral BID Jani Gravel, MD   200 mg at 04/22/17 2147  . losartan (COZAAR) tablet 100 mg  100 mg Oral QPM Jani Gravel, MD   100 mg at 04/22/17 1810  . multivitamin (RENA-VIT) tablet 1 tablet  1 tablet Oral Loma Sousa, MD   1 tablet at 04/22/17 2147  . ondansetron (ZOFRAN-ODT) disintegrating tablet 8 mg  8 mg Oral Q8H PRN Jani Gravel, MD        ROS: As per HPI otherwise negative.  Physical Exam: Vitals:   04/22/17 1751 04/22/17 2107 04/23/17 0600 04/23/17 0900  BP: (!) 172/108 (!) 170/111 (!) 171/105 (!) 172/110  Pulse: 88 91 77 86  Resp: 16 16 18 18   Temp: 98.5 F (36.9 C) 97.8 F (36.6 C) 98.3 F (36.8 C) 98.4 F (36.9 C)  TempSrc: Oral Oral  Oral  SpO2: 99% 100% 97% 98%  Weight:      Height:         General: WDWN NAD Head: NCAT sclera not icteric MMM Neck: Supple. No JVD  Lungs: CTAB Heart: RRR with S1 S2 Abdomen: soft NT + BS Upper extremities: L elbow exam overall normal, no swelling, erythema, mild tenderness with flexing  Lower extremities:without edema or ischemic changes, no open wounds  Neuro: A & O  X 3. Moves all extremities spontaneously. Psych:  Responds to questions appropriately with a normal affect. Dialysis Access: L forearm AVF, small aneurysms, +bruit   Labs: Basic Metabolic Panel:  Recent Labs Lab 04/22/17 0220 04/23/17 0220  NA 137 133*   K 4.9 5.4*  CL 94* 91*  CO2 28 27  GLUCOSE 87 78  BUN 28* 44*  CREATININE 8.29* 12.32*  CALCIUM 9.5 9.7   Liver Function Tests:  Recent Labs Lab 04/23/17 0220  AST 14*  ALT 7*  ALKPHOS 67  BILITOT 1.0  PROT 7.1  ALBUMIN 3.4*   No results for input(s): LIPASE, AMYLASE in the last 168 hours. No results for input(s): AMMONIA in the last 168 hours. CBC:  Recent Labs Lab 04/22/17 0220 04/23/17 0220  WBC 8.0 8.5  NEUTROABS 5.5  --   HGB 11.3* 10.7*  HCT 34.4* 33.5*  MCV 94.8 94.1  PLT 122* 134*   Cardiac Enzymes: No results for input(s): CKTOTAL, CKMB, CKMBINDEX, TROPONINI in the last 168 hours. CBG: No results for input(s): GLUCAP in the last 168 hours. Iron Studies: No results for input(s): IRON, TIBC, TRANSFERRIN, FERRITIN in the last 72 hours. Studies/Results: Dg Elbow Complete Left (3+view)  Result Date: 04/22/2017 CLINICAL DATA:  31 year old female with left elbow pain.  No trauma. EXAM: LEFT ELBOW - COMPLETE 3+ VIEW COMPARISON:  None FINDINGS: There is no acute fracture or dislocation. The bones are well mineralized. No arthritic changes. Small bony spur noted along the dorsal aspect of the olecranon. There is no joint effusion. Mildly dilated superficial subcutaneous veins noted. The soft tissues appear unremarkable. IMPRESSION: No acute osseous pathology. Electronically Signed   By: Anner Crete M.D.   On: 04/22/2017 02:00    Dialysis Orders:  AF MWF 3.75h 180F BFR 400/A1.5x EDW 57.5kg 2K/2.25Ca L AVF No heparin Hectorol 3 mcg IV tiw BMM: Ca acetate 3 tabs q ac, Fosrenol 2 tabs q ac   Assessment/Plan: 1.  L elbow pain/swelling - No effusion/fracture on xray. IV antibiotics dosed at [facility] before transfer. Further abx per primary. Blood cultures pending  2.  ESRD -  MWF For HD on schedule  3.  Hypertension/volume  - BP elevated, cont home meds/For HD today UF goal 2-3L  4.  Anemia  - Hgb 10.7 Not on ESA  5.  Metabolic bone disease -  Cont VDRA/Fosrenol  binder  6.  Nutrition - Renal diet/vitamins  Lynnda Child PA-C [facility] Pager [phone] 04/23/2017, 10:36 AM   Pt seen, examined and agree w A/P as above. ESRD pt with L elbow pain and redness, admitted and started on IV vanc/ zosyn.  Redness and pain today are much better. Will have HD today.  Kelly Splinter MD [employer] pager [phone]   04/23/2017, 2:20 PM

## 2017-04-23 NOTE — Progress Notes (Signed)
Patient being discharged home. RN reviewed discharge instructions, medication, follow up appointments with patient and patient verbalized understanding. RN discontinued telemetry monitor, IV removed, belongings packed and sent home with patient. Patient escorted out to ride by NT.

## 2017-04-23 NOTE — Discharge Summary (Signed)
Physician Discharge Summary  Kerry Cook NLZ:[id number] DOB: 1985-09-13 DOA: 04/22/2017  PCP: Patient, No Pcp Per  Admit date: 04/22/2017 Discharge date: 04/23/2017  Time spent: 45 minutes  Recommendations for Outpatient Follow-up:  Patient will be discharged to home.  Patient will need to follow up with primary care provider within one week of discharge. Continue dialysis as scheduled and monitor potassium. Patient should continue medications as prescribed.  Patient should follow a renal diet with 1211ml fluid restriction per day.  Discharge Diagnoses:  Cellulitis of left upper extremity End stage renal disease on dialysis [facility]) Essential  Hypertension  Discharge Condition: stable  Diet recommendation: renal   Filed Weights   04/21/17 1931 04/23/17 1348  Weight: 56.7 kg (125 lb) 61.1 kg (134 lb 11.2 oz)    History of present illness:  On 04/22/2017 by Dr. Jani Gravel Kerry Cook  is a 31 y.o. female, w Hypertension,  ESRD on HD (M,W, F), apparently c/o left elbow redness and pain over the past 3 days .  Pt denies fever, chlls.    Hospital Course:  Cellulitis of left upper extremity -Started on vancomycin and Levaquin, transitioned to Rocephin -X-ray showed no effusion -Case discussed with orthopedics, Dr. Erlinda Hong, recommended using antibiotics for treatment of possible cellulitis, septic arthritis unlikely as there is no effusion on imaging -Currently afebrile with no leukocytosis -Blood culture show no growth to date -Discharged with doxycycline  End stage renal disease on dialysis [facility]) -Dialyzes Monday Wednesday and Friday -Nephrology consulted and appreciated -Patient dialyzed today  Essential  Hypertension -Continue amlodipine, clonidine, labetalol, losartan  Hyperkalemia -Suspect will correct with HD -repeat BMP with next HD session  Procedures: None  Consultations: Nephrology Orthopedic surgery, via phone  Discharge Exam: Vitals:   04/23/17  1500 04/23/17 1530  BP: (!) 155/90 (!) 147/84  Pulse: 80 77  Resp:    Temp:    SpO2:     Feeling mildly better. Discontinue to have some swelling around her elbow. Denies chest pain, shortness of breath, abdominal pain, nausea or vomiting, diarrhea or constipation, dizziness or headache.   General: Well developed, well nourished, NAD, appears stated age  HEENT: NCAT, mucous membranes moist.  Cardiovascular: S1 S2 auscultated, RRR, no murmur  Respiratory: Clear to auscultation bilaterally with equal chest rise  Abdomen: Soft, nontender, nondistended, + bowel sounds  Extremities: warm dry without cyanosis clubbing or edema. 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 No erythema or edema of left elbow  Neuro: AAOx3, nonfocal  Psych: Normal affect and demeanor with intact judgement and insight, pleasant  Discharge Instructions Discharge Instructions    Discharge instructions    Complete by:  As directed    Patient will be discharged to home.  Patient will need to follow up with primary care provider within one week of discharge. Continue dialysis as scheduled and monitor potassium. Patient should continue medications as prescribed.  Patient should follow a renal diet with 1271ml fluid restriction per day.     Current Discharge Medication List    START taking these medications   Details  doxycycline (VIBRAMYCIN) 100 MG capsule Take 1 capsule (100 mg total) by mouth 2 (two) times daily. Qty: 20 capsule, Refills: 0      CONTINUE these medications which have NOT CHANGED   Details  acetaminophen (TYLENOL) 325 MG tablet Take 650 mg by mouth every 6 (six) hours as needed for headache (pain).    amLODipine (NORVASC) 10 MG tablet Take 1 tablet (10 mg total) by  mouth every evening. Qty: 30 tablet, Refills: 0    calcium acetate, Phos Binder, (PHOSLYRA) 667 MG/5ML SOLN Take 1,334 mg by mouth 3 (three) times daily with meals.    cloNIDine (CATAPRES) 0.1 MG tablet Take 0.1 mg by mouth 2  (two) times daily.    labetalol (NORMODYNE) 200 MG tablet Take 1 tablet (200 mg total) by mouth 2 (two) times daily. Qty: 60 tablet, Refills: 1    losartan (COZAAR) 100 MG tablet Take 1 tablet (100 mg total) by mouth every evening. Qty: 30 tablet, Refills: 0    multivitamin (RENA-VIT) TABS tablet Take 1 tablet by mouth at bedtime. Qty: 30 each, Refills: 0    ondansetron (ZOFRAN ODT) 8 MG disintegrating tablet Take 1 tablet (8 mg total) by mouth every 8 (eight) hours as needed for nausea or vomiting. Qty: 10 tablet, Refills: 0    Simethicone (GAS-X PO) Take 1 tablet by mouth daily as needed (flatulence/bloatin).       Allergies  Allergen Reactions  . Zosyn [Piperacillin Sod-Tazobactam So] Itching   Follow-up Information    [name], FNP Follow up.   Specialty:  Family Medicine Contact information: [address] [place] [address] [phone]            The results of significant diagnostics from this hospitalization (including imaging, microbiology, ancillary and laboratory) are listed below for reference.    Significant Diagnostic Studies: Dg Elbow Complete Left (3+view)  Result Date: 04/22/2017 CLINICAL DATA:  31 year old female with left elbow pain.  No trauma. EXAM: LEFT ELBOW - COMPLETE 3+ VIEW COMPARISON:  None FINDINGS: There is no acute fracture or dislocation. The bones are well mineralized. No arthritic changes. Small bony spur noted along the dorsal aspect of the olecranon. There is no joint effusion. Mildly dilated superficial subcutaneous veins noted. The soft tissues appear unremarkable. IMPRESSION: No acute osseous pathology. Electronically Signed   By: Anner Crete M.D.   On: 04/22/2017 02:00    Microbiology: Recent Results (from the past 240 hour(s))  Blood culture (routine x 2)     Status: None (Preliminary result)   Collection Time: 04/22/17  2:20 AM  Result Value Ref Range Status   Specimen Description BLOOD RIGHT HAND   Final   Special Requests   Final    BOTTLES DRAWN AEROBIC ONLY Blood Culture adequate volume   Culture   Final    NO GROWTH 1 DAY Performed at [facility], [address], [place], [place] [address]    Report Status PENDING  Incomplete  MRSA PCR Screening     Status: None   Collection Time: 04/22/17  6:04 PM  Result Value Ref Range Status   MRSA by PCR NEGATIVE NEGATIVE Final    Comment:        The GeneXpert MRSA Assay (FDA approved for NASAL specimens only), is one component of a comprehensive MRSA colonization surveillance program. It is not intended to diagnose MRSA infection nor to guide or monitor treatment for MRSA infections.      Labs: Basic Metabolic Panel:  Recent Labs Lab 04/22/17 0220 04/23/17 0220  NA 137 133*  K 4.9 5.4*  CL 94* 91*  CO2 28 27  GLUCOSE 87 78  BUN 28* 44*  CREATININE 8.29* 12.32*  CALCIUM 9.5 9.7   Liver Function Tests:  Recent Labs Lab 04/23/17 0220  AST 14*  ALT 7*  ALKPHOS 67  BILITOT 1.0  PROT 7.1  ALBUMIN 3.4*  No results for input(s): LIPASE, AMYLASE in the last 168 hours. No results for input(s): AMMONIA in the last 168 hours. CBC:  Recent Labs Lab 04/22/17 0220 04/23/17 0220  WBC 8.0 8.5  NEUTROABS 5.5  --   HGB 11.3* 10.7*  HCT 34.4* 33.5*  MCV 94.8 94.1  PLT 122* 134*   Cardiac Enzymes: No results for input(s): CKTOTAL, CKMB, CKMBINDEX, TROPONINI in the last 168 hours. BNP: BNP (last 3 results) No results for input(s): BNP in the last 8760 hours.  ProBNP (last 3 results) No results for input(s): PROBNP in the last 8760 hours.  CBG: No results for input(s): GLUCAP in the last 168 hours.     SignedCristal Ford  [facility] 04/23/2017, 5:04 PM

## 2017-04-23 NOTE — Procedures (Signed)
   I was present at this dialysis session, have reviewed the session itself and made  appropriate changes Kelly Splinter MD [place] pager [phone]   04/23/2017, 2:28 PM

## 2017-04-25 DIAGNOSIS — N2581 Secondary hyperparathyroidism of renal origin: Secondary | ICD-10-CM | POA: Diagnosis not present

## 2017-04-25 DIAGNOSIS — N186 End stage renal disease: Secondary | ICD-10-CM | POA: Diagnosis not present

## 2017-04-25 DIAGNOSIS — D631 Anemia in chronic kidney disease: Secondary | ICD-10-CM | POA: Diagnosis not present

## 2017-04-27 LAB — CULTURE, BLOOD (ROUTINE X 2)
CULTURE: NO GROWTH
SPECIAL REQUESTS: ADEQUATE

## 2017-04-28 DIAGNOSIS — N2581 Secondary hyperparathyroidism of renal origin: Secondary | ICD-10-CM | POA: Diagnosis not present

## 2017-04-28 DIAGNOSIS — N186 End stage renal disease: Secondary | ICD-10-CM | POA: Diagnosis not present

## 2017-04-28 DIAGNOSIS — D631 Anemia in chronic kidney disease: Secondary | ICD-10-CM | POA: Diagnosis not present

## 2017-04-30 DIAGNOSIS — D631 Anemia in chronic kidney disease: Secondary | ICD-10-CM | POA: Diagnosis not present

## 2017-04-30 DIAGNOSIS — N186 End stage renal disease: Secondary | ICD-10-CM | POA: Diagnosis not present

## 2017-04-30 DIAGNOSIS — N2581 Secondary hyperparathyroidism of renal origin: Secondary | ICD-10-CM | POA: Diagnosis not present

## 2017-05-02 DIAGNOSIS — N186 End stage renal disease: Secondary | ICD-10-CM | POA: Diagnosis not present

## 2017-05-02 DIAGNOSIS — N2581 Secondary hyperparathyroidism of renal origin: Secondary | ICD-10-CM | POA: Diagnosis not present

## 2017-05-02 DIAGNOSIS — D631 Anemia in chronic kidney disease: Secondary | ICD-10-CM | POA: Diagnosis not present

## 2017-05-05 DIAGNOSIS — N186 End stage renal disease: Secondary | ICD-10-CM | POA: Diagnosis not present

## 2017-05-05 DIAGNOSIS — D631 Anemia in chronic kidney disease: Secondary | ICD-10-CM | POA: Diagnosis not present

## 2017-05-05 DIAGNOSIS — N2581 Secondary hyperparathyroidism of renal origin: Secondary | ICD-10-CM | POA: Diagnosis not present

## 2017-05-07 DIAGNOSIS — N186 End stage renal disease: Secondary | ICD-10-CM | POA: Diagnosis not present

## 2017-05-07 DIAGNOSIS — D631 Anemia in chronic kidney disease: Secondary | ICD-10-CM | POA: Diagnosis not present

## 2017-05-07 DIAGNOSIS — N2581 Secondary hyperparathyroidism of renal origin: Secondary | ICD-10-CM | POA: Diagnosis not present

## 2017-05-09 DIAGNOSIS — N186 End stage renal disease: Secondary | ICD-10-CM | POA: Diagnosis not present

## 2017-05-09 DIAGNOSIS — N2581 Secondary hyperparathyroidism of renal origin: Secondary | ICD-10-CM | POA: Diagnosis not present

## 2017-05-09 DIAGNOSIS — D631 Anemia in chronic kidney disease: Secondary | ICD-10-CM | POA: Diagnosis not present

## 2017-05-11 DIAGNOSIS — N2581 Secondary hyperparathyroidism of renal origin: Secondary | ICD-10-CM | POA: Diagnosis not present

## 2017-05-11 DIAGNOSIS — N186 End stage renal disease: Secondary | ICD-10-CM | POA: Diagnosis not present

## 2017-05-11 DIAGNOSIS — D631 Anemia in chronic kidney disease: Secondary | ICD-10-CM | POA: Diagnosis not present

## 2017-05-13 DIAGNOSIS — N186 End stage renal disease: Secondary | ICD-10-CM | POA: Diagnosis not present

## 2017-05-13 DIAGNOSIS — N2581 Secondary hyperparathyroidism of renal origin: Secondary | ICD-10-CM | POA: Diagnosis not present

## 2017-05-13 DIAGNOSIS — D631 Anemia in chronic kidney disease: Secondary | ICD-10-CM | POA: Diagnosis not present

## 2017-05-16 DIAGNOSIS — D631 Anemia in chronic kidney disease: Secondary | ICD-10-CM | POA: Diagnosis not present

## 2017-05-16 DIAGNOSIS — N2581 Secondary hyperparathyroidism of renal origin: Secondary | ICD-10-CM | POA: Diagnosis not present

## 2017-05-16 DIAGNOSIS — N186 End stage renal disease: Secondary | ICD-10-CM | POA: Diagnosis not present

## 2017-05-19 DIAGNOSIS — N186 End stage renal disease: Secondary | ICD-10-CM | POA: Diagnosis not present

## 2017-05-19 DIAGNOSIS — N2581 Secondary hyperparathyroidism of renal origin: Secondary | ICD-10-CM | POA: Diagnosis not present

## 2017-05-19 DIAGNOSIS — D631 Anemia in chronic kidney disease: Secondary | ICD-10-CM | POA: Diagnosis not present

## 2017-05-21 DIAGNOSIS — N2581 Secondary hyperparathyroidism of renal origin: Secondary | ICD-10-CM | POA: Diagnosis not present

## 2017-05-21 DIAGNOSIS — D631 Anemia in chronic kidney disease: Secondary | ICD-10-CM | POA: Diagnosis not present

## 2017-05-21 DIAGNOSIS — N186 End stage renal disease: Secondary | ICD-10-CM | POA: Diagnosis not present

## 2017-05-23 DIAGNOSIS — N186 End stage renal disease: Secondary | ICD-10-CM | POA: Diagnosis not present

## 2017-05-23 DIAGNOSIS — D631 Anemia in chronic kidney disease: Secondary | ICD-10-CM | POA: Diagnosis not present

## 2017-05-23 DIAGNOSIS — Z992 Dependence on renal dialysis: Secondary | ICD-10-CM | POA: Diagnosis not present

## 2017-05-23 DIAGNOSIS — N2581 Secondary hyperparathyroidism of renal origin: Secondary | ICD-10-CM | POA: Diagnosis not present

## 2017-05-23 DIAGNOSIS — T8610 Unspecified complication of kidney transplant: Secondary | ICD-10-CM | POA: Diagnosis not present

## 2017-05-26 DIAGNOSIS — N186 End stage renal disease: Secondary | ICD-10-CM | POA: Diagnosis not present

## 2017-05-26 DIAGNOSIS — D631 Anemia in chronic kidney disease: Secondary | ICD-10-CM | POA: Diagnosis not present

## 2017-05-26 DIAGNOSIS — N2581 Secondary hyperparathyroidism of renal origin: Secondary | ICD-10-CM | POA: Diagnosis not present

## 2017-05-28 DIAGNOSIS — N186 End stage renal disease: Secondary | ICD-10-CM | POA: Diagnosis not present

## 2017-05-28 DIAGNOSIS — D631 Anemia in chronic kidney disease: Secondary | ICD-10-CM | POA: Diagnosis not present

## 2017-05-28 DIAGNOSIS — N2581 Secondary hyperparathyroidism of renal origin: Secondary | ICD-10-CM | POA: Diagnosis not present

## 2017-05-30 DIAGNOSIS — D631 Anemia in chronic kidney disease: Secondary | ICD-10-CM | POA: Diagnosis not present

## 2017-05-30 DIAGNOSIS — N2581 Secondary hyperparathyroidism of renal origin: Secondary | ICD-10-CM | POA: Diagnosis not present

## 2017-05-30 DIAGNOSIS — N186 End stage renal disease: Secondary | ICD-10-CM | POA: Diagnosis not present

## 2017-06-03 DIAGNOSIS — D631 Anemia in chronic kidney disease: Secondary | ICD-10-CM | POA: Diagnosis not present

## 2017-06-03 DIAGNOSIS — N186 End stage renal disease: Secondary | ICD-10-CM | POA: Diagnosis not present

## 2017-06-03 DIAGNOSIS — N2581 Secondary hyperparathyroidism of renal origin: Secondary | ICD-10-CM | POA: Diagnosis not present

## 2017-06-04 DIAGNOSIS — D631 Anemia in chronic kidney disease: Secondary | ICD-10-CM | POA: Diagnosis not present

## 2017-06-04 DIAGNOSIS — N2581 Secondary hyperparathyroidism of renal origin: Secondary | ICD-10-CM | POA: Diagnosis not present

## 2017-06-04 DIAGNOSIS — N186 End stage renal disease: Secondary | ICD-10-CM | POA: Diagnosis not present

## 2017-06-06 DIAGNOSIS — D631 Anemia in chronic kidney disease: Secondary | ICD-10-CM | POA: Diagnosis not present

## 2017-06-06 DIAGNOSIS — N186 End stage renal disease: Secondary | ICD-10-CM | POA: Diagnosis not present

## 2017-06-06 DIAGNOSIS — N2581 Secondary hyperparathyroidism of renal origin: Secondary | ICD-10-CM | POA: Diagnosis not present

## 2017-06-09 DIAGNOSIS — N186 End stage renal disease: Secondary | ICD-10-CM | POA: Diagnosis not present

## 2017-06-09 DIAGNOSIS — N2581 Secondary hyperparathyroidism of renal origin: Secondary | ICD-10-CM | POA: Diagnosis not present

## 2017-06-09 DIAGNOSIS — D631 Anemia in chronic kidney disease: Secondary | ICD-10-CM | POA: Diagnosis not present

## 2017-06-11 DIAGNOSIS — N186 End stage renal disease: Secondary | ICD-10-CM | POA: Diagnosis not present

## 2017-06-11 DIAGNOSIS — N2581 Secondary hyperparathyroidism of renal origin: Secondary | ICD-10-CM | POA: Diagnosis not present

## 2017-06-11 DIAGNOSIS — D631 Anemia in chronic kidney disease: Secondary | ICD-10-CM | POA: Diagnosis not present

## 2017-06-13 DIAGNOSIS — N2581 Secondary hyperparathyroidism of renal origin: Secondary | ICD-10-CM | POA: Diagnosis not present

## 2017-06-13 DIAGNOSIS — N186 End stage renal disease: Secondary | ICD-10-CM | POA: Diagnosis not present

## 2017-06-13 DIAGNOSIS — D631 Anemia in chronic kidney disease: Secondary | ICD-10-CM | POA: Diagnosis not present

## 2017-06-15 DIAGNOSIS — D631 Anemia in chronic kidney disease: Secondary | ICD-10-CM | POA: Diagnosis not present

## 2017-06-15 DIAGNOSIS — N2581 Secondary hyperparathyroidism of renal origin: Secondary | ICD-10-CM | POA: Diagnosis not present

## 2017-06-15 DIAGNOSIS — N186 End stage renal disease: Secondary | ICD-10-CM | POA: Diagnosis not present

## 2017-06-18 DIAGNOSIS — N2581 Secondary hyperparathyroidism of renal origin: Secondary | ICD-10-CM | POA: Diagnosis not present

## 2017-06-18 DIAGNOSIS — D631 Anemia in chronic kidney disease: Secondary | ICD-10-CM | POA: Diagnosis not present

## 2017-06-18 DIAGNOSIS — N186 End stage renal disease: Secondary | ICD-10-CM | POA: Diagnosis not present

## 2017-06-20 DIAGNOSIS — N2581 Secondary hyperparathyroidism of renal origin: Secondary | ICD-10-CM | POA: Diagnosis not present

## 2017-06-20 DIAGNOSIS — N186 End stage renal disease: Secondary | ICD-10-CM | POA: Diagnosis not present

## 2017-06-20 DIAGNOSIS — D631 Anemia in chronic kidney disease: Secondary | ICD-10-CM | POA: Diagnosis not present

## 2017-06-22 DIAGNOSIS — N2581 Secondary hyperparathyroidism of renal origin: Secondary | ICD-10-CM | POA: Diagnosis not present

## 2017-06-22 DIAGNOSIS — D631 Anemia in chronic kidney disease: Secondary | ICD-10-CM | POA: Diagnosis not present

## 2017-06-22 DIAGNOSIS — N186 End stage renal disease: Secondary | ICD-10-CM | POA: Diagnosis not present

## 2017-06-23 DIAGNOSIS — N186 End stage renal disease: Secondary | ICD-10-CM | POA: Diagnosis not present

## 2017-06-23 DIAGNOSIS — T8610 Unspecified complication of kidney transplant: Secondary | ICD-10-CM | POA: Diagnosis not present

## 2017-06-23 DIAGNOSIS — Z992 Dependence on renal dialysis: Secondary | ICD-10-CM | POA: Diagnosis not present

## 2017-06-25 DIAGNOSIS — H3589 Other specified retinal disorders: Secondary | ICD-10-CM | POA: Diagnosis not present

## 2017-06-25 DIAGNOSIS — H35033 Hypertensive retinopathy, bilateral: Secondary | ICD-10-CM | POA: Diagnosis not present

## 2017-06-25 DIAGNOSIS — N186 End stage renal disease: Secondary | ICD-10-CM | POA: Diagnosis not present

## 2017-06-25 DIAGNOSIS — N2581 Secondary hyperparathyroidism of renal origin: Secondary | ICD-10-CM | POA: Diagnosis not present

## 2017-06-26 DIAGNOSIS — R9431 Abnormal electrocardiogram [ECG] [EKG]: Secondary | ICD-10-CM | POA: Diagnosis not present

## 2017-06-27 DIAGNOSIS — N2581 Secondary hyperparathyroidism of renal origin: Secondary | ICD-10-CM | POA: Diagnosis not present

## 2017-06-27 DIAGNOSIS — N186 End stage renal disease: Secondary | ICD-10-CM | POA: Diagnosis not present

## 2017-06-30 DIAGNOSIS — N186 End stage renal disease: Secondary | ICD-10-CM | POA: Diagnosis not present

## 2017-06-30 DIAGNOSIS — N2581 Secondary hyperparathyroidism of renal origin: Secondary | ICD-10-CM | POA: Diagnosis not present

## 2017-07-02 DIAGNOSIS — N2581 Secondary hyperparathyroidism of renal origin: Secondary | ICD-10-CM | POA: Diagnosis not present

## 2017-07-02 DIAGNOSIS — N186 End stage renal disease: Secondary | ICD-10-CM | POA: Diagnosis not present

## 2017-07-04 DIAGNOSIS — N186 End stage renal disease: Secondary | ICD-10-CM | POA: Diagnosis not present

## 2017-07-04 DIAGNOSIS — N2581 Secondary hyperparathyroidism of renal origin: Secondary | ICD-10-CM | POA: Diagnosis not present

## 2017-07-07 DIAGNOSIS — N2581 Secondary hyperparathyroidism of renal origin: Secondary | ICD-10-CM | POA: Diagnosis not present

## 2017-07-07 DIAGNOSIS — N186 End stage renal disease: Secondary | ICD-10-CM | POA: Diagnosis not present

## 2017-07-08 DIAGNOSIS — N2581 Secondary hyperparathyroidism of renal origin: Secondary | ICD-10-CM | POA: Diagnosis not present

## 2017-07-08 DIAGNOSIS — N186 End stage renal disease: Secondary | ICD-10-CM | POA: Diagnosis not present

## 2017-07-09 DIAGNOSIS — Z992 Dependence on renal dialysis: Secondary | ICD-10-CM | POA: Diagnosis not present

## 2017-07-09 DIAGNOSIS — N186 End stage renal disease: Secondary | ICD-10-CM | POA: Diagnosis not present

## 2017-07-09 DIAGNOSIS — Z79899 Other long term (current) drug therapy: Secondary | ICD-10-CM | POA: Diagnosis not present

## 2017-07-09 DIAGNOSIS — R9439 Abnormal result of other cardiovascular function study: Secondary | ICD-10-CM | POA: Diagnosis not present

## 2017-07-09 DIAGNOSIS — Z7682 Awaiting organ transplant status: Secondary | ICD-10-CM | POA: Diagnosis not present

## 2017-07-09 DIAGNOSIS — Z955 Presence of coronary angioplasty implant and graft: Secondary | ICD-10-CM | POA: Diagnosis not present

## 2017-07-09 DIAGNOSIS — I120 Hypertensive chronic kidney disease with stage 5 chronic kidney disease or end stage renal disease: Secondary | ICD-10-CM | POA: Diagnosis not present

## 2017-07-09 DIAGNOSIS — I2510 Atherosclerotic heart disease of native coronary artery without angina pectoris: Secondary | ICD-10-CM | POA: Diagnosis not present

## 2017-07-11 DIAGNOSIS — N2581 Secondary hyperparathyroidism of renal origin: Secondary | ICD-10-CM | POA: Diagnosis not present

## 2017-07-11 DIAGNOSIS — N186 End stage renal disease: Secondary | ICD-10-CM | POA: Diagnosis not present

## 2017-07-14 DIAGNOSIS — N186 End stage renal disease: Secondary | ICD-10-CM | POA: Diagnosis not present

## 2017-07-14 DIAGNOSIS — N2581 Secondary hyperparathyroidism of renal origin: Secondary | ICD-10-CM | POA: Diagnosis not present

## 2017-07-16 DIAGNOSIS — N2581 Secondary hyperparathyroidism of renal origin: Secondary | ICD-10-CM | POA: Diagnosis not present

## 2017-07-16 DIAGNOSIS — N186 End stage renal disease: Secondary | ICD-10-CM | POA: Diagnosis not present

## 2017-07-18 DIAGNOSIS — N2581 Secondary hyperparathyroidism of renal origin: Secondary | ICD-10-CM | POA: Diagnosis not present

## 2017-07-18 DIAGNOSIS — N186 End stage renal disease: Secondary | ICD-10-CM | POA: Diagnosis not present

## 2017-07-21 DIAGNOSIS — N186 End stage renal disease: Secondary | ICD-10-CM | POA: Diagnosis not present

## 2017-07-21 DIAGNOSIS — N2581 Secondary hyperparathyroidism of renal origin: Secondary | ICD-10-CM | POA: Diagnosis not present

## 2017-07-23 DIAGNOSIS — N2581 Secondary hyperparathyroidism of renal origin: Secondary | ICD-10-CM | POA: Diagnosis not present

## 2017-07-23 DIAGNOSIS — N186 End stage renal disease: Secondary | ICD-10-CM | POA: Diagnosis not present

## 2017-07-24 DIAGNOSIS — Z992 Dependence on renal dialysis: Secondary | ICD-10-CM | POA: Diagnosis not present

## 2017-07-24 DIAGNOSIS — T8610 Unspecified complication of kidney transplant: Secondary | ICD-10-CM | POA: Diagnosis not present

## 2017-07-24 DIAGNOSIS — N186 End stage renal disease: Secondary | ICD-10-CM | POA: Diagnosis not present

## 2017-07-25 DIAGNOSIS — Z992 Dependence on renal dialysis: Secondary | ICD-10-CM | POA: Diagnosis not present

## 2017-07-25 DIAGNOSIS — D631 Anemia in chronic kidney disease: Secondary | ICD-10-CM | POA: Diagnosis not present

## 2017-07-25 DIAGNOSIS — N2581 Secondary hyperparathyroidism of renal origin: Secondary | ICD-10-CM | POA: Diagnosis not present

## 2017-07-25 DIAGNOSIS — T8610 Unspecified complication of kidney transplant: Secondary | ICD-10-CM | POA: Diagnosis not present

## 2017-07-25 DIAGNOSIS — N186 End stage renal disease: Secondary | ICD-10-CM | POA: Diagnosis not present

## 2017-07-25 DIAGNOSIS — Z23 Encounter for immunization: Secondary | ICD-10-CM | POA: Diagnosis not present

## 2017-07-28 DIAGNOSIS — N2581 Secondary hyperparathyroidism of renal origin: Secondary | ICD-10-CM | POA: Diagnosis not present

## 2017-07-28 DIAGNOSIS — Z23 Encounter for immunization: Secondary | ICD-10-CM | POA: Diagnosis not present

## 2017-07-28 DIAGNOSIS — N186 End stage renal disease: Secondary | ICD-10-CM | POA: Diagnosis not present

## 2017-07-28 DIAGNOSIS — D631 Anemia in chronic kidney disease: Secondary | ICD-10-CM | POA: Diagnosis not present

## 2017-07-30 DIAGNOSIS — N186 End stage renal disease: Secondary | ICD-10-CM | POA: Diagnosis not present

## 2017-07-30 DIAGNOSIS — Z23 Encounter for immunization: Secondary | ICD-10-CM | POA: Diagnosis not present

## 2017-07-30 DIAGNOSIS — D631 Anemia in chronic kidney disease: Secondary | ICD-10-CM | POA: Diagnosis not present

## 2017-07-30 DIAGNOSIS — N2581 Secondary hyperparathyroidism of renal origin: Secondary | ICD-10-CM | POA: Diagnosis not present

## 2017-08-01 DIAGNOSIS — Z23 Encounter for immunization: Secondary | ICD-10-CM | POA: Diagnosis not present

## 2017-08-01 DIAGNOSIS — D631 Anemia in chronic kidney disease: Secondary | ICD-10-CM | POA: Diagnosis not present

## 2017-08-01 DIAGNOSIS — N186 End stage renal disease: Secondary | ICD-10-CM | POA: Diagnosis not present

## 2017-08-01 DIAGNOSIS — N2581 Secondary hyperparathyroidism of renal origin: Secondary | ICD-10-CM | POA: Diagnosis not present

## 2017-08-04 DIAGNOSIS — Z23 Encounter for immunization: Secondary | ICD-10-CM | POA: Diagnosis not present

## 2017-08-04 DIAGNOSIS — N186 End stage renal disease: Secondary | ICD-10-CM | POA: Diagnosis not present

## 2017-08-04 DIAGNOSIS — D631 Anemia in chronic kidney disease: Secondary | ICD-10-CM | POA: Diagnosis not present

## 2017-08-04 DIAGNOSIS — N2581 Secondary hyperparathyroidism of renal origin: Secondary | ICD-10-CM | POA: Diagnosis not present

## 2017-08-06 DIAGNOSIS — D631 Anemia in chronic kidney disease: Secondary | ICD-10-CM | POA: Diagnosis not present

## 2017-08-06 DIAGNOSIS — N186 End stage renal disease: Secondary | ICD-10-CM | POA: Diagnosis not present

## 2017-08-06 DIAGNOSIS — N2581 Secondary hyperparathyroidism of renal origin: Secondary | ICD-10-CM | POA: Diagnosis not present

## 2017-08-06 DIAGNOSIS — Z23 Encounter for immunization: Secondary | ICD-10-CM | POA: Diagnosis not present

## 2017-08-08 DIAGNOSIS — N186 End stage renal disease: Secondary | ICD-10-CM | POA: Diagnosis not present

## 2017-08-08 DIAGNOSIS — Z23 Encounter for immunization: Secondary | ICD-10-CM | POA: Diagnosis not present

## 2017-08-08 DIAGNOSIS — N2581 Secondary hyperparathyroidism of renal origin: Secondary | ICD-10-CM | POA: Diagnosis not present

## 2017-08-08 DIAGNOSIS — D631 Anemia in chronic kidney disease: Secondary | ICD-10-CM | POA: Diagnosis not present

## 2017-08-11 DIAGNOSIS — N186 End stage renal disease: Secondary | ICD-10-CM | POA: Diagnosis not present

## 2017-08-11 DIAGNOSIS — N2581 Secondary hyperparathyroidism of renal origin: Secondary | ICD-10-CM | POA: Diagnosis not present

## 2017-08-11 DIAGNOSIS — D631 Anemia in chronic kidney disease: Secondary | ICD-10-CM | POA: Diagnosis not present

## 2017-08-11 DIAGNOSIS — Z23 Encounter for immunization: Secondary | ICD-10-CM | POA: Diagnosis not present

## 2017-08-13 DIAGNOSIS — N186 End stage renal disease: Secondary | ICD-10-CM | POA: Diagnosis not present

## 2017-08-13 DIAGNOSIS — N2581 Secondary hyperparathyroidism of renal origin: Secondary | ICD-10-CM | POA: Diagnosis not present

## 2017-08-13 DIAGNOSIS — D631 Anemia in chronic kidney disease: Secondary | ICD-10-CM | POA: Diagnosis not present

## 2017-08-13 DIAGNOSIS — Z23 Encounter for immunization: Secondary | ICD-10-CM | POA: Diagnosis not present

## 2017-08-15 DIAGNOSIS — D631 Anemia in chronic kidney disease: Secondary | ICD-10-CM | POA: Diagnosis not present

## 2017-08-15 DIAGNOSIS — N2581 Secondary hyperparathyroidism of renal origin: Secondary | ICD-10-CM | POA: Diagnosis not present

## 2017-08-15 DIAGNOSIS — N186 End stage renal disease: Secondary | ICD-10-CM | POA: Diagnosis not present

## 2017-08-15 DIAGNOSIS — Z23 Encounter for immunization: Secondary | ICD-10-CM | POA: Diagnosis not present

## 2017-08-18 DIAGNOSIS — Z23 Encounter for immunization: Secondary | ICD-10-CM | POA: Diagnosis not present

## 2017-08-18 DIAGNOSIS — N186 End stage renal disease: Secondary | ICD-10-CM | POA: Diagnosis not present

## 2017-08-18 DIAGNOSIS — N2581 Secondary hyperparathyroidism of renal origin: Secondary | ICD-10-CM | POA: Diagnosis not present

## 2017-08-18 DIAGNOSIS — D631 Anemia in chronic kidney disease: Secondary | ICD-10-CM | POA: Diagnosis not present

## 2017-08-20 DIAGNOSIS — N186 End stage renal disease: Secondary | ICD-10-CM | POA: Diagnosis not present

## 2017-08-20 DIAGNOSIS — Z23 Encounter for immunization: Secondary | ICD-10-CM | POA: Diagnosis not present

## 2017-08-20 DIAGNOSIS — D631 Anemia in chronic kidney disease: Secondary | ICD-10-CM | POA: Diagnosis not present

## 2017-08-20 DIAGNOSIS — N2581 Secondary hyperparathyroidism of renal origin: Secondary | ICD-10-CM | POA: Diagnosis not present

## 2017-08-22 DIAGNOSIS — D631 Anemia in chronic kidney disease: Secondary | ICD-10-CM | POA: Diagnosis not present

## 2017-08-22 DIAGNOSIS — N186 End stage renal disease: Secondary | ICD-10-CM | POA: Diagnosis not present

## 2017-08-22 DIAGNOSIS — Z992 Dependence on renal dialysis: Secondary | ICD-10-CM | POA: Diagnosis not present

## 2017-08-22 DIAGNOSIS — N2581 Secondary hyperparathyroidism of renal origin: Secondary | ICD-10-CM | POA: Diagnosis not present

## 2017-08-22 DIAGNOSIS — Z23 Encounter for immunization: Secondary | ICD-10-CM | POA: Diagnosis not present

## 2017-08-22 DIAGNOSIS — T8610 Unspecified complication of kidney transplant: Secondary | ICD-10-CM | POA: Diagnosis not present

## 2017-08-25 DIAGNOSIS — N2581 Secondary hyperparathyroidism of renal origin: Secondary | ICD-10-CM | POA: Diagnosis not present

## 2017-08-25 DIAGNOSIS — N186 End stage renal disease: Secondary | ICD-10-CM | POA: Diagnosis not present

## 2017-08-25 DIAGNOSIS — Z23 Encounter for immunization: Secondary | ICD-10-CM | POA: Diagnosis not present

## 2017-08-25 DIAGNOSIS — D631 Anemia in chronic kidney disease: Secondary | ICD-10-CM | POA: Diagnosis not present

## 2017-08-27 ENCOUNTER — Ambulatory Visit (HOSPITAL_COMMUNITY)
Admission: EM | Admit: 2017-08-27 | Discharge: 2017-08-27 | Disposition: A | Discharge status: Home / Self Care | Payer: Medicare Other | Attending: Family Medicine | Admitting: Family Medicine

## 2017-08-27 ENCOUNTER — Encounter (HOSPITAL_COMMUNITY): Payer: Self-pay | Admitting: Emergency Medicine

## 2017-08-27 ENCOUNTER — Ambulatory Visit (INDEPENDENT_AMBULATORY_CARE_PROVIDER_SITE_OTHER): Payer: Medicare Other

## 2017-08-27 DIAGNOSIS — Z23 Encounter for immunization: Secondary | ICD-10-CM | POA: Diagnosis not present

## 2017-08-27 DIAGNOSIS — J40 Bronchitis, not specified as acute or chronic: Secondary | ICD-10-CM

## 2017-08-27 DIAGNOSIS — N186 End stage renal disease: Secondary | ICD-10-CM | POA: Diagnosis not present

## 2017-08-27 DIAGNOSIS — R05 Cough: Secondary | ICD-10-CM | POA: Diagnosis not present

## 2017-08-27 DIAGNOSIS — D631 Anemia in chronic kidney disease: Secondary | ICD-10-CM | POA: Diagnosis not present

## 2017-08-27 DIAGNOSIS — N2581 Secondary hyperparathyroidism of renal origin: Secondary | ICD-10-CM | POA: Diagnosis not present

## 2017-08-27 MED ORDER — AZITHROMYCIN 250 MG PO TABS: 250.0000 mg | ORAL_TABLET | Freq: Every day | ORAL | 0 refills | Status: DC

## 2017-08-27 MED ORDER — BENZONATATE 100 MG PO CAPS: 100.0000 mg | ORAL_CAPSULE | Freq: Three times a day (TID) | ORAL | 0 refills | Status: DC

## 2017-08-27 NOTE — ED Triage Notes (Signed)
PT reports URI symptoms a month ago. Cough has lingered. PT does dialysis MWF. She had a full treatment today.

## 2017-08-27 NOTE — ED Provider Notes (Signed)
[facility]    CSN: [id number] Arrival date & time: 08/27/17  1843     History   Chief Complaint Chief Complaint  Patient presents with  . Cough    HPI Kerry Cook is a 32 y.o. female.   32 year old female, with history of hypertension, end-stage renal disease on dialysis Monday Wednesday and Friday, presenting today due to cough.  Patient states that about a month ago, she had cold symptoms.  States that all of her symptoms have since resolved and she now has had a nonproductive cough over the past month.  She denies any chest pain or shortness of breath.  She had no fever or chills.   The history is provided by the patient.  Cough  Cough characteristics:  Non-productive Sputum characteristics:  Nondescript Severity:  Moderate Onset quality:  Gradual Duration:  4 weeks Timing:  Intermittent Progression:  Unchanged Chronicity:  New Smoker: no   Context: not animal exposure, not exposure to allergens, not fumes, not occupational exposure, not sick contacts, not smoke exposure, not upper respiratory infection, not weather changes and not with activity   Relieved by:  Nothing Worsened by:  Nothing Ineffective treatments:  None tried Associated symptoms: no chest pain, no chills, no diaphoresis, no ear fullness, no ear pain, no eye discharge, no fever, no headaches, no myalgias, no rash, no rhinorrhea, no shortness of breath, no sinus congestion, no sore throat, no weight loss and no wheezing   Risk factors: no chemical exposure, no recent infection and no recent travel     Past Medical History:  Diagnosis Date  . Chronic kidney disease   . Dialysis patient ([place])   . History of kidney transplant 2012   kidney failure due to hypertension  . Hypertension   . Multiple gastric ulcers   . Pneumonia 01/23/2015    Patient Active Problem List   Diagnosis Date Noted  . Cellulitis 04/22/2017  . Cellulitis of left upper extremity 04/22/2017  . Generalized  abdominal pain   . Medicare annual wellness visit, initial 10/31/2015  . Cephalalgia   . Headache 06/07/2015  . Hypertension   . History of kidney transplant   . Anemia in chronic kidney disease 10/01/2007  . End stage renal disease on dialysis ([place]) 10/01/2007  . PAP SMEAR, LGSIL, ABNORMAL 10/01/2007  . PROTEINURIA 08/21/2006    Past Surgical History:  Procedure Laterality Date  . ESOPHAGOGASTRODUODENOSCOPY N/A 04/01/2016   Procedure: ESOPHAGOGASTRODUODENOSCOPY (EGD);  Surgeon: Gatha Mayer, MD;  Location[facility] ENDOSCOPY;  Service: Endoscopy;  Laterality: N/A;  . ESOPHAGOGASTRODUODENOSCOPY (EGD) WITH PROPOFOL N/A 11/20/2016   Procedure: ESOPHAGOGASTRODUODENOSCOPY (EGD) WITH PROPOFOL;  Surgeon: Doran Stabler, MD;  Location: [facility];  Service: Endoscopy;  Laterality: N/A;  . KIDNEY TRANSPLANT Bilateral 2012    OB History    Gravida Para Term Preterm AB Living   1       1 0   SAB TAB Ectopic Multiple Live Births   1               Home Medications    Prior to Admission medications   Medication Sig Start Date End Date Taking? Authorizing Provider  acetaminophen (TYLENOL) 325 MG tablet Take 650 mg by mouth every 6 (six) hours as needed for headache (pain).    [provider]  amLODipine (NORVASC) 10 MG tablet Take 1 tablet (10 mg total) by mouth every evening. Patient taking differently: Take 10 mg by mouth daily.  09/13/15  [name], MD  azithromycin (ZITHROMAX) 250 MG tablet Take 1 tablet (250 mg total) by mouth daily. Take first 2 tablets together, then 1 every day until finished. 08/27/17   Blue, Olivia C, PA-C  benzonatate (TESSALON) 100 MG capsule Take 1 capsule (100 mg total) by mouth every 8 (eight) hours. 08/27/17   Blue, Olivia C, PA-C  calcium acetate, Phos Binder, (PHOSLYRA) 667 MG/5ML SOLN Take 1,334 mg by mouth 3 (three) times daily with meals.    [provider]  cloNIDine (CATAPRES) 0.1 MG tablet Take 0.1 mg by mouth 2 (two) times  daily.    [provider]  doxycycline (VIBRAMYCIN) 100 MG capsule Take 1 capsule (100 mg total) by mouth 2 (two) times daily. 04/23/17   Mikhail, Velta Addison, DO  labetalol (NORMODYNE) 200 MG tablet Take 1 tablet (200 mg total) by mouth 2 (two) times daily. 04/02/16   Elgergawy, Silver Huguenin, MD  losartan (COZAAR) 100 MG tablet Take 1 tablet (100 mg total) by mouth every evening. 05/21/16   Verlee Monte, MD  multivitamin (RENA-VIT) TABS tablet Take 1 tablet by mouth at bedtime. 06/09/15   Debbe Odea, MD  ondansetron (ZOFRAN ODT) 8 MG disintegrating tablet Take 1 tablet (8 mg total) by mouth every 8 (eight) hours as needed for nausea or vomiting. 03/24/16   Jola Schmidt, MD  Simethicone (GAS-X PO) Take 1 tablet by mouth daily as needed (flatulence/bloatin).    [provider]  medroxyPROGESTERone (DEPO-PROVERA) 150 MG/ML injection Inject 1 mL (150 mg total) into the muscle every 3 (three) months. Patient not taking: Reported on 03/21/2015 12/06/14 06/06/15  Shelly Bombard, MD    Family History Family History  Problem Relation Age of Onset  . Kidney disease Father   . Lupus Maternal Grandmother   . Cancer Maternal Grandfather   . Colon cancer Neg Hx   . Stomach cancer Neg Hx   . Rectal cancer Neg Hx   . Esophageal cancer Neg Hx   . Liver cancer Neg Hx     Social History Social History   Tobacco Use  . Smoking status: Never Smoker  . Smokeless tobacco: Never Used  Substance Use Topics  . Alcohol use: No    Alcohol/week: 0.0 oz  . Drug use: No     Allergies   Zosyn [piperacillin sod-tazobactam so]   Review of Systems Review of Systems  Constitutional: Negative for chills, diaphoresis, fever and weight loss.  HENT: Negative for ear pain, rhinorrhea and sore throat.   Eyes: Negative for pain, discharge and visual disturbance.  Respiratory: Positive for cough. Negative for shortness of breath and wheezing.   Cardiovascular: Negative for chest pain and  palpitations.  Gastrointestinal: Negative for abdominal pain and vomiting.  Genitourinary: Negative for dysuria and hematuria.  Musculoskeletal: Negative for arthralgias, back pain and myalgias.  Skin: Negative for color change and rash.  Neurological: Negative for seizures, syncope and headaches.  All other systems reviewed and are negative.    Physical Exam Triage Vital Signs ED Triage Vitals [08/27/17 1906]  Enc Vitals Group     BP (!) 141/93     Pulse Rate 73     Resp 16     Temp 98.2 F (36.8 C)     Temp Source Oral     SpO2 100 %     Weight 130 lb (59 kg)     Height 5' (1.524 m)     Head Circumference      Peak Flow  Pain Score 0     Pain Loc      Pain Edu?      Excl. in [place]?    No data found.  Updated Vital Signs BP (!) 141/93   Pulse 73   Temp 98.2 F (36.8 C) (Oral)   Resp 16   Ht 5' (1.524 m)   Wt 130 lb (59 kg)   LMP 08/27/2017 (Exact Date)   SpO2 100%   BMI 25.39 kg/m   Visual Acuity Right Eye Distance:   Left Eye Distance:   Bilateral Distance:    Right Eye Near:   Left Eye Near:    Bilateral Near:     Physical Exam  Constitutional: She appears well-developed and well-nourished. No distress.  HENT:  Head: Normocephalic and atraumatic.  Eyes: Conjunctivae are normal.  Neck: Neck supple.  Cardiovascular: Normal rate and regular rhythm.  No murmur heard. Pulmonary/Chest: Effort normal and breath sounds normal. No stridor. No respiratory distress. She has no decreased breath sounds. She has no wheezes. She has no rhonchi. She has no rales.  Abdominal: Soft. There is no tenderness.  Musculoskeletal: She exhibits no edema.  Neurological: She is alert.  Skin: Skin is warm and dry.  Psychiatric: She has a normal mood and affect.  Nursing note and vitals reviewed.    UC Treatments / Results  Labs (all labs ordered are listed, but only abnormal results are displayed) Labs Reviewed - No data to display  EKG  EKG Interpretation None        Radiology Dg Chest 2 View  Result Date: 08/27/2017 CLINICAL DATA:  Cough for several weeks EXAM: CHEST - 2 VIEW COMPARISON:  11/08/2016 FINDINGS: The heart size and mediastinal contours are within normal limits. Both lungs are clear. The visualized skeletal structures are unremarkable. IMPRESSION: No active cardiopulmonary disease. Electronically Signed   By: Inez Catalina M.D.   On: 08/27/2017 19:33    Procedures Procedures (including critical care time)  Medications Ordered in UC Medications - No data to display   Initial Impression / Assessment and Plan / UC Course  I have reviewed the triage vital signs and the nursing notes.  Pertinent labs & imaging results that were available during my care of the patient were reviewed by me and considered in my medical decision making (see chart for details).     Normal CXR Due to duration of symptoms, > 10 days, will treat with antibiotics    Final Clinical Impressions(s) / UC Diagnoses   Final diagnoses:  Bronchitis    ED Discharge Orders        Ordered    benzonatate (TESSALON) 100 MG capsule  Every 8 hours     08/27/17 1939    azithromycin (ZITHROMAX) 250 MG tablet  Daily     08/27/17 1941       Controlled Substance Prescriptions [place] Controlled Substance Registry consulted? Not Applicable   Phebe Colla, [place] 08/27/17 1954

## 2017-08-29 DIAGNOSIS — D631 Anemia in chronic kidney disease: Secondary | ICD-10-CM | POA: Diagnosis not present

## 2017-08-29 DIAGNOSIS — N2581 Secondary hyperparathyroidism of renal origin: Secondary | ICD-10-CM | POA: Diagnosis not present

## 2017-08-29 DIAGNOSIS — N186 End stage renal disease: Secondary | ICD-10-CM | POA: Diagnosis not present

## 2017-08-29 DIAGNOSIS — Z23 Encounter for immunization: Secondary | ICD-10-CM | POA: Diagnosis not present

## 2017-09-01 DIAGNOSIS — N2581 Secondary hyperparathyroidism of renal origin: Secondary | ICD-10-CM | POA: Diagnosis not present

## 2017-09-01 DIAGNOSIS — Z23 Encounter for immunization: Secondary | ICD-10-CM | POA: Diagnosis not present

## 2017-09-01 DIAGNOSIS — N186 End stage renal disease: Secondary | ICD-10-CM | POA: Diagnosis not present

## 2017-09-01 DIAGNOSIS — D631 Anemia in chronic kidney disease: Secondary | ICD-10-CM | POA: Diagnosis not present

## 2017-09-03 DIAGNOSIS — D631 Anemia in chronic kidney disease: Secondary | ICD-10-CM | POA: Diagnosis not present

## 2017-09-03 DIAGNOSIS — Z23 Encounter for immunization: Secondary | ICD-10-CM | POA: Diagnosis not present

## 2017-09-03 DIAGNOSIS — N186 End stage renal disease: Secondary | ICD-10-CM | POA: Diagnosis not present

## 2017-09-03 DIAGNOSIS — N2581 Secondary hyperparathyroidism of renal origin: Secondary | ICD-10-CM | POA: Diagnosis not present

## 2017-09-05 DIAGNOSIS — N186 End stage renal disease: Secondary | ICD-10-CM | POA: Diagnosis not present

## 2017-09-05 DIAGNOSIS — N2581 Secondary hyperparathyroidism of renal origin: Secondary | ICD-10-CM | POA: Diagnosis not present

## 2017-09-05 DIAGNOSIS — D631 Anemia in chronic kidney disease: Secondary | ICD-10-CM | POA: Diagnosis not present

## 2017-09-05 DIAGNOSIS — Z23 Encounter for immunization: Secondary | ICD-10-CM | POA: Diagnosis not present

## 2017-09-08 ENCOUNTER — Emergency Department (HOSPITAL_COMMUNITY): Payer: Medicare Other

## 2017-09-08 ENCOUNTER — Other Ambulatory Visit: Payer: Self-pay

## 2017-09-08 ENCOUNTER — Emergency Department (HOSPITAL_COMMUNITY)
Admission: EM | Admit: 2017-09-08 | Discharge: 2017-09-08 | Disposition: A | Discharge status: Home / Self Care | Payer: Medicare Other | Attending: Emergency Medicine | Admitting: Emergency Medicine

## 2017-09-08 ENCOUNTER — Encounter (HOSPITAL_COMMUNITY): Payer: Self-pay

## 2017-09-08 DIAGNOSIS — J069 Acute upper respiratory infection, unspecified: Secondary | ICD-10-CM | POA: Insufficient documentation

## 2017-09-08 DIAGNOSIS — D631 Anemia in chronic kidney disease: Secondary | ICD-10-CM | POA: Diagnosis not present

## 2017-09-08 DIAGNOSIS — N186 End stage renal disease: Secondary | ICD-10-CM | POA: Insufficient documentation

## 2017-09-08 DIAGNOSIS — Z23 Encounter for immunization: Secondary | ICD-10-CM | POA: Diagnosis not present

## 2017-09-08 DIAGNOSIS — R0981 Nasal congestion: Secondary | ICD-10-CM | POA: Diagnosis present

## 2017-09-08 DIAGNOSIS — R509 Fever, unspecified: Secondary | ICD-10-CM | POA: Diagnosis not present

## 2017-09-08 DIAGNOSIS — Z79899 Other long term (current) drug therapy: Secondary | ICD-10-CM | POA: Diagnosis not present

## 2017-09-08 DIAGNOSIS — I120 Hypertensive chronic kidney disease with stage 5 chronic kidney disease or end stage renal disease: Secondary | ICD-10-CM | POA: Insufficient documentation

## 2017-09-08 DIAGNOSIS — N2581 Secondary hyperparathyroidism of renal origin: Secondary | ICD-10-CM | POA: Diagnosis not present

## 2017-09-08 DIAGNOSIS — J029 Acute pharyngitis, unspecified: Secondary | ICD-10-CM | POA: Diagnosis not present

## 2017-09-08 DIAGNOSIS — R05 Cough: Secondary | ICD-10-CM | POA: Diagnosis not present

## 2017-09-08 LAB — BASIC METABOLIC PANEL
Anion gap: 15 (ref 5–15)
BUN: 13 mg/dL (ref 6–20)
CO2: 29 mmol/L (ref 22–32)
Calcium: 9 mg/dL (ref 8.9–10.3)
Chloride: 92 mmol/L — ABNORMAL LOW (ref 101–111)
Creatinine, Ser: 7.3 mg/dL — ABNORMAL HIGH (ref 0.44–1.00)
GFR calc Af Amer: 8 mL/min — ABNORMAL LOW (ref >60)
GFR calc non Af Amer: 7 mL/min — ABNORMAL LOW (ref >60)
Glucose, Bld: 153 mg/dL — ABNORMAL HIGH (ref 65–99)
Potassium: 3.8 mmol/L (ref 3.5–5.1)
Sodium: 136 mmol/L (ref 135–145)

## 2017-09-08 LAB — CBC
HCT: 35 % — ABNORMAL LOW (ref 36.0–46.0)
Hemoglobin: 11.3 g/dL — ABNORMAL LOW (ref 12.0–15.0)
MCH: 31.4 pg (ref 26.0–34.0)
MCHC: 32.3 g/dL (ref 30.0–36.0)
MCV: 97.2 fL (ref 78.0–100.0)
Platelets: 160 10*3/uL (ref 150–400)
RBC: 3.6 MIL/uL — ABNORMAL LOW (ref 3.87–5.11)
RDW: 21 % — ABNORMAL HIGH (ref 11.5–15.5)
WBC: 8.2 10*3/uL (ref 4.0–10.5)

## 2017-09-08 LAB — I-STAT BETA HCG BLOOD, ED (MC, WL, AP ONLY): I-stat hCG, quantitative: <5 m[IU]/mL (ref <5)

## 2017-09-08 LAB — RAPID STREP SCREEN (MED CTR MEBANE ONLY): Streptococcus, Group A Screen (Direct): NEGATIVE

## 2017-09-08 MED ORDER — DOXYCYCLINE HYCLATE 100 MG PO CAPS: 100.0000 mg | ORAL_CAPSULE | Freq: Two times a day (BID) | ORAL | 0 refills | Status: DC

## 2017-09-08 MED ORDER — ACETAMINOPHEN 325 MG PO TABS
650.0000 mg | ORAL_TABLET | Freq: Once | ORAL | Status: AC | PRN
  Administered 2017-09-08: 650 mg via ORAL
  Filled 2017-09-08: qty 2

## 2017-09-08 NOTE — ED Provider Notes (Signed)
[facility] EMERGENCY DEPARTMENT Provider Note   CSN: [id number] Arrival date & time: 09/08/17  1503     History   Chief Complaint Chief Complaint  Patient presents with  . URI    HPI Kerry Cook is a 32 y.o. female.  The history is provided by the patient and medical records.  URI   Associated symptoms include congestion, sore throat and cough. Pertinent negatives include no neck pain.   Kerry Cook is a 32 y.o. female  with a PMH of CKD on dialysis MWF (completed full treatment today), HTN, anemia who presents to the Emergency Department complaining of persistent productive cough, congestion for the last month.  Last week, she developed a sore throat.  She reports low-grade fevers over the last 3 days, never higher than 101.  She was seen by urgent care on 3/06 (12 days ago) for similar where she was started on azithromycin.  She took this medication to completion without any improvement.  Again, she was not having fevers at this time as the started 3 days ago.  She denies any neck pain or stiffness.  No shortness of breath, chest pain, abdominal pain, nausea, vomiting or diarrhea.  She has also taken Tessalon with little improvement in her cough.    Past Medical History:  Diagnosis Date  . Chronic kidney disease   . Dialysis patient ([place])   . History of kidney transplant 2012   kidney failure due to hypertension  . Hypertension   . Multiple gastric ulcers   . Pneumonia 01/23/2015    Patient Active Problem List   Diagnosis Date Noted  . Cellulitis 04/22/2017  . Cellulitis of left upper extremity 04/22/2017  . Generalized abdominal pain   . Medicare annual wellness visit, initial 10/31/2015  . Cephalalgia   . Headache 06/07/2015  . Hypertension   . History of kidney transplant   . Anemia in chronic kidney disease 10/01/2007  . End stage renal disease on dialysis ([place]) 10/01/2007  . PAP SMEAR, LGSIL, ABNORMAL 10/01/2007  . PROTEINURIA  08/21/2006    Past Surgical History:  Procedure Laterality Date  . ESOPHAGOGASTRODUODENOSCOPY N/A 04/01/2016   Procedure: ESOPHAGOGASTRODUODENOSCOPY (EGD);  Surgeon: Gatha Mayer, MD;  [facility];  Service: Endoscopy;  Laterality: N/A;  . ESOPHAGOGASTRODUODENOSCOPY (EGD) WITH PROPOFOL N/A 11/20/2016   Procedure: ESOPHAGOGASTRODUODENOSCOPY (EGD) WITH PROPOFOL;  Surgeon: Doran Stabler, MD;  Location: [facility];  Service: Endoscopy;  Laterality: N/A;  . KIDNEY TRANSPLANT Bilateral 2012    OB History    Gravida Para Term Preterm AB Living   1       1 0   SAB TAB Ectopic Multiple Live Births   1               Home Medications    Prior to Admission medications   Medication Sig Start Date End Date Taking? Authorizing Provider  acetaminophen (TYLENOL) 325 MG tablet Take 650 mg by mouth every 6 (six) hours as needed for headache (pain).    [provider]  amLODipine (NORVASC) 10 MG tablet Take 1 tablet (10 mg total) by mouth every evening. Patient taking differently: Take 10 mg by mouth daily.  09/13/15   [name], MD  azithromycin (ZITHROMAX) 250 MG tablet Take 1 tablet (250 mg total) by mouth daily. Take first 2 tablets together, then 1 every day until finished. 08/27/17   Blue, Olivia C, PA-C  benzonatate (TESSALON) 100 MG capsule Take 1 capsule (  100 mg total) by mouth every 8 (eight) hours. 08/27/17   Blue, Olivia C, PA-C  calcium acetate, Phos Binder, (PHOSLYRA) 667 MG/5ML SOLN Take 1,334 mg by mouth 3 (three) times daily with meals.    [provider]  cloNIDine (CATAPRES) 0.1 MG tablet Take 0.1 mg by mouth 2 (two) times daily.    [provider]  doxycycline (VIBRAMYCIN) 100 MG capsule Take 1 capsule (100 mg total) by mouth 2 (two) times daily. 09/08/17   Ward, Ozella Almond, PA-C  labetalol (NORMODYNE) 200 MG tablet Take 1 tablet (200 mg total) by mouth 2 (two) times daily. 04/02/16   Elgergawy, Silver Huguenin, MD  losartan (COZAAR) 100  MG tablet Take 1 tablet (100 mg total) by mouth every evening. 05/21/16   Verlee Monte, MD  multivitamin (RENA-VIT) TABS tablet Take 1 tablet by mouth at bedtime. 06/09/15   Debbe Odea, MD  ondansetron (ZOFRAN ODT) 8 MG disintegrating tablet Take 1 tablet (8 mg total) by mouth every 8 (eight) hours as needed for nausea or vomiting. 03/24/16   Jola Schmidt, MD  Simethicone (GAS-X PO) Take 1 tablet by mouth daily as needed (flatulence/bloatin).    [provider]    Family History Family History  Problem Relation Age of Onset  . Kidney disease Father   . Lupus Maternal Grandmother   . Cancer Maternal Grandfather   . Colon cancer Neg Hx   . Stomach cancer Neg Hx   . Rectal cancer Neg Hx   . Esophageal cancer Neg Hx   . Liver cancer Neg Hx     Social History Social History   Tobacco Use  . Smoking status: Never Smoker  . Smokeless tobacco: Never Used  Substance Use Topics  . Alcohol use: No    Alcohol/week: 0.0 oz  . Drug use: No     Allergies   Zosyn [piperacillin sod-tazobactam so]   Review of Systems Review of Systems  Constitutional: Positive for fever. Negative for chills.  HENT: Positive for congestion and sore throat.   Respiratory: Positive for cough. Negative for shortness of breath.   Musculoskeletal: Negative for neck pain.  All other systems reviewed and are negative.    Physical Exam Updated Vital Signs BP (!) 148/87   Pulse 94   Temp 100 F (37.8 C) (Oral)   Resp 18   Ht 5' (1.524 m)   Wt 60.1 kg (132 lb 7.9 oz)   LMP 08/08/2017 (Exact Date)   SpO2 100%   Breastfeeding? No   BMI 25.88 kg/m   Physical Exam  Constitutional: She is oriented to person, place, and time. She appears well-developed and well-nourished. No distress.  HENT:  Head: Normocephalic and atraumatic.  OP with erythema, no exudates or tonsillar hypertrophy. + nasal congestion with mucosal edema.   Neck: Normal range of motion. Neck supple.  No meningeal signs.     Cardiovascular: Normal rate, regular rhythm and normal heart sounds.  Pulmonary/Chest: Effort normal.  Lungs are clear to auscultation bilaterally - no w/r/r  Abdominal: Soft. She exhibits no distension. There is no tenderness.  Musculoskeletal: Normal range of motion.  Neurological: She is alert and oriented to person, place, and time.  Skin: Skin is warm and dry. She is not diaphoretic.  Nursing note and vitals reviewed.    ED Treatments / Results  Labs (all labs ordered are listed, but only abnormal results are displayed) Labs Reviewed  CBC - Abnormal; Notable for the following components:  Result Value   RBC 3.60 (*)    Hemoglobin 11.3 (*)    HCT 35.0 (*)    RDW 21.0 (*)    All other components within normal limits  BASIC METABOLIC PANEL - Abnormal; Notable for the following components:   Chloride 92 (*)    Glucose, Bld 153 (*)    Creatinine, Ser 7.30 (*)    GFR calc non Af Amer 7 (*)    GFR calc Af Amer 8 (*)    All other components within normal limits  RAPID STREP SCREEN (NOT AT [facility])  CULTURE, GROUP A STREP ([place])  I-STAT BETA HCG BLOOD, ED ([facility], [facility], [facility] ONLY)    EKG  EKG Interpretation None       Radiology Dg Chest 2 View  Result Date: 09/08/2017 CLINICAL DATA:  Cough EXAM: CHEST - 2 VIEW COMPARISON:  08/27/2017 FINDINGS: No focal pulmonary infiltrate or effusion. Borderline heart size. No pneumothorax. IMPRESSION: No active cardiopulmonary disease. Electronically Signed   By: Donavan Foil M.D.   On: 09/08/2017 16:45    Procedures Procedures (including critical care time)  Medications Ordered in ED Medications  acetaminophen (TYLENOL) tablet 650 mg (650 mg Oral Given 09/08/17 1616)     Initial Impression / Assessment and Plan / ED Course  I have reviewed the triage vital signs and the nursing notes.  Pertinent labs & imaging results that were available during my care of the patient were reviewed by me and considered in my medical decision making  (see chart for details).    Kerry Cook is a 32 y.o. female who presents to ED for persistent productive cough x 1 month, sore throat x 1 week. Started having fevers 3 days ago. On exam, patient is non-toxic appearing with clear lung exam. OP with erythema, but no exudates or tonsillar hypertrophy. No meningeal signs. Labs reviewed and baseline. Strep negative. CXR with no acute findings. Will treat with doxycyline and have patient follow up with PCP. Reasons to return to ER discussed and all questions answered.  Patient discussed with Dr. Wilson Singer who agrees with treatment plan.    Final Clinical Impressions(s) / ED Diagnoses   Final diagnoses:  Upper respiratory tract infection, unspecified type    ED Discharge Orders        Ordered    doxycycline (VIBRAMYCIN) 100 MG capsule  2 times daily     09/08/17 1818       Ward, Ozella Almond, PA-C 09/08/17 1849    Virgel Manifold, MD 09/09/17 1502

## 2017-09-08 NOTE — Discharge Instructions (Signed)
It was my pleasure taking care of you today!   Please take all of your antibiotics until finished!  Please follow up with your primary doctor. If you do not have one, please see the information below.   Return to ER for new or worsening symptoms, any additional concerns.   To find a primary care or specialty doctor please call [phone] or [phone] to access "[facility] Doctor Service."  You may also go on the [facility] website at [email or web address]  There are also multiple [facility] practices throughout the [place] that are frequently accepting new patients. You may find a clinic that is close to your home and contact them.  [place] [address] [phone]  [facility] in [place] (also locations in [place] and [place]) - [place] [phone]  [place] [phone]

## 2017-09-08 NOTE — ED Provider Notes (Signed)
Patient placed in Quick Look pathway, seen and evaluated   Chief Complaint: flu like symptoms  HPI:   Persistent "symptoms of the flu" including cough, sputum, nasal congestion, chills, generalized malaise x 1 month. Went to urgent care who gave her antibiotics without relief two weeks ago. H/o of ESRD had full dialysis session today without complications.   ROS: No fevers, nausea, vomiting, abdominal pain, flank pain, changes in BM. Anuric.   Physical Exam:   Gen: No distress  Neuro: Awake and Alert  Skin: Warm    Focused Exam: Borderline tachycardic and febrile. MMM. Diminished lung sounds to lower lobes anteriorly, no wheezing, rales. Abdomen NTND. Mildly edematous nasal membranes and erythematous oropharynx. No exudates.    Initiation of care has begun. The patient has been counseled on the process, plan, and necessity for staying for the completion/evaluation, and the remainder of the medical screening examination.    Kinnie Feil, PA-C 09/08/17 1654    Noemi Chapel, MD 09/10/17 [phone]

## 2017-09-08 NOTE — ED Triage Notes (Signed)
Pt endorses productive cough with yellow mucous, runny nose, chills x 1 months. Pt had full dialysis treatment today. 100.3 oral temp. HR 102.

## 2017-09-10 DIAGNOSIS — N2581 Secondary hyperparathyroidism of renal origin: Secondary | ICD-10-CM | POA: Diagnosis not present

## 2017-09-10 DIAGNOSIS — N186 End stage renal disease: Secondary | ICD-10-CM | POA: Diagnosis not present

## 2017-09-10 DIAGNOSIS — Z23 Encounter for immunization: Secondary | ICD-10-CM | POA: Diagnosis not present

## 2017-09-10 DIAGNOSIS — D631 Anemia in chronic kidney disease: Secondary | ICD-10-CM | POA: Diagnosis not present

## 2017-09-10 LAB — CULTURE, GROUP A STREP (THRC)

## 2017-09-12 DIAGNOSIS — D631 Anemia in chronic kidney disease: Secondary | ICD-10-CM | POA: Diagnosis not present

## 2017-09-12 DIAGNOSIS — N2581 Secondary hyperparathyroidism of renal origin: Secondary | ICD-10-CM | POA: Diagnosis not present

## 2017-09-12 DIAGNOSIS — Z23 Encounter for immunization: Secondary | ICD-10-CM | POA: Diagnosis not present

## 2017-09-12 DIAGNOSIS — N186 End stage renal disease: Secondary | ICD-10-CM | POA: Diagnosis not present

## 2017-09-15 DIAGNOSIS — N2581 Secondary hyperparathyroidism of renal origin: Secondary | ICD-10-CM | POA: Diagnosis not present

## 2017-09-15 DIAGNOSIS — Z23 Encounter for immunization: Secondary | ICD-10-CM | POA: Diagnosis not present

## 2017-09-15 DIAGNOSIS — D631 Anemia in chronic kidney disease: Secondary | ICD-10-CM | POA: Diagnosis not present

## 2017-09-15 DIAGNOSIS — N186 End stage renal disease: Secondary | ICD-10-CM | POA: Diagnosis not present

## 2017-09-17 DIAGNOSIS — N186 End stage renal disease: Secondary | ICD-10-CM | POA: Diagnosis not present

## 2017-09-17 DIAGNOSIS — Z23 Encounter for immunization: Secondary | ICD-10-CM | POA: Diagnosis not present

## 2017-09-17 DIAGNOSIS — D631 Anemia in chronic kidney disease: Secondary | ICD-10-CM | POA: Diagnosis not present

## 2017-09-17 DIAGNOSIS — N2581 Secondary hyperparathyroidism of renal origin: Secondary | ICD-10-CM | POA: Diagnosis not present

## 2017-09-19 DIAGNOSIS — Z23 Encounter for immunization: Secondary | ICD-10-CM | POA: Diagnosis not present

## 2017-09-19 DIAGNOSIS — N186 End stage renal disease: Secondary | ICD-10-CM | POA: Diagnosis not present

## 2017-09-19 DIAGNOSIS — D631 Anemia in chronic kidney disease: Secondary | ICD-10-CM | POA: Diagnosis not present

## 2017-09-19 DIAGNOSIS — N2581 Secondary hyperparathyroidism of renal origin: Secondary | ICD-10-CM | POA: Diagnosis not present

## 2017-09-22 DIAGNOSIS — D631 Anemia in chronic kidney disease: Secondary | ICD-10-CM | POA: Diagnosis not present

## 2017-09-22 DIAGNOSIS — Z23 Encounter for immunization: Secondary | ICD-10-CM | POA: Diagnosis not present

## 2017-09-22 DIAGNOSIS — N2581 Secondary hyperparathyroidism of renal origin: Secondary | ICD-10-CM | POA: Diagnosis not present

## 2017-09-22 DIAGNOSIS — T8610 Unspecified complication of kidney transplant: Secondary | ICD-10-CM | POA: Diagnosis not present

## 2017-09-22 DIAGNOSIS — Z992 Dependence on renal dialysis: Secondary | ICD-10-CM | POA: Diagnosis not present

## 2017-09-22 DIAGNOSIS — N186 End stage renal disease: Secondary | ICD-10-CM | POA: Diagnosis not present

## 2017-09-24 DIAGNOSIS — D631 Anemia in chronic kidney disease: Secondary | ICD-10-CM | POA: Diagnosis not present

## 2017-09-24 DIAGNOSIS — Z23 Encounter for immunization: Secondary | ICD-10-CM | POA: Diagnosis not present

## 2017-09-24 DIAGNOSIS — N2581 Secondary hyperparathyroidism of renal origin: Secondary | ICD-10-CM | POA: Diagnosis not present

## 2017-09-24 DIAGNOSIS — N186 End stage renal disease: Secondary | ICD-10-CM | POA: Diagnosis not present

## 2017-09-26 DIAGNOSIS — N186 End stage renal disease: Secondary | ICD-10-CM | POA: Diagnosis not present

## 2017-09-26 DIAGNOSIS — Z23 Encounter for immunization: Secondary | ICD-10-CM | POA: Diagnosis not present

## 2017-09-26 DIAGNOSIS — D631 Anemia in chronic kidney disease: Secondary | ICD-10-CM | POA: Diagnosis not present

## 2017-09-26 DIAGNOSIS — N2581 Secondary hyperparathyroidism of renal origin: Secondary | ICD-10-CM | POA: Diagnosis not present

## 2017-09-29 DIAGNOSIS — D631 Anemia in chronic kidney disease: Secondary | ICD-10-CM | POA: Diagnosis not present

## 2017-09-29 DIAGNOSIS — N186 End stage renal disease: Secondary | ICD-10-CM | POA: Diagnosis not present

## 2017-09-29 DIAGNOSIS — N2581 Secondary hyperparathyroidism of renal origin: Secondary | ICD-10-CM | POA: Diagnosis not present

## 2017-09-29 DIAGNOSIS — Z23 Encounter for immunization: Secondary | ICD-10-CM | POA: Diagnosis not present

## 2017-10-01 DIAGNOSIS — N186 End stage renal disease: Secondary | ICD-10-CM | POA: Diagnosis not present

## 2017-10-01 DIAGNOSIS — N2581 Secondary hyperparathyroidism of renal origin: Secondary | ICD-10-CM | POA: Diagnosis not present

## 2017-10-01 DIAGNOSIS — Z23 Encounter for immunization: Secondary | ICD-10-CM | POA: Diagnosis not present

## 2017-10-01 DIAGNOSIS — D631 Anemia in chronic kidney disease: Secondary | ICD-10-CM | POA: Diagnosis not present

## 2017-10-03 DIAGNOSIS — N2581 Secondary hyperparathyroidism of renal origin: Secondary | ICD-10-CM | POA: Diagnosis not present

## 2017-10-03 DIAGNOSIS — N186 End stage renal disease: Secondary | ICD-10-CM | POA: Diagnosis not present

## 2017-10-03 DIAGNOSIS — Z23 Encounter for immunization: Secondary | ICD-10-CM | POA: Diagnosis not present

## 2017-10-03 DIAGNOSIS — D631 Anemia in chronic kidney disease: Secondary | ICD-10-CM | POA: Diagnosis not present

## 2017-10-06 DIAGNOSIS — N2581 Secondary hyperparathyroidism of renal origin: Secondary | ICD-10-CM | POA: Diagnosis not present

## 2017-10-06 DIAGNOSIS — N186 End stage renal disease: Secondary | ICD-10-CM | POA: Diagnosis not present

## 2017-10-06 DIAGNOSIS — Z23 Encounter for immunization: Secondary | ICD-10-CM | POA: Diagnosis not present

## 2017-10-06 DIAGNOSIS — D631 Anemia in chronic kidney disease: Secondary | ICD-10-CM | POA: Diagnosis not present

## 2017-10-08 DIAGNOSIS — N186 End stage renal disease: Secondary | ICD-10-CM | POA: Diagnosis not present

## 2017-10-08 DIAGNOSIS — N2581 Secondary hyperparathyroidism of renal origin: Secondary | ICD-10-CM | POA: Diagnosis not present

## 2017-10-08 DIAGNOSIS — Z23 Encounter for immunization: Secondary | ICD-10-CM | POA: Diagnosis not present

## 2017-10-08 DIAGNOSIS — D631 Anemia in chronic kidney disease: Secondary | ICD-10-CM | POA: Diagnosis not present

## 2017-10-10 DIAGNOSIS — N186 End stage renal disease: Secondary | ICD-10-CM | POA: Diagnosis not present

## 2017-10-10 DIAGNOSIS — N2581 Secondary hyperparathyroidism of renal origin: Secondary | ICD-10-CM | POA: Diagnosis not present

## 2017-10-10 DIAGNOSIS — D631 Anemia in chronic kidney disease: Secondary | ICD-10-CM | POA: Diagnosis not present

## 2017-10-10 DIAGNOSIS — Z23 Encounter for immunization: Secondary | ICD-10-CM | POA: Diagnosis not present

## 2017-10-13 DIAGNOSIS — N186 End stage renal disease: Secondary | ICD-10-CM | POA: Diagnosis not present

## 2017-10-13 DIAGNOSIS — Z23 Encounter for immunization: Secondary | ICD-10-CM | POA: Diagnosis not present

## 2017-10-13 DIAGNOSIS — D631 Anemia in chronic kidney disease: Secondary | ICD-10-CM | POA: Diagnosis not present

## 2017-10-13 DIAGNOSIS — N2581 Secondary hyperparathyroidism of renal origin: Secondary | ICD-10-CM | POA: Diagnosis not present

## 2017-10-15 DIAGNOSIS — N186 End stage renal disease: Secondary | ICD-10-CM | POA: Diagnosis not present

## 2017-10-15 DIAGNOSIS — N2581 Secondary hyperparathyroidism of renal origin: Secondary | ICD-10-CM | POA: Diagnosis not present

## 2017-10-15 DIAGNOSIS — Z23 Encounter for immunization: Secondary | ICD-10-CM | POA: Diagnosis not present

## 2017-10-15 DIAGNOSIS — D631 Anemia in chronic kidney disease: Secondary | ICD-10-CM | POA: Diagnosis not present

## 2017-10-17 DIAGNOSIS — D631 Anemia in chronic kidney disease: Secondary | ICD-10-CM | POA: Diagnosis not present

## 2017-10-17 DIAGNOSIS — N186 End stage renal disease: Secondary | ICD-10-CM | POA: Diagnosis not present

## 2017-10-17 DIAGNOSIS — N2581 Secondary hyperparathyroidism of renal origin: Secondary | ICD-10-CM | POA: Diagnosis not present

## 2017-10-17 DIAGNOSIS — Z23 Encounter for immunization: Secondary | ICD-10-CM | POA: Diagnosis not present

## 2017-10-20 DIAGNOSIS — N186 End stage renal disease: Secondary | ICD-10-CM | POA: Diagnosis not present

## 2017-10-20 DIAGNOSIS — D631 Anemia in chronic kidney disease: Secondary | ICD-10-CM | POA: Diagnosis not present

## 2017-10-20 DIAGNOSIS — N2581 Secondary hyperparathyroidism of renal origin: Secondary | ICD-10-CM | POA: Diagnosis not present

## 2017-10-20 DIAGNOSIS — Z23 Encounter for immunization: Secondary | ICD-10-CM | POA: Diagnosis not present

## 2017-10-22 DIAGNOSIS — T8610 Unspecified complication of kidney transplant: Secondary | ICD-10-CM | POA: Diagnosis not present

## 2017-10-22 DIAGNOSIS — N186 End stage renal disease: Secondary | ICD-10-CM | POA: Diagnosis not present

## 2017-10-22 DIAGNOSIS — Z992 Dependence on renal dialysis: Secondary | ICD-10-CM | POA: Diagnosis not present

## 2017-10-22 DIAGNOSIS — N2581 Secondary hyperparathyroidism of renal origin: Secondary | ICD-10-CM | POA: Diagnosis not present

## 2017-10-24 DIAGNOSIS — N186 End stage renal disease: Secondary | ICD-10-CM | POA: Diagnosis not present

## 2017-10-24 DIAGNOSIS — N2581 Secondary hyperparathyroidism of renal origin: Secondary | ICD-10-CM | POA: Diagnosis not present

## 2017-10-27 DIAGNOSIS — N186 End stage renal disease: Secondary | ICD-10-CM | POA: Diagnosis not present

## 2017-10-27 DIAGNOSIS — N2581 Secondary hyperparathyroidism of renal origin: Secondary | ICD-10-CM | POA: Diagnosis not present

## 2017-10-29 DIAGNOSIS — N2581 Secondary hyperparathyroidism of renal origin: Secondary | ICD-10-CM | POA: Diagnosis not present

## 2017-10-29 DIAGNOSIS — N186 End stage renal disease: Secondary | ICD-10-CM | POA: Diagnosis not present

## 2017-10-31 DIAGNOSIS — N186 End stage renal disease: Secondary | ICD-10-CM | POA: Diagnosis not present

## 2017-10-31 DIAGNOSIS — N2581 Secondary hyperparathyroidism of renal origin: Secondary | ICD-10-CM | POA: Diagnosis not present

## 2017-11-03 DIAGNOSIS — N2581 Secondary hyperparathyroidism of renal origin: Secondary | ICD-10-CM | POA: Diagnosis not present

## 2017-11-03 DIAGNOSIS — N186 End stage renal disease: Secondary | ICD-10-CM | POA: Diagnosis not present

## 2017-11-05 DIAGNOSIS — N186 End stage renal disease: Secondary | ICD-10-CM | POA: Diagnosis not present

## 2017-11-05 DIAGNOSIS — N2581 Secondary hyperparathyroidism of renal origin: Secondary | ICD-10-CM | POA: Diagnosis not present

## 2017-11-07 DIAGNOSIS — N2581 Secondary hyperparathyroidism of renal origin: Secondary | ICD-10-CM | POA: Diagnosis not present

## 2017-11-07 DIAGNOSIS — N186 End stage renal disease: Secondary | ICD-10-CM | POA: Diagnosis not present

## 2017-11-10 DIAGNOSIS — N2581 Secondary hyperparathyroidism of renal origin: Secondary | ICD-10-CM | POA: Diagnosis not present

## 2017-11-10 DIAGNOSIS — N186 End stage renal disease: Secondary | ICD-10-CM | POA: Diagnosis not present

## 2017-11-12 DIAGNOSIS — N186 End stage renal disease: Secondary | ICD-10-CM | POA: Diagnosis not present

## 2017-11-12 DIAGNOSIS — N2581 Secondary hyperparathyroidism of renal origin: Secondary | ICD-10-CM | POA: Diagnosis not present

## 2017-11-14 DIAGNOSIS — N2581 Secondary hyperparathyroidism of renal origin: Secondary | ICD-10-CM | POA: Diagnosis not present

## 2017-11-14 DIAGNOSIS — N186 End stage renal disease: Secondary | ICD-10-CM | POA: Diagnosis not present

## 2017-11-17 DIAGNOSIS — N186 End stage renal disease: Secondary | ICD-10-CM | POA: Diagnosis not present

## 2017-11-17 DIAGNOSIS — N2581 Secondary hyperparathyroidism of renal origin: Secondary | ICD-10-CM | POA: Diagnosis not present

## 2017-11-19 DIAGNOSIS — N186 End stage renal disease: Secondary | ICD-10-CM | POA: Diagnosis not present

## 2017-11-19 DIAGNOSIS — N2581 Secondary hyperparathyroidism of renal origin: Secondary | ICD-10-CM | POA: Diagnosis not present

## 2017-11-21 DIAGNOSIS — N186 End stage renal disease: Secondary | ICD-10-CM | POA: Diagnosis not present

## 2017-11-21 DIAGNOSIS — N2581 Secondary hyperparathyroidism of renal origin: Secondary | ICD-10-CM | POA: Diagnosis not present

## 2017-11-22 DIAGNOSIS — T8610 Unspecified complication of kidney transplant: Secondary | ICD-10-CM | POA: Diagnosis not present

## 2017-11-22 DIAGNOSIS — Z992 Dependence on renal dialysis: Secondary | ICD-10-CM | POA: Diagnosis not present

## 2017-11-22 DIAGNOSIS — N186 End stage renal disease: Secondary | ICD-10-CM | POA: Diagnosis not present

## 2017-11-24 DIAGNOSIS — N186 End stage renal disease: Secondary | ICD-10-CM | POA: Diagnosis not present

## 2017-11-24 DIAGNOSIS — D631 Anemia in chronic kidney disease: Secondary | ICD-10-CM | POA: Diagnosis not present

## 2017-11-24 DIAGNOSIS — N2581 Secondary hyperparathyroidism of renal origin: Secondary | ICD-10-CM | POA: Diagnosis not present

## 2017-11-26 DIAGNOSIS — N2581 Secondary hyperparathyroidism of renal origin: Secondary | ICD-10-CM | POA: Diagnosis not present

## 2017-11-26 DIAGNOSIS — D631 Anemia in chronic kidney disease: Secondary | ICD-10-CM | POA: Diagnosis not present

## 2017-11-26 DIAGNOSIS — N186 End stage renal disease: Secondary | ICD-10-CM | POA: Diagnosis not present

## 2017-11-28 DIAGNOSIS — N2581 Secondary hyperparathyroidism of renal origin: Secondary | ICD-10-CM | POA: Diagnosis not present

## 2017-11-28 DIAGNOSIS — N186 End stage renal disease: Secondary | ICD-10-CM | POA: Diagnosis not present

## 2017-11-28 DIAGNOSIS — D631 Anemia in chronic kidney disease: Secondary | ICD-10-CM | POA: Diagnosis not present

## 2017-12-01 DIAGNOSIS — D631 Anemia in chronic kidney disease: Secondary | ICD-10-CM | POA: Diagnosis not present

## 2017-12-01 DIAGNOSIS — N186 End stage renal disease: Secondary | ICD-10-CM | POA: Diagnosis not present

## 2017-12-01 DIAGNOSIS — N2581 Secondary hyperparathyroidism of renal origin: Secondary | ICD-10-CM | POA: Diagnosis not present

## 2017-12-02 DIAGNOSIS — M542 Cervicalgia: Secondary | ICD-10-CM | POA: Diagnosis not present

## 2017-12-02 DIAGNOSIS — R200 Anesthesia of skin: Secondary | ICD-10-CM | POA: Diagnosis not present

## 2017-12-03 DIAGNOSIS — N186 End stage renal disease: Secondary | ICD-10-CM | POA: Diagnosis not present

## 2017-12-03 DIAGNOSIS — D631 Anemia in chronic kidney disease: Secondary | ICD-10-CM | POA: Diagnosis not present

## 2017-12-03 DIAGNOSIS — N2581 Secondary hyperparathyroidism of renal origin: Secondary | ICD-10-CM | POA: Diagnosis not present

## 2017-12-04 DIAGNOSIS — D631 Anemia in chronic kidney disease: Secondary | ICD-10-CM | POA: Diagnosis not present

## 2017-12-04 DIAGNOSIS — N2581 Secondary hyperparathyroidism of renal origin: Secondary | ICD-10-CM | POA: Diagnosis not present

## 2017-12-04 DIAGNOSIS — N186 End stage renal disease: Secondary | ICD-10-CM | POA: Diagnosis not present

## 2017-12-08 DIAGNOSIS — N186 End stage renal disease: Secondary | ICD-10-CM | POA: Diagnosis not present

## 2017-12-08 DIAGNOSIS — D631 Anemia in chronic kidney disease: Secondary | ICD-10-CM | POA: Diagnosis not present

## 2017-12-08 DIAGNOSIS — N2581 Secondary hyperparathyroidism of renal origin: Secondary | ICD-10-CM | POA: Diagnosis not present

## 2017-12-10 DIAGNOSIS — D631 Anemia in chronic kidney disease: Secondary | ICD-10-CM | POA: Diagnosis not present

## 2017-12-10 DIAGNOSIS — N2581 Secondary hyperparathyroidism of renal origin: Secondary | ICD-10-CM | POA: Diagnosis not present

## 2017-12-10 DIAGNOSIS — N186 End stage renal disease: Secondary | ICD-10-CM | POA: Diagnosis not present

## 2017-12-12 DIAGNOSIS — N2581 Secondary hyperparathyroidism of renal origin: Secondary | ICD-10-CM | POA: Diagnosis not present

## 2017-12-12 DIAGNOSIS — N186 End stage renal disease: Secondary | ICD-10-CM | POA: Diagnosis not present

## 2017-12-12 DIAGNOSIS — D631 Anemia in chronic kidney disease: Secondary | ICD-10-CM | POA: Diagnosis not present

## 2017-12-14 DIAGNOSIS — D631 Anemia in chronic kidney disease: Secondary | ICD-10-CM | POA: Diagnosis present

## 2017-12-14 DIAGNOSIS — E871 Hypo-osmolality and hyponatremia: Secondary | ICD-10-CM | POA: Diagnosis present

## 2017-12-14 DIAGNOSIS — B259 Cytomegaloviral disease, unspecified: Secondary | ICD-10-CM | POA: Diagnosis present

## 2017-12-14 DIAGNOSIS — I10 Essential (primary) hypertension: Secondary | ICD-10-CM | POA: Diagnosis not present

## 2017-12-14 DIAGNOSIS — N2581 Secondary hyperparathyroidism of renal origin: Secondary | ICD-10-CM | POA: Diagnosis present

## 2017-12-14 DIAGNOSIS — Z992 Dependence on renal dialysis: Secondary | ICD-10-CM | POA: Diagnosis not present

## 2017-12-14 DIAGNOSIS — Z7982 Long term (current) use of aspirin: Secondary | ICD-10-CM | POA: Diagnosis not present

## 2017-12-14 DIAGNOSIS — Z9119 Patient's noncompliance with other medical treatment and regimen: Secondary | ICD-10-CM | POA: Diagnosis not present

## 2017-12-14 DIAGNOSIS — R140 Abdominal distension (gaseous): Secondary | ICD-10-CM | POA: Diagnosis not present

## 2017-12-14 DIAGNOSIS — Z9114 Patient's other noncompliance with medication regimen: Secondary | ICD-10-CM | POA: Diagnosis not present

## 2017-12-14 DIAGNOSIS — T8611 Kidney transplant rejection: Secondary | ICD-10-CM | POA: Diagnosis not present

## 2017-12-14 DIAGNOSIS — J9690 Respiratory failure, unspecified, unspecified whether with hypoxia or hypercapnia: Secondary | ICD-10-CM | POA: Diagnosis not present

## 2017-12-14 DIAGNOSIS — T8612 Kidney transplant failure: Secondary | ICD-10-CM | POA: Diagnosis present

## 2017-12-14 DIAGNOSIS — Z9911 Dependence on respirator [ventilator] status: Secondary | ICD-10-CM | POA: Diagnosis not present

## 2017-12-14 DIAGNOSIS — Z9981 Dependence on supplemental oxygen: Secondary | ICD-10-CM | POA: Diagnosis not present

## 2017-12-14 DIAGNOSIS — N186 End stage renal disease: Secondary | ICD-10-CM | POA: Diagnosis present

## 2017-12-14 DIAGNOSIS — R079 Chest pain, unspecified: Secondary | ICD-10-CM | POA: Diagnosis not present

## 2017-12-14 DIAGNOSIS — Z4822 Encounter for aftercare following kidney transplant: Secondary | ICD-10-CM | POA: Diagnosis not present

## 2017-12-14 DIAGNOSIS — T8619 Other complication of kidney transplant: Secondary | ICD-10-CM | POA: Diagnosis not present

## 2017-12-14 DIAGNOSIS — R7989 Other specified abnormal findings of blood chemistry: Secondary | ICD-10-CM | POA: Diagnosis not present

## 2017-12-14 DIAGNOSIS — Z781 Physical restraint status: Secondary | ICD-10-CM | POA: Diagnosis not present

## 2017-12-14 DIAGNOSIS — I120 Hypertensive chronic kidney disease with stage 5 chronic kidney disease or end stage renal disease: Secondary | ICD-10-CM | POA: Diagnosis present

## 2017-12-14 DIAGNOSIS — Z430 Encounter for attention to tracheostomy: Secondary | ICD-10-CM | POA: Diagnosis not present

## 2017-12-14 DIAGNOSIS — N2889 Other specified disorders of kidney and ureter: Secondary | ICD-10-CM | POA: Diagnosis not present

## 2017-12-14 DIAGNOSIS — Z5181 Encounter for therapeutic drug level monitoring: Secondary | ICD-10-CM | POA: Diagnosis not present

## 2017-12-14 DIAGNOSIS — Z01818 Encounter for other preprocedural examination: Secondary | ICD-10-CM | POA: Diagnosis not present

## 2017-12-14 DIAGNOSIS — Z940 Kidney transplant status: Secondary | ICD-10-CM | POA: Diagnosis not present

## 2017-12-14 DIAGNOSIS — E8770 Fluid overload, unspecified: Secondary | ICD-10-CM | POA: Diagnosis not present

## 2017-12-14 DIAGNOSIS — R790 Abnormal level of blood mineral: Secondary | ICD-10-CM | POA: Diagnosis not present

## 2017-12-14 DIAGNOSIS — J90 Pleural effusion, not elsewhere classified: Secondary | ICD-10-CM | POA: Diagnosis not present

## 2017-12-14 DIAGNOSIS — I701 Atherosclerosis of renal artery: Secondary | ICD-10-CM | POA: Diagnosis present

## 2017-12-14 DIAGNOSIS — R9431 Abnormal electrocardiogram [ECG] [EKG]: Secondary | ICD-10-CM | POA: Diagnosis not present

## 2017-12-14 DIAGNOSIS — I517 Cardiomegaly: Secondary | ICD-10-CM | POA: Diagnosis not present

## 2017-12-14 DIAGNOSIS — Z79899 Other long term (current) drug therapy: Secondary | ICD-10-CM | POA: Diagnosis not present

## 2017-12-14 DIAGNOSIS — Z9889 Other specified postprocedural states: Secondary | ICD-10-CM | POA: Diagnosis not present

## 2017-12-14 DIAGNOSIS — Z7682 Awaiting organ transplant status: Secondary | ICD-10-CM | POA: Diagnosis not present

## 2017-12-14 DIAGNOSIS — R739 Hyperglycemia, unspecified: Secondary | ICD-10-CM | POA: Diagnosis present

## 2017-12-14 DIAGNOSIS — N051 Unspecified nephritic syndrome with focal and segmental glomerular lesions: Secondary | ICD-10-CM | POA: Diagnosis not present

## 2017-12-14 DIAGNOSIS — E875 Hyperkalemia: Secondary | ICD-10-CM | POA: Diagnosis present

## 2017-12-14 DIAGNOSIS — D72829 Elevated white blood cell count, unspecified: Secondary | ICD-10-CM | POA: Diagnosis not present

## 2017-12-14 DIAGNOSIS — D62 Acute posthemorrhagic anemia: Secondary | ICD-10-CM | POA: Diagnosis not present

## 2017-12-14 DIAGNOSIS — I8290 Acute embolism and thrombosis of unspecified vein: Secondary | ICD-10-CM | POA: Diagnosis not present

## 2017-12-14 DIAGNOSIS — R760 Raised antibody titer: Secondary | ICD-10-CM | POA: Diagnosis not present

## 2017-12-22 DIAGNOSIS — Z940 Kidney transplant status: Secondary | ICD-10-CM | POA: Diagnosis not present

## 2017-12-22 DIAGNOSIS — Z4822 Encounter for aftercare following kidney transplant: Secondary | ICD-10-CM | POA: Diagnosis not present

## 2017-12-22 DIAGNOSIS — I10 Essential (primary) hypertension: Secondary | ICD-10-CM | POA: Diagnosis not present

## 2017-12-22 DIAGNOSIS — Z7952 Long term (current) use of systemic steroids: Secondary | ICD-10-CM | POA: Diagnosis not present

## 2017-12-22 DIAGNOSIS — I151 Hypertension secondary to other renal disorders: Secondary | ICD-10-CM | POA: Diagnosis not present

## 2017-12-22 DIAGNOSIS — B259 Cytomegaloviral disease, unspecified: Secondary | ICD-10-CM | POA: Diagnosis not present

## 2017-12-22 DIAGNOSIS — E869 Volume depletion, unspecified: Secondary | ICD-10-CM | POA: Diagnosis not present

## 2017-12-22 DIAGNOSIS — K5900 Constipation, unspecified: Secondary | ICD-10-CM | POA: Diagnosis not present

## 2017-12-22 DIAGNOSIS — D649 Anemia, unspecified: Secondary | ICD-10-CM | POA: Diagnosis not present

## 2017-12-22 DIAGNOSIS — T8619 Other complication of kidney transplant: Secondary | ICD-10-CM | POA: Diagnosis not present

## 2017-12-22 DIAGNOSIS — T8611 Kidney transplant rejection: Secondary | ICD-10-CM | POA: Diagnosis not present

## 2017-12-22 DIAGNOSIS — E876 Hypokalemia: Secondary | ICD-10-CM | POA: Diagnosis not present

## 2017-12-22 DIAGNOSIS — Z792 Long term (current) use of antibiotics: Secondary | ICD-10-CM | POA: Diagnosis not present

## 2017-12-22 DIAGNOSIS — D899 Disorder involving the immune mechanism, unspecified: Secondary | ICD-10-CM | POA: Diagnosis not present

## 2017-12-22 DIAGNOSIS — E875 Hyperkalemia: Secondary | ICD-10-CM | POA: Diagnosis not present

## 2017-12-22 DIAGNOSIS — Z79899 Other long term (current) drug therapy: Secondary | ICD-10-CM | POA: Diagnosis not present

## 2017-12-22 DIAGNOSIS — N1330 Unspecified hydronephrosis: Secondary | ICD-10-CM | POA: Diagnosis not present

## 2017-12-23 DIAGNOSIS — T8611 Kidney transplant rejection: Secondary | ICD-10-CM | POA: Diagnosis not present

## 2017-12-24 DIAGNOSIS — D631 Anemia in chronic kidney disease: Secondary | ICD-10-CM | POA: Diagnosis not present

## 2017-12-24 DIAGNOSIS — T8619 Other complication of kidney transplant: Secondary | ICD-10-CM | POA: Diagnosis not present

## 2017-12-24 DIAGNOSIS — Z5181 Encounter for therapeutic drug level monitoring: Secondary | ICD-10-CM | POA: Diagnosis not present

## 2017-12-24 DIAGNOSIS — I151 Hypertension secondary to other renal disorders: Secondary | ICD-10-CM | POA: Diagnosis not present

## 2017-12-24 DIAGNOSIS — Z940 Kidney transplant status: Secondary | ICD-10-CM | POA: Diagnosis not present

## 2017-12-24 DIAGNOSIS — N186 End stage renal disease: Secondary | ICD-10-CM | POA: Diagnosis not present

## 2017-12-24 DIAGNOSIS — N2889 Other specified disorders of kidney and ureter: Secondary | ICD-10-CM | POA: Diagnosis not present

## 2017-12-24 DIAGNOSIS — E872 Acidosis: Secondary | ICD-10-CM | POA: Diagnosis not present

## 2017-12-24 DIAGNOSIS — Z79899 Other long term (current) drug therapy: Secondary | ICD-10-CM | POA: Diagnosis not present

## 2017-12-24 DIAGNOSIS — E212 Other hyperparathyroidism: Secondary | ICD-10-CM | POA: Diagnosis not present

## 2017-12-24 DIAGNOSIS — I120 Hypertensive chronic kidney disease with stage 5 chronic kidney disease or end stage renal disease: Secondary | ICD-10-CM | POA: Diagnosis not present

## 2017-12-24 DIAGNOSIS — D899 Disorder involving the immune mechanism, unspecified: Secondary | ICD-10-CM | POA: Diagnosis not present

## 2017-12-24 DIAGNOSIS — E213 Hyperparathyroidism, unspecified: Secondary | ICD-10-CM | POA: Diagnosis not present

## 2017-12-26 DIAGNOSIS — I10 Essential (primary) hypertension: Secondary | ICD-10-CM | POA: Diagnosis not present

## 2017-12-26 DIAGNOSIS — Z79899 Other long term (current) drug therapy: Secondary | ICD-10-CM | POA: Diagnosis not present

## 2017-12-26 DIAGNOSIS — Z940 Kidney transplant status: Secondary | ICD-10-CM | POA: Diagnosis not present

## 2017-12-26 DIAGNOSIS — Z7952 Long term (current) use of systemic steroids: Secondary | ICD-10-CM | POA: Diagnosis not present

## 2017-12-26 DIAGNOSIS — I120 Hypertensive chronic kidney disease with stage 5 chronic kidney disease or end stage renal disease: Secondary | ICD-10-CM | POA: Diagnosis not present

## 2017-12-26 DIAGNOSIS — Z792 Long term (current) use of antibiotics: Secondary | ICD-10-CM | POA: Diagnosis not present

## 2017-12-26 DIAGNOSIS — N186 End stage renal disease: Secondary | ICD-10-CM | POA: Diagnosis not present

## 2017-12-26 DIAGNOSIS — D649 Anemia, unspecified: Secondary | ICD-10-CM | POA: Diagnosis not present

## 2017-12-26 DIAGNOSIS — E872 Acidosis: Secondary | ICD-10-CM | POA: Diagnosis not present

## 2017-12-26 DIAGNOSIS — E875 Hyperkalemia: Secondary | ICD-10-CM | POA: Diagnosis not present

## 2017-12-29 DIAGNOSIS — I10 Essential (primary) hypertension: Secondary | ICD-10-CM | POA: Diagnosis not present

## 2017-12-29 DIAGNOSIS — Z940 Kidney transplant status: Secondary | ICD-10-CM | POA: Diagnosis not present

## 2017-12-29 DIAGNOSIS — Z7952 Long term (current) use of systemic steroids: Secondary | ICD-10-CM | POA: Diagnosis not present

## 2017-12-29 DIAGNOSIS — E875 Hyperkalemia: Secondary | ICD-10-CM | POA: Diagnosis not present

## 2017-12-29 DIAGNOSIS — E872 Acidosis: Secondary | ICD-10-CM | POA: Diagnosis not present

## 2017-12-29 DIAGNOSIS — Z7982 Long term (current) use of aspirin: Secondary | ICD-10-CM | POA: Diagnosis not present

## 2017-12-29 DIAGNOSIS — D649 Anemia, unspecified: Secondary | ICD-10-CM | POA: Diagnosis not present

## 2017-12-29 DIAGNOSIS — Z4822 Encounter for aftercare following kidney transplant: Secondary | ICD-10-CM | POA: Diagnosis not present

## 2017-12-29 DIAGNOSIS — Z79899 Other long term (current) drug therapy: Secondary | ICD-10-CM | POA: Diagnosis not present

## 2017-12-29 DIAGNOSIS — Z792 Long term (current) use of antibiotics: Secondary | ICD-10-CM | POA: Diagnosis not present

## 2017-12-29 DIAGNOSIS — T8619 Other complication of kidney transplant: Secondary | ICD-10-CM | POA: Diagnosis not present

## 2017-12-29 DIAGNOSIS — T8611 Kidney transplant rejection: Secondary | ICD-10-CM | POA: Diagnosis not present

## 2017-12-31 DIAGNOSIS — E872 Acidosis: Secondary | ICD-10-CM | POA: Diagnosis not present

## 2017-12-31 DIAGNOSIS — Z79899 Other long term (current) drug therapy: Secondary | ICD-10-CM | POA: Diagnosis not present

## 2017-12-31 DIAGNOSIS — Z792 Long term (current) use of antibiotics: Secondary | ICD-10-CM | POA: Diagnosis not present

## 2017-12-31 DIAGNOSIS — I10 Essential (primary) hypertension: Secondary | ICD-10-CM | POA: Diagnosis not present

## 2017-12-31 DIAGNOSIS — I120 Hypertensive chronic kidney disease with stage 5 chronic kidney disease or end stage renal disease: Secondary | ICD-10-CM | POA: Diagnosis not present

## 2017-12-31 DIAGNOSIS — T8619 Other complication of kidney transplant: Secondary | ICD-10-CM | POA: Diagnosis not present

## 2017-12-31 DIAGNOSIS — E649 Sequelae of unspecified nutritional deficiency: Secondary | ICD-10-CM | POA: Diagnosis not present

## 2017-12-31 DIAGNOSIS — I951 Orthostatic hypotension: Secondary | ICD-10-CM | POA: Diagnosis not present

## 2017-12-31 DIAGNOSIS — N186 End stage renal disease: Secondary | ICD-10-CM | POA: Diagnosis not present

## 2017-12-31 DIAGNOSIS — E869 Volume depletion, unspecified: Secondary | ICD-10-CM | POA: Diagnosis not present

## 2017-12-31 DIAGNOSIS — T8611 Kidney transplant rejection: Secondary | ICD-10-CM | POA: Diagnosis not present

## 2017-12-31 DIAGNOSIS — E875 Hyperkalemia: Secondary | ICD-10-CM | POA: Diagnosis not present

## 2017-12-31 DIAGNOSIS — E212 Other hyperparathyroidism: Secondary | ICD-10-CM | POA: Diagnosis not present

## 2017-12-31 DIAGNOSIS — Z940 Kidney transplant status: Secondary | ICD-10-CM | POA: Diagnosis not present

## 2018-01-02 DIAGNOSIS — T8619 Other complication of kidney transplant: Secondary | ICD-10-CM | POA: Diagnosis not present

## 2018-01-02 DIAGNOSIS — Z940 Kidney transplant status: Secondary | ICD-10-CM | POA: Diagnosis not present

## 2018-01-02 DIAGNOSIS — E875 Hyperkalemia: Secondary | ICD-10-CM | POA: Diagnosis not present

## 2018-01-02 DIAGNOSIS — Z792 Long term (current) use of antibiotics: Secondary | ICD-10-CM | POA: Diagnosis not present

## 2018-01-02 DIAGNOSIS — Z79899 Other long term (current) drug therapy: Secondary | ICD-10-CM | POA: Diagnosis not present

## 2018-01-02 DIAGNOSIS — I120 Hypertensive chronic kidney disease with stage 5 chronic kidney disease or end stage renal disease: Secondary | ICD-10-CM | POA: Diagnosis not present

## 2018-01-02 DIAGNOSIS — N186 End stage renal disease: Secondary | ICD-10-CM | POA: Diagnosis not present

## 2018-01-02 DIAGNOSIS — E872 Acidosis: Secondary | ICD-10-CM | POA: Diagnosis not present

## 2018-01-02 DIAGNOSIS — I10 Essential (primary) hypertension: Secondary | ICD-10-CM | POA: Diagnosis not present

## 2018-01-02 DIAGNOSIS — T8611 Kidney transplant rejection: Secondary | ICD-10-CM | POA: Diagnosis not present

## 2018-01-02 DIAGNOSIS — R197 Diarrhea, unspecified: Secondary | ICD-10-CM | POA: Diagnosis not present

## 2018-01-02 DIAGNOSIS — D649 Anemia, unspecified: Secondary | ICD-10-CM | POA: Diagnosis not present

## 2018-01-05 DIAGNOSIS — Z4822 Encounter for aftercare following kidney transplant: Secondary | ICD-10-CM | POA: Diagnosis not present

## 2018-01-05 DIAGNOSIS — Z7952 Long term (current) use of systemic steroids: Secondary | ICD-10-CM | POA: Diagnosis not present

## 2018-01-05 DIAGNOSIS — Z79899 Other long term (current) drug therapy: Secondary | ICD-10-CM | POA: Diagnosis not present

## 2018-01-05 DIAGNOSIS — I10 Essential (primary) hypertension: Secondary | ICD-10-CM | POA: Diagnosis not present

## 2018-01-05 DIAGNOSIS — E872 Acidosis: Secondary | ICD-10-CM | POA: Diagnosis not present

## 2018-01-05 DIAGNOSIS — D649 Anemia, unspecified: Secondary | ICD-10-CM | POA: Diagnosis not present

## 2018-01-05 DIAGNOSIS — Z792 Long term (current) use of antibiotics: Secondary | ICD-10-CM | POA: Diagnosis not present

## 2018-01-05 DIAGNOSIS — Z940 Kidney transplant status: Secondary | ICD-10-CM | POA: Diagnosis not present

## 2018-01-05 DIAGNOSIS — Z5181 Encounter for therapeutic drug level monitoring: Secondary | ICD-10-CM | POA: Diagnosis not present

## 2018-01-05 DIAGNOSIS — E212 Other hyperparathyroidism: Secondary | ICD-10-CM | POA: Diagnosis not present

## 2018-01-05 DIAGNOSIS — I951 Orthostatic hypotension: Secondary | ICD-10-CM | POA: Diagnosis not present

## 2018-01-05 DIAGNOSIS — E869 Volume depletion, unspecified: Secondary | ICD-10-CM | POA: Diagnosis not present

## 2018-01-05 DIAGNOSIS — E875 Hyperkalemia: Secondary | ICD-10-CM | POA: Diagnosis not present

## 2018-01-06 DIAGNOSIS — T8611 Kidney transplant rejection: Secondary | ICD-10-CM | POA: Diagnosis not present

## 2018-01-07 DIAGNOSIS — T8611 Kidney transplant rejection: Secondary | ICD-10-CM | POA: Diagnosis not present

## 2018-01-08 DIAGNOSIS — T8611 Kidney transplant rejection: Secondary | ICD-10-CM | POA: Diagnosis not present

## 2018-01-08 DIAGNOSIS — I151 Hypertension secondary to other renal disorders: Secondary | ICD-10-CM | POA: Diagnosis not present

## 2018-01-08 DIAGNOSIS — Z79899 Other long term (current) drug therapy: Secondary | ICD-10-CM | POA: Diagnosis not present

## 2018-01-08 DIAGNOSIS — N186 End stage renal disease: Secondary | ICD-10-CM | POA: Diagnosis not present

## 2018-01-08 DIAGNOSIS — Z7952 Long term (current) use of systemic steroids: Secondary | ICD-10-CM | POA: Diagnosis not present

## 2018-01-08 DIAGNOSIS — Z940 Kidney transplant status: Secondary | ICD-10-CM | POA: Diagnosis not present

## 2018-01-08 DIAGNOSIS — E872 Acidosis: Secondary | ICD-10-CM | POA: Diagnosis not present

## 2018-01-08 DIAGNOSIS — N2889 Other specified disorders of kidney and ureter: Secondary | ICD-10-CM | POA: Diagnosis not present

## 2018-01-08 DIAGNOSIS — Z792 Long term (current) use of antibiotics: Secondary | ICD-10-CM | POA: Diagnosis not present

## 2018-01-08 DIAGNOSIS — Z4822 Encounter for aftercare following kidney transplant: Secondary | ICD-10-CM | POA: Diagnosis not present

## 2018-01-08 DIAGNOSIS — D899 Disorder involving the immune mechanism, unspecified: Secondary | ICD-10-CM | POA: Diagnosis not present

## 2018-01-08 DIAGNOSIS — E876 Hypokalemia: Secondary | ICD-10-CM | POA: Diagnosis not present

## 2018-01-08 DIAGNOSIS — B259 Cytomegaloviral disease, unspecified: Secondary | ICD-10-CM | POA: Diagnosis not present

## 2018-01-08 DIAGNOSIS — I10 Essential (primary) hypertension: Secondary | ICD-10-CM | POA: Diagnosis not present

## 2018-01-12 DIAGNOSIS — E872 Acidosis: Secondary | ICD-10-CM | POA: Diagnosis not present

## 2018-01-12 DIAGNOSIS — D631 Anemia in chronic kidney disease: Secondary | ICD-10-CM | POA: Diagnosis not present

## 2018-01-12 DIAGNOSIS — E649 Sequelae of unspecified nutritional deficiency: Secondary | ICD-10-CM | POA: Diagnosis not present

## 2018-01-12 DIAGNOSIS — Z792 Long term (current) use of antibiotics: Secondary | ICD-10-CM | POA: Diagnosis not present

## 2018-01-12 DIAGNOSIS — Z7982 Long term (current) use of aspirin: Secondary | ICD-10-CM | POA: Diagnosis not present

## 2018-01-12 DIAGNOSIS — T8611 Kidney transplant rejection: Secondary | ICD-10-CM | POA: Diagnosis not present

## 2018-01-12 DIAGNOSIS — I951 Orthostatic hypotension: Secondary | ICD-10-CM | POA: Diagnosis not present

## 2018-01-12 DIAGNOSIS — N186 End stage renal disease: Secondary | ICD-10-CM | POA: Diagnosis not present

## 2018-01-12 DIAGNOSIS — I10 Essential (primary) hypertension: Secondary | ICD-10-CM | POA: Diagnosis not present

## 2018-01-12 DIAGNOSIS — Z940 Kidney transplant status: Secondary | ICD-10-CM | POA: Diagnosis not present

## 2018-01-12 DIAGNOSIS — Z79899 Other long term (current) drug therapy: Secondary | ICD-10-CM | POA: Diagnosis not present

## 2018-01-12 DIAGNOSIS — E860 Dehydration: Secondary | ICD-10-CM | POA: Diagnosis not present

## 2018-01-14 DIAGNOSIS — Z79899 Other long term (current) drug therapy: Secondary | ICD-10-CM | POA: Diagnosis not present

## 2018-01-14 DIAGNOSIS — Z940 Kidney transplant status: Secondary | ICD-10-CM | POA: Diagnosis not present

## 2018-01-14 DIAGNOSIS — Z5181 Encounter for therapeutic drug level monitoring: Secondary | ICD-10-CM | POA: Diagnosis not present

## 2018-01-15 DIAGNOSIS — Z4822 Encounter for aftercare following kidney transplant: Secondary | ICD-10-CM | POA: Diagnosis not present

## 2018-01-15 DIAGNOSIS — T8149XA Infection following a procedure, other surgical site, initial encounter: Secondary | ICD-10-CM | POA: Diagnosis not present

## 2018-01-15 DIAGNOSIS — N041 Nephrotic syndrome with focal and segmental glomerular lesions: Secondary | ICD-10-CM | POA: Diagnosis not present

## 2018-01-15 DIAGNOSIS — Z5181 Encounter for therapeutic drug level monitoring: Secondary | ICD-10-CM | POA: Diagnosis not present

## 2018-01-15 DIAGNOSIS — Z940 Kidney transplant status: Secondary | ICD-10-CM | POA: Diagnosis not present

## 2018-01-15 DIAGNOSIS — Z79899 Other long term (current) drug therapy: Secondary | ICD-10-CM | POA: Diagnosis not present

## 2018-01-15 DIAGNOSIS — D899 Disorder involving the immune mechanism, unspecified: Secondary | ICD-10-CM | POA: Diagnosis not present

## 2018-01-16 DIAGNOSIS — T8149XD Infection following a procedure, other surgical site, subsequent encounter: Secondary | ICD-10-CM | POA: Diagnosis not present

## 2018-01-16 DIAGNOSIS — Z940 Kidney transplant status: Secondary | ICD-10-CM | POA: Diagnosis not present

## 2018-01-17 DIAGNOSIS — Z940 Kidney transplant status: Secondary | ICD-10-CM | POA: Insufficient documentation

## 2018-01-17 DIAGNOSIS — Z4889 Encounter for other specified surgical aftercare: Secondary | ICD-10-CM | POA: Diagnosis not present

## 2018-01-17 DIAGNOSIS — Z79899 Other long term (current) drug therapy: Secondary | ICD-10-CM | POA: Insufficient documentation

## 2018-01-17 DIAGNOSIS — Z4801 Encounter for change or removal of surgical wound dressing: Secondary | ICD-10-CM | POA: Diagnosis not present

## 2018-01-18 ENCOUNTER — Emergency Department (HOSPITAL_COMMUNITY)
Admission: EM | Admit: 2018-01-18 | Discharge: 2018-01-18 | Disposition: A | Discharge status: Home / Self Care | Payer: Medicare Other | Attending: Emergency Medicine | Admitting: Emergency Medicine

## 2018-01-18 ENCOUNTER — Encounter (HOSPITAL_COMMUNITY): Payer: Self-pay | Admitting: Nurse Practitioner

## 2018-01-18 ENCOUNTER — Other Ambulatory Visit: Payer: Self-pay

## 2018-01-18 DIAGNOSIS — Z4889 Encounter for other specified surgical aftercare: Secondary | ICD-10-CM

## 2018-01-18 NOTE — ED Provider Notes (Signed)
[facility] DEPT Provider Note   CSN: [id number] Arrival date & time: 01/17/18  2342     History   Chief Complaint Chief Complaint  Patient presents with  . Wound Vac Malfunction    HPI Kerry Cook is a 32 y.o. female.  32 year old female with a history of chronic kidney disease (s/p kidney transplant 1 month ago) and hypertension presents for wound VAC assessment.  She reports difficulty with wound healing for which a wound VAC was placed on Friday.  She began to have fair messages beginning 24 hours ago.  These have spontaneously resolved on 2 separate occasions.  Upon recurrence tonight, patient called her transplant office and they advised that it be evaluated in the ED for potential malfunction.  The patient states that it has started to function appropriately again.  With error message of "blockage", the patient denies any kinking of the tubing or associated abdominal pain or discomfort.  Denies any loss of suction or seal.  The history is provided by the patient. No language interpreter was used.    Past Medical History:  Diagnosis Date  . Chronic kidney disease   . Dialysis patient ([place])   . History of kidney transplant 2012   kidney failure due to hypertension  . Hypertension   . Multiple gastric ulcers   . Pneumonia 01/23/2015    Patient Active Problem List   Diagnosis Date Noted  . Cellulitis 04/22/2017  . Cellulitis of left upper extremity 04/22/2017  . Generalized abdominal pain   . Medicare annual wellness visit, initial 10/31/2015  . Cephalalgia   . Headache 06/07/2015  . Hypertension   . History of kidney transplant   . Anemia in chronic kidney disease 10/01/2007  . End stage renal disease on dialysis ([place]) 10/01/2007  . PAP SMEAR, LGSIL, ABNORMAL 10/01/2007  . PROTEINURIA 08/21/2006    Past Surgical History:  Procedure Laterality Date  . ESOPHAGOGASTRODUODENOSCOPY N/A 04/01/2016   Procedure:  ESOPHAGOGASTRODUODENOSCOPY (EGD);  Surgeon: Gatha Mayer, MD;  Location[facility]COPY;  Service: Endoscopy;  Laterality: N/A;  . ESOPHAGOGASTRODUODENOSCOPY (EGD) WITH PROPOFOL N/A 11/20/2016   Procedure: ESOPHAGOGASTRODUODENOSCOPY (EGD) WITH PROPOFOL;  Surgeon: Doran Stabler, MD;  Location: [facility];  Service: Endoscopy;  Laterality: N/A;  . KIDNEY TRANSPLANT Bilateral 2012     OB History    Gravida  1   Para      Term      Preterm      AB  1   Living  0     SAB  1   TAB      Ectopic      Multiple      Live Births               Home Medications    Prior to Admission medications   Medication Sig Start Date End Date Taking? Authorizing Provider  amLODipine (NORVASC) 10 MG tablet Take 1 tablet (10 mg total) by mouth every evening. Patient taking differently: Take 10 mg by mouth daily.  09/13/15  Yes [name], MD  cephALEXin (KEFLEX) 250 MG capsule Take 250 mg by mouth 2 (two) times daily. 01/15/18  Yes [provider]  cinacalcet (SENSIPAR) 30 MG tablet Take 30 mg by mouth daily. 01/18/18  Yes [provider]  cloNIDine (CATAPRES) 0.1 MG tablet Take 0.1 mg by mouth 2 (two) times daily.   Yes [provider]  HYDROcodone-acetaminophen (NORCO/VICODIN) 5-325 MG tablet Take 1 tablet by mouth  every 4 (four) hours as needed for moderate pain.  01/17/18  Yes [provider]  labetalol (NORMODYNE) 200 MG tablet Take 1 tablet (200 mg total) by mouth 2 (two) times daily. 04/02/16  Yes Elgergawy, Silver Huguenin, MD  mycophenolate (MYFORTIC) 180 MG EC tablet Take 720 mg by mouth 2 (two) times daily. 01/15/18  Yes [provider]  NIFEdipine (PROCARDIA XL/ADALAT-CC) 90 MG 24 hr tablet Take 90 mg by mouth daily. 01/14/18  Yes [provider]  NIFEdipine (PROCARDIA-XL/ADALAT-CC/NIFEDICAL-XL) 30 MG 24 hr tablet Take 30 mg by mouth daily. 01/12/18  Yes [provider]  pantoprazole (PROTONIX) 40 MG tablet Take 40 mg  by mouth daily. 01/15/18  Yes [provider]  predniSONE (DELTASONE) 5 MG tablet Take 10 mg by mouth daily. 01/15/18  Yes [provider]  PROGRAF 1 MG capsule Take 3-4 mg by mouth See admin instructions. Take  4 capsules by mouth in the morning and 3 capsules by mouth in the evening. 01/15/18  Yes [provider]  sulfamethoxazole-trimethoprim (BACTRIM,SEPTRA) 400-80 MG tablet Take 1 tablet by mouth every Monday, Wednesday, and Friday. 01/15/18  Yes [provider]  tamsulosin (FLOMAX) 0.4 MG CAPS capsule Take 0.4 mg by mouth 2 (two) times daily. 12/24/17  Yes [provider]  valGANciclovir (VALCYTE) 450 MG tablet Take 450 mg by mouth daily. 01/15/18  Yes [provider]  azithromycin (ZITHROMAX) 250 MG tablet Take 1 tablet (250 mg total) by mouth daily. Take first 2 tablets together, then 1 every day until finished. Patient not taking: Reported on 01/18/2018 08/27/17   Camila Li C, PA-C  doxycycline (VIBRAMYCIN) 100 MG capsule Take 1 capsule (100 mg total) by mouth 2 (two) times daily. Patient not taking: Reported on 01/18/2018 09/08/17   Ward, Ozella Almond, PA-C    Family History Family History  Problem Relation Age of Onset  . Kidney disease Father   . Lupus Maternal Grandmother   . Cancer Maternal Grandfather   . Colon cancer Neg Hx   . Stomach cancer Neg Hx   . Rectal cancer Neg Hx   . Esophageal cancer Neg Hx   . Liver cancer Neg Hx     Social History Social History   Tobacco Use  . Smoking status: Never Smoker  . Smokeless tobacco: Never Used  Substance Use Topics  . Alcohol use: No    Alcohol/week: 0.0 oz  . Drug use: No     Allergies   Zosyn [piperacillin sod-tazobactam so]   Review of Systems Review of Systems Ten systems reviewed and are negative for acute change, except as noted in the HPI.    Physical Exam Updated Vital Signs BP (!) 145/90 (BP Location: Right Arm)   Pulse 81   Temp 98.4 F (36.9 C)  (Oral)   Resp 14   Ht 5' (1.524 m)   Wt 56.7 kg (125 lb)   SpO2 100%   BMI 24.41 kg/m   Physical Exam  Constitutional: She is oriented to person, place, and time. She appears well-developed and well-nourished. No distress.  Nontoxic, calm.  HENT:  Head: Normocephalic and atraumatic.  Eyes: Conjunctivae and EOM are normal. No scleral icterus.  Neck: Normal range of motion.  Pulmonary/Chest: Effort normal. No respiratory distress.  Abdominal: Soft. She exhibits no mass. There is no tenderness. There is no guarding.  Soft, nontender abdomen. Wound vac noted to the LLQ. Suction is sufficient; functioning appropriately.  Musculoskeletal: Normal range of motion.  Neurological: She is  alert and oriented to person, place, and time. She exhibits normal muscle tone. Coordination normal.  Skin: Skin is warm and dry. No rash noted. She is not diaphoretic. No erythema. No pallor.  Psychiatric: She has a normal mood and affect. Her behavior is normal.  Nursing note and vitals reviewed.    ED Treatments / Results  Labs (all labs ordered are listed, but only abnormal results are displayed) Labs Reviewed - No data to display  EKG None  Radiology No results found.  Procedures Procedures (including critical care time)  Medications Ordered in ED Medications - No data to display   Initial Impression / Assessment and Plan / ED Course  I have reviewed the triage vital signs and the nursing notes.  Pertinent labs & imaging results that were available during my care of the patient were reviewed by me and considered in my medical decision making (see chart for details).     32 year old female presents to the emergency department for complaints of intermittently functioning wound VAC.  It is currently working appropriately with good suction on abdominal wound.  It is intermittently draining serosanguineous and slightly purulent fluid.  The patient has no associated abdominal pain.  No fevers.   She is scheduled for follow-up with the transplant clinic tomorrow, Monday.  I do not see need for further emergent work-up at this time.  Return precautions discussed and provided. Patient discharged in stable condition with no unaddressed concerns.   Final Clinical Impressions(s) / ED Diagnoses   Final diagnoses:  Encounter for postoperative wound check    ED Discharge Orders    None       Antonietta Breach, PA-C 01/18/18 0440    Shanon Rosser, MD 01/18/18 [phone]

## 2018-01-18 NOTE — ED Notes (Addendum)
Pt called her transplant nurse at [facility] regarding her wound vac. Pt states that it beeps for an alert and then posts a message stating "blockage." The nurse advised the pt to come to an emergency department to make sure. The wound vac is currently working without any alerts or issues at this time.

## 2018-01-18 NOTE — Discharge Instructions (Addendum)
We recommend that you be seen at [facility] for any persistent issues with your wound VAC.  Return to this ED for new or concerning symptoms.

## 2018-01-18 NOTE — ED Triage Notes (Signed)
Pt states she has been advised to come in to have her wound vac trouble shooting. States it is blocked or keeps getting an alarm for blockage. She reports that's the wound vac is part of the hearing treatment post kidney transplant.

## 2018-01-19 DIAGNOSIS — E872 Acidosis: Secondary | ICD-10-CM | POA: Diagnosis not present

## 2018-01-19 DIAGNOSIS — Z79899 Other long term (current) drug therapy: Secondary | ICD-10-CM | POA: Diagnosis not present

## 2018-01-19 DIAGNOSIS — Z4822 Encounter for aftercare following kidney transplant: Secondary | ICD-10-CM | POA: Diagnosis not present

## 2018-01-19 DIAGNOSIS — I10 Essential (primary) hypertension: Secondary | ICD-10-CM | POA: Diagnosis not present

## 2018-01-19 DIAGNOSIS — Z7952 Long term (current) use of systemic steroids: Secondary | ICD-10-CM | POA: Diagnosis not present

## 2018-01-19 DIAGNOSIS — Z792 Long term (current) use of antibiotics: Secondary | ICD-10-CM | POA: Diagnosis not present

## 2018-01-19 DIAGNOSIS — Z940 Kidney transplant status: Secondary | ICD-10-CM | POA: Diagnosis not present

## 2018-01-19 DIAGNOSIS — B9561 Methicillin susceptible Staphylococcus aureus infection as the cause of diseases classified elsewhere: Secondary | ICD-10-CM | POA: Diagnosis not present

## 2018-01-19 DIAGNOSIS — T8143XD Infection following a procedure, organ and space surgical site, subsequent encounter: Secondary | ICD-10-CM | POA: Diagnosis not present

## 2018-01-19 DIAGNOSIS — K6811 Postprocedural retroperitoneal abscess: Secondary | ICD-10-CM | POA: Diagnosis not present

## 2018-01-19 DIAGNOSIS — Z466 Encounter for fitting and adjustment of urinary device: Secondary | ICD-10-CM | POA: Diagnosis not present

## 2018-01-19 DIAGNOSIS — T8143XA Infection following a procedure, organ and space surgical site, initial encounter: Secondary | ICD-10-CM | POA: Diagnosis not present

## 2018-01-19 DIAGNOSIS — D649 Anemia, unspecified: Secondary | ICD-10-CM | POA: Diagnosis not present

## 2018-01-19 DIAGNOSIS — I951 Orthostatic hypotension: Secondary | ICD-10-CM | POA: Diagnosis not present

## 2018-01-20 DIAGNOSIS — I129 Hypertensive chronic kidney disease with stage 1 through stage 4 chronic kidney disease, or unspecified chronic kidney disease: Secondary | ICD-10-CM | POA: Diagnosis not present

## 2018-01-20 DIAGNOSIS — L02211 Cutaneous abscess of abdominal wall: Secondary | ICD-10-CM | POA: Diagnosis not present

## 2018-01-20 DIAGNOSIS — T8141XA Infection following a procedure, superficial incisional surgical site, initial encounter: Secondary | ICD-10-CM | POA: Diagnosis not present

## 2018-01-20 DIAGNOSIS — D631 Anemia in chronic kidney disease: Secondary | ICD-10-CM | POA: Diagnosis not present

## 2018-01-20 DIAGNOSIS — K279 Peptic ulcer, site unspecified, unspecified as acute or chronic, without hemorrhage or perforation: Secondary | ICD-10-CM | POA: Diagnosis not present

## 2018-01-20 DIAGNOSIS — N189 Chronic kidney disease, unspecified: Secondary | ICD-10-CM | POA: Diagnosis not present

## 2018-01-23 DIAGNOSIS — K279 Peptic ulcer, site unspecified, unspecified as acute or chronic, without hemorrhage or perforation: Secondary | ICD-10-CM | POA: Diagnosis not present

## 2018-01-23 DIAGNOSIS — D631 Anemia in chronic kidney disease: Secondary | ICD-10-CM | POA: Diagnosis not present

## 2018-01-23 DIAGNOSIS — N189 Chronic kidney disease, unspecified: Secondary | ICD-10-CM | POA: Diagnosis not present

## 2018-01-23 DIAGNOSIS — T8141XA Infection following a procedure, superficial incisional surgical site, initial encounter: Secondary | ICD-10-CM | POA: Diagnosis not present

## 2018-01-23 DIAGNOSIS — L02211 Cutaneous abscess of abdominal wall: Secondary | ICD-10-CM | POA: Diagnosis not present

## 2018-01-23 DIAGNOSIS — I129 Hypertensive chronic kidney disease with stage 1 through stage 4 chronic kidney disease, or unspecified chronic kidney disease: Secondary | ICD-10-CM | POA: Diagnosis not present

## 2018-01-27 DIAGNOSIS — D631 Anemia in chronic kidney disease: Secondary | ICD-10-CM | POA: Diagnosis present

## 2018-01-27 DIAGNOSIS — N186 End stage renal disease: Secondary | ICD-10-CM | POA: Diagnosis present

## 2018-01-27 DIAGNOSIS — Z841 Family history of disorders of kidney and ureter: Secondary | ICD-10-CM | POA: Diagnosis not present

## 2018-01-27 DIAGNOSIS — Z992 Dependence on renal dialysis: Secondary | ICD-10-CM | POA: Diagnosis not present

## 2018-01-27 DIAGNOSIS — R509 Fever, unspecified: Secondary | ICD-10-CM | POA: Diagnosis not present

## 2018-01-27 DIAGNOSIS — Z940 Kidney transplant status: Secondary | ICD-10-CM | POA: Diagnosis not present

## 2018-01-27 DIAGNOSIS — Z5181 Encounter for therapeutic drug level monitoring: Secondary | ICD-10-CM | POA: Diagnosis not present

## 2018-01-27 DIAGNOSIS — K219 Gastro-esophageal reflux disease without esophagitis: Secondary | ICD-10-CM | POA: Diagnosis present

## 2018-01-27 DIAGNOSIS — D72819 Decreased white blood cell count, unspecified: Secondary | ICD-10-CM | POA: Diagnosis present

## 2018-01-27 DIAGNOSIS — I129 Hypertensive chronic kidney disease with stage 1 through stage 4 chronic kidney disease, or unspecified chronic kidney disease: Secondary | ICD-10-CM | POA: Diagnosis not present

## 2018-01-27 DIAGNOSIS — T8149XA Infection following a procedure, other surgical site, initial encounter: Secondary | ICD-10-CM | POA: Diagnosis not present

## 2018-01-27 DIAGNOSIS — L02211 Cutaneous abscess of abdominal wall: Secondary | ICD-10-CM | POA: Diagnosis not present

## 2018-01-27 DIAGNOSIS — N189 Chronic kidney disease, unspecified: Secondary | ICD-10-CM | POA: Diagnosis not present

## 2018-01-27 DIAGNOSIS — Z8249 Family history of ischemic heart disease and other diseases of the circulatory system: Secondary | ICD-10-CM | POA: Diagnosis not present

## 2018-01-27 DIAGNOSIS — B259 Cytomegaloviral disease, unspecified: Secondary | ICD-10-CM | POA: Diagnosis present

## 2018-01-27 DIAGNOSIS — I120 Hypertensive chronic kidney disease with stage 5 chronic kidney disease or end stage renal disease: Secondary | ICD-10-CM | POA: Diagnosis present

## 2018-01-27 DIAGNOSIS — E1122 Type 2 diabetes mellitus with diabetic chronic kidney disease: Secondary | ICD-10-CM | POA: Diagnosis present

## 2018-01-27 DIAGNOSIS — E876 Hypokalemia: Secondary | ICD-10-CM | POA: Diagnosis present

## 2018-01-27 DIAGNOSIS — D509 Iron deficiency anemia, unspecified: Secondary | ICD-10-CM | POA: Diagnosis present

## 2018-01-27 DIAGNOSIS — K279 Peptic ulcer, site unspecified, unspecified as acute or chronic, without hemorrhage or perforation: Secondary | ICD-10-CM | POA: Diagnosis not present

## 2018-01-27 DIAGNOSIS — Z79899 Other long term (current) drug therapy: Secondary | ICD-10-CM | POA: Diagnosis not present

## 2018-01-27 DIAGNOSIS — T8141XA Infection following a procedure, superficial incisional surgical site, initial encounter: Secondary | ICD-10-CM | POA: Diagnosis not present

## 2018-01-27 DIAGNOSIS — K311 Adult hypertrophic pyloric stenosis: Secondary | ICD-10-CM | POA: Diagnosis present

## 2018-01-27 DIAGNOSIS — T8611 Kidney transplant rejection: Secondary | ICD-10-CM | POA: Diagnosis present

## 2018-01-28 DIAGNOSIS — N189 Chronic kidney disease, unspecified: Secondary | ICD-10-CM | POA: Diagnosis not present

## 2018-01-28 DIAGNOSIS — I129 Hypertensive chronic kidney disease with stage 1 through stage 4 chronic kidney disease, or unspecified chronic kidney disease: Secondary | ICD-10-CM | POA: Diagnosis not present

## 2018-01-28 DIAGNOSIS — L02211 Cutaneous abscess of abdominal wall: Secondary | ICD-10-CM | POA: Diagnosis not present

## 2018-01-28 DIAGNOSIS — K279 Peptic ulcer, site unspecified, unspecified as acute or chronic, without hemorrhage or perforation: Secondary | ICD-10-CM | POA: Diagnosis not present

## 2018-01-28 DIAGNOSIS — T8141XA Infection following a procedure, superficial incisional surgical site, initial encounter: Secondary | ICD-10-CM | POA: Diagnosis not present

## 2018-01-28 DIAGNOSIS — D631 Anemia in chronic kidney disease: Secondary | ICD-10-CM | POA: Diagnosis not present

## 2018-01-30 DIAGNOSIS — Z7952 Long term (current) use of systemic steroids: Secondary | ICD-10-CM | POA: Diagnosis not present

## 2018-01-30 DIAGNOSIS — N2889 Other specified disorders of kidney and ureter: Secondary | ICD-10-CM | POA: Diagnosis not present

## 2018-01-30 DIAGNOSIS — T8149XD Infection following a procedure, other surgical site, subsequent encounter: Secondary | ICD-10-CM | POA: Diagnosis not present

## 2018-01-30 DIAGNOSIS — Z792 Long term (current) use of antibiotics: Secondary | ICD-10-CM | POA: Diagnosis not present

## 2018-01-30 DIAGNOSIS — N189 Chronic kidney disease, unspecified: Secondary | ICD-10-CM | POA: Diagnosis not present

## 2018-01-30 DIAGNOSIS — D72819 Decreased white blood cell count, unspecified: Secondary | ICD-10-CM | POA: Diagnosis not present

## 2018-01-30 DIAGNOSIS — T8149XA Infection following a procedure, other surgical site, initial encounter: Secondary | ICD-10-CM | POA: Diagnosis not present

## 2018-01-30 DIAGNOSIS — Z4822 Encounter for aftercare following kidney transplant: Secondary | ICD-10-CM | POA: Diagnosis not present

## 2018-01-30 DIAGNOSIS — Z940 Kidney transplant status: Secondary | ICD-10-CM | POA: Diagnosis not present

## 2018-01-30 DIAGNOSIS — I10 Essential (primary) hypertension: Secondary | ICD-10-CM | POA: Diagnosis not present

## 2018-01-30 DIAGNOSIS — D631 Anemia in chronic kidney disease: Secondary | ICD-10-CM | POA: Diagnosis not present

## 2018-01-30 DIAGNOSIS — I151 Hypertension secondary to other renal disorders: Secondary | ICD-10-CM | POA: Diagnosis not present

## 2018-01-30 DIAGNOSIS — D649 Anemia, unspecified: Secondary | ICD-10-CM | POA: Diagnosis not present

## 2018-01-30 DIAGNOSIS — Z7982 Long term (current) use of aspirin: Secondary | ICD-10-CM | POA: Diagnosis not present

## 2018-01-30 DIAGNOSIS — E872 Acidosis: Secondary | ICD-10-CM | POA: Diagnosis not present

## 2018-01-30 DIAGNOSIS — Z79899 Other long term (current) drug therapy: Secondary | ICD-10-CM | POA: Diagnosis not present

## 2018-01-30 DIAGNOSIS — E212 Other hyperparathyroidism: Secondary | ICD-10-CM | POA: Diagnosis not present

## 2018-02-02 DIAGNOSIS — T8131XD Disruption of external operation (surgical) wound, not elsewhere classified, subsequent encounter: Secondary | ICD-10-CM | POA: Diagnosis not present

## 2018-02-02 DIAGNOSIS — Z792 Long term (current) use of antibiotics: Secondary | ICD-10-CM | POA: Diagnosis not present

## 2018-02-02 DIAGNOSIS — Z4822 Encounter for aftercare following kidney transplant: Secondary | ICD-10-CM | POA: Diagnosis not present

## 2018-02-02 DIAGNOSIS — Z940 Kidney transplant status: Secondary | ICD-10-CM | POA: Diagnosis not present

## 2018-02-02 DIAGNOSIS — D72819 Decreased white blood cell count, unspecified: Secondary | ICD-10-CM | POA: Diagnosis not present

## 2018-02-02 DIAGNOSIS — T8140XA Infection following a procedure, unspecified, initial encounter: Secondary | ICD-10-CM | POA: Diagnosis not present

## 2018-02-02 DIAGNOSIS — Z79899 Other long term (current) drug therapy: Secondary | ICD-10-CM | POA: Diagnosis not present

## 2018-02-02 DIAGNOSIS — Z7983 Long term (current) use of bisphosphonates: Secondary | ICD-10-CM | POA: Diagnosis not present

## 2018-02-02 DIAGNOSIS — Z7952 Long term (current) use of systemic steroids: Secondary | ICD-10-CM | POA: Diagnosis not present

## 2018-02-02 DIAGNOSIS — E871 Hypo-osmolality and hyponatremia: Secondary | ICD-10-CM | POA: Diagnosis not present

## 2018-02-02 DIAGNOSIS — D8989 Other specified disorders involving the immune mechanism, not elsewhere classified: Secondary | ICD-10-CM | POA: Diagnosis not present

## 2018-02-02 DIAGNOSIS — E875 Hyperkalemia: Secondary | ICD-10-CM | POA: Diagnosis not present

## 2018-02-02 DIAGNOSIS — D649 Anemia, unspecified: Secondary | ICD-10-CM | POA: Diagnosis not present

## 2018-02-02 DIAGNOSIS — E872 Acidosis: Secondary | ICD-10-CM | POA: Diagnosis not present

## 2018-02-02 DIAGNOSIS — I951 Orthostatic hypotension: Secondary | ICD-10-CM | POA: Diagnosis not present

## 2018-02-02 DIAGNOSIS — T8611 Kidney transplant rejection: Secondary | ICD-10-CM | POA: Diagnosis not present

## 2018-02-04 DIAGNOSIS — Z5181 Encounter for therapeutic drug level monitoring: Secondary | ICD-10-CM | POA: Diagnosis not present

## 2018-02-04 DIAGNOSIS — Z79899 Other long term (current) drug therapy: Secondary | ICD-10-CM | POA: Diagnosis not present

## 2018-02-04 DIAGNOSIS — Z940 Kidney transplant status: Secondary | ICD-10-CM | POA: Diagnosis not present

## 2018-02-09 DIAGNOSIS — T8619 Other complication of kidney transplant: Secondary | ICD-10-CM | POA: Diagnosis not present

## 2018-02-09 DIAGNOSIS — Z7952 Long term (current) use of systemic steroids: Secondary | ICD-10-CM | POA: Diagnosis not present

## 2018-02-09 DIAGNOSIS — Z940 Kidney transplant status: Secondary | ICD-10-CM | POA: Diagnosis not present

## 2018-02-09 DIAGNOSIS — D649 Anemia, unspecified: Secondary | ICD-10-CM | POA: Diagnosis not present

## 2018-02-09 DIAGNOSIS — Z4822 Encounter for aftercare following kidney transplant: Secondary | ICD-10-CM | POA: Diagnosis not present

## 2018-02-09 DIAGNOSIS — I10 Essential (primary) hypertension: Secondary | ICD-10-CM | POA: Diagnosis not present

## 2018-02-09 DIAGNOSIS — L089 Local infection of the skin and subcutaneous tissue, unspecified: Secondary | ICD-10-CM | POA: Diagnosis not present

## 2018-02-09 DIAGNOSIS — I951 Orthostatic hypotension: Secondary | ICD-10-CM | POA: Diagnosis not present

## 2018-02-09 DIAGNOSIS — Z792 Long term (current) use of antibiotics: Secondary | ICD-10-CM | POA: Diagnosis not present

## 2018-02-09 DIAGNOSIS — D72819 Decreased white blood cell count, unspecified: Secondary | ICD-10-CM | POA: Diagnosis not present

## 2018-02-09 DIAGNOSIS — E872 Acidosis: Secondary | ICD-10-CM | POA: Diagnosis not present

## 2018-02-09 DIAGNOSIS — Z9489 Other transplanted organ and tissue status: Secondary | ICD-10-CM | POA: Diagnosis not present

## 2018-02-09 DIAGNOSIS — Z8614 Personal history of Methicillin resistant Staphylococcus aureus infection: Secondary | ICD-10-CM | POA: Diagnosis not present

## 2018-02-09 DIAGNOSIS — Z79899 Other long term (current) drug therapy: Secondary | ICD-10-CM | POA: Diagnosis not present

## 2018-02-12 DIAGNOSIS — Z79899 Other long term (current) drug therapy: Secondary | ICD-10-CM | POA: Diagnosis not present

## 2018-02-12 DIAGNOSIS — I10 Essential (primary) hypertension: Secondary | ICD-10-CM | POA: Diagnosis not present

## 2018-02-12 DIAGNOSIS — T8611 Kidney transplant rejection: Secondary | ICD-10-CM | POA: Diagnosis not present

## 2018-02-12 DIAGNOSIS — N051 Unspecified nephritic syndrome with focal and segmental glomerular lesions: Secondary | ICD-10-CM | POA: Diagnosis not present

## 2018-02-12 DIAGNOSIS — D631 Anemia in chronic kidney disease: Secondary | ICD-10-CM | POA: Diagnosis not present

## 2018-02-12 DIAGNOSIS — E872 Acidosis: Secondary | ICD-10-CM | POA: Diagnosis not present

## 2018-02-12 DIAGNOSIS — Z7952 Long term (current) use of systemic steroids: Secondary | ICD-10-CM | POA: Diagnosis not present

## 2018-02-12 DIAGNOSIS — Z5181 Encounter for therapeutic drug level monitoring: Secondary | ICD-10-CM | POA: Diagnosis not present

## 2018-02-12 DIAGNOSIS — R7989 Other specified abnormal findings of blood chemistry: Secondary | ICD-10-CM | POA: Diagnosis not present

## 2018-02-12 DIAGNOSIS — N261 Atrophy of kidney (terminal): Secondary | ICD-10-CM | POA: Diagnosis not present

## 2018-02-12 DIAGNOSIS — N269 Renal sclerosis, unspecified: Secondary | ICD-10-CM | POA: Diagnosis not present

## 2018-02-12 DIAGNOSIS — I120 Hypertensive chronic kidney disease with stage 5 chronic kidney disease or end stage renal disease: Secondary | ICD-10-CM | POA: Diagnosis not present

## 2018-02-12 DIAGNOSIS — Z01818 Encounter for other preprocedural examination: Secondary | ICD-10-CM | POA: Diagnosis not present

## 2018-02-12 DIAGNOSIS — N186 End stage renal disease: Secondary | ICD-10-CM | POA: Diagnosis not present

## 2018-02-12 DIAGNOSIS — Z4822 Encounter for aftercare following kidney transplant: Secondary | ICD-10-CM | POA: Diagnosis not present

## 2018-02-12 DIAGNOSIS — Z940 Kidney transplant status: Secondary | ICD-10-CM | POA: Diagnosis not present

## 2018-02-16 DIAGNOSIS — D899 Disorder involving the immune mechanism, unspecified: Secondary | ICD-10-CM | POA: Diagnosis not present

## 2018-02-16 DIAGNOSIS — T471X5A Adverse effect of other antacids and anti-gastric-secretion drugs, initial encounter: Secondary | ICD-10-CM | POA: Diagnosis not present

## 2018-02-16 DIAGNOSIS — Z79899 Other long term (current) drug therapy: Secondary | ICD-10-CM | POA: Diagnosis not present

## 2018-02-16 DIAGNOSIS — I10 Essential (primary) hypertension: Secondary | ICD-10-CM | POA: Diagnosis not present

## 2018-02-16 DIAGNOSIS — Z5181 Encounter for therapeutic drug level monitoring: Secondary | ICD-10-CM | POA: Diagnosis not present

## 2018-02-16 DIAGNOSIS — E212 Other hyperparathyroidism: Secondary | ICD-10-CM | POA: Diagnosis not present

## 2018-02-16 DIAGNOSIS — Z7982 Long term (current) use of aspirin: Secondary | ICD-10-CM | POA: Diagnosis not present

## 2018-02-16 DIAGNOSIS — B259 Cytomegaloviral disease, unspecified: Secondary | ICD-10-CM | POA: Diagnosis not present

## 2018-02-16 DIAGNOSIS — Z940 Kidney transplant status: Secondary | ICD-10-CM | POA: Diagnosis not present

## 2018-02-16 DIAGNOSIS — I951 Orthostatic hypotension: Secondary | ICD-10-CM | POA: Diagnosis not present

## 2018-02-16 DIAGNOSIS — Z792 Long term (current) use of antibiotics: Secondary | ICD-10-CM | POA: Diagnosis not present

## 2018-02-16 DIAGNOSIS — E872 Acidosis: Secondary | ICD-10-CM | POA: Diagnosis not present

## 2018-02-16 DIAGNOSIS — Z7952 Long term (current) use of systemic steroids: Secondary | ICD-10-CM | POA: Diagnosis not present

## 2018-02-16 DIAGNOSIS — D649 Anemia, unspecified: Secondary | ICD-10-CM | POA: Diagnosis not present

## 2018-02-16 DIAGNOSIS — K521 Toxic gastroenteritis and colitis: Secondary | ICD-10-CM | POA: Diagnosis not present

## 2018-02-17 DIAGNOSIS — Z940 Kidney transplant status: Secondary | ICD-10-CM | POA: Diagnosis not present

## 2018-02-18 DIAGNOSIS — Z4822 Encounter for aftercare following kidney transplant: Secondary | ICD-10-CM | POA: Diagnosis not present

## 2018-02-24 DIAGNOSIS — B259 Cytomegaloviral disease, unspecified: Secondary | ICD-10-CM | POA: Diagnosis not present

## 2018-02-24 DIAGNOSIS — E212 Other hyperparathyroidism: Secondary | ICD-10-CM | POA: Diagnosis not present

## 2018-02-24 DIAGNOSIS — E872 Acidosis: Secondary | ICD-10-CM | POA: Diagnosis not present

## 2018-02-24 DIAGNOSIS — T8619 Other complication of kidney transplant: Secondary | ICD-10-CM | POA: Diagnosis not present

## 2018-02-24 DIAGNOSIS — Z79899 Other long term (current) drug therapy: Secondary | ICD-10-CM | POA: Diagnosis not present

## 2018-02-24 DIAGNOSIS — D72819 Decreased white blood cell count, unspecified: Secondary | ICD-10-CM | POA: Diagnosis not present

## 2018-02-24 DIAGNOSIS — D649 Anemia, unspecified: Secondary | ICD-10-CM | POA: Diagnosis not present

## 2018-02-24 DIAGNOSIS — Z940 Kidney transplant status: Secondary | ICD-10-CM | POA: Diagnosis not present

## 2018-02-24 DIAGNOSIS — I10 Essential (primary) hypertension: Secondary | ICD-10-CM | POA: Diagnosis not present

## 2018-02-24 DIAGNOSIS — I951 Orthostatic hypotension: Secondary | ICD-10-CM | POA: Diagnosis not present

## 2018-02-24 DIAGNOSIS — T8149XD Infection following a procedure, other surgical site, subsequent encounter: Secondary | ICD-10-CM | POA: Diagnosis not present

## 2018-02-24 DIAGNOSIS — D8989 Other specified disorders involving the immune mechanism, not elsewhere classified: Secondary | ICD-10-CM | POA: Diagnosis not present

## 2018-02-24 DIAGNOSIS — Z7952 Long term (current) use of systemic steroids: Secondary | ICD-10-CM | POA: Diagnosis not present

## 2018-02-24 DIAGNOSIS — Z4822 Encounter for aftercare following kidney transplant: Secondary | ICD-10-CM | POA: Diagnosis not present

## 2018-03-03 DIAGNOSIS — E872 Acidosis: Secondary | ICD-10-CM | POA: Diagnosis not present

## 2018-03-03 DIAGNOSIS — Z9489 Other transplanted organ and tissue status: Secondary | ICD-10-CM | POA: Diagnosis not present

## 2018-03-03 DIAGNOSIS — Z4822 Encounter for aftercare following kidney transplant: Secondary | ICD-10-CM | POA: Diagnosis not present

## 2018-03-03 DIAGNOSIS — Z7952 Long term (current) use of systemic steroids: Secondary | ICD-10-CM | POA: Diagnosis not present

## 2018-03-03 DIAGNOSIS — Z792 Long term (current) use of antibiotics: Secondary | ICD-10-CM | POA: Diagnosis not present

## 2018-03-03 DIAGNOSIS — B258 Other cytomegaloviral diseases: Secondary | ICD-10-CM | POA: Diagnosis not present

## 2018-03-03 DIAGNOSIS — Z79899 Other long term (current) drug therapy: Secondary | ICD-10-CM | POA: Diagnosis not present

## 2018-03-03 DIAGNOSIS — I10 Essential (primary) hypertension: Secondary | ICD-10-CM | POA: Diagnosis not present

## 2018-03-03 DIAGNOSIS — D8989 Other specified disorders involving the immune mechanism, not elsewhere classified: Secondary | ICD-10-CM | POA: Diagnosis not present

## 2018-03-03 DIAGNOSIS — T8619 Other complication of kidney transplant: Secondary | ICD-10-CM | POA: Diagnosis not present

## 2018-03-03 DIAGNOSIS — D649 Anemia, unspecified: Secondary | ICD-10-CM | POA: Diagnosis not present

## 2018-03-03 DIAGNOSIS — Z7982 Long term (current) use of aspirin: Secondary | ICD-10-CM | POA: Diagnosis not present

## 2018-03-03 DIAGNOSIS — Z8614 Personal history of Methicillin resistant Staphylococcus aureus infection: Secondary | ICD-10-CM | POA: Diagnosis not present

## 2018-03-03 DIAGNOSIS — D72819 Decreased white blood cell count, unspecified: Secondary | ICD-10-CM | POA: Diagnosis not present

## 2018-03-06 DIAGNOSIS — Z5181 Encounter for therapeutic drug level monitoring: Secondary | ICD-10-CM | POA: Diagnosis not present

## 2018-03-06 DIAGNOSIS — Z79899 Other long term (current) drug therapy: Secondary | ICD-10-CM | POA: Diagnosis not present

## 2018-03-06 DIAGNOSIS — Z940 Kidney transplant status: Secondary | ICD-10-CM | POA: Diagnosis not present

## 2018-03-10 DIAGNOSIS — Z940 Kidney transplant status: Secondary | ICD-10-CM | POA: Diagnosis not present

## 2018-03-10 DIAGNOSIS — Z5181 Encounter for therapeutic drug level monitoring: Secondary | ICD-10-CM | POA: Diagnosis not present

## 2018-03-10 DIAGNOSIS — Z79899 Other long term (current) drug therapy: Secondary | ICD-10-CM | POA: Diagnosis not present

## 2018-03-17 DIAGNOSIS — I10 Essential (primary) hypertension: Secondary | ICD-10-CM | POA: Diagnosis not present

## 2018-03-17 DIAGNOSIS — Z7952 Long term (current) use of systemic steroids: Secondary | ICD-10-CM | POA: Diagnosis not present

## 2018-03-17 DIAGNOSIS — T8611 Kidney transplant rejection: Secondary | ICD-10-CM | POA: Diagnosis not present

## 2018-03-17 DIAGNOSIS — Z4822 Encounter for aftercare following kidney transplant: Secondary | ICD-10-CM | POA: Diagnosis not present

## 2018-03-17 DIAGNOSIS — D649 Anemia, unspecified: Secondary | ICD-10-CM | POA: Diagnosis not present

## 2018-03-17 DIAGNOSIS — E872 Acidosis: Secondary | ICD-10-CM | POA: Diagnosis not present

## 2018-03-17 DIAGNOSIS — Z792 Long term (current) use of antibiotics: Secondary | ICD-10-CM | POA: Diagnosis not present

## 2018-03-17 DIAGNOSIS — E871 Hypo-osmolality and hyponatremia: Secondary | ICD-10-CM | POA: Diagnosis not present

## 2018-03-17 DIAGNOSIS — D72819 Decreased white blood cell count, unspecified: Secondary | ICD-10-CM | POA: Diagnosis not present

## 2018-03-17 DIAGNOSIS — D8989 Other specified disorders involving the immune mechanism, not elsewhere classified: Secondary | ICD-10-CM | POA: Diagnosis not present

## 2018-03-17 DIAGNOSIS — B259 Cytomegaloviral disease, unspecified: Secondary | ICD-10-CM | POA: Diagnosis not present

## 2018-03-17 DIAGNOSIS — Z940 Kidney transplant status: Secondary | ICD-10-CM | POA: Diagnosis not present

## 2018-03-17 DIAGNOSIS — Z7983 Long term (current) use of bisphosphonates: Secondary | ICD-10-CM | POA: Diagnosis not present

## 2018-03-17 DIAGNOSIS — Z23 Encounter for immunization: Secondary | ICD-10-CM | POA: Diagnosis not present

## 2018-03-17 DIAGNOSIS — Z79899 Other long term (current) drug therapy: Secondary | ICD-10-CM | POA: Diagnosis not present

## 2018-03-17 DIAGNOSIS — Z7982 Long term (current) use of aspirin: Secondary | ICD-10-CM | POA: Diagnosis not present

## 2018-03-31 DIAGNOSIS — B9789 Other viral agents as the cause of diseases classified elsewhere: Secondary | ICD-10-CM | POA: Diagnosis not present

## 2018-03-31 DIAGNOSIS — Z79899 Other long term (current) drug therapy: Secondary | ICD-10-CM | POA: Diagnosis not present

## 2018-03-31 DIAGNOSIS — K219 Gastro-esophageal reflux disease without esophagitis: Secondary | ICD-10-CM | POA: Diagnosis present

## 2018-03-31 DIAGNOSIS — Z940 Kidney transplant status: Secondary | ICD-10-CM | POA: Diagnosis not present

## 2018-03-31 DIAGNOSIS — Z5181 Encounter for therapeutic drug level monitoring: Secondary | ICD-10-CM | POA: Diagnosis not present

## 2018-03-31 DIAGNOSIS — R634 Abnormal weight loss: Secondary | ICD-10-CM | POA: Diagnosis not present

## 2018-03-31 DIAGNOSIS — N179 Acute kidney failure, unspecified: Secondary | ICD-10-CM | POA: Diagnosis not present

## 2018-03-31 DIAGNOSIS — E860 Dehydration: Secondary | ICD-10-CM | POA: Diagnosis present

## 2018-03-31 DIAGNOSIS — D649 Anemia, unspecified: Secondary | ICD-10-CM | POA: Diagnosis not present

## 2018-03-31 DIAGNOSIS — Z4822 Encounter for aftercare following kidney transplant: Secondary | ICD-10-CM | POA: Diagnosis not present

## 2018-03-31 DIAGNOSIS — D72819 Decreased white blood cell count, unspecified: Secondary | ICD-10-CM | POA: Diagnosis not present

## 2018-03-31 DIAGNOSIS — Z7982 Long term (current) use of aspirin: Secondary | ICD-10-CM | POA: Diagnosis not present

## 2018-03-31 DIAGNOSIS — I959 Hypotension, unspecified: Secondary | ICD-10-CM | POA: Diagnosis not present

## 2018-03-31 DIAGNOSIS — T8619 Other complication of kidney transplant: Secondary | ICD-10-CM | POA: Diagnosis not present

## 2018-03-31 DIAGNOSIS — E872 Acidosis: Secondary | ICD-10-CM | POA: Diagnosis present

## 2018-03-31 DIAGNOSIS — I701 Atherosclerosis of renal artery: Secondary | ICD-10-CM | POA: Diagnosis not present

## 2018-03-31 DIAGNOSIS — R630 Anorexia: Secondary | ICD-10-CM | POA: Diagnosis not present

## 2018-03-31 DIAGNOSIS — J069 Acute upper respiratory infection, unspecified: Secondary | ICD-10-CM | POA: Diagnosis not present

## 2018-04-08 DIAGNOSIS — Z7952 Long term (current) use of systemic steroids: Secondary | ICD-10-CM | POA: Diagnosis not present

## 2018-04-08 DIAGNOSIS — I10 Essential (primary) hypertension: Secondary | ICD-10-CM | POA: Diagnosis not present

## 2018-04-08 DIAGNOSIS — Z4822 Encounter for aftercare following kidney transplant: Secondary | ICD-10-CM | POA: Diagnosis not present

## 2018-04-08 DIAGNOSIS — N179 Acute kidney failure, unspecified: Secondary | ICD-10-CM | POA: Diagnosis not present

## 2018-04-08 DIAGNOSIS — Z940 Kidney transplant status: Secondary | ICD-10-CM | POA: Diagnosis not present

## 2018-04-08 DIAGNOSIS — Z79899 Other long term (current) drug therapy: Secondary | ICD-10-CM | POA: Diagnosis not present

## 2018-04-08 DIAGNOSIS — Z792 Long term (current) use of antibiotics: Secondary | ICD-10-CM | POA: Diagnosis not present

## 2018-04-08 DIAGNOSIS — Z5181 Encounter for therapeutic drug level monitoring: Secondary | ICD-10-CM | POA: Diagnosis not present

## 2018-04-08 DIAGNOSIS — E212 Other hyperparathyroidism: Secondary | ICD-10-CM | POA: Diagnosis not present

## 2018-04-08 DIAGNOSIS — D72819 Decreased white blood cell count, unspecified: Secondary | ICD-10-CM | POA: Diagnosis not present

## 2018-04-08 DIAGNOSIS — E872 Acidosis: Secondary | ICD-10-CM | POA: Diagnosis not present

## 2018-04-08 DIAGNOSIS — B259 Cytomegaloviral disease, unspecified: Secondary | ICD-10-CM | POA: Diagnosis not present

## 2018-04-08 DIAGNOSIS — D649 Anemia, unspecified: Secondary | ICD-10-CM | POA: Diagnosis not present

## 2018-04-16 DIAGNOSIS — Z940 Kidney transplant status: Secondary | ICD-10-CM | POA: Diagnosis not present

## 2018-04-16 DIAGNOSIS — N189 Chronic kidney disease, unspecified: Secondary | ICD-10-CM | POA: Diagnosis not present

## 2018-04-16 DIAGNOSIS — Z792 Long term (current) use of antibiotics: Secondary | ICD-10-CM | POA: Diagnosis not present

## 2018-04-16 DIAGNOSIS — Z79899 Other long term (current) drug therapy: Secondary | ICD-10-CM | POA: Diagnosis not present

## 2018-04-16 DIAGNOSIS — B259 Cytomegaloviral disease, unspecified: Secondary | ICD-10-CM | POA: Diagnosis not present

## 2018-04-16 DIAGNOSIS — Z5181 Encounter for therapeutic drug level monitoring: Secondary | ICD-10-CM | POA: Diagnosis not present

## 2018-04-16 DIAGNOSIS — D649 Anemia, unspecified: Secondary | ICD-10-CM | POA: Diagnosis not present

## 2018-04-16 DIAGNOSIS — D631 Anemia in chronic kidney disease: Secondary | ICD-10-CM | POA: Diagnosis not present

## 2018-04-16 DIAGNOSIS — N179 Acute kidney failure, unspecified: Secondary | ICD-10-CM | POA: Diagnosis not present

## 2018-04-16 DIAGNOSIS — E212 Other hyperparathyroidism: Secondary | ICD-10-CM | POA: Diagnosis not present

## 2018-04-16 DIAGNOSIS — Z4822 Encounter for aftercare following kidney transplant: Secondary | ICD-10-CM | POA: Diagnosis not present

## 2018-04-16 DIAGNOSIS — Z7952 Long term (current) use of systemic steroids: Secondary | ICD-10-CM | POA: Diagnosis not present

## 2018-04-16 DIAGNOSIS — D702 Other drug-induced agranulocytosis: Secondary | ICD-10-CM | POA: Diagnosis not present

## 2018-04-16 DIAGNOSIS — T451X5A Adverse effect of antineoplastic and immunosuppressive drugs, initial encounter: Secondary | ICD-10-CM | POA: Diagnosis not present

## 2018-04-23 DIAGNOSIS — D708 Other neutropenia: Secondary | ICD-10-CM | POA: Diagnosis not present

## 2018-04-23 DIAGNOSIS — Z4822 Encounter for aftercare following kidney transplant: Secondary | ICD-10-CM | POA: Diagnosis not present

## 2018-04-23 DIAGNOSIS — Z79899 Other long term (current) drug therapy: Secondary | ICD-10-CM | POA: Diagnosis not present

## 2018-04-23 DIAGNOSIS — E872 Acidosis: Secondary | ICD-10-CM | POA: Diagnosis not present

## 2018-04-23 DIAGNOSIS — I10 Essential (primary) hypertension: Secondary | ICD-10-CM | POA: Diagnosis not present

## 2018-05-07 DIAGNOSIS — D702 Other drug-induced agranulocytosis: Secondary | ICD-10-CM | POA: Diagnosis not present

## 2018-05-07 DIAGNOSIS — B349 Viral infection, unspecified: Secondary | ICD-10-CM | POA: Diagnosis not present

## 2018-05-07 DIAGNOSIS — I151 Hypertension secondary to other renal disorders: Secondary | ICD-10-CM | POA: Diagnosis not present

## 2018-05-07 DIAGNOSIS — T451X5A Adverse effect of antineoplastic and immunosuppressive drugs, initial encounter: Secondary | ICD-10-CM | POA: Diagnosis not present

## 2018-05-07 DIAGNOSIS — Z79899 Other long term (current) drug therapy: Secondary | ICD-10-CM | POA: Diagnosis not present

## 2018-05-07 DIAGNOSIS — N189 Chronic kidney disease, unspecified: Secondary | ICD-10-CM | POA: Diagnosis not present

## 2018-05-07 DIAGNOSIS — Z940 Kidney transplant status: Secondary | ICD-10-CM | POA: Diagnosis not present

## 2018-05-07 DIAGNOSIS — D899 Disorder involving the immune mechanism, unspecified: Secondary | ICD-10-CM | POA: Diagnosis not present

## 2018-05-07 DIAGNOSIS — Z4822 Encounter for aftercare following kidney transplant: Secondary | ICD-10-CM | POA: Diagnosis not present

## 2018-05-07 DIAGNOSIS — I10 Essential (primary) hypertension: Secondary | ICD-10-CM | POA: Diagnosis not present

## 2018-05-07 DIAGNOSIS — E212 Other hyperparathyroidism: Secondary | ICD-10-CM | POA: Diagnosis not present

## 2018-05-07 DIAGNOSIS — D708 Other neutropenia: Secondary | ICD-10-CM | POA: Diagnosis not present

## 2018-05-07 DIAGNOSIS — N2889 Other specified disorders of kidney and ureter: Secondary | ICD-10-CM | POA: Diagnosis not present

## 2018-05-07 DIAGNOSIS — Z7952 Long term (current) use of systemic steroids: Secondary | ICD-10-CM | POA: Diagnosis not present

## 2018-05-07 DIAGNOSIS — D649 Anemia, unspecified: Secondary | ICD-10-CM | POA: Diagnosis not present

## 2018-05-07 DIAGNOSIS — B259 Cytomegaloviral disease, unspecified: Secondary | ICD-10-CM | POA: Diagnosis not present

## 2018-05-07 DIAGNOSIS — E872 Acidosis: Secondary | ICD-10-CM | POA: Diagnosis not present

## 2018-05-10 DIAGNOSIS — R509 Fever, unspecified: Secondary | ICD-10-CM | POA: Diagnosis not present

## 2018-05-10 DIAGNOSIS — T8611 Kidney transplant rejection: Secondary | ICD-10-CM | POA: Diagnosis not present

## 2018-05-10 DIAGNOSIS — Z940 Kidney transplant status: Secondary | ICD-10-CM | POA: Diagnosis not present

## 2018-05-10 DIAGNOSIS — T8619 Other complication of kidney transplant: Secondary | ICD-10-CM | POA: Diagnosis not present

## 2018-05-10 DIAGNOSIS — R7881 Bacteremia: Secondary | ICD-10-CM | POA: Diagnosis not present

## 2018-05-10 DIAGNOSIS — N179 Acute kidney failure, unspecified: Secondary | ICD-10-CM | POA: Diagnosis not present

## 2018-05-10 DIAGNOSIS — B259 Cytomegaloviral disease, unspecified: Secondary | ICD-10-CM | POA: Diagnosis not present

## 2018-05-10 DIAGNOSIS — D899 Disorder involving the immune mechanism, unspecified: Secondary | ICD-10-CM | POA: Diagnosis not present

## 2018-05-10 DIAGNOSIS — Y999 Unspecified external cause status: Secondary | ICD-10-CM | POA: Diagnosis not present

## 2018-05-10 DIAGNOSIS — X58XXXA Exposure to other specified factors, initial encounter: Secondary | ICD-10-CM | POA: Diagnosis not present

## 2018-05-10 DIAGNOSIS — E872 Acidosis: Secondary | ICD-10-CM | POA: Diagnosis not present

## 2018-05-10 DIAGNOSIS — Z79899 Other long term (current) drug therapy: Secondary | ICD-10-CM | POA: Diagnosis not present

## 2018-05-11 DIAGNOSIS — D708 Other neutropenia: Secondary | ICD-10-CM | POA: Diagnosis not present

## 2018-05-11 DIAGNOSIS — I517 Cardiomegaly: Secondary | ICD-10-CM | POA: Diagnosis not present

## 2018-05-11 DIAGNOSIS — Z79899 Other long term (current) drug therapy: Secondary | ICD-10-CM | POA: Diagnosis not present

## 2018-05-11 DIAGNOSIS — R7881 Bacteremia: Secondary | ICD-10-CM | POA: Diagnosis present

## 2018-05-11 DIAGNOSIS — D509 Iron deficiency anemia, unspecified: Secondary | ICD-10-CM | POA: Diagnosis present

## 2018-05-11 DIAGNOSIS — T375X5A Adverse effect of antiviral drugs, initial encounter: Secondary | ICD-10-CM | POA: Diagnosis not present

## 2018-05-11 DIAGNOSIS — Z792 Long term (current) use of antibiotics: Secondary | ICD-10-CM | POA: Diagnosis not present

## 2018-05-11 DIAGNOSIS — T420X1A Poisoning by hydantoin derivatives, accidental (unintentional), initial encounter: Secondary | ICD-10-CM | POA: Diagnosis present

## 2018-05-11 DIAGNOSIS — Z95828 Presence of other vascular implants and grafts: Secondary | ICD-10-CM | POA: Diagnosis not present

## 2018-05-11 DIAGNOSIS — E871 Hypo-osmolality and hyponatremia: Secondary | ICD-10-CM | POA: Diagnosis not present

## 2018-05-11 DIAGNOSIS — Z9889 Other specified postprocedural states: Secondary | ICD-10-CM | POA: Diagnosis not present

## 2018-05-11 DIAGNOSIS — Z7982 Long term (current) use of aspirin: Secondary | ICD-10-CM | POA: Diagnosis not present

## 2018-05-11 DIAGNOSIS — Z841 Family history of disorders of kidney and ureter: Secondary | ICD-10-CM | POA: Diagnosis not present

## 2018-05-11 DIAGNOSIS — Z940 Kidney transplant status: Secondary | ICD-10-CM | POA: Diagnosis not present

## 2018-05-11 DIAGNOSIS — T8611 Kidney transplant rejection: Secondary | ICD-10-CM | POA: Diagnosis present

## 2018-05-11 DIAGNOSIS — N179 Acute kidney failure, unspecified: Secondary | ICD-10-CM | POA: Diagnosis present

## 2018-05-11 DIAGNOSIS — D72819 Decreased white blood cell count, unspecified: Secondary | ICD-10-CM | POA: Diagnosis not present

## 2018-05-11 DIAGNOSIS — D631 Anemia in chronic kidney disease: Secondary | ICD-10-CM | POA: Diagnosis not present

## 2018-05-11 DIAGNOSIS — T8612 Kidney transplant failure: Secondary | ICD-10-CM | POA: Diagnosis not present

## 2018-05-11 DIAGNOSIS — B259 Cytomegaloviral disease, unspecified: Secondary | ICD-10-CM | POA: Diagnosis present

## 2018-05-11 DIAGNOSIS — B348 Other viral infections of unspecified site: Secondary | ICD-10-CM | POA: Diagnosis not present

## 2018-05-11 DIAGNOSIS — Z8249 Family history of ischemic heart disease and other diseases of the circulatory system: Secondary | ICD-10-CM | POA: Diagnosis not present

## 2018-05-11 DIAGNOSIS — T8619 Other complication of kidney transplant: Secondary | ICD-10-CM | POA: Diagnosis present

## 2018-05-11 DIAGNOSIS — K219 Gastro-esophageal reflux disease without esophagitis: Secondary | ICD-10-CM | POA: Diagnosis present

## 2018-05-11 DIAGNOSIS — I701 Atherosclerosis of renal artery: Secondary | ICD-10-CM | POA: Diagnosis present

## 2018-05-11 DIAGNOSIS — Z8711 Personal history of peptic ulcer disease: Secondary | ICD-10-CM | POA: Diagnosis not present

## 2018-05-11 DIAGNOSIS — R509 Fever, unspecified: Secondary | ICD-10-CM | POA: Diagnosis not present

## 2018-05-11 DIAGNOSIS — E872 Acidosis: Secondary | ICD-10-CM | POA: Diagnosis present

## 2018-05-11 DIAGNOSIS — Z4822 Encounter for aftercare following kidney transplant: Secondary | ICD-10-CM | POA: Diagnosis not present

## 2018-05-11 DIAGNOSIS — R9431 Abnormal electrocardiogram [ECG] [EKG]: Secondary | ICD-10-CM | POA: Diagnosis not present

## 2018-05-11 DIAGNOSIS — D649 Anemia, unspecified: Secondary | ICD-10-CM | POA: Diagnosis not present

## 2018-05-11 DIAGNOSIS — I10 Essential (primary) hypertension: Secondary | ICD-10-CM | POA: Diagnosis present

## 2018-05-11 DIAGNOSIS — Z7952 Long term (current) use of systemic steroids: Secondary | ICD-10-CM | POA: Diagnosis not present

## 2018-05-18 DIAGNOSIS — N189 Chronic kidney disease, unspecified: Secondary | ICD-10-CM | POA: Diagnosis not present

## 2018-05-18 DIAGNOSIS — Z4822 Encounter for aftercare following kidney transplant: Secondary | ICD-10-CM | POA: Diagnosis not present

## 2018-05-18 DIAGNOSIS — D631 Anemia in chronic kidney disease: Secondary | ICD-10-CM | POA: Diagnosis not present

## 2018-05-18 DIAGNOSIS — Z79899 Other long term (current) drug therapy: Secondary | ICD-10-CM | POA: Diagnosis not present

## 2018-05-19 DIAGNOSIS — T8611 Kidney transplant rejection: Secondary | ICD-10-CM | POA: Diagnosis not present

## 2018-05-19 DIAGNOSIS — E872 Acidosis: Secondary | ICD-10-CM | POA: Diagnosis not present

## 2018-05-19 DIAGNOSIS — Z79899 Other long term (current) drug therapy: Secondary | ICD-10-CM | POA: Diagnosis not present

## 2018-05-19 DIAGNOSIS — I10 Essential (primary) hypertension: Secondary | ICD-10-CM | POA: Diagnosis not present

## 2018-05-19 DIAGNOSIS — Z4822 Encounter for aftercare following kidney transplant: Secondary | ICD-10-CM | POA: Diagnosis not present

## 2018-05-22 IMAGING — CT CT ABD-PELV W/ CM
2 of 4 series · 16 of 46 positions shown, 18 images · IV contrast (iopamidol)
Comparison: CT abdomen dated 06/08/2015.

CLINICAL DATA: Patient c/o epigastric pain that began today.
Patient states that was nauseas and has had a small amount of
diarrhea today. Patient now complains of sharp pain in her abdomen.

EXAM:
CT ABDOMEN AND PELVIS WITH CONTRAST
TECHNIQUE: Multidetector CT imaging of the abdomen and pelvis was performed
using the standard protocol following bolus administration of
intravenous contrast.
CONTRAST:  75mL [id number] IOPAMIDOL (IM6JEE-EWW) INJECTION 61%

[Series 2: rtn a/p with · axial · 0.65mm/px · z∈[-422,-42]mm · 13 of 86 slices shown, 15 images]
[im 5/86  soft-tissue]
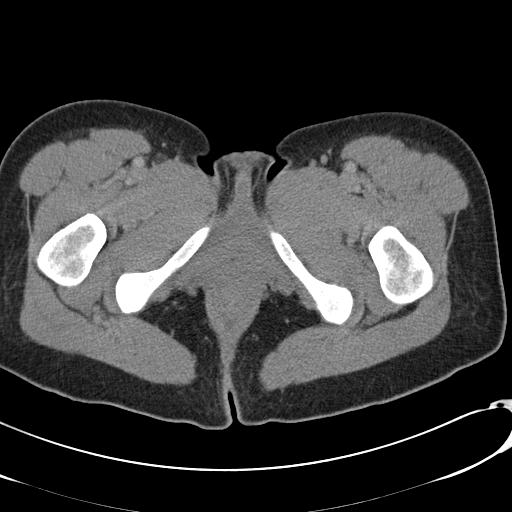
[im 5/86  bone]
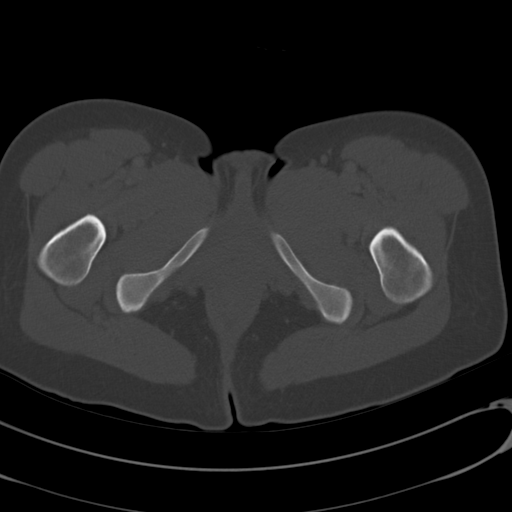
[im 10/86  soft-tissue]
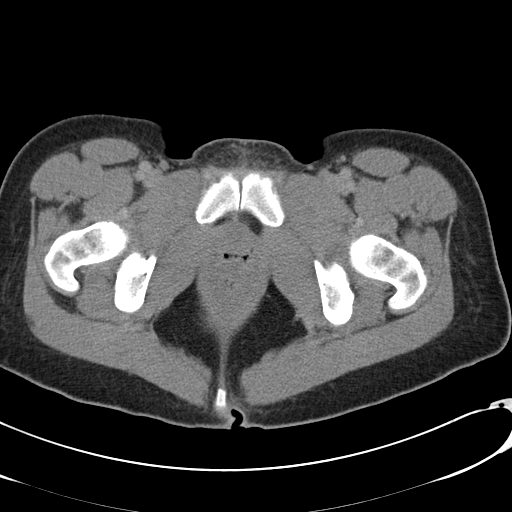
[im 19/86  soft-tissue]
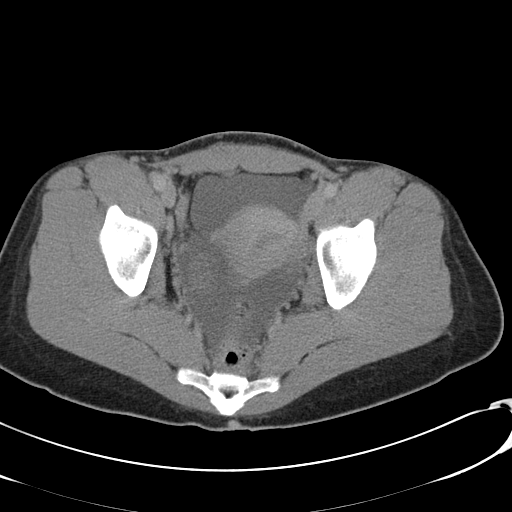
[im 24/86  soft-tissue]
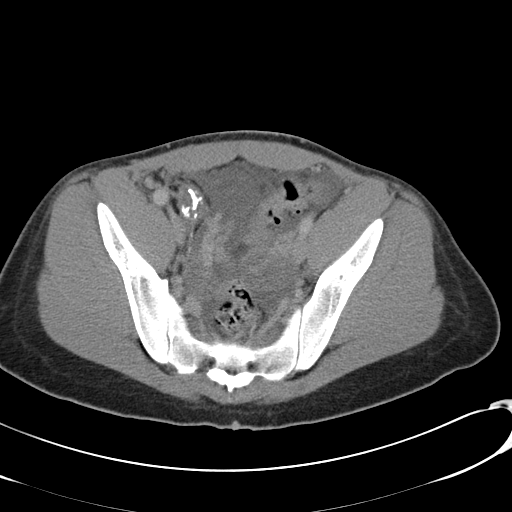
[im 29/86  soft-tissue]
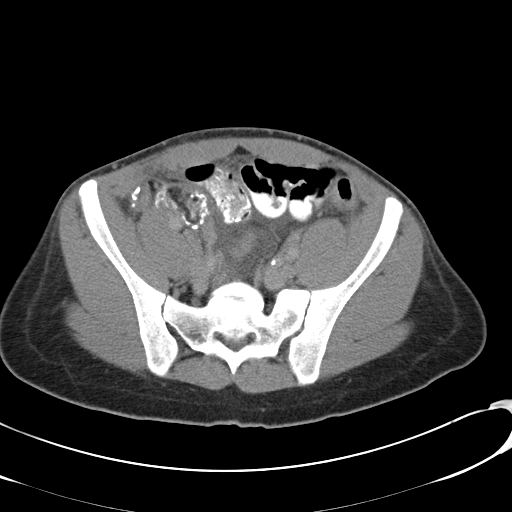
[im 38/86  soft-tissue]
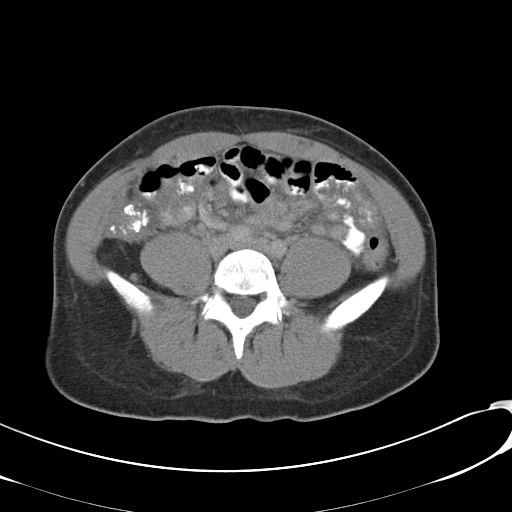
[im 43/86  soft-tissue]
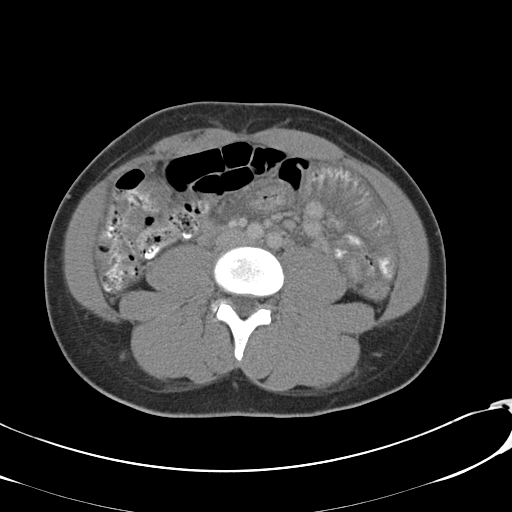
[im 48/86  soft-tissue]
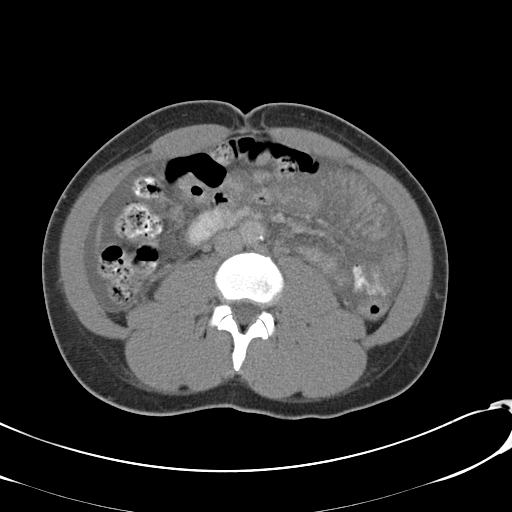
[im 57/86  soft-tissue]
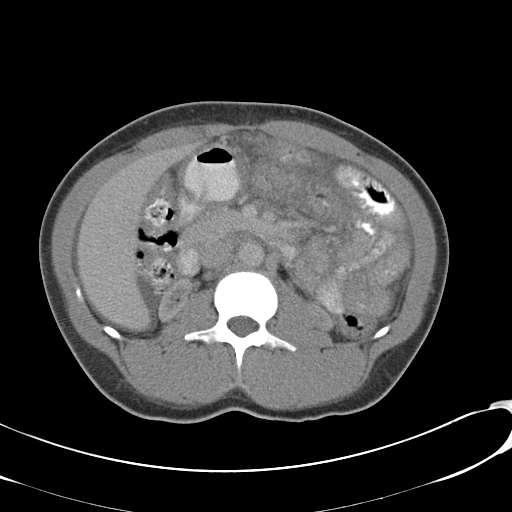
[im 57/86  bone]
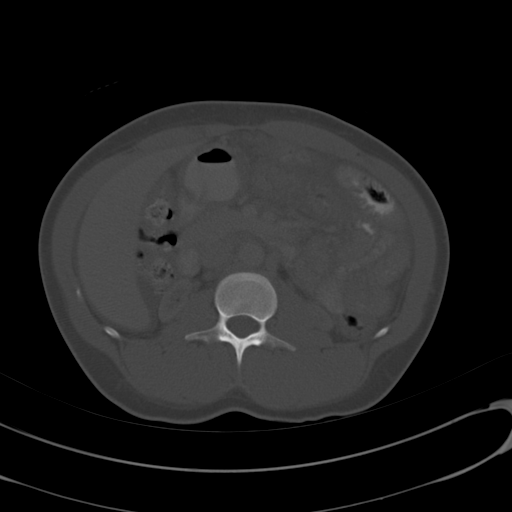
[im 62/86  soft-tissue]
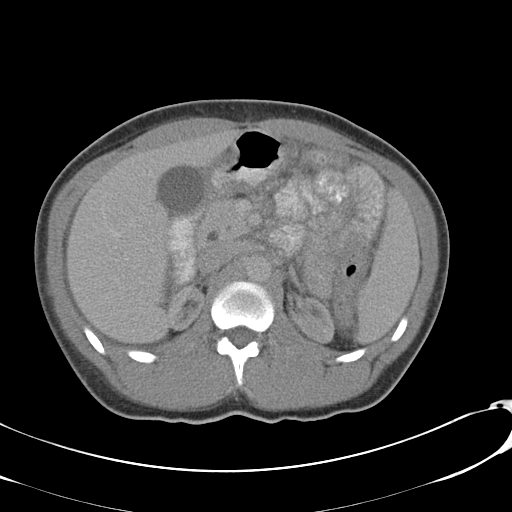
[im 67/86  soft-tissue]
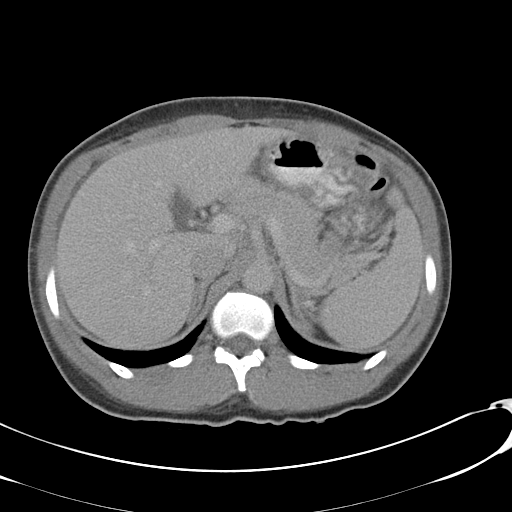
[im 76/86  soft-tissue]
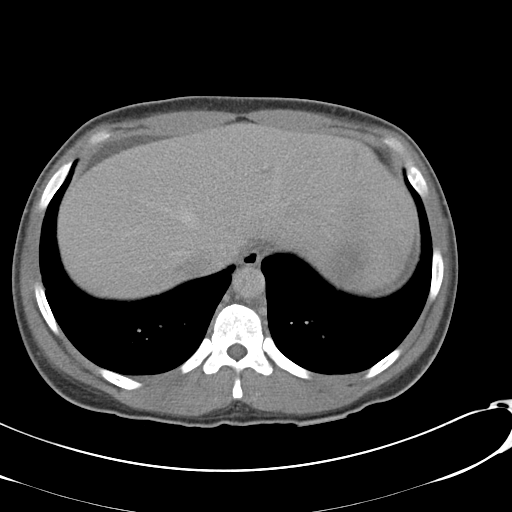
[im 81/86  soft-tissue]
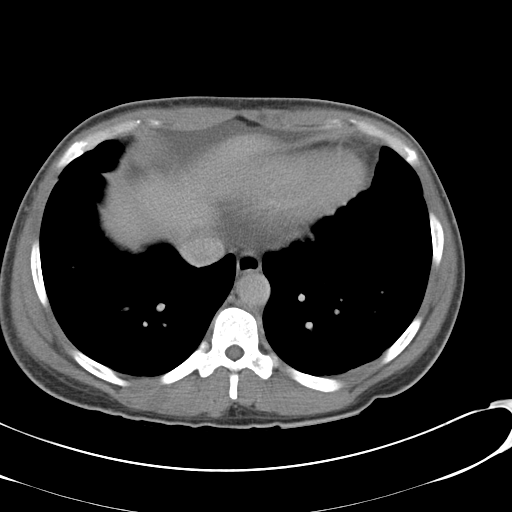

[Series 602: <mpr thick range> · coronal · 0.84mm/px · 3 of 112 slices shown]
[im 38/112  soft-tissue]
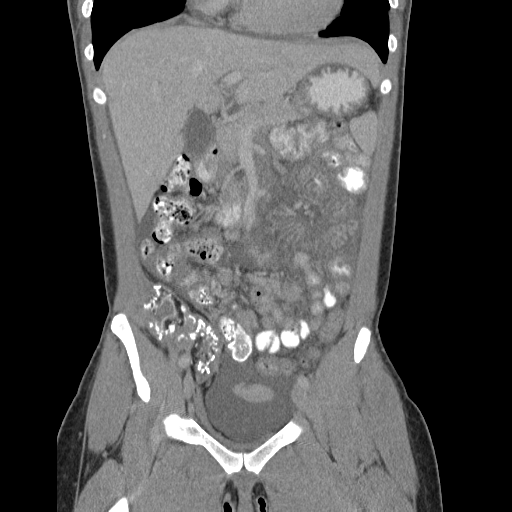
[im 50/112  soft-tissue]
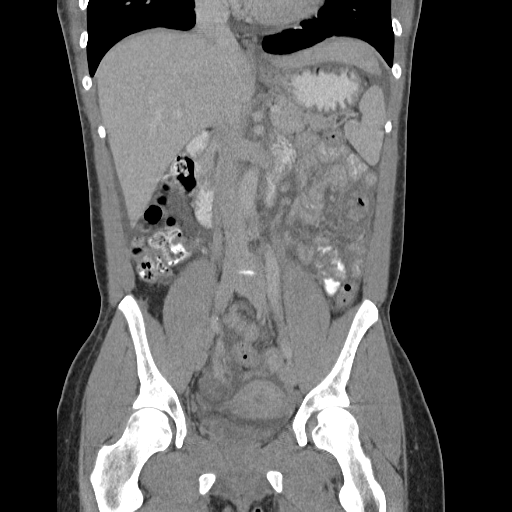
[im 62/112  soft-tissue]
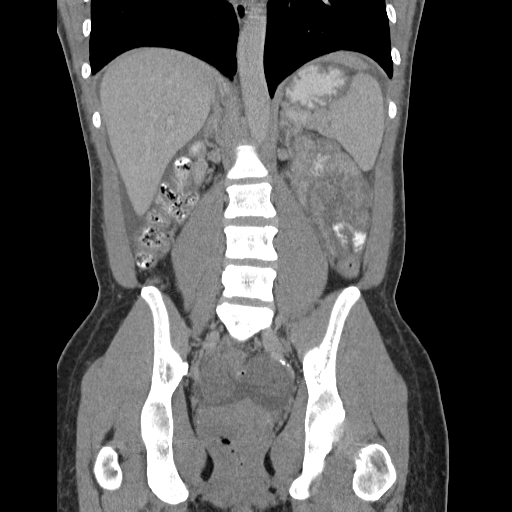

[16 of 46 positions shown; findings below may reference images not displayed]

FINDINGS: Lower chest: No acute abnormality.

Hepatobiliary: No focal liver abnormality is seen. No gallstones,
gallbladder wall thickening, or biliary dilatation.

Pancreas: Unremarkable. No pancreatic ductal dilatation or
surrounding inflammatory changes.

Spleen: Normal in size without focal abnormality.

Adrenals/Urinary Tract: Native kidneys are atrophic bilaterally. The
2 right lower quadrant transplant kidneys now appear heavily
calcified and likely atrophic, not well seen.

Stomach/Bowel: There is extensive thickening of the walls of the
small bowel in the upper abdomen and mid abdomen. Small bowel within
the lower abdomen and pelvis has a relatively normal appearance.
Large bowel is unremarkable. No dilated large or small bowel loops.

Vascular/Lymphatic: No significant vascular findings are present. No
enlarged abdominal or pelvic lymph nodes.

Reproductive: Uterus and bilateral adnexa are unremarkable.

Other: Moderate amount of free fluid throughout the abdomen and
pelvis. No circumscribed fluid collection or abscess collection
seen.

Musculoskeletal: No acute or suspicious osseous lesion. Superficial
soft tissues are unremarkable.
IMPRESSION: 1. Prominent thickening of the walls of the small bowel throughout
the upper abdomen and mid abdomen. Given the history of transplanted
kidneys, this could represent post transplant lymphoproliferative
disorder. Alternatively, findings could represent an acute enteritis
of infectious or inflammatory nature.
2. Transplant kidneys within the right lower quadrant now appear
heavily calcified, presumably atrophic, not well seen. Native
kidneys are atrophic, similar in appearance to the previous exam.
3. Moderate amount of free fluid throughout the abdomen and pelvis.
No circumscribed fluid collection or abscess collection seen. No
free intraperitoneal air.
These results were called by telephone at the time of interpretation
on 03/24/2016 at [DATE] to Dr. MOGENS TUBORG , who verbally
acknowledged these results.

## 2018-05-27 DIAGNOSIS — D8989 Other specified disorders involving the immune mechanism, not elsewhere classified: Secondary | ICD-10-CM | POA: Diagnosis not present

## 2018-05-27 DIAGNOSIS — B258 Other cytomegaloviral diseases: Secondary | ICD-10-CM | POA: Diagnosis not present

## 2018-05-27 DIAGNOSIS — Z4822 Encounter for aftercare following kidney transplant: Secondary | ICD-10-CM | POA: Diagnosis not present

## 2018-05-27 DIAGNOSIS — D72819 Decreased white blood cell count, unspecified: Secondary | ICD-10-CM | POA: Diagnosis not present

## 2018-05-27 DIAGNOSIS — B259 Cytomegaloviral disease, unspecified: Secondary | ICD-10-CM | POA: Diagnosis not present

## 2018-05-27 DIAGNOSIS — N179 Acute kidney failure, unspecified: Secondary | ICD-10-CM | POA: Diagnosis not present

## 2018-05-27 DIAGNOSIS — Z940 Kidney transplant status: Secondary | ICD-10-CM | POA: Diagnosis not present

## 2018-05-27 DIAGNOSIS — Z79899 Other long term (current) drug therapy: Secondary | ICD-10-CM | POA: Diagnosis not present

## 2018-05-27 DIAGNOSIS — I10 Essential (primary) hypertension: Secondary | ICD-10-CM | POA: Diagnosis not present

## 2018-05-27 DIAGNOSIS — E872 Acidosis: Secondary | ICD-10-CM | POA: Diagnosis not present

## 2018-05-27 DIAGNOSIS — Z7952 Long term (current) use of systemic steroids: Secondary | ICD-10-CM | POA: Diagnosis not present

## 2018-05-27 DIAGNOSIS — D649 Anemia, unspecified: Secondary | ICD-10-CM | POA: Diagnosis not present

## 2018-05-27 DIAGNOSIS — T8619 Other complication of kidney transplant: Secondary | ICD-10-CM | POA: Diagnosis not present

## 2018-06-10 DIAGNOSIS — Z7952 Long term (current) use of systemic steroids: Secondary | ICD-10-CM | POA: Diagnosis not present

## 2018-06-10 DIAGNOSIS — I701 Atherosclerosis of renal artery: Secondary | ICD-10-CM | POA: Diagnosis not present

## 2018-06-10 DIAGNOSIS — N2581 Secondary hyperparathyroidism of renal origin: Secondary | ICD-10-CM | POA: Diagnosis not present

## 2018-06-10 DIAGNOSIS — B259 Cytomegaloviral disease, unspecified: Secondary | ICD-10-CM | POA: Diagnosis not present

## 2018-06-10 DIAGNOSIS — E872 Acidosis: Secondary | ICD-10-CM | POA: Diagnosis not present

## 2018-06-10 DIAGNOSIS — Z79899 Other long term (current) drug therapy: Secondary | ICD-10-CM | POA: Diagnosis not present

## 2018-06-10 DIAGNOSIS — Z4822 Encounter for aftercare following kidney transplant: Secondary | ICD-10-CM | POA: Diagnosis not present

## 2018-06-10 DIAGNOSIS — N179 Acute kidney failure, unspecified: Secondary | ICD-10-CM | POA: Diagnosis not present

## 2018-06-10 DIAGNOSIS — D72819 Decreased white blood cell count, unspecified: Secondary | ICD-10-CM | POA: Diagnosis not present

## 2018-06-10 DIAGNOSIS — I10 Essential (primary) hypertension: Secondary | ICD-10-CM | POA: Diagnosis not present

## 2018-06-10 DIAGNOSIS — Z940 Kidney transplant status: Secondary | ICD-10-CM | POA: Diagnosis not present

## 2018-06-10 DIAGNOSIS — D649 Anemia, unspecified: Secondary | ICD-10-CM | POA: Diagnosis not present

## 2018-07-12 ENCOUNTER — Emergency Department (HOSPITAL_COMMUNITY): Payer: Medicare Other

## 2018-07-12 ENCOUNTER — Encounter (HOSPITAL_COMMUNITY): Payer: Self-pay | Admitting: Emergency Medicine

## 2018-07-12 ENCOUNTER — Other Ambulatory Visit: Payer: Self-pay

## 2018-07-12 ENCOUNTER — Emergency Department (HOSPITAL_COMMUNITY)
Admission: EM | Admit: 2018-07-12 | Discharge: 2018-07-12 | Disposition: A | Discharge status: Home / Self Care | Payer: Medicare Other | Attending: Emergency Medicine | Admitting: Emergency Medicine

## 2018-07-12 DIAGNOSIS — Z940 Kidney transplant status: Secondary | ICD-10-CM | POA: Diagnosis not present

## 2018-07-12 DIAGNOSIS — H9201 Otalgia, right ear: Secondary | ICD-10-CM | POA: Diagnosis not present

## 2018-07-12 DIAGNOSIS — R05 Cough: Secondary | ICD-10-CM | POA: Diagnosis not present

## 2018-07-12 DIAGNOSIS — N186 End stage renal disease: Secondary | ICD-10-CM | POA: Insufficient documentation

## 2018-07-12 DIAGNOSIS — I120 Hypertensive chronic kidney disease with stage 5 chronic kidney disease or end stage renal disease: Secondary | ICD-10-CM | POA: Diagnosis not present

## 2018-07-12 DIAGNOSIS — Z79899 Other long term (current) drug therapy: Secondary | ICD-10-CM | POA: Insufficient documentation

## 2018-07-12 LAB — POC URINE PREG, ED: Preg Test, Ur: NEGATIVE

## 2018-07-12 MED ORDER — FLUTICASONE PROPIONATE 50 MCG/ACT NA SUSP: 1.0000 | Freq: Every day | NASAL | 2 refills | Status: DC

## 2018-07-12 MED ORDER — OXYMETAZOLINE HCL 0.05 % NA SOLN: 1.0000 | Freq: Two times a day (BID) | NASAL | 0 refills | Status: DC

## 2018-07-12 NOTE — ED Triage Notes (Addendum)
Pt reports pain r/ear x 2 weeks. Pt is a kidney transplant pt from [facility]. Using ear drops only. Pt reports a productive cough x 2 weeks

## 2018-07-12 NOTE — Discharge Instructions (Signed)
Please see the information and instructions below regarding your visit.  Your diagnoses today include:  1. Right ear pain    Tests performed today include: See side panel of your discharge paperwork for testing performed today. Vital signs are listed at the bottom of these instructions.   Medications prescribed:    Take any prescribed medications only as prescribed, and any over the counter medications only as directed on the packaging.  Please use flonase one spray in each nostril in the morning.   Please use the Afrin nasal spray in each nostril twice a day.  Do not use more than 2 days because it can cause worsening congestion.  Home care instructions:  Please follow any educational materials contained in this packet.   Follow-up instructions: Please follow-up with your primary care provider in  for further evaluation of your symptoms if they are not completely improved.   Please follow up with  in  for .  Return instructions:  Please return to the Emergency Department if you experience worsening symptoms.  Please return if you have any other emergent concerns.  Additional Information:   Your vital signs today were: BP 127/80 (BP Location: Right Arm)    Pulse (!) 103    Temp 98.8 F (37.1 C) (Oral)    Resp 18    Wt 56.7 kg    SpO2 95%    BMI 24.41 kg/m  If your blood pressure (BP) was elevated on multiple readings during this visit above 130 for the top number or above 80 for the bottom number, please have this repeated by your primary care provider within one month. --------------  Thank you for allowing [place] to participate in your care today.

## 2018-07-12 NOTE — ED Provider Notes (Signed)
[facility] DEPT Provider Note   CSN: [id number] Arrival date & time: 07/12/18  1730     History   Chief Complaint Chief Complaint  Patient presents with  . Otalgia    HPI Kerry Cook is a 33 y.o. female.  HPI  Patient is a 33 year old female with a history of kidney transplant in 2012 on Prograf, Myfortic, and prednisone, presenting for right otalgia.  She reports was present for 2 weeks.  Patient reports she has a congestion and rhinorrhea for the past 2 weeks and some mild right-sided facial pain however this has improved and is resolved.  She reports she is also had a cough productive of some white sputum over the past 2 weeks.  Denies fevers or chills.  Patient denies drainage from the ear.  She reports that her ear hurts worse when she pushes on it.  No recent swimming.  Denies abnormal sounds from the right ear.  Past Medical History:  Diagnosis Date  . Chronic kidney disease   . Dialysis patient ([place])   . History of kidney transplant 2012   kidney failure due to hypertension  . Hypertension   . Multiple gastric ulcers   . Pneumonia 01/23/2015    Patient Active Problem List   Diagnosis Date Noted  . Cellulitis 04/22/2017  . Cellulitis of left upper extremity 04/22/2017  . Generalized abdominal pain   . Medicare annual wellness visit, initial 10/31/2015  . Cephalalgia   . Headache 06/07/2015  . Hypertension   . History of kidney transplant   . Anemia in chronic kidney disease 10/01/2007  . End stage renal disease on dialysis ([place]) 10/01/2007  . PAP SMEAR, LGSIL, ABNORMAL 10/01/2007  . PROTEINURIA 08/21/2006    Past Surgical History:  Procedure Laterality Date  . ESOPHAGOGASTRODUODENOSCOPY N/A 04/01/2016   Procedure: ESOPHAGOGASTRODUODENOSCOPY (EGD);  Surgeon: Gatha Mayer, MD;  Location: [facility] ENDOSCOPY;  Service: Endoscopy;  Laterality: N/A;  . ESOPHAGOGASTRODUODENOSCOPY (EGD) WITH PROPOFOL N/A 11/20/2016   Procedure:  ESOPHAGOGASTRODUODENOSCOPY (EGD) WITH PROPOFOL;  Surgeon: Doran Stabler, MD;  Location: [facility];  Service: Endoscopy;  Laterality: N/A;  . KIDNEY TRANSPLANT Bilateral 2012     OB History    Gravida  1   Para      Term      Preterm      AB  1   Living  0     SAB  1   TAB      Ectopic      Multiple      Live Births               Home Medications    Prior to Admission medications   Medication Sig Start Date End Date Taking? Authorizing Provider  amLODipine (NORVASC) 10 MG tablet Take 1 tablet (10 mg total) by mouth every evening. Patient taking differently: Take 10 mg by mouth daily.  09/13/15   [name], MD  azithromycin (ZITHROMAX) 250 MG tablet Take 1 tablet (250 mg total) by mouth daily. Take first 2 tablets together, then 1 every day until finished. Patient not taking: Reported on 01/18/2018 08/27/17   Blue, Minette Brine C, PA-C  cephALEXin (KEFLEX) 250 MG capsule Take 250 mg by mouth 2 (two) times daily. 01/15/18   [provider]  cinacalcet (SENSIPAR) 30 MG tablet Take 30 mg by mouth daily. 01/18/18   [provider]  cloNIDine (CATAPRES) 0.1 MG tablet Take 0.1 mg by mouth 2 (two) times daily.  [provider]  doxycycline (VIBRAMYCIN) 100 MG capsule Take 1 capsule (100 mg total) by mouth 2 (two) times daily. Patient not taking: Reported on 01/18/2018 09/08/17   Ward, Ozella Almond, PA-C  HYDROcodone-acetaminophen (NORCO/VICODIN) 5-325 MG tablet Take 1 tablet by mouth every 4 (four) hours as needed for moderate pain.  01/17/18   [provider]  labetalol (NORMODYNE) 200 MG tablet Take 1 tablet (200 mg total) by mouth 2 (two) times daily. 04/02/16   Elgergawy, Silver Huguenin, MD  mycophenolate (MYFORTIC) 180 MG EC tablet Take 720 mg by mouth 2 (two) times daily. 01/15/18   [provider]  NIFEdipine (PROCARDIA XL/ADALAT-CC) 90 MG 24 hr tablet Take 90 mg by mouth daily. 01/14/18   [provider]  NIFEdipine  (PROCARDIA-XL/ADALAT-CC/NIFEDICAL-XL) 30 MG 24 hr tablet Take 30 mg by mouth daily. 01/12/18   [provider]  pantoprazole (PROTONIX) 40 MG tablet Take 40 mg by mouth daily. 01/15/18   [provider]  predniSONE (DELTASONE) 5 MG tablet Take 10 mg by mouth daily. 01/15/18   [provider]  PROGRAF 1 MG capsule Take 3-4 mg by mouth See admin instructions. Take  4 capsules by mouth in the morning and 3 capsules by mouth in the evening. 01/15/18   [provider]  sulfamethoxazole-trimethoprim (BACTRIM,SEPTRA) 400-80 MG tablet Take 1 tablet by mouth every Monday, Wednesday, and Friday. 01/15/18   [provider]  tamsulosin (FLOMAX) 0.4 MG CAPS capsule Take 0.4 mg by mouth 2 (two) times daily. 12/24/17   [provider]  valGANciclovir (VALCYTE) 450 MG tablet Take 450 mg by mouth daily. 01/15/18   [provider]    Family History Family History  Problem Relation Age of Onset  . Kidney disease Father   . Lupus Maternal Grandmother   . Cancer Maternal Grandfather   . Colon cancer Neg Hx   . Stomach cancer Neg Hx   . Rectal cancer Neg Hx   . Esophageal cancer Neg Hx   . Liver cancer Neg Hx     Social History Social History   Tobacco Use  . Smoking status: Never Smoker  . Smokeless tobacco: Never Used  Substance Use Topics  . Alcohol use: No    Alcohol/week: 0.0 standard drinks  . Drug use: No     Allergies   Zosyn [piperacillin sod-tazobactam so]   Review of Systems Review of Systems  Constitutional: Negative for chills and fever.  HENT: Positive for congestion, ear pain and rhinorrhea. Negative for ear discharge, sinus pressure, sinus pain, sore throat, trouble swallowing and voice change.   Respiratory: Positive for cough. Negative for shortness of breath.   Gastrointestinal: Negative for nausea and vomiting.     Physical Exam Updated Vital Signs BP 127/80 (BP Location: Right Arm)   Pulse (!) 103   Temp 98.8 F  (37.1 C) (Oral)   Resp 18   Wt 56.7 kg   SpO2 95%   BMI 24.41 kg/m   Physical Exam Vitals signs and nursing note reviewed.  Constitutional:      General: She is not in acute distress.    Appearance: She is well-developed. She is not diaphoretic.     Comments: Sitting comfortably in bed.  HENT:     Head: Normocephalic and atraumatic.     Right Ear: Tympanic membrane normal.     Left Ear: Tympanic membrane normal.     Ears:     Comments: No tenderness to palpation of bilateral tragus, mastoid,  or pinna.  Bilaterally there is no evidence of external auditory canal edema. Eyes:     General:        Right eye: No discharge.        Left eye: No discharge.     Extraocular Movements: Extraocular movements intact.     Conjunctiva/sclera: Conjunctivae normal.     Pupils: Pupils are equal, round, and reactive to light.     Comments: EOMs normal to gross examination.  Neck:     Musculoskeletal: Normal range of motion and neck supple.  Cardiovascular:     Rate and Rhythm: Normal rate and regular rhythm.     Heart sounds: Normal heart sounds.  Pulmonary:     Effort: Pulmonary effort is normal.     Breath sounds: Normal breath sounds. No wheezing, rhonchi or rales.  Abdominal:     General: There is no distension.  Musculoskeletal: Normal range of motion.  Lymphadenopathy:     Cervical: No cervical adenopathy.  Skin:    General: Skin is warm and dry.  Neurological:     Mental Status: She is alert.     Comments: Cranial nerves intact to gross observation. Patient moves extremities without difficulty.  Psychiatric:        Behavior: Behavior normal.        Thought Content: Thought content normal.        Judgment: Judgment normal.      ED Treatments / Results  Labs (all labs ordered are listed, but only abnormal results are displayed) Labs Reviewed  POC URINE PREG, ED    EKG None  Radiology Dg Chest 2 View  Result Date: 07/12/2018 CLINICAL DATA:  Pain in right ear for 2  weeks. Kidney transplant patient. Productive cough for 2 weeks. EXAM: CHEST - 2 VIEW COMPARISON:  September 08, 2016 FINDINGS: The heart size and mediastinal contours are within normal limits. Both lungs are clear. The visualized skeletal structures are unremarkable. IMPRESSION: No active cardiopulmonary disease. Electronically Signed   By: Dorise Bullion III M.D   On: 07/12/2018 19:28    Procedures Procedures (including critical care time)  Medications Ordered in ED Medications - No data to display   Initial Impression / Assessment and Plan / ED Course  I have reviewed the triage vital signs and the nursing notes.  Pertinent labs & imaging results that were available during my care of the patient were reviewed by me and considered in my medical decision making (see chart for details).    Patient is nontoxic-appearing, afebrile, in no acute distress.  She did have a slightly elevated triage pulse.  This is resolved.  Given patient's immunocompromise status, she may not mount a fever.  However patient does not have any major criteria for acute sinusitis.  Chest x-ray is clear of pneumonia and she has normal lung exam.  I suspect that patient symptoms are likely secondary to eustachian tube dysfunction.  She is denying any headaches, visual disturbance, or other concerning signs of a progressive and invasive sinusitis.  Considered performing CT scan of temporal bones and sinuses, however given clinical status do not feel this was indicated.  I did discuss with the patient that she is not better with treatment for eustachian tube dysfunction in the next 48 to 72 hours, she needs to be evaluated by her primary care provider for further management.  Patient was also given ENT follow-up and instructed that she should follow-up with this clinic.  No indications for antibiotics today.  Patient was given return precautions for any development of fevers, worsening facial pain, purulent drainage from the ear, or  sinuses, worsening cough or any new or worsening symptoms.  Patient is in understanding and agrees with the plan of care.  Final Clinical Impressions(s) / ED Diagnoses   Final diagnoses:  Right ear pain    ED Discharge Orders         Ordered    fluticasone (FLONASE) 50 MCG/ACT nasal spray  Daily     07/12/18 2101    oxymetazoline (AFRIN NASAL SPRAY) 0.05 % nasal spray  2 times daily     07/12/18 2101           Tamala Julian 07/12/18 2101    Jola Schmidt, MD 07/12/18 2151

## 2019-03-22 ENCOUNTER — Encounter (HOSPITAL_COMMUNITY): Payer: Self-pay | Admitting: Emergency Medicine

## 2019-03-22 ENCOUNTER — Ambulatory Visit (INDEPENDENT_AMBULATORY_CARE_PROVIDER_SITE_OTHER)
Admission: EM | Admit: 2019-03-22 | Discharge: 2019-03-22 | Disposition: A | Discharge status: Home / Self Care | Payer: Medicare Other | Source: Home / Self Care | Attending: Family Medicine | Admitting: Family Medicine

## 2019-03-22 ENCOUNTER — Other Ambulatory Visit: Payer: Self-pay

## 2019-03-22 ENCOUNTER — Emergency Department (HOSPITAL_COMMUNITY)
Admission: EM | Admit: 2019-03-22 | Discharge: 2019-03-22 | Discharge status: Against Medical Advice | Payer: Medicare Other | Attending: Emergency Medicine | Admitting: Emergency Medicine

## 2019-03-22 DIAGNOSIS — R112 Nausea with vomiting, unspecified: Secondary | ICD-10-CM

## 2019-03-22 DIAGNOSIS — R197 Diarrhea, unspecified: Secondary | ICD-10-CM | POA: Diagnosis not present

## 2019-03-22 DIAGNOSIS — K6289 Other specified diseases of anus and rectum: Secondary | ICD-10-CM | POA: Diagnosis not present

## 2019-03-22 DIAGNOSIS — Z940 Kidney transplant status: Secondary | ICD-10-CM

## 2019-03-22 DIAGNOSIS — I10 Essential (primary) hypertension: Secondary | ICD-10-CM

## 2019-03-22 DIAGNOSIS — R531 Weakness: Secondary | ICD-10-CM | POA: Diagnosis not present

## 2019-03-22 DIAGNOSIS — Z5321 Procedure and treatment not carried out due to patient leaving prior to being seen by health care provider: Secondary | ICD-10-CM | POA: Diagnosis not present

## 2019-03-22 DIAGNOSIS — R5383 Other fatigue: Secondary | ICD-10-CM

## 2019-03-22 LAB — CBC
HCT: 33.3 % — ABNORMAL LOW (ref 36.0–46.0)
Hemoglobin: 10 g/dL — ABNORMAL LOW (ref 12.0–15.0)
MCH: 31.5 pg (ref 26.0–34.0)
MCHC: 30 g/dL (ref 30.0–36.0)
MCV: 105 fL — ABNORMAL HIGH (ref 80.0–100.0)
Platelets: 119 10*3/uL — ABNORMAL LOW (ref 150–400)
RBC: 3.17 MIL/uL — ABNORMAL LOW (ref 3.87–5.11)
RDW: 15.6 % — ABNORMAL HIGH (ref 11.5–15.5)
WBC: 3.7 10*3/uL — ABNORMAL LOW (ref 4.0–10.5)
nRBC: 0 % (ref 0.0–0.2)

## 2019-03-22 LAB — URINALYSIS, ROUTINE W REFLEX MICROSCOPIC
Bilirubin Urine: NEGATIVE
Glucose, UA: NEGATIVE mg/dL
Ketones, ur: NEGATIVE mg/dL
Nitrite: NEGATIVE
Protein, ur: 30 mg/dL — AB
Specific Gravity, Urine: 1.01 (ref 1.005–1.030)
pH: 6 (ref 5.0–8.0)

## 2019-03-22 LAB — COMPREHENSIVE METABOLIC PANEL
ALT: 11 U/L (ref 0–44)
AST: 23 U/L (ref 15–41)
Albumin: 4 g/dL (ref 3.5–5.0)
Alkaline Phosphatase: 127 U/L — ABNORMAL HIGH (ref 38–126)
Anion gap: 9 (ref 5–15)
BUN: 30 mg/dL — ABNORMAL HIGH (ref 6–20)
CO2: 19 mmol/L — ABNORMAL LOW (ref 22–32)
Calcium: 8.6 mg/dL — ABNORMAL LOW (ref 8.9–10.3)
Chloride: 111 mmol/L (ref 98–111)
Creatinine, Ser: 2.62 mg/dL — ABNORMAL HIGH (ref 0.44–1.00)
GFR calc Af Amer: 27 mL/min — ABNORMAL LOW (ref >60)
GFR calc non Af Amer: 23 mL/min — ABNORMAL LOW (ref >60)
Glucose, Bld: 97 mg/dL (ref 70–99)
Potassium: 4.1 mmol/L (ref 3.5–5.1)
Sodium: 139 mmol/L (ref 135–145)
Total Bilirubin: 0.7 mg/dL (ref 0.3–1.2)
Total Protein: 6.4 g/dL — ABNORMAL LOW (ref 6.5–8.1)

## 2019-03-22 LAB — I-STAT BETA HCG BLOOD, ED (MC, WL, AP ONLY): I-stat hCG, quantitative: <5 m[IU]/mL (ref <5)

## 2019-03-22 LAB — LIPASE, BLOOD: Lipase: 33 U/L (ref 11–51)

## 2019-03-22 MED ORDER — ONDANSETRON 4 MG PO TBDP
ORAL_TABLET | ORAL | Status: AC
  Filled 2019-03-22: qty 1

## 2019-03-22 MED ORDER — ONDANSETRON 4 MG PO TBDP: 4.0000 mg | ORAL_TABLET | Freq: Once | ORAL | Status: DC

## 2019-03-22 MED ORDER — SODIUM CHLORIDE 0.9% FLUSH: 3.0000 mL | Freq: Once | INTRAVENOUS | Status: DC

## 2019-03-22 NOTE — ED Triage Notes (Signed)
Pt. Stated, I started having rectal pain last night and today I was having N/V .

## 2019-03-22 NOTE — Discharge Instructions (Signed)
Please go to the ER for further evaluation °

## 2019-03-22 NOTE — ED Notes (Signed)
Pt. Stated, My rectum is swollen

## 2019-03-22 NOTE — ED Notes (Signed)
Pt left 45 minutes ago and has not returned in.

## 2019-03-22 NOTE — ED Notes (Signed)
Patient is being discharged from the [facility] and sent to the Emergency Department via personal vehicle by family. Per Provider Augusto Gamble, patient is stable but in need of higher level of care due to elevated blood pressure & vomiting Patient is aware and verbalizes understanding of plan of care.   Vitals:   03/22/19 1722  BP: (!) 210/152  Pulse: (!) 122  Resp: (!) 22  Temp: 98 F (36.7 C)  SpO2: 100%

## 2019-03-22 NOTE — ED Provider Notes (Signed)
Patient presents with complaints of rectal pain. She has had diarrhea for the past 5 days. Yesterday had a more formed bowel movement, this was followed by rectal pain. Now with vomiting. No blood or black in emesis. Some weakness and fatigue. History  Of kidney transplant. Did take her medications, including her BP medication this morning. In triage HR noted to be 120-125 with bp >200. Patient appears ill, vomiting. At this time with patient significant past medical history and generally more medically fragile I do recommend further evaluation in the ER. Patient provided wheelchair assist to her family member vehicle for transport to the ER. Patient verbalized understanding and agreeable to plan.      Zigmund Gottron, NP 03/22/2019 5:25 PM    Zigmund Gottron, NP 03/22/19 1725

## 2019-10-19 ENCOUNTER — Other Ambulatory Visit: Payer: Self-pay

## 2019-10-19 ENCOUNTER — Emergency Department (HOSPITAL_COMMUNITY)
Admission: EM | Admit: 2019-10-19 | Discharge: 2019-10-19 | Disposition: A | Discharge status: Other Acute Inpatient Hospital | Payer: Medicare Other | Attending: Emergency Medicine | Admitting: Emergency Medicine

## 2019-10-19 ENCOUNTER — Emergency Department (HOSPITAL_COMMUNITY): Payer: Medicare Other

## 2019-10-19 ENCOUNTER — Encounter (HOSPITAL_COMMUNITY): Payer: Self-pay

## 2019-10-19 DIAGNOSIS — R110 Nausea: Secondary | ICD-10-CM | POA: Diagnosis not present

## 2019-10-19 DIAGNOSIS — I10 Essential (primary) hypertension: Secondary | ICD-10-CM | POA: Insufficient documentation

## 2019-10-19 DIAGNOSIS — Z79899 Other long term (current) drug therapy: Secondary | ICD-10-CM | POA: Insufficient documentation

## 2019-10-19 DIAGNOSIS — N179 Acute kidney failure, unspecified: Secondary | ICD-10-CM | POA: Diagnosis not present

## 2019-10-19 DIAGNOSIS — R509 Fever, unspecified: Secondary | ICD-10-CM | POA: Diagnosis present

## 2019-10-19 DIAGNOSIS — Z940 Kidney transplant status: Secondary | ICD-10-CM | POA: Diagnosis not present

## 2019-10-19 DIAGNOSIS — U071 COVID-19: Secondary | ICD-10-CM | POA: Insufficient documentation

## 2019-10-19 LAB — COMPREHENSIVE METABOLIC PANEL
ALT: 17 U/L (ref 0–44)
AST: 33 U/L (ref 15–41)
Albumin: 3.2 g/dL — ABNORMAL LOW (ref 3.5–5.0)
Alkaline Phosphatase: 84 U/L (ref 38–126)
Anion gap: 10 (ref 5–15)
BUN: 39 mg/dL — ABNORMAL HIGH (ref 6–20)
CO2: 15 mmol/L — ABNORMAL LOW (ref 22–32)
Calcium: 8.4 mg/dL — ABNORMAL LOW (ref 8.9–10.3)
Chloride: 110 mmol/L (ref 98–111)
Creatinine, Ser: 4 mg/dL — ABNORMAL HIGH (ref 0.44–1.00)
GFR calc Af Amer: 16 mL/min — ABNORMAL LOW (ref >60)
GFR calc non Af Amer: 14 mL/min — ABNORMAL LOW (ref >60)
Glucose, Bld: 93 mg/dL (ref 70–99)
Potassium: 4.4 mmol/L (ref 3.5–5.1)
Sodium: 135 mmol/L (ref 135–145)
Total Bilirubin: 0.4 mg/dL (ref 0.3–1.2)
Total Protein: 7.2 g/dL (ref 6.5–8.1)

## 2019-10-19 LAB — URINALYSIS, ROUTINE W REFLEX MICROSCOPIC
Bilirubin Urine: NEGATIVE
Glucose, UA: NEGATIVE mg/dL
Ketones, ur: NEGATIVE mg/dL
Leukocytes,Ua: NEGATIVE
Nitrite: NEGATIVE
Protein, ur: >=300 mg/dL — AB
Specific Gravity, Urine: 1.017 (ref 1.005–1.030)
pH: 5 (ref 5.0–8.0)

## 2019-10-19 LAB — CBC
HCT: 38.3 % (ref 36.0–46.0)
Hemoglobin: 11 g/dL — ABNORMAL LOW (ref 12.0–15.0)
MCH: 28.9 pg (ref 26.0–34.0)
MCHC: 28.7 g/dL — ABNORMAL LOW (ref 30.0–36.0)
MCV: 100.5 fL — ABNORMAL HIGH (ref 80.0–100.0)
Platelets: 156 10*3/uL (ref 150–400)
RBC: 3.81 MIL/uL — ABNORMAL LOW (ref 3.87–5.11)
RDW: 13.7 % (ref 11.5–15.5)
WBC: 2.3 10*3/uL — ABNORMAL LOW (ref 4.0–10.5)
nRBC: 0 % (ref 0.0–0.2)

## 2019-10-19 LAB — I-STAT BETA HCG BLOOD, ED (MC, WL, AP ONLY): I-stat hCG, quantitative: <5 m[IU]/mL (ref <5)

## 2019-10-19 LAB — LIPASE, BLOOD: Lipase: 88 U/L — ABNORMAL HIGH (ref 11–51)

## 2019-10-19 LAB — RESPIRATORY PANEL BY RT PCR (FLU A&B, COVID)
Influenza A by PCR: NEGATIVE
Influenza B by PCR: NEGATIVE
SARS Coronavirus 2 by RT PCR: POSITIVE — AB

## 2019-10-19 MED ORDER — MORPHINE SULFATE (PF) 2 MG/ML IV SOLN
2.0000 mg | Freq: Once | INTRAVENOUS | Status: AC
  Administered 2019-10-19: 21:00:00 2 mg via INTRAVENOUS
  Filled 2019-10-19: qty 1

## 2019-10-19 MED ORDER — SODIUM CHLORIDE 0.9 % IV BOLUS
1000.0000 mL | Freq: Once | INTRAVENOUS | Status: AC
  Administered 2019-10-19: 1000 mL via INTRAVENOUS

## 2019-10-19 MED ORDER — SODIUM CHLORIDE 0.9 % IV BOLUS
1000.0000 mL | Freq: Once | INTRAVENOUS | Status: AC
  Administered 2019-10-19: 21:00:00 1000 mL via INTRAVENOUS

## 2019-10-19 MED ORDER — SODIUM CHLORIDE 0.9% FLUSH: 3.0000 mL | Freq: Once | INTRAVENOUS | Status: DC

## 2019-10-19 MED ORDER — ACETAMINOPHEN 500 MG PO TABS
1000.0000 mg | ORAL_TABLET | Freq: Once | ORAL | Status: AC
  Administered 2019-10-19: 21:00:00 1000 mg via ORAL
  Filled 2019-10-19: qty 2

## 2019-10-19 NOTE — ED Triage Notes (Addendum)
Patient complains of fever and reports her BP is fluctuating and taking her meds daily. Also concerned that her hearing is going in and out. Alert and oriented, NAD. Patient also reports kidney transplant last year and no pain or concerns related to same

## 2019-10-19 NOTE — ED Notes (Signed)
[place] ([place]/Transfer to [place]) called @ [facility] [place], [place] called by Levada Dy

## 2019-10-19 NOTE — ED Provider Notes (Addendum)
[facility] EMERGENCY DEPARTMENT Provider Note   CSN: [id number] Arrival date & time: 10/19/19  2376     History No chief complaint on file.   Kerry Cook is a 34 y.o. female.  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   Patient presents today with chief complaint of intermittent fever over the past couple days.  She states that yesterday she had a temperature of 101.2 however states that she took Tylenol and that it resolved this morning.  She denies any cough cold congestion but states that she does feel somewhat short of breath.  She states that she is congested and feels that her hearing is going in and out in both ears.  She denies any ear pain or ear discharge.  Patient states that her blood pressures have been elevated significantly over the past couple days.  She states she is continuing to make urine, has some mild nausea but no vomiting.  Denies any chest pain or shortness of breath.  Denies any abdominal pain, headache, lightheadedness or dizziness.   She states that she called the kidney transplant team at [facility] and they recommended that she come to the emergency department.  She presented to the [facility] emergency department because she lives in [place].  She is requesting transfer to [place] at this time.  Second left kidney transplant was done in June 2019.  She states she had no complications since that time.  She states first kidney transplant failed because of hypertension and medical noncompliance on her part.  She states she is been compliant for blood pressure medications ever since.      Past Medical History:  Diagnosis Date  . Chronic kidney disease   . Dialysis patient ([place])   . History of kidney transplant 2012   kidney failure  due to hypertension  . Hypertension   . Multiple gastric ulcers   . Pneumonia 01/23/2015    Patient Active Problem List   Diagnosis Date Noted  . Cellulitis 04/22/2017  . Cellulitis of left upper extremity 04/22/2017  . Generalized abdominal pain   . Medicare annual wellness visit, initial 10/31/2015  . Cephalalgia   . Headache 06/07/2015  . Hypertension   . History of kidney transplant   . Anemia in chronic kidney disease 10/01/2007  . End stage renal disease on dialysis ([place]) 10/01/2007  . PAP SMEAR, LGSIL, ABNORMAL 10/01/2007  . PROTEINURIA 08/21/2006    Past Surgical History:  Procedure Laterality Date  . ESOPHAGOGASTRODUODENOSCOPY N/A 04/01/2016   Procedure: ESOPHAGOGASTRODUODENOSCOPY (EGD);  Surgeon: Gatha Mayer, MD;  Location[facility] ENDOSCOPY;  Service: Endoscopy;  Laterality: N/A;  . ESOPHAGOGASTRODUODENOSCOPY (EGD) WITH PROPOFOL N/A 11/20/2016   Procedure: ESOPHAGOGASTRODUODENOSCOPY (EGD) WITH PROPOFOL;  Surgeon: Doran Stabler, MD;  Location: [facility];  Service: Endoscopy;  Laterality: N/A;  . KIDNEY TRANSPLANT Bilateral 2012     OB History    Gravida  1   Para      Term      Preterm      AB  1   Living  0     SAB  1   TAB      Ectopic      Multiple      Live Births  Family History  Problem Relation Age of Onset  . Kidney disease Father   . Lupus Maternal Grandmother   . Cancer Maternal Grandfather   . Colon cancer Neg Hx   . Stomach cancer Neg Hx   . Rectal cancer Neg Hx   . Esophageal cancer Neg Hx   . Liver cancer Neg Hx     Social History   Tobacco Use  . Smoking status: Never Smoker  . Smokeless tobacco: Never Used  Substance Use Topics  . Alcohol use: No    Alcohol/week: 0.0 standard drinks  . Drug use: No    Home Medications Prior to Admission medications   Medication Sig Start Date End Date Taking? Authorizing Provider  amLODipine (NORVASC) 10 MG tablet Take 1 tablet (10 mg total) by mouth every  evening. Patient taking differently: Take 10 mg by mouth daily.  09/13/15  Yes [name], MD  b complex-vitamin c-folic acid (NEPHRO-VITE) 0.8 MG TABS tablet Take 1 tablet by mouth daily. 10/24/14  Yes [provider]  Cholecalciferol 25 MCG (1000 UT) tablet Take 1 tablet by mouth daily. 07/28/18  Yes [provider]  cinacalcet (SENSIPAR) 30 MG tablet Take 30 mg by mouth daily. 01/18/18  Yes [provider]  fluticasone (FLONASE) 50 MCG/ACT nasal spray Place 1 spray into both nostrils daily. 07/12/18  Yes [name], [name], PA-C  HYDROcodone-acetaminophen (NORCO/VICODIN) 5-325 MG tablet Take 1 tablet by mouth every 4 (four) hours as needed for moderate pain.  01/17/18  Yes [provider]  labetalol (NORMODYNE) 200 MG tablet Take 1 tablet (200 mg total) by mouth 2 (two) times daily. 04/02/16  Yes Elgergawy, Silver Huguenin, MD  mirtazapine (REMERON) 15 MG tablet Take 30 mg by mouth at bedtime. 09/21/19  Yes [provider]  mycophenolate (MYFORTIC) 180 MG EC tablet Take 540 mg by mouth 2 (two) times daily.  01/15/18  Yes [provider]  NIFEdipine (PROCARDIA XL/ADALAT-CC) 90 MG 24 hr tablet Take 90 mg by mouth daily. 01/14/18  Yes [provider]  oxymetazoline (AFRIN NASAL SPRAY) 0.05 % nasal spray Place 1 spray into both nostrils 2 (two) times daily. DO NOT USE MORE THAN 2-3 DAYS. 07/12/18  Yes [name], [name], PA-C  pantoprazole (PROTONIX) 40 MG tablet Take 40 mg by mouth daily. 01/15/18  Yes [provider]  potassium chloride SA (KLOR-CON) 20 MEQ tablet Take 20 mEq by mouth 2 (two) times daily. 06/30/19  Yes [provider]  predniSONE (DELTASONE) 5 MG tablet Take 10 mg by mouth daily. 01/15/18  Yes [provider]  ranitidine (ZANTAC) 150 MG tablet Take 1 tablet by mouth in the morning and at bedtime.   Yes [provider]  sodium bicarbonate 650 MG tablet Take 2 tablets by mouth in the morning and at  bedtime. 07/05/19  Yes [provider]  tamsulosin (FLOMAX) 0.4 MG CAPS capsule Take 0.4 mg by mouth 2 (two) times daily. 12/24/17  Yes [provider]  valGANciclovir (VALCYTE) 450 MG tablet Take 450 mg by mouth daily. 01/15/18  Yes [provider]    Allergies    Zosyn [piperacillin sod-tazobactam so]  Review of Systems   Review of Systems  Constitutional: Positive for fatigue and fever. Negative for chills.  HENT: Positive for hearing loss. Negative for congestion.   Eyes: Negative for pain.  Respiratory: Positive for shortness of breath. Negative for cough.   Cardiovascular: Negative for chest pain and leg swelling.  Gastrointestinal: Positive for nausea.  Negative for abdominal pain and vomiting.  Genitourinary: Negative for dysuria.  Musculoskeletal: Negative for myalgias.  Skin: Negative for rash.  Neurological: Negative for dizziness and headaches.    Physical Exam Updated Vital Signs BP (!) 124/92 (BP Location: Right Arm)   Pulse 94   Temp 99.8 F (37.7 C) (Oral)   Resp 20   Ht 5' (1.524 m)   Wt 59 kg   SpO2 99%   BMI 25.39 kg/m   Physical Exam Vitals and nursing note reviewed.  Constitutional:      General: She is not in acute distress. HENT:     Head: Normocephalic and atraumatic.     Nose: Nose normal.     Mouth/Throat:     Mouth: Mucous membranes are moist.  Eyes:     General: No scleral icterus. Cardiovascular:     Rate and Rhythm: Normal rate and regular rhythm.     Pulses: Normal pulses.     Heart sounds: Normal heart sounds.  Pulmonary:     Effort: Pulmonary effort is normal. No respiratory distress.     Breath sounds: No wheezing.  Abdominal:     Palpations: Abdomen is soft.     Tenderness: There is no abdominal tenderness. There is no right CVA tenderness, left CVA tenderness, guarding or rebound.  Musculoskeletal:     Cervical back: Normal range of motion.     Right lower leg: No edema.     Left lower leg: No edema.    Skin:    General: Skin is warm and dry.     Capillary Refill: Capillary refill takes less than 2 seconds.  Neurological:     Mental Status: She is alert. Mental status is at baseline.  Psychiatric:        Mood and Affect: Mood normal.        Behavior: Behavior normal.     ED Results / Procedures / Treatments   Labs (all labs ordered are listed, but only abnormal results are displayed) Labs Reviewed  RESPIRATORY PANEL BY RT PCR (FLU A&B, COVID) - Abnormal; Notable for the following components:      Result Value   SARS Coronavirus 2 by RT PCR POSITIVE (*)    All other components within normal limits  LIPASE, BLOOD - Abnormal; Notable for the following components:   Lipase 88 (*)    All other components within normal limits  COMPREHENSIVE METABOLIC PANEL - Abnormal; Notable for the following components:   CO2 15 (*)    BUN 39 (*)    Creatinine, Ser 4.00 (*)    Calcium 8.4 (*)    Albumin 3.2 (*)    GFR calc non Af Amer 14 (*)    GFR calc Af Amer 16 (*)    All other components within normal limits  CBC - Abnormal; Notable for the following components:   WBC 2.3 (*)    RBC 3.81 (*)    Hemoglobin 11.0 (*)    MCV 100.5 (*)    MCHC 28.7 (*)    All other components within normal limits  URINALYSIS, ROUTINE W REFLEX MICROSCOPIC - Abnormal; Notable for the following components:   Hgb urine dipstick LARGE (*)    Protein, ur >=300 (*)    Bacteria, UA RARE (*)    All other components within normal limits  I-STAT BETA HCG BLOOD, ED (MC, [facility], AP ONLY)    EKG None  Radiology DG Chest Portable 1 View  Result Date: 10/19/2019 CLINICAL DATA:  Shortness of breath. EXAM: PORTABLE CHEST 1 VIEW COMPARISON:  July 12, 2018. FINDINGS: Stable cardiomediastinal silhouette. No pneumothorax or pleural effusion is noted. Left upper lobe and bibasilar opacities are noted concerning for possible multifocal pneumonia. Bony thorax is unremarkable. IMPRESSION: Left upper lobe and bibasilar  opacities are noted concerning for possible multifocal pneumonia. Electronically Signed   By: Marijo Conception M.D.   On: 10/19/2019 13:49    Procedures .Critical Care Performed by: Tedd Sias, PA Authorized by: Tedd Sias, PA   Critical care provider statement:    Critical care time (minutes):  35   Critical care time was exclusive of:  Separately billable procedures and treating other patients and teaching time   Critical care was necessary to treat or prevent imminent or life-threatening deterioration of the following conditions:  Renal failure (Renal failure and patient with history of transplant.)   Critical care was time spent personally by me on the following activities:  Discussions with consultants, evaluation of patient's response to treatment, examination of patient, review of old charts, re-evaluation of patient's condition, pulse oximetry, ordering and review of radiographic studies, ordering and review of laboratory studies and ordering and performing treatments and interventions   I assumed direction of critical care for this patient from another provider in my specialty: no     (including critical care time)  Medications Ordered in ED Medications  sodium chloride flush (NS) 0.9 % injection 3 mL (has no administration in time range)  sodium chloride 0.9 % bolus 1,000 mL (has no administration in time range)    ED Course  I have reviewed the triage vital signs and the nursing notes.  Pertinent labs & imaging results that were available during my care of the patient were reviewed by me and considered in my medical decision making (see chart for details).  Clinical Course as of Oct 18 2005  Tue Oct 19, 2019  1439 CMP notable for BUN and creatinine elevation.  7 months ago she had a creatinine of 2.6 and today it is 4.  This is consistent with acute kidney failure potentially could be an issue with her transplant kidney.  She states that she is taking her  medications as prescribed which makes rejection less likely and her blood pressure is within normal limits.  I discussed with [facility] transport line who will accept patient.  She is Covid positive she will have accepted by nephrology but will be a medical admit with transplant team consult.   [WF]  1440 CBC notable for leukopenia which is baseline for patient.  She has mild anemia which is also chronic for her secondary to kidney disease.  No acute abnormality.  CBC(!) [WF]  1440 Mildly elevated she has no abdominal pain doubt pancreatitis. This is likely accumulating secondary to her kidney failure  Lipase, blood(!) [WF]  1441 Patient is not pregnant by hCG.  Covid test positive.  Urinalysis with significant protein large blood but rare bacteria.  Doubt kidney stone as she has no abdominal pain, negative CVA tenderness.   [WF]  2000 I personally reviewed and interpreted the patient's chest x-ray.  There are bibasilar opacities.  Patient is Covid positive suspect that this is causing her fever and shortness of breath.   [WF]    Clinical Course User Index [WF] Tedd Sias, [place]   MDM Rules/Calculators/A&P  Patient 34 year old female with history of renal transplant x2.  She is presented today with fevers, elevated blood pressure found to Covid positive but notably noted to have significantly elevated creatinine and BUN.  I discussed case with my attending physician.  We will transfer patient to [facility] per her request.  She has had her transplant surgery is done at [facility] and they will be able to follow her when she is admitted.  Stable at this time with vital signs that are reassuring.  Blood pressure within normal limits.  She is not tachycardic or hypoxic.  She will be transferred.   8:07 PM addendum: Patient personally reevaluated by myself.  She now has a temperature of 103.1.  I discussed with nursing staff they will recheck vitals and chart  this.  She will be given Tylenol, small dose of 2 mg of morphine and 1 another liter of normal saline.  Final Clinical Impression(s) / ED Diagnoses Final diagnoses:  Acute renal failure, unspecified acute renal failure type [facility])  History of kidney transplant    Rx / DC Orders ED Discharge Orders    None       Tedd Sias, [place] 10/19/19 1443    Tedd Sias, [place] 10/19/19 2007    Blanchie Dessert, MD 10/21/19 2204

## 2019-10-19 NOTE — ED Notes (Signed)
Pt transferred to [facility] via EMS. Report given to [facility] on [employer] [phone].

## 2019-10-19 NOTE — ED Notes (Signed)
[place], PA attempting to get pt accepted to [facility].

## 2019-10-20 ENCOUNTER — Telehealth: Payer: Self-pay | Admitting: Adult Health

## 2019-10-20 ENCOUNTER — Telehealth: Payer: Self-pay | Admitting: Physician Assistant

## 2019-10-20 NOTE — Telephone Encounter (Signed)
Called to discuss with patient about Covid symptoms and the use of bamlanivimab/etesevimab or casirivimab/imdevimab, a monoclonal antibody infusion for those with mild to moderate Covid symptoms and at a high risk of hospitalization.  Pt is qualified for this infusion at the [facility] infusion center due to Chronic kidney disease and immunosuppressive dz (kidney transplant)   Message left to call back  Angelena Form PA-C  [facility]

## 2019-10-20 NOTE — Telephone Encounter (Signed)
Called and LMOM to call us back regarding covid positivity and treatment with monoclonal antibody therapy.    Wilber Bihari, NP

## 2019-10-21 ENCOUNTER — Telehealth (INDEPENDENT_AMBULATORY_CARE_PROVIDER_SITE_OTHER): Payer: Self-pay | Admitting: Adult Health

## 2019-10-21 NOTE — Telephone Encounter (Signed)
Called to discuss with patient about Covid symptoms and the use of bamlanivimab/etesevimab or casirivimab/imdevimab, a monoclonal antibody infusion for those with mild to moderate Covid symptoms and at a high risk of hospitalization.  Pt is qualified for this infusion at the [facility] infusion center due to CKD and immunosuppressive disease  (kidney transplant)   Message left to call back.  Unable to send MyChart message.  [name], NP-C

## 2020-10-27 ENCOUNTER — Emergency Department (HOSPITAL_COMMUNITY)
Admission: EM | Admit: 2020-10-27 | Discharge: 2020-10-28 | Disposition: A | Discharge status: Other Acute Inpatient Hospital | Payer: Medicare Other | Attending: Emergency Medicine | Admitting: Emergency Medicine

## 2020-10-27 DIAGNOSIS — Z940 Kidney transplant status: Secondary | ICD-10-CM | POA: Diagnosis not present

## 2020-10-27 DIAGNOSIS — I120 Hypertensive chronic kidney disease with stage 5 chronic kidney disease or end stage renal disease: Secondary | ICD-10-CM | POA: Diagnosis not present

## 2020-10-27 DIAGNOSIS — R42 Dizziness and giddiness: Secondary | ICD-10-CM | POA: Insufficient documentation

## 2020-10-27 DIAGNOSIS — R110 Nausea: Secondary | ICD-10-CM | POA: Diagnosis present

## 2020-10-27 DIAGNOSIS — R5383 Other fatigue: Secondary | ICD-10-CM | POA: Diagnosis not present

## 2020-10-27 DIAGNOSIS — E875 Hyperkalemia: Secondary | ICD-10-CM | POA: Insufficient documentation

## 2020-10-27 DIAGNOSIS — Z20822 Contact with and (suspected) exposure to covid-19: Secondary | ICD-10-CM | POA: Diagnosis not present

## 2020-10-27 DIAGNOSIS — Z79899 Other long term (current) drug therapy: Secondary | ICD-10-CM | POA: Insufficient documentation

## 2020-10-27 DIAGNOSIS — N179 Acute kidney failure, unspecified: Secondary | ICD-10-CM | POA: Diagnosis not present

## 2020-10-27 DIAGNOSIS — N186 End stage renal disease: Secondary | ICD-10-CM | POA: Diagnosis not present

## 2020-10-27 DIAGNOSIS — Z992 Dependence on renal dialysis: Secondary | ICD-10-CM | POA: Insufficient documentation

## 2020-10-28 ENCOUNTER — Other Ambulatory Visit: Payer: Self-pay

## 2020-10-28 ENCOUNTER — Encounter (HOSPITAL_COMMUNITY): Payer: Self-pay | Admitting: Emergency Medicine

## 2020-10-28 DIAGNOSIS — E875 Hyperkalemia: Secondary | ICD-10-CM | POA: Diagnosis not present

## 2020-10-28 DIAGNOSIS — Z992 Dependence on renal dialysis: Secondary | ICD-10-CM | POA: Diagnosis not present

## 2020-10-28 DIAGNOSIS — R42 Dizziness and giddiness: Secondary | ICD-10-CM | POA: Diagnosis not present

## 2020-10-28 DIAGNOSIS — N179 Acute kidney failure, unspecified: Secondary | ICD-10-CM | POA: Diagnosis not present

## 2020-10-28 DIAGNOSIS — R5383 Other fatigue: Secondary | ICD-10-CM | POA: Diagnosis not present

## 2020-10-28 DIAGNOSIS — Z940 Kidney transplant status: Secondary | ICD-10-CM | POA: Diagnosis not present

## 2020-10-28 DIAGNOSIS — R110 Nausea: Secondary | ICD-10-CM | POA: Diagnosis present

## 2020-10-28 DIAGNOSIS — I120 Hypertensive chronic kidney disease with stage 5 chronic kidney disease or end stage renal disease: Secondary | ICD-10-CM | POA: Diagnosis not present

## 2020-10-28 DIAGNOSIS — N186 End stage renal disease: Secondary | ICD-10-CM | POA: Diagnosis not present

## 2020-10-28 DIAGNOSIS — Z20822 Contact with and (suspected) exposure to covid-19: Secondary | ICD-10-CM | POA: Diagnosis not present

## 2020-10-28 DIAGNOSIS — Z79899 Other long term (current) drug therapy: Secondary | ICD-10-CM | POA: Diagnosis not present

## 2020-10-28 LAB — COMPREHENSIVE METABOLIC PANEL
ALT: 8 U/L (ref 0–44)
ALT: 7 U/L (ref 0–44)
AST: 13 U/L — ABNORMAL LOW (ref 15–41)
AST: 18 U/L (ref 15–41)
Albumin: 3.6 g/dL (ref 3.5–5.0)
Albumin: 3.3 g/dL — ABNORMAL LOW (ref 3.5–5.0)
Alkaline Phosphatase: 118 U/L (ref 38–126)
Alkaline Phosphatase: 111 U/L (ref 38–126)
Anion gap: 21 — ABNORMAL HIGH (ref 5–15)
Anion gap: 16 — ABNORMAL HIGH (ref 5–15)
BUN: 146 mg/dL — ABNORMAL HIGH (ref 6–20)
BUN: 67 mg/dL — ABNORMAL HIGH (ref 6–20)
CO2: 9 mmol/L — ABNORMAL LOW (ref 22–32)
CO2: 19 mmol/L — ABNORMAL LOW (ref 22–32)
Calcium: 7.1 mg/dL — ABNORMAL LOW (ref 8.9–10.3)
Calcium: 8.2 mg/dL — ABNORMAL LOW (ref 8.9–10.3)
Chloride: 98 mmol/L (ref 98–111)
Chloride: 98 mmol/L (ref 98–111)
Creatinine, Ser: 26.89 mg/dL — ABNORMAL HIGH (ref 0.44–1.00)
Creatinine, Ser: 16.11 mg/dL — ABNORMAL HIGH (ref 0.44–1.00)
GFR, Estimated: 3 mL/min — ABNORMAL LOW (ref >60)
Glucose, Bld: 102 mg/dL — ABNORMAL HIGH (ref 70–99)
Glucose, Bld: 98 mg/dL (ref 70–99)
Potassium: >7.5 mmol/L (ref 3.5–5.1)
Potassium: 4.3 mmol/L (ref 3.5–5.1)
Sodium: 128 mmol/L — ABNORMAL LOW (ref 135–145)
Sodium: 133 mmol/L — ABNORMAL LOW (ref 135–145)
Total Bilirubin: 0.5 mg/dL (ref 0.3–1.2)
Total Bilirubin: 1 mg/dL (ref 0.3–1.2)
Total Protein: 8.2 g/dL — ABNORMAL HIGH (ref 6.5–8.1)
Total Protein: 7.7 g/dL (ref 6.5–8.1)

## 2020-10-28 LAB — CBC WITH DIFFERENTIAL/PLATELET
Abs Immature Granulocytes: 0.03 10*3/uL (ref 0.00–0.07)
Basophils Absolute: 0.1 10*3/uL (ref 0.0–0.1)
Basophils Relative: 1 %
Eosinophils Absolute: 0.5 10*3/uL (ref 0.0–0.5)
Eosinophils Relative: 5 %
HCT: 26.5 % — ABNORMAL LOW (ref 36.0–46.0)
Hemoglobin: 8.2 g/dL — ABNORMAL LOW (ref 12.0–15.0)
Immature Granulocytes: 0 %
Lymphocytes Relative: 9 %
Lymphs Abs: 0.8 10*3/uL (ref 0.7–4.0)
MCH: 29.1 pg (ref 26.0–34.0)
MCHC: 30.9 g/dL (ref 30.0–36.0)
MCV: 94 fL (ref 80.0–100.0)
Monocytes Absolute: 0.3 10*3/uL (ref 0.1–1.0)
Monocytes Relative: 3 %
Neutro Abs: 7.6 10*3/uL (ref 1.7–7.7)
Neutrophils Relative %: 82 %
Platelets: 270 10*3/uL (ref 150–400)
RBC: 2.82 MIL/uL — ABNORMAL LOW (ref 3.87–5.11)
RDW: 15.1 % (ref 11.5–15.5)
WBC: 9.2 10*3/uL (ref 4.0–10.5)
nRBC: 0 % (ref 0.0–0.2)

## 2020-10-28 LAB — CBG MONITORING, ED: Glucose-Capillary: 202 mg/dL — ABNORMAL HIGH (ref 70–99)

## 2020-10-28 LAB — POTASSIUM: Potassium: 3.5 mmol/L (ref 3.5–5.1)

## 2020-10-28 LAB — HEPATITIS B SURFACE ANTIGEN: Hepatitis B Surface Ag: NONREACTIVE

## 2020-10-28 LAB — HEPATITIS B SURFACE ANTIBODY,QUALITATIVE: Hep B S Ab: REACTIVE — AB

## 2020-10-28 LAB — RESP PANEL BY RT-PCR (FLU A&B, COVID) ARPGX2
Influenza A by PCR: NEGATIVE
Influenza B by PCR: NEGATIVE
SARS Coronavirus 2 by RT PCR: NEGATIVE

## 2020-10-28 MED ORDER — METOCLOPRAMIDE HCL 5 MG/ML IJ SOLN
5.0000 mg | Freq: Once | INTRAMUSCULAR | Status: DC
  Filled 2020-10-28: qty 2

## 2020-10-28 MED ORDER — CALCIUM GLUCONATE-NACL 2-0.675 GM/100ML-% IV SOLN
2.0000 g | Freq: Once | INTRAVENOUS | Status: DC
  Filled 2020-10-28: qty 100

## 2020-10-28 MED ORDER — AMLODIPINE BESYLATE 5 MG PO TABS: 10.0000 mg | ORAL_TABLET | Freq: Once | ORAL | Status: DC

## 2020-10-28 MED ORDER — ALBUTEROL SULFATE (2.5 MG/3ML) 0.083% IN NEBU
10.0000 mg | INHALATION_SOLUTION | Freq: Once | RESPIRATORY_TRACT | Status: AC
  Administered 2020-10-28: 10 mg via RESPIRATORY_TRACT
  Filled 2020-10-28: qty 12

## 2020-10-28 MED ORDER — LABETALOL HCL 5 MG/ML IV SOLN
10.0000 mg | Freq: Once | INTRAVENOUS | Status: DC
  Filled 2020-10-28: qty 4

## 2020-10-28 MED ORDER — LABETALOL HCL 5 MG/ML IV SOLN
20.0000 mg | Freq: Once | INTRAVENOUS | Status: AC
  Administered 2020-10-28: 20 mg via INTRAVENOUS
  Filled 2020-10-28: qty 4

## 2020-10-28 MED ORDER — SODIUM CHLORIDE 0.9 % IV SOLN
1.0000 g | Freq: Once | INTRAVENOUS | Status: AC
  Administered 2020-10-28: 1 g via INTRAVENOUS
  Filled 2020-10-28: qty 10

## 2020-10-28 MED ORDER — ONDANSETRON HCL 4 MG/2ML IJ SOLN
4.0000 mg | Freq: Once | INTRAMUSCULAR | Status: AC
  Administered 2020-10-28: 4 mg via INTRAVENOUS
  Filled 2020-10-28: qty 2

## 2020-10-28 MED ORDER — CARVEDILOL 12.5 MG PO TABS
25.0000 mg | ORAL_TABLET | Freq: Two times a day (BID) | ORAL | Status: DC
  Administered 2020-10-28: 25 mg via ORAL
  Filled 2020-10-28: qty 2

## 2020-10-28 MED ORDER — SODIUM ZIRCONIUM CYCLOSILICATE 10 G PO PACK
10.0000 g | PACK | ORAL | Status: AC
  Administered 2020-10-28: 10 g via ORAL
  Filled 2020-10-28: qty 1

## 2020-10-28 MED ORDER — ACETAMINOPHEN 500 MG PO TABS
1000.0000 mg | ORAL_TABLET | Freq: Once | ORAL | Status: AC
  Administered 2020-10-28: 1000 mg via ORAL
  Filled 2020-10-28: qty 2

## 2020-10-28 MED ORDER — DEXTROSE 50 % IV SOLN
1.0000 | Freq: Once | INTRAVENOUS | Status: AC
  Administered 2020-10-28: 50 mL via INTRAVENOUS
  Filled 2020-10-28: qty 50

## 2020-10-28 MED ORDER — INSULIN ASPART 100 UNIT/ML IV SOLN
5.0000 [IU] | Freq: Once | INTRAVENOUS | Status: AC
  Administered 2020-10-28: 5 [IU] via INTRAVENOUS

## 2020-10-28 MED ORDER — DIPHENHYDRAMINE HCL 50 MG/ML IJ SOLN
12.5000 mg | Freq: Once | INTRAMUSCULAR | Status: DC
  Filled 2020-10-28: qty 1

## 2020-10-28 NOTE — Procedures (Signed)
I was present at this dialysis session. I have reviewed the session itself and made appropriate changes.   AVF cannultion w/o difficulty, Tol HD at this time. 1K.  Low Qb and DFR given severe azotemia.   Filed Weights    Recent Labs  Lab 10/28/20 0012  NA 128*  K >7.5*  CL 98  CO2 9*  GLUCOSE 102*  BUN 146*  CREATININE 26.89*  CALCIUM 7.1*    Recent Labs  Lab 10/28/20 0012  WBC 9.2  NEUTROABS 7.6  HGB 8.2*  HCT 26.5*  MCV 94.0  PLT 270    Scheduled Meds: Continuous Infusions: . calcium gluconate     PRN Meds:.   Pearson Grippe  MD 10/28/2020, 7:54 AM

## 2020-10-28 NOTE — ED Notes (Signed)
Pt dry heaving and nauseous. Pt provided with emesis bag. Notified MD for PRN nausea meds. Will await further orders.

## 2020-10-28 NOTE — ED Notes (Signed)
Pt to dialysis at this time. Belongings with patient.

## 2020-10-28 NOTE — ED Provider Notes (Addendum)
Plan is to admit to [facility] after return from dialysis. Physical Exam  BP (!) 142/89 (BP Location: Right Arm)   Pulse 95   Temp 98.3 F (36.8 C) (Oral)   Resp 20   SpO2 98%   Physical Exam  ED Course/Procedures     Procedures  MDM  Patient has returned from dialysis.  She is alert in no distress.  No respiratory distress.  Consult: Have talked to [facility], universal exceptor for [facility] admissions.  Anticipate a medical excepting with bed assignment in the next 30 minutes to an hour.  Request repeat EKG and potassium level.   14:16 I have reviewed the case with Dawn.  She has spoken to the hospitalist team and they are requesting a complete metabolic panel repeat vital signs and EKG.  They are concerned about bringing her to a floor unmonitored bed given her prior hyperkalemia and acute renal failure.  There are currently no monitored beds available at [facility].  Repeat potassium 3.6      Charlesetta Shanks, MD 10/28/20 [place]    Charlesetta Shanks, MD 10/28/20 1510

## 2020-10-28 NOTE — ED Triage Notes (Signed)
Patient reports dizziness/nausea this evening , hypertensive at triage , denies fever or chills .

## 2020-10-28 NOTE — ED Provider Notes (Addendum)
[facility] EMERGENCY DEPARTMENT Provider Note   CSN: [id number] Arrival date & time: 10/27/20  2307     History Chief Complaint  Patient presents with  . Hypertension    Dizzy BP= 208/118    Kerry Cook is a 36 y.o. female.  Patient presents to the emergency department for evaluation of headache, dizziness, nausea and generalized fatigue.  She has vomited prior to arrival.  No abdominal pain.  Symptoms began sometime today.  Patient noted to be very hypertensive at arrival.  She does have a history of hypertension, reports that she has taken all of her medications except for her evening doses.        Past Medical History:  Diagnosis Date  . Chronic kidney disease   . Dialysis patient ([place])   . History of kidney transplant 2012   kidney failure due to hypertension  . Hypertension   . Multiple gastric ulcers   . Pneumonia 01/23/2015    Patient Active Problem List   Diagnosis Date Noted  . Cellulitis 04/22/2017  . Cellulitis of left upper extremity 04/22/2017  . Generalized abdominal pain   . Medicare annual wellness visit, initial 10/31/2015  . Cephalalgia   . Headache 06/07/2015  . Hypertension   . History of kidney transplant   . Anemia in chronic kidney disease 10/01/2007  . End stage renal disease on dialysis ([place]) 10/01/2007  . PAP SMEAR, LGSIL, ABNORMAL 10/01/2007  . PROTEINURIA 08/21/2006    Past Surgical History:  Procedure Laterality Date  . ESOPHAGOGASTRODUODENOSCOPY N/A 04/01/2016   Procedure: ESOPHAGOGASTRODUODENOSCOPY (EGD);  Surgeon: Gatha Mayer, MD;  Location: [facility] ENDOSCOPY;  Service: Endoscopy;  Laterality: N/A;  . ESOPHAGOGASTRODUODENOSCOPY (EGD) WITH PROPOFOL N/A 11/20/2016   Procedure: ESOPHAGOGASTRODUODENOSCOPY (EGD) WITH PROPOFOL;  Surgeon: Doran Stabler, MD;  Location: [place];  Service: Endoscopy;  Laterality: N/A;  . KIDNEY TRANSPLANT Bilateral 2012     OB History    Gravida  1   Para      Term       Preterm      AB  1   Living  0     SAB  1   IAB      Ectopic      Multiple      Live Births              Family History  Problem Relation Age of Onset  . Kidney disease Father   . Lupus Maternal Grandmother   . Cancer Maternal Grandfather   . Colon cancer Neg Hx   . Stomach cancer Neg Hx   . Rectal cancer Neg Hx   . Esophageal cancer Neg Hx   . Liver cancer Neg Hx     Social History   Tobacco Use  . Smoking status: Never Smoker  . Smokeless tobacco: Never Used  Vaping Use  . Vaping Use: Never used  Substance Use Topics  . Alcohol use: No    Alcohol/week: 0.0 standard drinks  . Drug use: No    Home Medications Prior to Admission medications   Medication Sig Start Date End Date Taking? Authorizing Provider  amLODipine (NORVASC) 10 MG tablet Take 1 tablet (10 mg total) by mouth every evening. Patient taking differently: Take 10 mg by mouth daily. 09/13/15  Yes [name], MD  b complex-vitamin c-folic acid (NEPHRO-VITE) 0.8 MG TABS tablet Take 1 tablet by mouth daily. 10/24/14  Yes [provider]  calcitRIOL (ROCALTROL) 0.25 MCG capsule  Take 0.25 mcg by mouth daily. 08/03/20  Yes [provider]  carvedilol (COREG) 25 MG tablet Take 25 mg by mouth 2 (two) times daily. 08/28/20  Yes [provider]  Cholecalciferol 25 MCG (1000 UT) tablet Take 1 tablet by mouth daily. 07/28/18  Yes [provider]  cinacalcet (SENSIPAR) 30 MG tablet Take 30 mg by mouth daily. 01/18/18  Yes [provider]  cyanocobalamin 1000 MCG tablet Take 1,000 mcg by mouth daily.   Yes [provider]  fluticasone (FLONASE) 50 MCG/ACT nasal spray Place 1 spray into both nostrils daily. Patient taking differently: Place 1 spray into both nostrils daily as needed for allergies. 07/12/18  Yes [name], [name], PA-C  HYDROcodone-acetaminophen (NORCO/VICODIN) 5-325 MG tablet Take 1 tablet by mouth every 4 (four) hours as needed for moderate  pain.  01/17/18  Yes [provider]  labetalol (NORMODYNE) 200 MG tablet Take 1 tablet (200 mg total) by mouth 2 (two) times daily. 04/02/16  Yes Elgergawy, Silver Huguenin, MD  mirtazapine (REMERON) 15 MG tablet Take 30 mg by mouth at bedtime. 09/21/19  Yes [provider]  mycophenolate (MYFORTIC) 180 MG EC tablet Take 540 mg by mouth 2 (two) times daily.  01/15/18  Yes [provider]  NIFEdipine (PROCARDIA XL/ADALAT-CC) 90 MG 24 hr tablet Take 90 mg by mouth daily. 01/14/18  Yes [provider]  oxymetazoline (AFRIN NASAL SPRAY) 0.05 % nasal spray Place 1 spray into both nostrils 2 (two) times daily. DO NOT USE MORE THAN 2-3 DAYS. 07/12/18  Yes [name], [name], PA-C  pantoprazole (PROTONIX) 40 MG tablet Take 40 mg by mouth daily. 01/15/18  Yes [provider]  potassium chloride SA (KLOR-CON) 20 MEQ tablet Take 20 mEq by mouth 2 (two) times daily. 06/30/19  Yes [provider]  predniSONE (DELTASONE) 5 MG tablet Take 10 mg by mouth daily. 01/15/18  Yes [provider]  sodium bicarbonate 650 MG tablet Take 2 tablets by mouth in the morning and at bedtime. 07/05/19  Yes [provider]  tamsulosin (FLOMAX) 0.4 MG CAPS capsule Take 0.4 mg by mouth 2 (two) times daily. 12/24/17  Yes [provider]  valGANciclovir (VALCYTE) 450 MG tablet Take 450 mg by mouth daily. 01/15/18  Yes [provider]    Allergies    Zosyn [piperacillin sod-tazobactam so]  Review of Systems   Review of Systems  Constitutional: Positive for fatigue.  Gastrointestinal: Positive for nausea and vomiting.  Neurological: Positive for dizziness and headaches.    Physical Exam Updated Vital Signs BP (!) 159/103   Pulse 85   Temp 98.6 F (37 C) (Oral)   Resp 18   SpO2 97%   Physical Exam Vitals and nursing note reviewed.  Constitutional:      General: She is not in acute distress.    Appearance: Normal appearance. She is well-developed.   HENT:     Head: Normocephalic and atraumatic.     Right Ear: Hearing normal.     Left Ear: Hearing normal.     Nose: Nose normal.  Eyes:     Conjunctiva/sclera: Conjunctivae normal.     Pupils: Pupils are equal, round, and reactive to light.  Cardiovascular:     Rate and Rhythm: Regular rhythm.     Heart sounds: S1 normal and S2 normal. No murmur heard. No friction rub. No gallop.   Pulmonary:     Effort: Pulmonary effort is normal. No respiratory distress.  Breath sounds: Normal breath sounds.  Chest:     Chest wall: No tenderness.  Abdominal:     General: Bowel sounds are normal.     Palpations: Abdomen is soft.     Tenderness: There is no abdominal tenderness. There is no guarding or rebound. Negative signs include Murphy's sign and McBurney's sign.     Hernia: No hernia is present.  Musculoskeletal:        General: Normal range of motion.     Cervical back: Normal range of motion and neck supple.  Skin:    General: Skin is warm and dry.     Findings: No rash.  Neurological:     Mental Status: She is alert and oriented to person, place, and time.     GCS: GCS eye subscore is 4. GCS verbal subscore is 5. GCS motor subscore is 6.     Cranial Nerves: No cranial nerve deficit.     Sensory: No sensory deficit.     Coordination: Coordination normal.  Psychiatric:        Speech: Speech normal.        Behavior: Behavior normal.        Thought Content: Thought content normal.     ED Results / Procedures / Treatments   Labs (all labs ordered are listed, but only abnormal results are displayed) Labs Reviewed  CBC WITH DIFFERENTIAL/PLATELET - Abnormal; Notable for the following components:      Result Value   RBC 2.82 (*)    Hemoglobin 8.2 (*)    HCT 26.5 (*)    All other components within normal limits  COMPREHENSIVE METABOLIC PANEL - Abnormal; Notable for the following components:   Sodium 128 (*)    Potassium >7.5 (*)    CO2 9 (*)    Glucose, Bld 102 (*)    BUN  146 (*)    Creatinine, Ser 26.89 (*)    Calcium 7.1 (*)    Total Protein 8.2 (*)    AST 13 (*)    Anion gap 21 (*)    All other components within normal limits  CBG MONITORING, ED - Abnormal; Notable for the following components:   Glucose-Capillary 202 (*)    All other components within normal limits  RESP PANEL BY RT-PCR (FLU A&B, COVID) [id number]    EKG EKG Interpretation  Date/Time:  Saturday Oct 28 2020 01:57:26 EDT Ventricular Rate:  82 PR Interval:  205 QRS Duration: 125 QT Interval:  444 QTC Calculation: 519 R Axis:   -41 Text Interpretation: Sinus rhythm Borderline prolonged PR interval IVCD, consider atypical RBBB Left ventricular hypertrophy Anterolateral Q wave, probably normal for age Abnrm T, consider ischemia, anterolateral lds ST elevation, consider inferior injury peaked T waves Confirmed by Orpah Greek [phone]) on 10/28/2020 2:31:26 AM   Radiology No results found.  Procedures Procedures   Medications Ordered in ED Medications  calcium chloride 1 g in sodium chloride 0.9 % 100 mL IVPB (0 g Intravenous Stopped 10/28/20 0353)  albuterol (PROVENTIL) (2.5 MG/3ML) 0.083% nebulizer solution 10 mg (10 mg Nebulization Given 10/28/20 0209)  insulin aspart (novoLOG) injection 5 Units (5 Units Intravenous Given 10/28/20 0208)    And  dextrose 50 % solution 50 mL (50 mLs Intravenous Given 10/28/20 0207)  sodium zirconium cyclosilicate (LOKELMA) packet 10 g (10 g Oral Given 10/28/20 0208)  labetalol (NORMODYNE) injection 20 mg (20 mg Intravenous Given 10/28/20 0208)    ED Course  I have reviewed the triage vital  signs and the nursing notes.  Pertinent labs & imaging results that were available during my care of the patient were reviewed by me and considered in my medical decision making (see chart for details).    MDM Rules/Calculators/A&P                          Patient presents to the emergency department with generalized fatigue, headache, nausea, vomiting.   Patient has history of renal transplant.  She has actually had 2 separate transplants.  Her most recent transplant was at [facility] in 2019.  Looking through her records, she has had slow increase of her creatinine over the last year.  Creatinine was 4 when she was last checked at [facility] about a month ago.  She has found to be in acute renal failure today.  BUN is 140, creatinine 26.  She has hyperkalemia, potassium greater than 7.5.  EKG without rhythm disturbance or QRS widening but she does have peaked T waves.  Hyperkalemia treated with calcium, insulin, albuterol, Lokelma.  Discussed with Dr. Ulice Dash, transplant service at [facility].  Recommended that patient be placed on nephrology or medicine service.  Discussed with Dr. Alice Rieger, hospitalist service.  Further discussed with Dr. Sander Nephew, on-call for nephrology.  Current recommendations are for emergent dialysis here followed by transfer to [facility].  Discussed with Dr. Joelyn Oms, on-call for nephrology at [facility].  Will see the patient in the ER and arrange for dialysis.  Addendum: Patient will go for dialysis at 7 AM.  Will be returned to the emergency department.  At that time, ER physician is to [facility] for transfer.  CRITICAL CARE Performed by: Orpah Greek   Total critical care time: 45 minutes  Critical care time was exclusive of separately billable procedures and treating other patients.  Critical care was necessary to treat or prevent imminent or life-threatening deterioration.  Critical care was time spent personally by me on the following activities: development of treatment plan with patient and/or surrogate as well as nursing, discussions with consultants, evaluation of patient's response to treatment, examination of patient, obtaining history from patient or surrogate, ordering and performing treatments and interventions, ordering and review of laboratory studies, ordering and review of radiographic studies,  pulse oximetry and re-evaluation of patient's condition.  Final Clinical Impression(s) / ED Diagnoses Final diagnoses:  Acute renal failure, unspecified acute renal failure type [facility])    Rx / DC Orders ED Discharge Orders    None       Akil Hoos, Gwenyth Allegra, MD 10/28/20 [id number]    Orpah Greek, MD 10/28/20 [phone]

## 2020-10-28 NOTE — Consult Note (Signed)
Kerry Cook Admit Date: 10/27/2020 10/28/2020 Kerry Cook Requesting Physician:  Kerry Holiday MD  Reason for Consult:  Hyperkalemia, Renal Failure, Uremia, kidney transplant patient  HPI:  [id number] complicated PMH including second kidney transplant 12/14/2017 at [facility].  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  This failed in 2012 and she received hemodialysis using a left forearm AV fistula before the second transplant.  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  Immunosuppression is belatacept, mycophenolate, prednisone.  She presented to the ED overnight with nausea, vomiting, anorexia, malaise.  She was very hypertensive at arrival and labs demonstrated severe renal failure with creatinine of 27, BUN 146, potassium greater than 7.5, serum bicarbonate of 9 with anion gap of at least 21.  EKG demonstrated peaked T waves.  Treatment in the ED including insulin/dextrose, Lokelma 10 g, calcium chloride 1 g, albuterol.  Patient is currently resting, blood pressures have improved with parenteral labetalol.  She denies missing more than a dose or 2 of her prednisone and mycophenolate.  No diarrhea.  No fevers or chills.  No exposure to antibiotics, NSAIDs, IV contrast.   Creatinine, Ser (mg/dL)  Date Value  10/28/2020 26.89 (H)  10/19/2019 4.00 (H)  03/22/2019 2.62 (H)  09/08/2017 7.30 (H)  04/23/2017 12.32 (H)  04/22/2017 8.29 (H)  11/19/2016 10.86 (H)  11/18/2016 12.97 (H)  11/18/2016 12.76 (H)  11/17/2016 11.74 (H)  ]  ROS Balance of 12 systems is negative w/ exceptions as above  PMH  Past Medical History:  Diagnosis Date  . Chronic kidney disease   . Dialysis patient ([place])   . History of kidney transplant 2012   kidney failure due to hypertension  .  Hypertension   . Multiple gastric ulcers   . Pneumonia 01/23/2015   PSH  Past Surgical History:  Procedure Laterality Date  . ESOPHAGOGASTRODUODENOSCOPY N/A 04/01/2016   Procedure: ESOPHAGOGASTRODUODENOSCOPY (EGD);  Surgeon: Gatha Mayer, MD;  Location: [facility] ENDOSCOPY;  Service: Endoscopy;  Laterality: N/A;  . ESOPHAGOGASTRODUODENOSCOPY (EGD) WITH PROPOFOL N/A 11/20/2016   Procedure: ESOPHAGOGASTRODUODENOSCOPY (EGD) WITH PROPOFOL;  Surgeon: Doran Stabler, MD;  Location: [place];  Service: Endoscopy;  Laterality: N/A;  . KIDNEY TRANSPLANT Bilateral 2012   FH  Family History  Problem Relation Age of Onset  . Kidney disease Father   . Lupus Maternal Grandmother   . Cancer Maternal Grandfather   . Colon cancer Neg Hx   . Stomach cancer Neg Hx   . Rectal cancer Neg Hx   . Esophageal cancer Neg Hx   . Liver cancer Neg Hx    SH  reports that she has never smoked. She has never used smokeless tobacco. She reports that she does not drink alcohol and does not use drugs. Allergies  Allergies  Allergen Reactions  . Zosyn [Piperacillin Sod-Tazobactam So] Itching    Has patient had a PCN reaction causing immediate rash, facial/tongue/throat swelling, SOB or lightheadedness with hypotension: No Has patient had a PCN reaction causing severe rash involving mucus membranes or skin necrosis: No Has patient had a PCN reaction that required hospitalization: No Has patient had a PCN reaction occurring within the last 10 years: No If all of the above answers are "NO", then may proceed with Cephalosporin use.    Home medications Prior to Admission medications   Medication  Sig Start Date End Date Taking? Authorizing Provider  amLODipine (NORVASC) 10 MG tablet Take 1 tablet (10 mg total) by mouth every evening. Patient taking differently: Take 10 mg by mouth daily. 09/13/15  Yes [name], MD  b complex-vitamin c-folic acid (NEPHRO-VITE) 0.8 MG TABS tablet Take 1 tablet by mouth daily.  10/24/14  Yes [provider]  calcitRIOL (ROCALTROL) 0.25 MCG capsule Take 0.25 mcg by mouth daily. 08/03/20  Yes [provider]  carvedilol (COREG) 25 MG tablet Take 25 mg by mouth 2 (two) times daily. 08/28/20  Yes [provider]  Cholecalciferol 25 MCG (1000 UT) tablet Take 1 tablet by mouth daily. 07/28/18  Yes [provider]  cinacalcet (SENSIPAR) 30 MG tablet Take 30 mg by mouth daily. 01/18/18  Yes [provider]  cyanocobalamin 1000 MCG tablet Take 1,000 mcg by mouth daily.   Yes [provider]  fluticasone (FLONASE) 50 MCG/ACT nasal spray Place 1 spray into both nostrils daily. Patient taking differently: Place 1 spray into both nostrils daily as needed for allergies. 07/12/18  Yes [name], [name], PA-C  HYDROcodone-acetaminophen (NORCO/VICODIN) 5-325 MG tablet Take 1 tablet by mouth every 4 (four) hours as needed for moderate pain.  01/17/18  Yes [provider]  labetalol (NORMODYNE) 200 MG tablet Take 1 tablet (200 mg total) by mouth 2 (two) times daily. 04/02/16  Yes Elgergawy, Silver Huguenin, MD  mirtazapine (REMERON) 15 MG tablet Take 30 mg by mouth at bedtime. 09/21/19  Yes [provider]  mycophenolate (MYFORTIC) 180 MG EC tablet Take 540 mg by mouth 2 (two) times daily.  01/15/18  Yes [provider]  NIFEdipine (PROCARDIA XL/ADALAT-CC) 90 MG 24 hr tablet Take 90 mg by mouth daily. 01/14/18  Yes [provider]  oxymetazoline (AFRIN NASAL SPRAY) 0.05 % nasal spray Place 1 spray into both nostrils 2 (two) times daily. DO NOT USE MORE THAN 2-3 DAYS. 07/12/18  Yes [name], [name], PA-C  pantoprazole (PROTONIX) 40 MG tablet Take 40 mg by mouth daily. 01/15/18  Yes [provider]  potassium chloride SA (KLOR-CON) 20 MEQ tablet Take 20 mEq by mouth 2 (two) times daily. 06/30/19  Yes [provider]  predniSONE (DELTASONE) 5 MG tablet Take 10 mg by mouth daily. 01/15/18  Yes [provider]  sodium bicarbonate 650 MG tablet Take 2 tablets by mouth in the morning and at bedtime. 07/05/19  Yes [provider]  tamsulosin (FLOMAX) 0.4 MG CAPS capsule Take 0.4 mg by mouth 2 (two) times daily. 12/24/17  Yes [provider]  valGANciclovir (VALCYTE) 450 MG tablet Take 450 mg by mouth daily. 01/15/18  Yes [provider]     CBC Recent Labs  Lab 10/28/20 0012  WBC 9.2  NEUTROABS 7.6  HGB 8.2*  HCT 26.5*  MCV 94.0  PLT [id number]   Basic Metabolic Panel Recent Labs  Lab 10/28/20 0012  NA 128*  K >7.5*  CL 98  CO2 9*  GLUCOSE 102*  BUN 146*  CREATININE 26.89*  CALCIUM 7.1*    Physical Exam  Blood pressure (!) 159/103, pulse 85, temperature 98.6 F (37 C), temperature source Oral, resp. rate 18, SpO2 97 %. GEN: Resting comfortably, NAD ENT: NCAT EYES: EOMI CV: Regular, no rub, normal S1 and S2 PULM: Clear bilaterally, normal work of breathing ABD: Soft.  Focally tender over left lower abdominal where most recent kidney transplant is located SKIN: No rashes or lesions EXT: No peripheral edema VASCULAR:  Left forearm AV fistula with bruit and thrill   Assessment 56F s/p ddKT 2019 at [facility] on Belatacept/MMF/Pred and progressive GFR decline presenting with acute renal failure, severe azotemia, hyperkalemia, metabolic acidosis.  1. AoCKD4-T: This might represent progression to ESRD given advanced CKD at baseline.  Agree with transfer to [facility] to sort this out.  Needs HD here prior to transfer for stability: 1K, 2.5h, 250/500, 1L UF max, no heparin 2. Hyperkalemia: Likely from #1 HD first call this morning, temporized/medically addressed in the ED, will give additional calcium at the current time 3. Metabolic acidosis, acute on chronic: HD as above 4. Chronic immunosuppression 5. Acute hypertension: History of transplanted renal artery stenosis, will need evaluation after stability, improved with labetalol, follow with dialysis 6. Anemia,  hemoglobin 8.2, normocytic: Trend for now, transfuse as needed 7. Mature and usable left forearm AV fistula  Plan 1. HD as above 2. Plan to return to the ED after dialysis and transfer to [facility] for additional care   Kerry Cook  10/28/2020, 5:32 AM

## 2020-10-28 NOTE — ED Provider Notes (Signed)
I assumed care of this patient from Dr. Vallery Ridge pending reassessment of an EKG and potassium level prior to anticipated transfer to [place].  Repeat EKG shows improvement of hyperkalemic peaked T waves, flatter now, with a normal sinus rhythm and a heart rate of 96, QTC in the 480s, no acute ischemic findings, chronic T wave inversions noted in lateral leads from prior EKG.  Blood pressure also improved, currently 142/99, HR 92, remains afebrile and 97% oxygen saturation.  Vitals are stable for transfer.  Repeat potassium and BMP reviewed with [facility] accepter, and showing improved and stable levels.  Accepting physician Dr Joan Flores.  Patient to be transferred.   Wyvonnia Dusky, MD 10/29/20 [phone]

## 2020-10-30 LAB — HEPATITIS B SURFACE ANTIBODY, QUANTITATIVE: Hep B S AB Quant (Post): 320.5 m[IU]/mL (ref >9.9)

## 2020-11-22 ENCOUNTER — Other Ambulatory Visit: Payer: Self-pay

## 2020-11-22 ENCOUNTER — Non-Acute Institutional Stay (HOSPITAL_COMMUNITY)
Admission: RE | Admit: 2020-11-22 | Discharge: 2020-11-22 | Disposition: A | Discharge status: Home / Self Care | Payer: Medicare Other | Source: Ambulatory Visit | Attending: Internal Medicine | Admitting: Internal Medicine

## 2020-11-22 DIAGNOSIS — D631 Anemia in chronic kidney disease: Secondary | ICD-10-CM | POA: Diagnosis present

## 2020-11-22 DIAGNOSIS — N189 Chronic kidney disease, unspecified: Secondary | ICD-10-CM | POA: Insufficient documentation

## 2020-11-22 LAB — PREPARE RBC (CROSSMATCH)

## 2020-11-22 MED ORDER — SODIUM CHLORIDE 0.9% IV SOLUTION: Freq: Once | INTRAVENOUS | Status: AC

## 2020-11-22 NOTE — Progress Notes (Signed)
PATIENT CARE CENTER NOTE  Diagnosis:  Anemia in chronic kidney disease- D63.1   Provider: Corliss Parish, MD   Procedure: Blood transfusion    Note: Type & Screen drawn and patient received 1 unit PRBC via PIV. Patient tolerated well with no adverse reaction. Vital signs stable. AVS offered but patient refused. Patient alert, oriented and ambulatory at discharge.

## 2020-11-22 NOTE — Discharge Instructions (Signed)

## 2020-11-23 LAB — TYPE AND SCREEN
ABO/RH(D): O POS
Antibody Screen: POSITIVE
DAT, IgG: NEGATIVE
Donor AG Type: NEGATIVE
Unit division: 0

## 2020-11-23 LAB — BPAM RBC
Blood Product Expiration Date: 202206292359
ISSUE DATE / TIME: 202206011405
Unit Type and Rh: 5100

## 2020-12-11 ENCOUNTER — Other Ambulatory Visit: Payer: Self-pay

## 2020-12-11 ENCOUNTER — Encounter: Payer: Self-pay | Admitting: Obstetrics

## 2020-12-11 ENCOUNTER — Ambulatory Visit (INDEPENDENT_AMBULATORY_CARE_PROVIDER_SITE_OTHER): Payer: Medicare Other | Admitting: Obstetrics

## 2020-12-11 ENCOUNTER — Other Ambulatory Visit (HOSPITAL_COMMUNITY)
Admission: RE | Admit: 2020-12-11 | Discharge: 2020-12-11 | Disposition: A | Discharge status: Home / Self Care | Payer: Medicare Other | Source: Ambulatory Visit | Attending: Obstetrics | Admitting: Obstetrics

## 2020-12-11 VITALS — BP 110/69 | HR 94 | Ht 60.0 in | Wt 139.0 lb

## 2020-12-11 DIAGNOSIS — Z1151 Encounter for screening for human papillomavirus (HPV): Secondary | ICD-10-CM | POA: Diagnosis not present

## 2020-12-11 DIAGNOSIS — B373 Candidiasis of vulva and vagina: Secondary | ICD-10-CM

## 2020-12-11 DIAGNOSIS — Z124 Encounter for screening for malignant neoplasm of cervix: Secondary | ICD-10-CM | POA: Diagnosis not present

## 2020-12-11 DIAGNOSIS — Z01419 Encounter for gynecological examination (general) (routine) without abnormal findings: Secondary | ICD-10-CM | POA: Insufficient documentation

## 2020-12-11 DIAGNOSIS — L680 Hirsutism: Secondary | ICD-10-CM

## 2020-12-11 DIAGNOSIS — Z940 Kidney transplant status: Secondary | ICD-10-CM

## 2020-12-11 DIAGNOSIS — N898 Other specified noninflammatory disorders of vagina: Secondary | ICD-10-CM | POA: Insufficient documentation

## 2020-12-11 DIAGNOSIS — Z30011 Encounter for initial prescription of contraceptive pills: Secondary | ICD-10-CM

## 2020-12-11 DIAGNOSIS — Z131 Encounter for screening for diabetes mellitus: Secondary | ICD-10-CM

## 2020-12-11 DIAGNOSIS — B9689 Other specified bacterial agents as the cause of diseases classified elsewhere: Secondary | ICD-10-CM

## 2020-12-11 DIAGNOSIS — N179 Acute kidney failure, unspecified: Secondary | ICD-10-CM

## 2020-12-11 DIAGNOSIS — N760 Acute vaginitis: Secondary | ICD-10-CM

## 2020-12-11 DIAGNOSIS — Z3009 Encounter for other general counseling and advice on contraception: Secondary | ICD-10-CM

## 2020-12-11 DIAGNOSIS — B3731 Acute candidiasis of vulva and vagina: Secondary | ICD-10-CM

## 2020-12-11 MED ORDER — SLYND 4 MG PO TABS: 1.0000 | ORAL_TABLET | Freq: Every day | ORAL | 11 refills | Status: DC

## 2020-12-11 MED ORDER — TINIDAZOLE 500 MG PO TABS
1000.0000 mg | ORAL_TABLET | Freq: Every day | ORAL | 2 refills | Status: DC
Start: 2020-12-11 — End: 2021-05-21

## 2020-12-11 MED ORDER — FLUCONAZOLE 150 MG PO TABS: 150.0000 mg | ORAL_TABLET | Freq: Once | ORAL | 2 refills | Status: AC

## 2020-12-11 MED ORDER — METRONIDAZOLE 500 MG PO TABS
500.0000 mg | ORAL_TABLET | Freq: Two times a day (BID) | ORAL | 0 refills | Status: DC
Start: 2020-12-11 — End: 2021-01-05

## 2020-12-11 NOTE — Progress Notes (Signed)
Subjective:        Kerry Cook is a 35 y.o. female here for a routine exam.  Current complaints: Periods have been heavier and longer for the past 2 months.  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  She has been on dialysis for the past 2 months.  She also has vaginal discharge  Personal health questionnaire:  Is patient Ashkenazi Jewish, have a family history of breast and/or ovarian cancer: no Is there a family history of uterine cancer diagnosed at age < 37, gastrointestinal cancer, urinary tract cancer, family member who is a Field seismologist syndrome-associated carrier: no Is the patient overweight and hypertensive, family history of diabetes, personal history of gestational diabetes, preeclampsia or PCOS: no Is patient over 11, have PCOS,  family history of premature CHD under age 33, diabetes, smoke, have hypertension or peripheral artery disease:  no At any time, has a partner hit, kicked or otherwise hurt or frightened you?: no Over the past 2 weeks, have you felt down, depressed or hopeless?: no Over the past 2 weeks, have you felt little interest or pleasure in doing things?:no   Gynecologic History No LMP recorded. Contraception: none Last Pap: 2015. Results were: normal Last mammogram: n/a. Results were: n/a  Obstetric History OB History  Gravida Para Term Preterm AB Living  1       1 0  SAB IAB Ectopic Multiple Live Births  1            # Outcome Date GA Lbr Len/2nd Weight Sex Delivery Anes PTL Lv  1 SAB 07/05/14 [redacted]w[redacted]d          Past Medical History:  Diagnosis Date   Chronic kidney disease    Dialysis patient ([facility]    History of kidney transplant 2012   kidney failure due to hypertension   Hypertension    Multiple gastric ulcers    Pneumonia 01/23/2015    Past Surgical History:  Procedure Laterality Date   ESOPHAGOGASTRODUODENOSCOPY N/A 04/01/2016    Procedure: ESOPHAGOGASTRODUODENOSCOPY (EGD);  Surgeon: CGatha Mayer MD;  [facility]  Service: Endoscopy;  Laterality: N/A;   ESOPHAGOGASTRODUODENOSCOPY (EGD) WITH PROPOFOL N/A 11/20/2016   Procedure: ESOPHAGOGASTRODUODENOSCOPY (EGD) WITH PROPOFOL;  Surgeon: DDoran Stabler MD;  [place]  Service: Endoscopy;  Laterality: N/A;   KIDNEY TRANSPLANT Bilateral 2012     Current Outpatient Medications:    amLODipine (NORVASC) 10 MG tablet, Take 1 tablet (10 mg total) by mouth every evening. (Patient taking differently: Take 10 mg by mouth daily.), Disp: 30 tablet, Rfl: 0   b complex-vitamin c-folic acid (NEPHRO-VITE) 0.8 MG TABS tablet, Take 1 tablet by mouth daily., Disp: , Rfl:    Cholecalciferol 25 MCG (1000 UT) tablet, Take 1 tablet by mouth daily., Disp: , Rfl:    fluconazole (DIFLUCAN) 150 MG tablet, Take 1 tablet (150 mg total) by mouth once for 1 dose., Disp: 1 tablet, Rfl: 2   mirtazapine (REMERON) 15 MG tablet, Take 30 mg by mouth at bedtime., Disp: , Rfl:    mycophenolate (MYFORTIC) 180 MG EC tablet, Take 540 mg by mouth 2 (two) times daily. , Disp: , Rfl: 11   potassium chloride SA (KLOR-CON) 20 MEQ tablet, Take 20 mEq by mouth 2 (two) times daily., Disp: , Rfl:    predniSONE (DELTASONE) 5 MG tablet, Take 10 mg by  mouth daily., Disp: , Rfl: 5   tinidazole (TINDAMAX) 500 MG tablet, Take 2 tablets (1,000 mg total) by mouth daily with breakfast., Disp: 10 tablet, Rfl: 2   calcitRIOL (ROCALTROL) 0.25 MCG capsule, Take 0.25 mcg by mouth daily. (Patient not taking: Reported on 12/11/2020), Disp: , Rfl:    carvedilol (COREG) 25 MG tablet, Take 25 mg by mouth 2 (two) times daily. (Patient not taking: Reported on 12/11/2020), Disp: , Rfl:    cinacalcet (SENSIPAR) 30 MG tablet, Take 30 mg by mouth daily. (Patient not taking: Reported on 12/11/2020), Disp: , Rfl:    cyanocobalamin 1000 MCG tablet, Take 1,000 mcg by mouth daily., Disp: , Rfl:    fluticasone (FLONASE) 50  MCG/ACT nasal spray, Place 1 spray into both nostrils daily. (Patient not taking: Reported on 12/11/2020), Disp: 16 g, Rfl: 2   HYDROcodone-acetaminophen (NORCO/VICODIN) 5-325 MG tablet, Take 1 tablet by mouth every 4 (four) hours as needed for moderate pain.  (Patient not taking: Reported on 12/11/2020), Disp: , Rfl:    labetalol (NORMODYNE) 200 MG tablet, Take 1 tablet (200 mg total) by mouth 2 (two) times daily. (Patient not taking: Reported on 12/11/2020), Disp: 60 tablet, Rfl: 1   NIFEdipine (PROCARDIA XL/ADALAT-CC) 90 MG 24 hr tablet, Take 90 mg by mouth daily. (Patient not taking: Reported on 12/11/2020), Disp: , Rfl: 11   oxymetazoline (AFRIN NASAL SPRAY) 0.05 % nasal spray, Place 1 spray into both nostrils 2 (two) times daily. DO NOT USE MORE THAN 2-3 DAYS. (Patient not taking: Reported on 12/11/2020), Disp: 30 mL, Rfl: 0   pantoprazole (PROTONIX) 40 MG tablet, Take 40 mg by mouth daily. (Patient not taking: Reported on 12/11/2020), Disp: , Rfl: 11   sodium bicarbonate 650 MG tablet, Take 2 tablets by mouth in the morning and at bedtime. (Patient not taking: Reported on 12/11/2020), Disp: , Rfl:    tamsulosin (FLOMAX) 0.4 MG CAPS capsule, Take 0.4 mg by mouth 2 (two) times daily., Disp: , Rfl: 2   valGANciclovir (VALCYTE) 450 MG tablet, Take 450 mg by mouth daily. (Patient not taking: Reported on 12/11/2020), Disp: , Rfl: 2 Allergies  Allergen Reactions   Zosyn [Piperacillin Sod-Tazobactam So] Itching    Has patient had a PCN reaction causing immediate rash, facial/tongue/throat swelling, SOB or lightheadedness with hypotension: No Has patient had a PCN reaction causing severe rash involving mucus membranes or skin necrosis: No Has patient had a PCN reaction that required hospitalization: No Has patient had a PCN reaction occurring within the last 10 years: No If all of the above answers are "NO", then may proceed with Cephalosporin use.     Social History   Tobacco Use   Smoking status: Never    Smokeless tobacco: Never  Substance Use Topics   Alcohol use: No    Alcohol/week: 0.0 standard drinks    Family History  Problem Relation Age of Onset   Kidney disease Father    Lupus Maternal Grandmother    Cancer Maternal Grandfather    Colon cancer Neg Hx    Stomach cancer Neg Hx    Rectal cancer Neg Hx    Esophageal cancer Neg Hx    Liver cancer Neg Hx       Review of Systems  Constitutional: negative for fatigue and weight loss Respiratory: negative for cough and wheezing Cardiovascular: negative for chest pain, fatigue and palpitations Gastrointestinal: negative for abdominal pain and change in bowel habits Musculoskeletal:negative for myalgias Neurological: negative for gait problems and tremors  Behavioral/Psych: negative for abusive relationship, depression Endocrine: negative for temperature intolerance    Genitourinary:positive for abnormal menstrual periods and vaginal discharge.  Negative for genital lesions, hot flashes, sexual problems  Integument/breast: negative for breast lump, breast tenderness, nipple discharge and skin lesion(s)    Objective:       BP 110/69   Pulse 94   Ht 5' (1.524 m)   Wt 139 lb (63 kg)   BMI 27.15 kg/m  General:   alert  Skin:   no rash or abnormalities  Lungs:   clear to auscultation bilaterally  Heart:   regular rate and rhythm, S1, S2 normal, no murmur, click, rub or gallop  Breasts:   normal without suspicious masses, skin or nipple changes or axillary nodes  Abdomen:  normal findings: no organomegaly, soft, non-tender and no hernia  Pelvis:  External genitalia: normal general appearance Urinary system: urethral meatus normal and bladder without fullness, nontender Vaginal: normal without tenderness, induration or masses Cervix: normal appearance Adnexa: normal bimanual exam Uterus: anteverted and non-tender, normal size   Lab Review Urine pregnancy test Labs reviewed yes Radiologic studies reviewed yes  I have  spent a total of 20 minutes of face-to-face time, excluding clinical staff time, reviewing notes and preparing to see patient, ordering tests and/or medications, and counseling the patient.   Assessment:    1. Encounter for gynecological examination with Papanicolaou smear of cervix Rx: - Cytology - PAP( [place])  2. Vaginal discharge Rx: - Cervicovaginal ancillary only  3. BV (bacterial vaginosis) Rx: - tinidazole (TINDAMAX) 500 MG tablet; Take 2 tablets (1,000 mg total) by mouth daily with breakfast.  Dispense: 10 tablet; Refill: 2  4. Candida vaginitis Rx: - fluconazole (DIFLUCAN) 150 MG tablet; Take 1 tablet (150 mg total) by mouth once for 1 dose.  Dispense: 1 tablet; Refill: 2  5. Hirsutism Rx: - Testosterone, Free, Total, SHBG  6. Screening for diabetes mellitus Rx: - Hemoglobin A1c  7. Encounter for other general counseling and advice on contraception - progestin-only pills recommended and agreed to  8. Encounter for initial prescription of contraceptive pills - Slynd Rx ( samples dispensed - 6 month supply )  9. Acute renal failure, unspecified acute renal failure type ([place]) - patient has history of renal hypertension resulting in failure - BP now well controlled on mes  10. History of kidney transplant because of failure from renal hypertension - had transplant for 3 years before it failed 2 months ago     Plan:    Education reviewed: calcium supplements, depression evaluation, low fat, low cholesterol diet, safe sex/STD prevention, self breast exams, and weight bearing exercise. Contraception: condoms. Follow up in: 3 months.  Patient also Rx a progestin-only birth control [facility] ) for cycle regulation and contraception   Meds ordered this encounter  Medications   fluconazole (DIFLUCAN) 150 MG tablet    Sig: Take 1 tablet (150 mg total) by mouth once for 1 dose.    Dispense:  1 tablet    Refill:  2   tinidazole (TINDAMAX) 500 MG tablet    Sig:  Take 2 tablets (1,000 mg total) by mouth daily with breakfast.    Dispense:  10 tablet    Refill:  2   Orders Placed This Encounter  Procedures   Testosterone, Free, Total, SHBG   Hemoglobin A1c      Shelly Bombard, MD 12/11/2020 1:08 PM

## 2020-12-11 NOTE — Progress Notes (Signed)
GYN patient presents for GYN problem Visit today  Notes cycles are heavier now they still last 7 days.  Pt has been on Dialysis For 2 months now.  LMP:11/20/20 lasted 7 days had to change pad x every 1 hr.  Contraception: None

## 2020-12-12 ENCOUNTER — Other Ambulatory Visit: Payer: Self-pay | Admitting: Obstetrics

## 2020-12-12 LAB — CERVICOVAGINAL ANCILLARY ONLY
Bacterial Vaginitis (gardnerella): POSITIVE — AB
Candida Glabrata: NEGATIVE
Candida Vaginitis: NEGATIVE
Comment: NEGATIVE
Comment: NEGATIVE
Comment: NEGATIVE
Comment: NEGATIVE
Trichomonas: NEGATIVE

## 2020-12-13 ENCOUNTER — Other Ambulatory Visit: Payer: Self-pay

## 2020-12-13 ENCOUNTER — Emergency Department (HOSPITAL_COMMUNITY): Payer: Medicare Other

## 2020-12-13 ENCOUNTER — Encounter (HOSPITAL_COMMUNITY): Payer: Self-pay | Admitting: *Deleted

## 2020-12-13 ENCOUNTER — Observation Stay (HOSPITAL_COMMUNITY)
Admission: EM | Admit: 2020-12-13 | Discharge: 2020-12-14 | Disposition: A | Discharge status: Home / Self Care | Payer: Medicare Other | Attending: Student in an Organized Health Care Education/Training Program | Admitting: Student in an Organized Health Care Education/Training Program

## 2020-12-13 DIAGNOSIS — Z992 Dependence on renal dialysis: Secondary | ICD-10-CM | POA: Diagnosis not present

## 2020-12-13 DIAGNOSIS — D649 Anemia, unspecified: Secondary | ICD-10-CM | POA: Diagnosis not present

## 2020-12-13 DIAGNOSIS — N186 End stage renal disease: Secondary | ICD-10-CM | POA: Diagnosis not present

## 2020-12-13 DIAGNOSIS — I120 Hypertensive chronic kidney disease with stage 5 chronic kidney disease or end stage renal disease: Secondary | ICD-10-CM | POA: Diagnosis not present

## 2020-12-13 DIAGNOSIS — Z20822 Contact with and (suspected) exposure to covid-19: Secondary | ICD-10-CM | POA: Diagnosis not present

## 2020-12-13 DIAGNOSIS — Z79899 Other long term (current) drug therapy: Secondary | ICD-10-CM | POA: Insufficient documentation

## 2020-12-13 DIAGNOSIS — R079 Chest pain, unspecified: Secondary | ICD-10-CM | POA: Diagnosis present

## 2020-12-13 DIAGNOSIS — Z940 Kidney transplant status: Secondary | ICD-10-CM | POA: Diagnosis not present

## 2020-12-13 DIAGNOSIS — I10 Essential (primary) hypertension: Secondary | ICD-10-CM | POA: Diagnosis present

## 2020-12-13 LAB — BASIC METABOLIC PANEL
Anion gap: 8 (ref 5–15)
BUN: 10 mg/dL (ref 6–20)
CO2: 30 mmol/L (ref 22–32)
Calcium: 9.7 mg/dL (ref 8.9–10.3)
Chloride: 96 mmol/L — ABNORMAL LOW (ref 98–111)
Creatinine, Ser: 4.74 mg/dL — ABNORMAL HIGH (ref 0.44–1.00)
GFR, Estimated: 12 mL/min — ABNORMAL LOW (ref >60)
Glucose, Bld: 105 mg/dL — ABNORMAL HIGH (ref 70–99)
Potassium: 3.3 mmol/L — ABNORMAL LOW (ref 3.5–5.1)
Sodium: 134 mmol/L — ABNORMAL LOW (ref 135–145)

## 2020-12-13 LAB — I-STAT BETA HCG BLOOD, ED (MC, WL, AP ONLY): I-stat hCG, quantitative: <5 m[IU]/mL (ref <5)

## 2020-12-13 LAB — CBC
HCT: 22.5 % — ABNORMAL LOW (ref 36.0–46.0)
Hemoglobin: 6.6 g/dL — CL (ref 12.0–15.0)
MCH: 25.4 pg — ABNORMAL LOW (ref 26.0–34.0)
MCHC: 29.3 g/dL — ABNORMAL LOW (ref 30.0–36.0)
MCV: 86.5 fL (ref 80.0–100.0)
Platelets: 299 10*3/uL (ref 150–400)
RBC: 2.6 MIL/uL — ABNORMAL LOW (ref 3.87–5.11)
RDW: 18.5 % — ABNORMAL HIGH (ref 11.5–15.5)
WBC: 8.8 10*3/uL (ref 4.0–10.5)
nRBC: 0 % (ref 0.0–0.2)

## 2020-12-13 LAB — RESP PANEL BY RT-PCR (FLU A&B, COVID) ARPGX2
Influenza A by PCR: NEGATIVE
Influenza B by PCR: NEGATIVE
SARS Coronavirus 2 by RT PCR: NEGATIVE

## 2020-12-13 LAB — HIV ANTIBODY (ROUTINE TESTING W REFLEX): HIV Screen 4th Generation wRfx: NONREACTIVE

## 2020-12-13 LAB — FERRITIN: Ferritin: 665 ng/mL — ABNORMAL HIGH (ref 11–307)

## 2020-12-13 LAB — TROPONIN I (HIGH SENSITIVITY)
Troponin I (High Sensitivity): 15 ng/L (ref <18)
Troponin I (High Sensitivity): 12 ng/L (ref <18)

## 2020-12-13 LAB — POC OCCULT BLOOD, ED: Fecal Occult Bld: NEGATIVE

## 2020-12-13 LAB — PREPARE RBC (CROSSMATCH)

## 2020-12-13 LAB — IRON AND TIBC
Iron: 18 ug/dL — ABNORMAL LOW (ref 28–170)
Saturation Ratios: 11 % (ref 10.4–31.8)
TIBC: 160 ug/dL — ABNORMAL LOW (ref 250–450)
UIBC: 142 ug/dL

## 2020-12-13 MED ORDER — MYCOPHENOLATE SODIUM 180 MG PO TBEC
360.0000 mg | DELAYED_RELEASE_TABLET | Freq: Two times a day (BID) | ORAL | Status: DC
  Administered 2020-12-13 – 2020-12-14 (×2): 360 mg via ORAL
  Filled 2020-12-13 (×3): qty 2

## 2020-12-13 MED ORDER — SODIUM CHLORIDE 0.9 % IV SOLN
10.0000 mL/h | Freq: Once | INTRAVENOUS | Status: AC
  Administered 2020-12-14: 10 mL/h via INTRAVENOUS

## 2020-12-13 MED ORDER — PREDNISONE 5 MG PO TABS
5.0000 mg | ORAL_TABLET | Freq: Every day | ORAL | Status: DC
  Administered 2020-12-14: 5 mg via ORAL
  Filled 2020-12-13: qty 1

## 2020-12-13 MED ORDER — POTASSIUM CHLORIDE CRYS ER 20 MEQ PO TBCR
20.0000 meq | EXTENDED_RELEASE_TABLET | Freq: Once | ORAL | Status: AC
  Administered 2020-12-13: 20 meq via ORAL
  Filled 2020-12-13: qty 1

## 2020-12-13 MED ORDER — NIFEDIPINE ER OSMOTIC RELEASE 90 MG PO TB24
90.0000 mg | ORAL_TABLET | Freq: Every day | ORAL | Status: DC
  Administered 2020-12-14: 90 mg via ORAL
  Filled 2020-12-13: qty 1

## 2020-12-13 MED ORDER — CARVEDILOL 12.5 MG PO TABS
25.0000 mg | ORAL_TABLET | Freq: Two times a day (BID) | ORAL | Status: DC
  Administered 2020-12-13 – 2020-12-14 (×2): 25 mg via ORAL
  Filled 2020-12-13 (×2): qty 2

## 2020-12-13 NOTE — ED Provider Notes (Addendum)
[facility] EMERGENCY DEPARTMENT Provider Note   CSN: [id number] Arrival date & time: 12/13/20  1028     History Chief Complaint  Patient presents with   Chest Pain   Shortness of Breath    Kerry Cook is a 35 y.o. female.  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  Was admitted to [facility] 5/7 after she went into renal failure and was transferred from [facility]  Scheduled for dialysis in [facility] Tuesday Thursday and Saturday   7.2 on 11/01/20 Transfused 11/28/20   Patient states that she woke up with chest pain which is completely resolved at this time.  She states that she has felt some shortness of breath and some fatigue and weakness.  She states that this is been an ongoing worsening problem.  She states that it seemed worse today although she did not had any chest pain prior to today.  She went to her dialysis appointment and had the entire dialysis session with no difficulties.  Denies any fevers or chills no nausea or diaphoresis or dizziness.  She does endorse some lightheadedness however.   No recent surgeries, hospitalization, long travel, hemoptysis, estrogen containing OCP, cancer history.  No unilateral leg swelling.  No history of PE or VTE.,    Past Medical History:  Diagnosis Date   Chronic kidney disease    Dialysis patient [facility])    History of kidney transplant 2012   kidney failure due to hypertension   Hypertension    Multiple gastric ulcers    Pneumonia 01/23/2015    Patient Active Problem List   Diagnosis Date Noted   Cellulitis 04/22/2017   Cellulitis of left upper extremity 04/22/2017   Generalized abdominal pain    Medicare annual wellness visit, initial 10/31/2015   Cephalalgia    Headache 06/07/2015   Hypertension    History of kidney  transplant    Anemia in chronic kidney disease 10/01/2007   End stage renal disease on dialysis ([place]) 10/01/2007   PAP SMEAR, LGSIL, ABNORMAL 10/01/2007   PROTEINURIA 08/21/2006    Past Surgical History:  Procedure Laterality Date   ESOPHAGOGASTRODUODENOSCOPY N/A 04/01/2016   Procedure: ESOPHAGOGASTRODUODENOSCOPY (EGD);  Surgeon: Gatha Mayer, MD;  Location: [facility] ENDOSCOPY;  Service: Endoscopy;  Laterality: N/A;   ESOPHAGOGASTRODUODENOSCOPY (EGD) WITH PROPOFOL N/A 11/20/2016   Procedure: ESOPHAGOGASTRODUODENOSCOPY (EGD) WITH PROPOFOL;  Surgeon: Doran Stabler, MD;  Location: [place];  Service: Endoscopy;  Laterality: N/A;   KIDNEY TRANSPLANT Bilateral 2012     OB History     Gravida  1   Para      Term      Preterm      AB  1   Living  0      SAB  1   IAB      Ectopic      Multiple      Live Births              Family History  Problem Relation Age of Onset   Kidney disease Father    Lupus Maternal Grandmother    Cancer Maternal Grandfather    Colon cancer Neg Hx    Stomach cancer Neg Hx    Rectal cancer Neg Hx    Esophageal cancer Neg Hx    Liver cancer Neg Hx  Social History   Tobacco Use   Smoking status: Never   Smokeless tobacco: Never  Vaping Use   Vaping Use: Never used  Substance Use Topics   Alcohol use: No    Alcohol/week: 0.0 standard drinks   Drug use: No    Home Medications Prior to Admission medications   Medication Sig Start Date End Date Taking? Authorizing Provider  amLODipine (NORVASC) 10 MG tablet Take 1 tablet (10 mg total) by mouth every evening. Patient taking differently: Take 10 mg by mouth daily. 09/13/15   [name], MD  b complex-vitamin c-folic acid (NEPHRO-VITE) 0.8 MG TABS tablet Take 1 tablet by mouth daily. 10/24/14   [provider]  calcitRIOL (ROCALTROL) 0.25 MCG capsule Take 0.25 mcg by mouth daily. Patient not taking: Reported on 12/11/2020 08/03/20   [provider]   carvedilol (COREG) 25 MG tablet Take 25 mg by mouth 2 (two) times daily. Patient not taking: Reported on 12/11/2020 08/28/20   [provider]  Cholecalciferol 25 MCG (1000 UT) tablet Take 1 tablet by mouth daily. 07/28/18   [provider]  cinacalcet (SENSIPAR) 30 MG tablet Take 30 mg by mouth daily. Patient not taking: Reported on 12/11/2020 01/18/18   [provider]  cyanocobalamin 1000 MCG tablet Take 1,000 mcg by mouth daily.    [provider]  Drospirenone (SLYND) 4 MG TABS Take 1 tablet by mouth daily with breakfast. 12/11/20   Shelly Bombard, MD  fluticasone [facility]) 50 MCG/ACT nasal spray Place 1 spray into both nostrils daily. Patient not taking: Reported on 12/11/2020 07/12/18   Albesa Seen, PA-C  HYDROcodone-acetaminophen (NORCO/VICODIN) 5-325 MG tablet Take 1 tablet by mouth every 4 (four) hours as needed for moderate pain.  Patient not taking: Reported on 12/11/2020 01/17/18   [provider]  labetalol (NORMODYNE) 200 MG tablet Take 1 tablet (200 mg total) by mouth 2 (two) times daily. Patient not taking: Reported on 12/11/2020 04/02/16   Elgergawy, Silver Huguenin, MD  metroNIDAZOLE (FLAGYL) 500 MG tablet Take 1 tablet (500 mg total) by mouth 2 (two) times daily. 12/11/20   Shelly Bombard, MD  mirtazapine (REMERON) 15 MG tablet Take 30 mg by mouth at bedtime. 09/21/19   [provider]  mycophenolate (MYFORTIC) 180 MG EC tablet Take 540 mg by mouth 2 (two) times daily.  01/15/18   [provider]  NIFEdipine (PROCARDIA XL/ADALAT-CC) 90 MG 24 hr tablet Take 90 mg by mouth daily. Patient not taking: Reported on 12/11/2020 01/14/18   [provider]  oxymetazoline (AFRIN NASAL SPRAY) 0.05 % nasal spray Place 1 spray into both nostrils 2 (two) times daily. DO NOT USE MORE THAN 2-3 DAYS. Patient not taking: Reported on 12/11/2020 07/12/18   Langston Masker B, PA-C  pantoprazole (PROTONIX) 40 MG tablet Take 40 mg by mouth  daily. Patient not taking: Reported on 12/11/2020 01/15/18   [provider]  potassium chloride SA (KLOR-CON) 20 MEQ tablet Take 20 mEq by mouth 2 (two) times daily. 06/30/19   [provider]  predniSONE (DELTASONE) 5 MG tablet Take 10 mg by mouth daily. 01/15/18   [provider]  sodium bicarbonate 650 MG tablet Take 2 tablets by mouth in the morning and at bedtime. Patient not taking: Reported on 12/11/2020 07/05/19   [provider]  tamsulosin (FLOMAX) 0.4 MG CAPS capsule Take 0.4 mg by mouth 2 (two) times daily. 12/24/17   [provider]  tinidazole (TINDAMAX) 500 MG tablet Take  2 tablets (1,000 mg total) by mouth daily with breakfast. 12/11/20   Shelly Bombard, MD  valGANciclovir (VALCYTE) 450 MG tablet Take 450 mg by mouth daily. Patient not taking: Reported on 12/11/2020 01/15/18   [provider]    Allergies    Zosyn [piperacillin sod-tazobactam so]  Review of Systems   Review of Systems  Constitutional:  Positive for fatigue. Negative for chills and fever.  HENT:  Negative for congestion.   Eyes:  Negative for pain.  Respiratory:  Positive for shortness of breath. Negative for cough.   Cardiovascular:  Positive for chest pain. Negative for leg swelling.  Gastrointestinal:  Negative for abdominal pain and vomiting.  Genitourinary:  Negative for dysuria.  Musculoskeletal:  Negative for myalgias.  Skin:  Negative for rash.  Neurological:  Positive for light-headedness. Negative for dizziness and headaches.   Physical Exam Updated Vital Signs BP 129/82   Pulse 93   Temp 97.9 F (36.6 C) (Oral)   Resp 17   LMP 11/20/2020   SpO2 98%   Physical Exam Vitals and nursing note reviewed.  Constitutional:      General: She is not in acute distress. HENT:     Head: Normocephalic and atraumatic.     Nose: Nose normal.  Eyes:     General: No scleral icterus. Cardiovascular:     Rate and Rhythm: Normal rate and regular rhythm.      Pulses: Normal pulses.     Heart sounds: Normal heart sounds.  Pulmonary:     Effort: Pulmonary effort is normal. No respiratory distress.     Breath sounds: No wheezing.  Chest:     Comments: Rectal exam completed with RN at bedside as chaperone.  Unremarkable brown stool.  No melena hematochezia Abdominal:     Palpations: Abdomen is soft.     Tenderness: There is no abdominal tenderness.  Musculoskeletal:     Cervical back: Normal range of motion.     Right lower leg: No edema.     Left lower leg: No edema.  Skin:    General: Skin is warm and dry.     Capillary Refill: Capillary refill takes less than 2 seconds.  Neurological:     Mental Status: She is alert. Mental status is at baseline.  Psychiatric:        Mood and Affect: Mood normal.        Behavior: Behavior normal.    ED Results / Procedures / Treatments   Labs (all labs ordered are listed, but only abnormal results are displayed) Labs Reviewed  BASIC METABOLIC PANEL - Abnormal; Notable for the following components:      Result Value   Sodium 134 (*)    Potassium 3.3 (*)    Chloride 96 (*)    Glucose, Bld 105 (*)    Creatinine, Ser 4.74 (*)    GFR, Estimated 12 (*)    All other components within normal limits  CBC - Abnormal; Notable for the following components:   RBC 2.60 (*)    Hemoglobin 6.6 (*)    HCT 22.5 (*)    MCH 25.4 (*)    MCHC 29.3 (*)    RDW 18.5 (*)    All other components within normal limits  RESP PANEL BY RT-PCR (FLU A&B, COVID) ARPGX2  I-STAT BETA HCG BLOOD, ED ([facility], [facility], AP ONLY)  POC OCCULT BLOOD, ED  TYPE AND SCREEN  PREPARE RBC (CROSSMATCH)  TROPONIN I (HIGH SENSITIVITY)  TROPONIN I (  HIGH SENSITIVITY)    EKG EKG Interpretation  Date/Time:  Wednesday December 13 2020 10:42:12 EDT Ventricular Rate:  87 PR Interval:  140 QRS Duration: 84 QT Interval:  370 QTC Calculation: 445 R Axis:   16 Text Interpretation: Normal sinus rhythm T wave abnormality, consider lateral ischemia  Abnormal ECG Confirmed by Fredia Sorrow [id number]) on 12/13/2020 2:55:23 PM  Radiology DG Chest 2 View  Result Date: 12/13/2020 CLINICAL DATA:  On dialysis.  Low hemoglobin. EXAM: CHEST - 2 VIEW COMPARISON:  10/19/2019 FINDINGS: Midline trachea. Mild cardiomegaly. Mediastinal contours otherwise within normal limits. No pleural effusion or pneumothorax. Clear lungs. IMPRESSION: Cardiomegaly without congestive failure. Electronically Signed   By: Abigail Miyamoto M.D.   On: 12/13/2020 11:36    Procedures .Critical Care  Date/Time: 12/13/2020 4:40 PM Performed by: Tedd Sias, PA Authorized by: Tedd Sias, PA   Critical care provider statement:    Critical care time (minutes):  35   Critical care time was exclusive of:  Separately billable procedures and treating other patients and teaching time   Critical care was necessary to treat or prevent imminent or life-threatening deterioration of the following conditions: Severe symptomatic anemia requiring transfusion.   Critical care was time spent personally by me on the following activities:  Discussions with consultants, evaluation of patient's response to treatment, examination of patient, review of old charts, re-evaluation of patient's condition, pulse oximetry, ordering and review of radiographic studies, ordering and review of laboratory studies and ordering and performing treatments and interventions   I assumed direction of critical care for this patient from another provider in my specialty: no     Medications Ordered in ED Medications  0.9 %  sodium chloride infusion (has no administration in time range)    ED Course  I have reviewed the triage vital signs and the nursing notes.  Pertinent labs & imaging results that were available during my care of the patient were reviewed by me and considered in my medical decision making (see chart for details).  Patient ESRD most recent dialysis was this morning full course.  Chest x-ray  without any evidence of fluid overload with cardiomegaly is present.  Believe patient will be able to tolerate transfusion without Lasix or dialysis.  But will discuss with nephrology.  Clinical Course as of 12/13/20 1711  Wed Dec 13, 2020  1223 Dr. Justin Mend of nephrology given the patient is end-stage renal disease dialysis patient per Dr. Owens Loffler in agreement with my plan to provide with 1 unit PRBCs.  Patient still makes urine does not look fluid overloaded has chest x-ray without pulmonary edema [WF]  1516 Dr. Joelyn Oms --Dr. Joelyn Oms is available for consultation from inpatient services if needed.  I anticipate patient will be reasonable to discharge tomorrow after repeat CBC at the discretion of the inpatient services. [WF]  62 Hospitalist discussion had - will admit to medicine [WF]    Clinical Course User Index [WF] Tedd Sias, [place]    MDM Rules/Calculators/A&P                          ESRD patient here with symptomatic anemia.  1 unit of PRBC ordered and will be transfused there are delays here because of alloantibody issues.  Hemoglobin is 6.6.  Discussed with hospitalist before transfusion was done they do not request any additional labs.  BMP with elevated creatinine consistent with CKD/ESRD.  No significant electrolyte derangements.  Troponin within normal  limits x1.  hCG negative for pregnancy.  Hemoccult negative.  COVID swab obtained and pending.  Final Clinical Impression(s) / ED Diagnoses Final diagnoses:  Symptomatic anemia  Chest pain, unspecified type    Rx / DC Orders ED Discharge Orders     None        Tedd Sias, [place] 12/13/20 1641    Tedd Sias, [place] 12/13/20 1711    Fredia Sorrow, MD 12/21/20 1347

## 2020-12-13 NOTE — ED Notes (Signed)
Blood consent signed and at bedside  

## 2020-12-13 NOTE — H&P (Signed)
Date: 12/13/2020               Patient Name:  Kerry Cook MRN: [id number]  DOB: 07/09/1985 Age / Sex: 35 y.o., female   PCP: Patient, No Pcp Per (Inactive)         Medical Service: Internal Medicine Teaching Service         Attending Physician: Dr. Evette Doffing, Mallie Mussel, *    First Contact: Dr. Allyson Sabal Pager: [phone]  Second Contact: Dr. 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  [name] states she was at dialysis today and was notified regarding her low hemoglobin. She endorses chest pain during dialysis. The pain self-resolved without intervention. Otherwise, denies SOB, dizziness, generalized weakness. She endorses cold intolerance and fatigue lately.    She denies fever, chills, hematuria, melena, abdominal pain, nausea, vomiting. She does notice some bright red streaks on her toilet paper when wiping, but no other hematochezia.   [name] endorses heavy menstrual cycles regularly. Her last cycle lasted from May 30 - June 5 with heavy flow each day; she notes using 4-5 tampons per day with at least 2 pads through the night. No additional vaginal bleeding since then.   She has had two kidney transplants (one in She is still taking myfortic '360mg'$  BID and prednisone '5mg'$  daily. [place] for this.  She does not believe she is on any EPO medications at this time.   Meds:   No current facility-administered medications on file prior to encounter.   Current Outpatient Medications on File Prior  to Encounter  Medication Sig Dispense Refill   b complex-vitamin c-folic acid (NEPHRO-VITE) 0.8 MG TABS tablet Take 1 tablet by mouth daily.     Cholecalciferol 25 MCG (1000 UT) tablet Take 1 tablet by mouth daily.     mirtazapine (REMERON) 15 MG tablet Take 30 mg by mouth at bedtime.     mycophenolate (MYFORTIC) 180 MG EC tablet Take 540 mg by mouth 2 (two) times daily.   11   predniSONE (DELTASONE) 5 MG tablet Take 5 mg by mouth daily.  5   TUMS 500 MG chewable tablet Chew 1 tablet by mouth 3 (three) times daily.     amLODipine (NORVASC) 10 MG tablet Take 1 tablet (10 mg total) by mouth every evening. (Patient not taking: No sig reported) 30 tablet 0   Drospirenone (SLYND) 4 MG TABS Take 1 tablet by mouth daily with breakfast. 28 tablet 11   fluticasone (FLONASE) 50 MCG/ACT nasal spray Place 1 spray into both nostrils daily. (Patient not taking: No sig reported) 16 g 2   labetalol (NORMODYNE) 200 MG tablet Take 1 tablet (200 mg total) by mouth 2 (two) times daily. (Patient not taking: No sig reported) 60 tablet 1   metroNIDAZOLE (FLAGYL) 500 MG tablet Take 1 tablet (500 mg total) by mouth 2 (two) times daily. (Patient not taking: No sig reported) 14 tablet 0   oxymetazoline (AFRIN NASAL SPRAY) 0.05 % nasal spray Place 1 spray into both nostrils 2 (two) times  daily. DO NOT USE MORE THAN 2-3 DAYS. (Patient not taking: No sig reported) 30 mL 0   tinidazole (TINDAMAX) 500 MG tablet Take 2 tablets (1,000 mg total) by mouth daily with breakfast. 10 tablet 2    Allergies: Allergies as of 12/13/2020 - Review Complete 12/13/2020  Allergen Reaction Noted   Zosyn [piperacillin sod-tazobactam so] Itching 04/22/2017   Past Medical History:  Diagnosis Date   Chronic kidney disease    Dialysis patient [facility])    History of kidney transplant 2012   kidney failure due to hypertension   Hypertension    Multiple gastric ulcers    Pneumonia 01/23/2015   Family History:  Family History  Problem Relation  Age of Onset   Kidney disease Father    Lupus Maternal Grandmother    Cancer Maternal Grandfather    Colon cancer Neg Hx    Stomach cancer Neg Hx    Rectal cancer Neg Hx    Esophageal cancer Neg Hx    Liver cancer Neg Hx    Social History:  Lives with aunt and uncle.  No alcohol, tobacco or drug use.  Works as a Pharmacist, hospital  Review of Systems: A complete ROS was negative except as per HPI.   Physical Exam: Blood pressure 128/84, pulse 97, temperature 97.9 F (36.6 C), temperature source Oral, resp. rate 15, last menstrual period 11/20/2020, SpO2 98 %. Physical Exam Constitutional:      Appearance: She is well-developed. She is not ill-appearing.  HENT:     Head: Normocephalic and atraumatic.  Eyes:     Extraocular Movements: Extraocular movements intact.     Pupils: Pupils are equal, round, and reactive to light.     Comments: Conjunctival pallor noted.  Cardiovascular:     Rate and Rhythm: Normal rate and regular rhythm.     Heart sounds: Murmur heard.  Systolic murmur is present.  Pulmonary:     Effort: Pulmonary effort is normal. No respiratory distress.     Breath sounds: Normal breath sounds. No wheezing, rhonchi or rales.  Abdominal:     General: Bowel sounds are normal.     Palpations: Abdomen is soft.     Tenderness: There is no abdominal tenderness. There is no guarding or rebound.  Musculoskeletal:        General: Normal range of motion.     Cervical back: Normal range of motion and neck supple.     Right lower leg: No edema.     Left lower leg: No edema.  Skin:    General: Skin is warm and dry.  Neurological:     General: No focal deficit present.     Mental Status: She is alert and oriented to person, place, and time.  Psychiatric:        Mood and Affect: Mood normal.        Behavior: Behavior normal.   EKG: none  DG Chest 2 View  Result Date: 12/13/2020 CLINICAL DATA:  On dialysis.  Low hemoglobin. EXAM: CHEST - 2 VIEW COMPARISON:  10/19/2019  FINDINGS: Midline trachea. Mild cardiomegaly. Mediastinal contours otherwise within normal limits. No pleural effusion or pneumothorax. Clear lungs. IMPRESSION: Cardiomegaly without congestive failure. Electronically Signed   By: Abigail Miyamoto M.D.   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   Symptomatic Anemia Hemoglobin of 6.6 on admission today; recent hx of low hemoglobin at dialysis requiring transfusion with last transfusion on November 22, 2020. I suspect this is either secondary to IDA 2/2 to heavy menstrual cycle and/or anemia of chronic renal disease. She would like benefit from restarting Aranesp, but will defer this decision to her nephrologist.  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 She follows with nephrology and transplant in [place] to take myfortic '360mg'$  daily, prednisone '5mg'$  daily.  -Will consult Nephrology if patient will require inpatient dialysis.   HTN On coreg '25mg'$  BID and nifedipine '90mg'$  qhs at home. -resume home medications  GERD -continue home protonix '20mg'$  daily  Systolic Murmur Likely a flow murmur in the setting of anemia. Asymptomatic at this time. No further intervention indicated during admission.   Dispo: Admit patient to Observation with expected length of stay less than 2 midnights.  Signed: Virl Axe, MD 12/13/2020,  6:14 PM  Pager: [phone] After 5pm on weekdays and 1pm on weekends: On Call pager: [phone]

## 2020-12-13 NOTE — ED Notes (Signed)
This RN received a call from the blood bank. Pt is positive for antibodies. Per blood bank they are currently working on getting her a couple units that she would be compatible with but it could take some time. This RN asked that they please just keep her informed and let this RN know when she has units available. Will continue to monitor.

## 2020-12-13 NOTE — ED Notes (Signed)
Pt stated no chest pain present.

## 2020-12-13 NOTE — ED Triage Notes (Signed)
Dialysis pt, states she woke up with am with sharp chest pain. Was able to complete her dialysis treatment. Reports mild sob, no resp distress is noted.

## 2020-12-14 DIAGNOSIS — D649 Anemia, unspecified: Secondary | ICD-10-CM | POA: Diagnosis not present

## 2020-12-14 DIAGNOSIS — Z992 Dependence on renal dialysis: Secondary | ICD-10-CM | POA: Diagnosis not present

## 2020-12-14 DIAGNOSIS — I10 Essential (primary) hypertension: Secondary | ICD-10-CM

## 2020-12-14 DIAGNOSIS — N186 End stage renal disease: Secondary | ICD-10-CM | POA: Diagnosis not present

## 2020-12-14 LAB — CBC
HCT: 22.3 % — ABNORMAL LOW (ref 36.0–46.0)
Hemoglobin: 6.8 g/dL — CL (ref 12.0–15.0)
MCH: 25.5 pg — ABNORMAL LOW (ref 26.0–34.0)
MCHC: 30.5 g/dL (ref 30.0–36.0)
MCV: 83.5 fL (ref 80.0–100.0)
Platelets: 285 10*3/uL (ref 150–400)
RBC: 2.67 MIL/uL — ABNORMAL LOW (ref 3.87–5.11)
RDW: 19 % — ABNORMAL HIGH (ref 11.5–15.5)
WBC: 9 10*3/uL (ref 4.0–10.5)
nRBC: 0 % (ref 0.0–0.2)

## 2020-12-14 LAB — CYTOLOGY - PAP
Comment: NEGATIVE
Comment: NEGATIVE
HPV 16: NEGATIVE
HPV 18 / 45: NEGATIVE
High risk HPV: POSITIVE — AB

## 2020-12-14 LAB — BASIC METABOLIC PANEL
Anion gap: 8 (ref 5–15)
BUN: 23 mg/dL — ABNORMAL HIGH (ref 6–20)
CO2: 27 mmol/L (ref 22–32)
Calcium: 8 mg/dL — ABNORMAL LOW (ref 8.9–10.3)
Chloride: 97 mmol/L — ABNORMAL LOW (ref 98–111)
Creatinine, Ser: 7.92 mg/dL — ABNORMAL HIGH (ref 0.44–1.00)
GFR, Estimated: 6 mL/min — ABNORMAL LOW (ref >60)
Glucose, Bld: 83 mg/dL (ref 70–99)
Potassium: 3.9 mmol/L (ref 3.5–5.1)
Sodium: 132 mmol/L — ABNORMAL LOW (ref 135–145)

## 2020-12-14 LAB — HEMOGLOBIN A1C
Est. average glucose Bld gHb Est-mCnc: 91 mg/dL
Hgb A1c MFr Bld: 4.8 % (ref 4.8–5.6)

## 2020-12-14 LAB — RETICULOCYTES
Immature Retic Fract: 27.3 % — ABNORMAL HIGH (ref 2.3–15.9)
RBC.: 3.17 MIL/uL — ABNORMAL LOW (ref 3.87–5.11)
Retic Count, Absolute: 33 10*3/uL (ref 19.0–186.0)
Retic Ct Pct: 1.2 % (ref 0.4–3.1)

## 2020-12-14 LAB — HEMOGLOBIN AND HEMATOCRIT, BLOOD
HCT: 24 % — ABNORMAL LOW (ref 36.0–46.0)
HCT: 26.8 % — ABNORMAL LOW (ref 36.0–46.0)
Hemoglobin: 7.3 g/dL — ABNORMAL LOW (ref 12.0–15.0)
Hemoglobin: 8.1 g/dL — ABNORMAL LOW (ref 12.0–15.0)

## 2020-12-14 LAB — PREPARE RBC (CROSSMATCH)

## 2020-12-14 LAB — TESTOSTERONE, FREE, TOTAL, SHBG
Sex Hormone Binding: 50.1 nmol/L (ref 24.6–122.0)
Testosterone, Free: 1.5 pg/mL (ref 0.0–4.2)
Testosterone: 16 ng/dL (ref 8–60)

## 2020-12-14 MED ORDER — SODIUM CHLORIDE 0.9 % IV SOLN
200.0000 mg | Freq: Every day | INTRAVENOUS | Status: DC
  Administered 2020-12-14: 200 mg via INTRAVENOUS
  Filled 2020-12-14: qty 10

## 2020-12-14 MED ORDER — SODIUM CHLORIDE 0.9% IV SOLUTION: Freq: Once | INTRAVENOUS | Status: AC

## 2020-12-14 MED ORDER — CARVEDILOL 25 MG PO TABS: 25.0000 mg | ORAL_TABLET | Freq: Two times a day (BID) | ORAL | 0 refills | Status: DC

## 2020-12-14 MED ORDER — SODIUM CHLORIDE 0.9 % IV SOLN: 200.0000 mg | Freq: Every day | INTRAVENOUS | Status: DC

## 2020-12-14 MED ORDER — NIFEDIPINE ER OSMOTIC RELEASE 90 MG PO TB24: 90.0000 mg | ORAL_TABLET | Freq: Every day | ORAL | 0 refills | Status: DC

## 2020-12-14 NOTE — Discharge Summary (Signed)
Name: Kerry Cook MRN: [id number] DOB: 12/31/1985 35 y.o. PCP: Patient, No Pcp Per (Inactive)  Date of Admission: 12/13/2020 10:33 AM Date of Discharge:  Attending Physician: Axel Filler, *  Discharge Diagnosis: 1. Symptomatic Anemia, likely multifactorial 2/2 IDA, blood loss from menorrhagia, and chronic anemia of renal disease  Discharge Medications: Allergies as of 12/14/2020       Reactions   Zosyn [piperacillin Sod-tazobactam So] Itching   Has patient had a PCN reaction causing immediate rash, facial/tongue/throat swelling, SOB or lightheadedness with hypotension: No Has patient had a PCN reaction causing severe rash involving mucus membranes or skin necrosis: No Has patient had a PCN reaction that required hospitalization: No Has patient had a PCN reaction occurring within the last 10 years: No If all of the above answers are "NO", then may proceed with Cephalosporin use.        Medication List     STOP taking these medications    amLODipine 10 MG tablet Commonly known as: NORVASC   labetalol 200 MG tablet Commonly known as: NORMODYNE   oxymetazoline 0.05 % nasal spray Commonly known as: Afrin Nasal Spray       TAKE these medications    b complex-vitamin c-folic acid 0.8 MG Tabs tablet Take 1 tablet by mouth daily.   carvedilol 25 MG tablet Commonly known as: COREG Take 1 tablet (25 mg total) by mouth 2 (two) times daily with a meal.   Cholecalciferol 25 MCG (1000 UT) tablet Take 1 tablet by mouth daily.   mirtazapine 15 MG tablet Commonly known as: REMERON Take 30 mg by mouth at bedtime.   mycophenolate 180 MG EC tablet Commonly known as: MYFORTIC Take 540 mg by mouth 2 (two) times daily.   NIFEdipine 90 MG 24 hr tablet Commonly known as: PROCARDIA XL/NIFEDICAL-XL Take 1 tablet (90 mg total) by mouth daily. Start taking on: December 15, 2020   predniSONE 5 MG tablet Commonly known as: DELTASONE Take 5 mg by mouth daily.    Slynd 4 MG Tabs Generic drug: Drospirenone Take 1 tablet by mouth daily with breakfast.   tinidazole 500 MG tablet Commonly known as: Tindamax Take 2 tablets (1,000 mg total) by mouth daily with breakfast.   Tums 500 MG chewable tablet Generic drug: calcium carbonate Chew 1 tablet by mouth 3 (three) times daily.       ASK your doctor about these medications    fluticasone 50 MCG/ACT nasal spray Commonly known as: FLONASE Place 1 spray into both nostrils daily.   metroNIDAZOLE 500 MG tablet Commonly known as: FLAGYL Take 1 tablet (500 mg total) by mouth 2 (two) times daily.        Disposition and follow-up:   KerryKerry Cook was discharged from [facility] in Stable condition.  At the hospital follow up visit please address:  1. Symptomatic Anemia, likely multifactorial 2/2 IDA, blood loss from menorrhagia, and chronic anemia of renal disease. Received one dose of IV Venofer while admitted and 2 units of pRBCs. Post-transfusion Hgb 8.1. Started on oral progestin therapy by Gynecologist to reduce menorrhagia. Will be receiving EPO products and routine iron infusions during HD sessions (MWF).   2.  Labs / imaging needed at time of follow-up: CBC  3.  Pending labs/ test needing follow-up: NONE  Follow-up Appointments:   Hospital Course by problem list: 1. Symptomatic Anemia, likely multifactorial 2/2 IDA, blood loss from menorrhagia, and chronic anemia of renal disease. Ms. Kerry Cook presented after  HD session yesterday where she was found to have low hemoglobin and complaints of chest pain.  She was admitted for management of symptomatic anemia.  On admission, her hemoglobin was noted to be 6.6.  During our encounter, she notes that her chest pain has already resolved but continued to feel somewhat tired and cold.  Her symptomatic anemia is likely multifactorial in the setting of iron deficiency, blood loss from menorrhagia (she notes significantly  heavy flow during her menstrual cycles), and chronic anemia of renal disease (see below).  Blood typing showed that she continues to have an anti Kidd antibody, and our blood bank was able to provide her with a donor product negative for the Kidd antigen.  Overall, she received 2 units of PRBCs.  Posttransfusion H&H showed hemoglobin of 8.1.  Patient was recently started on oral progestin therapy by her gynecologist to reduce her menorrhagia.  Spoke with nephrology, confirmed that patient is receiving EPO products as well as routine iron infusions during HD.  Recommended giving 1 transfusion of IV Venofer for iron supplementation but hold EPO products as she will receive it during her next HD session.  Spoke with patient today, her symptoms have resolved.  She would like to go home later today.  Will discharge patient today with plan for her to receive hemodialysis tomorrow and obtain further iron supplementation along with EPO products during HD.  Continue oral progestin therapy at discharge.  2.  End-stage renal disease, on HD MWF.  ESRD secondary to focal segmental glomerulosclerosis status post deceased donor kidney transplantation x2 (2012, 2019) with graft rejection x2 (2019, 10/2020).  She remains on Myfortic and prednisone for immunosuppression. she recently restarted hemodialysis through a left forearm fistula after acute graft rejection 6 weeks ago.  She follows with Dr. Moshe Cipro at [facility].  She will be going for hemodialysis tomorrow per her regular schedule.  Continue Myfortic and prednisone at discharge.   Discharge Exam:   BP (!) 158/110   Pulse 93   Temp 98.1 F (36.7 C) (Oral)   Resp 18   Ht 5' (1.524 m)   Wt 63.5 kg   LMP 11/20/2020   SpO2 100%   BMI 27.34 kg/m  Discharge exam:  General: Well-developed, well-nourished female lying in bed, NAD. CV: Normal rate and regular rhythm, positive systolic murmur. Pulm: Normal respiratory effort, no respiratory  distress noted. Abdomen: Soft, nondistended, nontender, normoactive bowel sounds. MSK: No peripheral edema noted. Neuro: AAOx4, no focal deficits noted. Skin: Warm and dry.  Pertinent Labs, Studies, and Procedures:  CBC Latest Ref Rng & Units 12/14/2020 12/14/2020 12/13/2020  WBC 4.0 - 10.5 K/uL - 9.0 -  Hemoglobin 12.0 - 15.0 g/dL 8.1(L) 6.8(LL) 7.3(L)  Hematocrit 36.0 - 46.0 % 26.8(L) 22.3(L) 24.0(L)  Platelets 150 - 400 K/uL - 285 -   CMP Latest Ref Rng & Units 12/14/2020 12/13/2020 10/28/2020  Glucose 70 - 99 mg/dL 83 105(H) 98  BUN 6 - 20 mg/dL 23(H) 10 67(H)  Creatinine 0.44 - 1.00 mg/dL 7.92(H) 4.74(H) 16.11(H)  Sodium 135 - 145 mmol/L 132(L) 134(L) 133(L)  Potassium 3.5 - 5.1 mmol/L 3.9 3.3(L) 4.3  Chloride 98 - 111 mmol/L 97(L) 96(L) 98  CO2 22 - 32 mmol/L 27 30 19(L)  Calcium 8.9 - 10.3 mg/dL 8.0(L) 9.7 8.2(L)  Total Protein 6.5 - 8.1 g/dL - - 7.7  Total Bilirubin 0.3 - 1.2 mg/dL - - 1.0  Alkaline Phos 38 - 126 U/L - - 111  AST 15 -  41 U/L - - 18  ALT 0 - 44 U/L - - 7   Iron/TIBC/Ferritin/ %Sat    Component Value Date/Time   IRON 18 (L) 12/13/2020 1746   TIBC 160 (L) 12/13/2020 1746   FERRITIN 665 (H) 12/13/2020 1746   IRONPCTSAT 11 12/13/2020 1746   Lab Results  Component Value Date   RETICCTPCT 1.2 12/14/2020   RBC 3.17 (L) 12/14/2020   MRET 33.0 12/14/2020   IRF 27.3 (H) 12/14/2020     Discharge Instructions: Discharge Instructions     Call MD for:  difficulty breathing, headache or visual disturbances   Complete by: As directed    Call MD for:  extreme fatigue   Complete by: As directed    Call MD for:  persistant dizziness or light-headedness   Complete by: As directed    Call MD for:  persistant nausea and vomiting   Complete by: As directed    Diet - low sodium heart healthy   Complete by: As directed    Discharge instructions   Complete by: As directed    Ms. Friederichs, it was a pleasure taking care of you during your stay here.  You came in with  low hemoglobin and were treated with blood transfusion and iron infusion.  Please take note of the following: 1.  Please make sure to go to your hemodialysis session tomorrow.  2.  Please make sure that you are receiving Aranesp and routine iron infusions during your hemodialysis sessions.  Please check this with your kidney doctor.  3.  Please make sure you continue to take your oral contraceptive that was prescribed by her gynecologist so that you can reduce your flow during your menstrual cycles.   Increase activity slowly   Complete by: As directed        Signed: Virl Axe, MD 12/14/2020, 2:31 PM   Pager: [phone]

## 2020-12-14 NOTE — Progress Notes (Signed)
   Subjective:  Denies dizziness, LH, chest pain. Felt fine after 1 unit of PRBC. Discussed we are giving additional unit of blood.   Access for dialysis left wrist. Off of dialysis for 4 years. Denies receiving blood during dialysis sessions. Dialysis center is in [place], her nephrologist is Dr. Moshe Cipro.  She was started on oral contraception (Slynd) recently due to heavy menstrual cycles.   Objective:  Vital signs in last 24 hours: Vitals:   12/14/20 1000 12/14/20 1006 12/14/20 1007 12/14/20 1100  BP: 120/73 110/78 110/78 123/82  Pulse: 87  85 82  Resp: 19  (!) 30 19  Temp:  98.1 F (36.7 C)    TempSrc:  Oral    SpO2: 97%  97% 90%  Weight:      Height:       Physical exam: General: Well-developed, well-nourished female lying in bed, NAD. CV: Normal rate and regular rhythm, positive systolic murmur. Pulm: Normal respiratory effort, no respiratory distress noted. Abdomen: Soft, nondistended, nontender, normoactive bowel sounds. MSK: No peripheral edema noted. Neuro: AAOx4, no focal deficits noted.  Assessment/Plan:  Principal Problem:   Symptomatic anemia Active Problems:   End stage renal disease on dialysis (HCC)   Hypertension   History of kidney transplant  Symptomatic anemia Likely multifactorial in the setting of iron deficiency, blood loss from menorrhagia, and chronic anemia of renal disease.  She was recently started on oral progestin therapy to reduce menorrhagia.  Discussed with nephrology, recommended patient be started on Venofer for iron supplementation while inpatient.  Nephrology also confirmed that patient is receiving EPO products during HD sessions.  Blood typing showed patient has anti-Kidd antibodies.  Thus far she has received 2 units of PRBCs with a donor product negative for the Kidd antigen with no signs of immediate or delayed transfusion reaction.  Patient reports that her symptoms are improved since admission.  We will see how patient does  with ambulation and regular activity.  Hopeful that if symptoms have resolved she can be discharged later today with follow-up at HD center.  If she is unable to go today, nephrology will see her tomorrow for possible inpatient dialysis. 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 Moshe Cipro at [facility].  If patient remains hospitalized tomorrow, nephrology will come see patient for potential inpatient dialysis. -Continue home Myfortic and prednisone  Hypertension -Continue home Coreg 25 mg twice daily and nifedipine 90 mg nightly  Prior to Admission Living Arrangement: Home Anticipated Discharge Location: Home Barriers to Discharge: Continued medical management Dispo: Anticipated discharge in approximately 1-2 day(s).   Virl Axe, MD 12/14/2020, 12:30 PM Pager: [phone] After 5pm on weekdays and 1pm on weekends: On Call pager [phone]

## 2020-12-14 NOTE — ED Notes (Signed)
Pt PRBC completed and pt tolerated this fine, brought her some ice chips, toileting offered,.  Call bell in place, introduced myself to pt.

## 2020-12-14 NOTE — ED Notes (Signed)
Call to 5W and RN will call me back

## 2020-12-14 NOTE — ED Notes (Signed)
Call to 5W to let them know that pt will d/c today from ED.

## 2020-12-14 NOTE — ED Notes (Signed)
Attending paged regarding pt's low hemoglobin.

## 2020-12-14 NOTE — ED Notes (Signed)
MD responded & was informed by this RN about critical hemoglobin of 6.8.

## 2020-12-14 NOTE — ED Notes (Signed)
Call back to 5W to give report, await call to give report.

## 2020-12-15 LAB — BPAM RBC
Blood Product Expiration Date: 202207162359
Blood Product Expiration Date: 202207262359
Blood Product Expiration Date: 202207302359
ISSUE DATE / TIME: 202206221938
ISSUE DATE / TIME: 202206230720
Unit Type and Rh: 5100
Unit Type and Rh: 5100
Unit Type and Rh: 5100

## 2020-12-15 LAB — TYPE AND SCREEN
ABO/RH(D): O POS
Antibody Screen: POSITIVE
DAT, IgG: NEGATIVE
Donor AG Type: NEGATIVE
Donor AG Type: NEGATIVE
Donor AG Type: NEGATIVE
Unit division: 0
Unit division: 0
Unit division: 0

## 2020-12-18 ENCOUNTER — Telehealth: Payer: Self-pay

## 2020-12-18 NOTE — Telephone Encounter (Signed)
Patient has been inform of test results. Will schedule colp

## 2020-12-18 NOTE — Telephone Encounter (Signed)
-----   Message from Shelly Bombard, MD sent at 12/14/2020  3:16 PM EDT ----- LGSIL pap smear.  Schedule colposcopy.

## 2021-01-05 ENCOUNTER — Inpatient Hospital Stay (HOSPITAL_COMMUNITY)
Admission: EM | Admit: 2021-01-05 | Discharge: 2021-01-07 | DRG: 811 | Disposition: A | Discharge status: Home / Self Care | Payer: Medicare Other | Attending: Internal Medicine | Admitting: Internal Medicine

## 2021-01-05 ENCOUNTER — Encounter (HOSPITAL_COMMUNITY): Payer: Self-pay | Admitting: Emergency Medicine

## 2021-01-05 ENCOUNTER — Other Ambulatory Visit: Payer: Self-pay | Admitting: Student

## 2021-01-05 ENCOUNTER — Other Ambulatory Visit: Payer: Self-pay

## 2021-01-05 DIAGNOSIS — D84821 Immunodeficiency due to drugs: Secondary | ICD-10-CM | POA: Diagnosis present

## 2021-01-05 DIAGNOSIS — D649 Anemia, unspecified: Secondary | ICD-10-CM

## 2021-01-05 DIAGNOSIS — N051 Unspecified nephritic syndrome with focal and segmental glomerular lesions: Secondary | ICD-10-CM | POA: Diagnosis present

## 2021-01-05 DIAGNOSIS — D631 Anemia in chronic kidney disease: Secondary | ICD-10-CM | POA: Diagnosis present

## 2021-01-05 DIAGNOSIS — T8612 Kidney transplant failure: Secondary | ICD-10-CM | POA: Diagnosis present

## 2021-01-05 DIAGNOSIS — Z9114 Patient's other noncompliance with medication regimen: Secondary | ICD-10-CM

## 2021-01-05 DIAGNOSIS — N920 Excessive and frequent menstruation with regular cycle: Secondary | ICD-10-CM | POA: Diagnosis present

## 2021-01-05 DIAGNOSIS — N186 End stage renal disease: Secondary | ICD-10-CM | POA: Diagnosis present

## 2021-01-05 DIAGNOSIS — Z8711 Personal history of peptic ulcer disease: Secondary | ICD-10-CM

## 2021-01-05 DIAGNOSIS — E876 Hypokalemia: Secondary | ICD-10-CM | POA: Diagnosis present

## 2021-01-05 DIAGNOSIS — R000 Tachycardia, unspecified: Secondary | ICD-10-CM | POA: Diagnosis present

## 2021-01-05 DIAGNOSIS — N2581 Secondary hyperparathyroidism of renal origin: Secondary | ICD-10-CM | POA: Diagnosis present

## 2021-01-05 DIAGNOSIS — R0602 Shortness of breath: Secondary | ICD-10-CM | POA: Diagnosis present

## 2021-01-05 DIAGNOSIS — Z91048 Other nonmedicinal substance allergy status: Secondary | ICD-10-CM

## 2021-01-05 DIAGNOSIS — Y838 Other surgical procedures as the cause of abnormal reaction of the patient, or of later complication, without mention of misadventure at the time of the procedure: Secondary | ICD-10-CM | POA: Diagnosis present

## 2021-01-05 DIAGNOSIS — K219 Gastro-esophageal reflux disease without esophagitis: Secondary | ICD-10-CM | POA: Diagnosis present

## 2021-01-05 DIAGNOSIS — Z7952 Long term (current) use of systemic steroids: Secondary | ICD-10-CM

## 2021-01-05 DIAGNOSIS — B259 Cytomegaloviral disease, unspecified: Secondary | ICD-10-CM | POA: Diagnosis present

## 2021-01-05 DIAGNOSIS — I10 Essential (primary) hypertension: Secondary | ICD-10-CM | POA: Diagnosis present

## 2021-01-05 DIAGNOSIS — R5383 Other fatigue: Secondary | ICD-10-CM | POA: Diagnosis present

## 2021-01-05 DIAGNOSIS — D62 Acute posthemorrhagic anemia: Principal | ICD-10-CM | POA: Diagnosis present

## 2021-01-05 DIAGNOSIS — Z79899 Other long term (current) drug therapy: Secondary | ICD-10-CM

## 2021-01-05 DIAGNOSIS — Z992 Dependence on renal dialysis: Secondary | ICD-10-CM

## 2021-01-05 DIAGNOSIS — Z20822 Contact with and (suspected) exposure to covid-19: Secondary | ICD-10-CM | POA: Diagnosis present

## 2021-01-05 DIAGNOSIS — Z841 Family history of disorders of kidney and ureter: Secondary | ICD-10-CM

## 2021-01-05 LAB — CBC WITH DIFFERENTIAL/PLATELET
Abs Immature Granulocytes: 0.06 10*3/uL (ref 0.00–0.07)
Basophils Absolute: 0 10*3/uL (ref 0.0–0.1)
Basophils Relative: 0 %
Eosinophils Absolute: 1.2 10*3/uL — ABNORMAL HIGH (ref 0.0–0.5)
Eosinophils Relative: 11 %
HCT: 16.8 % — ABNORMAL LOW (ref 36.0–46.0)
Hemoglobin: 4.9 g/dL — CL (ref 12.0–15.0)
Immature Granulocytes: 1 %
Lymphocytes Relative: 9 %
Lymphs Abs: 1.1 10*3/uL (ref 0.7–4.0)
MCH: 23.9 pg — ABNORMAL LOW (ref 26.0–34.0)
MCHC: 29.2 g/dL — ABNORMAL LOW (ref 30.0–36.0)
MCV: 82 fL (ref 80.0–100.0)
Monocytes Absolute: 0.7 10*3/uL (ref 0.1–1.0)
Monocytes Relative: 6 %
Neutro Abs: 8.4 10*3/uL — ABNORMAL HIGH (ref 1.7–7.7)
Neutrophils Relative %: 73 %
Platelets: 334 10*3/uL (ref 150–400)
RBC: 2.05 MIL/uL — ABNORMAL LOW (ref 3.87–5.11)
RDW: 21.2 % — ABNORMAL HIGH (ref 11.5–15.5)
WBC: 11.4 10*3/uL — ABNORMAL HIGH (ref 4.0–10.5)
nRBC: 0 % (ref 0.0–0.2)

## 2021-01-05 LAB — COMPREHENSIVE METABOLIC PANEL
ALT: 10 U/L (ref 0–44)
AST: 20 U/L (ref 15–41)
Albumin: 2.2 g/dL — ABNORMAL LOW (ref 3.5–5.0)
Alkaline Phosphatase: 105 U/L (ref 38–126)
Anion gap: 11 (ref 5–15)
BUN: 8 mg/dL (ref 6–20)
CO2: 32 mmol/L (ref 22–32)
Calcium: 8.2 mg/dL — ABNORMAL LOW (ref 8.9–10.3)
Chloride: 93 mmol/L — ABNORMAL LOW (ref 98–111)
Creatinine, Ser: 4.51 mg/dL — ABNORMAL HIGH (ref 0.44–1.00)
GFR, Estimated: 12 mL/min — ABNORMAL LOW (ref >60)
Glucose, Bld: 142 mg/dL — ABNORMAL HIGH (ref 70–99)
Potassium: 2.9 mmol/L — ABNORMAL LOW (ref 3.5–5.1)
Sodium: 136 mmol/L (ref 135–145)
Total Bilirubin: 0.4 mg/dL (ref 0.3–1.2)
Total Protein: 7.7 g/dL (ref 6.5–8.1)

## 2021-01-05 LAB — I-STAT CHEM 8, ED
BUN: 7 mg/dL (ref 6–20)
Calcium, Ion: 0.96 mmol/L — ABNORMAL LOW (ref 1.15–1.40)
Chloride: 94 mmol/L — ABNORMAL LOW (ref 98–111)
Creatinine, Ser: 4.7 mg/dL — ABNORMAL HIGH (ref 0.44–1.00)
Glucose, Bld: 143 mg/dL — ABNORMAL HIGH (ref 70–99)
HCT: 18 % — ABNORMAL LOW (ref 36.0–46.0)
Hemoglobin: 6.1 g/dL — CL (ref 12.0–15.0)
Potassium: 2.9 mmol/L — ABNORMAL LOW (ref 3.5–5.1)
Sodium: 138 mmol/L (ref 135–145)
TCO2: 34 mmol/L — ABNORMAL HIGH (ref 22–32)

## 2021-01-05 LAB — I-STAT BETA HCG BLOOD, ED (MC, WL, AP ONLY): I-stat hCG, quantitative: <5 m[IU]/mL (ref <5)

## 2021-01-05 LAB — PREPARE RBC (CROSSMATCH)

## 2021-01-05 LAB — SARS CORONAVIRUS 2 (TAT 6-24 HRS): SARS Coronavirus 2: NEGATIVE

## 2021-01-05 MED ORDER — DROSPIRENONE 4 MG PO TABS: 1.0000 | ORAL_TABLET | Freq: Every day | ORAL | Status: DC

## 2021-01-05 MED ORDER — CALCIUM CARBONATE ANTACID 500 MG PO CHEW
1.0000 | CHEWABLE_TABLET | Freq: Three times a day (TID) | ORAL | Status: DC
  Administered 2021-01-06 – 2021-01-07 (×2): 200 mg via ORAL
  Filled 2021-01-05 (×3): qty 1

## 2021-01-05 MED ORDER — VITAMIN B-12 1000 MCG PO TABS
1000.0000 ug | ORAL_TABLET | Freq: Every day | ORAL | Status: DC
  Administered 2021-01-06 – 2021-01-07 (×2): 1000 ug via ORAL
  Filled 2021-01-05 (×2): qty 1

## 2021-01-05 MED ORDER — MIRTAZAPINE 15 MG PO TABS
15.0000 mg | ORAL_TABLET | Freq: Every day | ORAL | Status: DC
  Administered 2021-01-05 – 2021-01-06 (×2): 15 mg via ORAL
  Filled 2021-01-05 (×3): qty 1

## 2021-01-05 MED ORDER — MYCOPHENOLATE SODIUM 180 MG PO TBEC
180.0000 mg | DELAYED_RELEASE_TABLET | Freq: Every evening | ORAL | Status: DC
  Administered 2021-01-05 – 2021-01-06 (×2): 180 mg via ORAL
  Filled 2021-01-05 (×4): qty 1

## 2021-01-05 MED ORDER — PREDNISONE 5 MG PO TABS
5.0000 mg | ORAL_TABLET | Freq: Every day | ORAL | Status: DC
  Administered 2021-01-06 – 2021-01-07 (×2): 5 mg via ORAL
  Filled 2021-01-05 (×2): qty 1

## 2021-01-05 MED ORDER — VITAMIN D 25 MCG (1000 UNIT) PO TABS
1000.0000 [IU] | ORAL_TABLET | Freq: Every morning | ORAL | Status: DC
  Administered 2021-01-06 – 2021-01-07 (×2): 1000 [IU] via ORAL
  Filled 2021-01-05 (×2): qty 1

## 2021-01-05 MED ORDER — SODIUM CHLORIDE 0.9% IV SOLUTION: Freq: Once | INTRAVENOUS | Status: AC

## 2021-01-05 MED ORDER — CALCITRIOL 0.25 MCG PO CAPS
0.2500 ug | ORAL_CAPSULE | Freq: Every day | ORAL | Status: DC
  Administered 2021-01-06 – 2021-01-07 (×2): 0.25 ug via ORAL
  Filled 2021-01-05 (×2): qty 1

## 2021-01-05 MED ORDER — VITAMIN B12 1000 MCG PO TBCR: 1000.0000 ug | EXTENDED_RELEASE_TABLET | Freq: Every day | ORAL | Status: DC

## 2021-01-05 MED ORDER — MYCOPHENOLATE SODIUM 180 MG PO TBEC
360.0000 mg | DELAYED_RELEASE_TABLET | Freq: Every morning | ORAL | Status: DC
  Administered 2021-01-06 – 2021-01-07 (×2): 360 mg via ORAL
  Filled 2021-01-05 (×2): qty 2

## 2021-01-05 MED ORDER — SODIUM CHLORIDE 0.9 % IV SOLN
10.0000 mL/h | Freq: Once | INTRAVENOUS | Status: AC
  Administered 2021-01-05: 10 mL/h via INTRAVENOUS

## 2021-01-05 MED ORDER — POTASSIUM CHLORIDE CRYS ER 20 MEQ PO TBCR
20.0000 meq | EXTENDED_RELEASE_TABLET | Freq: Once | ORAL | Status: AC
  Administered 2021-01-05: 20 meq via ORAL
  Filled 2021-01-05: qty 1

## 2021-01-05 MED ORDER — HEPARIN SODIUM (PORCINE) 5000 UNIT/ML IJ SOLN: 5000.0000 [IU] | Freq: Three times a day (TID) | INTRAMUSCULAR | Status: DC

## 2021-01-05 MED ORDER — RENA-VITE PO TABS
1.0000 | ORAL_TABLET | Freq: Every day | ORAL | Status: DC
  Administered 2021-01-06: 1 via ORAL
  Filled 2021-01-05: qty 1

## 2021-01-05 MED ORDER — MAGNESIUM OXIDE -MG SUPPLEMENT 400 (240 MG) MG PO TABS
200.0000 mg | ORAL_TABLET | Freq: Two times a day (BID) | ORAL | Status: DC
  Administered 2021-01-05 – 2021-01-07 (×4): 200 mg via ORAL
  Filled 2021-01-05 (×4): qty 1

## 2021-01-05 NOTE — ED Triage Notes (Signed)
Pt reports she was at dialysis this morning when she was told she has a hemoglobin of 4.6. Pt completed full session of dialysis. Pt reports fatigue and sob with exertion that has been going on for 1 week. Denies pain. Pt tachycardiac in triage and reports feeling palpitations.

## 2021-01-05 NOTE — ED Provider Notes (Signed)
Emergency Medicine Provider Triage Evaluation Note  Kerry Cook , a 35 y.o. female  was evaluated in triage.  Pt complains of abnormal lab result.  Patient states that she was at dialysis morning so that her hemoglobin was 4.6.  Patient has a history of anemia.  Patient endorses fatigue and exertional shortness of breath x1 week.  Patient denies any blood in stool, melena, hematuria, vaginal bleeding.  LMP 7/4.  Patient endorses menorrhagia.  Review of Systems  Positive: Exertional shortness of breath, fatigue Negative: Fever, chills, chest pain  Physical Exam  BP 126/75   Pulse (!) 123   Temp 98.8 F (37.1 C) (Oral)   Resp 16   Ht 5' (1.524 m)   Wt 63.5 kg   LMP 12/30/2020   SpO2 96%   BMI 27.34 kg/m  Gen:   Awake, no distress   Resp:  Normal effort, lungs clear to auscultation bilaterally MSK:   Moves extremities without difficulty  Other:  Fistula to left forearm, palpable thrill, no surrounding erythema  Medical Decision Making  Medically screening exam initiated at 12:10 PM.  Appropriate orders placed.  Kerry Cook was informed that the remainder of the evaluation will be completed by another provider, this initial triage assessment does not replace that evaluation, and the importance of remaining in the ED until their evaluation is complete.  Will have patient moved back to next available room   Dyann Ruddle 01/05/21 Ford, Kathleen, DO 01/05/21 1622

## 2021-01-05 NOTE — Hospital Course (Addendum)
Kerry Cook is 35 y.o. 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 Patient presented from from dialysis center with a week long shortness of breath, fatigue, difficulty concentrating and tachycardia, though was able to tolerate full session of dialysis. Has had multiple admission for same problem, most recent admission was on 11/2020. Noted to have hemoglobin of 4.9 on admission. Systolic murmur noted on exam likely a flow murmur in setting of anemia. Her anemia is in context of recent heavy menstrual flow of six days duration. Has history of fibroids and has tried multiple contraceptive pills in the past and recently started taking slynd. Her baseline Hgb in 2021 was 10.5 but has been consistently averaged 7.5 in 2022. Etiology of her anemia is multifactorial, a combination of anemia of chronic disease and low EPO production in setting of ESRD and heavy blood loss from heavy menstrual flow.  Anemia responded well to two units of blood. She was given ferric gluconate 250 mg IV x2 per nephrology recommendation. She has been receiving full dose erythropoietin (ESA) with dialysis.  Hgb was *** on day of discharge. Patient told to follow up outpatient in one week to repeat blood count to ensure they are stable.  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 She follows with nephrology and transplant in [facility]. It appears patient is not taking medication appropriately and has had inconsistent follow  up. On medication reconciliation patient has been taking myfortic 180 mg BID . Pharmacy confirmed with [facility], patient should be taking myfortic '360mg'$  every morning and 180 mg every evening. Prednisone '5mg'$  daily so was put on the right dose during admission.  HTN On coreg '25mg'$  BID and nifedipine '90mg'$  daily at home. Was continued on home BP medications.   GERD -continued on home protonix '20mg'$  daily

## 2021-01-05 NOTE — ED Notes (Signed)
PT placed on 2L [place] for sats of 80% while sleeping.  PT easily arousible.

## 2021-01-05 NOTE — H&P (Signed)
Date: 01/05/2021               Patient Name:  Kerry Cook MRN: [id number]  DOB: 04/14/1986 Age / Sex: 35 y.o., female   PCP: Patient, No Pcp Per (Inactive)              Medical Service: Internal Medicine Teaching Service              Attending Physician: Dr. Sid Falcon, MD    First Contact: Vern Claude, MS 4 Pager: [phone]  Second Contact: Dr. 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 Over the past week, patient has had increasing shortness of breath, fatigue and low energy and difficulty concentrating.  Denies chest pain, dizziness or any other symptoms. Her last menstrual period was on July 4th to July 9th. Has a history of heavy menstrual bleed and reports using  6-7 pads per day during her just ended period. She has had several blood transfusions since diagnosis of renal failure. Patient recently started following up with OB/GYN but says she has not had formal work up for her menorrhagia. Chart review indicates history of fibroids and she has tried multiple contraceptives in the past and recently started [place]. In the ED, she was noted to be tired-appearing but in no acute distress. EKG unremarkable except for sinus tachycardia. 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  Meds:  Current Meds  Medication Sig   b complex-vitamin c-folic acid (NEPHRO-VITE) 0.8 MG TABS  tablet Take 1 tablet by mouth daily.   calcitRIOL (ROCALTROL) 0.25 MCG capsule Take 0.25 mcg by mouth daily.   carvedilol (COREG) 25 MG tablet Take 1 tablet (25 mg total) by mouth 2 (two) times daily with a meal.   Cholecalciferol (VITAMIN D3) 25 MCG (1000 UT) CAPS Take 1,000 Units by mouth in the morning.   Cyanocobalamin (VITAMIN B12) 1000 MCG TBCR Take 1,000 mcg by mouth daily.   Drospirenone (SLYND) 4 MG TABS Take 1 tablet by mouth daily with breakfast.   fluticasone (FLONASE) 50 MCG/ACT nasal spray Place 1 spray into both nostrils daily. (Patient taking differently: Place 1 spray into both nostrils daily as needed for allergies or rhinitis.)   MAG-200 200 MG TABS Take 200 mg by mouth 2 (two) times daily.   mirtazapine (REMERON) 15 MG tablet Take 15 mg by mouth in the morning and at bedtime.   mycophenolate (MYFORTIC) 180 MG EC tablet Take 180 mg by mouth 2 (two) times daily.   NIFEdipine (PROCARDIA XL/NIFEDICAL-XL) 90 MG 24 hr tablet Take 1 tablet (90 mg total) by mouth daily.   predniSONE (DELTASONE) 5 MG tablet Take 5 mg by mouth daily.   TUMS 500 MG chewable tablet Chew 1 tablet by mouth 3 (three) times daily with meals.     Allergies: Allergies as of 01/05/2021 - Review Complete 01/05/2021  Allergen Reaction Noted   Tape Itching and  Other (See Comments) 01/05/2021   Zosyn [piperacillin sod-tazobactam so] Itching 04/22/2017   Past Medical History:  Diagnosis Date   Chronic kidney disease    Dialysis patient [facility])    History of kidney transplant 2012   kidney failure due to hypertension   Hypertension    Multiple gastric ulcers    Pneumonia 01/23/2015    Family History: Unknown. 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 Never smoker and denies drugs or alcohol use  Review of Systems: A complete ROS was negative except as per HPI.   Vitals: Blood pressure (!) 155/103, pulse (!) 116,  temperature 98.4 F (36.9 C), temperature source Oral, resp. rate (!) 22, height 5' (1.524 m), weight 63.5 kg, last menstrual period 12/30/2020, SpO2 99 %. Physical exam General: tired-appearing female laying in bed wearing supplemental oxygen, in no acute distress. HEENT: Normocephalic. Atraumatic. Pale under eyelids CV: RRR. Tachycardic, flow murmurs murmur appreciated. No rubs, or gallops. No LE edema Pulmonary: Lungs CTAB. Normal effort. No wheezing or rales. Abdominal: Soft, nontender, nondistended. Normal bowel sounds. Extremities: Palpable radial and DP pulses. Normal ROM. Skin: Warm and dry. No obvious rash or lesions. Neuro: A&Ox3. Moves all extremities. Normal sensation. No focal deficit. 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 Hemoglobin of 4.9 on admission. Systolic murmur on exam likely a flow murmur in setting of anemia. Had recent admission on 11/2020 for similar problem. Able to tolerate full session dialysis today and was told her hemoglobin was low. Her anemia is in context of recent heavy menstrual flow of five days duration. Has history of fibroids and has tried multiple contraceptive pills in the past and recently started taking slynd.  Her baseline Hgb in 2021 was 10.5 but has been consistently averaged 7.5 in 2022. Etiology of her anemia is multifactorial, a combination of anemia of chronic disease and low EPO production in setting of ESRD and heavy blood loss from heavy menstrual flow. Would consult nephro for consideration of  starting EPO given patient's history of severe HTN.  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 She follows with nephrology and transplant in [facility]. On medication reconciliation patient has been taking myfortic 180 mg BID . Pharmacy confirmed with [facility], patient should be taking myfortic '360mg'$  every morning and 180 mg every evening. Prednisone '5mg'$  daily. -Continue home regimen -Completed full HD session today 7/15  Hypokalemia K of 2.9 on admission but with no EKG changes. Would replete caustiously given ESRD. -one time doe of potassium chloride 27mq PO   HTN On coreg '25mg'$  BID and nifedipine '90mg'$  qhs at home. -hold home medications , can restart in the morning if patient continues to have normal BP.    GERD -continue  home protonix '20mg'$  daily   Dispo: Admit patient to Observation with expected length of stay less than 2 midnights.  Signed: Ross Ludwig, Medical Student 01/05/2021, 4:24 PM  Pager: [phone]   Attestation for Student Documentation:  I personally was present and performed or re-performed the history, physical exam and medical decision-making activities of this service and have verified that the service and findings are accurately documented in the student's note.  Madalyn Rob, MD 01/05/2021, 6:24 PM

## 2021-01-05 NOTE — ED Provider Notes (Signed)
[facility] EMERGENCY DEPARTMENT Provider Note   CSN: [id number] Arrival date & time: 01/05/21  1140     History Chief Complaint  Patient presents with   Abnormal Lab    Kerry Cook is a 35 y.o. female.  HPI  35 year old female with past medical history of ESRD on HD every MWF, HTN, kidney transplant, anemia secondary to heavy periods/iron deficiency anemia/anemia of kidney disease presents to the emergency department with concern for anemia.  Patient states over the last week she has been short of breath with episodes of lightheadedness.  Denies any chest pain.  She was recently admitted a couple weeks ago for symptomatic anemia.  Patient states when they drew her blood she was noted to be anemic at dialysis.  She did receive her full dialysis session today but came here for reevaluation.  No recent fever or illness.  Patient states she just completed her menstrual period about 5 days ago and this was an unusually heavy period as well.  Denies any bleeding in the stool/black stool.  Past Medical History:  Diagnosis Date   Chronic kidney disease    Dialysis patient [facility])    History of kidney transplant 2012   kidney failure due to hypertension   Hypertension    Multiple gastric ulcers    Pneumonia 01/23/2015    Patient Active Problem List   Diagnosis Date Noted   Symptomatic anemia 12/13/2020   Medicare annual wellness visit, initial 10/31/2015   Hypertension    History of kidney transplant    Anemia in chronic kidney disease 10/01/2007   End stage renal disease on dialysis ([place]) 10/01/2007    Past Surgical History:  Procedure Laterality Date   ESOPHAGOGASTRODUODENOSCOPY N/A 04/01/2016   Procedure: ESOPHAGOGASTRODUODENOSCOPY (EGD);  Surgeon: Gatha Mayer, MD;  Location: [facility] ENDOSCOPY;  Service: Endoscopy;  Laterality: N/A;   ESOPHAGOGASTRODUODENOSCOPY (EGD) WITH PROPOFOL N/A 11/20/2016   Procedure: ESOPHAGOGASTRODUODENOSCOPY (EGD) WITH PROPOFOL;   Surgeon: Doran Stabler, MD;  Location: [facility];  Service: Endoscopy;  Laterality: N/A;   KIDNEY TRANSPLANT Bilateral 2012     OB History     Gravida  1   Para      Term      Preterm      AB  1   Living  0      SAB  1   IAB      Ectopic      Multiple      Live Births              Family History  Problem Relation Age of Onset   Kidney disease Father    Lupus Maternal Grandmother    Cancer Maternal Grandfather    Colon cancer Neg Hx    Stomach cancer Neg Hx    Rectal cancer Neg Hx    Esophageal cancer Neg Hx    Liver cancer Neg Hx     Social History   Tobacco Use   Smoking status: Never   Smokeless tobacco: Never  Vaping Use   Vaping Use: Never used  Substance Use Topics   Alcohol use: No    Alcohol/week: 0.0 standard drinks   Drug use: No    Home Medications Prior to Admission medications   Medication Sig Start Date End Date Taking? Authorizing Provider  b complex-vitamin c-folic acid (NEPHRO-VITE) 0.8 MG TABS tablet Take 1 tablet by mouth daily. 10/24/14  Yes [provider]  calcitRIOL (ROCALTROL) 0.25 MCG capsule  Take 0.25 mcg by mouth daily.   Yes [provider]  carvedilol (COREG) 25 MG tablet Take 1 tablet (25 mg total) by mouth 2 (two) times daily with a meal. 12/14/20  Yes Virl Axe, MD  Cholecalciferol (VITAMIN D3) 25 MCG (1000 UT) CAPS Take 1,000 Units by mouth in the morning.   Yes [provider]  Cyanocobalamin (VITAMIN B12) 1000 MCG TBCR Take 1,000 mcg by mouth daily.   Yes [provider]  Drospirenone (SLYND) 4 MG TABS Take 1 tablet by mouth daily with breakfast. 12/11/20  Yes Shelly Bombard, MD  fluticasone (FLONASE) 50 MCG/ACT nasal spray Place 1 spray into both nostrils daily. Patient taking differently: Place 1 spray into both nostrils daily as needed for allergies or rhinitis. 07/12/18  Yes Murray, [name], PA-C  MAG-200 200 MG TABS Take 200 mg by mouth 2 (two) times daily.  11/02/20  Yes [provider]  mirtazapine (REMERON) 15 MG tablet Take 15 mg by mouth in the morning and at bedtime. 11/01/20 11/01/21 Yes [provider]  mycophenolate (MYFORTIC) 180 MG EC tablet Take 180 mg by mouth 2 (two) times daily. 01/15/18  Yes [provider]  NIFEdipine (PROCARDIA XL/NIFEDICAL-XL) 90 MG 24 hr tablet Take 1 tablet (90 mg total) by mouth daily. 12/15/20 01/14/21 Yes Virl Axe, MD  predniSONE (DELTASONE) 5 MG tablet Take 5 mg by mouth daily. 01/15/18  Yes [provider]  TUMS 500 MG chewable tablet Chew 1 tablet by mouth 3 (three) times daily with meals. 11/02/20  Yes [provider]  tinidazole (TINDAMAX) 500 MG tablet Take 2 tablets (1,000 mg total) by mouth daily with breakfast. Patient not taking: Reported on 01/05/2021 12/11/20   Shelly Bombard, MD  amLODipine (NORVASC) 10 MG tablet Take 1 tablet (10 mg total) by mouth every evening. Patient not taking: No sig reported 09/13/15 12/14/20  [name], MD  labetalol (NORMODYNE) 200 MG tablet Take 1 tablet (200 mg total) by mouth 2 (two) times daily. Patient not taking: No sig reported 04/02/16 12/14/20  Elgergawy, Silver Huguenin, MD    Allergies    Tape and Zosyn [piperacillin sod-tazobactam so]  Review of Systems   Review of Systems  Constitutional:  Positive for fatigue. Negative for chills and fever.  HENT:  Negative for congestion.   Eyes:  Negative for visual disturbance.  Respiratory:  Positive for shortness of breath. Negative for chest tightness.   Cardiovascular:  Negative for chest pain, palpitations and leg swelling.  Gastrointestinal:  Negative for abdominal pain, diarrhea and vomiting.  Genitourinary:  Negative for dysuria.  Skin:  Negative for rash.  Neurological:  Positive for light-headedness. Negative for headaches.   Physical Exam Updated Vital Signs BP (!) 134/96   Pulse (!) 115   Temp 99.1 F (37.3 C) (Oral)   Resp (!) 25   Ht 5' (1.524 m)    Wt 63.5 kg   LMP 12/30/2020   SpO2 94%   BMI 27.34 kg/m   Physical Exam Vitals and nursing note reviewed.  Constitutional:      Appearance: Normal appearance. She is not toxic-appearing.  HENT:     Head: Normocephalic.     Mouth/Throat:     Mouth: Mucous membranes are moist.  Cardiovascular:     Rate and Rhythm: Tachycardia present.  Pulmonary:     Effort: Pulmonary effort is normal. No respiratory distress.  Abdominal:     Palpations: Abdomen is soft.     Tenderness: There  is no abdominal tenderness.  Skin:    General: Skin is warm.  Neurological:     Mental Status: She is alert and oriented to person, place, and time. Mental status is at baseline.  Psychiatric:        Mood and Affect: Mood normal.    ED Results / Procedures / Treatments   Labs (all labs ordered are listed, but only abnormal results are displayed) Labs Reviewed  CBC WITH DIFFERENTIAL/PLATELET - Abnormal; Notable for the following components:      Result Value   WBC 11.4 (*)    RBC 2.05 (*)    Hemoglobin 4.9 (*)    HCT 16.8 (*)    MCH 23.9 (*)    MCHC 29.2 (*)    RDW 21.2 (*)    Neutro Abs 8.4 (*)    Eosinophils Absolute 1.2 (*)    All other components within normal limits  COMPREHENSIVE METABOLIC PANEL - Abnormal; Notable for the following components:   Potassium 2.9 (*)    Chloride 93 (*)    Glucose, Bld 142 (*)    Creatinine, Ser 4.51 (*)    Calcium 8.2 (*)    Albumin 2.2 (*)    GFR, Estimated 12 (*)    All other components within normal limits  I-STAT CHEM 8, ED - Abnormal; Notable for the following components:   Potassium 2.9 (*)    Chloride 94 (*)    Creatinine, Ser 4.70 (*)    Glucose, Bld 143 (*)    Calcium, Ion 0.96 (*)    TCO2 34 (*)    Hemoglobin 6.1 (*)    HCT 18.0 (*)    All other components within normal limits  SARS CORONAVIRUS 2 (TAT 6-24 HRS)  I-STAT BETA HCG BLOOD, ED ([facility], [facility], [facility] ONLY)  TYPE AND SCREEN  PREPARE RBC (CROSSMATCH)    EKG EKG  Interpretation  Date/Time:  Friday January 05 2021 11:46:46 EDT Ventricular Rate:  119 PR Interval:  124 QRS Duration: 82 QT Interval:  330 QTC Calculation: 464 R Axis:   67 Text Interpretation: Sinus tachycardia ST & T wave abnormality, consider inferolateral ischemia Abnormal ECG sinus tachycardia, ST and T wave changes have been present previous Confirmed by Lavenia Atlas [phone]) on 01/05/2021 12:53:11 PM  Radiology No results found.  Procedures .Critical Care  Date/Time: 01/05/2021 2:48 PM Performed by: Lorelle Gibbs, DO Authorized by: Lorelle Gibbs, DO   Critical care provider statement:    Critical care time (minutes):  45   Critical care was necessary to treat or prevent imminent or life-threatening deterioration of the following conditions:  Circulatory failure   Critical care was time spent personally by me on the following activities:  Discussions with consultants, evaluation of patient's response to treatment, examination of patient, ordering and performing treatments and interventions, ordering and review of laboratory studies, ordering and review of radiographic studies, pulse oximetry, re-evaluation of patient's condition, obtaining history from patient or surrogate and review of old charts   I assumed direction of critical care for this patient from another provider in my specialty: no     Care discussed with: admitting provider     Medications Ordered in ED Medications  0.9 %  sodium chloride infusion (has no administration in time range)    ED Course  I have reviewed the triage vital signs and the nursing notes.  Pertinent labs & imaging results that were available during my care of the patient were reviewed by me and  considered in my medical decision making (see chart for details).    MDM Rules/Calculators/A&P                          35 year old female presents to the emergency department with concern for symptomatic anemia.  Patient's hemoglobin today  is 4.9, her baseline appears to be around 8-9.  Anemia is contributed to iron deficiency, chronic kidney disease and heavy menstrual periods.  She does had a heavy menstrual period conclude about 4 days ago.  Patient is tachycardic on arrival but otherwise in no distress.  Patient did receive her full dialysis session today.  Repeat blood work here confirms a hemoglobin of 4.9.  Plan for admission and transfusion in the setting of end-stage renal disease.  Patients evaluation and results requires admission for further treatment and care. Patient agrees with admission plan, offers no new complaints and is stable/unchanged at time of admit.  Final Clinical Impression(s) / ED Diagnoses Final diagnoses:  None    Rx / DC Orders ED Discharge Orders     None        Lorelle Gibbs, DO 01/05/21 1448

## 2021-01-05 NOTE — ED Notes (Signed)
Reached out to [facility] MD x 2 regarding if pt is going to get 2nd unit of blood or just 1 unit of blood. Awaiting response

## 2021-01-05 NOTE — ED Notes (Signed)
Paged IMTS resident to clarify if pt is going to get a 2nd unit of blood per report from previous RN. Awaiting response.

## 2021-01-05 NOTE — ED Notes (Signed)
Per Court Joy MD, pt to receive a total of 2 units of blood at this time. Provider to place order for 2nd unit.

## 2021-01-06 DIAGNOSIS — Z91048 Other nonmedicinal substance allergy status: Secondary | ICD-10-CM | POA: Diagnosis not present

## 2021-01-06 DIAGNOSIS — Z9114 Patient's other noncompliance with medication regimen: Secondary | ICD-10-CM | POA: Diagnosis not present

## 2021-01-06 DIAGNOSIS — D84821 Immunodeficiency due to drugs: Secondary | ICD-10-CM | POA: Diagnosis present

## 2021-01-06 DIAGNOSIS — E876 Hypokalemia: Secondary | ICD-10-CM | POA: Diagnosis present

## 2021-01-06 DIAGNOSIS — R0602 Shortness of breath: Secondary | ICD-10-CM | POA: Diagnosis present

## 2021-01-06 DIAGNOSIS — N2581 Secondary hyperparathyroidism of renal origin: Secondary | ICD-10-CM | POA: Diagnosis present

## 2021-01-06 DIAGNOSIS — N186 End stage renal disease: Secondary | ICD-10-CM | POA: Diagnosis present

## 2021-01-06 DIAGNOSIS — K219 Gastro-esophageal reflux disease without esophagitis: Secondary | ICD-10-CM | POA: Diagnosis present

## 2021-01-06 DIAGNOSIS — D62 Acute posthemorrhagic anemia: Secondary | ICD-10-CM | POA: Diagnosis present

## 2021-01-06 DIAGNOSIS — B259 Cytomegaloviral disease, unspecified: Secondary | ICD-10-CM | POA: Diagnosis present

## 2021-01-06 DIAGNOSIS — Z8711 Personal history of peptic ulcer disease: Secondary | ICD-10-CM | POA: Diagnosis not present

## 2021-01-06 DIAGNOSIS — Z7952 Long term (current) use of systemic steroids: Secondary | ICD-10-CM | POA: Diagnosis not present

## 2021-01-06 DIAGNOSIS — N051 Unspecified nephritic syndrome with focal and segmental glomerular lesions: Secondary | ICD-10-CM | POA: Diagnosis present

## 2021-01-06 DIAGNOSIS — D649 Anemia, unspecified: Secondary | ICD-10-CM | POA: Diagnosis present

## 2021-01-06 DIAGNOSIS — N920 Excessive and frequent menstruation with regular cycle: Secondary | ICD-10-CM | POA: Diagnosis present

## 2021-01-06 DIAGNOSIS — Y838 Other surgical procedures as the cause of abnormal reaction of the patient, or of later complication, without mention of misadventure at the time of the procedure: Secondary | ICD-10-CM | POA: Diagnosis present

## 2021-01-06 DIAGNOSIS — R5383 Other fatigue: Secondary | ICD-10-CM | POA: Diagnosis present

## 2021-01-06 DIAGNOSIS — I10 Essential (primary) hypertension: Secondary | ICD-10-CM | POA: Diagnosis present

## 2021-01-06 DIAGNOSIS — D631 Anemia in chronic kidney disease: Secondary | ICD-10-CM | POA: Diagnosis present

## 2021-01-06 DIAGNOSIS — R000 Tachycardia, unspecified: Secondary | ICD-10-CM | POA: Diagnosis present

## 2021-01-06 DIAGNOSIS — Z79899 Other long term (current) drug therapy: Secondary | ICD-10-CM | POA: Diagnosis not present

## 2021-01-06 DIAGNOSIS — Z841 Family history of disorders of kidney and ureter: Secondary | ICD-10-CM | POA: Diagnosis not present

## 2021-01-06 DIAGNOSIS — Z20822 Contact with and (suspected) exposure to covid-19: Secondary | ICD-10-CM | POA: Diagnosis present

## 2021-01-06 DIAGNOSIS — T8612 Kidney transplant failure: Secondary | ICD-10-CM | POA: Diagnosis present

## 2021-01-06 DIAGNOSIS — Z992 Dependence on renal dialysis: Secondary | ICD-10-CM | POA: Diagnosis not present

## 2021-01-06 LAB — BASIC METABOLIC PANEL WITH GFR
Anion gap: 11 (ref 5–15)
BUN: 13 mg/dL (ref 6–20)
CO2: 30 mmol/L (ref 22–32)
Calcium: 8.4 mg/dL — ABNORMAL LOW (ref 8.9–10.3)
Chloride: 95 mmol/L — ABNORMAL LOW (ref 98–111)
Creatinine, Ser: 7.12 mg/dL — ABNORMAL HIGH (ref 0.44–1.00)
GFR, Estimated: 7 mL/min — ABNORMAL LOW
Glucose, Bld: 90 mg/dL (ref 70–99)
Potassium: 3.1 mmol/L — ABNORMAL LOW (ref 3.5–5.1)
Sodium: 136 mmol/L (ref 135–145)

## 2021-01-06 LAB — PREPARE RBC (CROSSMATCH)

## 2021-01-06 LAB — HEMOGLOBIN AND HEMATOCRIT, BLOOD
HCT: 18.5 % — ABNORMAL LOW (ref 36.0–46.0)
HCT: 19.3 % — ABNORMAL LOW (ref 36.0–46.0)
HCT: 25.5 % — ABNORMAL LOW (ref 36.0–46.0)
Hemoglobin: 5.7 g/dL — CL (ref 12.0–15.0)
Hemoglobin: 5.8 g/dL — CL (ref 12.0–15.0)
Hemoglobin: 8 g/dL — ABNORMAL LOW (ref 12.0–15.0)

## 2021-01-06 MED ORDER — SODIUM CHLORIDE 0.9 % IV SOLN: 510.0000 mg | Freq: Once | INTRAVENOUS | Status: DC

## 2021-01-06 MED ORDER — SODIUM CHLORIDE 0.9% IV SOLUTION: Freq: Once | INTRAVENOUS | Status: AC

## 2021-01-06 MED ORDER — SODIUM CHLORIDE 0.9 % IV SOLN
250.0000 mg | Freq: Every day | INTRAVENOUS | Status: AC
  Administered 2021-01-06 – 2021-01-07 (×2): 250 mg via INTRAVENOUS
  Filled 2021-01-06 (×2): qty 20

## 2021-01-06 MED ORDER — POTASSIUM CHLORIDE 20 MEQ PO PACK
20.0000 meq | PACK | Freq: Two times a day (BID) | ORAL | Status: AC
  Administered 2021-01-06: 20 meq via ORAL
  Filled 2021-01-06: qty 1

## 2021-01-06 MED ORDER — CARVEDILOL 25 MG PO TABS
25.0000 mg | ORAL_TABLET | Freq: Two times a day (BID) | ORAL | Status: DC
  Administered 2021-01-06 – 2021-01-07 (×3): 25 mg via ORAL
  Filled 2021-01-06 (×4): qty 1

## 2021-01-06 MED ORDER — NIFEDIPINE ER OSMOTIC RELEASE 90 MG PO TB24
90.0000 mg | ORAL_TABLET | Freq: Every day | ORAL | Status: DC
  Administered 2021-01-06 – 2021-01-07 (×2): 90 mg via ORAL
  Filled 2021-01-06 (×2): qty 1

## 2021-01-06 NOTE — Progress Notes (Signed)
   01/06/21 0907  Assess: MEWS Score  Temp 98.1 F (36.7 C)  BP (!) 151/100  Pulse Rate (!) 117  ECG Heart Rate (!) 117  Resp 18  SpO2 95 %  O2 Device Room Air  Assess: MEWS Score  MEWS Temp 0  MEWS Systolic 0  MEWS Pulse 2  MEWS RR 0  MEWS LOC 0  MEWS Score 2  MEWS Score Color Yellow  Assess: if the MEWS score is Yellow or Red  Were vital signs taken at a resting state? Yes  Focused Assessment No change from prior assessment  Early Detection of Sepsis Score *See Row Information* Low  MEWS guidelines implemented *See Row Information* Yes  Treat  MEWS Interventions Administered scheduled meds/treatments  Take Vital Signs  Increase Vital Sign Frequency  Yellow: Q 2hr X 2 then Q 4hr X 2, if remains yellow, continue Q 4hrs  Document  Patient Outcome Other (Comment) (remains stable)  Progress note created (see row info) Yes

## 2021-01-06 NOTE — Progress Notes (Signed)
Subjective:  Patient says she is feeling better and no longer has shortness of breath or feeling like her heart is racing (tachycardia). Has had iron infusions  in the past with no adverse side effects. Wants to know when she can go home.  Objective:  Vital signs in last 24 hours: Vitals:   01/06/21 0841 01/06/21 0907 01/06/21 1109 01/06/21 1146  BP: (!) 139/94 (!) 151/100 (!) 129/92 (!) 142/91  Pulse: (!) 114 (!) 117  96  Resp: '18 18 19 18  '$ Temp: 98 F (36.7 C) 98.1 F (36.7 C) 98.5 F (36.9 C) 98.2 F (36.8 C)  TempSrc: Oral Oral Oral Oral  SpO2: 98% 95%  91%  Weight:      Height:       Weight change:   Intake/Output Summary (Last 24 hours) at 01/06/2021 1224 Last data filed at 01/06/2021 1140 Gross per 24 hour  Intake 751 ml  Output --  Net 751 ml   CBC Latest Ref Rng & Units 01/06/2021 01/06/2021 01/05/2021  WBC 4.0 - 10.5 K/uL - - -  Hemoglobin 12.0 - 15.0 g/dL 5.8(LL) 5.7(LL) 6.1(LL)  Hematocrit 36.0 - 46.0 % 19.3(L) 18.5(L) 18.0(L)  Platelets 150 - 400 K/uL - - -    BMP Latest Ref Rng & Units 01/06/2021 01/05/2021 01/05/2021  Glucose 70 - 99 mg/dL 90 143(H) 142(H)  BUN 6 - 20 mg/dL '13 7 8  '$ Creatinine 0.44 - 1.00 mg/dL 7.12(H) 4.70(H) 4.51(H)  Sodium 135 - 145 mmol/L 136 138 136  Potassium 3.5 - 5.1 mmol/L 3.1(L) 2.9(L) 2.9(L)  Chloride 98 - 111 mmol/L 95(L) 94(L) 93(L)  CO2 22 - 32 mmol/L 30 - 32  Calcium 8.9 - 10.3 mg/dL 8.4(L) - 8.2(L)      Assessment/Plan:  Active Problems:   Symptomatic anemia  Summary statement Verle Pattan is a 35 year old female with history 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  S/p 2 units of blood and is doing well. Anticipate discharge tomorrow 7/17 after iron and blood transfusion today.   Symptomatic Anemia due to Menstrual blood loss. Patient  is asymptomatic and feeling better this morning after blood transfusion. Present with a hemoglobin of 4.9. Appropriate Hgb response  to 5.7 after first unit. Nephro following, appreciate help. 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 She follows with nephrology and transplant in [facility]. It appears patient is not taking medication appropriately and has had inconsistent follow up. On medication reconciliation patient has been taking myfortic 180 mg BID . Pharmacy confirmed with [facility], patient should be taking myfortic '360mg'$  every morning and 180 mg every evening. Prednisone '5mg'$  daily. -Continue home regimen -Completed full HD session today 7/15   Hypokalemia K of 2.9 on admission but with no EKG changes. Repleted caustiously given ESRD.K now 3.1 - potassium chloride 77mq PO BID   HTN On coreg '25mg'$  BID and nifedipine '90mg'$  qhs at home. -resumed  home medications BP.    GERD -continue home protonix '20mg'$  daily   LOS: 0 days   Coltin Casher, [employer]  01/06/2021, 12:24 PM

## 2021-01-06 NOTE — Progress Notes (Signed)
Ok to substitute Feraheme with Ferrlecit per Dr. Laural Golden.  Ferrlecit '250mg'$  IV q24 x2  Onnie Boer, PharmD, [place], AAHIVP, CPP Infectious Disease Pharmacist 01/06/2021 12:46 PM

## 2021-01-06 NOTE — Progress Notes (Signed)
  Date: 01/06/2021  Patient name: Kerry Cook  Medical record number: [id number]  Date of birth: 1985/12/22   I have seen and evaluated Kerry Cook and discussed their care with the Residency Team. Briefly, Kerry Cook is a 35 year old woman with PMH of ESRD s/p 2 failed transplants on immunosuppressant therapy, on HD, CMV viremia, HTN, GERD, PUD, IDA and heavy menstrual bleeding who was noted to be severely anemic at HD.  She was symptomatic with fatigue, SOB with exertion and tachycardia.  She has improved this morning with 1 unit of blood.  She remains with a low Hgb around 5.7 and requires another transfusion today.   Vitals:   01/06/21 1109 01/06/21 1146  BP: (!) 129/92 (!) 142/91  Pulse:  96  Resp: 19 18  Temp: 98.5 F (36.9 C) 98.2 F (36.8 C)  SpO2:  91%   Gen: Lying in bed, no distress HENT: Neck is supple, MMM CV: RR, NR, no murmur noted Pulm: CTAB, no wheezing Abd: Soft, NT, no warmth/pain over site of transplant in the LLQ MSK: normal tone bulk, blood transfusion ongoing Psych: Pleasant, reports feeling better.   Assessment and Plan: I have seen and evaluated the patient as outlined above. I agree with the formulated Assessment and Plan as detailed in the residents' note, with the following changes:   1. Acute symptomatic anemia, multifactorial due to acute blood loss (menstrual) and ESRD - Blood Transfusion X 2 - Nephrology consulted and will also give iron infusion today - Monitor H/H after transfusion - Monitor symptoms (already improving)  2. ESRD on HD - Nephrology consult for possible HD while in house, recommendations for further treatment of complicated anemia  Other issues per MS4 Kerry Cook's note.    Sid Falcon, MD 7/16/20221:23 PM

## 2021-01-06 NOTE — Progress Notes (Addendum)
35 yo Female with ESRD  sec  presumed HTN ,HD start 2009  (failed  Renal TX X2 (9//2012 [facility]  and 2019)-HD restart 10/2014 ,HO pregnancy  with Complication [id number] with   CKD 3/ renal TX restarted HD  10/2020 ( failed 2nd X 2/2 ?med adherence affordability  ) HTN  (  ho noncompliance with Meds) Mch 10/06-03/2016 Gastric Oulet Obstructon  EGD-"Mucosa causing obstruc Benign Path " Gastric Ulcer / Prepyloric Ulcer, Recent Heavy Menses ( Gyn 11/2020 started   on ocps for heavy menses-) Now admitted to observation with symptomatic anemia Hgb 4.9.  Noted seen in outpatient hemodialysis by Dr. Candiss Norse on 7/11 /22 review of the labs revealed hemoglobin 6.1 on 7/06 she was mildly symptomatic and Dr. Candiss Norse recommended coming to ER, patient refused .  Noted she is getting maximal ESA Mircera 225 mgc q. 2 weeks HD last given on 7/04 next due on 7/18.  Her last transferrin sat was low at 6% on 6/15, ferritin level was 764 on 11/24/20.  She had been given Venofer.  Outpatient July transferrin sat not done yet.  She has received Venofer in the past and tolerated Currently on room blood being transfused states she feels better.  Noted she has had dialysis yesterday on schedule.  Reports she will call and discuss with her GYN doctor on Monday that she is still having heavy menses on his medicine regimen.  OP HD orders= [name], MWF, 3 hours 45 minutes, EDW 62.5 kg 2K, 3.0 calcium bath UFP2 Heparin 3000 units Hectorol 5 mics q. dialysis  Mircera 225 MCG every 2 wks last given 7/04  Noted again in observation status we will do full consult with admission to in hospital status     Ernest Haber, PA-C [place] [phone] 01/06/2021,10:59 AM  LOS: 0 days   Pt seen, examined and agree w assess/plan as above with additions as indicated. Pt is already getting maximum ESA dosing at her OP dialysis unit. Only possible option we could suggest would be checking iron studies and  repleting if needed (goal tsat % on ESA is > 25-30%).   [facility] 01/06/2021, 9:08 PM    Labs: Basic Metabolic Panel: Recent Labs  Lab 01/05/21 1217 01/05/21 1234 01/06/21 0751  NA 136 138 136  K 2.9* 2.9* 3.1*  CL 93* 94* 95*  CO2 32  --  30  GLUCOSE 142* 143* 90  BUN '8 7 13  '$ CREATININE 4.51* 4.70* 7.12*  CALCIUM 8.2*  --  8.4*   Liver Function Tests: Recent Labs  Lab 01/05/21 1217  AST 20  ALT 10  ALKPHOS 105  BILITOT 0.4  PROT 7.7  ALBUMIN 2.2*   No results for input(s): LIPASE, AMYLASE in the last 168 hours. No results for input(s): AMMONIA in the last 168 hours. CBC: Recent Labs  Lab 01/05/21 1217 01/05/21 1234 01/06/21 0452 01/06/21 0751  WBC 11.4*  --   --   --   NEUTROABS 8.4*  --   --   --   HGB 4.9* 6.1* 5.7* 5.8*  HCT 16.8* 18.0* 18.5* 19.3*  MCV 82.0  --   --   --   PLT 334  --   --   --    Cardiac Enzymes: No results for input(s): CKTOTAL, CKMB, CKMBINDEX, TROPONINI in the last 168 hours. CBG: No results for input(s): GLUCAP in the last 168 hours.  Studies/Results: No results found. Medications:  calcitRIOL  0.25 mcg Oral Daily   calcium carbonate  1 tablet Oral TID WC   carvedilol  25 mg Oral BID WC   cholecalciferol  1,000 Units Oral q AM   magnesium oxide  200 mg Oral BID   mirtazapine  15 mg Oral QHS   multivitamin  1 tablet Oral QHS   mycophenolate  180 mg Oral QPM   mycophenolate  360 mg Oral q AM   NIFEdipine  90 mg Oral Daily   predniSONE  5 mg Oral Daily   vitamin B-12  1,000 mcg Oral Daily

## 2021-01-07 LAB — PREPARE RBC (CROSSMATCH)

## 2021-01-07 LAB — HEMOGLOBIN AND HEMATOCRIT, BLOOD
HCT: 22.2 % — ABNORMAL LOW (ref 36.0–46.0)
Hemoglobin: 6.8 g/dL — CL (ref 12.0–15.0)

## 2021-01-07 MED ORDER — DOXERCALCIFEROL 4 MCG/2ML IV SOLN: 5.0000 ug | INTRAVENOUS | Status: DC

## 2021-01-07 MED ORDER — FLUTICASONE PROPIONATE 50 MCG/ACT NA SUSP: 1.0000 | Freq: Every day | NASAL | Status: DC | PRN

## 2021-01-07 MED ORDER — SODIUM CHLORIDE 0.9% IV SOLUTION: Freq: Once | INTRAVENOUS | Status: AC

## 2021-01-07 MED ORDER — NEPRO/CARBSTEADY PO LIQD: 237.0000 mL | Freq: Two times a day (BID) | ORAL | Status: DC

## 2021-01-07 NOTE — Progress Notes (Signed)
Received critical lab result : HGB 6.8. Dr. Laural Golden notified.

## 2021-01-07 NOTE — Discharge Instructions (Signed)
Ms. Kerry Cook, Kerry Cook were hospitalized due to symptomatic anemia with a very low hemoglobin of 4.9.  He was transfused 2 units of blood and 1 unit of iron.  I believe that you are low blood counts are due to numerous different factors including iron deficiency, fibroids and your chronic kidney disease.  I want you to follow-up with our internal medicine clinic this week.  Someone from our office should call you to schedule an appointment.  Thank you for allowing [place] to take part in your care!

## 2021-01-07 NOTE — Progress Notes (Signed)
Patient was discharged from 5W11. IV d/c'd, skin intact, VS stable. Patient has all belongings. AVS reviewed and all questions answered. Will take pt via wheelchair to meet her ride at the [facility]	[place].

## 2021-01-07 NOTE — Consult Note (Signed)
[facility] Renal Consultation Note  Indication for Consultation:  Management of ESRD/hemodialysis; anemia, hypertension/volume and secondary hyperparathyroidism  HPI: Kerry Cook is a 35 y.o. female. with ESRD  sec  presumed HTN ,HD start 2009  (failed  Renal TX X2 (9//2012 WF  and 2019)-HD restart 10/2014 ,HO pregnancy  with Complication [id number] with   CKD 3/ renal TX restarted HD  10/2020 ( failed 2nd X 2/2 ?med adherence affordability  ) HTN  (  ho noncompliance with Meds) Mch 10/06-03/2016 Gastric Oulet Obstructon  EGD-"Mucosa causing obstruc Benign Path " Gastric Ulcer / Prepyloric Ulcer, Recent Heavy Menses ( Gyn 11/2020 started  on ocps for heavy menses-) Now admitted to with symptomatic anemia Hgb 4.9.  Noted seen in outpatient hemodialysis by Dr. Candiss Norse on 7/11 /22 review of the labs revealed hemoglobin 6.1 on 7/06 she was mildly symptomatic and Dr. Candiss Norse recommended coming to ER, patient refused .  Noted she is getting maximal ESA Mircera 225 mgc q. 2 weeks HD last given on 7/04 next due on 7/18.  Her last transferrin sat was low at 6% on 6/15, ferritin level was 764 on 11/24/20.  She had been given Venofer.  Outpatient July transferrin sat not done yet.  She has received Venofer in the past and tolerated .  Noted started on IV iron Ferrlecit yesterday and today, reports feeling better now, no shortness of breath, dizziness ,chest pain, and asking about discharge.  A.m. Hgb pending.  Yesterday 16: 39 =Hgb 8.0.      Past Medical History:  Diagnosis Date   Chronic kidney disease    Dialysis patient [facility])    History of kidney transplant 2012   kidney failure due to hypertension   Hypertension    Multiple gastric ulcers    Pneumonia 01/23/2015    Past Surgical History:  Procedure Laterality Date   ESOPHAGOGASTRODUODENOSCOPY N/A 04/01/2016   Procedure: ESOPHAGOGASTRODUODENOSCOPY (EGD);  Surgeon: Gatha Mayer, MD;  Location: [facility] ENDOSCOPY;  Service: Endoscopy;   Laterality: N/A;   ESOPHAGOGASTRODUODENOSCOPY (EGD) WITH PROPOFOL N/A 11/20/2016   Procedure: ESOPHAGOGASTRODUODENOSCOPY (EGD) WITH PROPOFOL;  Surgeon: Doran Stabler, MD;  Location: [facility];  Service: Endoscopy;  Laterality: N/A;   KIDNEY TRANSPLANT Bilateral 2012      Family History  Problem Relation Age of Onset   Kidney disease Father    Lupus Maternal Grandmother    Cancer Maternal Grandfather    Colon cancer Neg Hx    Stomach cancer Neg Hx    Rectal cancer Neg Hx    Esophageal cancer Neg Hx    Liver cancer Neg Hx       reports that she has never smoked. She has never used smokeless tobacco. She reports that she does not drink alcohol and does not use drugs.   Allergies  Allergen Reactions   Tape Itching and Other (See Comments)    Plastic/clear tape tears up the patient's skin   Zosyn [Piperacillin Sod-Tazobactam So] Itching    Has patient had a PCN reaction causing immediate rash, facial/tongue/throat swelling, SOB or lightheadedness with hypotension: No Has patient had a PCN reaction causing severe rash involving mucus membranes or skin necrosis: No Has patient had a PCN reaction that required hospitalization: No Has patient had a PCN reaction occurring within the last 10 years: No If all of the above answers are "NO", then may proceed with Cephalosporin use.     Prior to Admission medications   Medication Sig Start Date End  Date Taking? Authorizing Provider  b complex-vitamin c-folic acid (NEPHRO-VITE) 0.8 MG TABS tablet Take 1 tablet by mouth daily. 10/24/14  Yes [provider]  calcitRIOL (ROCALTROL) 0.25 MCG capsule Take 0.25 mcg by mouth daily.   Yes [provider]  carvedilol (COREG) 25 MG tablet Take 1 tablet (25 mg total) by mouth 2 (two) times daily with a meal. 12/14/20  Yes Virl Axe, MD  Cholecalciferol (VITAMIN D3) 25 MCG (1000 UT) CAPS Take 1,000 Units by mouth in the morning.   Yes [provider]  Cyanocobalamin  (VITAMIN B12) 1000 MCG TBCR Take 1,000 mcg by mouth daily.   Yes [provider]  Drospirenone (SLYND) 4 MG TABS Take 1 tablet by mouth daily with breakfast. 12/11/20  Yes Shelly Bombard, MD  fluticasone (FLONASE) 50 MCG/ACT nasal spray Place 1 spray into both nostrils daily. Patient taking differently: Place 1 spray into both nostrils daily as needed for allergies or rhinitis. 07/12/18  Yes Murray, [name], PA-C  MAG-200 200 MG TABS Take 200 mg by mouth 2 (two) times daily. 11/02/20  Yes [provider]  mirtazapine (REMERON) 15 MG tablet Take 15 mg by mouth in the morning and at bedtime. 11/01/20 11/01/21 Yes [provider]  mycophenolate (MYFORTIC) 180 MG EC tablet Take 180 mg by mouth 2 (two) times daily. 01/15/18  Yes [provider]  NIFEdipine (PROCARDIA XL/NIFEDICAL-XL) 90 MG 24 hr tablet Take 1 tablet (90 mg total) by mouth daily. 12/15/20 01/14/21 Yes Virl Axe, MD  predniSONE (DELTASONE) 5 MG tablet Take 5 mg by mouth daily. 01/15/18  Yes [provider]  TUMS 500 MG chewable tablet Chew 1 tablet by mouth 3 (three) times daily with meals. 11/02/20  Yes [provider]  tinidazole (TINDAMAX) 500 MG tablet Take 2 tablets (1,000 mg total) by mouth daily with breakfast. Patient not taking: Reported on 01/05/2021 12/11/20   Shelly Bombard, MD  amLODipine (NORVASC) 10 MG tablet Take 1 tablet (10 mg total) by mouth every evening. Patient not taking: No sig reported 09/13/15 12/14/20  [name], MD  labetalol (NORMODYNE) 200 MG tablet Take 1 tablet (200 mg total) by mouth 2 (two) times daily. Patient not taking: No sig reported 04/02/16 12/14/20  Elgergawy, Silver Huguenin, MD    I have reviewed the patient's current medications. Prior to Admission:  Medications Prior to Admission  Medication Sig Dispense Refill Last Dose   b complex-vitamin c-folic acid (NEPHRO-VITE) 0.8 MG TABS tablet Take 1 tablet by mouth daily.   01/05/2021 at am    calcitRIOL (ROCALTROL) 0.25 MCG capsule Take 0.25 mcg by mouth daily.   01/04/2021 at pm   carvedilol (COREG) 25 MG tablet Take 1 tablet (25 mg total) by mouth 2 (two) times daily with a meal. 60 tablet 0 01/05/2021 at 0500   Cholecalciferol (VITAMIN D3) 25 MCG (1000 UT) CAPS Take 1,000 Units by mouth in the morning.   01/05/2021 at am   Cyanocobalamin (VITAMIN B12) 1000 MCG TBCR Take 1,000 mcg by mouth daily.   01/05/2021   Drospirenone (SLYND) 4 MG TABS Take 1 tablet by mouth daily with breakfast. 28 tablet 11 01/04/2021   fluticasone (FLONASE) 50 MCG/ACT nasal spray Place 1 spray into both nostrils daily. (Patient taking differently: Place 1 spray into both nostrils daily as needed for allergies or rhinitis.) 16 g 2 unk   MAG-200 200 MG TABS Take 200 mg by mouth 2 (two) times daily.   01/05/2021 at am  mirtazapine (REMERON) 15 MG tablet Take 15 mg by mouth in the morning and at bedtime.   01/05/2021 at am   mycophenolate (MYFORTIC) 180 MG EC tablet Take 180 mg by mouth 2 (two) times daily.  11 01/05/2021 at am   NIFEdipine (PROCARDIA XL/NIFEDICAL-XL) 90 MG 24 hr tablet Take 1 tablet (90 mg total) by mouth daily. 30 tablet 0 01/05/2021   predniSONE (DELTASONE) 5 MG tablet Take 5 mg by mouth daily.  5 01/05/2021 at am   TUMS 500 MG chewable tablet Chew 1 tablet by mouth 3 (three) times daily with meals.   01/04/2021   tinidazole (TINDAMAX) 500 MG tablet Take 2 tablets (1,000 mg total) by mouth daily with breakfast. (Patient not taking: Reported on 01/05/2021) 10 tablet 2 Not Taking   Scheduled:  sodium chloride   Intravenous Once   calcitRIOL  0.25 mcg Oral Daily   calcium carbonate  1 tablet Oral TID WC   carvedilol  25 mg Oral BID WC   cholecalciferol  1,000 Units Oral q AM   magnesium oxide  200 mg Oral BID   mirtazapine  15 mg Oral QHS   multivitamin  1 tablet Oral QHS   mycophenolate  180 mg Oral QPM   mycophenolate  360 mg Oral q AM   NIFEdipine  90 mg Oral Daily   predniSONE  5 mg Oral Daily    vitamin B-12  1,000 mcg Oral Daily   Continuous:  ferric gluconate (FERRLECIT) IVPB 250 mg (01/06/21 1411)   PRN: Anti-infectives (From admission, onward)    None        Results for orders placed or performed during the hospital encounter of 01/05/21 (from the past 48 hour(s))  Type and screen [place]     Status: None (Preliminary result)   Collection Time: 01/05/21 12:15 PM  Result Value Ref Range   ABO/RH(D) O POS    Antibody Screen POS    Sample Expiration 01/08/2021,2359    Antibody Identification ANTI JKA (Kidd a)    DAT, IgG NEG    Unit Number [id number]    Blood Component Type RED CELLS,LR    Unit division 00    Status of Unit ISSUED,FINAL    Donor AG Type NEGATIVE FOR KIDD A ANTIGEN    Transfusion Status OK TO TRANSFUSE    Crossmatch Result COMPATIBLE    Unit Number [id number]    Blood Component Type RED CELLS,LR    Unit division 00    Status of Unit ISSUED,FINAL    Donor AG Type NEGATIVE FOR KIDD A ANTIGEN    Transfusion Status OK TO TRANSFUSE    Crossmatch Result COMPATIBLE    Unit Number [id number]    Blood Component Type RBC LR PHER1    Unit division 00    Status of Unit ALLOCATED    Transfusion Status OK TO TRANSFUSE    Crossmatch Result COMPATIBLE    Unit Number [id number]    Blood Component Type RBC LR PHER2    Unit division 00    Status of Unit ALLOCATED    Transfusion Status OK TO TRANSFUSE    Crossmatch Result COMPATIBLE   CBC with Differential     Status: Abnormal   Collection Time: 01/05/21 12:17 PM  Result Value Ref Range   WBC 11.4 (H) 4.0 - 10.5 K/uL   RBC 2.05 (L) 3.87 - 5.11 MIL/uL   Hemoglobin 4.9 (LL) 12.0 - 15.0 g/dL    Comment: This critical result  has verified and been called to B [facility] RN by Marijean Heath on 07 15 2022 at 1256, and has been read back.  REPEATED TO VERIFY CORRECTED ON 07/15 AT 1619: PREVIOUSLY REPORTED AS 4.9 This critical result has verified and been called to B MICHAELSON  RN by Marijean Heath on 07 15 2022 at 1256, and has been read back.     HCT 16.8 (L) 36.0 - 46.0 %   MCV 82.0 80.0 - 100.0 fL   MCH 23.9 (L) 26.0 - 34.0 pg   MCHC 29.2 (L) 30.0 - 36.0 g/dL   RDW 21.2 (H) 11.5 - 15.5 %   Platelets 334 150 - 400 K/uL   nRBC 0.0 0.0 - 0.2 %   Neutrophils Relative % 73 %   Neutro Abs 8.4 (H) 1.7 - 7.7 K/uL   Lymphocytes Relative 9 %   Lymphs Abs 1.1 0.7 - 4.0 K/uL   Monocytes Relative 6 %   Monocytes Absolute 0.7 0.1 - 1.0 K/uL   Eosinophils Relative 11 %   Eosinophils Absolute 1.2 (H) 0.0 - 0.5 K/uL   Basophils Relative 0 %   Basophils Absolute 0.0 0.0 - 0.1 K/uL   Immature Granulocytes 1 %   Abs Immature Granulocytes 0.06 0.00 - 0.07 K/uL    Comment: Performed at [facility] [address] [place], [place] [address]  Comprehensive metabolic panel     Status: Abnormal   Collection Time: 01/05/21 12:17 PM  Result Value Ref Range   Sodium 136 135 - 145 mmol/L   Potassium 2.9 (L) 3.5 - 5.1 mmol/L   Chloride 93 (L) 98 - 111 mmol/L   CO2 32 22 - 32 mmol/L   Glucose, Bld 142 (H) 70 - 99 mg/dL    Comment: Glucose reference range applies only to samples taken after fasting for at least 8 hours.   BUN 8 6 - 20 mg/dL   Creatinine, Ser 4.51 (H) 0.44 - 1.00 mg/dL   Calcium 8.2 (L) 8.9 - 10.3 mg/dL   Total Protein 7.7 6.5 - 8.1 g/dL   Albumin 2.2 (L) 3.5 - 5.0 g/dL   AST 20 15 - 41 U/L   ALT 10 0 - 44 U/L   Alkaline Phosphatase 105 38 - 126 U/L   Total Bilirubin 0.4 0.3 - 1.2 mg/dL   GFR, Estimated 12 (L) >60 mL/min    Comment: (NOTE) Calculated using the CKD-EPI Creatinine Equation (2021)    Anion gap 11 5 - 15    Comment: Performed at [facility], [place] [address] [place], [place] [address]  I-Stat beta hCG blood, ED     Status: None   Collection Time: 01/05/21 12:32 PM  Result Value Ref Range   I-stat hCG, quantitative <5.0 <5 mIU/mL   Comment 3            Comment:   GEST. AGE      CONC.  (mIU/mL)   <=1 WEEK        5 - 50     2  WEEKS       50 - 500     3 WEEKS       100 - 10,000     4 WEEKS     1,000 - 30,000        FEMALE AND NON-PREGNANT FEMALE:     LESS THAN 5 mIU/mL   I-stat chem 8, ED (not at [facility] or [facility])     Status: Abnormal  Collection Time: 01/05/21 12:34 PM  Result Value Ref Range   Sodium 138 135 - 145 mmol/L   Potassium 2.9 (L) 3.5 - 5.1 mmol/L   Chloride 94 (L) 98 - 111 mmol/L   BUN 7 6 - 20 mg/dL   Creatinine, Ser 4.70 (H) 0.44 - 1.00 mg/dL   Glucose, Bld 143 (H) 70 - 99 mg/dL    Comment: Glucose reference range applies only to samples taken after fasting for at least 8 hours.   Calcium, Ion 0.96 (L) 1.15 - 1.40 mmol/L   TCO2 34 (H) 22 - 32 mmol/L   Hemoglobin 6.1 (LL) 12.0 - 15.0 g/dL   HCT 18.0 (L) 36.0 - 46.0 %   Comment NOTIFIED PHYSICIAN   SARS CORONAVIRUS 2 (TAT 6-24 HRS) Nasopharyngeal Nasopharyngeal Swab     Status: None   Collection Time: 01/05/21  2:11 PM   Specimen: Nasopharyngeal Swab  Result Value Ref Range   SARS Coronavirus 2 NEGATIVE NEGATIVE    Comment: (NOTE) SARS-CoV-2 target nucleic acids are NOT DETECTED.  The SARS-CoV-2 RNA is generally detectable in upper and lower respiratory specimens during the acute phase of infection. Negative results do not preclude SARS-CoV-2 infection, do not rule out co-infections with other pathogens, and should not be used as the sole basis for treatment or other patient management decisions. Negative results must be combined with clinical observations, patient history, and epidemiological information. The expected result is Negative.  Fact Sheet for Patients: SugarRoll.be  Fact Sheet for Healthcare Providers: https://www.woods-mathews.com/  This test is not yet approved or cleared by the [place] FDA and  has been authorized for detection and/or diagnosis of SARS-CoV-2 by FDA under an Emergency Use Authorization (EUA). This EUA will remain  in effect (meaning this test can be used) for  the duration of the COVID-19 declaration under Se ction 564(b)(1) of the Act, 21 U.S.C. section 360bbb-3(b)(1), unless the authorization is terminated or revoked sooner.  Performed at [facility], [place] [address], [place], [place] [address]   Prepare RBC (crossmatch)     Status: None   Collection Time: 01/05/21  2:13 PM  Result Value Ref Range   Order Confirmation      ORDER PROCESSED BY BLOOD BANK Performed at [facility], [place] [address] [place], [place] [address]   Prepare RBC (crossmatch)     Status: None   Collection Time: 01/05/21  6:12 PM  Result Value Ref Range   Order Confirmation      ORDER PROCESSED BY BLOOD BANK Performed at [facility], [place] [address] [place], [place] [address]   Hemoglobin and hematocrit, blood     Status: Abnormal   Collection Time: 01/06/21  4:52 AM  Result Value Ref Range   Hemoglobin 5.7 (LL) 12.0 - 15.0 g/dL    Comment: CRITICAL VALUE NOTED.  VALUE IS CONSISTENT WITH PREVIOUSLY REPORTED AND CALLED VALUE. REPEATED TO VERIFY POST TRANSFUSION SPECIMEN    HCT 18.5 (L) 36.0 - 46.0 %    Comment: Performed at [facility], [place] [address], [place], [place] [address]  Hemoglobin and hematocrit, blood     Status: Abnormal   Collection Time: 01/06/21  7:51 AM  Result Value Ref Range   Hemoglobin 5.8 (LL) 12.0 - 15.0 g/dL    Comment: CRITICAL VALUE NOTED.  VALUE IS CONSISTENT WITH PREVIOUSLY REPORTED AND CALLED VALUE. REPEATED TO VERIFY    HCT 19.3 (L) 36.0 - 46.0 %    Comment:  Performed at [facility], [place] [address], [place], [place] [address]  Prepare RBC (crossmatch)     Status: None   Collection Time: 01/06/21  7:51 AM  Result Value Ref Range   Order Confirmation      ORDER PROCESSED BY BLOOD BANK Performed at [facility], [place] [address] [place], [place] [id number]   Basic metabolic panel     Status: Abnormal   Collection Time: 01/06/21  7:51 AM  Result Value Ref Range   Sodium 136 135 - 145  mmol/L   Potassium 3.1 (L) 3.5 - 5.1 mmol/L   Chloride 95 (L) 98 - 111 mmol/L   CO2 30 22 - 32 mmol/L   Glucose, Bld 90 70 - 99 mg/dL    Comment: Glucose reference range applies only to samples taken after fasting for at least 8 hours.   BUN 13 6 - 20 mg/dL   Creatinine, Ser 7.12 (H) 0.44 - 1.00 mg/dL    Comment: DELTA CHECK NOTED   Calcium 8.4 (L) 8.9 - 10.3 mg/dL   GFR, Estimated 7 (L) >60 mL/min    Comment: (NOTE) Calculated using the CKD-EPI Creatinine Equation (2021)    Anion gap 11 5 - 15    Comment: Performed at [facility] [address], [place], [place] [address]  Hemoglobin and hematocrit, blood     Status: Abnormal   Collection Time: 01/06/21  4:39 PM  Result Value Ref Range   Hemoglobin 8.0 (L) 12.0 - 15.0 g/dL    Comment: REPEATED TO VERIFY POST TRANSFUSION SPECIMEN    HCT 25.5 (L) 36.0 - 46.0 %    Comment: Performed at [facility] [address], [place], [place] [address]   ROS: See HPI  Physical Exam: Vitals:   01/07/21 0436 01/07/21 0800  BP: 136/77 126/87  Pulse: 88 92  Resp: 16 16  Temp: 98.9 F (37.2 C) 98.7 F (37.1 C)  SpO2: 90% 95%     General: Alert, young female WDWN, NAD, appropriate Neck: Supple, no JVD Heart: RRR, no MRG Lungs: CTA, nonlabored breathing Abdomen: Bowel sounds normoactive, soft nontender ND, no mass appreciated Extremities: No pedal edema Skin: Warm dry no overt pedal ulcers or rash appreciated Neuro: Alert O x3 moves all extremities independently no acute focal deficits appreciated Dialysis Access: Positive bruit left FA AV fistula  OP HD orders= [name], MWF, 3 hours 45 minutes, EDW 62.5 kg 2K, 3.0 calcium bath UFP2 Left FA AV fistula Heparin 3000 units Hectorol 5 mics q. dialysis Mircera 225 MCG every 2 wks last given 7/04  Assessment/Plan Symptomatic anemia to multiple etiologies including blood loss, anemia of ESRD, anemia of iron deficiency anemia, anemia associated with her  transplant meds status post transfusion 2 units packed red blood cells on IV iron currently with today's Hgb pending, 8.0 yesterday ESRD -HD MWF, K2.9 >3.1 on p.o. supplements per admit team ,use high K bath on dialysis, plan for HD tomorrow if here otherwise outpatient, follow-up outpatient K levels, I will notify her outpatient center if discharged today Hypertension/volume  -no excess volume on exam BP stable, on BP meds Procardia XL 90 mg daily, Coreg 25 mg twice daily Status post failed kidney transplants x2= return to HD May 2022, on prednisone and Myfortic Metabolic bone disease -IV Hectorol on dialysis,, also daily p.o. calcitriol, corrected calcium 9.8 follow-up calcium phosphorus trend use binder Tums watch calcium levels closely, last PTH level 1325 =12/06/20  History of hypomagnesia= on p.o. supplement last outpatient level 1.6 Nutrition -albumin 2.2 continue renal failure diet add Nepro for protein supplement  Ernest Haber, PA-C [facility] [phone] 01/07/2021, 10:16 AM

## 2021-01-07 NOTE — Discharge Summary (Signed)
Name: Kerry Cook MRN: [id number] DOB: 1985-11-12 35 y.o. PCP: Patient, No Pcp Per (Inactive)  Date of Admission: 01/05/2021 11:41 AM Date of Discharge: 01/07/2021 Attending Physician: Sid Falcon, MD  Discharge Diagnosis: 1. Symptomatic Anemia due to heavy menstrual blood loss  Discharge Medications: Allergies as of 01/07/2021       Reactions   Tape Itching, Other (See Comments)   Plastic/clear tape tears up the patient's skin   Zosyn [piperacillin Sod-tazobactam So] Itching   Has patient had a PCN reaction causing immediate rash, facial/tongue/throat swelling, SOB or lightheadedness with hypotension: No Has patient had a PCN reaction causing severe rash involving mucus membranes or skin necrosis: No Has patient had a PCN reaction that required hospitalization: No Has patient had a PCN reaction occurring within the last 10 years: No If all of the above answers are "NO", then may proceed with Cephalosporin use.        Medication List     TAKE these medications    b complex-vitamin c-folic acid 0.8 MG Tabs tablet Take 1 tablet by mouth daily.   calcitRIOL 0.25 MCG capsule Commonly known as: ROCALTROL Take 0.25 mcg by mouth daily.   carvedilol 25 MG tablet Commonly known as: COREG Take 1 tablet (25 mg total) by mouth 2 (two) times daily with a meal.   fluticasone 50 MCG/ACT nasal spray Commonly known as: FLONASE Place 1 spray into both nostrils daily as needed for allergies or rhinitis.   Mag-200 200 MG Tabs Generic drug: Magnesium Oxide Take 200 mg by mouth 2 (two) times daily.   mirtazapine 15 MG tablet Commonly known as: REMERON Take 15 mg by mouth in the morning and at bedtime.   mycophenolate 180 MG EC tablet Commonly known as: MYFORTIC Take 180 mg by mouth 2 (two) times daily.   NIFEdipine 90 MG 24 hr tablet Commonly known as: PROCARDIA XL/NIFEDICAL-XL Take 1 tablet (90 mg total) by mouth daily.   predniSONE 5 MG tablet Commonly known as:  DELTASONE Take 5 mg by mouth daily.   Slynd 4 MG Tabs Generic drug: Drospirenone Take 1 tablet by mouth daily with breakfast.   Tums 500 MG chewable tablet Generic drug: calcium carbonate Chew 1 tablet by mouth 3 (three) times daily with meals.   Vitamin B12 1000 MCG Tbcr Take 1,000 mcg by mouth daily.   Vitamin D3 25 MCG (1000 UT) Caps Take 1,000 Units by mouth in the morning.       ASK your doctor about these medications    tinidazole 500 MG tablet Commonly known as: Tindamax Take 2 tablets (1,000 mg total) by mouth daily with breakfast.        Disposition and follow-up:   [name] was discharged from [facility] in Stable condition.  At the hospital follow up visit please address:  1.  Symptomatic anemia-  given 3 units of blood and IV iron x2. Please repeat H&H to ensure Hgb stable.    2.  Labs / imaging needed at time of follow-up: CBC  3.  Pending labs/ test needing follow-up: none  Follow-up Appointments:  Follow-up Information     [facility] Follow up.   Contact information: [address] [place] [id number]               Follow up with OB/GYN for management of  heavy menstrual bleeding    Hospital Course by problem list: 1. Kerry Cook is 35 y.o. 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 Patient presented from from dialysis center with a week long shortness of breath, fatigue, difficulty concentrating and tachycardia, though was able to tolerate full session of dialysis. Has had multiple admissions for the same problem, most recent  admission was on 11/2020. Noted to have hemoglobin of 4.9 on admission. Systolic murmur noted on exam likely a flow murmur in setting of anemia. Her anemia is in context of recent heavy menstrual flow of six days duration. Has history of fibroids and has tried multiple contraceptive pills in the past and recently started taking slynd.  Her baseline Hgb in 2021 was 10.5 but has been consistently averaged 7.5 in 2022. Etiology of her anemia is multifactorial, a combination of anemia of chronic disease and low EPO production in setting of ESRD and heavy blood loss from heavy menstrual flow.  Anemia responded well to two units of blood. She was given ferric gluconate 250 mg IV x2 per nephrology recommendation. She has been receiving full dose erythropoietin (ESA) with dialysis.   Hgb was 6.8 morning of day of discharge.  Another unit of PRBC was ordered prior to discharge.  Posttransfusion H&H will be pending.  Patient told to follow up outpatient in one week to repeat blood count to ensure they are stable.  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 She follows with nephrology and transplant in [facility]. It appears patient is not taking medication appropriately and has had inconsistent follow up. On medication reconciliation patient has been taking myfortic 180 mg BID . Pharmacy confirmed with [facility], patient should be taking myfortic '360mg'$  every morning and 180 mg every evening. Prednisone '5mg'$  daily so was put on the right dose during admission.  HTN On coreg '25mg'$  BID and nifedipine '90mg'$  daily at home. Was continued on home BP medications.   GERD -continued on home protonix '20mg'$  daily   Discharge vitals:   BP 132/88   Pulse 88   Temp 98.5 F (36.9 C) (Oral)   Resp 16   Ht 5' (1.524 m)   Wt 63.5 kg   LMP  12/30/2020   SpO2 96%   BMI 27.34 kg/m  Discharge exam:   General: well-appearing female using her phone laying  with wearing supplemental oxygen, in no acute distress. HEENT: Normocephalic. Atraumatic. Pale under eyelids CV: RRR. RRR, 2/3 systolic murmur heard on admission not appreciated today. No rubs, or gallops. No LE edema Pulmonary: Lungs CTAB. Normal effort. No wheezing or rales. Abdominal: Soft, nontender, nondistended. Normal bowel sounds. Extremities: Palpable radial and DP pulses. Normal ROM. Skin: Warm and dry. No obvious rash or lesions. Neuro: A&Ox3. Moves all extremities. Normal sensation. No focal deficit. Psych: Normal mood and affect  Pertinent Labs, Studies, and Procedures:  . CBC Latest Ref Rng & Units 01/07/2021 01/06/2021 01/06/2021  WBC 4.0 - 10.5 K/uL - - -  Hemoglobin 12.0 - 15.0 g/dL 6.8(LL) 8.0(L) 5.8(LL)  Hematocrit 36.0 - 46.0 % 22.2(L) 25.5(L) 19.3(L)  Platelets 150 - 400 K/uL - - -  Discharge Instructions: Discharge Instructions     Call MD for:  difficulty breathing, headache or visual disturbances   Complete by: As directed    Call MD for:  extreme fatigue   Complete by: As directed    Call MD for:  persistant dizziness or light-headedness   Complete by: As directed    Diet - low sodium heart healthy   Complete by: As directed    Increase activity slowly   Complete by: As directed        Signed: Soriya Worster N, DO 01/07/2021, 2:31 PM

## 2021-01-08 ENCOUNTER — Telehealth (HOSPITAL_COMMUNITY): Payer: Self-pay | Admitting: Nephrology

## 2021-01-08 LAB — BPAM RBC
Blood Product Expiration Date: 202208202359
Blood Product Expiration Date: 202208222359
Blood Product Expiration Date: 202208242359
Blood Product Expiration Date: 202208242359
ISSUE DATE / TIME: 202207151539
ISSUE DATE / TIME: 202207160835
ISSUE DATE / TIME: 202207171144
Unit Type and Rh: 5100
Unit Type and Rh: 5100
Unit Type and Rh: 5100
Unit Type and Rh: 5100

## 2021-01-08 LAB — TYPE AND SCREEN
ABO/RH(D): O POS
Antibody Screen: POSITIVE
DAT, IgG: NEGATIVE
Donor AG Type: NEGATIVE
Donor AG Type: NEGATIVE
Donor AG Type: NEGATIVE
Unit division: 0
Unit division: 0
Unit division: 0
Unit division: 0

## 2021-01-08 NOTE — Telephone Encounter (Signed)
Transition of care contact from inpatient facility  Date of Discharge: 7/17 Date of Contact: 01/08/2021 - attempted Method of contact: Phone  Attempted to contact patient to discuss transition of care from inpatient admission. Patient did not answer the phone. Message was left on the patient's voicemail with call back number [phone].

## 2021-01-09 ENCOUNTER — Telehealth: Payer: Self-pay | Admitting: Internal Medicine

## 2021-01-09 NOTE — Telephone Encounter (Signed)
-----   Message from Mike Craze, DO sent at 01/07/2021 10:30 AM EDT ----- Can we schedule for a hospital follow up this week? Patient would also like to establish with [place] thanks!

## 2021-01-09 NOTE — Telephone Encounter (Signed)
TOC HFU appointment  01/17/2021 at 10:45 am with Dr. Marlou Sa.  Unable to get in contact with patient via telephone.  Appointment letter has been mailed.

## 2021-01-11 NOTE — Telephone Encounter (Signed)
Transition Care Management Follow-up Telephone Call Date of discharge and from where: Discharged 01/07/21 from the hospital. How have you been since you were released from the hospital? Stated she "feels fine". Stated she received a blood transfusion prior to discharge. Any questions or concerns? No  Items Reviewed: Did the pt receive and understand the discharge instructions provided? Yes  Medications obtained and verified? Yes  Any new allergies since your discharge? No  Dietary orders reviewed? Stated no special diet. Do you have support at home? Yes   Home Care and Equipment/Supplies: Were home health services ordered? no   Functional Questionnaire: (I = Independent and D = Dependent) ADLs: I  Bathing/Dressing- I  Meal Prep- I  Eating- I  Maintaining continence- I  Transferring/Ambulation- I  Managing Meds- I   Follow up appointments reviewed:  PCP Hospital f/u appt confirmed? Yes  Scheduled to see Dr Marlou Sa on 01/17/21 @ 1045 AM.. [facility] f/u appt confirmed? N/A. Are transportation arrangements needed? No  If their condition worsens, is the pt aware to call PCP or go to the Emergency Dept.? Yes Was the patient provided with contact information for the PCP's office or ED? Yes Was to pt encouraged to call back with questions or concerns? Yes

## 2021-01-17 ENCOUNTER — Encounter: Payer: Medicare Other | Admitting: Internal Medicine

## 2021-01-18 ENCOUNTER — Non-Acute Institutional Stay (HOSPITAL_COMMUNITY)
Admission: RE | Admit: 2021-01-18 | Discharge: 2021-01-18 | Disposition: A | Discharge status: Home / Self Care | Payer: Medicare Other | Source: Ambulatory Visit | Attending: Internal Medicine | Admitting: Internal Medicine

## 2021-01-18 ENCOUNTER — Other Ambulatory Visit: Payer: Self-pay

## 2021-01-18 DIAGNOSIS — D649 Anemia, unspecified: Secondary | ICD-10-CM | POA: Insufficient documentation

## 2021-01-18 LAB — PREPARE RBC (CROSSMATCH)

## 2021-01-18 MED ORDER — SODIUM CHLORIDE 0.9 % IV SOLN: INTRAVENOUS | Status: DC | PRN

## 2021-01-18 MED ORDER — SODIUM CHLORIDE 0.9% IV SOLUTION: Freq: Once | INTRAVENOUS | Status: AC

## 2021-01-18 NOTE — Progress Notes (Signed)
Patient received via PIV 1 unit of packed red blood cells as ordered by Dr. Corliss Parish. Type and screen was done before transfusion.Pt has hx of antibodies. Pt consented for blood products on 11/28/20. No premedications required per orders.  Pre transfusion Hgb 6.1 from OSH labwork. Tolerated well, vitals stable, discharge instructions given, verbalized understanding. Patient alert, oriented and ambulatory at the time of discharge.

## 2021-01-19 LAB — TYPE AND SCREEN
ABO/RH(D): O POS
Antibody Screen: NEGATIVE
Donor AG Type: NEGATIVE
Unit division: 0

## 2021-01-19 LAB — BPAM RBC
Blood Product Expiration Date: 202208242359
ISSUE DATE / TIME: 202207281225
Unit Type and Rh: 5100

## 2021-03-09 ENCOUNTER — Telehealth: Payer: Self-pay | Admitting: Obstetrics

## 2021-03-09 ENCOUNTER — Encounter: Payer: Self-pay | Admitting: *Deleted

## 2021-03-09 MED ORDER — FLUCONAZOLE 150 MG PO TABS: 150.0000 mg | ORAL_TABLET | Freq: Once | ORAL | 0 refills | Status: AC

## 2021-03-09 NOTE — Telephone Encounter (Signed)
Patient called stating she has a yeast infection and is requesting Rx.  Diflucan routed per protocol.

## 2021-05-20 ENCOUNTER — Encounter (HOSPITAL_COMMUNITY): Payer: Self-pay

## 2021-05-20 ENCOUNTER — Emergency Department (HOSPITAL_COMMUNITY): Payer: Medicare Other

## 2021-05-20 ENCOUNTER — Other Ambulatory Visit: Payer: Self-pay

## 2021-05-20 ENCOUNTER — Inpatient Hospital Stay (HOSPITAL_COMMUNITY)
Admission: EM | Admit: 2021-05-20 | Discharge: 2021-05-24 | DRG: 640 | Disposition: A | Discharge status: Home / Self Care | Payer: Medicare Other | Attending: Internal Medicine | Admitting: Internal Medicine

## 2021-05-20 DIAGNOSIS — Y830 Surgical operation with transplant of whole organ as the cause of abnormal reaction of the patient, or of later complication, without mention of misadventure at the time of the procedure: Secondary | ICD-10-CM | POA: Diagnosis present

## 2021-05-20 DIAGNOSIS — E871 Hypo-osmolality and hyponatremia: Secondary | ICD-10-CM | POA: Diagnosis not present

## 2021-05-20 DIAGNOSIS — E875 Hyperkalemia: Secondary | ICD-10-CM | POA: Diagnosis present

## 2021-05-20 DIAGNOSIS — I120 Hypertensive chronic kidney disease with stage 5 chronic kidney disease or end stage renal disease: Secondary | ICD-10-CM | POA: Diagnosis not present

## 2021-05-20 DIAGNOSIS — I161 Hypertensive emergency: Secondary | ICD-10-CM | POA: Diagnosis not present

## 2021-05-20 DIAGNOSIS — Z841 Family history of disorders of kidney and ureter: Secondary | ICD-10-CM

## 2021-05-20 DIAGNOSIS — Z09 Encounter for follow-up examination after completed treatment for conditions other than malignant neoplasm: Secondary | ICD-10-CM

## 2021-05-20 DIAGNOSIS — Z940 Kidney transplant status: Secondary | ICD-10-CM | POA: Diagnosis not present

## 2021-05-20 DIAGNOSIS — J9601 Acute respiratory failure with hypoxia: Secondary | ICD-10-CM | POA: Diagnosis not present

## 2021-05-20 DIAGNOSIS — R9431 Abnormal electrocardiogram [ECG] [EKG]: Secondary | ICD-10-CM | POA: Diagnosis present

## 2021-05-20 DIAGNOSIS — Z7952 Long term (current) use of systemic steroids: Secondary | ICD-10-CM | POA: Diagnosis not present

## 2021-05-20 DIAGNOSIS — Z91048 Other nonmedicinal substance allergy status: Secondary | ICD-10-CM | POA: Diagnosis not present

## 2021-05-20 DIAGNOSIS — Z20822 Contact with and (suspected) exposure to covid-19: Secondary | ICD-10-CM | POA: Diagnosis present

## 2021-05-20 DIAGNOSIS — E162 Hypoglycemia, unspecified: Secondary | ICD-10-CM | POA: Diagnosis present

## 2021-05-20 DIAGNOSIS — Z8701 Personal history of pneumonia (recurrent): Secondary | ICD-10-CM

## 2021-05-20 DIAGNOSIS — Z9115 Patient's noncompliance with renal dialysis: Secondary | ICD-10-CM | POA: Diagnosis not present

## 2021-05-20 DIAGNOSIS — Z23 Encounter for immunization: Secondary | ICD-10-CM | POA: Diagnosis not present

## 2021-05-20 DIAGNOSIS — Z881 Allergy status to other antibiotic agents status: Secondary | ICD-10-CM | POA: Diagnosis not present

## 2021-05-20 DIAGNOSIS — Z8711 Personal history of peptic ulcer disease: Secondary | ICD-10-CM

## 2021-05-20 DIAGNOSIS — E8770 Fluid overload, unspecified: Principal | ICD-10-CM | POA: Diagnosis present

## 2021-05-20 DIAGNOSIS — D631 Anemia in chronic kidney disease: Secondary | ICD-10-CM | POA: Diagnosis not present

## 2021-05-20 DIAGNOSIS — J811 Chronic pulmonary edema: Secondary | ICD-10-CM

## 2021-05-20 DIAGNOSIS — M898X9 Other specified disorders of bone, unspecified site: Secondary | ICD-10-CM | POA: Diagnosis present

## 2021-05-20 DIAGNOSIS — Z79899 Other long term (current) drug therapy: Secondary | ICD-10-CM | POA: Diagnosis not present

## 2021-05-20 DIAGNOSIS — N186 End stage renal disease: Secondary | ICD-10-CM | POA: Diagnosis not present

## 2021-05-20 DIAGNOSIS — T8612 Kidney transplant failure: Secondary | ICD-10-CM | POA: Diagnosis present

## 2021-05-20 DIAGNOSIS — Z992 Dependence on renal dialysis: Secondary | ICD-10-CM | POA: Diagnosis not present

## 2021-05-20 HISTORY — DX: End stage renal disease: Z99.2

## 2021-05-20 HISTORY — DX: Dependence on renal dialysis: N18.6

## 2021-05-20 LAB — CBC WITH DIFFERENTIAL/PLATELET
Abs Immature Granulocytes: 0.03 10*3/uL (ref 0.00–0.07)
Basophils Absolute: 0 10*3/uL (ref 0.0–0.1)
Basophils Relative: 0 %
Eosinophils Absolute: 0.2 10*3/uL (ref 0.0–0.5)
Eosinophils Relative: 2 %
HCT: 32.1 % — ABNORMAL LOW (ref 36.0–46.0)
Hemoglobin: 10.3 g/dL — ABNORMAL LOW (ref 12.0–15.0)
Immature Granulocytes: 0 %
Lymphocytes Relative: 16 %
Lymphs Abs: 1.4 10*3/uL (ref 0.7–4.0)
MCH: 29.8 pg (ref 26.0–34.0)
MCHC: 32.1 g/dL (ref 30.0–36.0)
MCV: 92.8 fL (ref 80.0–100.0)
Monocytes Absolute: 0.6 10*3/uL (ref 0.1–1.0)
Monocytes Relative: 7 %
Neutro Abs: 6.4 10*3/uL (ref 1.7–7.7)
Neutrophils Relative %: 75 %
Platelets: 158 10*3/uL (ref 150–400)
RBC: 3.46 MIL/uL — ABNORMAL LOW (ref 3.87–5.11)
RDW: 21.4 % — ABNORMAL HIGH (ref 11.5–15.5)
WBC: 8.7 10*3/uL (ref 4.0–10.5)
nRBC: 0.3 % — ABNORMAL HIGH (ref 0.0–0.2)

## 2021-05-20 LAB — COMPREHENSIVE METABOLIC PANEL
ALT: 11 U/L (ref 0–44)
AST: 17 U/L (ref 15–41)
Albumin: 3.3 g/dL — ABNORMAL LOW (ref 3.5–5.0)
Alkaline Phosphatase: 71 U/L (ref 38–126)
Anion gap: 16 — ABNORMAL HIGH (ref 5–15)
BUN: 100 mg/dL — ABNORMAL HIGH (ref 6–20)
CO2: 17 mmol/L — ABNORMAL LOW (ref 22–32)
Calcium: 7.7 mg/dL — ABNORMAL LOW (ref 8.9–10.3)
Chloride: 100 mmol/L (ref 98–111)
Creatinine, Ser: 20.36 mg/dL — ABNORMAL HIGH (ref 0.44–1.00)
GFR, Estimated: 2 mL/min — ABNORMAL LOW (ref >60)
Glucose, Bld: 58 mg/dL — ABNORMAL LOW (ref 70–99)
Potassium: >7.5 mmol/L (ref 3.5–5.1)
Sodium: 133 mmol/L — ABNORMAL LOW (ref 135–145)
Total Bilirubin: 0.7 mg/dL (ref 0.3–1.2)
Total Protein: 7.4 g/dL (ref 6.5–8.1)

## 2021-05-20 LAB — I-STAT CHEM 8, ED
BUN: 103 mg/dL — ABNORMAL HIGH (ref 6–20)
Calcium, Ion: 0.85 mmol/L — CL (ref 1.15–1.40)
Chloride: 106 mmol/L (ref 98–111)
Creatinine, Ser: >18 mg/dL — ABNORMAL HIGH (ref 0.44–1.00)
Glucose, Bld: 51 mg/dL — ABNORMAL LOW (ref 70–99)
HCT: 31 % — ABNORMAL LOW (ref 36.0–46.0)
Hemoglobin: 10.5 g/dL — ABNORMAL LOW (ref 12.0–15.0)
Potassium: 7 mmol/L (ref 3.5–5.1)
Sodium: 132 mmol/L — ABNORMAL LOW (ref 135–145)
TCO2: 18 mmol/L — ABNORMAL LOW (ref 22–32)

## 2021-05-20 LAB — TROPONIN I (HIGH SENSITIVITY): Troponin I (High Sensitivity): 42 ng/L — ABNORMAL HIGH (ref <18)

## 2021-05-20 LAB — I-STAT ARTERIAL BLOOD GAS, ED
Acid-base deficit: 6 mmol/L — ABNORMAL HIGH (ref 0.0–2.0)
Bicarbonate: 17.6 mmol/L — ABNORMAL LOW (ref 20.0–28.0)
Calcium, Ion: 0.97 mmol/L — ABNORMAL LOW (ref 1.15–1.40)
HCT: 30 % — ABNORMAL LOW (ref 36.0–46.0)
Hemoglobin: 10.2 g/dL — ABNORMAL LOW (ref 12.0–15.0)
O2 Saturation: 99 %
Patient temperature: 98.1
Potassium: 7.4 mmol/L (ref 3.5–5.1)
Sodium: 132 mmol/L — ABNORMAL LOW (ref 135–145)
TCO2: 18 mmol/L — ABNORMAL LOW (ref 22–32)
pCO2 arterial: 26.7 mmHg — ABNORMAL LOW (ref 32.0–48.0)
pH, Arterial: 7.426 (ref 7.350–7.450)
pO2, Arterial: 119 mmHg — ABNORMAL HIGH (ref 83.0–108.0)

## 2021-05-20 LAB — I-STAT BETA HCG BLOOD, ED (MC, WL, AP ONLY): I-stat hCG, quantitative: 6.3 m[IU]/mL — ABNORMAL HIGH (ref <5)

## 2021-05-20 LAB — CBG MONITORING, ED
Glucose-Capillary: 226 mg/dL — ABNORMAL HIGH (ref 70–99)
Glucose-Capillary: 50 mg/dL — ABNORMAL LOW (ref 70–99)
Glucose-Capillary: 91 mg/dL (ref 70–99)

## 2021-05-20 LAB — BRAIN NATRIURETIC PEPTIDE: B Natriuretic Peptide: 2078.2 pg/mL — ABNORMAL HIGH (ref 0.0–100.0)

## 2021-05-20 MED ORDER — CHLORHEXIDINE GLUCONATE CLOTH 2 % EX PADS
6.0000 | MEDICATED_PAD | Freq: Every day | CUTANEOUS | Status: DC
  Administered 2021-05-21 – 2021-05-22 (×4): 6 via TOPICAL

## 2021-05-20 MED ORDER — NITROGLYCERIN IN D5W 200-5 MCG/ML-% IV SOLN
0.0000 ug/min | INTRAVENOUS | Status: DC
  Administered 2021-05-20: 22:00:00 5 ug/min via INTRAVENOUS
  Filled 2021-05-20: qty 250

## 2021-05-20 MED ORDER — PENTAFLUOROPROP-TETRAFLUOROETH EX AERO
1.0000 "application " | INHALATION_SPRAY | CUTANEOUS | Status: DC | PRN
  Filled 2021-05-20: qty 116

## 2021-05-20 MED ORDER — INSULIN ASPART 100 UNIT/ML IV SOLN
5.0000 [IU] | Freq: Once | INTRAVENOUS | Status: AC
  Administered 2021-05-20: 22:00:00 5 [IU] via INTRAVENOUS

## 2021-05-20 MED ORDER — LIDOCAINE-PRILOCAINE 2.5-2.5 % EX CREA
1.0000 "application " | TOPICAL_CREAM | CUTANEOUS | Status: DC | PRN
  Filled 2021-05-20: qty 5

## 2021-05-20 MED ORDER — DOCUSATE SODIUM 100 MG PO CAPS: 100.0000 mg | ORAL_CAPSULE | Freq: Two times a day (BID) | ORAL | Status: DC | PRN

## 2021-05-20 MED ORDER — SODIUM ZIRCONIUM CYCLOSILICATE 10 G PO PACK
10.0000 g | PACK | Freq: Once | ORAL | Status: AC
  Administered 2021-05-20: 23:00:00 10 g via ORAL
  Filled 2021-05-20: qty 1

## 2021-05-20 MED ORDER — CALCIUM GLUCONATE-NACL 1-0.675 GM/50ML-% IV SOLN
1.0000 g | Freq: Once | INTRAVENOUS | Status: AC
  Administered 2021-05-20: 23:00:00 1000 mg via INTRAVENOUS
  Filled 2021-05-20: qty 50

## 2021-05-20 MED ORDER — HEPARIN SODIUM (PORCINE) 5000 UNIT/ML IJ SOLN
5000.0000 [IU] | Freq: Three times a day (TID) | INTRAMUSCULAR | Status: DC
  Administered 2021-05-21 – 2021-05-23 (×6): 5000 [IU] via SUBCUTANEOUS
  Filled 2021-05-20 (×10): qty 1

## 2021-05-20 MED ORDER — HEPARIN SODIUM (PORCINE) 1000 UNIT/ML DIALYSIS
1000.0000 [IU] | INTRAMUSCULAR | Status: DC | PRN
  Filled 2021-05-20: qty 1

## 2021-05-20 MED ORDER — POLYETHYLENE GLYCOL 3350 17 G PO PACK: 17.0000 g | PACK | Freq: Every day | ORAL | Status: DC | PRN

## 2021-05-20 MED ORDER — NIFEDIPINE ER OSMOTIC RELEASE 90 MG PO TB24
90.0000 mg | ORAL_TABLET | Freq: Every day | ORAL | Status: DC
  Administered 2021-05-21 – 2021-05-24 (×4): 90 mg via ORAL
  Filled 2021-05-20 (×4): qty 1

## 2021-05-20 MED ORDER — PANTOPRAZOLE SODIUM 40 MG IV SOLR
40.0000 mg | Freq: Every day | INTRAVENOUS | Status: DC
  Administered 2021-05-21: 22:00:00 40 mg via INTRAVENOUS
  Filled 2021-05-20 (×2): qty 40

## 2021-05-20 MED ORDER — DEXTROSE 50 % IV SOLN
50.0000 mL | Freq: Once | INTRAVENOUS | Status: AC
  Administered 2021-05-20: 23:00:00 50 mL via INTRAVENOUS
  Filled 2021-05-20: qty 50

## 2021-05-20 MED ORDER — LIDOCAINE HCL (PF) 1 % IJ SOLN: 5.0000 mL | INTRAMUSCULAR | Status: DC | PRN

## 2021-05-20 MED ORDER — ALBUTEROL SULFATE (2.5 MG/3ML) 0.083% IN NEBU
10.0000 mg | INHALATION_SOLUTION | Freq: Once | RESPIRATORY_TRACT | Status: AC
  Administered 2021-05-20: 20:00:00 10 mg via RESPIRATORY_TRACT
  Filled 2021-05-20: qty 12

## 2021-05-20 MED ORDER — DEXTROSE 50 % IV SOLN
1.0000 | Freq: Once | INTRAVENOUS | Status: AC
  Administered 2021-05-20: 22:00:00 50 mL via INTRAVENOUS
  Filled 2021-05-20: qty 50

## 2021-05-20 MED ORDER — ACETAMINOPHEN 325 MG PO TABS
650.0000 mg | ORAL_TABLET | Freq: Four times a day (QID) | ORAL | Status: DC | PRN
  Administered 2021-05-20 – 2021-05-22 (×4): 650 mg via ORAL
  Filled 2021-05-20 (×4): qty 2

## 2021-05-20 MED ORDER — ALTEPLASE 2 MG IJ SOLR
2.0000 mg | Freq: Once | INTRAMUSCULAR | Status: DC | PRN
  Filled 2021-05-20: qty 2

## 2021-05-20 MED ORDER — SODIUM CHLORIDE 0.9 % IV SOLN: 100.0000 mL | INTRAVENOUS | Status: DC | PRN

## 2021-05-20 MED ORDER — CARVEDILOL 25 MG PO TABS
25.0000 mg | ORAL_TABLET | Freq: Two times a day (BID) | ORAL | Status: DC
  Administered 2021-05-21 – 2021-05-22 (×3): 25 mg via ORAL
  Filled 2021-05-20 (×3): qty 1

## 2021-05-20 NOTE — ED Notes (Signed)
RT recommended switching pt to nasal canula. Pt currently on 4L nasal canual at 96%

## 2021-05-20 NOTE — ED Provider Notes (Signed)
[facility] EMERGENCY DEPARTMENT Provider Note   CSN: [id number] Arrival date & time: 05/20/21  1855     History Chief Complaint  Patient presents with   Respiratory Distress    Kerry Cook is a 35 y.o. female.  HPI Patient is a 35 year old female with a history of CKD on HD Monday/Wednesday/Friday, hypertension, gastric ulcers, who presents to the emergency department due to respiratory distress.  Per EMS, patient recently started on dialysis and slept in on Friday and accidentally missed her dialysis session.  This is the first time she ever missed dialysis.  Yesterday she began experiencing a cough, weakness, fatigue, shortness of breath, diarrhea.  Today her shortness of breath worsened and she called EMS.  Patient denies a history of intubation.  IV access unable to be obtained in route.  Patient found to be hypertensive and was given 1 nitroglycerin in route.  ECG with peaked T waves.  Patient found to be hypoxic at 80% and was transition from nonrebreather to CPAP and patient tolerated this well.  RT is at bedside and transitioned the patient to BiPAP.  Oxygen saturations in the high 90s.  Patient notes suprapubic pain.  Denies any rhinorrhea, sore throat, headaches.  She does not produce urine.  Patient denies any nausea or vomiting.  Level 5 caveat due to acuity of condition.    Past Medical History:  Diagnosis Date   Chronic kidney disease    Dialysis patient [facility])    History of kidney transplant 2012   kidney failure due to hypertension   Hypertension    Multiple gastric ulcers    Pneumonia 01/23/2015    Patient Active Problem List   Diagnosis Date Noted   Symptomatic anemia 12/13/2020   Medicare annual wellness visit, initial 10/31/2015   Hypertension    History of kidney transplant    Anemia in chronic kidney disease 10/01/2007   End stage renal disease on dialysis ([place]) 10/01/2007    Past Surgical History:  Procedure Laterality Date    ESOPHAGOGASTRODUODENOSCOPY N/A 04/01/2016   Procedure: ESOPHAGOGASTRODUODENOSCOPY (EGD);  Surgeon: Gatha Mayer, MD;  Location: [facility] ENDOSCOPY;  Service: Endoscopy;  Laterality: N/A;   ESOPHAGOGASTRODUODENOSCOPY (EGD) WITH PROPOFOL N/A 11/20/2016   Procedure: ESOPHAGOGASTRODUODENOSCOPY (EGD) WITH PROPOFOL;  Surgeon: Doran Stabler, MD;  Location: [place];  Service: Endoscopy;  Laterality: N/A;   KIDNEY TRANSPLANT Bilateral 2012     OB History     Gravida  1   Para      Term      Preterm      AB  1   Living  0      SAB  1   IAB      Ectopic      Multiple      Live Births              Family History  Problem Relation Age of Onset   Kidney disease Father    Lupus Maternal Grandmother    Cancer Maternal Grandfather    Colon cancer Neg Hx    Stomach cancer Neg Hx    Rectal cancer Neg Hx    Esophageal cancer Neg Hx    Liver cancer Neg Hx     Social History   Tobacco Use   Smoking status: Never   Smokeless tobacco: Never  Vaping Use   Vaping Use: Never used  Substance Use Topics   Alcohol use: No    Alcohol/week: 0.0 standard drinks  Drug use: No    Home Medications Prior to Admission medications   Medication Sig Start Date End Date Taking? Authorizing Provider  b complex-vitamin c-folic acid (NEPHRO-VITE) 0.8 MG TABS tablet Take 1 tablet by mouth daily. 10/24/14   [provider]  calcitRIOL (ROCALTROL) 0.25 MCG capsule Take 0.25 mcg by mouth daily.    [provider]  carvedilol (COREG) 25 MG tablet Take 1 tablet (25 mg total) by mouth 2 (two) times daily with a meal. 12/14/20   Virl Axe, MD  Cholecalciferol (VITAMIN D3) 25 MCG (1000 UT) CAPS Take 1,000 Units by mouth in the morning.    [provider]  Cyanocobalamin (VITAMIN B12) 1000 MCG TBCR Take 1,000 mcg by mouth daily.    [provider]  Drospirenone (SLYND) 4 MG TABS Take 1 tablet by mouth daily with breakfast. 12/11/20   Shelly Bombard, MD   fluticasone [facility]) 50 MCG/ACT nasal spray Place 1 spray into both nostrils daily as needed for allergies or rhinitis. 01/07/21   Rehman, Areeg N, DO  MAG-200 200 MG TABS Take 200 mg by mouth 2 (two) times daily. 11/02/20   [provider]  mirtazapine (REMERON) 15 MG tablet Take 15 mg by mouth in the morning and at bedtime. 11/01/20 11/01/21  [provider]  mycophenolate (MYFORTIC) 180 MG EC tablet Take 180 mg by mouth 2 (two) times daily. 01/15/18   [provider]  NIFEdipine (PROCARDIA XL/NIFEDICAL-XL) 90 MG 24 hr tablet Take 1 tablet (90 mg total) by mouth daily. 12/15/20 01/14/21  Virl Axe, MD  predniSONE (DELTASONE) 5 MG tablet Take 5 mg by mouth daily. 01/15/18   [provider]  tinidazole (TINDAMAX) 500 MG tablet Take 2 tablets (1,000 mg total) by mouth daily with breakfast. Patient not taking: Reported on 01/05/2021 12/11/20   Shelly Bombard, MD  TUMS 500 MG chewable tablet Chew 1 tablet by mouth 3 (three) times daily with meals. 11/02/20   [provider]  amLODipine (NORVASC) 10 MG tablet Take 1 tablet (10 mg total) by mouth every evening. Patient not taking: No sig reported 09/13/15 12/14/20  [name], MD  labetalol (NORMODYNE) 200 MG tablet Take 1 tablet (200 mg total) by mouth 2 (two) times daily. Patient not taking: No sig reported 04/02/16 12/14/20  Elgergawy, Silver Huguenin, MD    Allergies    Tape and Zosyn [piperacillin sod-tazobactam so]  Review of Systems   Review of Systems  All other systems reviewed and are negative. Ten systems reviewed and are negative for acute change, except as noted in the HPI.   Physical Exam Updated Vital Signs BP (!) 218/129   Pulse 89   Temp 98.3 F (36.8 C) (Rectal)   Resp 20   Ht 5' (1.524 m)   Wt 59 kg   LMP 04/24/2021 (Approximate)   SpO2 100%   BMI 25.39 kg/m   Physical Exam Vitals and nursing note reviewed.  Constitutional:      General: She is in acute distress.      Appearance: Normal appearance. She is normal weight. She is ill-appearing. She is not toxic-appearing or diaphoretic.  HENT:     Head: Normocephalic and atraumatic.     Right Ear: External ear normal.     Left Ear: External ear normal.     Nose: Nose normal.     Mouth/Throat:     Mouth: Mucous membranes are moist.     Pharynx: Oropharynx is clear. No oropharyngeal exudate or  posterior oropharyngeal erythema.  Eyes:     General: No scleral icterus.       Right eye: No discharge.        Left eye: No discharge.     Extraocular Movements: Extraocular movements intact.     Conjunctiva/sclera: Conjunctivae normal.  Cardiovascular:     Rate and Rhythm: Normal rate and regular rhythm.     Pulses: Normal pulses.     Heart sounds: Normal heart sounds. No murmur heard.   No friction rub. No gallop.     Comments: AV fistula in the left upper extremity with a palpable thrill. Pulmonary:     Effort: Respiratory distress present.     Breath sounds: No stridor. Rales present. No wheezing or rhonchi.     Comments: On BiPAP.  Crackles noted in the lung bases.  Oxygen saturations around 98%. Abdominal:     General: Abdomen is flat.     Palpations: Abdomen is soft.     Tenderness: There is abdominal tenderness.     Comments: Abdomen is flat and soft.  Mild tenderness in the suprapubic region.  Musculoskeletal:        General: Normal range of motion.     Cervical back: Normal range of motion and neck supple. No tenderness.  Skin:    General: Skin is warm and dry.  Neurological:     General: No focal deficit present.     Mental Status: She is alert and oriented to person, place, and time.  Psychiatric:        Mood and Affect: Mood normal.        Behavior: Behavior normal.   ED Results / Procedures / Treatments   Labs (all labs ordered are listed, but only abnormal results are displayed) Labs Reviewed  COMPREHENSIVE METABOLIC PANEL - Abnormal; Notable for the following components:      Result  Value   Sodium 133 (*)    Potassium >7.5 (*)    CO2 17 (*)    Glucose, Bld 58 (*)    BUN 100 (*)    Creatinine, Ser 20.36 (*)    Calcium 7.7 (*)    Albumin 3.3 (*)    GFR, Estimated 2 (*)    Anion gap 16 (*)    All other components within normal limits  CBC WITH DIFFERENTIAL/PLATELET - Abnormal; Notable for the following components:   RBC 3.46 (*)    Hemoglobin 10.3 (*)    HCT 32.1 (*)    RDW 21.4 (*)    nRBC 0.3 (*)    All other components within normal limits  I-STAT BETA HCG BLOOD, ED ([facility], [facility], [facility] ONLY) - Abnormal; Notable for the following components:   I-stat hCG, quantitative 6.3 (*)    All other components within normal limits  I-STAT CHEM 8, ED - Abnormal; Notable for the following components:   Sodium 132 (*)    Potassium 7.0 (*)    BUN 103 (*)    Creatinine, Ser >18.00 (*)    Glucose, Bld 51 (*)    Calcium, Ion 0.85 (*)    TCO2 18 (*)    Hemoglobin 10.5 (*)    HCT 31.0 (*)    All other components within normal limits  I-STAT ARTERIAL BLOOD GAS, ED - Abnormal; Notable for the following components:   pCO2 arterial 26.7 (*)    pO2, Arterial 119 (*)    Bicarbonate 17.6 (*)    TCO2 18 (*)    Acid-base deficit 6.0 (*)  Sodium 132 (*)    Potassium 7.4 (*)    Calcium, Ion 0.97 (*)    HCT 30.0 (*)    Hemoglobin 10.2 (*)    All other components within normal limits  CBG MONITORING, ED - Abnormal; Notable for the following components:   Glucose-Capillary 50 (*)    All other components within normal limits  TROPONIN I (HIGH SENSITIVITY) - Abnormal; Notable for the following components:   Troponin I (High Sensitivity) 42 (*)    All other components within normal limits  RESP PANEL BY RT-PCR (FLU A&B, COVID) ARPGX2  BRAIN NATRIURETIC PEPTIDE  CBG MONITORING, ED    EKG None  Radiology DG Chest Portable 1 View  Result Date: 05/20/2021 CLINICAL DATA:  Chest xray ordered for SOB. Pt missed recent dialysis. No triage notes. EXAM: PORTABLE CHEST 1 VIEW  COMPARISON:  None. FINDINGS: Cardiac silhouette borderline enlarged. No mediastinal or hilar masses. Lungs demonstrate interstitial prominence with hazy airspace opacities noted in the lower lungs, new since the prior exam. No visualized pleural effusion.  No pneumothorax. Skeletal structures are grossly intact. IMPRESSION: 1. Interstitial thickening with mild hazy lower lung zone opacity consistent with pulmonary edema. Electronically Signed   By: Lajean Manes M.D.   On: 05/20/2021 19:52    Procedures .Critical Care Performed by: Rayna Sexton, PA-C Authorized by: Rayna Sexton, PA-C   Critical care provider statement:    Critical care time (minutes):  60   Critical care was necessary to treat or prevent imminent or life-threatening deterioration of the following conditions:  Respiratory failure and renal failure   Critical care was time spent personally by me on the following activities:  Development of treatment plan with patient or surrogate, discussions with consultants, evaluation of patient's response to treatment, examination of patient, ordering and review of laboratory studies, ordering and review of radiographic studies, ordering and performing treatments and interventions, pulse oximetry, re-evaluation of patient's condition and review of old charts   Medications Ordered in ED Medications  sodium zirconium cyclosilicate (LOKELMA) packet 10 g (0 g Oral Hold 05/20/21 2255)  nitroGLYCERIN 50 mg in dextrose 5 % 250 mL (0.2 mg/mL) infusion (30 mcg/min Intravenous Rate/Dose Change 05/20/21 2258)  calcium gluconate 1 g/ 50 mL sodium chloride IVPB (1,000 mg Intravenous New Bag/Given 05/20/21 2254)  Chlorhexidine Gluconate Cloth 2 % PADS 6 each (has no administration in time range)  albuterol (PROVENTIL) (2.5 MG/3ML) 0.083% nebulizer solution 10 mg (10 mg Nebulization Given 05/20/21 2012)  insulin aspart (novoLOG) injection 5 Units (5 Units Intravenous Given 05/20/21 2149)    And   dextrose 50 % solution 50 mL (50 mLs Intravenous Given 05/20/21 2149)  dextrose 50 % solution 50 mL (50 mLs Intravenous Given 05/20/21 2249)  dextrose 50 % solution 50 mL (50 mLs Intravenous Given 05/20/21 2247)    ED Course  I have reviewed the triage vital signs and the nursing notes.  Pertinent labs & imaging results that were available during my care of the patient were reviewed by me and considered in my medical decision making (see chart for details).  Clinical Course as of 05/20/21 2306  Nancy Fetter May 20, 2021  1956 Nursing staff is having difficulty with IV access.  IV team has been consulted.  ABG showing a potassium of 7.4.  ECG with peaked T waves.  Will treat with Lokelma, insulin, dextrose, albuterol. [LJ]  1957 DG Chest Portable 1 View Concerning for pulmonary edema.  Patient significantly hypertensive with a BP of 236/145.  Will start on a nitroglycerin drip. [LJ]  2203 Patient discussed with Dr. Justin Mend with nephrology.  They agree with our plan.  Recommend medicine admission.  They are going to attempt to dialyze the patient tonight but patient will most likely be dialyzed tomorrow morning. [LJ]  2206 Glucose-Capillary(!): 50 We will give 2 A of dextrose. [LJ]    Clinical Course User Index [LJ] Rayna Sexton, PA-C   MDM Rules/Calculators/A&P                          Pt is a 36 y.o. female with a history of ESRD on HD who missed dialysis 2 days ago and presented in respiratory failure.    Labs: CBC, CMP, troponin, i-STAT Chem-8, i-STAT beta-hCG, i-STAT ABG, BNP, respiratory panel with abnormalities as noted above.  Imaging: Chest x-ray shows interstitial thickening with mild hazy lower lung zone opacity consistent with pulmonary edema.  I, Rayna Sexton, PA-C, personally reviewed and evaluated these images and lab results as part of my medical decision-making.  Patient placed on BiPAP upon arrival.  Tolerating well with oxygen saturations at 100%.  Chest x-ray  concerning for pulmonary edema.  Started on a nitroglycerin drip.  Patient persistently hypertensive.  Also found to be hyperkalemic with a potassium greater than 7.5.  ECG with peaked T waves.  Patient started on albuterol, Lokelma, insulin/dextrose.  Patient discussed with nephrology.  They will attempt to dialyze the patient tonight but this will most likely occur tomorrow morning.  Given patient's current state, labs, as well as significant comorbidities, feel that admission to the ICU is warranted.  We will discuss with the intensivist.  Note: Portions of this report may have been transcribed using voice recognition software. Every effort was made to ensure accuracy; however, inadvertent computerized transcription errors may be present.   Final Clinical Impression(s) / ED Diagnoses Final diagnoses:  Acute respiratory failure with hypoxia [facility])  Dialysis patient [facility])  Hyperkalemia   Rx / DC Orders ED Discharge Orders     None        Rayna Sexton, PA-C 05/20/21 2306    Luna Fuse, MD 05/23/21 1434

## 2021-05-20 NOTE — H&P (Addendum)
NAME:  Kerry Cook, MRN:  [id number], DOB:  May 14, 1986, LOS: 0 ADMISSION DATE:  05/20/2021, CONSULTATION DATE:  11/27 REFERRING MD:  Vernia Buff, CHIEF COMPLAINT:  respiratory distress   History of Present Illness:    Kerry Cook, is a 35 y.o. female, who presented to the [facility] ED with a chief complaint of respiratory disstress  They have a pertinent past medical history of CKD on HD (MWF), kidney transplant (patient unsure of date, reports being off anti rejection drugs for 2-3 months), HTN, gastric ulcers.   She reportedly missed HD on Friday 11/25. She reports developing SOB, cough, weakness, fatigue, and diarrhea on 11/26. On 11/27 her SOB worsened. EMS was called.   ED course was notable for hypoxia in the 80's on arrival and a BP of 260/140. She was started on BiPAP and a nitroglycerin gtt. K was found to be 7. She was temporized withdextrose/insulin, albuterol, lokelma, calcium in ER. CXR concerning for pulmonary edema. No neuro deficits. Reports being compliant with antihypertensives. Denies illicit drug use including cocaine. Reports being anuric.  Nephrology was consulted with plans for urgent HD inpatient.  PCCM was consulted for admission  Pertinent  Medical History  CKD on HD (MWF), HTN, gastric ulcers.   Significant Hospital Events: Including procedures, antibiotic start and stop dates in addition to other pertinent events   11/27 presented to [facility]. BiPAP, nitro gtt  Interim History / Subjective:  See above  Subjective exam limited by BiPAP: states chest pain and SOB have improved s/p bipap intiation, no current chest pain.   Objective   Blood pressure (!) 208/126, pulse 79, temperature 98.3 F (36.8 C), temperature source Rectal, resp. rate 19, height 5' (1.524 m), weight 59 kg, last menstrual period 04/24/2021, SpO2 100 %.    FiO2 (%):  [40 %] 40 %  No intake or output data in the 24 hours ending 05/20/21 2245 Filed Weights   05/20/21 1928  Weight: 59 kg     Examination: General: In bed, NAD, appears comfortable HEENT: MM pink/moist, anicteric, atraumatic Neuro: RASS 0, PERRL 34mm, GCS 15 CV: S1S2, NSR, no m/r/g appreciated PULM:  crackles in upper and lower lobes, trachea midline, chest expansion symmetric, on bipap GI: soft, bsx4 active, non-tender   Extremities: warm/dry, no pretibial edema, capillary refill less than 3 seconds  Skin:  no rashes or lesions noted, L fistula with bruit and thrill   Resolved Hospital Problem list     Assessment & Plan:  Hypertensive emergency HX HTN BP 260/140 in ED, hypoxic, Suspect secondary to volume overload s/p missed HD. Pulmonary edema on CXR. BP down to 230 s/p nitro gtt. Troponin I 42, Now in low 200's. Endorses compliance with home antihypertensive regimen. No significant ST changes noted on 12 lead. GCS 15 on exam. -Continue nitro gtt. Goal SBP 180-190 over next few hours. Reevaluate nitro gtt s/p HD. -Urgent HD per neph -Continue tele -Continue bipap -Follow up BNP -Monitor neuro status. If patient becomes altered obtain stat head CT. -Resume carvedilol 25mg  BID and nifedipine 90mg  xl  Acute respiratory failure with hypoxia Presented with SOB. 80% on room air in ED. Suspect secondary to volume overload s/p missed HD. CXR concerning for pulmonary edema. -Continue BiPAP -Continue nitro gtt as discussed  ESRD on HD (MWF)  Acute Electrolyte Abnormality- Hyponatremia, hyperkalemia HX kidney transplant Anuric, Missed HD on Friday. NA 132, K 7, BUN 100, Creat 20, slight peak in t waves -Neph consulted. Per neph will preform urgent dialysis -  temporizing measures with dextrose/insulin, albuterol, lokelma, calcium in ER -Recheck electrolytes post HD -Continue telemetry -Patient reports reports being off anti rejection drugs for 2-3 months (prednisone, myfortic)  Prolonged QTC QTC 522 -Monitor on tele -Follow up S/P HD  Anemia, normocytic suspect chronic, HGB 10.3, no signs of active  bleeding -Transfuse PRBC if HBG less than 7 -Obtain AM CBC to trend H&H -Monitor for signs of bleeding  Hypoglycemia ?S/P insulin/dextrose -PRN dextrose -q4h CBG  Beta hCG elevation 6.3. LMP per patient 11/1. Notified of elevation. Patient states that she is not concerned that she could be pregnant  HX gastric ulcers -PPI  Best Practice (right click and "Reselect all SmartList Selections" daily)   Diet/type: NPO w/ oral meds DVT prophylaxis: prophylactic heparin  GI prophylaxis: PPI Lines: N/A Foley:  N/A Code Status:  full code Last date of multidisciplinary goals of care discussion [pending]  Labs   CBC: Recent Labs  Lab 05/20/21 1949 05/20/21 2058 05/20/21 2153  WBC  --  8.7  --   NEUTROABS  --  6.4  --   HGB 10.2* 10.3* 10.5*  HCT 30.0* 32.1* 31.0*  MCV  --  92.8  --   PLT  --  158  --     Basic Metabolic Panel: Recent Labs  Lab 05/20/21 1949 05/20/21 2058 05/20/21 2153  NA 132* 133* 132*  K 7.4* >7.5* 7.0*  CL  --  100 106  CO2  --  17*  --   GLUCOSE  --  58* 51*  BUN  --  100* 103*  CREATININE  --  20.36* >18.00*  CALCIUM  --  7.7*  --    GFR: CrCl cannot be calculated (This lab value cannot be used to calculate CrCl because it is not a number: >18.00). Recent Labs  Lab 05/20/21 2058  WBC 8.7    Liver Function Tests: Recent Labs  Lab 05/20/21 2058  AST 17  ALT 11  ALKPHOS 71  BILITOT 0.7  PROT 7.4  ALBUMIN 3.3*   No results for input(s): LIPASE, AMYLASE in the last 168 hours. No results for input(s): AMMONIA in the last 168 hours.  ABG    Component Value Date/Time   PHART 7.426 05/20/2021 1949   PCO2ART 26.7 (L) 05/20/2021 1949   PO2ART 119 (H) 05/20/2021 1949   HCO3 17.6 (L) 05/20/2021 1949   TCO2 18 (L) 05/20/2021 2153   ACIDBASEDEF 6.0 (H) 05/20/2021 1949   O2SAT 99.0 05/20/2021 1949     Coagulation Profile: No results for input(s): INR, PROTIME in the last 168 hours.  Cardiac Enzymes: No results for input(s):  CKTOTAL, CKMB, CKMBINDEX, TROPONINI in the last 168 hours.  HbA1C: Hgb A1c MFr Bld  Date/Time Value Ref Range Status  12/11/2020 01:55 PM 4.8 4.8 - 5.6 % Final    Comment:             Prediabetes: 5.7 - 6.4          Diabetes: >6.4          Glycemic control for adults with diabetes: <7.0     CBG: Recent Labs  Lab 05/20/21 2144 05/20/21 2241  GLUCAP 50* 91    Review of Systems:   Positives in bold  Gen: fever, chills, weight change, fatigue, night sweats HEENT:  blurred vision, double vision, hearing loss, tinnitus, sinus congestion, rhinorrhea, sore throat, neck stiffness, dysphagia PULM:  shortness of breath, cough, sputum production, hemoptysis, wheezing CV: chest pain, edema, orthopnea, paroxysmal nocturnal  dyspnea, palpitations GI:  abdominal pain, nausea, vomiting, diarrhea, hematochezia, melena, constipation, change in bowel habits GU: dysuria, hematuria, polyuria, oliguria, urethral discharge Endocrine: hot or cold intolerance, polyuria, polyphagia or appetite change Derm: rash, dry skin, scaling or peeling skin change Heme: easy bruising, bleeding, bleeding gums Neuro: headache, numbness, weakness, slurred speech, loss of memory or consciousness   Past Medical History:  She,  has a past medical history of Chronic kidney disease, Dialysis patient [facility]), History of kidney transplant (2012), Hypertension, Multiple gastric ulcers, and Pneumonia (01/23/2015).   Surgical History:   Past Surgical History:  Procedure Laterality Date   ESOPHAGOGASTRODUODENOSCOPY N/A 04/01/2016   Procedure: ESOPHAGOGASTRODUODENOSCOPY (EGD);  Surgeon: Gatha Mayer, MD;  Location: [facility] ENDOSCOPY;  Service: Endoscopy;  Laterality: N/A;   ESOPHAGOGASTRODUODENOSCOPY (EGD) WITH PROPOFOL N/A 11/20/2016   Procedure: ESOPHAGOGASTRODUODENOSCOPY (EGD) WITH PROPOFOL;  Surgeon: Doran Stabler, MD;  Location: [place];  Service: Endoscopy;  Laterality: N/A;   KIDNEY TRANSPLANT Bilateral 2012      Social History:   reports that she has never smoked. She has never used smokeless tobacco. She reports that she does not drink alcohol and does not use drugs.   Family History:  Her family history includes Cancer in her maternal grandfather; Kidney disease in her father; Lupus in her maternal grandmother. There is no history of Colon cancer, Stomach cancer, Rectal cancer, Esophageal cancer, or Liver cancer.   Allergies Allergies  Allergen Reactions   Tape Itching and Other (See Comments)    Plastic/clear tape tears up the patient's skin   Zosyn [Piperacillin Sod-Tazobactam So] Itching    Has patient had a PCN reaction causing immediate rash, facial/tongue/throat swelling, SOB or lightheadedness with hypotension: No Has patient had a PCN reaction causing severe rash involving mucus membranes or skin necrosis: No Has patient had a PCN reaction that required hospitalization: No Has patient had a PCN reaction occurring within the last 10 years: No If all of the above answers are "NO", then may proceed with Cephalosporin use.      Home Medications  Prior to Admission medications   Medication Sig Start Date End Date Taking? Authorizing Provider  b complex-vitamin c-folic acid (NEPHRO-VITE) 0.8 MG TABS tablet Take 1 tablet by mouth daily. 10/24/14   [provider]  calcitRIOL (ROCALTROL) 0.25 MCG capsule Take 0.25 mcg by mouth daily.    [provider]  carvedilol (COREG) 25 MG tablet Take 1 tablet (25 mg total) by mouth 2 (two) times daily with a meal. 12/14/20   Virl Axe, MD  Cholecalciferol (VITAMIN D3) 25 MCG (1000 UT) CAPS Take 1,000 Units by mouth in the morning.    [provider]  Cyanocobalamin (VITAMIN B12) 1000 MCG TBCR Take 1,000 mcg by mouth daily.    [provider]  Drospirenone (SLYND) 4 MG TABS Take 1 tablet by mouth daily with breakfast. 12/11/20   Shelly Bombard, MD  fluticasone [facility]) 50 MCG/ACT nasal spray Place 1 spray into  both nostrils daily as needed for allergies or rhinitis. 01/07/21   Rehman, Areeg N, DO  MAG-200 200 MG TABS Take 200 mg by mouth 2 (two) times daily. 11/02/20   [provider]  mirtazapine (REMERON) 15 MG tablet Take 15 mg by mouth in the morning and at bedtime. 11/01/20 11/01/21  [provider]  mycophenolate (MYFORTIC) 180 MG EC tablet Take 180 mg by mouth 2 (two) times daily. 01/15/18   [provider]  NIFEdipine (PROCARDIA XL/NIFEDICAL-XL) 90 MG 24  hr tablet Take 1 tablet (90 mg total) by mouth daily. 12/15/20 01/14/21  Virl Axe, MD  predniSONE (DELTASONE) 5 MG tablet Take 5 mg by mouth daily. 01/15/18   [provider]  tinidazole (TINDAMAX) 500 MG tablet Take 2 tablets (1,000 mg total) by mouth daily with breakfast. Patient not taking: Reported on 01/05/2021 12/11/20   Shelly Bombard, MD  TUMS 500 MG chewable tablet Chew 1 tablet by mouth 3 (three) times daily with meals. 11/02/20   [provider]  amLODipine (NORVASC) 10 MG tablet Take 1 tablet (10 mg total) by mouth every evening. Patient not taking: No sig reported 09/13/15 12/14/20  [name], MD  labetalol (NORMODYNE) 200 MG tablet Take 1 tablet (200 mg total) by mouth 2 (two) times daily. Patient not taking: No sig reported 04/02/16 12/14/20  Elgergawy, Silver Huguenin, MD     Critical care time: 52 minutes    [employer] MSN, APRN, AGACNP-BC [facility]  05/20/2021 , 11:40 PM  Please see [email or web address] for pager details  If no response, please call [phone] After hours, please call Elink at [phone]   Attending MD note  Patient was independently seen and examined, treatment plan was discussed with the  Advance Practice Provider. I agree with the above note by Wandra Arthurs, NP. I have personally reviewed the clinical findings, labs, ECG, imaging studies and management of this patient in detail. I have also reviewed the orders written for  this patient which were under my direction. I agree with the documentation, as recorded by the Advance Practice Provider.   Briefly, Kerry Cook is a 35 y.o. female with HTN< respiratory distress, missed HD>  Cont current medications, bipap, plan for admission to ICU and HD overnight.    My cc time 35 minutes  This patient is critically ill, requiring high complexity decision making for assessment and plan, frequent evaluation, application of advanced monitoring and extensive interpretations of multiple databases.    Critical Care time devoted to patient care services described in this note is 35 minutes, not including time spent on procedures, teaching or supervising.    Collier Bullock, MD

## 2021-05-20 NOTE — Progress Notes (Signed)
Received call from ER   patient  ESRD  MWF dialysis   missed dialysis Friday   comes to ER with SOB and increase K on labs    Asked to provide urgent dialysis    discussed with HD nurse on call  Patient dialyzes in [place] : 3 45    EDW 60    180NRe Optiflux Ca 3  K 2    Labs in ER   Na 132  K 7   BUN 103 Cr 13  Ca 7.7  Hb 10.5  BP 209/106  P 90  T 98  sats 100% CPAP  CXR  pulmonary edema  Will perform urgent dialysis  Temporizing measures  Dextrose/Insulin   lokelma   Calcium in ER

## 2021-05-20 NOTE — ED Notes (Signed)
Provider at the bedside.  

## 2021-05-20 NOTE — ED Triage Notes (Signed)
Pt bib ems from home c/o sob since yesterday. Pt has dialysis MWF. Pt stated she is usually compliant with tx; however, she missed Friday appt. She was unable to get an appt rescheduled sooner. Pt has had flu like symptoms for 2 days with cough, diarrhea, and fatigue. Pt has HTN and compliant with medication. Pt was given 1 nitroglycerin. Chest pain midsternum 5/10 pain. Lower abdominal pain.   BP 260/140  BP after nitro 158 systolic CBG 77 HR 74 Room Air 80% RR 38/44

## 2021-05-21 ENCOUNTER — Inpatient Hospital Stay (HOSPITAL_COMMUNITY): Payer: Medicare Other

## 2021-05-21 DIAGNOSIS — I161 Hypertensive emergency: Secondary | ICD-10-CM | POA: Diagnosis not present

## 2021-05-21 LAB — RENAL FUNCTION PANEL
Albumin: 2.9 g/dL — ABNORMAL LOW (ref 3.5–5.0)
Anion gap: 16 — ABNORMAL HIGH (ref 5–15)
BUN: 104 mg/dL — ABNORMAL HIGH (ref 6–20)
CO2: 18 mmol/L — ABNORMAL LOW (ref 22–32)
Calcium: 7.9 mg/dL — ABNORMAL LOW (ref 8.9–10.3)
Chloride: 101 mmol/L (ref 98–111)
Creatinine, Ser: 20.74 mg/dL — ABNORMAL HIGH (ref 0.44–1.00)
GFR, Estimated: 2 mL/min — ABNORMAL LOW (ref >60)
Glucose, Bld: 77 mg/dL (ref 70–99)
Phosphorus: 7.5 mg/dL — ABNORMAL HIGH (ref 2.5–4.6)
Potassium: 5.2 mmol/L — ABNORMAL HIGH (ref 3.5–5.1)
Sodium: 135 mmol/L (ref 135–145)

## 2021-05-21 LAB — CBC
HCT: 28.8 % — ABNORMAL LOW (ref 36.0–46.0)
HCT: 29.5 % — ABNORMAL LOW (ref 36.0–46.0)
Hemoglobin: 9.4 g/dL — ABNORMAL LOW (ref 12.0–15.0)
Hemoglobin: 9.6 g/dL — ABNORMAL LOW (ref 12.0–15.0)
MCH: 30.1 pg (ref 26.0–34.0)
MCH: 29.4 pg (ref 26.0–34.0)
MCHC: 32.6 g/dL (ref 30.0–36.0)
MCHC: 32.5 g/dL (ref 30.0–36.0)
MCV: 92.3 fL (ref 80.0–100.0)
MCV: 90.5 fL (ref 80.0–100.0)
Platelets: 164 10*3/uL (ref 150–400)
Platelets: 156 10*3/uL (ref 150–400)
RBC: 3.12 MIL/uL — ABNORMAL LOW (ref 3.87–5.11)
RBC: 3.26 MIL/uL — ABNORMAL LOW (ref 3.87–5.11)
RDW: 21.2 % — ABNORMAL HIGH (ref 11.5–15.5)
RDW: 21 % — ABNORMAL HIGH (ref 11.5–15.5)
WBC: 7.3 10*3/uL (ref 4.0–10.5)
WBC: 5.6 10*3/uL (ref 4.0–10.5)
nRBC: 0.3 % — ABNORMAL HIGH (ref 0.0–0.2)
nRBC: 0.4 % — ABNORMAL HIGH (ref 0.0–0.2)

## 2021-05-21 LAB — COMPREHENSIVE METABOLIC PANEL
ALT: 11 U/L (ref 0–44)
AST: 18 U/L (ref 15–41)
Albumin: 3.1 g/dL — ABNORMAL LOW (ref 3.5–5.0)
Alkaline Phosphatase: 67 U/L (ref 38–126)
Anion gap: 11 (ref 5–15)
BUN: 30 mg/dL — ABNORMAL HIGH (ref 6–20)
CO2: 24 mmol/L (ref 22–32)
Calcium: 8.4 mg/dL — ABNORMAL LOW (ref 8.9–10.3)
Chloride: 99 mmol/L (ref 98–111)
Creatinine, Ser: 8.21 mg/dL — ABNORMAL HIGH (ref 0.44–1.00)
GFR, Estimated: 6 mL/min — ABNORMAL LOW (ref >60)
Glucose, Bld: 90 mg/dL (ref 70–99)
Potassium: 3.1 mmol/L — ABNORMAL LOW (ref 3.5–5.1)
Sodium: 134 mmol/L — ABNORMAL LOW (ref 135–145)
Total Bilirubin: 0.7 mg/dL (ref 0.3–1.2)
Total Protein: 7.2 g/dL (ref 6.5–8.1)

## 2021-05-21 LAB — BASIC METABOLIC PANEL
Anion gap: 10 (ref 5–15)
BUN: 37 mg/dL — ABNORMAL HIGH (ref 6–20)
CO2: 24 mmol/L (ref 22–32)
Calcium: 8.3 mg/dL — ABNORMAL LOW (ref 8.9–10.3)
Chloride: 99 mmol/L (ref 98–111)
Creatinine, Ser: 11.08 mg/dL — ABNORMAL HIGH (ref 0.44–1.00)
GFR, Estimated: 4 mL/min — ABNORMAL LOW (ref >60)
Glucose, Bld: 79 mg/dL (ref 70–99)
Potassium: 5 mmol/L (ref 3.5–5.1)
Sodium: 133 mmol/L — ABNORMAL LOW (ref 135–145)

## 2021-05-21 LAB — GLUCOSE, CAPILLARY
Glucose-Capillary: 84 mg/dL (ref 70–99)
Glucose-Capillary: 128 mg/dL — ABNORMAL HIGH (ref 70–99)
Glucose-Capillary: 61 mg/dL — ABNORMAL LOW (ref 70–99)
Glucose-Capillary: 82 mg/dL (ref 70–99)
Glucose-Capillary: 61 mg/dL — ABNORMAL LOW (ref 70–99)
Glucose-Capillary: 139 mg/dL — ABNORMAL HIGH (ref 70–99)
Glucose-Capillary: 79 mg/dL (ref 70–99)
Glucose-Capillary: 145 mg/dL — ABNORMAL HIGH (ref 70–99)
Glucose-Capillary: 119 mg/dL — ABNORMAL HIGH (ref 70–99)

## 2021-05-21 LAB — HEPATITIS B SURFACE ANTIBODY,QUALITATIVE: Hep B S Ab: REACTIVE — AB

## 2021-05-21 LAB — MRSA NEXT GEN BY PCR, NASAL: MRSA by PCR Next Gen: NOT DETECTED

## 2021-05-21 LAB — HEPATITIS B SURFACE ANTIGEN: Hepatitis B Surface Ag: NONREACTIVE

## 2021-05-21 LAB — RESP PANEL BY RT-PCR (FLU A&B, COVID) ARPGX2
Influenza A by PCR: NEGATIVE
Influenza B by PCR: NEGATIVE
SARS Coronavirus 2 by RT PCR: NEGATIVE

## 2021-05-21 LAB — MAGNESIUM
Magnesium: 2.7 mg/dL — ABNORMAL HIGH (ref 1.7–2.4)
Magnesium: 2.3 mg/dL (ref 1.7–2.4)

## 2021-05-21 MED ORDER — DEXTROSE 50 % IV SOLN
INTRAVENOUS | Status: AC
  Filled 2021-05-21: qty 50

## 2021-05-21 MED ORDER — KETOROLAC TROMETHAMINE 15 MG/ML IJ SOLN
15.0000 mg | Freq: Once | INTRAMUSCULAR | Status: AC
Start: 2021-05-21 — End: 2021-05-21
  Administered 2021-05-21: 06:00:00 15 mg via INTRAVENOUS
  Filled 2021-05-21: qty 1

## 2021-05-21 MED ORDER — ONDANSETRON HCL 4 MG/2ML IJ SOLN: 4.0000 mg | Freq: Four times a day (QID) | INTRAMUSCULAR | Status: DC | PRN

## 2021-05-21 MED ORDER — DEXTROSE 50 % IV SOLN
12.5000 g | INTRAVENOUS | Status: AC
  Administered 2021-05-21: 08:00:00 12.5 g via INTRAVENOUS

## 2021-05-21 MED ORDER — CLEVIDIPINE BUTYRATE 0.5 MG/ML IV EMUL
0.0000 mg/h | INTRAVENOUS | Status: DC
  Administered 2021-05-21: 12:00:00 2 mg/h via INTRAVENOUS
  Administered 2021-05-21: 06:00:00 1 mg/h via INTRAVENOUS
  Filled 2021-05-21: qty 100
  Filled 2021-05-21: qty 50
  Filled 2021-05-21 (×2): qty 100

## 2021-05-21 MED ORDER — DEXTROSE 50 % IV SOLN
12.5000 g | INTRAVENOUS | Status: AC
  Administered 2021-05-21: 04:00:00 12.5 g via INTRAVENOUS

## 2021-05-21 MED ORDER — INFLUENZA VAC SPLIT QUAD 0.5 ML IM SUSY
0.5000 mL | PREFILLED_SYRINGE | INTRAMUSCULAR | Status: AC
  Administered 2021-05-22: 0.5 mL via INTRAMUSCULAR
  Filled 2021-05-21: qty 0.5

## 2021-05-21 MED ORDER — EPINEPHRINE 1 MG/10ML IJ SOSY
PREFILLED_SYRINGE | INTRAMUSCULAR | Status: AC
  Filled 2021-05-21: qty 10

## 2021-05-21 MED ORDER — KETOROLAC TROMETHAMINE 15 MG/ML IJ SOLN
15.0000 mg | Freq: Once | INTRAMUSCULAR | Status: AC
Start: 2021-05-21 — End: 2021-05-21
  Administered 2021-05-21: 02:00:00 15 mg via INTRAVENOUS
  Filled 2021-05-21: qty 1

## 2021-05-21 MED ORDER — ORAL CARE MOUTH RINSE
15.0000 mL | Freq: Two times a day (BID) | OROMUCOSAL | Status: DC
  Administered 2021-05-21 – 2021-05-22 (×5): 15 mL via OROMUCOSAL

## 2021-05-21 MED ORDER — POTASSIUM CHLORIDE CRYS ER 20 MEQ PO TBCR: 20.0000 meq | EXTENDED_RELEASE_TABLET | Freq: Two times a day (BID) | ORAL | Status: DC

## 2021-05-21 NOTE — Progress Notes (Signed)
Patient experienced vomiting of bile at 0515 and constant severe headache. MD notified at [phone] and CT ordered. No further concerns.

## 2021-05-21 NOTE — Progress Notes (Signed)
NAME:  Kerry Cook, MRN:  [id number], DOB:  [birth year], LOS: 1 ADMISSION DATE:  05/20/2021, CONSULTATION DATE:  11/27 REFERRING MD:  Vernia Buff, CHIEF COMPLAINT:  respiratory distress   History of Present Illness:    Kerry Cook, is a 35 y.o. female, who presented to the [facility] ED with a chief complaint of respiratory disstress  They have a pertinent past medical history of CKD on HD (MWF), kidney transplant (patient unsure of date, reports being off anti rejection drugs for 2-3 months), HTN, gastric ulcers.   She reportedly missed HD on Friday 11/25. She reports developing SOB, cough, weakness, fatigue, and diarrhea on 11/26. On 11/27 her SOB worsened. EMS was called.   ED course was notable for hypoxia in the 80's on arrival and a BP of 260/140. She was started on BiPAP and a nitroglycerin gtt. K was found to be 7. She was temporized withdextrose/insulin, albuterol, lokelma, calcium in ER. CXR concerning for pulmonary edema. No neuro deficits. Reports being compliant with antihypertensives. Denies illicit drug use including cocaine. Reports being anuric.  Nephrology was consulted with plans for urgent HD inpatient.  PCCM was consulted for admission  Pertinent  Medical History  CKD on HD (MWF), HTN, gastric ulcers.   Significant Hospital Events: Including procedures, antibiotic start and stop dates in addition to other pertinent events   11/27 presented to [facility]. BiPAP, nitro gtt 11/28 off nitro, off BiPAP, on Cleviprex denies chest pain   Interim History / Subjective:   Denies chest pain Denies shortness of breath  Objective   Blood pressure (!) 189/122, pulse 78, temperature 98.1 F (36.7 C), temperature source Oral, resp. rate 16, height 5' (1.524 m), weight 59 kg, last menstrual period 04/24/2021, SpO2 100 %.    FiO2 (%):  [40 %] 40 %   Intake/Output Summary (Last 24 hours) at 05/21/2021 0853 Last data filed at 05/21/2021 0601 Gross per 24 hour  Intake 96.95 ml   Output 2863 ml  Net -2766.05 ml   Filed Weights   05/20/21 1928 05/21/21 0145  Weight: 59 kg 59 kg    Examination: General: Comfortable HEENT: Moist oral mucosa Neuro: Moving all extremities CV: S1-S2 appreciated PULM: Clear breath sounds GI: Bowel sounds appreciated Extremities: No edema Skin:  no rashes or lesions noted, L fistula with bruit and thrill   Resolved Hospital Problem list     Assessment & Plan:   Hypertensive emergency -Post dialysis -On Cleviprex -Continue cardiac monitoring -Monitor neuro status -Oral medications resumed -Continue to wean Cleviprex  Acute respiratory failure -Possibly secondary to fluid overload, pulmonary edema -Remains on oxygen supplementation -Will continue to wean  End-stage renal disease on hemodialysis History of kidney transplant -Off antirejection medications for 2 to 3 months-prednisone, Myfortic  Prolonged QTC -Monitor  Anemia -Chronic, related to chronic kidney disease  Beta hCG elevation -Needs to be followed -LMP-11/1  History of gastric ulcers -PPI  Will wean Cleviprex, maintain systolic less than 829  Best Practice (right click and "Reselect all SmartList Selections" daily)   Diet/type: NPO w/ oral meds DVT prophylaxis: prophylactic heparin  GI prophylaxis: PPI Lines: N/A Foley:  N/A Code Status:  full code Last date of multidisciplinary goals of care discussion [pending]  Labs   CBC: Recent Labs  Lab 05/20/21 1949 05/20/21 2058 05/20/21 2153 05/21/21 0238 05/21/21 0500  WBC  --  8.7  --  7.3 5.6  NEUTROABS  --  6.4  --   --   --   HGB  10.2* 10.3* 10.5* 9.4* 9.6*  HCT 30.0* 32.1* 31.0* 28.8* 29.5*  MCV  --  92.8  --  92.3 90.5  PLT  --  158  --  164 166    Basic Metabolic Panel: Recent Labs  Lab 05/20/21 1949 05/20/21 2058 05/20/21 2153 05/21/21 0237 05/21/21 0238 05/21/21 0500  NA 132* 133* 132* 135  --  134*  K 7.4* >7.5* 7.0* 5.2*  --  3.1*  CL  --  100 106 101  --  99   CO2  --  17*  --  18*  --  24  GLUCOSE  --  58* 51* 77  --  90  BUN  --  100* 103* 104*  --  30*  CREATININE  --  20.36* >18.00* 20.74*  --  8.21*  CALCIUM  --  7.7*  --  7.9*  --  8.4*  MG  --   --   --   --  2.7* 2.3  PHOS  --   --   --  7.5*  --   --    GFR: Estimated Creatinine Clearance: 7.8 mL/min (A) (by C-G formula based on SCr of 8.21 mg/dL (H)). Recent Labs  Lab 05/20/21 2058 05/21/21 0238 05/21/21 0500  WBC 8.7 7.3 5.6    Liver Function Tests: Recent Labs  Lab 05/20/21 2058 05/21/21 0237 05/21/21 0500  AST 17  --  18  ALT 11  --  11  ALKPHOS 71  --  67  BILITOT 0.7  --  0.7  PROT 7.4  --  7.2  ALBUMIN 3.3* 2.9* 3.1*   No results for input(s): LIPASE, AMYLASE in the last 168 hours. No results for input(s): AMMONIA in the last 168 hours.  ABG    Component Value Date/Time   PHART 7.426 05/20/2021 1949   PCO2ART 26.7 (L) 05/20/2021 1949   PO2ART 119 (H) 05/20/2021 1949   HCO3 17.6 (L) 05/20/2021 1949   TCO2 18 (L) 05/20/2021 2153   ACIDBASEDEF 6.0 (H) 05/20/2021 1949   O2SAT 99.0 05/20/2021 1949     Coagulation Profile: No results for input(s): INR, PROTIME in the last 168 hours.  Cardiac Enzymes: No results for input(s): CKTOTAL, CKMB, CKMBINDEX, TROPONINI in the last 168 hours.  HbA1C: Hgb A1c MFr Bld  Date/Time Value Ref Range Status  12/11/2020 01:55 PM 4.8 4.8 - 5.6 % Final    Comment:             Prediabetes: 5.7 - 6.4          Diabetes: >6.4          Glycemic control for adults with diabetes: <7.0     CBG: Recent Labs  Lab 05/21/21 0358 05/21/21 0425 05/21/21 0540 05/21/21 0725 05/21/21 0747  GLUCAP 61* 128* 82 61* 139*    Review of Systems:   Positives in bold  Gen: fever, chills, weight change, fatigue, night sweats HEENT:  blurred vision, double vision, hearing loss, tinnitus, sinus congestion, rhinorrhea, sore throat, neck stiffness, dysphagia PULM:  shortness of breath, cough, sputum production, hemoptysis,  wheezing CV: chest pain, edema, orthopnea, paroxysmal nocturnal dyspnea, palpitations GI:  abdominal pain, nausea, vomiting, diarrhea, hematochezia, melena, constipation, change in bowel habits GU: dysuria, hematuria, polyuria, oliguria, urethral discharge Endocrine: hot or cold intolerance, polyuria, polyphagia or appetite change Derm: rash, dry skin, scaling or peeling skin change Heme: easy bruising, bleeding, bleeding gums Neuro: headache, numbness, weakness, slurred speech, loss of memory or consciousness   Past  Medical History:  She,  has a past medical history of Chronic kidney disease, Dialysis patient [facility]), History of kidney transplant (2012), Hypertension, Multiple gastric ulcers, and Pneumonia (01/23/2015).   Surgical History:   Past Surgical History:  Procedure Laterality Date   ESOPHAGOGASTRODUODENOSCOPY N/A 04/01/2016   Procedure: ESOPHAGOGASTRODUODENOSCOPY (EGD);  Surgeon: Gatha Mayer, MD;  Location: [facility] ENDOSCOPY;  Service: Endoscopy;  Laterality: N/A;   ESOPHAGOGASTRODUODENOSCOPY (EGD) WITH PROPOFOL N/A 11/20/2016   Procedure: ESOPHAGOGASTRODUODENOSCOPY (EGD) WITH PROPOFOL;  Surgeon: Doran Stabler, MD;  Location: [place];  Service: Endoscopy;  Laterality: N/A;   KIDNEY TRANSPLANT Bilateral 2012     Social History:   reports that she has never smoked. She has never used smokeless tobacco. She reports that she does not drink alcohol and does not use drugs.   Family History:  Her family history includes Cancer in her maternal grandfather; Kidney disease in her father; Lupus in her maternal grandmother. There is no history of Colon cancer, Stomach cancer, Rectal cancer, Esophageal cancer, or Liver cancer.   Allergies Allergies  Allergen Reactions   Tape Itching and Other (See Comments)    Plastic/clear tape tears up the patient's skin   Zosyn [Piperacillin Sod-Tazobactam So] Itching    Has patient had a PCN reaction causing immediate rash,  facial/tongue/throat swelling, SOB or lightheadedness with hypotension: No Has patient had a PCN reaction causing severe rash involving mucus membranes or skin necrosis: No Has patient had a PCN reaction that required hospitalization: No Has patient had a PCN reaction occurring within the last 10 years: No If all of the above answers are "NO", then may proceed with Cephalosporin use.      Home Medications  Prior to Admission medications   Medication Sig Start Date End Date Taking? Authorizing Provider  b complex-vitamin c-folic acid (NEPHRO-VITE) 0.8 MG TABS tablet Take 1 tablet by mouth daily. 10/24/14   [provider]  calcitRIOL (ROCALTROL) 0.25 MCG capsule Take 0.25 mcg by mouth daily.    [provider]  carvedilol (COREG) 25 MG tablet Take 1 tablet (25 mg total) by mouth 2 (two) times daily with a meal. 12/14/20   Virl Axe, MD  Cholecalciferol (VITAMIN D3) 25 MCG (1000 UT) CAPS Take 1,000 Units by mouth in the morning.    [provider]  Cyanocobalamin (VITAMIN B12) 1000 MCG TBCR Take 1,000 mcg by mouth daily.    [provider]  Drospirenone (SLYND) 4 MG TABS Take 1 tablet by mouth daily with breakfast. 12/11/20   Shelly Bombard, MD  fluticasone [facility]) 50 MCG/ACT nasal spray Place 1 spray into both nostrils daily as needed for allergies or rhinitis. 01/07/21   Rehman, Areeg N, DO  MAG-200 200 MG TABS Take 200 mg by mouth 2 (two) times daily. 11/02/20   [provider]  mirtazapine (REMERON) 15 MG tablet Take 15 mg by mouth in the morning and at bedtime. 11/01/20 11/01/21  [provider]  mycophenolate (MYFORTIC) 180 MG EC tablet Take 180 mg by mouth 2 (two) times daily. 01/15/18   [provider]  NIFEdipine (PROCARDIA XL/NIFEDICAL-XL) 90 MG 24 hr tablet Take 1 tablet (90 mg total) by mouth daily. 12/15/20 01/14/21  Virl Axe, MD  predniSONE (DELTASONE) 5 MG tablet Take 5 mg by mouth daily. 01/15/18   [provider]  tinidazole (TINDAMAX) 500 MG tablet Take 2 tablets (1,000 mg total) by mouth daily with breakfast. Patient not taking: Reported on 01/05/2021 12/11/20   Jodi Mourning,  Clenton Pare, MD  TUMS 500 MG chewable tablet Chew 1 tablet by mouth 3 (three) times daily with meals. 11/02/20   [provider]  amLODipine (NORVASC) 10 MG tablet Take 1 tablet (10 mg total) by mouth every evening. Patient not taking: No sig reported 09/13/15 12/14/20  [name], MD  labetalol (NORMODYNE) 200 MG tablet Take 1 tablet (200 mg total) by mouth 2 (two) times daily. Patient not taking: No sig reported 04/02/16 12/14/20  Elgergawy, Silver Huguenin, MD    Sherrilyn Rist, MD [place] [facility] Pager: See Shea Evans

## 2021-05-21 NOTE — Progress Notes (Signed)
HD tx not achieved as expected due to pt complaining of severe headache and nausea. MD made aware and ordered  to stop the HD tx . Run for 3 hours and 2.8 liters off.

## 2021-05-21 NOTE — Consult Note (Signed)
Renal Service Consult Note [facility]  Kerry Cook 05/21/2021 Kerry Blazing, MD Requesting Physician: Dr. Duwayne Heck  Reason for Consult: ESRD pt w/ vol overload HPI: The patient is a 35 y.o. year-old w/ hx of ESRD on HD, hx failed renal transplant, HTN, hx PUD, PNA who missed her last HD on Friday 11/25. Pt presented w/ SOB, cough and fatigue. In ED pt was hypoxic w/ BP 260/140.  She rec'd Rx w/ bipap, NTG gtt, temporizing measures for high K+ (ins/ dextrose, alb, lokelma, Ca). CXR showed edema. Nephrology was called and pt rec'd 3 hrs of HD overnight w/ total UF of 2.8 L net.  Had to come off early due to high heart rates.  We are asked to see for consultation.   Pt seen in ICU bed.  SOB better, on nasal cannula, no further distress. No other c/os.  RN is weaning down her Cleviprex drip.     ROS - denies CP, no joint pain, no HA, no blurry vision, no rash, no diarrhea, no nausea/ vomiting, no dysuria, no difficulty voiding   Past Medical History  Past Medical History:  Diagnosis Date   Chronic kidney disease    Dialysis patient [facility])    History of kidney transplant 2012   kidney failure due to hypertension   Hypertension    Multiple gastric ulcers    Pneumonia 01/23/2015   Past Surgical History  Past Surgical History:  Procedure Laterality Date   ESOPHAGOGASTRODUODENOSCOPY N/A 04/01/2016   Procedure: ESOPHAGOGASTRODUODENOSCOPY (EGD);  Surgeon: Gatha Mayer, MD;  [facility] END[facility];  Service: Endoscopy;  Laterality: N/A;   ESOPHAGOGASTRODUODENOSCOPY (EGD) WITH PROPOFOL N/A 11/20/2016   Procedure: ESOPHAGOGASTRODUODENOSCOPY (EGD) WITH PROPOFOL;  Surgeon: Doran Stabler, MD;  Location: [place];  Service: Endoscopy;  Laterality: N/A;   KIDNEY TRANSPLANT Bilateral 2012   Family History  Family History  Problem Relation Age of Onset   Kidney disease Father    Lupus Maternal Grandmother    Cancer Maternal Grandfather    Colon cancer Neg Hx     Stomach cancer Neg Hx    Rectal cancer Neg Hx    Esophageal cancer Neg Hx    Liver cancer Neg Hx    Social History  reports that she has never smoked. She has never used smokeless tobacco. She reports that she does not drink alcohol and does not use drugs. Allergies  Allergies  Allergen Reactions   Tape Itching and Other (See Comments)    Plastic/clear tape tears up the patient's skin   Zosyn [Piperacillin Sod-Tazobactam So] Itching    Has patient had a PCN reaction causing immediate rash, facial/tongue/throat swelling, SOB or lightheadedness with hypotension: No Has patient had a PCN reaction causing severe rash involving mucus membranes or skin necrosis: No Has patient had a PCN reaction that required hospitalization: No Has patient had a PCN reaction occurring within the last 10 years: No If all of the above answers are "NO", then may proceed with Cephalosporin use.    Home medications Prior to Admission medications   Medication Sig Start Date End Date Taking? Authorizing Provider  b complex-vitamin c-folic acid (NEPHRO-VITE) 0.8 MG TABS tablet Take 1 tablet by mouth daily. 10/24/14  Yes [provider]  calcitRIOL (ROCALTROL) 0.25 MCG capsule Take 0.25 mcg by mouth daily.   Yes [provider]  carvedilol (COREG) 25 MG tablet Take 1 tablet (25 mg total) by mouth 2 (two) times daily with a meal. 12/14/20  Yes Virl Axe, MD  Cholecalciferol (VITAMIN D3) 25 MCG (1000 UT) CAPS Take 1,000 Units by mouth in the morning.   Yes [provider]  COZAAR 50 MG tablet Take 50 mg by mouth at bedtime. 02/14/21  Yes [provider]  Cyanocobalamin (VITAMIN B12) 1000 MCG TBCR Take 1,000 mcg by mouth daily.   Yes [provider]  Drospirenone (SLYND) 4 MG TABS Take 1 tablet by mouth daily with breakfast. 12/11/20  Yes Shelly Bombard, MD  fluticasone [facility]) 50 MCG/ACT nasal spray Place 1 spray into both nostrils daily as needed for allergies or  rhinitis. 01/07/21  Yes Rehman, Areeg N, DO  labetalol (NORMODYNE) 200 MG tablet Take 200 mg by mouth 2 (two) times daily. 03/28/21  Yes [provider]  mirtazapine (REMERON) 15 MG tablet Take 15 mg by mouth in the morning and at bedtime. 11/01/20 11/01/21 Yes [provider]  NIFEdipine (PROCARDIA XL/NIFEDICAL-XL) 90 MG 24 hr tablet Take 1 tablet (90 mg total) by mouth daily. 12/15/20 05/21/21 Yes Virl Axe, MD  TUMS 500 MG chewable tablet Chew 1 tablet by mouth 3 (three) times daily with meals. 11/02/20  Yes [provider]  amLODipine (NORVASC) 10 MG tablet Take 1 tablet (10 mg total) by mouth every evening. Patient not taking: No sig reported 09/13/15 12/14/20  [name], MD     Vitals:   05/21/21 1300 05/21/21 1315 05/21/21 1330 05/21/21 1345  BP: (!) 165/105 (!) 168/112 (!) 164/97 (!) 155/93  Pulse: 86 82 90 89  Resp: (!) 22 16 (!) 22 20  Temp:      TempSrc:      SpO2: 96% 100% 100% 100%  Weight:      Height:       Exam Gen alert, no distress No rash, cyanosis or gangrene Sclera anicteric, throat clear  No jvd or bruits Chest occ crackles but mostly clear bilat to bases RRR no MRG Abd soft ntnd no mass or ascites +bs GU defer MS no joint effusions or deformity Ext faint LE edema, no wounds or ulcers Neuro is alert, Ox 3 , nf      Home meds include - coreg 25 bid, cozaar 50, labetalol 200 bid, procardia sl 90, tums ac tid, remeron, vits/ supps/ prns     OP HD: AF MWF   3h 52min  400/500  60kg  2K/3Ca bath  Hep 3500    - hect 9 ug tiw   - mircera 150 q2 last 11/22, due 12/06     Assessment/ Plan: Resp distress/ vol overload / pulm edema - improved sp HD early this am. Off bipap now on [place] and comfortable. Will get stand weight and repeat CXR. Some of this may be flash pulm edema as well due to seriously high BP's (240/ 130- 140's). Will follow.  ESRD - usual HD is MWF.  Pt missed Friday. Had HD early this am. Next HD 11/30 unless  has further problems. Get stand wt post HD today.  Uncont HTN - sp home meds and IV cleviprex gtt, being weaned off now.  Anemia ckd - next esa due on 12/06 MBD ckd - cont binders, Ca wnl Hx failed renal transplant - is off tx meds now as of 2-3 mos ago       Kelly Splinter  MD 05/21/2021, 2:26 PM  Recent Labs  Lab 05/21/21 0238 05/21/21 0500  WBC 7.3 5.6  HGB 9.4* 9.6*   Recent Labs  Lab 05/21/21 0237  05/21/21 0500 05/21/21 1058  K 5.2* 3.1* 5.0  BUN 104* 30* 37*  CREATININE 20.74* 8.21* 11.08*  CALCIUM 7.9* 8.4* 8.3*  PHOS 7.5*  --   --

## 2021-05-21 NOTE — Progress Notes (Addendum)
[facility] Progress Note Patient Name: Kerry Cook DOB: April 27, 1986 MRN: [id number]   Date of Service  05/21/2021  HPI/Events of Note  Pt with HTN crisis after missing HD.  On a nitro gtt.  Has a headache.  eICU Interventions  HD is getting started at time of this note (approx 0130hrs).  PRN APAP, PRN toradol x 1 for headache   Addendum:  Toradol helped temporarily.  Will re-order it and change from nitro gtt to cleviprex due to headaches.  Goal SBP < 170.  Will get stat CT head as well.     Intervention Category Evaluation Type: New Patient Evaluation  Tilden Dome 05/21/2021, 1:28 AM

## 2021-05-21 NOTE — ED Notes (Signed)
Contacted 35M to initiate purple man

## 2021-05-21 NOTE — ED Notes (Signed)
Attempted to get lab work but unsuccessful.

## 2021-05-22 ENCOUNTER — Encounter (HOSPITAL_COMMUNITY): Payer: Self-pay | Admitting: Pulmonary Disease

## 2021-05-22 DIAGNOSIS — I161 Hypertensive emergency: Secondary | ICD-10-CM | POA: Diagnosis not present

## 2021-05-22 LAB — HEPATITIS B SURFACE ANTIBODY, QUANTITATIVE: Hep B S AB Quant (Post): 267.4 m[IU]/mL (ref >9.9)

## 2021-05-22 LAB — TRIGLYCERIDES: Triglycerides: 66 mg/dL (ref <150)

## 2021-05-22 LAB — GLUCOSE, CAPILLARY
Glucose-Capillary: 85 mg/dL (ref 70–99)
Glucose-Capillary: 87 mg/dL (ref 70–99)

## 2021-05-22 MED ORDER — PANTOPRAZOLE SODIUM 40 MG PO TBEC
40.0000 mg | DELAYED_RELEASE_TABLET | Freq: Every day | ORAL | Status: DC
  Administered 2021-05-22 – 2021-05-23 (×2): 40 mg via ORAL
  Filled 2021-05-22: qty 1

## 2021-05-22 MED ORDER — PANTOPRAZOLE SODIUM 40 MG PO TBEC
DELAYED_RELEASE_TABLET | ORAL | Status: AC
  Filled 2021-05-22: qty 1

## 2021-05-22 MED ORDER — LABETALOL HCL 5 MG/ML IV SOLN
INTRAVENOUS | Status: AC
  Filled 2021-05-22: qty 4

## 2021-05-22 MED ORDER — LOSARTAN POTASSIUM 50 MG PO TABS
50.0000 mg | ORAL_TABLET | Freq: Every day | ORAL | Status: DC
  Administered 2021-05-22: 50 mg via ORAL
  Filled 2021-05-22 (×2): qty 1

## 2021-05-22 MED ORDER — MIRTAZAPINE 15 MG PO TABS
30.0000 mg | ORAL_TABLET | Freq: Every day | ORAL | Status: DC
  Administered 2021-05-22 – 2021-05-23 (×2): 30 mg via ORAL
  Filled 2021-05-22 (×2): qty 2

## 2021-05-22 MED ORDER — LABETALOL HCL 5 MG/ML IV SOLN
20.0000 mg | INTRAVENOUS | Status: DC | PRN
  Administered 2021-05-22 – 2021-05-24 (×6): 20 mg via INTRAVENOUS
  Filled 2021-05-22 (×6): qty 4

## 2021-05-22 MED ORDER — KETOROLAC TROMETHAMINE 15 MG/ML IJ SOLN
15.0000 mg | Freq: Once | INTRAMUSCULAR | Status: AC
  Administered 2021-05-22: 15 mg via INTRAVENOUS
  Filled 2021-05-22: qty 1

## 2021-05-22 MED ORDER — LOSARTAN POTASSIUM 50 MG PO TABS: 50.0000 mg | ORAL_TABLET | Freq: Every day | ORAL | Status: DC

## 2021-05-22 MED ORDER — MIRTAZAPINE 15 MG PO TABS
ORAL_TABLET | ORAL | Status: AC
  Filled 2021-05-22: qty 2

## 2021-05-22 MED ORDER — LABETALOL HCL 200 MG PO TABS
200.0000 mg | ORAL_TABLET | Freq: Two times a day (BID) | ORAL | Status: DC
  Administered 2021-05-22: 200 mg via ORAL
  Filled 2021-05-22 (×3): qty 1

## 2021-05-22 NOTE — Plan of Care (Signed)

## 2021-05-22 NOTE — Progress Notes (Signed)
[facility] Progress Note  Subjective: off Cleviprex now, pt feelign much better overall  Vitals:   05/22/21 0900 05/22/21 1000 05/22/21 1100 05/22/21 1113  BP: (!) 157/103 (!) 163/108 (!) 144/91 132/84  Pulse: 84 90 73 77  Resp: (!) 22 15 14 20   Temp:    98.7 F (37.1 C)  TempSrc:    Oral  SpO2: 100% 98% 98% 97%  Weight:      Height:        Exam:  alert, nad   no jvd  Chest cta bilat  Cor reg no RG  Abd soft ntnd no ascites   Ext no LE edema   Alert, NF, ox3   LFA AVF +bruit        Home meds include - coreg 25 bid, cozaar 50, labetalol 200 bid, procardia sl 90, tums ac tid, remeron, vits/ supps/ prns        OP HD: AF MWF   3h 36min  400/500  60kg  2K/3Ca bath  Hep 3500  LFA AVF  - hect 9 ug tiw   - mircera 150 q2 last 11/22, due 12/06         Assessment/ Plan: Resp distress/ vol overload / pulm edema - improved sp HD early this am. Off bipap now on [place] and comfortable. Will get stand weight and repeat CXR. May have been component of flash pulm edema as well due to severe HTN (240/ 130- 140's). F/U CXR 11/28 shows resolved edema.  ESRD - usual HD is MWF.  Pt missed Friday. Had HD early Monday am. Next HD Wed.  Uncont HTN - getting home meds. IV cleviprex gtt is off now, BP's better.   Anemia ckd - next esa due on 12/06 MBD ckd - cont binders, Ca wnl Hx failed renal transplant - is off tx meds now as of 2-3 mos ago  Dispo - stable for d/c from renal standpoint      Rob Jondavid Schreier 05/22/2021, 11:44 AM   Recent Labs  Lab 05/21/21 0237 05/21/21 0238 05/21/21 0500 05/21/21 1058  K 5.2*  --  3.1* 5.0  BUN 104*  --  30* 37*  CREATININE 20.74*  --  8.21* 11.08*  CALCIUM 7.9*  --  8.4* 8.3*  PHOS 7.5*  --   --   --   HGB  --  9.4* 9.6*  --    Inpatient medications:  carvedilol  25 mg Oral BID WC   Chlorhexidine Gluconate Cloth  6 each Topical Q0600   heparin  5,000 Units Subcutaneous Q8H   losartan  50 mg Oral Daily   mouth rinse  15 mL  Mouth Rinse BID   NIFEdipine  90 mg Oral Daily   pantoprazole (PROTONIX) IV  40 mg Intravenous QHS    sodium chloride     sodium chloride     sodium chloride, sodium chloride, acetaminophen, alteplase, docusate sodium, heparin, lidocaine (PF), lidocaine-prilocaine, ondansetron (ZOFRAN) IV, pentafluoroprop-tetrafluoroeth, polyethylene glycol

## 2021-05-22 NOTE — Progress Notes (Signed)
NAME:  Kerry Cook, MRN:  [id number], DOB:  [birth year], LOS: 2 ADMISSION DATE:  05/20/2021, CONSULTATION DATE:  11/27 REFERRING MD:  Vernia Buff, CHIEF COMPLAINT:  respiratory distress   History of Present Illness:    Kerry Cook, is a 36 y.o. female, who presented to the [facility] ED with a chief complaint of respiratory disstress  They have a pertinent past medical history of CKD on HD (MWF), kidney transplant (patient unsure of date, reports being off anti rejection drugs for 2-3 months), HTN, gastric ulcers.   She reportedly missed HD on Friday 11/25. She reports developing SOB, cough, weakness, fatigue, and diarrhea on 11/26. On 11/27 her SOB worsened. EMS was called.   ED course was notable for hypoxia in the 80's on arrival and a BP of 260/140. She was started on BiPAP and a nitroglycerin gtt. K was found to be 7. She was temporized withdextrose/insulin, albuterol, lokelma, calcium in ER. CXR concerning for pulmonary edema. No neuro deficits. Reports being compliant with antihypertensives. Denies illicit drug use including cocaine. Reports being anuric.  Nephrology was consulted with plans for urgent HD inpatient.  PCCM was consulted for admission  Pertinent  Medical History  CKD on HD (MWF), HTN, gastric ulcers.   Significant Hospital Events: Including procedures, antibiotic start and stop dates in addition to other pertinent events   11/27 presented to [facility]. BiPAP, nitro gtt 11/28 off nitro, off BiPAP, on Cleviprex denies chest pain 11/29 off Cleviprex off cleviprex   Interim History / Subjective:   Denies chest pain Denies shortness of breath  Objective   Blood pressure (!) 146/91, pulse 76, temperature 98.3 F (36.8 C), temperature source Oral, resp. rate 10, height 5' (1.524 m), weight 60.7 kg, last menstrual period 04/24/2021, SpO2 94 %.        Intake/Output Summary (Last 24 hours) at 05/22/2021 0846 Last data filed at 05/22/2021 0800 Gross per 24 hour   Intake 997.58 ml  Output --  Net 997.58 ml   Filed Weights   05/21/21 0145 05/21/21 1452 05/22/21 0352  Weight: 59 kg 61.5 kg 60.7 kg    Examination: General: Young lady, comfortable HEENT: Moist oral mucosa Neuro: Alert and oriented, moving all extremities CV: S1-S2 appreciated with no murmur PULM: Clear breath sounds bilaterally GI: Bowel sounds appreciated Extremities: No edema Skin:  no rashes or lesions noted, L fistula with bruit and thrill   Resolved Hospital Problem list     Assessment & Plan:   Hypertensive emergency -Improved post dialysis -Cleviprex weaned off -Oral medications resumed including Coreg, Cozaar started today  Acute respiratory failure -Most likely secondary to fluid overload, pulmonary edema -Continue oxygen supplementation -Wean off oxygen segmentation as tolerated  End-stage renal disease on hemodialysis History of kidney transplant -Off antirejection medications for 2 to 3 months, was on Myfortic, prednisone  Anemia -Chronic, related to chronic kidney disease  Beta hCG elevation -Needs to be followed -LMP-11/1  History of gastric ulcers -PPI  Will transfer to [place] (right click and "Reselect all SmartList Selections" daily)   Diet/type: NPO w/ oral meds DVT prophylaxis: prophylactic heparin  GI prophylaxis: PPI Lines: N/A Foley:  N/A Code Status:  full code Last date of multidisciplinary goals of care discussion [pending]  Labs   CBC: Recent Labs  Lab 05/20/21 1949 05/20/21 2058 05/20/21 2153 05/21/21 0238 05/21/21 0500  WBC  --  8.7  --  7.3 5.6  NEUTROABS  --  6.4  --   --   --  HGB 10.2* 10.3* 10.5* 9.4* 9.6*  HCT 30.0* 32.1* 31.0* 28.8* 29.5*  MCV  --  92.8  --  92.3 90.5  PLT  --  158  --  164 476    Basic Metabolic Panel: Recent Labs  Lab 05/20/21 2058 05/20/21 2153 05/21/21 0237 05/21/21 0238 05/21/21 0500 05/21/21 1058  NA 133* 132* 135  --  134* 133*  K >7.5* 7.0* 5.2*  --   3.1* 5.0  CL 100 106 101  --  99 99  CO2 17*  --  18*  --  24 24  GLUCOSE 58* 51* 77  --  90 79  BUN 100* 103* 104*  --  30* 37*  CREATININE 20.36* >18.00* 20.74*  --  8.21* 11.08*  CALCIUM 7.7*  --  7.9*  --  8.4* 8.3*  MG  --   --   --  2.7* 2.3  --   PHOS  --   --  7.5*  --   --   --    GFR: Estimated Creatinine Clearance: 5.8 mL/min (A) (by C-G formula based on SCr of 11.08 mg/dL (H)). Recent Labs  Lab 05/20/21 2058 05/21/21 0238 05/21/21 0500  WBC 8.7 7.3 5.6    Liver Function Tests: Recent Labs  Lab 05/20/21 2058 05/21/21 0237 05/21/21 0500  AST 17  --  18  ALT 11  --  11  ALKPHOS 71  --  67  BILITOT 0.7  --  0.7  PROT 7.4  --  7.2  ALBUMIN 3.3* 2.9* 3.1*   No results for input(s): LIPASE, AMYLASE in the last 168 hours. No results for input(s): AMMONIA in the last 168 hours.  ABG    Component Value Date/Time   PHART 7.426 05/20/2021 1949   PCO2ART 26.7 (L) 05/20/2021 1949   PO2ART 119 (H) 05/20/2021 1949   HCO3 17.6 (L) 05/20/2021 1949   TCO2 18 (L) 05/20/2021 2153   ACIDBASEDEF 6.0 (H) 05/20/2021 1949   O2SAT 99.0 05/20/2021 1949     Coagulation Profile: No results for input(s): INR, PROTIME in the last 168 hours.  Cardiac Enzymes: No results for input(s): CKTOTAL, CKMB, CKMBINDEX, TROPONINI in the last 168 hours.  HbA1C: Hgb A1c MFr Bld  Date/Time Value Ref Range Status  12/11/2020 01:55 PM 4.8 4.8 - 5.6 % Final    Comment:             Prediabetes: 5.7 - 6.4          Diabetes: >6.4          Glycemic control for adults with diabetes: <7.0     CBG: Recent Labs  Lab 05/21/21 1113 05/21/21 1545 05/21/21 2021 05/22/21 0037 05/22/21 0356  GLUCAP 79 145* 119* 87 85    Review of Systems:   Positives in bold  Gen: fever, chills, weight change, fatigue, night sweats HEENT:  blurred vision, double vision, hearing loss, tinnitus, sinus congestion, rhinorrhea, sore throat, neck stiffness, dysphagia PULM:  shortness of breath, cough, sputum  production, hemoptysis, wheezing CV: chest pain, edema, orthopnea, paroxysmal nocturnal dyspnea, palpitations GI:  abdominal pain, nausea, vomiting, diarrhea, hematochezia, melena, constipation, change in bowel habits GU: dysuria, hematuria, polyuria, oliguria, urethral discharge Endocrine: hot or cold intolerance, polyuria, polyphagia or appetite change Derm: rash, dry skin, scaling or peeling skin change Heme: easy bruising, bleeding, bleeding gums Neuro: headache, numbness, weakness, slurred speech, loss of memory or consciousness   Past Medical History:  She,  has a past medical history of  Chronic kidney disease, Dialysis patient [facility]), History of kidney transplant (2012), Hypertension, Multiple gastric ulcers, and Pneumonia (01/23/2015).   Surgical History:   Past Surgical History:  Procedure Laterality Date   ESOPHAGOGASTRODUODENOSCOPY N/A 04/01/2016   Procedure: ESOPHAGOGASTRODUODENOSCOPY (EGD);  Surgeon: Gatha Mayer, MD;  [facility] ENDOSCOPY;  Service: Endoscopy;  Laterality: N/A;   ESOPHAGOGASTRODUODENOSCOPY (EGD) WITH PROPOFOL N/A 11/20/2016   Procedure: ESOPHAGOGASTRODUODENOSCOPY (EGD) WITH PROPOFOL;  Surgeon: Doran Stabler, MD;  Location: [place];  Service: Endoscopy;  Laterality: N/A;   KIDNEY TRANSPLANT Bilateral 2012     Social History:   reports that she has never smoked. She has never used smokeless tobacco. She reports that she does not drink alcohol and does not use drugs.   Family History:  Her family history includes Cancer in her maternal grandfather; Kidney disease in her father; Lupus in her maternal grandmother. There is no history of Colon cancer, Stomach cancer, Rectal cancer, Esophageal cancer, or Liver cancer.   Allergies Allergies  Allergen Reactions   Tape Itching and Other (See Comments)    Plastic/clear tape tears up the patient's skin   Zosyn [Piperacillin Sod-Tazobactam So] Itching    Has patient had a PCN reaction causing immediate  rash, facial/tongue/throat swelling, SOB or lightheadedness with hypotension: No Has patient had a PCN reaction causing severe rash involving mucus membranes or skin necrosis: No Has patient had a PCN reaction that required hospitalization: No Has patient had a PCN reaction occurring within the last 10 years: No If all of the above answers are "NO", then may proceed with Cephalosporin use.      Home Medications  Prior to Admission medications   Medication Sig Start Date End Date Taking? Authorizing Provider  b complex-vitamin c-folic acid (NEPHRO-VITE) 0.8 MG TABS tablet Take 1 tablet by mouth daily. 10/24/14   [provider]  calcitRIOL (ROCALTROL) 0.25 MCG capsule Take 0.25 mcg by mouth daily.    [provider]  carvedilol (COREG) 25 MG tablet Take 1 tablet (25 mg total) by mouth 2 (two) times daily with a meal. 12/14/20   Virl Axe, MD  Cholecalciferol (VITAMIN D3) 25 MCG (1000 UT) CAPS Take 1,000 Units by mouth in the morning.    [provider]  Cyanocobalamin (VITAMIN B12) 1000 MCG TBCR Take 1,000 mcg by mouth daily.    [provider]  Drospirenone (SLYND) 4 MG TABS Take 1 tablet by mouth daily with breakfast. 12/11/20   Shelly Bombard, MD  fluticasone [facility]) 50 MCG/ACT nasal spray Place 1 spray into both nostrils daily as needed for allergies or rhinitis. 01/07/21   Rehman, Areeg N, DO  MAG-200 200 MG TABS Take 200 mg by mouth 2 (two) times daily. 11/02/20   [provider]  mirtazapine (REMERON) 15 MG tablet Take 15 mg by mouth in the morning and at bedtime. 11/01/20 11/01/21  [provider]  mycophenolate (MYFORTIC) 180 MG EC tablet Take 180 mg by mouth 2 (two) times daily. 01/15/18   [provider]  NIFEdipine (PROCARDIA XL/NIFEDICAL-XL) 90 MG 24 hr tablet Take 1 tablet (90 mg total) by mouth daily. 12/15/20 01/14/21  Virl Axe, MD  predniSONE (DELTASONE) 5 MG tablet Take 5 mg by mouth daily. 01/15/18   [provider]  tinidazole (TINDAMAX) 500 MG tablet Take 2 tablets (1,000 mg total) by mouth daily with breakfast. Patient not taking: Reported on 01/05/2021 12/11/20   Shelly Bombard, MD  TUMS 500 MG chewable tablet Chew 1  tablet by mouth 3 (three) times daily with meals. 11/02/20   [provider]  amLODipine (NORVASC) 10 MG tablet Take 1 tablet (10 mg total) by mouth every evening. Patient not taking: No sig reported 09/13/15 12/14/20  [name], MD  labetalol (NORMODYNE) 200 MG tablet Take 1 tablet (200 mg total) by mouth 2 (two) times daily. Patient not taking: No sig reported 04/02/16 12/14/20  Elgergawy, Silver Huguenin, MD    Sherrilyn Rist, MD [place] PCCM Pager: See Shea Evans

## 2021-05-23 DIAGNOSIS — I161 Hypertensive emergency: Secondary | ICD-10-CM | POA: Diagnosis not present

## 2021-05-23 LAB — GLUCOSE, CAPILLARY
Glucose-Capillary: 69 mg/dL — ABNORMAL LOW (ref 70–99)
Glucose-Capillary: 74 mg/dL (ref 70–99)
Glucose-Capillary: 81 mg/dL (ref 70–99)
Glucose-Capillary: 121 mg/dL — ABNORMAL HIGH (ref 70–99)

## 2021-05-23 MED ORDER — LABETALOL HCL 200 MG PO TABS
300.0000 mg | ORAL_TABLET | Freq: Two times a day (BID) | ORAL | Status: DC
  Administered 2021-05-23 – 2021-05-24 (×3): 300 mg via ORAL
  Filled 2021-05-23 (×3): qty 1

## 2021-05-23 MED ORDER — HEPARIN SODIUM (PORCINE) 1000 UNIT/ML IJ SOLN
INTRAMUSCULAR | Status: AC
  Filled 2021-05-23: qty 4

## 2021-05-23 MED ORDER — CHLORHEXIDINE GLUCONATE CLOTH 2 % EX PADS
6.0000 | MEDICATED_PAD | Freq: Every day | CUTANEOUS | Status: DC
  Administered 2021-05-24: 6 via TOPICAL

## 2021-05-23 MED ORDER — LOSARTAN POTASSIUM 50 MG PO TABS
50.0000 mg | ORAL_TABLET | Freq: Two times a day (BID) | ORAL | Status: DC
  Administered 2021-05-23 – 2021-05-24 (×3): 50 mg via ORAL
  Filled 2021-05-23 (×3): qty 1

## 2021-05-23 NOTE — Progress Notes (Signed)
Pt receives out-pt HD at [facility] on MWF. Pt arrives around 5:45 for 6:00 chair time. Contacted attending this am to inquire about possible d/c date in an attempt to avoid HD on day of d/c. Pt is not stable for d/c today due blood pressure. Will assist as needed.   Melven Sartorius Renal Navigator [phone]

## 2021-05-23 NOTE — Progress Notes (Signed)
Hypoglycemic Event  CBG: 69  Treatment: 4 oz juice/soda  Symptoms:  fatigue  Follow-up CBG: Time:1244 CBG Result:81  Possible Reasons for Event: Inadequate meal intake    Kerry Cook

## 2021-05-23 NOTE — Progress Notes (Addendum)
Kerry Cook Progress Note  Subjective: Kerry Cook in the 100- 105 range, asymptomatic  Vitals:   05/23/21 0806 05/23/21 0900 05/23/21 0947 05/23/21 1000  BP:  (!) 187/105 (!) 170/101 (!) 172/112  Pulse:   88 84  Resp:  15 20 (!) 27  Temp: 98.6 F (37 C)     TempSrc: Oral     SpO2:   96% 90%  Weight:      Height:        Exam:  alert, nad   no jvd  Chest cta bilat  Cor reg no RG  Abd soft ntnd no ascites   Ext no LE edema   Alert, NF, ox3   Kerry Cook +bruit        Home meds include - coreg 25 bid, cozaar 50, labetalol 200 bid, procardia sl 90, tums ac tid, remeron, vits/ supps/ prns        OP HD: AF MWF   3h 43min  400/500  60kg  2K/3Ca bath  Hep 3500  Kerry Cook  - hect 9 ug tiw   - mircera 150 q2 last 11/22, due 12/06         Assessment/ Plan: Resp distress/ pulm edema - improved w/ inpatient HD 11/28 overnight. May have been component of flash pulm edema as well due to severe HTN (240/ 130- 140's). F/U CXR 11/28 shows resolved edema.  ESRD - usual HD is MWF.  Pt missed Friday. Had HD here early 11/28 am. HD today on schedule.  Uncont HTN/ volume - getting home meds, sp cleviprex gtt. BP's remain borderline but this is typical for her. Looking at OP HD data, recent pre-HD BPs are 180-230/ 120's, post HD on average are in the 160's/ 90's. We will continue to work on her BP in OP setting. Will possibly try to lower her dry wt w/ HD today.  Anemia ckd - next esa due on 12/06 MBD ckd - cont binders, Ca wnl Hx failed renal transplant - is off tx meds now as of 2-3 mos ago  Dispo - stable for d/c from renal standpoint post HD today   Kerry Cook 05/23/2021, 11:00 AM   Recent Labs  Lab 05/21/21 0237 05/21/21 0238 05/21/21 0500 05/21/21 1058  K 5.2*  --  3.1* 5.0  BUN 104*  --  30* 37*  CREATININE 20.74*  --  8.21* 11.08*  CALCIUM 7.9*  --  8.4* 8.3*  PHOS 7.5*  --   --   --   HGB  --  9.4* 9.6*  --     Inpatient medications:  Chlorhexidine Gluconate  Cloth  6 each Topical Daily   heparin  5,000 Units Subcutaneous Q8H   labetalol  300 mg Oral BID   losartan  50 mg Oral BID   mirtazapine  30 mg Oral QHS   NIFEdipine  90 mg Oral Daily   pantoprazole  40 mg Oral QHS     acetaminophen, docusate sodium, labetalol, ondansetron (ZOFRAN) IV, polyethylene glycol

## 2021-05-23 NOTE — Progress Notes (Signed)
Patient transferred from 39M to [phone]. Report received from [place], [place]. Cell phone and clothing sent with patient. Patient connected to telemetry. VSS. Denies any complaints. Room and unit orientation completed. Call bell within reach. Will continue to monitor.

## 2021-05-23 NOTE — Progress Notes (Signed)
NAME:  Kerry Cook, MRN:  [id number], DOB:  12/13/1985, LOS: 3 ADMISSION DATE:  05/20/2021, CONSULTATION DATE:  11/27 REFERRING MD:  Vernia Buff, CHIEF COMPLAINT:  respiratory distress   History of Present Illness:    Kerry Cook, is a 35 y.o. female, who presented to the [facility] ED with a chief complaint of respiratory disstress  They have a pertinent past medical history of CKD on HD (MWF), kidney transplant (patient unsure of date, reports being off anti rejection drugs for 2-3 months), HTN, gastric ulcers.   She reportedly missed HD on Friday 11/25. She reports developing SOB, cough, weakness, fatigue, and diarrhea on 11/26. On 11/27 her SOB worsened. EMS was called.   ED course was notable for hypoxia in the 80's on arrival and a BP of 260/140. She was started on BiPAP and a nitroglycerin gtt. K was found to be 7. She was temporized withdextrose/insulin, albuterol, lokelma, calcium in ER. CXR concerning for pulmonary edema. No neuro deficits. Reports being compliant with antihypertensives. Denies illicit drug use including cocaine. Reports being anuric.  Nephrology was consulted with plans for urgent HD inpatient.  PCCM was consulted for admission  Pertinent  Medical History  CKD on HD (MWF), HTN, gastric ulcers.   Significant Hospital Events: Including procedures, antibiotic start and stop dates in addition to other pertinent events   11/27 presented to [facility]. BiPAP, nitro gtt 11/28 off nitro, off BiPAP, on Cleviprex denies chest pain 11/29 off Cleviprex off cleviprex  11/30 Remains off Cleviprex at present   Interim History / Subjective:   No chest pain No shortness of breath  Objective   Blood pressure (!) 173/104, pulse 98, temperature 98.6 F (37 C), temperature source Oral, resp. rate 16, height 5' (1.524 m), weight 63.3 kg, last menstrual period 04/24/2021, SpO2 94 %.        Intake/Output Summary (Last 24 hours) at 05/23/2021 0915 Last data filed at  05/22/2021 2000 Gross per 24 hour  Intake 833.21 ml  Output 0 ml  Net 833.21 ml   Filed Weights   05/21/21 1452 05/22/21 0352 05/23/21 0500  Weight: 61.5 kg 60.7 kg 63.3 kg    Examination: General: Young lady, comfortable HEENT: Moist oral mucosa Neuro: Alert and oriented, moving all extremities CV: S1-S2 appreciated, no murmur PULM: Clear breath sounds bilaterally GI: Bowel sounds appreciated Extremities: No edema Skin:  no rashes or lesions noted, L fistula with bruit and thrill   Resolved Hospital Problem list     Assessment & Plan:   Hypertensive emergency -Blood pressure did improve post dialysis -Still with significantly high blood pressures -Off Cleviprex -Medications been optimized -Increased labetalol to 300 twice daily, increased cozaar to 50 twice daily, on Procardia XL 90  Acute respiratory failure -Secondary to fluid overload, pulmonary edema -Resolved  End-stage renal disease on hemodialysis History of kidney transplant -Off antirejection medication for 2 to 3 months  Anemia -Related to chronic kidney disease  Beta hCG elevation -Needs to be followed -Last LMP 11/1, patient aware  History of gastric ulcers -On PPI  Transfer to progressive floor today  Concern is blood pressure is still high in a patient who appears to be noncompliant -Attempting to optimize antihypertensives with less frequent dosing   Best Practice (right click and "Reselect all SmartList Selections" daily)   Diet/type: NPO w/ oral meds DVT prophylaxis: prophylactic heparin  GI prophylaxis: PPI Lines: N/A Foley:  N/A Code Status:  full code Last date of multidisciplinary goals of care discussion [  pending]  Labs   CBC: Recent Labs  Lab 05/20/21 1949 05/20/21 2058 05/20/21 2153 05/21/21 0238 05/21/21 0500  WBC  --  8.7  --  7.3 5.6  NEUTROABS  --  6.4  --   --   --   HGB 10.2* 10.3* 10.5* 9.4* 9.6*  HCT 30.0* 32.1* 31.0* 28.8* 29.5*  MCV  --  92.8  --  92.3  90.5  PLT  --  158  --  164 732    Basic Metabolic Panel: Recent Labs  Lab 05/20/21 2058 05/20/21 2153 05/21/21 0237 05/21/21 0238 05/21/21 0500 05/21/21 1058  NA 133* 132* 135  --  134* 133*  K >7.5* 7.0* 5.2*  --  3.1* 5.0  CL 100 106 101  --  99 99  CO2 17*  --  18*  --  24 24  GLUCOSE 58* 51* 77  --  90 79  BUN 100* 103* 104*  --  30* 37*  CREATININE 20.36* >18.00* 20.74*  --  8.21* 11.08*  CALCIUM 7.7*  --  7.9*  --  8.4* 8.3*  MG  --   --   --  2.7* 2.3  --   PHOS  --   --  7.5*  --   --   --    GFR: Estimated Creatinine Clearance: 5.9 mL/min (A) (by C-G formula based on SCr of 11.08 mg/dL (H)). Recent Labs  Lab 05/20/21 2058 05/21/21 0238 05/21/21 0500  WBC 8.7 7.3 5.6    Liver Function Tests: Recent Labs  Lab 05/20/21 2058 05/21/21 0237 05/21/21 0500  AST 17  --  18  ALT 11  --  11  ALKPHOS 71  --  67  BILITOT 0.7  --  0.7  PROT 7.4  --  7.2  ALBUMIN 3.3* 2.9* 3.1*   No results for input(s): LIPASE, AMYLASE in the last 168 hours. No results for input(s): AMMONIA in the last 168 hours.  ABG    Component Value Date/Time   PHART 7.426 05/20/2021 1949   PCO2ART 26.7 (L) 05/20/2021 1949   PO2ART 119 (H) 05/20/2021 1949   HCO3 17.6 (L) 05/20/2021 1949   TCO2 18 (L) 05/20/2021 2153   ACIDBASEDEF 6.0 (H) 05/20/2021 1949   O2SAT 99.0 05/20/2021 1949     Coagulation Profile: No results for input(s): INR, PROTIME in the last 168 hours.  Cardiac Enzymes: No results for input(s): CKTOTAL, CKMB, CKMBINDEX, TROPONINI in the last 168 hours.  HbA1C: Hgb A1c MFr Bld  Date/Time Value Ref Range Status  12/11/2020 01:55 PM 4.8 4.8 - 5.6 % Final    Comment:             Prediabetes: 5.7 - 6.4          Diabetes: >6.4          Glycemic control for adults with diabetes: <7.0     CBG: Recent Labs  Lab 05/21/21 1113 05/21/21 1545 05/21/21 2021 05/22/21 0037 05/22/21 0356  GLUCAP 79 145* 119* 87 85    Review of Systems:   Positives in bold  Gen:  fever, chills, weight change, fatigue, night sweats HEENT:  blurred vision, double vision, hearing loss, tinnitus, sinus congestion, rhinorrhea, sore throat, neck stiffness, dysphagia PULM:  shortness of breath, cough, sputum production, hemoptysis, wheezing CV: chest pain, edema, orthopnea, paroxysmal nocturnal dyspnea, palpitations GI:  abdominal pain, nausea, vomiting, diarrhea, hematochezia, melena, constipation, change in bowel habits GU: dysuria, hematuria, polyuria, oliguria, urethral discharge Endocrine: hot or  cold intolerance, polyuria, polyphagia or appetite change Derm: rash, dry skin, scaling or peeling skin change Heme: easy bruising, bleeding, bleeding gums Neuro: headache, numbness, weakness, slurred speech, loss of memory or consciousness   Past Medical History:  She,  has a past medical history of ESRD on hemodialysis ([place]), History of kidney transplant, Hypertension, Multiple gastric ulcers, and Pneumonia (01/23/2015).   Surgical History:   Past Surgical History:  Procedure Laterality Date   ESOPHAGOGASTRODUODENOSCOPY N/A 04/01/2016   Procedure: ESOPHAGOGASTRODUODENOSCOPY (EGD);  Surgeon: Gatha Mayer, MD;  Location: [facility] ENDOSCOPY;  Service: Endoscopy;  Laterality: N/A;   ESOPHAGOGASTRODUODENOSCOPY (EGD) WITH PROPOFOL N/A 11/20/2016   Procedure: ESOPHAGOGASTRODUODENOSCOPY (EGD) WITH PROPOFOL;  Surgeon: Doran Stabler, MD;  Location: [place];  Service: Endoscopy;  Laterality: N/A;   KIDNEY TRANSPLANT Bilateral 2012     Social History:   reports that she has never smoked. She has never used smokeless tobacco. She reports that she does not drink alcohol and does not use drugs.   Family History:  Her family history includes Cancer in her maternal grandfather; Kidney disease in her father; Lupus in her maternal grandmother. There is no history of Colon cancer, Stomach cancer, Rectal cancer, Esophageal cancer, or Liver cancer.   Allergies Allergies  Allergen  Reactions   Tape Itching and Other (See Comments)    Plastic/clear tape tears up the patient's skin   Zosyn [Piperacillin Sod-Tazobactam So] Itching    Has patient had a PCN reaction causing immediate rash, facial/tongue/throat swelling, SOB or lightheadedness with hypotension: No Has patient had a PCN reaction causing severe rash involving mucus membranes or skin necrosis: No Has patient had a PCN reaction that required hospitalization: No Has patient had a PCN reaction occurring within the last 10 years: No If all of the above answers are "NO", then may proceed with Cephalosporin use.      Home Medications  Prior to Admission medications   Medication Sig Start Date End Date Taking? Authorizing Provider  b complex-vitamin c-folic acid (NEPHRO-VITE) 0.8 MG TABS tablet Take 1 tablet by mouth daily. 10/24/14   [provider]  calcitRIOL (ROCALTROL) 0.25 MCG capsule Take 0.25 mcg by mouth daily.    [provider]  carvedilol (COREG) 25 MG tablet Take 1 tablet (25 mg total) by mouth 2 (two) times daily with a meal. 12/14/20   Virl Axe, MD  Cholecalciferol (VITAMIN D3) 25 MCG (1000 UT) CAPS Take 1,000 Units by mouth in the morning.    [provider]  Cyanocobalamin (VITAMIN B12) 1000 MCG TBCR Take 1,000 mcg by mouth daily.    [provider]  Drospirenone (SLYND) 4 MG TABS Take 1 tablet by mouth daily with breakfast. 12/11/20   Shelly Bombard, MD  fluticasone [facility]) 50 MCG/ACT nasal spray Place 1 spray into both nostrils daily as needed for allergies or rhinitis. 01/07/21   Rehman, Areeg N, DO  MAG-200 200 MG TABS Take 200 mg by mouth 2 (two) times daily. 11/02/20   [provider]  mirtazapine (REMERON) 15 MG tablet Take 15 mg by mouth in the morning and at bedtime. 11/01/20 11/01/21  [provider]  mycophenolate (MYFORTIC) 180 MG EC tablet Take 180 mg by mouth 2 (two) times daily. 01/15/18   [provider]  NIFEdipine  (PROCARDIA XL/NIFEDICAL-XL) 90 MG 24 hr tablet Take 1 tablet (90 mg total) by mouth daily. 12/15/20 01/14/21  Virl Axe, MD  predniSONE (DELTASONE) 5 MG tablet Take 5 mg by mouth daily.  01/15/18   [provider]  tinidazole (TINDAMAX) 500 MG tablet Take 2 tablets (1,000 mg total) by mouth daily with breakfast. Patient not taking: Reported on 01/05/2021 12/11/20   Shelly Bombard, MD  TUMS 500 MG chewable tablet Chew 1 tablet by mouth 3 (three) times daily with meals. 11/02/20   [provider]  amLODipine (NORVASC) 10 MG tablet Take 1 tablet (10 mg total) by mouth every evening. Patient not taking: No sig reported 09/13/15 12/14/20  [name], MD  labetalol (NORMODYNE) 200 MG tablet Take 1 tablet (200 mg total) by mouth 2 (two) times daily. Patient not taking: No sig reported 04/02/16 12/14/20  Elgergawy, Silver Huguenin, MD    Sherrilyn Rist, MD [place] [facility] Pager: See Shea Evans

## 2021-05-24 DIAGNOSIS — J9601 Acute respiratory failure with hypoxia: Secondary | ICD-10-CM | POA: Diagnosis not present

## 2021-05-24 DIAGNOSIS — I161 Hypertensive emergency: Secondary | ICD-10-CM | POA: Diagnosis not present

## 2021-05-24 DIAGNOSIS — E875 Hyperkalemia: Secondary | ICD-10-CM | POA: Diagnosis not present

## 2021-05-24 DIAGNOSIS — N186 End stage renal disease: Secondary | ICD-10-CM | POA: Diagnosis not present

## 2021-05-24 LAB — GLUCOSE, CAPILLARY: Glucose-Capillary: 85 mg/dL (ref 70–99)

## 2021-05-24 MED ORDER — LOSARTAN POTASSIUM 100 MG PO TABS: 100.0000 mg | ORAL_TABLET | Freq: Every day | ORAL | 11 refills | Status: DC

## 2021-05-24 MED ORDER — LABETALOL HCL 300 MG PO TABS
300.0000 mg | ORAL_TABLET | Freq: Two times a day (BID) | ORAL | 3 refills | Status: DC
Start: 2021-05-24 — End: 2021-09-06

## 2021-05-24 MED ORDER — HYDRALAZINE HCL 20 MG/ML IJ SOLN
10.0000 mg | Freq: Once | INTRAMUSCULAR | Status: AC
  Administered 2021-05-24: 10 mg via INTRAVENOUS
  Filled 2021-05-24: qty 1

## 2021-05-24 MED ORDER — PANTOPRAZOLE SODIUM 40 MG PO TBEC
40.0000 mg | DELAYED_RELEASE_TABLET | Freq: Every day | ORAL | 2 refills | Status: DC
Start: 2021-05-24 — End: 2021-09-06

## 2021-05-24 NOTE — Discharge Summary (Signed)
PATIENT DETAILS Name: Kerry Cook Age: 35 y.o. Sex: female Date of Birth: May 19, 1986 MRN: [id number]. Admitting Physician: Collier Bullock, MD UXN:[id number], No Pcp Per (Inactive)  Admit Date: 05/20/2021 Discharge date: 05/24/2021  Recommendations for Outpatient Follow-up:  Follow up with PCP in 1-2 weeks Please obtain CMP/CBC in one week Please ensure compliance with dialysis and medications.  Admitted From:  Home  Disposition: [place]: No  Equipment/Devices: None  Discharge Condition: Stable  CODE STATUS: FULL CODE  Diet recommendation:  Diet Order             Diet - low sodium heart healthy           Diet renal with fluid restriction Fluid restriction: 1200 mL Fluid; Room service appropriate? Yes; Fluid consistency: Thin  Diet effective now                    Brief Summary: See H&P, Labs, Consult and Test reports for all details in brief, patient is a 35 year old female with history of ESRD (failed renal transplant) HTN-missed hemodialysis on 11/25-presented to the ED on 11/27 with worsening shortness of breath-she was found to have acute hypoxic respiratory failure due to pulmonary edema in the setting of missed hemodialysis, and hypertensive emergency.  She was admitted to the ICU-started on BiPAP and Cleviprex infusion.  Upon stability-she was transferred to the [facility] hospitalist service.  Brief Hospital Course: Hypertensive emergency: Improved with dialysis and Cleviprex infusion.  BP much better and stable-Per nephrology-patient always has elevated blood pressure at baseline.  Dosage of labetalol and Cozaar have been increased-she will continue on her current dosing of Procardia.  Nephrology will continue to adjust her outpatient blood pressure medications and optimize accordingly.    Acute hypoxic respiratory failure due to pulmonary edema in the setting of missed hemodialysis: Resolved with dialysis.  She is stable on room air.  ESRD  on HD: Managed by nephrology-compliance to dialysis has been emphasized.  History of renal transplant-not on any transplant medication for the past 2-3 months.  Hyperkalemia: Due to missed hemodialysis-resolved.  Anemia: Related to CKD-nephrology to continue to manage Aranesp and iron  Elevated beta-hCG: Her last menstrual period was on 11/1-she does not think she is pregnant-she does not want to remain hospitalized for repeat beta-hCG.  I have asked her to inform her primary care practitioner if she does not get her period in the next day or so.  History of gastric ulcers: Continue PPI  Procedures None  Discharge Diagnoses:  Principal Problem:   Hypertensive emergency Active Problems:   Hyperkalemia   Discharge Instructions:  Activity:  As tolerated   Discharge Instructions     Call MD for:  difficulty breathing, headache or visual disturbances   Complete by: As directed    Diet - low sodium heart healthy   Complete by: As directed    Discharge instructions   Complete by: As directed    Follow with Primary MD  in 1-2 weeks  You are beta-hCG (pregnancy hormone) was mildly elevated-if you do not get your menstrual.  In the next few days-you might need to ask your outpatient MD/primary care practitioner to check if you are pregnant.  Please get a complete blood count and chemistry panel checked by your Primary MD at your next visit, and again as instructed by your Primary MD.  Get Medicines reviewed and adjusted: Please take all your medications with you for your next visit with your Primary MD  Laboratory/radiological data: Please request your Primary MD to go over all hospital tests and procedure/radiological results at the follow up, please ask your Primary MD to get all Hospital records sent to his/her office.  In some cases, they will be blood work, cultures and biopsy results pending at the time of your discharge. Please request that your primary care M.D. follows up  on these results.  Also Note the following: If you experience worsening of your admission symptoms, develop shortness of breath, life threatening emergency, suicidal or homicidal thoughts you must seek medical attention immediately by calling 911 or calling your MD immediately  if symptoms less severe.  You must read complete instructions/literature along with all the possible adverse reactions/side effects for all the Medicines you take and that have been prescribed to you. Take any new Medicines after you have completely understood and accpet all the possible adverse reactions/side effects.   Do not drive when taking Pain medications or sleeping medications (Benzodaizepines)  Do not take more than prescribed Pain, Sleep and Anxiety Medications. It is not advisable to combine anxiety,sleep and pain medications without talking with your primary care practitioner  Special Instructions: If you have smoked or chewed Tobacco  in the last 2 yrs please stop smoking, stop any regular Alcohol  and or any Recreational drug use.  Wear Seat belts while driving.  Please note: You were cared for by a hospitalist during your hospital stay. Once you are discharged, your primary care physician will handle any further medical issues. Please note that NO REFILLS for any discharge medications will be authorized once you are discharged, as it is imperative that you return to your primary care physician (or establish a relationship with a primary care physician if you do not have one) for your post hospital discharge needs so that they can reassess your need for medications and monitor your lab values.   Your beta-hCG (pregnancy hormone) was mildly elevated-if you do not get your menstrual.  In the next few days-you might need to ask your outpatient MD/primary care practitioner to check if you are pregnant.   Increase activity slowly   Complete by: As directed       Allergies as of 05/24/2021       Reactions    Tape Itching, Other (See Comments)   Plastic/clear tape tears up the patient's skin   Zosyn [piperacillin Sod-tazobactam So] Itching   Has patient had a PCN reaction causing immediate rash, facial/tongue/throat swelling, SOB or lightheadedness with hypotension: No Has patient had a PCN reaction causing severe rash involving mucus membranes or skin necrosis: No Has patient had a PCN reaction that required hospitalization: No Has patient had a PCN reaction occurring within the last 10 years: No If all of the above answers are "NO", then may proceed with Cephalosporin use.        Medication List     STOP taking these medications    carvedilol 25 MG tablet Commonly known as: COREG       TAKE these medications    b complex-vitamin c-folic acid 0.8 MG Tabs tablet Take 1 tablet by mouth daily.   calcitRIOL 0.25 MCG capsule Commonly known as: ROCALTROL Take 0.25 mcg by mouth daily.   fluticasone 50 MCG/ACT nasal spray Commonly known as: FLONASE Place 1 spray into both nostrils daily as needed for allergies or rhinitis.   labetalol 300 MG tablet Commonly known as: NORMODYNE Take 1 tablet (300 mg total) by mouth 2 (two) times daily. What  changed:  medication strength how much to take   losartan 100 MG tablet Commonly known as: Cozaar Take 1 tablet (100 mg total) by mouth daily. What changed:  medication strength how much to take when to take this   mirtazapine 15 MG tablet Commonly known as: REMERON Take 15 mg by mouth in the morning and at bedtime.   NIFEdipine 90 MG 24 hr tablet Commonly known as: PROCARDIA XL/NIFEDICAL-XL Take 1 tablet (90 mg total) by mouth daily.   pantoprazole 40 MG tablet Commonly known as: PROTONIX Take 1 tablet (40 mg total) by mouth at bedtime.   Slynd 4 MG Tabs Generic drug: Drospirenone Take 1 tablet by mouth daily with breakfast.   Tums 500 MG chewable tablet Generic drug: calcium carbonate Chew 1 tablet by mouth 3 (three) times  daily with meals.   Vitamin B12 1000 MCG Tbcr Take 1,000 mcg by mouth daily.   Vitamin D3 25 MCG (1000 UT) Caps Take 1,000 Units by mouth in the morning.        Allergies  Allergen Reactions   Tape Itching and Other (See Comments)    Plastic/clear tape tears up the patient's skin   Zosyn [Piperacillin Sod-Tazobactam So] Itching    Has patient had a PCN reaction causing immediate rash, facial/tongue/throat swelling, SOB or lightheadedness with hypotension: No Has patient had a PCN reaction causing severe rash involving mucus membranes or skin necrosis: No Has patient had a PCN reaction that required hospitalization: No Has patient had a PCN reaction occurring within the last 10 years: No If all of the above answers are "NO", then may proceed with Cephalosporin use.       Consultations:  pulmonary/intensive care and nephrology   Other Procedures/Studies: DG Chest 2 View  Result Date: 05/21/2021 CLINICAL DATA:  Pulmonary edema.  Follow-up chest radiograph. EXAM: CHEST - 2 VIEW COMPARISON:  Chest radiograph dated 05/20/2021. FINDINGS: Interval improvement in aeration of the lungs with improved vascular congestion and edema compared to prior radiograph. No consolidative changes. There is no pleural effusion pneumothorax. Stable cardiomegaly. No acute osseous pathology. IMPRESSION: Interval improvement in aeration of the lungs and vascular congestion. Electronically Signed   By: Anner Crete M.D.   On: 05/21/2021 18:58   CT HEAD WO CONTRAST (5MM)  Result Date: 05/21/2021 CLINICAL DATA:  Headache.  Weakness. EXAM: CT HEAD WITHOUT CONTRAST TECHNIQUE: Contiguous axial images were obtained from the base of the skull through the vertex without intravenous contrast. COMPARISON:  09/11/2015 FINDINGS: Brain: There is no evidence for acute hemorrhage, hydrocephalus, mass lesion, or abnormal extra-axial fluid collection. No definite CT evidence for acute infarction. Vascular: No  hyperdense vessel or unexpected calcification. Skull: No evidence for fracture. No worrisome lytic or sclerotic lesion. Sinuses/Orbits: The visualized paranasal sinuses and mastoid air cells are clear. Visualized portions of the globes and intraorbital fat are unremarkable. Other: None. IMPRESSION: No acute intracranial abnormality. Electronically Signed   By: Misty Stanley M.D.   On: 05/21/2021 07:24   DG Chest Portable 1 View  Result Date: 05/20/2021 CLINICAL DATA:  Chest xray ordered for SOB. Pt missed recent dialysis. No triage notes. EXAM: PORTABLE CHEST 1 VIEW COMPARISON:  None. FINDINGS: Cardiac silhouette borderline enlarged. No mediastinal or hilar masses. Lungs demonstrate interstitial prominence with hazy airspace opacities noted in the lower lungs, new since the prior exam. No visualized pleural effusion.  No pneumothorax. Skeletal structures are grossly intact. IMPRESSION: 1. Interstitial thickening with mild hazy lower lung zone opacity consistent with pulmonary  edema. Electronically Signed   By: Lajean Manes M.D.   On: 05/20/2021 19:52     TODAY-DAY OF DISCHARGE:  Subjective:   Angla Delahunt today has no headache,no chest abdominal pain,no new weakness tingling or numbness, feels much better wants to go home today.  Objective:   Blood pressure (!) 171/116, pulse 79, temperature 98.7 F (37.1 C), temperature source Oral, resp. rate 20, height 5' (1.524 m), weight (!) 139.7 kg, last menstrual period 04/24/2021, SpO2 97 %.  Intake/Output Summary (Last 24 hours) at 05/24/2021 0950 Last data filed at 05/23/2021 1924 Gross per 24 hour  Intake 480 ml  Output 3000 ml  Net -2520 ml   Filed Weights   05/23/21 1530 05/23/21 1924 05/24/21 0500  Weight: 63.5 kg 60.5 kg (!) 139.7 kg    Exam: Awake Alert, Oriented *3, No new F.N deficits, Normal affect [place].AT,PERRAL Supple Neck,No JVD, No cervical lymphadenopathy appriciated.  Symmetrical Chest wall movement, Good air movement  bilaterally, CTAB RRR,No Gallops,Rubs or new Murmurs, No Parasternal Heave +ve B.Sounds, Abd Soft, Non tender, No organomegaly appriciated, No rebound -guarding or rigidity. No Cyanosis, Clubbing or edema, No new Rash or bruise   PERTINENT RADIOLOGIC STUDIES: No results found.   PERTINENT LAB RESULTS: CBC: No results for input(s): WBC, HGB, HCT, PLT in the last 72 hours. CMET CMP     Component Value Date/Time   NA 133 (L) 05/21/2021 1058   K 5.0 05/21/2021 1058   CL 99 05/21/2021 1058   CO2 24 05/21/2021 1058   GLUCOSE 79 05/21/2021 1058   BUN 37 (H) 05/21/2021 1058   CREATININE 11.08 (H) 05/21/2021 1058   CALCIUM 8.3 (L) 05/21/2021 1058   CALCIUM 8.3 (L) 08/27/2007 2000   PROT 7.2 05/21/2021 0500   ALBUMIN 3.1 (L) 05/21/2021 0500   AST 18 05/21/2021 0500   ALT 11 05/21/2021 0500   ALKPHOS 67 05/21/2021 0500   BILITOT 0.7 05/21/2021 0500   GFRNONAA 4 (L) 05/21/2021 1058   GFRAA 16 (L) 10/19/2019 0905    GFR Estimated Creatinine Clearance: 9.4 mL/min (A) (by C-G formula based on SCr of 11.08 mg/dL (H)). No results for input(s): LIPASE, AMYLASE in the last 72 hours. No results for input(s): CKTOTAL, CKMB, CKMBINDEX, TROPONINI in the last 72 hours. Invalid input(s): POCBNP No results for input(s): DDIMER in the last 72 hours. No results for input(s): HGBA1C in the last 72 hours. Recent Labs    05/22/21 0438  TRIG 66   No results for input(s): TSH, T4TOTAL, T3FREE, THYROIDAB in the last 72 hours.  Invalid input(s): FREET3 No results for input(s): VITAMINB12, FOLATE, FERRITIN, TIBC, IRON, RETICCTPCT in the last 72 hours. Coags: No results for input(s): INR in the last 72 hours.  Invalid input(s): PT Microbiology: Recent Results (from the past 240 hour(s))  Resp Panel by RT-PCR (Flu A&B, Covid) Nasopharyngeal Swab     Status: None   Collection Time: 05/20/21 11:25 PM   Specimen: Nasopharyngeal Swab; Nasopharyngeal(NP) swabs in vial transport medium  Result Value  Ref Range Status   SARS Coronavirus 2 by RT PCR NEGATIVE NEGATIVE Final    Comment: (NOTE) SARS-CoV-2 target nucleic acids are NOT DETECTED.  The SARS-CoV-2 RNA is generally detectable in upper respiratory specimens during the acute phase of infection. The lowest concentration of SARS-CoV-2 viral copies this assay can detect is 138 copies/mL. A negative result does not preclude SARS-Cov-2 infection and should not be used as the sole basis for treatment or other patient management decisions.  A negative result may occur with  improper specimen collection/handling, submission of specimen other than nasopharyngeal swab, presence of viral mutation(s) within the areas targeted by this assay, and inadequate number of viral copies(<138 copies/mL). A negative result must be combined with clinical observations, patient history, and epidemiological information. The expected result is Negative.  Fact Sheet for Patients:  EntrepreneurPulse.com.au  Fact Sheet for Healthcare Providers:  IncredibleEmployment.be  This test is no t yet approved or cleared by the [place] FDA and  has been authorized for detection and/or diagnosis of SARS-CoV-2 by FDA under an Emergency Use Authorization (EUA). This EUA will remain  in effect (meaning this test can be used) for the duration of the COVID-19 declaration under Section 564(b)(1) of the Act, 21 U.S.C.section 360bbb-3(b)(1), unless the authorization is terminated  or revoked sooner.       Influenza A by PCR NEGATIVE NEGATIVE Final   Influenza B by PCR NEGATIVE NEGATIVE Final    Comment: (NOTE) The Xpert Xpress SARS-CoV-2/FLU/RSV plus assay is intended as an aid in the diagnosis of influenza from Nasopharyngeal swab specimens and should not be used as a sole basis for treatment. Nasal washings and aspirates are unacceptable for Xpert Xpress SARS-CoV-2/FLU/RSV testing.  Fact Sheet for  Patients: EntrepreneurPulse.com.au  Fact Sheet for Healthcare Providers: IncredibleEmployment.be  This test is not yet approved or cleared by the [place] FDA and has been authorized for detection and/or diagnosis of SARS-CoV-2 by FDA under an Emergency Use Authorization (EUA). This EUA will remain in effect (meaning this test can be used) for the duration of the COVID-19 declaration under Section 564(b)(1) of the Act, 21 U.S.C. section 360bbb-3(b)(1), unless the authorization is terminated or revoked.  Performed at [facility], [place] [address], [place], [place] [address]   MRSA Next Gen by PCR, Nasal     Status: None   Collection Time: 05/21/21  1:12 AM   Specimen: Nasal Mucosa; Nasal Swab  Result Value Ref Range Status   MRSA by PCR Next Gen NOT DETECTED NOT DETECTED Final    Comment: (NOTE) The GeneXpert MRSA Assay (FDA approved for NASAL specimens only), is one component of a comprehensive MRSA colonization surveillance program. It is not intended to diagnose MRSA infection nor to guide or monitor treatment for MRSA infections. Test performance is not FDA approved in patients less than 60 years old. Performed at [facility], [place] [address] [place], [place] [address]     FURTHER DISCHARGE INSTRUCTIONS:  Get Medicines reviewed and adjusted: Please take all your medications with you for your next visit with your Primary MD  Laboratory/radiological data: Please request your Primary MD to go over all hospital tests and procedure/radiological results at the follow up, please ask your Primary MD to get all Hospital records sent to his/her office.  In some cases, they will be blood work, cultures and biopsy results pending at the time of your discharge. Please request that your primary care M.D. goes through all the records of your hospital data and follows up on these results.  Also Note the following: If you experience  worsening of your admission symptoms, develop shortness of breath, life threatening emergency, suicidal or homicidal thoughts you must seek medical attention immediately by calling 911 or calling your MD immediately  if symptoms less severe.  You must read complete instructions/literature along with all the possible adverse reactions/side effects for all the Medicines you take and that have been prescribed to you. Take any new  Medicines after you have completely understood and accpet all the possible adverse reactions/side effects.   Do not drive when taking Pain medications or sleeping medications (Benzodaizepines)  Do not take more than prescribed Pain, Sleep and Anxiety Medications. It is not advisable to combine anxiety,sleep and pain medications without talking with your primary care practitioner  Special Instructions: If you have smoked or chewed Tobacco  in the last 2 yrs please stop smoking, stop any regular Alcohol  and or any Recreational drug use.  Wear Seat belts while driving.  Please note: You were cared for by a hospitalist during your hospital stay. Once you are discharged, your primary care physician will handle any further medical issues. Please note that NO REFILLS for any discharge medications will be authorized once you are discharged, as it is imperative that you return to your primary care physician (or establish a relationship with a primary care physician if you do not have one) for your post hospital discharge needs so that they can reassess your need for medications and monitor your lab values.  Total Time spent coordinating discharge including counseling, education and face to face time equals 35 minutes.  SignedOren Binet 05/24/2021 9:50 AM

## 2021-05-24 NOTE — Progress Notes (Signed)
[facility] Progress Note  Subjective: no new c/o's  Vitals:   05/24/21 0400 05/24/21 0500 05/24/21 0618 05/24/21 0800  BP: (!) 165/102  (!) 171/105 (!) 171/116  Pulse: 80   79  Resp: (!) 23   20  Temp: 98.8 F (37.1 C)   98.7 F (37.1 C)  TempSrc: Oral   Oral  SpO2: 97%   97%  Weight:  (!) 139.7 kg    Height:        Exam:  alert, nad   no jvd  Chest cta bilat  Cor reg no RG  Abd soft ntnd no ascites   Ext no LE edema   Alert, NF, ox3   LFA AVF +bruit        Home meds include - coreg 25 bid, cozaar 50, labetalol 200 bid, procardia sl 90, tums ac tid, remeron, vits/ supps/ prns        OP HD: [facility] MWF   3h 20min  400/500  60kg  2K/3Ca bath  Hep 3500  LFA AVF  - hect 9 ug tiw   - mircera 150 q2 last 11/22, due 12/06         Assessment/ Plan: Resp distress/ pulm edema - improved w/ inpatient HD 11/28 overnight. May have been component of flash pulm edema as well due to severe HTN (240/ 130- 140's). F/U CXR 11/28 shows resolved edema.  ESRD - usual HD is MWF.  Pt missed Friday. Had HD here early 11/28 am. HD today on schedule.  Uncont HTN/ volume - getting home meds, sp cleviprex gtt. BP's remain borderline but this is typical for her. Looking at OP HD data, recent pre-HD BPs are 180-230/ 120's, post HD on average are in the 160's/ 90's. We will continue to work on her BP in OP setting. Did not get her under dry wt here. Keep prior dry wt on dc.  Anemia ckd - next esa due on 12/06 MBD ckd - cont binders, Ca wnl Hx failed renal transplant - is off tx meds now as of 2-3 mos ago  Dispo - likely dc today   Kerry Cook 05/24/2021, 2:25 PM   Recent Labs  Lab 05/21/21 0237 05/21/21 0238 05/21/21 0500 05/21/21 1058  K 5.2*  --  3.1* 5.0  BUN 104*  --  30* 37*  CREATININE 20.74*  --  8.21* 11.08*  CALCIUM 7.9*  --  8.4* 8.3*  PHOS 7.5*  --   --   --   HGB  --  9.4* 9.6*  --     Inpatient medications:  Chlorhexidine Gluconate Cloth  6 each Topical  Daily   heparin  5,000 Units Subcutaneous Q8H   labetalol  300 mg Oral BID   losartan  50 mg Oral BID   mirtazapine  30 mg Oral QHS   NIFEdipine  90 mg Oral Daily   pantoprazole  40 mg Oral QHS     acetaminophen, docusate sodium, labetalol, ondansetron (ZOFRAN) IV, polyethylene glycol

## 2021-05-24 NOTE — TOC Transition Note (Signed)
Transition of Care [facility]) - CM/SW Discharge Note   Patient Details  Name: Kerry Cook MRN: [id number] Date of Birth: 08-22-1985  [facility] CM/SW Contact:  Cyndi Bender, RN Phone Number: 05/24/2021, 1:39 PM   Clinical Narrative:    Patient stable for discharge.  PCP appointment made.  Patient is aware of appointment time and place.   Final next level of care: Home/Self Care Barriers to Discharge: Barriers Resolved   Patient Goals and CMS Choice  Return home      Discharge Placement                 home      Discharge Plan and Services                                     Social Determinants of Health (SDOH) Interventions     Readmission Risk Interventions Readmission Risk Prevention Plan 05/24/2021  Post Dischage Appt Complete  Medication Screening Complete  Transportation Screening Complete  Some recent data might be hidden

## 2021-05-24 NOTE — Care Management Important Message (Signed)
Important Message  Patient Details  Name: Kerry Cook MRN: [id number] Date of Birth: [birth year]   Medicare Important Message Given:  Yes  Patient left prior to IM delivery will mail to the patient home address.   Dorna Mallet 05/24/2021, 2:18 PM

## 2021-05-24 NOTE — Care Management Important Message (Signed)
Important Message  Patient Details  Name: NAYARA TAPLIN MRN: [id number] Date of Birth: 1985-06-28   Medicare Important Message Given:  Yes     Orbie Pyo 05/24/2021, 2:21 PM

## 2021-05-24 NOTE — Progress Notes (Signed)
Pt for d/c today. Contacted [facility] to make them aware pt to resume care tomorrow.   Melven Sartorius Renal Navigator [phone]

## 2021-07-03 NOTE — Progress Notes (Signed)
Erroneous encounter

## 2021-07-09 ENCOUNTER — Encounter: Payer: Medicare Other | Admitting: Family

## 2021-07-09 DIAGNOSIS — D631 Anemia in chronic kidney disease: Secondary | ICD-10-CM

## 2021-07-09 DIAGNOSIS — K259 Gastric ulcer, unspecified as acute or chronic, without hemorrhage or perforation: Secondary | ICD-10-CM

## 2021-07-09 DIAGNOSIS — E349 Endocrine disorder, unspecified: Secondary | ICD-10-CM

## 2021-07-09 DIAGNOSIS — Z992 Dependence on renal dialysis: Secondary | ICD-10-CM

## 2021-07-09 DIAGNOSIS — J9601 Acute respiratory failure with hypoxia: Secondary | ICD-10-CM

## 2021-07-09 DIAGNOSIS — Z09 Encounter for follow-up examination after completed treatment for conditions other than malignant neoplasm: Secondary | ICD-10-CM

## 2021-07-09 DIAGNOSIS — I161 Hypertensive emergency: Secondary | ICD-10-CM

## 2021-07-09 DIAGNOSIS — Z7689 Persons encountering health services in other specified circumstances: Secondary | ICD-10-CM

## 2021-07-09 DIAGNOSIS — E875 Hyperkalemia: Secondary | ICD-10-CM

## 2021-07-22 ENCOUNTER — Other Ambulatory Visit: Payer: Self-pay

## 2021-07-22 ENCOUNTER — Encounter (HOSPITAL_COMMUNITY): Payer: Self-pay

## 2021-07-22 ENCOUNTER — Emergency Department (HOSPITAL_COMMUNITY): Payer: Medicare Other

## 2021-07-22 ENCOUNTER — Emergency Department (HOSPITAL_COMMUNITY)
Admission: EM | Admit: 2021-07-22 | Discharge: 2021-07-22 | Discharge status: Against Medical Advice | Payer: Medicare Other | Attending: Emergency Medicine | Admitting: Emergency Medicine

## 2021-07-22 DIAGNOSIS — R0602 Shortness of breath: Secondary | ICD-10-CM | POA: Insufficient documentation

## 2021-07-22 DIAGNOSIS — R059 Cough, unspecified: Secondary | ICD-10-CM | POA: Diagnosis not present

## 2021-07-22 DIAGNOSIS — Z5321 Procedure and treatment not carried out due to patient leaving prior to being seen by health care provider: Secondary | ICD-10-CM | POA: Insufficient documentation

## 2021-07-22 DIAGNOSIS — Z992 Dependence on renal dialysis: Secondary | ICD-10-CM | POA: Insufficient documentation

## 2021-07-22 DIAGNOSIS — N186 End stage renal disease: Secondary | ICD-10-CM | POA: Diagnosis not present

## 2021-07-22 DIAGNOSIS — I120 Hypertensive chronic kidney disease with stage 5 chronic kidney disease or end stage renal disease: Secondary | ICD-10-CM | POA: Diagnosis not present

## 2021-07-22 DIAGNOSIS — I161 Hypertensive emergency: Secondary | ICD-10-CM

## 2021-07-22 DIAGNOSIS — Z20822 Contact with and (suspected) exposure to covid-19: Secondary | ICD-10-CM | POA: Diagnosis not present

## 2021-07-22 LAB — COMPREHENSIVE METABOLIC PANEL
ALT: 17 U/L (ref 0–44)
AST: 22 U/L (ref 15–41)
Albumin: 3.4 g/dL — ABNORMAL LOW (ref 3.5–5.0)
Alkaline Phosphatase: 51 U/L (ref 38–126)
Anion gap: 15 (ref 5–15)
BUN: 57 mg/dL — ABNORMAL HIGH (ref 6–20)
CO2: 25 mmol/L (ref 22–32)
Calcium: 9.4 mg/dL (ref 8.9–10.3)
Chloride: 96 mmol/L — ABNORMAL LOW (ref 98–111)
Creatinine, Ser: 13.31 mg/dL — ABNORMAL HIGH (ref 0.44–1.00)
GFR, Estimated: 3 mL/min — ABNORMAL LOW (ref >60)
Glucose, Bld: 93 mg/dL (ref 70–99)
Potassium: 5 mmol/L (ref 3.5–5.1)
Sodium: 136 mmol/L (ref 135–145)
Total Bilirubin: 0.6 mg/dL (ref 0.3–1.2)
Total Protein: 6.9 g/dL (ref 6.5–8.1)

## 2021-07-22 LAB — CBC
HCT: 31.4 % — ABNORMAL LOW (ref 36.0–46.0)
Hemoglobin: 10.1 g/dL — ABNORMAL LOW (ref 12.0–15.0)
MCH: 31.6 pg (ref 26.0–34.0)
MCHC: 32.2 g/dL (ref 30.0–36.0)
MCV: 98.1 fL (ref 80.0–100.0)
Platelets: 154 10*3/uL (ref 150–400)
RBC: 3.2 MIL/uL — ABNORMAL LOW (ref 3.87–5.11)
RDW: 21.6 % — ABNORMAL HIGH (ref 11.5–15.5)
WBC: 6.9 10*3/uL (ref 4.0–10.5)
nRBC: 0 % (ref 0.0–0.2)

## 2021-07-22 LAB — RESP PANEL BY RT-PCR (FLU A&B, COVID) ARPGX2
Influenza A by PCR: NEGATIVE
Influenza B by PCR: NEGATIVE
SARS Coronavirus 2 by RT PCR: NEGATIVE

## 2021-07-22 LAB — TROPONIN I (HIGH SENSITIVITY)
Troponin I (High Sensitivity): 104 ng/L (ref <18)
Troponin I (High Sensitivity): 96 ng/L — ABNORMAL HIGH (ref <18)

## 2021-07-22 LAB — I-STAT BETA HCG BLOOD, ED (MC, WL, AP ONLY): I-stat hCG, quantitative: <5 m[IU]/mL (ref <5)

## 2021-07-22 MED ORDER — LABETALOL HCL 5 MG/ML IV SOLN
10.0000 mg | Freq: Once | INTRAVENOUS | Status: AC
  Administered 2021-07-22: 10 mg via INTRAVENOUS
  Filled 2021-07-22: qty 4

## 2021-07-22 NOTE — ED Notes (Signed)
Pt oxygen room air while ambulated went from 100 to 91

## 2021-07-22 NOTE — ED Provider Notes (Signed)
[facility] Emergency Department Provider Note MRN:  [id number]  Arrival date & time: 07/22/21     Chief Complaint   Shortness of Breath   History of Present Illness   Kerry Cook is a 36 y.o. year-old female with a history of ESRD on HD since 2000 and presenting to the ED with chief complaint of chronic cough that has been ongoing for 3-week.  Is a 36 year old female who present emerged part for evaluation of cough has been ongoing for 3 weeks.  Patient states that she only feels short of breath after coughing.  States she has no shortness of breath with exertion or at rest.  She states she has had no chest pain.  She reports that she is a Monday Wednesday Friday dialyzer and the second 1 was to be dialyzed in the morning.  She states that she has had no sick contacts and has had no production to her cough.  She does report that she has had chronic postnasal drip and does suffer from gastric reflux.  She denies that she is having chest pain, abdominal pain and states that she does not make urine.  Review of Systems  A thorough review of systems was obtained and all systems are negative except as noted in the HPI and PMH.   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 and is back on HD MWF   History of kidney transplant    1st transplant was 2012- 2015, 2nd transplant was 2018- 2022 approx   Hypertension    Multiple gastric ulcers    Pneumonia 01/23/2015    Past Surgical History:  Procedure Laterality Date   ESOPHAGOGASTRODUODENOSCOPY N/A 04/01/2016   Procedure: ESOPHAGOGASTRODUODENOSCOPY (EGD);  Surgeon: Gatha Mayer, MD;  Location: [facility] ENDOSCOPY;  Service: Endoscopy;  Laterality: N/A;   ESOPHAGOGASTRODUODENOSCOPY (EGD) WITH PROPOFOL N/A 11/20/2016   Procedure: ESOPHAGOGASTRODUODENOSCOPY (EGD) WITH PROPOFOL;  Surgeon:  Doran Stabler, MD;  Location: [facility];  Service: Endoscopy;  Laterality: N/A;   KIDNEY TRANSPLANT Bilateral 2012    Family History  Problem Relation Age of Onset   Kidney disease Father    Lupus Maternal Grandmother    Cancer Maternal Grandfather    Colon cancer Neg Hx    Stomach cancer Neg Hx    Rectal cancer Neg Hx    Esophageal cancer Neg Hx    Liver cancer Neg Hx     Social History   Socioeconomic History   Marital status: Single    Spouse name: Not on file   Number of children: 0   Years of education: Not on file   Highest education level: Not on file  Occupational History   Occupation: Daycare    Employer: [employer]  Tobacco Use   Smoking status: Never   Smokeless tobacco: Never  Vaping Use   Vaping Use: Never used  Substance and Sexual Activity   Alcohol use: No    Alcohol/week: 0.0 standard drinks   Drug use: No   Sexual activity: Yes    Partners: Male    Birth control/protection: Condom, None  Other Topics Concern   Not on file  Social History Narrative   Engaged no children works in [employer]   Social Determinants of Radio broadcast assistant Strain: Not on [employer]  Insecurity: Not on file  Transportation Needs: Not on file  Physical Activity: Not on file  Stress: Not on file  Social Connections: Not on file  Intimate Partner Violence: Not on file     Physical Exam   Physical Exam Constitutional:      Appearance: She is well-developed. She is not ill-appearing, toxic-appearing or diaphoretic.  HENT:     Head: Normocephalic and atraumatic.     Right Ear: External ear normal.     Left Ear: External ear normal.     Nose: Nose normal.  Cardiovascular:     Rate and Rhythm: Normal rate and regular rhythm.     Pulses: Normal pulses.     Heart sounds: Normal heart sounds.  Pulmonary:     Effort: Pulmonary effort is normal.     Breath sounds: Rales present.  Abdominal:     General: Abdomen is flat.      Palpations: Abdomen is soft.  Musculoskeletal:     Cervical back: Neck supple.  Skin:    General: Skin is warm and dry.  Neurological:     General: No focal deficit present.     Mental Status: She is alert and oriented to person, place, and time.     Cranial Nerves: No cranial nerve deficit.     Sensory: No sensory deficit.     Motor: No weakness.      Diagnostic and Interventional Summary     Labs Reviewed  CBC - Abnormal; Notable for the following components:      Result Value   RBC 3.20 (*)    Hemoglobin 10.1 (*)    HCT 31.4 (*)    RDW 21.6 (*)    All other components within normal limits  COMPREHENSIVE METABOLIC PANEL - Abnormal; Notable for the following components:   Chloride 96 (*)    BUN 57 (*)    Creatinine, Ser 13.31 (*)    Albumin 3.4 (*)    GFR, Estimated 3 (*)    All other components within normal limits  TROPONIN I (HIGH SENSITIVITY) - Abnormal; Notable for the following components:   Troponin I (High Sensitivity) 104 (*)    All other components within normal limits  TROPONIN I (HIGH SENSITIVITY) - Abnormal; Notable for the following components:   Troponin I (High Sensitivity) 96 (*)    All other components within normal limits  RESP PANEL BY RT-PCR (FLU A&B, COVID) ARPGX2  I-STAT BETA HCG BLOOD, ED ([facility], [facility], [facility] ONLY)  I-STAT CHEM 8, ED    DG Chest 2 View  Final Result      Medications  labetalol (NORMODYNE) injection 10 mg (10 mg Intravenous Given 07/22/21 2248)     Procedures  /  Critical Care Procedures  ED Course and Medical Decision Making  Initial Impression and Ddx 36 year old female presents emerged part for cough.  Feels that the cough is likely from a viral infection however differential diagnosis includes recently due to postnasal drip, gastric reflux, or fluid overload in the setting of ESRD.  The patient's initial vital signs were significant for hypertension with systolic blood pressures greater than 229.  Patient was also hypoxic  to 85% on room air.  Patient was placed on 2 L nasal cannula which resolved her hypoxia.  The patient was also given IV antihypertensives.  I informed the patient that given her grossly abnormal vital signs I would recommend that she stay in the hospital for hemodialysis and then I will be touching base  with the on-call nephrology team however will give the patient this information she requested to be discharged.  The patient was fully alert and oriented at this time and did not want to stay for treatment here in the emergency department and wanted to be discharged that she could go to her regular scheduled HD session in the morning.  Prior to being discharged AMA I reviewed the patient's labs with her which revealed a troponin 96, hCG less than 5, and potassium of 5.  COVID/flu negative.  EKG did not reveal peaked T waves to suggest hyperkalemia.  I expressed that the patient should follow-up with the nephrology team in the morning for hemodialysis.  I also informed the patient that I would be more than happy to see her again tomorrow.  Factors Impacting ED Encounter Risk Consideration of hospitalization    Final Clinical Impressions(s) / ED Diagnoses     ICD-10-CM   1. Hypertensive emergency  I16.1             Zachery Dakins, MD 07/22/21 2340    Lacretia Leigh, MD 07/24/21 1313

## 2021-07-22 NOTE — ED Triage Notes (Signed)
Pt presents to the ED from home via [facility] with complaints of SOB. Pt is on diaylsis, M/W/F, has not missed any sessions. Pt states she has been sick with a cold for the last 3 weeks, now SOB with productive cough. Pt denies fever and states this does not feel like when she is in overload.

## 2021-07-22 NOTE — ED Provider Notes (Signed)
I saw and evaluated the patient, reviewed the resident's note and I agree with the findings and plan.  EKG Interpretation  Date/Time:  Sunday July 22 2021 21:39:14 EST Ventricular Rate:  91 PR Interval:  139 QRS Duration: 114 QT Interval:  396 QTC Calculation: 488 R Axis:   61 Text Interpretation: Sinus rhythm LVH with secondary repolarization abnormality Borderline prolonged QT interval No significant change since last tracing Confirmed by Lacretia Leigh ([id number]) on 07/22/2021 9:17:21 PM 36 year old female history of incisional disease presents with shortness of breath.  Chest x-ray shows fluid overload.  Patient is profoundly hypertensive here.  Will consult nephrology for dialysis   Lacretia Leigh, MD 07/22/21 2223

## 2021-07-22 NOTE — ED Notes (Signed)
Pt O2 decreased to 2L at this time, Spo2 maintaining at 98%. No coughing noted currently. Will continue to monitor.

## 2021-07-22 NOTE — ED Notes (Signed)
Pt asked said RN how much longer it would take. RN talked to MD. MD came and talked to pt. RN explained to Pt her elevated BP and troponin level and should stay. We are waiting on her second troponin to result. Pt stated she "wanted to leave, I have my dialysis appointment at [place]. My blood pressure normally runs that high." Pt stated that she is also no longer short of breath.

## 2021-07-22 NOTE — ED Notes (Signed)
Pt changed from NRB at 10L to [place] at 4L at this time. Spo2 remains at 93%. Pt states her Spo2 will drop when coughing because she can't catch her breath. Pt tolerating Viola without issues. Will continue to monitor.

## 2021-08-08 ENCOUNTER — Emergency Department (HOSPITAL_COMMUNITY): Payer: Medicare Other

## 2021-08-08 ENCOUNTER — Other Ambulatory Visit: Payer: Self-pay

## 2021-08-08 ENCOUNTER — Emergency Department (HOSPITAL_COMMUNITY)
Admission: EM | Admit: 2021-08-08 | Discharge: 2021-08-09 | Disposition: A | Discharge status: Home / Self Care | Payer: Medicare Other | Attending: Emergency Medicine | Admitting: Emergency Medicine

## 2021-08-08 DIAGNOSIS — R0602 Shortness of breath: Secondary | ICD-10-CM | POA: Diagnosis not present

## 2021-08-08 DIAGNOSIS — I158 Other secondary hypertension: Secondary | ICD-10-CM

## 2021-08-08 DIAGNOSIS — H1131 Conjunctival hemorrhage, right eye: Secondary | ICD-10-CM

## 2021-08-08 DIAGNOSIS — Z992 Dependence on renal dialysis: Secondary | ICD-10-CM | POA: Insufficient documentation

## 2021-08-08 DIAGNOSIS — Z940 Kidney transplant status: Secondary | ICD-10-CM | POA: Diagnosis not present

## 2021-08-08 DIAGNOSIS — I120 Hypertensive chronic kidney disease with stage 5 chronic kidney disease or end stage renal disease: Secondary | ICD-10-CM | POA: Insufficient documentation

## 2021-08-08 DIAGNOSIS — Z79899 Other long term (current) drug therapy: Secondary | ICD-10-CM | POA: Diagnosis not present

## 2021-08-08 DIAGNOSIS — N186 End stage renal disease: Secondary | ICD-10-CM | POA: Insufficient documentation

## 2021-08-08 DIAGNOSIS — R519 Headache, unspecified: Secondary | ICD-10-CM | POA: Diagnosis not present

## 2021-08-08 DIAGNOSIS — X58XXXA Exposure to other specified factors, initial encounter: Secondary | ICD-10-CM | POA: Diagnosis not present

## 2021-08-08 DIAGNOSIS — S0591XA Unspecified injury of right eye and orbit, initial encounter: Secondary | ICD-10-CM | POA: Diagnosis present

## 2021-08-08 DIAGNOSIS — S0511XA Contusion of eyeball and orbital tissues, right eye, initial encounter: Secondary | ICD-10-CM | POA: Insufficient documentation

## 2021-08-08 LAB — BASIC METABOLIC PANEL
Anion gap: 12 (ref 5–15)
BUN: 24 mg/dL — ABNORMAL HIGH (ref 6–20)
CO2: 29 mmol/L (ref 22–32)
Calcium: 10.2 mg/dL (ref 8.9–10.3)
Chloride: 96 mmol/L — ABNORMAL LOW (ref 98–111)
Creatinine, Ser: 7.81 mg/dL — ABNORMAL HIGH (ref 0.44–1.00)
GFR, Estimated: 6 mL/min — ABNORMAL LOW (ref >60)
Glucose, Bld: 95 mg/dL (ref 70–99)
Potassium: 3.7 mmol/L (ref 3.5–5.1)
Sodium: 137 mmol/L (ref 135–145)

## 2021-08-08 LAB — CBC WITH DIFFERENTIAL/PLATELET
Abs Immature Granulocytes: 0.02 10*3/uL (ref 0.00–0.07)
Basophils Absolute: 0 10*3/uL (ref 0.0–0.1)
Basophils Relative: 1 %
Eosinophils Absolute: 0.7 10*3/uL — ABNORMAL HIGH (ref 0.0–0.5)
Eosinophils Relative: 12 %
HCT: 35.5 % — ABNORMAL LOW (ref 36.0–46.0)
Hemoglobin: 10.9 g/dL — ABNORMAL LOW (ref 12.0–15.0)
Immature Granulocytes: 0 %
Lymphocytes Relative: 24 %
Lymphs Abs: 1.4 10*3/uL (ref 0.7–4.0)
MCH: 31.2 pg (ref 26.0–34.0)
MCHC: 30.7 g/dL (ref 30.0–36.0)
MCV: 101.7 fL — ABNORMAL HIGH (ref 80.0–100.0)
Monocytes Absolute: 0.4 10*3/uL (ref 0.1–1.0)
Monocytes Relative: 7 %
Neutro Abs: 3.1 10*3/uL (ref 1.7–7.7)
Neutrophils Relative %: 56 %
Platelets: 183 10*3/uL (ref 150–400)
RBC: 3.49 MIL/uL — ABNORMAL LOW (ref 3.87–5.11)
RDW: 20.5 % — ABNORMAL HIGH (ref 11.5–15.5)
WBC: 5.6 10*3/uL (ref 4.0–10.5)
nRBC: 0 % (ref 0.0–0.2)

## 2021-08-08 LAB — PROTIME-INR
INR: 1 (ref 0.8–1.2)
Prothrombin Time: 13.4 seconds (ref 11.4–15.2)

## 2021-08-08 LAB — TROPONIN I (HIGH SENSITIVITY): Troponin I (High Sensitivity): 138 ng/L (ref <18)

## 2021-08-08 MED ORDER — IOHEXOL 300 MG/ML  SOLN
75.0000 mL | Freq: Once | INTRAMUSCULAR | Status: AC | PRN
  Administered 2021-08-08: 75 mL via INTRAVENOUS

## 2021-08-08 NOTE — ED Notes (Signed)
Pt reported to nursing tech in lobby increasing [facility]. Pt brought to room in triage and re-evaluated by this RN. New orders given.

## 2021-08-08 NOTE — ED Provider Triage Note (Signed)
Emergency Medicine Provider Triage Evaluation Note  Kerry Cook , a 36 y.o. female  was evaluated in triage.  Pt complains of right eye pain, swelling, vision changes worsening over last 2 weeks.  History of prior kidney transplant current ESRD.  Over the last 2 weeks has noted pain to her right eye.  Progressive redness.  She feels like she has right-sided headache worse than typical migraine.  Feels like her right eye is bulging and now she is having decreased vision to the right eye.  No hx of thyroid problems. Does not wear contacts.  No known traumatic injury.  Severe pain to eye with movement. No lid swelling  Review of Systems  Positive: Headache, right eye swelling, pain with ROM Negative: Neck pain, numbness, weakness  Physical Exam  BP (!) 177/118    Pulse 98    Temp 98.7 F (37.1 C) (Oral)    Resp 17    Ht 5' (1.524 m)    Wt 60 kg    SpO2 100%    BMI 25.83 kg/m  Gen:   Awake, no distress   Eye:  Right eye subconjunctival hemorrhage, pain with EOMs to right eye.  Pupils equal and reactive Resp:  Normal effort  MSK:   Moves extremities without difficulty  Other:    Medical Decision Making  Medically screening exam initiated at 6:01 PM.  Appropriate orders placed.  SHAYLA HEMING was informed that the remainder of the evaluation will be completed by another provider, this initial triage assessment does not replace that evaluation, and the importance of remaining in the ED until their evaluation is complete.  Right eye vision change, headache   Sheniqua Carolan A, PA-C 08/08/21 1804

## 2021-08-08 NOTE — ED Triage Notes (Signed)
C/O pain on right side of eye; itching and watery as well.

## 2021-08-09 ENCOUNTER — Emergency Department (HOSPITAL_COMMUNITY): Payer: Medicare Other

## 2021-08-09 DIAGNOSIS — S0511XA Contusion of eyeball and orbital tissues, right eye, initial encounter: Secondary | ICD-10-CM | POA: Diagnosis not present

## 2021-08-09 LAB — TROPONIN I (HIGH SENSITIVITY): Troponin I (High Sensitivity): 139 ng/L (ref <18)

## 2021-08-09 MED ORDER — LABETALOL HCL 200 MG PO TABS
300.0000 mg | ORAL_TABLET | Freq: Once | ORAL | Status: AC
  Administered 2021-08-09: 300 mg via ORAL
  Filled 2021-08-09: qty 2

## 2021-08-09 MED ORDER — LABETALOL HCL 5 MG/ML IV SOLN
10.0000 mg | Freq: Once | INTRAVENOUS | Status: AC
  Administered 2021-08-09: 10 mg via INTRAVENOUS
  Filled 2021-08-09: qty 4

## 2021-08-09 MED ORDER — ARTIFICIAL TEARS OPHTHALMIC OINT
1.0000 "application " | TOPICAL_OINTMENT | Freq: Once | OPHTHALMIC | Status: AC
  Administered 2021-08-09: 1 via OPHTHALMIC
  Filled 2021-08-09: qty 3.5

## 2021-08-09 MED ORDER — NIFEDIPINE ER OSMOTIC RELEASE 90 MG PO TB24
90.0000 mg | ORAL_TABLET | Freq: Once | ORAL | Status: AC
  Administered 2021-08-09: 90 mg via ORAL
  Filled 2021-08-09: qty 1

## 2021-08-09 NOTE — ED Provider Notes (Signed)
[facility] EMERGENCY DEPARTMENT Provider Note  CSN: [id number] Arrival date & time: 08/08/21 1646  Chief Complaint(s) Eye Pain  HPI Kerry Cook is a 36 y.o. 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  She reports that she had a small amount of bleeding 2 weeks ago when she awoke from sleep.  Denies any known trauma.  Reported earlier that she had vision blurry vision in the right eye with right sided headache, but tells me she does not have any visual disturbance currently. She attributed the vision change to her headache which has resolved.  No fevers, chills, coughing, or congestion.  Patient has a history of hypertension, reporting that her baseline BPs while on medications are systolics in the 683M to 196Q.  When she misses doses or is not on blood pressure medicine her systolics are in the 229N.  She denies missing any doses of her blood pressure medicine in the last several days but they report that she did not take her afternoon/nighttime medicine tonight due to her being here for prolonged wait time.  Patient did go to her hemodialysis today.  Currently denies any chest pain.  She is endorsing intermittent shortness of breath.  Currently asymptomatic.  The history is provided by the patient.   Past Medical History Past Medical History:  Diagnosis Date   ESRD on hemodialysis ([place])    started HD in 2010, has had kidney transplant x 2. 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 and is back on HD MWF   History of kidney transplant    1st transplant was 2012- 2015, 2nd transplant was 2018- 2022 approx   Hypertension    Multiple gastric ulcers    Pneumonia 01/23/2015   Patient Active Problem List   Diagnosis Date Noted   Hypertensive  emergency 05/20/2021   Acute respiratory failure with hypoxia (HCC)    Symptomatic anemia 12/13/2020   Hyperkalemia 05/19/2016   Medicare annual wellness visit, initial 10/31/2015   Hypertension    History of kidney transplant    Anemia in chronic kidney disease 10/01/2007   End stage renal disease on dialysis ([place]) 10/01/2007   Home Medication(s) Prior to Admission medications   Medication Sig Start Date End Date Taking? Authorizing Provider  b complex-vitamin c-folic acid (NEPHRO-VITE) 0.8 MG TABS tablet Take 1 tablet by mouth daily. 10/24/14  Yes [provider]  calcitRIOL (ROCALTROL) 0.25 MCG capsule Take 0.5 mcg by mouth daily after breakfast.   Yes [provider]  carvedilol (COREG) 25 MG tablet Take 25 mg by mouth 2 (two) times daily. 07/27/21  Yes [provider]  Cholecalciferol (VITAMIN D3) 25 MCG (1000 UT) CAPS Take 1,000 Units by mouth in the morning.   Yes [provider]  Cyanocobalamin (VITAMIN B12) 1000 MCG TBCR Take 1,000 mcg by mouth daily.   Yes [provider]  Drospirenone (SLYND) 4 MG TABS Take 1 tablet by mouth daily with breakfast. 12/11/20  Yes Shelly Bombard, MD  fluticasone [facility]) 50 MCG/ACT nasal spray Place 1 spray into both nostrils daily as needed for allergies or rhinitis. 01/07/21  Yes Rehman, Areeg N, DO  labetalol (NORMODYNE) 300 MG tablet Take 1 tablet (300 mg total) by mouth 2 (two) times daily. Patient taking differently: Take 300 mg by mouth  3 (three) times daily. 05/24/21  Yes Ghimire, Henreitta Leber, MD  losartan (COZAAR) 100 MG tablet Take 1 tablet (100 mg total) by mouth daily. 05/24/21 05/24/22 Yes Ghimire, Henreitta Leber, MD  mirtazapine (REMERON) 15 MG tablet Take 15 mg by mouth in the morning and at bedtime. 11/01/20 11/01/21 Yes [provider]  NIFEdipine (PROCARDIA XL/NIFEDICAL-XL) 90 MG 24 hr tablet Take 1 tablet (90 mg total) by mouth daily. Patient taking differently: Take 90 mg by mouth at bedtime.  12/15/20 08/09/21 Yes Virl Axe, MD  pantoprazole (PROTONIX) 40 MG tablet Take 1 tablet (40 mg total) by mouth at bedtime. 05/24/21  Yes Ghimire, Henreitta Leber, MD  amLODipine (NORVASC) 10 MG tablet Take 1 tablet (10 mg total) by mouth every evening. Patient not taking: No sig reported 09/13/15 12/14/20  [name], MD                                                                                                                                    Allergies Tape and Zosyn [piperacillin sod-tazobactam so]  Review of Systems Review of Systems As noted in HPI  Physical Exam Vital Signs  I have reviewed the triage vital signs BP (!) 192/118 (BP Location: Left Arm)    Pulse 92    Temp 99 F (37.2 C) (Oral)    Resp 18    Ht 5' (1.524 m)    Wt 60 kg    SpO2 98%    BMI 25.83 kg/m   Physical Exam Vitals reviewed.  Constitutional:      General: She is not in acute distress.    Appearance: She is well-developed. She is not diaphoretic.  HENT:     Head: Normocephalic and atraumatic.     Nose: Nose normal.  Eyes:     General: No scleral icterus.       Right eye: No discharge.        Left eye: No discharge.     Conjunctiva/sclera:     Right eye: Right conjunctiva is not injected. Hemorrhage present.     Pupils: Pupils are equal, round, and reactive to light.     Slit lamp exam:    Right eye: No hyphema or photophobia.     Left eye: No hyphema or photophobia.  Cardiovascular:     Rate and Rhythm: Normal rate and regular rhythm.     Heart sounds: No murmur heard.   No friction rub. No gallop.  Pulmonary:     Effort: Pulmonary effort is normal. No respiratory distress.     Breath sounds: Normal breath sounds. No stridor. No rales.  Abdominal:     General: There is no distension.     Palpations: Abdomen is soft.     Tenderness: There is no abdominal tenderness.  Musculoskeletal:        General: No tenderness.     Cervical back: Normal range of motion and neck  supple.  Skin:     General: Skin is warm and dry.     Findings: No erythema or rash.  Neurological:     Mental Status: She is alert and oriented to person, place, and time.    ED Results and Treatments Labs (all labs ordered are listed, but only abnormal results are displayed) Labs Reviewed  CBC WITH DIFFERENTIAL/PLATELET - Abnormal; Notable for the following components:      Result Value   RBC 3.49 (*)    Hemoglobin 10.9 (*)    HCT 35.5 (*)    MCV 101.7 (*)    RDW 20.5 (*)    Eosinophils Absolute 0.7 (*)    All other components within normal limits  BASIC METABOLIC PANEL - Abnormal; Notable for the following components:   Chloride 96 (*)    BUN 24 (*)    Creatinine, Ser 7.81 (*)    GFR, Estimated 6 (*)    All other components within normal limits  TROPONIN I (HIGH SENSITIVITY) - Abnormal; Notable for the following components:   Troponin I (High Sensitivity) 138 (*)    All other components within normal limits  TROPONIN I (HIGH SENSITIVITY) - Abnormal; Notable for the following components:   Troponin I (High Sensitivity) 139 (*)    All other components within normal limits  PROTIME-INR  I-STAT BETA HCG BLOOD, ED ([facility], [facility], [facility] ONLY)                                                                                                                         EKG  EKG Interpretation  Date/Time:  Wednesday August 08 2021 22:22:19 EST Ventricular Rate:  98 PR Interval:  128 QRS Duration: 82 QT Interval:  350 QTC Calculation: 446 R Axis:   -11 Text Interpretation: Normal sinus rhythm Right atrial enlargement Septal infarct , age undetermined ST & T wave abnormality, consider inferolateral ischemia Abnormal ECG ST abnormality present on prior EKGs in January 2023, December 2022 Confirmed by Quintella Reichert [phone]) on 08/09/2021 12:12:41 AM       Radiology CT HEAD WO CONTRAST (5MM)  Result Date: 08/08/2021 CLINICAL DATA:  Headache. EXAM: CT HEAD WITHOUT CONTRAST TECHNIQUE: Contiguous axial images  were obtained from the base of the skull through the vertex without intravenous contrast. RADIATION DOSE REDUCTION: This exam was performed according to the departmental dose-optimization program which includes automated exposure control, adjustment of the mA and/or kV according to patient size and/or use of iterative reconstruction technique. COMPARISON:  Head CT dated 05/21/2021. FINDINGS: Brain: The ventricles and sulci are appropriate size for the patient's age. The gray-white matter discrimination is preserved. There is no acute intracranial hemorrhage. No mass effect or midline shift. No extra-axial fluid collection. Vascular: No hyperdense vessel or unexpected calcification. Skull: Normal. Negative for fracture or focal lesion. Sinuses/Orbits: No acute finding. Other: None IMPRESSION: No acute intracranial pathology. Electronically Signed   By: Anner Crete M.D.   On: 08/08/2021 22:43   DG  Chest Port 1 View  Result Date: 08/09/2021 CLINICAL DATA:  Shortness of breath EXAM: PORTABLE CHEST 1 VIEW COMPARISON:  07/22/2021 FINDINGS: Mild cardiomegaly is unchanged. Both lungs are clear. The visualized skeletal structures are unremarkable. IMPRESSION: Mild cardiomegaly without focal airspace disease. Electronically Signed   By: Ulyses Jarred M.D.   On: 08/09/2021 01:37   CT Orbits W Contrast  Result Date: 08/08/2021 CLINICAL DATA:  Right eye pain and headache EXAM: CT ORBITS WITH CONTRAST TECHNIQUE: Multidetector CT images was performed according to the standard protocol following intravenous contrast administration. RADIATION DOSE REDUCTION: This exam was performed according to the departmental dose-optimization program which includes automated exposure control, adjustment of the mA and/or kV according to patient size and/or use of iterative reconstruction technique. CONTRAST:  43mL OMNIPAQUE IOHEXOL 300 MG/ML  SOLN COMPARISON:  No pertinent prior exam. FINDINGS: Orbits: The globes are intact. Optic  nerves are normal. Normal extraocular muscles and lacrimal glands. The bony orbit, preseptal soft tissues and the intra- and extraconal orbital fat are normal. Visualized sinuses:  No fluid levels or advanced mucosal thickening. Soft tissues: Normal. Limited intracranial: Normal. IMPRESSION: Normal CT of the orbits. Electronically Signed   By: Ulyses Jarred M.D.   On: 08/08/2021 22:42    Pertinent labs & imaging results that were available during my care of the patient were reviewed by me and considered in my medical decision making (see MDM for details).  Medications Ordered in ED Medications  iohexol (OMNIPAQUE) 300 MG/ML solution 75 mL (75 mLs Intravenous Contrast Given 08/08/21 2235)  labetalol (NORMODYNE) tablet 300 mg (300 mg Oral Given 08/09/21 0217)  NIFEdipine (PROCARDIA XL/NIFEDICAL-XL) 24 hr tablet 90 mg (90 mg Oral Given 08/09/21 0224)  labetalol (NORMODYNE) injection 10 mg (10 mg Intravenous Given 08/09/21 0211)  artificial tears (LACRILUBE) ophthalmic ointment 1 application (1 application Right Eye Given 08/09/21 0405)                                                                                                                                     Procedures Procedures  (including critical care time)  Medical Decision Making / ED Course        Eye pain Related to Subconjunctival hemorrhage.  Vision intact.  Patient is not anticoagulated but gets low-dose heparin during dialysis. Given her complaint of eye pain and headache during the MSE process, CT head and CT orbits were ordered. Also noted to be hypertensive with systolics in the 798X and diastolics in the 211H to 417E.  Patient is not having any chest pain or current shortness of breath. Patient reported missing her nighttime dose of meds. Screening labs were obtained to assess for any endorgan damage related to hypertension  Work-up ordered to assess concerns above.  Labs and imaging independently interpreted by me and  noted below: CBC without leukocytosis.  Baseline hemoglobin.  No significant electrolyte derangement.  Metabolic panel consistent with ESRD with  creatinine improved from previous likely due to the patient having dialysis earlier in the day Initial troponin was slightly elevated greater than her baseline.  We will need to repeat this to determine whether this is related to chronic leak due to ESRD versus hypertensive emergency. [place] flat.  Management: Patient provided with single dose of IV labetalol Given oral doses of her nighttime blood pressure medicine BPs improving with systolics in the 518D diastolic in the 1 teens. Patient remains asymptomatic from her blood pressure. Feel she does not require admission for IV BP meds at this time. She is stable for continued outpatient management with her home blood pressure medicine. No intervention needed for the patient's subconjunctival hemorrhage.  Due to slight irritation patient was provided with Lacri-Lube.  Final Clinical Impression(s) / ED Diagnoses Final diagnoses:  SOB (shortness of breath)  Subconjunctival hematoma, right  Other secondary hypertension   The patient appears reasonably screened and/or stabilized for discharge and I doubt any other medical condition or other [facility] requiring further screening, evaluation, or treatment in the ED at this time prior to discharge. Safe for discharge with strict return precautions.  Disposition: Discharge  Condition: Good  I have discussed the results, Dx and Tx plan with the patient/family who expressed understanding and agree(s) with the plan. Discharge instructions discussed at length. The patient/family was given strict return precautions who verbalized understanding of the instructions. No further questions at time of discharge.    ED Discharge Orders     None        Follow Up: Primary care provider  Call  to schedule an appointment for close follow up            This chart was dictated using voice recognition software.  Despite best efforts to proofread,  errors can occur which can change the documentation meaning.    Fatima Blank, MD 08/09/21 [phone]

## 2021-08-20 ENCOUNTER — Inpatient Hospital Stay (HOSPITAL_COMMUNITY): Payer: Medicare Other

## 2021-08-20 ENCOUNTER — Encounter (HOSPITAL_COMMUNITY): Payer: Self-pay | Admitting: Emergency Medicine

## 2021-08-20 ENCOUNTER — Other Ambulatory Visit: Payer: Self-pay

## 2021-08-20 ENCOUNTER — Emergency Department (HOSPITAL_COMMUNITY): Payer: Medicare Other

## 2021-08-20 ENCOUNTER — Inpatient Hospital Stay (HOSPITAL_COMMUNITY)
Admission: EM | Admit: 2021-08-20 | Discharge: 2021-08-20 | DRG: 682 | Disposition: A | Discharge status: Home Health | Payer: Medicare Other | Attending: Internal Medicine | Admitting: Internal Medicine

## 2021-08-20 DIAGNOSIS — Z20822 Contact with and (suspected) exposure to covid-19: Secondary | ICD-10-CM | POA: Diagnosis present

## 2021-08-20 DIAGNOSIS — Z9114 Patient's other noncompliance with medication regimen: Secondary | ICD-10-CM | POA: Diagnosis not present

## 2021-08-20 DIAGNOSIS — I120 Hypertensive chronic kidney disease with stage 5 chronic kidney disease or end stage renal disease: Secondary | ICD-10-CM | POA: Diagnosis not present

## 2021-08-20 DIAGNOSIS — Z9115 Patient's noncompliance with renal dialysis: Secondary | ICD-10-CM | POA: Diagnosis not present

## 2021-08-20 DIAGNOSIS — Y830 Surgical operation with transplant of whole organ as the cause of abnormal reaction of the patient, or of later complication, without mention of misadventure at the time of the procedure: Secondary | ICD-10-CM | POA: Diagnosis not present

## 2021-08-20 DIAGNOSIS — N186 End stage renal disease: Secondary | ICD-10-CM | POA: Diagnosis present

## 2021-08-20 DIAGNOSIS — N179 Acute kidney failure, unspecified: Secondary | ICD-10-CM | POA: Diagnosis not present

## 2021-08-20 DIAGNOSIS — D631 Anemia in chronic kidney disease: Secondary | ICD-10-CM | POA: Diagnosis present

## 2021-08-20 DIAGNOSIS — Z8711 Personal history of peptic ulcer disease: Secondary | ICD-10-CM

## 2021-08-20 DIAGNOSIS — R778 Other specified abnormalities of plasma proteins: Secondary | ICD-10-CM | POA: Diagnosis present

## 2021-08-20 DIAGNOSIS — I169 Hypertensive crisis, unspecified: Secondary | ICD-10-CM

## 2021-08-20 DIAGNOSIS — Z881 Allergy status to other antibiotic agents status: Secondary | ICD-10-CM | POA: Diagnosis not present

## 2021-08-20 DIAGNOSIS — Z91048 Other nonmedicinal substance allergy status: Secondary | ICD-10-CM | POA: Diagnosis not present

## 2021-08-20 DIAGNOSIS — F32A Depression, unspecified: Secondary | ICD-10-CM | POA: Diagnosis not present

## 2021-08-20 DIAGNOSIS — Z79899 Other long term (current) drug therapy: Secondary | ICD-10-CM

## 2021-08-20 DIAGNOSIS — D696 Thrombocytopenia, unspecified: Secondary | ICD-10-CM | POA: Diagnosis present

## 2021-08-20 DIAGNOSIS — T8612 Kidney transplant failure: Secondary | ICD-10-CM | POA: Diagnosis present

## 2021-08-20 DIAGNOSIS — R7989 Other specified abnormal findings of blood chemistry: Secondary | ICD-10-CM

## 2021-08-20 DIAGNOSIS — E875 Hyperkalemia: Secondary | ICD-10-CM

## 2021-08-20 DIAGNOSIS — I161 Hypertensive emergency: Secondary | ICD-10-CM

## 2021-08-20 DIAGNOSIS — Z992 Dependence on renal dialysis: Secondary | ICD-10-CM | POA: Diagnosis not present

## 2021-08-20 LAB — COMPREHENSIVE METABOLIC PANEL
ALT: 16 U/L (ref 0–44)
ALT: 16 U/L (ref 0–44)
AST: 23 U/L (ref 15–41)
AST: 23 U/L (ref 15–41)
Albumin: 3.5 g/dL (ref 3.5–5.0)
Albumin: 3.4 g/dL — ABNORMAL LOW (ref 3.5–5.0)
Alkaline Phosphatase: 46 U/L (ref 38–126)
Alkaline Phosphatase: 56 U/L (ref 38–126)
Anion gap: 15 (ref 5–15)
Anion gap: 16 — ABNORMAL HIGH (ref 5–15)
BUN: 52 mg/dL — ABNORMAL HIGH (ref 6–20)
BUN: 53 mg/dL — ABNORMAL HIGH (ref 6–20)
CO2: 24 mmol/L (ref 22–32)
CO2: 26 mmol/L (ref 22–32)
Calcium: 10.1 mg/dL (ref 8.9–10.3)
Calcium: 10.1 mg/dL (ref 8.9–10.3)
Chloride: 95 mmol/L — ABNORMAL LOW (ref 98–111)
Chloride: 94 mmol/L — ABNORMAL LOW (ref 98–111)
Creatinine, Ser: 13.66 mg/dL — ABNORMAL HIGH (ref 0.44–1.00)
Creatinine, Ser: 13.6 mg/dL — ABNORMAL HIGH (ref 0.44–1.00)
GFR, Estimated: 3 mL/min — ABNORMAL LOW (ref >60)
GFR, Estimated: 3 mL/min — ABNORMAL LOW (ref >60)
Glucose, Bld: 87 mg/dL (ref 70–99)
Glucose, Bld: 126 mg/dL — ABNORMAL HIGH (ref 70–99)
Potassium: 6 mmol/L — ABNORMAL HIGH (ref 3.5–5.1)
Potassium: 4.2 mmol/L (ref 3.5–5.1)
Sodium: 134 mmol/L — ABNORMAL LOW (ref 135–145)
Sodium: 136 mmol/L (ref 135–145)
Total Bilirubin: 0.9 mg/dL (ref 0.3–1.2)
Total Bilirubin: 0.9 mg/dL (ref 0.3–1.2)
Total Protein: 7 g/dL (ref 6.5–8.1)
Total Protein: 7.2 g/dL (ref 6.5–8.1)

## 2021-08-20 LAB — CBC
HCT: 32 % — ABNORMAL LOW (ref 36.0–46.0)
HCT: 31.2 % — ABNORMAL LOW (ref 36.0–46.0)
Hemoglobin: 10.6 g/dL — ABNORMAL LOW (ref 12.0–15.0)
Hemoglobin: 10.3 g/dL — ABNORMAL LOW (ref 12.0–15.0)
MCH: 33.2 pg (ref 26.0–34.0)
MCH: 32.4 pg (ref 26.0–34.0)
MCHC: 33.1 g/dL (ref 30.0–36.0)
MCHC: 33 g/dL (ref 30.0–36.0)
MCV: 100.3 fL — ABNORMAL HIGH (ref 80.0–100.0)
MCV: 98.1 fL (ref 80.0–100.0)
Platelets: 149 10*3/uL — ABNORMAL LOW (ref 150–400)
Platelets: 156 10*3/uL (ref 150–400)
RBC: 3.19 MIL/uL — ABNORMAL LOW (ref 3.87–5.11)
RBC: 3.18 MIL/uL — ABNORMAL LOW (ref 3.87–5.11)
RDW: 21 % — ABNORMAL HIGH (ref 11.5–15.5)
RDW: 20.5 % — ABNORMAL HIGH (ref 11.5–15.5)
WBC: 7.3 10*3/uL (ref 4.0–10.5)
WBC: 8.5 10*3/uL (ref 4.0–10.5)
nRBC: 0 % (ref 0.0–0.2)
nRBC: 0 % (ref 0.0–0.2)

## 2021-08-20 LAB — BRAIN NATRIURETIC PEPTIDE: B Natriuretic Peptide: 3136.7 pg/mL — ABNORMAL HIGH (ref 0.0–100.0)

## 2021-08-20 LAB — I-STAT CHEM 8, ED
BUN: 48 mg/dL — ABNORMAL HIGH (ref 6–20)
Calcium, Ion: 1.17 mmol/L (ref 1.15–1.40)
Chloride: 98 mmol/L (ref 98–111)
Creatinine, Ser: 15.3 mg/dL — ABNORMAL HIGH (ref 0.44–1.00)
Glucose, Bld: 80 mg/dL (ref 70–99)
HCT: 37 % (ref 36.0–46.0)
Hemoglobin: 12.6 g/dL (ref 12.0–15.0)
Potassium: 6 mmol/L — ABNORMAL HIGH (ref 3.5–5.1)
Sodium: 133 mmol/L — ABNORMAL LOW (ref 135–145)
TCO2: 28 mmol/L (ref 22–32)

## 2021-08-20 LAB — GLUCOSE, CAPILLARY
Glucose-Capillary: 111 mg/dL — ABNORMAL HIGH (ref 70–99)
Glucose-Capillary: 60 mg/dL — ABNORMAL LOW (ref 70–99)
Glucose-Capillary: 94 mg/dL (ref 70–99)
Glucose-Capillary: 91 mg/dL (ref 70–99)
Glucose-Capillary: 75 mg/dL (ref 70–99)

## 2021-08-20 LAB — TROPONIN I (HIGH SENSITIVITY)
Troponin I (High Sensitivity): 116 ng/L (ref <18)
Troponin I (High Sensitivity): 126 ng/L (ref <18)

## 2021-08-20 LAB — I-STAT BETA HCG BLOOD, ED (MC, WL, AP ONLY): I-stat hCG, quantitative: <5 m[IU]/mL (ref <5)

## 2021-08-20 LAB — ECHOCARDIOGRAM COMPLETE
AV Peak grad: 15.6 mmHg
Ao pk vel: 1.98 m/s
Height: 60 in
S' Lateral: 2.4 cm
Weight: 2088.2 oz

## 2021-08-20 LAB — RESP PANEL BY RT-PCR (FLU A&B, COVID) ARPGX2
Influenza A by PCR: NEGATIVE
Influenza B by PCR: NEGATIVE
SARS Coronavirus 2 by RT PCR: NEGATIVE

## 2021-08-20 LAB — MAGNESIUM: Magnesium: 2.6 mg/dL — ABNORMAL HIGH (ref 1.7–2.4)

## 2021-08-20 LAB — PHOSPHORUS: Phosphorus: 6.7 mg/dL — ABNORMAL HIGH (ref 2.5–4.6)

## 2021-08-20 LAB — HEPATITIS B SURFACE ANTIGEN: Hepatitis B Surface Ag: NONREACTIVE

## 2021-08-20 LAB — LIPASE, BLOOD: Lipase: 27 U/L (ref 11–51)

## 2021-08-20 LAB — MRSA NEXT GEN BY PCR, NASAL: MRSA by PCR Next Gen: NOT DETECTED

## 2021-08-20 LAB — HEPATITIS B SURFACE ANTIBODY,QUALITATIVE: Hep B S Ab: REACTIVE — AB

## 2021-08-20 LAB — ETHANOL: Alcohol, Ethyl (B): <10 mg/dL (ref <10)

## 2021-08-20 LAB — POTASSIUM: Potassium: 3.5 mmol/L (ref 3.5–5.1)

## 2021-08-20 MED ORDER — CALCIUM GLUCONATE-NACL 1-0.675 GM/50ML-% IV SOLN
1.0000 g | Freq: Once | INTRAVENOUS | Status: AC
  Administered 2021-08-20: 1000 mg via INTRAVENOUS
  Filled 2021-08-20: qty 50

## 2021-08-20 MED ORDER — ALUM & MAG HYDROXIDE-SIMETH 200-200-20 MG/5ML PO SUSP: 30.0000 mL | Freq: Once | ORAL | Status: DC

## 2021-08-20 MED ORDER — CARVEDILOL 12.5 MG PO TABS
25.0000 mg | ORAL_TABLET | Freq: Two times a day (BID) | ORAL | Status: DC
Start: 2021-08-20 — End: 2021-08-20

## 2021-08-20 MED ORDER — PANTOPRAZOLE SODIUM 40 MG PO TBEC
40.0000 mg | DELAYED_RELEASE_TABLET | Freq: Every day | ORAL | Status: DC
Start: 2021-08-20 — End: 2021-08-21

## 2021-08-20 MED ORDER — ACETAMINOPHEN 325 MG PO TABS
650.0000 mg | ORAL_TABLET | Freq: Four times a day (QID) | ORAL | Status: DC | PRN
  Administered 2021-08-20: 650 mg via ORAL
  Filled 2021-08-20: qty 2

## 2021-08-20 MED ORDER — SODIUM BICARBONATE 8.4 % IV SOLN
50.0000 meq | Freq: Once | INTRAVENOUS | Status: AC
  Administered 2021-08-20: 50 meq via INTRAVENOUS
  Filled 2021-08-20: qty 50

## 2021-08-20 MED ORDER — HEPARIN SODIUM (PORCINE) 1000 UNIT/ML DIALYSIS: 2000.0000 [IU] | INTRAMUSCULAR | Status: DC | PRN

## 2021-08-20 MED ORDER — HYDRALAZINE HCL 20 MG/ML IJ SOLN: 10.0000 mg | INTRAMUSCULAR | Status: DC | PRN

## 2021-08-20 MED ORDER — LABETALOL HCL 300 MG PO TABS
300.0000 mg | ORAL_TABLET | Freq: Two times a day (BID) | ORAL | Status: DC
  Administered 2021-08-20 (×2): 300 mg via ORAL
  Filled 2021-08-20 (×2): qty 1

## 2021-08-20 MED ORDER — DEXTROSE 50 % IV SOLN
12.5000 g | INTRAVENOUS | Status: AC
  Administered 2021-08-20: 12.5 g via INTRAVENOUS

## 2021-08-20 MED ORDER — HEPARIN SODIUM (PORCINE) 1000 UNIT/ML DIALYSIS: 1800.0000 [IU] | INTRAMUSCULAR | Status: DC | PRN

## 2021-08-20 MED ORDER — NIFEDIPINE ER OSMOTIC RELEASE 30 MG PO TB24
90.0000 mg | ORAL_TABLET | Freq: Every day | ORAL | Status: DC
  Filled 2021-08-20: qty 3

## 2021-08-20 MED ORDER — IOHEXOL 350 MG/ML SOLN
80.0000 mL | Freq: Once | INTRAVENOUS | Status: AC | PRN
Start: 2021-08-20 — End: 2021-08-20
  Administered 2021-08-20: 80 mL via INTRAVENOUS

## 2021-08-20 MED ORDER — HALOPERIDOL LACTATE 5 MG/ML IJ SOLN
2.0000 mg | Freq: Once | INTRAMUSCULAR | Status: AC
  Administered 2021-08-20: 2 mg via INTRAVENOUS
  Filled 2021-08-20: qty 1

## 2021-08-20 MED ORDER — ALBUTEROL SULFATE HFA 108 (90 BASE) MCG/ACT IN AERS: 2.0000 | INHALATION_SPRAY | RESPIRATORY_TRACT | Status: DC | PRN

## 2021-08-20 MED ORDER — DEXTROSE 50 % IV SOLN
INTRAVENOUS | Status: AC
  Filled 2021-08-20: qty 50

## 2021-08-20 MED ORDER — HEPARIN SODIUM (PORCINE) 5000 UNIT/ML IJ SOLN
5000.0000 [IU] | Freq: Three times a day (TID) | INTRAMUSCULAR | Status: DC
  Administered 2021-08-20: 5000 [IU] via SUBCUTANEOUS
  Filled 2021-08-20: qty 1

## 2021-08-20 MED ORDER — TRAMADOL HCL 50 MG PO TABS
50.0000 mg | ORAL_TABLET | Freq: Two times a day (BID) | ORAL | Status: DC | PRN
  Administered 2021-08-20: 50 mg via ORAL
  Filled 2021-08-20: qty 1

## 2021-08-20 MED ORDER — DEXTROSE 50 % IV SOLN
1.0000 | Freq: Once | INTRAVENOUS | Status: AC
  Administered 2021-08-20: 50 mL via INTRAVENOUS
  Filled 2021-08-20: qty 50

## 2021-08-20 MED ORDER — ALBUTEROL SULFATE (2.5 MG/3ML) 0.083% IN NEBU
10.0000 mg | INHALATION_SOLUTION | Freq: Once | RESPIRATORY_TRACT | Status: DC
  Filled 2021-08-20: qty 12

## 2021-08-20 MED ORDER — POLYETHYLENE GLYCOL 3350 17 G PO PACK: 17.0000 g | PACK | Freq: Every day | ORAL | Status: DC | PRN

## 2021-08-20 MED ORDER — MIRTAZAPINE 15 MG PO TABS
15.0000 mg | ORAL_TABLET | Freq: Every day | ORAL | Status: DC
Start: 2021-08-20 — End: 2021-08-21

## 2021-08-20 MED ORDER — INSULIN ASPART 100 UNIT/ML IV SOLN
5.0000 [IU] | Freq: Once | INTRAVENOUS | Status: AC
  Administered 2021-08-20: 5 [IU] via INTRAVENOUS

## 2021-08-20 MED ORDER — CHLORHEXIDINE GLUCONATE CLOTH 2 % EX PADS
6.0000 | MEDICATED_PAD | Freq: Every day | CUTANEOUS | Status: DC
  Administered 2021-08-20: 6 via TOPICAL

## 2021-08-20 MED ORDER — DOCUSATE SODIUM 100 MG PO CAPS: 100.0000 mg | ORAL_CAPSULE | Freq: Two times a day (BID) | ORAL | Status: DC | PRN

## 2021-08-20 MED ORDER — NICARDIPINE HCL IN NACL 20-0.86 MG/200ML-% IV SOLN
5.0000 mg/h | Freq: Once | INTRAVENOUS | Status: AC
  Administered 2021-08-20: 5 mg/h via INTRAVENOUS
  Filled 2021-08-20: qty 200

## 2021-08-20 MED ORDER — NICARDIPINE HCL IN NACL 20-0.86 MG/200ML-% IV SOLN
3.0000 mg/h | INTRAVENOUS | Status: DC
  Administered 2021-08-20: 12.5 mg/h via INTRAVENOUS
  Administered 2021-08-20: 4 mg/h via INTRAVENOUS
  Filled 2021-08-20 (×3): qty 200

## 2021-08-20 MED ORDER — NIFEDIPINE ER OSMOTIC RELEASE 30 MG PO TB24
90.0000 mg | ORAL_TABLET | Freq: Every day | ORAL | Status: DC
  Administered 2021-08-20: 90 mg via ORAL
  Filled 2021-08-20: qty 3

## 2021-08-20 MED ORDER — ORAL CARE MOUTH RINSE: 15.0000 mL | Freq: Two times a day (BID) | OROMUCOSAL | Status: DC

## 2021-08-20 MED ORDER — LOSARTAN POTASSIUM 50 MG PO TABS
100.0000 mg | ORAL_TABLET | Freq: Every day | ORAL | Status: DC
  Administered 2021-08-20: 100 mg via ORAL
  Filled 2021-08-20: qty 2

## 2021-08-20 MED ORDER — SODIUM ZIRCONIUM CYCLOSILICATE 10 G PO PACK
10.0000 g | PACK | ORAL | Status: AC
  Administered 2021-08-20: 10 g via ORAL
  Filled 2021-08-20: qty 1

## 2021-08-20 NOTE — ED Provider Notes (Signed)
[facility] EMERGENCY DEPARTMENT Provider Note   CSN: [id number] Arrival date & time: 08/20/21  0157    [id number] Arrival date & time: 08/20/21  0157     History  Chief Complaint  Patient presents with   Shortness of Breath   Abdominal Pain    Kerry Cook is a 36 y.o. female.  The history is provided by the patient.  Shortness of Breath Severity:  Severe Onset quality:  Gradual Duration: days. Timing:  Constant Progression:  Unchanged Chronicity:  Recurrent Context: not URI   Relieved by:  Nothing Worsened by:  Nothing Ineffective treatments:  None tried Associated symptoms: abdominal pain   Associated symptoms: no fever   Abdominal Pain Associated symptoms: nausea and shortness of breath   Associated symptoms: no fever   Patient with ESRD MWF at [facility] presents with SOB having had a reduced treatment on Friday secondary to not feeling well.  She also reports not taking her labetolol secondary to same.      Home Medications Prior to Admission medications   Medication Sig Start Date End Date Taking? Authorizing Provider  b complex-vitamin c-folic acid (NEPHRO-VITE) 0.8 MG TABS tablet Take 1 tablet by mouth daily. 10/24/14   [provider]  calcitRIOL (ROCALTROL) 0.25 MCG capsule Take 0.5 mcg by mouth daily after breakfast.    [provider]  carvedilol (COREG) 25 MG tablet Take 25 mg by mouth 2 (two) times daily. 07/27/21   [provider]  Cholecalciferol (VITAMIN D3) 25 MCG (1000 UT) CAPS Take 1,000 Units by mouth in the morning.    [provider]  Cyanocobalamin (VITAMIN B12) 1000 MCG TBCR Take 1,000 mcg by mouth daily.    [provider]  Drospirenone (SLYND) 4 MG TABS Take 1 tablet by mouth daily with breakfast. 12/11/20   Shelly Bombard, MD  fluticasone [facility]) 50 MCG/ACT nasal spray Place 1 spray into both nostrils daily as needed for allergies or rhinitis. 01/07/21   Rehman, Areeg N,  DO  labetalol (NORMODYNE) 300 MG tablet Take 1 tablet (300 mg total) by mouth 2 (two) times daily. Patient taking differently: Take 300 mg by mouth 3 (three) times daily. 05/24/21   Ghimire, Henreitta Leber, MD  losartan (COZAAR) 100 MG tablet Take 1 tablet (100 mg total) by mouth daily. 05/24/21 05/24/22  Ghimire, Henreitta Leber, MD  mirtazapine (REMERON) 15 MG tablet Take 15 mg by mouth in the morning and at bedtime. 11/01/20 11/01/21  [provider]  NIFEdipine (PROCARDIA XL/NIFEDICAL-XL) 90 MG 24 hr tablet Take 1 tablet (90 mg total) by mouth daily. Patient taking differently: Take 90 mg by mouth at bedtime. 12/15/20 08/09/21  Virl Axe, MD  pantoprazole (PROTONIX) 40 MG tablet Take 1 tablet (40 mg total) by mouth at bedtime. 05/24/21   Ghimire, Henreitta Leber, MD  amLODipine (NORVASC) 10 MG tablet Take 1 tablet (10 mg total) by mouth every evening. Patient not taking: No sig reported 09/13/15 12/14/20  [name], MD      Allergies    Tape and Zosyn [piperacillin sod-tazobactam so]    Review of Systems   Review of Systems  Unable to perform ROS: Acuity of condition  Constitutional:  Negative for fever.  HENT:  Negative for facial swelling.   Respiratory:  Positive for shortness of breath.   Gastrointestinal:  Positive for abdominal pain and nausea.  Neurological:  Negative for facial asymmetry.   Physical Exam Updated Vital Signs BP (!) 236/157  Pulse (!) 101    Temp 98.2 F (36.8 C) (Oral)    Resp (!) 23    SpO2 93%  Physical Exam Vitals and nursing note reviewed.  Constitutional:      Appearance: Normal appearance. She is not diaphoretic.  HENT:     Head: Normocephalic and atraumatic.     Nose: Nose normal.  Eyes:     Extraocular Movements: Extraocular movements intact.     Pupils: Pupils are equal, round, and reactive to light.  Cardiovascular:     Rate and Rhythm: Regular rhythm. Tachycardia present.     Pulses: Normal pulses.     Heart sounds: Normal heart sounds.   Pulmonary:     Breath sounds: Wheezing and rales present.  Abdominal:     General: Abdomen is flat. Bowel sounds are normal.     Palpations: Abdomen is soft.     Tenderness: There is no abdominal tenderness. There is no guarding or rebound.     Hernia: No hernia is present.  Musculoskeletal:        General: Normal range of motion.     Cervical back: Normal range of motion and neck supple. No rigidity.     Right lower leg: No edema.     Left lower leg: No edema.  Lymphadenopathy:     Cervical: No cervical adenopathy.  Skin:    General: Skin is warm and dry.     Capillary Refill: Capillary refill takes less than 2 seconds.  Neurological:     General: No focal deficit present.     Mental Status: She is alert and oriented to person, place, and time.  Psychiatric:     Comments: Writhing around on the bed     ED Results / Procedures / Treatments   Labs (all labs ordered are listed, but only abnormal results are displayed) Results for orders placed or performed during the hospital encounter of 08/20/21  Resp Panel by RT-PCR (Flu A&B, Covid) Nasopharyngeal Swab   Specimen: Nasopharyngeal Swab; Nasopharyngeal(NP) swabs in vial transport medium  Result Value Ref Range   SARS Coronavirus 2 by RT PCR NEGATIVE NEGATIVE   Influenza A by PCR NEGATIVE NEGATIVE   Influenza B by PCR NEGATIVE NEGATIVE  Comprehensive metabolic panel  Result Value Ref Range   Sodium 134 (L) 135 - 145 mmol/L   Potassium 6.0 (H) 3.5 - 5.1 mmol/L   Chloride 95 (L) 98 - 111 mmol/L   CO2 24 22 - 32 mmol/L   Glucose, Bld 87 70 - 99 mg/dL   BUN 52 (H) 6 - 20 mg/dL   Creatinine, Ser 13.66 (H) 0.44 - 1.00 mg/dL   Calcium 10.1 8.9 - 10.3 mg/dL   Total Protein 7.0 6.5 - 8.1 g/dL   Albumin 3.5 3.5 - 5.0 g/dL   AST 23 15 - 41 U/L   ALT 16 0 - 44 U/L   Alkaline Phosphatase 46 38 - 126 U/L   Total Bilirubin 0.9 0.3 - 1.2 mg/dL   GFR, Estimated 3 (L) >60 mL/min   Anion gap 15 5 - 15  CBC  Result Value Ref Range    WBC 7.3 4.0 - 10.5 K/uL   RBC 3.19 (L) 3.87 - 5.11 MIL/uL   Hemoglobin 10.6 (L) 12.0 - 15.0 g/dL   HCT 32.0 (L) 36.0 - 46.0 %   MCV 100.3 (H) 80.0 - 100.0 fL   MCH 33.2 26.0 - 34.0 pg   MCHC 33.1 30.0 - 36.0 g/dL  RDW 21.0 (H) 11.5 - 15.5 %   Platelets 149 (L) 150 - 400 K/uL   nRBC 0.0 0.0 - 0.2 %  Brain natriuretic peptide  Result Value Ref Range   B Natriuretic Peptide 3,136.7 (H) 0.0 - 100.0 pg/mL  I-Stat beta hCG blood, ED  Result Value Ref Range   I-stat hCG, quantitative <5.0 <5 mIU/mL   Comment 3          I-stat chem 8, ED (not at [facility] or [facility])  Result Value Ref Range   Sodium 133 (L) 135 - 145 mmol/L   Potassium 6.0 (H) 3.5 - 5.1 mmol/L   Chloride 98 98 - 111 mmol/L   BUN 48 (H) 6 - 20 mg/dL   Creatinine, Ser 15.30 (H) 0.44 - 1.00 mg/dL   Glucose, Bld 80 70 - 99 mg/dL   Calcium, Ion 1.17 1.15 - 1.40 mmol/L   TCO2 28 22 - 32 mmol/L   Hemoglobin 12.6 12.0 - 15.0 g/dL   HCT 37.0 36.0 - 46.0 %  Troponin I (High Sensitivity)  Result Value Ref Range   Troponin I (High Sensitivity) 116 (HH) <18 ng/L   DG Chest 2 View  Result Date: 08/20/2021 CLINICAL DATA:  Dyspnea EXAM: CHEST - 2 VIEW COMPARISON:  08/09/2021 FINDINGS: The lungs are symmetrically well expanded. Mild perihilar interstitial pulmonary infiltrate has developed, most suggestive of a mild perihilar pulmonary edema though airway inflammation in the setting of atypical infection could appear similarly. No pneumothorax or pleural effusion. Mild cardiomegaly is stable. No acute bone abnormality. IMPRESSION: Stable cardiomegaly. Interval development of mild interstitial pulmonary infiltrate most suggestive of mild pulmonary edema, possibly cardiogenic in nature. Electronically Signed   By: Fidela Salisbury M.D.   On: 08/20/2021 02:58   DG Chest 2 View  Result Date: 07/22/2021 CLINICAL DATA:  Shortness of breath EXAM: CHEST - 2 VIEW COMPARISON:  05/21/2021 FINDINGS: Cardiac shadow is enlarged but stable. Diffuse increased  vascular congestion is noted with mild interstitial edema consistent with CHF. This is likely related to fluid overload. No sizable effusion is seen. No bony abnormality is noted. IMPRESSION: Vascular congestion likely related to fluid overload. Electronically Signed   By: Inez Catalina M.D.   On: 07/22/2021 21:25   CT HEAD WO CONTRAST (5MM)  Result Date: 08/08/2021 CLINICAL DATA:  Headache. EXAM: CT HEAD WITHOUT CONTRAST TECHNIQUE: Contiguous axial images were obtained from the base of the skull through the vertex without intravenous contrast. RADIATION DOSE REDUCTION: This exam was performed according to the departmental dose-optimization program which includes automated exposure control, adjustment of the mA and/or kV according to patient size and/or use of iterative reconstruction technique. COMPARISON:  Head CT dated 05/21/2021. FINDINGS: Brain: The ventricles and sulci are appropriate size for the patient's age. The gray-white matter discrimination is preserved. There is no acute intracranial hemorrhage. No mass effect or midline shift. No extra-axial fluid collection. Vascular: No hyperdense vessel or unexpected calcification. Skull: Normal. Negative for fracture or focal lesion. Sinuses/Orbits: No acute finding. Other: None IMPRESSION: No acute intracranial pathology. Electronically Signed   By: Anner Crete M.D.   On: 08/08/2021 22:43   DG Chest Port 1 View  Result Date: 08/09/2021 CLINICAL DATA:  Shortness of breath EXAM: PORTABLE CHEST 1 VIEW COMPARISON:  07/22/2021 FINDINGS: Mild cardiomegaly is unchanged. Both lungs are clear. The visualized skeletal structures are unremarkable. IMPRESSION: Mild cardiomegaly without focal airspace disease. Electronically Signed   By: Ulyses Jarred M.D.   On: 08/09/2021 01:37  CT Angio Chest/Abd/Pel for Dissection W and/or Wo Contrast  Result Date: 08/20/2021 CLINICAL DATA:  Acute aortic syndrome suspected EXAM: CT ANGIOGRAPHY CHEST, ABDOMEN AND PELVIS  TECHNIQUE: Non-contrast CT of the chest was initially obtained. Multidetector CT imaging through the chest, abdomen and pelvis was performed using the standard protocol during bolus administration of intravenous contrast. Multiplanar reconstructed images and MIPs were obtained and reviewed to evaluate the vascular anatomy. RADIATION DOSE REDUCTION: This exam was performed according to the departmental dose-optimization program which includes automated exposure control, adjustment of the mA and/or kV according to patient size and/or use of iterative reconstruction technique. CONTRAST:  19mL OMNIPAQUE IOHEXOL 350 MG/ML SOLN COMPARISON:  Abdomen and pelvis CT with contrast 11/18/2016 FINDINGS: CTA CHEST FINDINGS Cardiovascular: Cardiomegaly with small pericardial effusion. Premature aortic and coronary atherosclerosis. High-density catheter remnant at the SVC. 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 Preferential opacification of the aorta. No aneurysm or dissection. No aortic intramural hematoma. Pulmonary arteries are well opacified and negative for acute pulmonary embolism. Web from remote embolism is seen in the right lower lobe on 7:75 Mediastinum/Nodes: Negative for adenopathy or mass Lungs/Pleura: Airway cuffing and interlobular septal thickening at the bases with mosaic attenuation of the lower chest. Fissural thickening in the lower chest. No pleural effusion or air leak. No lobar consolidation. Reticular density at the left apex is likely scarring. Musculoskeletal: No acute or aggressive finding Review of the MIP images confirms the above findings. CTA ABDOMEN AND PELVIS FINDINGS VASCULAR Aorta: Atheromatous plaque.  No dissection or aneurysm Celiac: Celiac and its branches are widely patent SMA: Vessels are smooth and diffusely patent Renals: Plaque at the right renal artery ostium. Symmetric enhancement other renal arteries without aneurysm or dissection. IMA: Patent Inflow:  Atheromatous plaque without stenosis or dissection. Veins: Unremarkable in the arterial phase Review of the MIP images confirms the above findings. NON-VASCULAR Hepatobiliary: No focal liver abnormality.No evidence of biliary obstruction or stone. Pancreas: Unremarkable. Spleen: Unremarkable. Adrenals/Urinary Tract: Negative adrenals. Severe native renal atrophy. Calcified, failed bilateral renal transplant. Unremarkable bladder. Stomach/Bowel: No obstruction. Appendix is not clearly identified in the absence of bowel wall or endoluminal contrast. Few scattered colonic diverticula. Vascular/Lymphatic: No acute vascular abnormality. No mass or adenopathy. Reproductive:Symmetric large appearance of the ovaries also noted in 2018, with scattered internal calcifications. Other: Trace pelvic fluid, nonspecific in isolation. Musculoskeletal: Sclerotic appearance of the bones diffusely. 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 Review of the MIP images confirms the above findings. IMPRESSION: 1. Negative for acute aortic syndrome. 2. Cardiomegaly and mild pulmonary edema. 3. Atherosclerosis which includes the coronary arteries. 4. Remote pulmonary embolism with web.  No acute pulmonary embolism. Electronically Signed   By: Jorje Guild M.D.   On: 08/20/2021 04:23   CT Orbits W Contrast  Result Date: 08/08/2021 CLINICAL DATA:  Right eye pain and headache EXAM: CT ORBITS WITH CONTRAST TECHNIQUE: Multidetector CT images was performed according to the standard protocol following intravenous contrast administration. RADIATION DOSE REDUCTION: This exam was performed according to the departmental dose-optimization program which includes automated exposure control, adjustment of the mA and/or kV according to patient size and/or use of iterative reconstruction technique. CONTRAST:  28mL OMNIPAQUE IOHEXOL 300 MG/ML  SOLN  COMPARISON:  No pertinent prior exam. FINDINGS: Orbits: The globes are intact. Optic nerves are normal. Normal extraocular muscles and lacrimal glands. The bony orbit, preseptal soft tissues and the intra- and  extraconal orbital fat are normal. Visualized sinuses:  No fluid levels or advanced mucosal thickening. Soft tissues: Normal. Limited intracranial: Normal. IMPRESSION: Normal CT of the orbits. Electronically Signed   By: Ulyses Jarred M.D.   On: 08/08/2021 22:42    EKG None  Radiology DG Chest 2 View  Result Date: 08/20/2021 CLINICAL DATA:  Dyspnea EXAM: CHEST - 2 VIEW COMPARISON:  08/09/2021 FINDINGS: The lungs are symmetrically well expanded. Mild perihilar interstitial pulmonary infiltrate has developed, most suggestive of a mild perihilar pulmonary edema though airway inflammation in the setting of atypical infection could appear similarly. No pneumothorax or pleural effusion. Mild cardiomegaly is stable. No acute bone abnormality. IMPRESSION: Stable cardiomegaly. Interval development of mild interstitial pulmonary infiltrate most suggestive of mild pulmonary edema, possibly cardiogenic in nature. Electronically Signed   By: Fidela Salisbury M.D.   On: 08/20/2021 02:58   CT Angio Chest/Abd/Pel for Dissection W and/or Wo Contrast  Result Date: 08/20/2021 CLINICAL DATA:  Acute aortic syndrome suspected EXAM: CT ANGIOGRAPHY CHEST, ABDOMEN AND PELVIS TECHNIQUE: Non-contrast CT of the chest was initially obtained. Multidetector CT imaging through the chest, abdomen and pelvis was performed using the standard protocol during bolus administration of intravenous contrast. Multiplanar reconstructed images and MIPs were obtained and reviewed to evaluate the vascular anatomy. RADIATION DOSE REDUCTION: This exam was performed according to the departmental dose-optimization program which includes automated exposure control, adjustment of the mA and/or kV according to patient size and/or use of iterative  reconstruction technique. CONTRAST:  76mL OMNIPAQUE IOHEXOL 350 MG/ML SOLN COMPARISON:  Abdomen and pelvis CT with contrast 11/18/2016 FINDINGS: CTA CHEST FINDINGS Cardiovascular: Cardiomegaly with small pericardial effusion. Premature aortic and coronary atherosclerosis. High-density catheter remnant at the SVC. 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 Preferential opacification of the aorta. No aneurysm or dissection. No aortic intramural hematoma. Pulmonary arteries are well opacified and negative for acute pulmonary embolism. Web from remote embolism is seen in the right lower lobe on 7:75 Mediastinum/Nodes: Negative for adenopathy or mass Lungs/Pleura: Airway cuffing and interlobular septal thickening at the bases with mosaic attenuation of the lower chest. Fissural thickening in the lower chest. No pleural effusion or air leak. No lobar consolidation. Reticular density at the left apex is likely scarring. Musculoskeletal: No acute or aggressive finding Review of the MIP images confirms the above findings. CTA ABDOMEN AND PELVIS FINDINGS VASCULAR Aorta: Atheromatous plaque.  No dissection or aneurysm Celiac: Celiac and its branches are widely patent SMA: Vessels are smooth and diffusely patent Renals: Plaque at the right renal artery ostium. Symmetric enhancement other renal arteries without aneurysm or dissection. IMA: Patent Inflow: Atheromatous plaque without stenosis or dissection. Veins: Unremarkable in the arterial phase Review of the MIP images confirms the above findings. NON-VASCULAR Hepatobiliary: No focal liver abnormality.No evidence of biliary obstruction or stone. Pancreas: Unremarkable. Spleen: Unremarkable. Adrenals/Urinary Tract: Negative adrenals. Severe native renal atrophy. Calcified, failed bilateral renal transplant. Unremarkable bladder. Stomach/Bowel: No obstruction. Appendix is not clearly identified in the absence of bowel wall or endoluminal contrast. Few  scattered colonic diverticula. Vascular/Lymphatic: No acute vascular abnormality. No mass or adenopathy. Reproductive:Symmetric large appearance of the ovaries also noted in 2018, with scattered internal calcifications. Other: Trace pelvic fluid, nonspecific in isolation. Musculoskeletal: Sclerotic appearance of the bones diffusely. 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 Review of the MIP images confirms the above findings. IMPRESSION: 1. Negative for acute aortic syndrome. 2.  Cardiomegaly and mild pulmonary edema. 3. Atherosclerosis which includes the coronary arteries. 4. Remote pulmonary embolism with web.  No acute pulmonary embolism. Electronically Signed   By: Jorje Guild M.D.   On: 08/20/2021 04:23    Procedures Procedures    Medications Ordered in ED Medications  alum & mag hydroxide-simeth (MAALOX/MYLANTA) 200-200-20 MG/5ML suspension 30 mL (30 mLs Oral Patient Refused/Not Given 08/20/21 0400)  albuterol (PROVENTIL) (2.5 MG/3ML) 0.083% nebulizer solution 10 mg (has no administration in time range)  calcium gluconate 1 g/ 50 mL sodium chloride IVPB (has no administration in time range)  insulin aspart (novoLOG) injection 5 Units (has no administration in time range)    And  dextrose 50 % solution 50 mL (has no administration in time range)  nicardipine (CARDENE) 20mg  in 0.86% saline 244ml IV infusion (0.1 mg/ml) (10 mg/hr Intravenous Rate/Dose Change 08/20/21 0447)  sodium zirconium cyclosilicate (LOKELMA) packet 10 g (10 g Oral Given 08/20/21 0443)  sodium bicarbonate injection 50 mEq (50 mEq Intravenous Given 08/20/21 0443)  iohexol (OMNIPAQUE) 350 MG/ML injection 80 mL (80 mLs Intravenous Contrast Given 08/20/21 0405)  haloperidol lactate (HALDOL) injection 2 mg (2 mg Intravenous Given 08/20/21 0445)    EKG Interpretation  Date/Time:  Monday August 20 2021 02:08:29 EST Ventricular  Rate:  106 PR Interval:  134 QRS Duration: 78 QT Interval:  354 QTC Calculation: 470 R Axis:   -3 Text Interpretation: Sinus tachycardia Right atrial enlargement Marked ST abnormality, possible inferior subendocardial injury Abnormal ECG When compared with ECG of 08-Aug-2021 22:22, PREVIOUS ECG IS PRESENT Confirmed by Dory Horn) on 08/20/2021 4:55:42 AM         ED Course/ Medical Decision Making/ A&P                           Medical Decision Making Patient did not get a full dialysis treatment on Friday because she was not feeling well   Problems Addressed: Elevated troponin I level: complicated acute illness or injury    Details: On carden drip will need repeat troponins Hyperkalemia: acute illness or injury    Details: treating with hyperkalemia protocol Hypertensive crisis: acute illness or injury    Details: On cardene drip  Amount and/or Complexity of Data Reviewed Independent Historian: spouse    Details: see above External Data Reviewed: labs, radiology, ECG and notes.    Details: See epic" I have reviewed from previous ED visits and previous admissions Labs: ordered.    Details: All labs reviewed by me: troponin is elevated at 113, BNP is elevated at 3136.7, Patient has an elevated BUN and creatinine and is hyperkalemic to 6.0, platelets are slightly low at 149,000.  Flu and covid are negative Radiology: ordered.    Details: CT dissection reviewed by me and is negative for dissection or acute abdominal pathology ECG/medicine tests: ordered and independent interpretation performed. Decision-making details documented in ED Course.    Details: see note Discussion of management or test interpretation with external provider(s): 442 Case d/w Dr. Ilda Mori, they will see the patient for admission  447 Case d/w Dr. Jonnie Finner will be seen by nephrology agrees with treatment   Risk OTC drugs. Prescription drug management. Decision regarding hospitalization. Risk  Details: Patient is on a carden drip and will need admission to the ICU and will also need dialysis.  Hyperkalemia treatment was initiated   Critical Care Total time providing critical care: 75-105 minutes (cardene drip and hyperkalemia treatment) MDM  Reviewed: previous chart, nursing note and vitals Reviewed previous: labs, ECG and x-ray Interpretation: ECG, labs and CT scan Total time providing critical care: 75-105 minutes (cardene drip and hyperkalemia treatment). This excludes time spent performing separately reportable procedures and services. Consults: critical care   CRITICAL CARE Performed by: Tomasita Beevers K Jon Lall-Rasch Total critical care time: 90 minutes Critical care time was exclusive of separately billable procedures and treating other patients. Critical care was necessary to treat or prevent imminent or life-threatening deterioration. Critical care was time spent personally by me on the following activities: development of treatment plan with patient and/or surrogate as well as nursing, discussions with consultants, evaluation of patient's response to treatment, examination of patient, obtaining history from patient or surrogate, ordering and performing treatments and interventions, ordering and review of laboratory studies, ordering and review of radiographic studies, pulse oximetry and re-evaluation of patient's condition.  Final Clinical Impression(s) / ED Diagnoses Final diagnoses:  Hypertensive crisis  Hyperkalemia  Elevated troponin I level    The patient appears reasonably stabilized for admission considering the current resources, flow, and capabilities available in the ED at this time, and I doubt any other [facility] requiring further screening and/or treatment in the ED prior to admission.     Kalah Pflum, MD 08/20/21 [phone]

## 2021-08-20 NOTE — Plan of Care (Signed)

## 2021-08-20 NOTE — Progress Notes (Signed)
Pt refusing neb tx, RN explained the importance of bringing her potassium down. RT back to room to confirm again that pt did not want tx, pt now requesting to check out. RN notified.

## 2021-08-20 NOTE — Progress Notes (Signed)
Date and time results received: 08/20/21 6:38 AM  (use smartphrase ".now" to insert current time)  Test: CBG Critical Value: 60  Name of Provider Notified: E-link  Orders Received? Or Actions Taken?:

## 2021-08-20 NOTE — Plan of Care (Signed)
°  Problem: Clinical Measurements: Goal: Respiratory complications will improve Outcome: Progressing Goal: Cardiovascular complication will be avoided Outcome: Progressing   Problem: Clinical Measurements: Goal: Diagnostic test results will improve Outcome: Not Progressing   Problem: Pain Managment: Goal: General experience of comfort will improve Outcome: Not Progressing Note: Leg pain/spasms. PRN pain medication given per MD.   Problem: Elimination: Goal: Will not experience complications related to urinary retention Outcome: Not Applicable

## 2021-08-20 NOTE — Consult Note (Signed)
Renal Service Consult Note [facility]  Kerry Cook 08/20/2021 [name], MD Requesting Physician: Dr Milon Dikes, A.   Reason for Consult: ESRD pt w/ uncont HTN HPI: The patient is a 36 y.o. year-old w/ hx of HTN, failed renal transplant x 2, anemia ckd/ other, ESRD on HD MWF presented to ED early this morning c/o SOB and abd pain, no N/V.  In ED BP very high ~ 225/ 160 and K+ up at 6.0.  IV Cardene was started and also given temporizing measures for hyperkalemia. Also rec'd haldol for general agitation. CTA chest neg for dissection, +mild pulm edema, no PE.  CXR vasc congestion, CM. Asked to see for dialysis.   Pt seen in ED. Restless, c/o legs "shaking". No CP, no SOB, nasal O2. Family at bedside says she has not missed HD. Has been having "BP problems" the past month or so. Taking her BP meds except for the last 24 hrs.    Admitted June and July 2022 for anemia  Last admit here Dec 2022 for uncont HTN   ROS - denies CP, no joint pain, no HA, no blurry vision, no rash, no diarrhea, no nausea/ vomiting, no dysuria, no difficulty voiding   Past Medical History  Past Medical History:  Diagnosis Date   ESRD on hemodialysis ([place])    started HD in 2010, has had kidney transplant x 2. 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 and is back on HD MWF   History of kidney transplant    1st transplant was 2012- 2015, 2nd transplant was 2018- 2022 approx   Hypertension    Multiple gastric ulcers    Pneumonia 01/23/2015   Past Surgical History  Past Surgical History:  Procedure Laterality Date   ESOPHAGOGASTRODUODENOSCOPY N/A 04/01/2016   Procedure: ESOPHAGOGASTRODUODENOSCOPY (EGD);  Surgeon: Gatha Mayer, MD;  Location: [facility] ENDOSCOPY;  Service: Endoscopy;  Laterality: N/A;   ESOPHAGOGASTRODUODENOSCOPY (EGD) WITH PROPOFOL N/A 11/20/2016   Procedure: ESOPHAGOGASTRODUODENOSCOPY (EGD) WITH PROPOFOL;  Surgeon: Doran Stabler, MD;  Location: [place];  Service: Endoscopy;  Laterality:  N/A;   KIDNEY TRANSPLANT Bilateral 2012   Family History  Family History  Problem Relation Age of Onset   Kidney disease Father    Lupus Maternal Grandmother    Cancer Maternal Grandfather    Colon cancer Neg Hx    Stomach cancer Neg Hx    Rectal cancer Neg Hx    Esophageal cancer Neg Hx    Liver cancer Neg Hx    Social History  reports that she has never smoked. She has never used smokeless tobacco. She reports that she does not drink alcohol and does not use drugs. Allergies  Allergies  Allergen Reactions   Tape Itching and Other (See Comments)    Plastic/clear tape tears up the patient's skin   Zosyn [Piperacillin Sod-Tazobactam So] Itching    Has patient had a PCN reaction causing immediate rash, facial/tongue/throat swelling, SOB or lightheadedness with hypotension: No Has patient had a PCN reaction causing severe rash involving mucus membranes or skin necrosis: No Has patient had a PCN reaction that required hospitalization: No Has patient had a PCN reaction occurring within the last 10 years: No If all of the above answers are "NO", then may proceed with Cephalosporin use.    Home medications Prior to Admission medications   Medication Sig Start Date End Date Taking? Authorizing Provider  b complex-vitamin c-folic acid (NEPHRO-VITE) 0.8 MG TABS tablet Take 1 tablet by mouth daily. 10/24/14  [provider]  calcitRIOL (ROCALTROL) 0.25 MCG capsule Take 0.5 mcg by mouth daily after breakfast.    [provider]  carvedilol (COREG) 25 MG tablet Take 25 mg by mouth 2 (two) times daily. 07/27/21   [provider]  Cholecalciferol (VITAMIN D3) 25 MCG (1000 UT) CAPS Take 1,000 Units by mouth in the morning.    [provider]  Cyanocobalamin (VITAMIN B12) 1000 MCG TBCR Take 1,000 mcg by mouth daily.    [provider]  Drospirenone (SLYND) 4 MG TABS Take 1 tablet by mouth daily with breakfast. 12/11/20   Shelly Bombard, MD  fluticasone  [facility]) 50 MCG/ACT nasal spray Place 1 spray into both nostrils daily as needed for allergies or rhinitis. 01/07/21   Rehman, Areeg N, DO  labetalol (NORMODYNE) 300 MG tablet Take 1 tablet (300 mg total) by mouth 2 (two) times daily. Patient taking differently: Take 300 mg by mouth 3 (three) times daily. 05/24/21   Ghimire, Henreitta Leber, MD  losartan (COZAAR) 100 MG tablet Take 1 tablet (100 mg total) by mouth daily. 05/24/21 05/24/22  Ghimire, Henreitta Leber, MD  mirtazapine (REMERON) 15 MG tablet Take 15 mg by mouth in the morning and at bedtime. 11/01/20 11/01/21  [provider]  NIFEdipine (PROCARDIA XL/NIFEDICAL-XL) 90 MG 24 hr tablet Take 1 tablet (90 mg total) by mouth daily. Patient taking differently: Take 90 mg by mouth at bedtime. 12/15/20 08/09/21  Virl Axe, MD  pantoprazole (PROTONIX) 40 MG tablet Take 1 tablet (40 mg total) by mouth at bedtime. 05/24/21   Ghimire, Henreitta Leber, MD  amLODipine (NORVASC) 10 MG tablet Take 1 tablet (10 mg total) by mouth every evening. Patient not taking: No sig reported 09/13/15 12/14/20  [name], MD     Vitals:   08/20/21 0310 08/20/21 0315 08/20/21 0445 08/20/21 0500  BP: (!) 233/147 (!) 227/161 (!) 236/157 (!) 253/210  Pulse: 97 97 (!) 101 (!) 106  Resp: 19 (!) 22 (!) 23 11  Temp:      TempSrc:      SpO2: 99% 100% 93% 94%   Exam Gen alert, no distress No rash, cyanosis or gangrene Sclera anicteric, throat clear  No jvd or bruits Chest clear bilat to bases, no rales/ wheezing RRR no MRG Abd soft ntnd no mass or ascites +bs GU normal MS no joint effusions or deformity Ext no LE or UE edema, no wounds or ulcers Neuro is alert, Ox 3 , nf      OP HD: MWF AF  From dec 2022 needs updating >> 3h 39min  400/500  Hep 3500  LFA AVF     Assessment/ Plan: SOB/ mild pulm edema - due to severe uncont HTN. Not vol overloaded on exam, not in distress. On IV cardene in ED. Will plan HD today when pt is in ICU bed.  Hyperkalemia - K  6.0, address w/ HD today Uncont HTN - missed home meds x 24 hrs, severe HTN in ED 230/ 160. Getting IV cardene and BP's coming down now.  ESRD - usual HD MWF. HD today.  H/o failed renal transplant x 2 - off of all IS now Anema ckd - Hb 10.6, follow MBD ckd - Ca up, get records      Kelly Splinter  MD 08/20/2021, 5:33 AM Recent Labs  Lab 08/20/21 0310 08/20/21 0330  HGB 10.6* 12.6  ALBUMIN 3.5  --   CALCIUM 10.1  --   CREATININE 13.66* 15.30*  K  6.0* 6.0*  ° ° °

## 2021-08-20 NOTE — Progress Notes (Signed)
Echocardiogram 2D Echocardiogram has been performed.  Kerry Cook 08/20/2021, 9:24 AM

## 2021-08-20 NOTE — ED Triage Notes (Addendum)
Pt reported to ED with c/o shortness of breath and abdominal pain. Reports being a hemodialysis pt, receives treatment on M-W-F. Pt states that similar episodes are common the day prior to treatment. Also reports pain to abdomen. States she has internal abominal wounds that cause her abdominal pain. Denies any N/V. Is left arm restrict d/t hemodialysis fistula.

## 2021-08-20 NOTE — Progress Notes (Signed)
Seen in room. Awaiting dialysis.   Still reports her legs "shaking". Expect to improve post dialysis. Please see full c/s note by Dr Jonnie Finner earlier this AM  Kerry Lips MD [place] [facility] [phone]

## 2021-08-20 NOTE — Discharge Summary (Signed)
Physician Discharge Summary      Patient ID: RYIAN LYNDE MRN: [id number] DOB/AGE: 1986-04-13 36 y.o.  Admit date: 08/20/2021 Discharge date: 08/20/2021  Discharge Diagnoses:   Hypertensive emergency: Hx of HTN:  AKI on ESRD  Hyperkalemia Mild Pulmonary Edema Elevated troponin Elevated BNP Anemia of chronic ESRD Thrombocytopenia Abdominal pain Hx of Gastric ulcers Hx of depression  Discharge summary   Patient is a 36 year old female with pertinent PMH of ESRD (HD MWF; failed kidney transplant), HTN, and gastric ulcers presents to [facility] ED on 2/27 with SOB/abdominal discomfort.  Patient states her SOB/abdominal discomfort has been getting progressively worse over the past few days.  She states that she missed her dialysis last Friday on 2/24.  She also states that she is not very compliant with her BP meds.   On arrival to [facility] ED on 2/27, patient BP initially 235/148.  Patient sats stable on room air.  CXR showing mild bilateral pulmonary edema with cardiomegaly.  Troponin initially 116.  BNP 3136.  Patient with diffuse abdominal discomfort.  CT abdomen/pelvis negative.  Patient's K 6.0.  Given calcium, insulin/dextrose, Lokelma, bicarb.  Nephro consulted for HD.  Patient started on Cardene drip gtt. PCCM consulted for ICU admission.  By morning of 2/27 Cardend drip had almost been weaned off but that afternoon patient received HD and hemodynamics remained stable.She continues to tolerate diet and ambulated per baseline, She will be discharged home in stable condition.    Discharge Plan by Active Problems   Hypertensive emergency:  -Missed HD treatment last Friday and non compliant with medications Hx of HTN -SBP normally in 160s P: Home medications were resumed in combination with HD resulting in appropriate hemodynamic control  Evening Addendum: Discussed home medications with patient - has been on both labetolol and Coreg as outpt for last month, tolerating well. Reviewed  sure scripts and has both filled. Reports outpatient nephrologist is managing. We discussed concerns for dual beta blocker RX, she expressed understanding will follow with nephrologist this week regarding plan for transition vs. Adjustment. Has outpt HD on Wednesday.    AKI on ESRD  -On HD MWF; Missed HD treatment last Friday; left arm fistula; baseline creat 7.8?; hx of failed kidney transplant Hyperkalemia P: Hyperkalemia resolved with temporizing measures  Tolerated HD 2/27    Mild Pulmonary Edema -resolved  Elevated troponin  -Likely stress related  Elevated BNP P: Weaned off supplemental oxygen  ECHO with preserved EF of 60-68% and severe concentric left ventricular hypotrophy and indeterminate diastolic pressure   Continues to deny chest pain    Anemia of chronic ESRD Thrombocytopenia P: Outpatient follow up    Abdominal pain Hx of Gastric ulcers -Ct abdomen negative -LFTs and lipase normal  P: Supportive care    Hx of depression P: Continue home Mirtazapine   Significant Hospital tests/ studies  CXR with mild pulmonary edema  CTA chest/ABD/Pelvis negative   Procedures   None  Culture data/antimicrobials   None   Consults  Nephrology     Discharge Exam: BP (!) 159/100    Pulse 84    Temp 97.9 F (36.6 C) (Oral)    Resp 20    Ht 5' (1.524 m)    Wt 59.2 kg    SpO2 97%    BMI 25.49 kg/m   General: Well appearing adult female lying in bed in NAD HEENT: [place]/AT, MM pink/moist, PERRL,  Neuro: Alert and oriented x3, non-focal  CV: s1s2 regular rate and rhythm, no murmur,  rubs, or gallops,  PULM:  Clear to ascultation, no increased work of breathing GI: soft, bowel sounds active in all 4 quadrants, non-tender, non-distended, tolerating oral diet Extremities: warm/dry, no edema  Skin: no rashes or lesions  Labs at discharge   Lab Results  Component Value Date   CREATININE 13.60 (H) 08/20/2021   BUN 53 (H) 08/20/2021   NA 136 08/20/2021   K 4.2  08/20/2021   CL 94 (L) 08/20/2021   CO2 26 08/20/2021   Lab Results  Component Value Date   WBC 8.5 08/20/2021   HGB 10.3 (L) 08/20/2021   HCT 31.2 (L) 08/20/2021   MCV 98.1 08/20/2021   PLT 156 08/20/2021   Lab Results  Component Value Date   ALT 16 08/20/2021   AST 23 08/20/2021   ALKPHOS 56 08/20/2021   BILITOT 0.9 08/20/2021   Lab Results  Component Value Date   INR 1.0 08/08/2021   INR 1.0 09/03/2007   INR 1.0 09/02/2007    Current radiological studies    DG Chest 2 View  Result Date: 08/20/2021 CLINICAL DATA:  Dyspnea EXAM: CHEST - 2 VIEW COMPARISON:  08/09/2021 FINDINGS: The lungs are symmetrically well expanded. Mild perihilar interstitial pulmonary infiltrate has developed, most suggestive of a mild perihilar pulmonary edema though airway inflammation in the setting of atypical infection could appear similarly. No pneumothorax or pleural effusion. Mild cardiomegaly is stable. No acute bone abnormality. IMPRESSION: Stable cardiomegaly. Interval development of mild interstitial pulmonary infiltrate most suggestive of mild pulmonary edema, possibly cardiogenic in nature. Electronically Signed   By: Fidela Salisbury M.D.   On: 08/20/2021 02:58   ECHOCARDIOGRAM COMPLETE  Result Date: 08/20/2021    ECHOCARDIOGRAM REPORT   Patient Name:   LARRI BREWTON [facility] Date of Exam: 08/20/2021 Medical Rec #:  [id number]          Height:       60.0 in Accession #:    [id number]         Weight:       130.5 lb Date of Birth:  1985-12-16           BSA:          1.557 m Patient Age:    71 years           BP:           00/00 mmHg Patient Gender: F                  HR:           110 bpm. Exam Location:  Inpatient Procedure: 2D Echo Indications:    Elevated troponin  History:        Patient has prior history of Echocardiogram examinations, most                 recent 08/26/2007. Risk Factors:Hypertension.  Sonographer:    Jefferey Pica Referring Phys: [id number] [place]  1. Left ventricular  ejection fraction, by estimation, is 60 to 65%. The left ventricle has normal function. The left ventricle has no regional wall motion abnormalities. There is severe concentric left ventricular hypertrophy. Indeterminate diastolic filling due to E-A fusion.  2. Right ventricular systolic function is normal. The right ventricular size is normal. There is moderately elevated pulmonary artery systolic pressure. The estimated right ventricular systolic pressure is 32.4 mmHg.  3. Left atrial size was severely dilated.  4. There is a moderate, circumferential pericardial effusion that is largest around  the RA.Marland Kitchen There is no evidence of cardiac tamponade.  5. The mitral valve is abnormal. Trivial mitral valve regurgitation.  6. The aortic valve is tricuspid. Aortic valve regurgitation is not visualized. No aortic stenosis is present.  7. Aortic dilatation noted. There is borderline dilatation of the ascending aorta, measuring 36 mm.  8. The inferior vena cava is dilated in size with <50% respiratory variability, suggesting right atrial pressure of 15 mmHg. Comparison(s): Compared to prior TTE report in 2009, there is no significant change. FINDINGS  Left Ventricle: Left ventricular ejection fraction, by estimation, is 60 to 65%. The left ventricle has normal function. The left ventricle has no regional wall motion abnormalities. The left ventricular internal cavity size was normal in size. There is  severe concentric left ventricular hypertrophy. Indeterminate diastolic filling due to E-A fusion. Right Ventricle: The right ventricular size is normal. No increase in right ventricular wall thickness. Right ventricular systolic function is normal. There is moderately elevated pulmonary artery systolic pressure. The tricuspid regurgitant velocity is 3.35 m/s, and with an assumed right atrial pressure of 15 mmHg, the estimated right ventricular systolic pressure is 86.5 mmHg. Left Atrium: Left atrial size was severely dilated.  Right Atrium: Right atrial size was normal in size. Pericardium: There is a moderate, circumferential pericardial effusion that is largest around the RA. There is no evidence of cardiac tamponade. Mitral Valve: The mitral valve is abnormal. There is mild thickening of the mitral valve leaflet(s). There is mild calcification of the mitral valve leaflet(s). Trivial mitral valve regurgitation. Tricuspid Valve: The tricuspid valve is normal in structure. Tricuspid valve regurgitation is mild. Aortic Valve: The aortic valve is tricuspid. Aortic valve regurgitation is not visualized. No aortic stenosis is present. Aortic valve peak gradient measures 15.6 mmHg. Pulmonic Valve: The pulmonic valve was normal in structure. Pulmonic valve regurgitation is trivial. Aorta: Aortic dilatation noted. There is borderline dilatation of the ascending aorta, measuring 36 mm. Venous: The inferior vena cava is dilated in size with less than 50% respiratory variability, suggesting right atrial pressure of 15 mmHg. IAS/Shunts: The atrial septum is grossly normal.  LEFT VENTRICLE PLAX 2D LVIDd:         4.70 cm Diastology LVIDs:         2.40 cm LV e' lateral: 8.70 cm/s LV PW:         1.80 cm LV IVS:        2.10 cm  RIGHT VENTRICLE             IVC RV Basal diam:  2.70 cm     IVC diam: 2.10 cm RV S prime:     15.20 cm/s TAPSE (M-mode): 2.1 cm LEFT ATRIUM              Index        RIGHT ATRIUM           Index LA diam:        4.90 cm  3.15 cm/m   RA Area:     12.20 cm LA Vol (A2C):   129.0 ml 82.86 ml/m  RA Volume:   25.80 ml  16.57 ml/m LA Vol (A4C):   124.0 ml 79.64 ml/m LA Biplane Vol: 133.0 ml 85.42 ml/m  AORTIC VALVE               PULMONIC VALVE AV Vmax:      197.50 cm/s  PV Vmax:       1.40 m/s AV Peak Grad: 15.6 mmHg  PV Peak grad:  7.8 mmHg LVOT Vmax:    202.00 cm/s LVOT Vmean:   119.000 cm/s LVOT VTI:     0.302 m  AORTA Ao Asc diam: 3.60 cm TRICUSPID VALVE TR Peak grad:   44.9 mmHg TR Vmax:        335.00 cm/s  SHUNTS Systemic  VTI: 0.30 m Gwyndolyn Kaufman MD Electronically signed by Gwyndolyn Kaufman MD Signature Date/Time: 08/20/2021/12:16:23 PM    Final    CT Angio Chest/Abd/Pel for Dissection W and/or Wo Contrast  Result Date: 08/20/2021 CLINICAL DATA:  Acute aortic syndrome suspected EXAM: CT ANGIOGRAPHY CHEST, ABDOMEN AND PELVIS TECHNIQUE: Non-contrast CT of the chest was initially obtained. Multidetector CT imaging through the chest, abdomen and pelvis was performed using the standard protocol during bolus administration of intravenous contrast. Multiplanar reconstructed images and MIPs were obtained and reviewed to evaluate the vascular anatomy. RADIATION DOSE REDUCTION: This exam was performed according to the departmental dose-optimization program which includes automated exposure control, adjustment of the mA and/or kV according to patient size and/or use of iterative reconstruction technique. CONTRAST:  70mL OMNIPAQUE IOHEXOL 350 MG/ML SOLN COMPARISON:  Abdomen and pelvis CT with contrast 11/18/2016 FINDINGS: CTA CHEST FINDINGS Cardiovascular: Cardiomegaly with small pericardial effusion. Premature aortic and coronary atherosclerosis. High-density catheter remnant at the SVC. 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 Preferential opacification of the aorta. No aneurysm or dissection. No aortic intramural hematoma. Pulmonary arteries are well opacified and negative for acute pulmonary embolism. Web from remote embolism is seen in the right lower lobe on 7:75 Mediastinum/Nodes: Negative for adenopathy or mass Lungs/Pleura: Airway cuffing and interlobular septal thickening at the bases with mosaic attenuation of the lower chest. Fissural thickening in the lower chest. No pleural effusion or air leak. No lobar consolidation. Reticular density at the left apex is likely scarring. Musculoskeletal: No acute or aggressive finding Review of the MIP images confirms the above findings. CTA ABDOMEN AND PELVIS  FINDINGS VASCULAR Aorta: Atheromatous plaque.  No dissection or aneurysm Celiac: Celiac and its branches are widely patent SMA: Vessels are smooth and diffusely patent Renals: Plaque at the right renal artery ostium. Symmetric enhancement other renal arteries without aneurysm or dissection. IMA: Patent Inflow: Atheromatous plaque without stenosis or dissection. Veins: Unremarkable in the arterial phase Review of the MIP images confirms the above findings. NON-VASCULAR Hepatobiliary: No focal liver abnormality.No evidence of biliary obstruction or stone. Pancreas: Unremarkable. Spleen: Unremarkable. Adrenals/Urinary Tract: Negative adrenals. Severe native renal atrophy. Calcified, failed bilateral renal transplant. Unremarkable bladder. Stomach/Bowel: No obstruction. Appendix is not clearly identified in the absence of bowel wall or endoluminal contrast. Few scattered colonic diverticula. Vascular/Lymphatic: No acute vascular abnormality. No mass or adenopathy. Reproductive:Symmetric large appearance of the ovaries also noted in 2018, with scattered internal calcifications. Other: Trace pelvic fluid, nonspecific in isolation. Musculoskeletal: Sclerotic appearance of the bones diffusely. 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 Review of the MIP images confirms the above findings. IMPRESSION: 1. Negative for acute aortic syndrome. 2. Cardiomegaly and mild pulmonary edema. 3. Atherosclerosis which includes the coronary arteries. 4. Remote pulmonary embolism with web.  No acute pulmonary embolism. Electronically Signed   By: Jorje Guild M.D.   On: 08/20/2021 04:23    Disposition:        Allergies as of 08/20/2021       Reactions   Tape Itching, Other (See Comments)   Plastic/clear tape tears  up the patient's skin   Zosyn [piperacillin Sod-tazobactam So] Itching   Has patient had a PCN reaction causing  immediate rash, facial/tongue/throat swelling, SOB or lightheadedness with hypotension: No Has patient had a PCN reaction causing severe rash involving mucus membranes or skin necrosis: No Has patient had a PCN reaction that required hospitalization: No Has patient had a PCN reaction occurring within the last 10 years: No If all of the above answers are "NO", then may proceed with Cephalosporin use.        Medication List     TAKE these medications    b complex-vitamin c-folic acid 0.8 MG Tabs tablet Take 1 tablet by mouth daily.   calcitRIOL 0.25 MCG capsule Commonly known as: ROCALTROL Take 0.5 mcg by mouth daily after breakfast.   carvedilol 25 MG tablet Commonly known as: COREG Take 25 mg by mouth 2 (two) times daily.   fluticasone 50 MCG/ACT nasal spray Commonly known as: FLONASE Place 1 spray into both nostrils daily as needed for allergies or rhinitis.   labetalol 300 MG tablet Commonly known as: NORMODYNE Take 1 tablet (300 mg total) by mouth 2 (two) times daily. What changed: when to take this   losartan 100 MG tablet Commonly known as: Cozaar Take 1 tablet (100 mg total) by mouth daily.   mirtazapine 15 MG tablet Commonly known as: REMERON Take 15 mg by mouth in the morning and at bedtime.   NIFEdipine 90 MG 24 hr tablet Commonly known as: PROCARDIA XL/NIFEDICAL-XL Take 1 tablet (90 mg total) by mouth daily. What changed: when to take this   pantoprazole 40 MG tablet Commonly known as: PROTONIX Take 1 tablet (40 mg total) by mouth at bedtime.   Slynd 4 MG Tabs Generic drug: Drospirenone Take 1 tablet by mouth daily with breakfast. What changed: how much to take   Vitamin B12 1000 MCG Tbcr Take 1,000 mcg by mouth daily.   Vitamin D3 25 MCG (1000 UT) Caps Take 1,000 Units by mouth in the morning.         Follow-up appointment   PCP  Discharge Condition:    stable   Signed: Whitney D. Harris 08/20/2021, 2:54 PM   PM addendum - seen  post HD, tolerated well. Stable for discharge. Will take PM labetalol dose prior to [place] arranged, meds at home, see home med discussion as above. Samara Deist

## 2021-08-20 NOTE — ED Notes (Addendum)
Pt actively moaning and making sobbing noises with no tear production while awaiting bed in department. Pt presents as restless, stating "I'm tired". Lung sounds clear upon auscultation in triage, however pt appears to be hyperventilating.

## 2021-08-20 NOTE — Progress Notes (Signed)
Patient discharged in wheelchair to car in main entrance. Patient had all pt belongings with her, including cell phone, cell phone charger, fan, and [employer]. IV removed prior to discharge. Pt had L FA fistula that was just accessed for HD.

## 2021-08-20 NOTE — H&P (Addendum)
NAME:  Kerry Cook, MRN:  [id number], DOB:  07-Jul-1985, LOS: 0 ADMISSION DATE:  08/20/2021, CONSULTATION DATE:  2/27 REFERRING MD:  Dr. Randal Buba, CHIEF COMPLAINT:  Hypertensive emergency   History of Present Illness:  Patient is a 36 year old female with pertinent PMH of ESRD (HD MWF; failed kidney transplant), HTN, and gastric ulcers presents to [facility] ED on 2/27 with SOB/abdominal discomfort.  Patient states her SOB/abdominal discomfort has been getting progressively worse over the past few days.  She states that she missed her dialysis last Friday on 2/24.  She also states that she is not very compliant with her BP meds.  On arrival to [facility] ED on 2/27, patient BP initially 235/148.  Patient sats stable on room air.  CXR showing mild bilateral pulmonary edema with cardiomegaly.  Troponin initially 116.  BNP 3136.  Patient with diffuse abdominal discomfort.  CT abdomen/pelvis negative.  Patient's K 6.0.  Given calcium, insulin/dextrose, Lokelma, bicarb.  Nephro consulted for HD.  Patient started on Cardene drip gtt. PCCM consulted for ICU admission.  Pertinent  Medical History   Past Medical History:  Diagnosis Date   ESRD on hemodialysis ([place])    started HD in 2010, has had kidney transplant x 2. 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 and is back on HD MWF   History of kidney transplant    1st transplant was 2012- 2015, 2nd transplant was 2018- 2022 approx   Hypertension    Multiple gastric ulcers    Pneumonia 01/23/2015     Significant Hospital Events: Including procedures, antibiotic start and stop dates in addition to other pertinent events   2/27: admitted to [facility] w/ hypertensive emergency started on Cardene drip; nephro consulted for HD  Interim History / Subjective:  Patient Aox3 Complaining of muscle spasm in BLE States no SOB or abdominal pain any more SBP 240 on cardene drip  Objective   Blood pressure (!) 227/161, pulse 97, temperature 98.2 F (36.8 C), temperature source Oral, resp.  rate (!) 22, SpO2 100 %.       No intake or output data in the 24 hours ending 08/20/21 0447 There were no vitals filed for this visit.  Examination: General:  NAD HEENT: MM pink/moist Neuro: Aox3; MAE CV: s1s2, tachy, no m/r/g PULM:  dim clear BS bilaterally; on room air GI: soft, bsx4 active  Extremities: warm/dry, no edema; L forearm fistula without erythema and good thrill Skin: no rashes or lesions appreciated   Resolved Hospital Problem list     Assessment & Plan:   Hypertensive emergency: missed HD treatment last Friday and non compliant with medications Hx of HTN: SBP normally in 160s P: -admit to ICU for continuous telemetry monitoring -cardene gtt with goal SBP 180-190 -urgent HD; nephro consulted -serum drug screen pending -restart home nifedipine; holding home carvedilol (drug screen pending), and losartan while in AKI  -prn hydralazine  AKI on ESRD on HD MWF; Missed HD treatment last Friday; left arm fistula; baseline creat 7.8?; hx of failed kidney transplant Hyperkalemia P: -Given calcium gluconate, insulin/dextrose, bicarb, and Lokelma -Nephro consulted; will need HD -Trend BMP/ urinary output -Replace electrolytes as indicated -Avoid nephrotoxic agents, ensure adequate renal perfusion  Mild Pulmonary Edema Elevated troponin Elevated BNP P: -Nephro consulted for HD -Patient currently on room air in NAD -EKG with no significant changes from prior EKG -trend troponin -CXR as needed -check Echo  Anemia of chronic ESRD Thrombocytopenia P: -trend CBC  Abdominal pain Hx of Gastric ulcers P: -Ct abdomen  negative -LFTs normal; check lipase -Abdominal pain improved since coming into hospital -continue to monitor -PPI  Hx of depression P: -continue home mirtazapine  Best Practice (right click and "Reselect all SmartList Selections" daily)   Diet/type: NPO w/ oral meds DVT prophylaxis: prophylactic heparin  GI prophylaxis: PPI Lines:  N/A Foley:  N/A Code Status:  full code Last date of multidisciplinary goals of care discussion [2/27 spoke with patient and updated at bedside]  Labs   CBC: Recent Labs  Lab 08/20/21 0310 08/20/21 0330  WBC 7.3  --   HGB 10.6* 12.6  HCT 32.0* 37.0  MCV 100.3*  --   PLT 149*  --     Basic Metabolic Panel: Recent Labs  Lab 08/20/21 0310 08/20/21 0330  NA 134* 133*  K 6.0* 6.0*  CL 95* 98  CO2 24  --   GLUCOSE 87 80  BUN 52* 48*  CREATININE 13.66* 15.30*  CALCIUM 10.1  --    GFR: Estimated Creatinine Clearance: 4.2 mL/min (A) (by C-G formula based on SCr of 15.3 mg/dL (H)). Recent Labs  Lab 08/20/21 0310  WBC 7.3    Liver Function Tests: Recent Labs  Lab 08/20/21 0310  AST 23  ALT 16  ALKPHOS 46  BILITOT 0.9  PROT 7.0  ALBUMIN 3.5   No results for input(s): LIPASE, AMYLASE in the last 168 hours. No results for input(s): AMMONIA in the last 168 hours.  ABG    Component Value Date/Time   PHART 7.426 05/20/2021 1949   PCO2ART 26.7 (L) 05/20/2021 1949   PO2ART 119 (H) 05/20/2021 1949   HCO3 17.6 (L) 05/20/2021 1949   TCO2 28 08/20/2021 0330   ACIDBASEDEF 6.0 (H) 05/20/2021 1949   O2SAT 99.0 05/20/2021 1949     Coagulation Profile: No results for input(s): INR, PROTIME in the last 168 hours.  Cardiac Enzymes: No results for input(s): CKTOTAL, CKMB, CKMBINDEX, TROPONINI in the last 168 hours.  HbA1C: Hgb A1c MFr Bld  Date/Time Value Ref Range Status  12/11/2020 01:55 PM 4.8 4.8 - 5.6 % Final    Comment:             Prediabetes: 5.7 - 6.4          Diabetes: >6.4          Glycemic control for adults with diabetes: <7.0     CBG: No results for input(s): GLUCAP in the last 168 hours.  Review of Systems:   Review of Systems  Constitutional:  Negative for fever.  Respiratory:  Positive for shortness of breath. Negative for cough and wheezing.   Cardiovascular:  Negative for chest pain and leg swelling.  Gastrointestinal:  Positive for  abdominal pain. Negative for diarrhea, nausea and vomiting.  Musculoskeletal:  Positive for myalgias.  Neurological:  Negative for headaches.    Past Medical History:  She,  has a past medical history of ESRD on hemodialysis ([place]), History of kidney transplant, Hypertension, Multiple gastric ulcers, and Pneumonia (01/23/2015).   Surgical History:   Past Surgical History:  Procedure Laterality Date   ESOPHAGOGASTRODUODENOSCOPY N/A 04/01/2016   Procedure: ESOPHAGOGASTRODUODENOSCOPY (EGD);  Surgeon: Gatha Mayer, MD;  Location[facility] ENDOSCOPY;  Service: Endoscopy;  Laterality: N/A;   ESOPHAGOGASTRODUODENOSCOPY (EGD) WITH PROPOFOL N/A 11/20/2016   Procedure: ESOPHAGOGASTRODUODENOSCOPY (EGD) WITH PROPOFOL;  Surgeon: Doran Stabler, MD;  Location: [place];  Service: Endoscopy;  Laterality: N/A;   KIDNEY TRANSPLANT Bilateral 2012     Social History:   reports that  she has never smoked. She has never used smokeless tobacco. She reports that she does not drink alcohol and does not use drugs.   Family History:  Her family history includes Cancer in her maternal grandfather; Kidney disease in her father; Lupus in her maternal grandmother. There is no history of Colon cancer, Stomach cancer, Rectal cancer, Esophageal cancer, or Liver cancer.   Allergies Allergies  Allergen Reactions   Tape Itching and Other (See Comments)    Plastic/clear tape tears up the patient's skin   Zosyn [Piperacillin Sod-Tazobactam So] Itching    Has patient had a PCN reaction causing immediate rash, facial/tongue/throat swelling, SOB or lightheadedness with hypotension: No Has patient had a PCN reaction causing severe rash involving mucus membranes or skin necrosis: No Has patient had a PCN reaction that required hospitalization: No Has patient had a PCN reaction occurring within the last 10 years: No If all of the above answers are "NO", then may proceed with Cephalosporin use.      Home Medications   Prior to Admission medications   Medication Sig Start Date End Date Taking? Authorizing Provider  b complex-vitamin c-folic acid (NEPHRO-VITE) 0.8 MG TABS tablet Take 1 tablet by mouth daily. 10/24/14   [provider]  calcitRIOL (ROCALTROL) 0.25 MCG capsule Take 0.5 mcg by mouth daily after breakfast.    [provider]  carvedilol (COREG) 25 MG tablet Take 25 mg by mouth 2 (two) times daily. 07/27/21   [provider]  Cholecalciferol (VITAMIN D3) 25 MCG (1000 UT) CAPS Take 1,000 Units by mouth in the morning.    [provider]  Cyanocobalamin (VITAMIN B12) 1000 MCG TBCR Take 1,000 mcg by mouth daily.    [provider]  Drospirenone (SLYND) 4 MG TABS Take 1 tablet by mouth daily with breakfast. 12/11/20   Shelly Bombard, MD  fluticasone [facility]) 50 MCG/ACT nasal spray Place 1 spray into both nostrils daily as needed for allergies or rhinitis. 01/07/21   Rehman, Areeg N, DO  labetalol (NORMODYNE) 300 MG tablet Take 1 tablet (300 mg total) by mouth 2 (two) times daily. Patient taking differently: Take 300 mg by mouth 3 (three) times daily. 05/24/21   Ghimire, Henreitta Leber, MD  losartan (COZAAR) 100 MG tablet Take 1 tablet (100 mg total) by mouth daily. 05/24/21 05/24/22  Ghimire, Henreitta Leber, MD  mirtazapine (REMERON) 15 MG tablet Take 15 mg by mouth in the morning and at bedtime. 11/01/20 11/01/21  [provider]  NIFEdipine (PROCARDIA XL/NIFEDICAL-XL) 90 MG 24 hr tablet Take 1 tablet (90 mg total) by mouth daily. Patient taking differently: Take 90 mg by mouth at bedtime. 12/15/20 08/09/21  Virl Axe, MD  pantoprazole (PROTONIX) 40 MG tablet Take 1 tablet (40 mg total) by mouth at bedtime. 05/24/21   Ghimire, Henreitta Leber, MD  amLODipine (NORVASC) 10 MG tablet Take 1 tablet (10 mg total) by mouth every evening. Patient not taking: No sig reported 09/13/15 12/14/20  [name], MD     Critical care time: 45 minutes    JD Rexene Agent [facility] 08/20/2021, 4:47 AM  Please see [email or web address] for pager details.  From 7A-7P if no response, please call [phone]. After hours, please call ELink [phone].

## 2021-08-20 NOTE — Progress Notes (Signed)
PCCM Progress Note  Please see full H&P for admission details   Patient is a 36yo female who was admitted for hypertensive emergency resulting in mild pulmonary edema in the setting of missed HD with known ESRD. She was admitted to the ICU for need of Cardene drip, this has almost been weaned off.   She is due to HD today. Once she has been dialyzed she may be able to discharge pending clinical status   [name] D. Kenton Kingfisher, NP-C [facility] Personal contact information can be found on Amion  08/20/2021, 8:54 AM

## 2021-08-20 NOTE — Progress Notes (Signed)
Patient was transferred comfortably into bed in 39M-08. No complications. Patient was given phone and fan, along with blanket. Now is resting comfortably.

## 2021-08-21 ENCOUNTER — Telehealth: Payer: Self-pay | Admitting: Nephrology

## 2021-08-21 LAB — HEPATITIS B SURFACE ANTIBODY, QUANTITATIVE: Hep B S AB Quant (Post): 182.5 m[IU]/mL (ref >9.9)

## 2021-08-21 NOTE — Telephone Encounter (Signed)
Transition of Care Contact from [place]  Date of Discharge: 08/20/21 Date of Contact: 08/21/21 Method of contact: phone - attempted  Attempted to contact patient to discuss transition of care from inpatient admission.  Patient did not answer the phone.  Will follow up at dialysis.   Jen Mow, PA-C [facility] Pager: [phone]

## 2021-08-31 LAB — THC,MS,WB/SP RFX
Cannabidiol: NEGATIVE ng/mL
Cannabinol: NEGATIVE ng/mL
Hydroxy-THC: NEGATIVE ng/mL
Tetrahydrocannabinol(THC): NEGATIVE ng/mL

## 2021-08-31 LAB — DRUG SCREEN 10 W/CONF, SERUM
Amphetamines, IA: NEGATIVE ng/mL
Barbiturates, IA: NEGATIVE ug/mL
Benzodiazepines, IA: NEGATIVE ng/mL
Cocaine & Metabolite, IA: NEGATIVE ng/mL
Methadone, IA: NEGATIVE ng/mL
Opiates, IA: NEGATIVE ng/mL
Oxycodones, IA: NEGATIVE ng/mL
Phencyclidine, IA: NEGATIVE ng/mL
Propoxyphene, IA: NEGATIVE ng/mL

## 2021-09-02 ENCOUNTER — Other Ambulatory Visit: Payer: Self-pay

## 2021-09-02 ENCOUNTER — Emergency Department (HOSPITAL_COMMUNITY): Payer: Medicare Other

## 2021-09-02 ENCOUNTER — Inpatient Hospital Stay (HOSPITAL_COMMUNITY): Payer: Medicare Other

## 2021-09-02 ENCOUNTER — Inpatient Hospital Stay (HOSPITAL_COMMUNITY)
Admission: EM | Admit: 2021-09-02 | Discharge: 2021-09-06 | DRG: 070 | Disposition: A | Discharge status: Home / Self Care | Payer: Medicare Other | Attending: Internal Medicine | Admitting: Internal Medicine

## 2021-09-02 ENCOUNTER — Encounter (HOSPITAL_COMMUNITY): Payer: Self-pay

## 2021-09-02 DIAGNOSIS — Z9114 Patient's other noncompliance with medication regimen: Secondary | ICD-10-CM

## 2021-09-02 DIAGNOSIS — R441 Visual hallucinations: Secondary | ICD-10-CM | POA: Diagnosis not present

## 2021-09-02 DIAGNOSIS — R110 Nausea: Secondary | ICD-10-CM | POA: Diagnosis present

## 2021-09-02 DIAGNOSIS — Z20822 Contact with and (suspected) exposure to covid-19: Secondary | ICD-10-CM | POA: Diagnosis present

## 2021-09-02 DIAGNOSIS — N186 End stage renal disease: Secondary | ICD-10-CM | POA: Diagnosis present

## 2021-09-02 DIAGNOSIS — I161 Hypertensive emergency: Secondary | ICD-10-CM | POA: Diagnosis present

## 2021-09-02 DIAGNOSIS — I3139 Other pericardial effusion (noninflammatory): Secondary | ICD-10-CM | POA: Diagnosis present

## 2021-09-02 DIAGNOSIS — R519 Headache, unspecified: Secondary | ICD-10-CM | POA: Diagnosis not present

## 2021-09-02 DIAGNOSIS — R778 Other specified abnormalities of plasma proteins: Secondary | ICD-10-CM | POA: Diagnosis present

## 2021-09-02 DIAGNOSIS — Z79899 Other long term (current) drug therapy: Secondary | ICD-10-CM

## 2021-09-02 DIAGNOSIS — I6783 Posterior reversible encephalopathy syndrome: Secondary | ICD-10-CM | POA: Diagnosis present

## 2021-09-02 DIAGNOSIS — J9601 Acute respiratory failure with hypoxia: Secondary | ICD-10-CM | POA: Diagnosis not present

## 2021-09-02 DIAGNOSIS — M898X9 Other specified disorders of bone, unspecified site: Secondary | ICD-10-CM | POA: Diagnosis present

## 2021-09-02 DIAGNOSIS — Z841 Family history of disorders of kidney and ureter: Secondary | ICD-10-CM

## 2021-09-02 DIAGNOSIS — I120 Hypertensive chronic kidney disease with stage 5 chronic kidney disease or end stage renal disease: Secondary | ICD-10-CM | POA: Diagnosis present

## 2021-09-02 DIAGNOSIS — D631 Anemia in chronic kidney disease: Secondary | ICD-10-CM | POA: Diagnosis present

## 2021-09-02 DIAGNOSIS — I10 Essential (primary) hypertension: Secondary | ICD-10-CM | POA: Diagnosis not present

## 2021-09-02 DIAGNOSIS — I517 Cardiomegaly: Secondary | ICD-10-CM

## 2021-09-02 DIAGNOSIS — Z992 Dependence on renal dialysis: Secondary | ICD-10-CM | POA: Diagnosis not present

## 2021-09-02 DIAGNOSIS — Z881 Allergy status to other antibiotic agents status: Secondary | ICD-10-CM

## 2021-09-02 DIAGNOSIS — Z809 Family history of malignant neoplasm, unspecified: Secondary | ICD-10-CM | POA: Diagnosis not present

## 2021-09-02 DIAGNOSIS — E875 Hyperkalemia: Secondary | ICD-10-CM | POA: Diagnosis not present

## 2021-09-02 DIAGNOSIS — Z888 Allergy status to other drugs, medicaments and biological substances status: Secondary | ICD-10-CM

## 2021-09-02 DIAGNOSIS — I674 Hypertensive encephalopathy: Secondary | ICD-10-CM | POA: Diagnosis present

## 2021-09-02 DIAGNOSIS — R0902 Hypoxemia: Secondary | ICD-10-CM

## 2021-09-02 DIAGNOSIS — I248 Other forms of acute ischemic heart disease: Secondary | ICD-10-CM | POA: Diagnosis not present

## 2021-09-02 DIAGNOSIS — Z91199 Patient's noncompliance with other medical treatment and regimen due to unspecified reason: Secondary | ICD-10-CM | POA: Diagnosis not present

## 2021-09-02 LAB — COMPREHENSIVE METABOLIC PANEL
ALT: 25 U/L (ref 0–44)
AST: 38 U/L (ref 15–41)
Albumin: 3.6 g/dL (ref 3.5–5.0)
Alkaline Phosphatase: 52 U/L (ref 38–126)
Anion gap: 18 — ABNORMAL HIGH (ref 5–15)
BUN: 39 mg/dL — ABNORMAL HIGH (ref 6–20)
CO2: 24 mmol/L (ref 22–32)
Calcium: 10.5 mg/dL — ABNORMAL HIGH (ref 8.9–10.3)
Chloride: 93 mmol/L — ABNORMAL LOW (ref 98–111)
Creatinine, Ser: 10.25 mg/dL — ABNORMAL HIGH (ref 0.44–1.00)
GFR, Estimated: 5 mL/min — ABNORMAL LOW (ref >60)
Glucose, Bld: 80 mg/dL (ref 70–99)
Potassium: 4.6 mmol/L (ref 3.5–5.1)
Sodium: 135 mmol/L (ref 135–145)
Total Bilirubin: 1.2 mg/dL (ref 0.3–1.2)
Total Protein: 7.3 g/dL (ref 6.5–8.1)

## 2021-09-02 LAB — CBC WITH DIFFERENTIAL/PLATELET
Abs Immature Granulocytes: 0.02 10*3/uL (ref 0.00–0.07)
Basophils Absolute: 0.1 10*3/uL (ref 0.0–0.1)
Basophils Relative: 1 %
Eosinophils Absolute: 0.1 10*3/uL (ref 0.0–0.5)
Eosinophils Relative: 1 %
HCT: 35.1 % — ABNORMAL LOW (ref 36.0–46.0)
Hemoglobin: 11.2 g/dL — ABNORMAL LOW (ref 12.0–15.0)
Immature Granulocytes: 0 %
Lymphocytes Relative: 19 %
Lymphs Abs: 1.4 10*3/uL (ref 0.7–4.0)
MCH: 32.3 pg (ref 26.0–34.0)
MCHC: 31.9 g/dL (ref 30.0–36.0)
MCV: 101.2 fL — ABNORMAL HIGH (ref 80.0–100.0)
Monocytes Absolute: 0.4 10*3/uL (ref 0.1–1.0)
Monocytes Relative: 5 %
Neutro Abs: 5.4 10*3/uL (ref 1.7–7.7)
Neutrophils Relative %: 74 %
Platelets: 150 10*3/uL (ref 150–400)
RBC: 3.47 MIL/uL — ABNORMAL LOW (ref 3.87–5.11)
RDW: 21.2 % — ABNORMAL HIGH (ref 11.5–15.5)
WBC: 7.3 10*3/uL (ref 4.0–10.5)
nRBC: 0 % (ref 0.0–0.2)

## 2021-09-02 LAB — MAGNESIUM: Magnesium: 2.3 mg/dL (ref 1.7–2.4)

## 2021-09-02 LAB — TROPONIN I (HIGH SENSITIVITY)
Troponin I (High Sensitivity): 138 ng/L (ref <18)
Troponin I (High Sensitivity): 143 ng/L (ref <18)

## 2021-09-02 LAB — MRSA NEXT GEN BY PCR, NASAL: MRSA by PCR Next Gen: NOT DETECTED

## 2021-09-02 LAB — GLUCOSE, CAPILLARY
Glucose-Capillary: 99 mg/dL (ref 70–99)
Glucose-Capillary: 92 mg/dL (ref 70–99)

## 2021-09-02 LAB — HCG, SERUM, QUALITATIVE: Preg, Serum: NEGATIVE

## 2021-09-02 MED ORDER — ONDANSETRON HCL 4 MG/2ML IJ SOLN
4.0000 mg | Freq: Once | INTRAMUSCULAR | Status: AC
  Administered 2021-09-02: 4 mg via INTRAVENOUS
  Filled 2021-09-02: qty 2

## 2021-09-02 MED ORDER — MORPHINE SULFATE (PF) 4 MG/ML IV SOLN
4.0000 mg | Freq: Once | INTRAVENOUS | Status: AC
  Administered 2021-09-02: 4 mg via INTRAVENOUS
  Filled 2021-09-02: qty 1

## 2021-09-02 MED ORDER — ONDANSETRON HCL 4 MG/2ML IJ SOLN
4.0000 mg | Freq: Four times a day (QID) | INTRAMUSCULAR | Status: DC | PRN
  Administered 2021-09-04 – 2021-09-05 (×2): 4 mg via INTRAVENOUS
  Filled 2021-09-02 (×2): qty 2

## 2021-09-02 MED ORDER — NICARDIPINE HCL IN NACL 20-0.86 MG/200ML-% IV SOLN
0.0000 mg/h | INTRAVENOUS | Status: DC
  Administered 2021-09-02: 5 mg/h via INTRAVENOUS
  Administered 2021-09-02: 7.5 mg/h via INTRAVENOUS
  Administered 2021-09-02: 3 mg/h via INTRAVENOUS
  Administered 2021-09-03: 15 mg/h via INTRAVENOUS
  Filled 2021-09-02 (×4): qty 200

## 2021-09-02 MED ORDER — ACETAMINOPHEN 650 MG RE SUPP: 650.0000 mg | RECTAL | Status: DC | PRN

## 2021-09-02 MED ORDER — ORAL CARE MOUTH RINSE
15.0000 mL | Freq: Two times a day (BID) | OROMUCOSAL | Status: DC
  Administered 2021-09-02 – 2021-09-05 (×6): 15 mL via OROMUCOSAL

## 2021-09-02 MED ORDER — FENTANYL CITRATE PF 50 MCG/ML IJ SOSY: 25.0000 ug | PREFILLED_SYRINGE | INTRAMUSCULAR | Status: DC | PRN

## 2021-09-02 MED ORDER — SODIUM CHLORIDE 0.9 % IV SOLN: INTRAVENOUS | Status: DC

## 2021-09-02 MED ORDER — ACETAMINOPHEN 325 MG PO TABS
650.0000 mg | ORAL_TABLET | Freq: Four times a day (QID) | ORAL | Status: DC | PRN
  Administered 2021-09-02 – 2021-09-04 (×5): 650 mg via ORAL
  Filled 2021-09-02 (×5): qty 2

## 2021-09-02 MED ORDER — CHLORHEXIDINE GLUCONATE CLOTH 2 % EX PADS
6.0000 | MEDICATED_PAD | Freq: Every day | CUTANEOUS | Status: DC
  Administered 2021-09-02 – 2021-09-05 (×3): 6 via TOPICAL

## 2021-09-02 MED ORDER — FENTANYL CITRATE (PF) 100 MCG/2ML IJ SOLN
25.0000 ug | INTRAMUSCULAR | Status: DC | PRN
  Administered 2021-09-02: 25 ug via INTRAVENOUS
  Filled 2021-09-02: qty 2

## 2021-09-02 NOTE — ED Provider Notes (Cosign Needed)
[facility] Provider Note   CSN: [id number] Arrival date & time: 09/02/21  1421     History ESRD, HTN, Gastric ulcers Chief Complaint  Patient presents with   Nausea    Kerry Cook is a 36 y.o. female.  36 y.o female with a PMH of ESRD MWF, HTN, gastric ulcers presents to the ED via EMS with a chief complaint of nausea, vomiting.  According to patient's ex-boyfriend who provides most of the history, he reports patient woke up with a headache around 8 AM, she continued to have 1 episode of nonbloody, nonbilious emesis.  States she was not able to keep down her blood pressure medication.  He attempted to give her blood pressure medication around 10 AM, consistent of 25 mg of carvedilol, 10 mg of labetalol.  Patient states since his headache began around 8 AM this morning she has loss side, states she is unable to see out of both eyes.  She does have a previous admission 3 weeks ago due to hypertensive crisis.  Patient does report compliance with her medication, she was last dialyzed on Friday.  He is without any chest pain, shortness of breath, abdominal pain.  No trauma.    The history is provided by the patient and medical records.      Home Medications Prior to Admission medications   Medication Sig Start Date End Date Taking? Authorizing Provider  b complex-vitamin c-folic acid (NEPHRO-VITE) 0.8 MG TABS tablet Take 1 tablet by mouth daily. 10/24/14   [provider]  calcitRIOL (ROCALTROL) 0.25 MCG capsule Take 0.5 mcg by mouth daily after breakfast.    [provider]  carvedilol (COREG) 25 MG tablet Take 25 mg by mouth 2 (two) times daily. 07/27/21   [provider]  Cholecalciferol (VITAMIN D3) 25 MCG (1000 UT) CAPS Take 1,000 Units by mouth in the morning.    [provider]  Cyanocobalamin (VITAMIN B12) 1000 MCG TBCR Take 1,000 mcg by mouth daily.    [provider]  Drospirenone (SLYND) 4  MG TABS Take 1 tablet by mouth daily with breakfast. Patient taking differently: Take 4 mg by mouth daily with breakfast. 12/11/20   Shelly Bombard, MD  fluticasone [facility]) 50 MCG/ACT nasal spray Place 1 spray into both nostrils daily as needed for allergies or rhinitis. 01/07/21   Rehman, Areeg N, DO  labetalol (NORMODYNE) 300 MG tablet Take 1 tablet (300 mg total) by mouth 2 (two) times daily. Patient taking differently: Take 300 mg by mouth 3 (three) times daily. 05/24/21   Ghimire, Henreitta Leber, MD  losartan (COZAAR) 100 MG tablet Take 1 tablet (100 mg total) by mouth daily. 05/24/21 05/24/22  Ghimire, Henreitta Leber, MD  mirtazapine (REMERON) 15 MG tablet Take 15 mg by mouth in the morning and at bedtime. 11/01/20 11/01/21  [provider]  NIFEdipine (PROCARDIA XL/NIFEDICAL-XL) 90 MG 24 hr tablet Take 1 tablet (90 mg total) by mouth daily. Patient taking differently: Take 90 mg by mouth at bedtime. 12/15/20 09/29/21  Virl Axe, MD  pantoprazole (PROTONIX) 40 MG tablet Take 1 tablet (40 mg total) by mouth at bedtime. 05/24/21   Ghimire, Henreitta Leber, MD  amLODipine (NORVASC) 10 MG tablet Take 1 tablet (10 mg total) by mouth every evening. Patient not taking: No sig reported 09/13/15 12/14/20  [name], MD      Allergies    Tape and Zosyn [piperacillin sod-tazobactam so]    Review of Systems  Review of Systems  Constitutional:  Negative for chills and fever.  Eyes:  Positive for photophobia.  Respiratory:  Negative for shortness of breath.   Cardiovascular:  Negative for chest pain.  Gastrointestinal:  Positive for nausea and vomiting. Negative for abdominal pain.  Musculoskeletal:  Negative for back pain and neck pain.  Neurological:  Positive for headaches.  All other systems reviewed and are negative.  Physical Exam Updated Vital Signs BP (!) 146/97    Pulse 95    Temp 98.4 F (36.9 C) (Oral)    Resp (!) 21    Ht 5' (1.524 m)    Wt 59 kg    SpO2 97%    BMI 25.39 kg/m   Physical Exam Vitals and nursing note reviewed.  Constitutional:      Appearance: Normal appearance.  HENT:     Head: Normocephalic and atraumatic.     Mouth/Throat:     Mouth: Mucous membranes are dry.  Eyes:     Extraocular Movements: Extraocular movements intact.  Cardiovascular:     Rate and Rhythm: Tachycardia present.  Pulmonary:     Effort: Pulmonary effort is normal.  Abdominal:     General: Abdomen is flat.  Musculoskeletal:     Cervical back: Normal range of motion and neck supple.  Skin:    General: Skin is warm and dry.  Neurological:     Mental Status: She is alert and oriented to person, place, and time.     Comments: Neuro exam difficult to performed due to patients cooperation.  Alert, oriented, thought content appropriate. Speech fluent without evidence of aphasia. Able to follow 2 step commands without difficulty.  Cranial Nerves:  II:  reports unable to visualize "anything" III,IV, VI: ptosis not present, extra-ocular motions intact bilaterally  V,VII: smile symmetric, facial light touch sensation equal VIII: hearing grossly normal bilaterally  IX,X: midline uvula rise  XI: bilateral shoulder shrug equal and strong XII: midline tongue extension  Motor:  5/5 in upper and lower extremities bilaterally including strong and equal grip strength and dorsiflexion/plantar flexion (with low effort) Sensory: light touch normal in all extremities.  Cerebellar: normal finger-to-nose with bilateral upper extremities, pronator drift negative Gait: not tested      ED Results / Procedures / Treatments   Labs (all labs ordered are listed, but only abnormal results are displayed) Labs Reviewed  CBC WITH DIFFERENTIAL/PLATELET - Abnormal; Notable for the following components:      Result Value   RBC 3.47 (*)    Hemoglobin 11.2 (*)    HCT 35.1 (*)    MCV 101.2 (*)    RDW 21.2 (*)    All other components within normal limits  COMPREHENSIVE METABOLIC PANEL -  Abnormal; Notable for the following components:   Chloride 93 (*)    BUN 39 (*)    Creatinine, Ser 10.25 (*)    Calcium 10.5 (*)    GFR, Estimated 5 (*)    Anion gap 18 (*)    All other components within normal limits  TROPONIN I (HIGH SENSITIVITY) - Abnormal; Notable for the following components:   Troponin I (High Sensitivity) 138 (*)    All other components within normal limits  TROPONIN I (HIGH SENSITIVITY) - Abnormal; Notable for the following components:   Troponin I (High Sensitivity) 143 (*)    All other components within normal limits  MRSA NEXT GEN BY PCR, NASAL  MAGNESIUM  HCG, SERUM, QUALITATIVE  RENAL FUNCTION PANEL  CBC  EKG EKG Interpretation  Date/Time:  Sunday September 02 2021 14:56:57 EDT Ventricular Rate:  92 PR Interval:  182 QRS Duration: 86 QT Interval:  402 QTC Calculation: 498 R Axis:   -52 Text Interpretation: Sinus rhythm Left atrial enlargement Left anterior fascicular block LVH with secondary repolarization abnormality Anterior Q waves, possibly due to LVH Prolonged QT interval st changes resolved since last tracing Confirmed by Dorie Rank [id number]) on 09/02/2021 3:53:55 PM  Radiology DG Chest 2 View  Result Date: 09/02/2021 CLINICAL DATA:  Headaches and blurry vision.  Vomiting.  Chest pain EXAM: CHEST - 2 VIEW COMPARISON:  08/20/2021 FINDINGS: Apical lordotic portable frontal radiograph. Numerous leads and wires project over the chest. Moderate cardiomegaly. No definite pleural fluid. Interstitial prominence is accentuated by low lung volumes. Increased bibasilar density on the frontal view is favored to be secondary to apical lordotic, portable technique. No correlate airspace disease on the lateral. IMPRESSION: Cardiomegaly with mild pulmonary interstitial prominence, favoring pulmonary venous congestion. Electronically Signed   By: Abigail Miyamoto M.D.   On: 09/02/2021 16:19   CT Head Wo Contrast  Result Date: 09/02/2021 CLINICAL DATA:  36 year old  female with acute headache. EXAM: CT HEAD WITHOUT CONTRAST TECHNIQUE: Contiguous axial images were obtained from the base of the skull through the vertex without intravenous contrast. RADIATION DOSE REDUCTION: This exam was performed according to the departmental dose-optimization program which includes automated exposure control, adjustment of the mA and/or kV according to patient size and/or use of iterative reconstruction technique. COMPARISON:  08/08/2021 FINDINGS: Brain: A 1.5 cm peripheral RIGHT occipital hypodensity is nonspecific but suspicious for an acute to subacute infarct. No other significant intracranial abnormalities are identified. There is no evidence of midline shift, hemorrhage, hydrocephalus or extra-axial collection. Vascular: No hyperdense vessel or unexpected calcification. Skull: Normal. Negative for fracture or focal lesion. Sinuses/Orbits: No acute finding. Other: None IMPRESSION: 1. 1.5 cm peripheral RIGHT occipital hypodensity, nonspecific but suspicious for acute to subacute infarct. Recommend MRI for further evaluation. 2. No evidence of hemorrhage. Electronically Signed   By: Margarette Canada M.D.   On: 09/02/2021 15:27    Procedures .Critical Care Performed by: Janeece Fitting, PA-C Authorized by: Janeece Fitting, PA-C   Critical care provider statement:    Critical care time (minutes):  45   Critical care start time:  09/02/2021 4:00 PM   Critical care end time:  09/02/2021 4:45 PM   Critical care was necessary to treat or prevent imminent or life-threatening deterioration of the following conditions:  CNS failure or compromise   Critical care was time spent personally by me on the following activities:  Evaluation of patient's response to treatment, discussions with primary provider and discussions with consultants   Care discussed with: admitting provider      Medications Ordered in ED Medications  nicardipine (CARDENE) 20mg  in 0.86% saline 273ml IV infusion (0.1 mg/ml) (3  mg/hr Intravenous Rate/Dose Change 09/02/21 1850)  acetaminophen (TYLENOL) tablet 650 mg (has no administration in time range)    Or  acetaminophen (TYLENOL) suppository 650 mg (has no administration in time range)  fentaNYL (SUBLIMAZE) injection 25 mcg (has no administration in time range)  Chlorhexidine Gluconate Cloth 2 % PADS 6 each (has no administration in time range)  MEDLINE mouth rinse (has no administration in time range)  0.9 %  sodium chloride infusion (has no administration in time range)  ondansetron (ZOFRAN) injection 4 mg (has no administration in time range)  morphine (PF) 4 MG/ML injection 4 mg (4  mg Intravenous Given 09/02/21 1505)  ondansetron (ZOFRAN) injection 4 mg (4 mg Intravenous Given 09/02/21 1505)  morphine (PF) 4 MG/ML injection 4 mg (4 mg Intravenous Given 09/02/21 1637)  morphine (PF) 4 MG/ML injection 4 mg (4 mg Intravenous Given 09/02/21 1751)    ED Course/ Medical Decision Making/ A&P Clinical Course as of 09/02/21 1904  Sun Sep 02, 2021  1805 Troponin I (High Sensitivity)(!!): 138 No chest pain present, on her baseline. [JS]  0300 Creatinine(!): 10.25 Last dialysis session Friday 08/31/2021 [JS]    Clinical Course User Index [JS] Janeece Fitting, PA-C                           Medical Decision Making Amount and/or Complexity of Data Reviewed Labs: ordered. Decision-making details documented in ED Course. Radiology: ordered.  Risk Prescription drug management. Decision regarding hospitalization.  This patient presents to the ED for concern of nausea, this involves a number of treatment options, and is a complaint that carries with it a high risk of complications and morbidity.  The differential diagnosis includes CVA, gastroparesis, pregnancy.    Co morbidities: Discussed in HPI   Brief History:  Patient with an extended past medical history including ESRD last dialyzed Friday presents to the ED via EMS with sudden onset of headache, nausea and  vomiting that began this morning around 8 AM.  Evaluated by me around 3:30 PM today.  Complains of a being unable to keep her blood pressure medication down, does report compliance with the medication.  Also endorsing loss of sight to bilateral eyes.  No weakness decrease in strength to upper or lower extremities.  EMR reviewed including pt PMHx, past surgical history and past visits to ER.   See HPI for more details   Lab Tests:  I ordered and independently interpreted labs.  The pertinent results include:    Labs notable for CMP consistent with a creatinine of 10.25, which is around patient's baseline.  LFTs are within normal limits.  Electrolytes are otherwise normal.  CBC with no leukocytosis, hemoglobin is 11.2 and stable.  Magnesium level is normal.   Imaging Studies:  CT head was ordered by previous provider who MSE to patient before my evaluation. 1. 1.5 cm peripheral RIGHT occipital hypodensity, nonspecific but  suspicious for acute to subacute infarct. Recommend MRI for further  evaluation.  2. No evidence of hemorrhage.       Cardiac Monitoring:  The patient was maintained on a cardiac monitor.  I personally viewed and interpreted the cardiac monitored which showed an underlying rhythm of: NSR EKG non-ischemic   Medicines ordered:  Patient did receive 4 mg of morphine, Zofran which were given by prior provider. I ordered medication including cardene  for blood pressure control  Reevaluation of the patient after these medicines showed that the patient stayed the same I have reviewed the patients home medicines and have made adjustments as needed   Critical Interventions:  Patient was started on Cardene drip due to elevate BP and concern for HTN Emergency. After CT findings Drip was started.    Consults:  I requested consultation with Dr. Rory Percy, neurologist  and discussed lab and imaging findings as well as pertinent plan - they recommend: MRI Brain without  contrast to evaluate for subacute versus acute infarct versus PRESS.     Reevaluation:  After the interventions noted above I re-evaluated patient and found that they have :stayed the same  Social Determinants of Health:   Spoke to aunt  Buena Irish [phone] who reported patient has been "in between houses "for the past weeks.  She last saw patient approximately a few days ago, and states patient seemed "a little off ", is unsure whether patient has been taking her blood pressure medication.    Problem List / ED Course:  Patient with underlying ESRD on dialysis Monday Wednesday Friday last dialyzed this past Friday on 08/31/2021 here with thickened elevated blood pressure, concern for hypertensive emergency, placed on a Cardene drip.  CT with some findings concern for ischemic hemorrhage.  Neurology has been consulted, Dr. Malen Gauze recommended MRI brain.  Patient will need ICU care at this time due to being on a Cardene drip.   Dispostion:  After consideration of the diagnostic results and the patients response to treatment, I feel that the patent would benefit from ICU care.   6:12 PM Spoke to Critical care team who agreed to evaluate patient in the ED.   7:01 PM Spoke to aunt Buena Irish who was informed of patients placement.    Portions of this note were generated with [employer]. Dictation errors may occur despite best attempts at proofreading.   Final Clinical Impression(s) / ED Diagnoses Final diagnoses:  Nausea  Acute intractable headache, unspecified headache type    Rx / DC Orders ED Discharge Orders     None         Janeece Fitting, Hershal Coria 09/02/21 1904

## 2021-09-02 NOTE — Progress Notes (Signed)
eLink Physician-Brief Progress Note ?Patient Name: Kerry Cook ?DOB: 1985/08/21 ?MRN: [id number] ? ? ?Date of Service ? 09/02/2021  ?HPI/Events of Note ? Patient with a history of ESRD admitted with hypertensive emergency and PRES involving bilateral occipital lobes, with visual impairment, Cardizem gtt started with improvement in blood pressure. She will need hemodialysis during this hospitalization.  ?eICU Interventions ? New Patient Evaluation completed.  ? ? ? ?  ? ?Frederik Pear ?09/02/2021, 10:39 PM ?

## 2021-09-02 NOTE — ED Provider Triage Note (Signed)
Emergency Medicine Provider Triage Evaluation Note ? ?Kerry Cook , a 36 y.o. female  was evaluated in triage.  Pt complains of headache, vomiting of 2-day duration.  Patient is gestational disease patient.  Reports last had dialysis Friday.  Recently discharged from admission for hypertensive crisis.  Denies chest pain, shortness of breath, palpitations, abdominal pain. ? ?Review of Systems  ?Positive: Headache, visual disturbance ?Negative: Fever, chest pain, shortness of breath ? ?Physical Exam  ?BP (!) 264/170   Pulse 96   Temp 98.4 ?F (36.9 ?C) (Oral)   Resp (!) 24   SpO2 97%  ?Gen:   Awake, no distress   ?Resp:  Normal effort  ?MSK:   Moves extremities without difficulty  ?Other:   ? ?Medical Decision Making  ?Medically screening exam initiated at 3:12 PM.  Appropriate orders placed.  Kerry Cook was informed that the remainder of the evaluation will be completed by another provider, this initial triage assessment does not replace that evaluation, and the importance of remaining in the ED until their evaluation is complete. ? ? ?  ?Evlyn Courier, PA-C ?09/02/21 1514 ? ?
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
## 2021-09-02 NOTE — ED Notes (Signed)
Pt transported to xray 
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
## 2021-09-02 NOTE — ED Notes (Signed)
Pt returned from xray

## 2021-09-03 ENCOUNTER — Inpatient Hospital Stay (HOSPITAL_COMMUNITY): Payer: Medicare Other

## 2021-09-03 DIAGNOSIS — I161 Hypertensive emergency: Secondary | ICD-10-CM

## 2021-09-03 DIAGNOSIS — R778 Other specified abnormalities of plasma proteins: Secondary | ICD-10-CM | POA: Diagnosis not present

## 2021-09-03 DIAGNOSIS — R110 Nausea: Secondary | ICD-10-CM | POA: Diagnosis not present

## 2021-09-03 DIAGNOSIS — Z91199 Patient's noncompliance with other medical treatment and regimen due to unspecified reason: Secondary | ICD-10-CM

## 2021-09-03 DIAGNOSIS — I6783 Posterior reversible encephalopathy syndrome: Secondary | ICD-10-CM

## 2021-09-03 DIAGNOSIS — I10 Essential (primary) hypertension: Secondary | ICD-10-CM

## 2021-09-03 DIAGNOSIS — R519 Headache, unspecified: Secondary | ICD-10-CM

## 2021-09-03 LAB — RENAL FUNCTION PANEL
Albumin: 3.1 g/dL — ABNORMAL LOW (ref 3.5–5.0)
Anion gap: 20 — ABNORMAL HIGH (ref 5–15)
BUN: 51 mg/dL — ABNORMAL HIGH (ref 6–20)
CO2: 21 mmol/L — ABNORMAL LOW (ref 22–32)
Calcium: 9.4 mg/dL (ref 8.9–10.3)
Chloride: 96 mmol/L — ABNORMAL LOW (ref 98–111)
Creatinine, Ser: 11.73 mg/dL — ABNORMAL HIGH (ref 0.44–1.00)
GFR, Estimated: 4 mL/min — ABNORMAL LOW (ref >60)
Glucose, Bld: 80 mg/dL (ref 70–99)
Phosphorus: 9.7 mg/dL — ABNORMAL HIGH (ref 2.5–4.6)
Potassium: 5.1 mmol/L (ref 3.5–5.1)
Sodium: 137 mmol/L (ref 135–145)

## 2021-09-03 LAB — CBC
HCT: 32 % — ABNORMAL LOW (ref 36.0–46.0)
Hemoglobin: 10.4 g/dL — ABNORMAL LOW (ref 12.0–15.0)
MCH: 33.2 pg (ref 26.0–34.0)
MCHC: 32.5 g/dL (ref 30.0–36.0)
MCV: 102.2 fL — ABNORMAL HIGH (ref 80.0–100.0)
Platelets: 128 10*3/uL — ABNORMAL LOW (ref 150–400)
RBC: 3.13 MIL/uL — ABNORMAL LOW (ref 3.87–5.11)
RDW: 21 % — ABNORMAL HIGH (ref 11.5–15.5)
WBC: 7.8 10*3/uL (ref 4.0–10.5)
nRBC: 0.4 % — ABNORMAL HIGH (ref 0.0–0.2)

## 2021-09-03 LAB — GLUCOSE, CAPILLARY
Glucose-Capillary: 77 mg/dL (ref 70–99)
Glucose-Capillary: 72 mg/dL (ref 70–99)
Glucose-Capillary: 80 mg/dL (ref 70–99)
Glucose-Capillary: 117 mg/dL — ABNORMAL HIGH (ref 70–99)
Glucose-Capillary: 83 mg/dL (ref 70–99)

## 2021-09-03 LAB — ECHOCARDIOGRAM COMPLETE
Area-P 1/2: 2.95 cm2
Calc EF: 73.6 %
Height: 60 in
S' Lateral: 3 cm
Single Plane A2C EF: 73.9 %
Single Plane A4C EF: 73.7 %
Weight: 2038.81 oz

## 2021-09-03 MED ORDER — FERRIC CITRATE 1 GM 210 MG(FE) PO TABS
420.0000 mg | ORAL_TABLET | Freq: Three times a day (TID) | ORAL | Status: DC
  Administered 2021-09-03 – 2021-09-06 (×6): 420 mg via ORAL
  Filled 2021-09-03 (×10): qty 2

## 2021-09-03 MED ORDER — LOSARTAN POTASSIUM 50 MG PO TABS
100.0000 mg | ORAL_TABLET | Freq: Every day | ORAL | Status: DC
Start: 2021-09-03 — End: 2021-09-06
  Administered 2021-09-03 – 2021-09-06 (×4): 100 mg via ORAL
  Filled 2021-09-03 (×4): qty 2

## 2021-09-03 MED ORDER — NIFEDIPINE ER OSMOTIC RELEASE 60 MG PO TB24
90.0000 mg | ORAL_TABLET | Freq: Every day | ORAL | Status: DC
  Administered 2021-09-03 – 2021-09-06 (×4): 90 mg via ORAL
  Filled 2021-09-03 (×4): qty 1

## 2021-09-03 MED ORDER — CARVEDILOL 25 MG PO TABS
25.0000 mg | ORAL_TABLET | Freq: Two times a day (BID) | ORAL | Status: DC
  Administered 2021-09-03 – 2021-09-06 (×5): 25 mg via ORAL
  Filled 2021-09-03 (×5): qty 1

## 2021-09-03 MED ORDER — LABETALOL HCL 200 MG PO TABS
300.0000 mg | ORAL_TABLET | Freq: Two times a day (BID) | ORAL | Status: DC
  Administered 2021-09-03: 300 mg via ORAL
  Filled 2021-09-03: qty 1

## 2021-09-03 MED ORDER — HEPARIN SODIUM (PORCINE) 5000 UNIT/ML IJ SOLN
5000.0000 [IU] | Freq: Three times a day (TID) | INTRAMUSCULAR | Status: DC
  Administered 2021-09-03 – 2021-09-06 (×8): 5000 [IU] via SUBCUTANEOUS
  Filled 2021-09-03 (×9): qty 1

## 2021-09-03 MED ORDER — CHLORHEXIDINE GLUCONATE CLOTH 2 % EX PADS: 6.0000 | MEDICATED_PAD | Freq: Every day | CUTANEOUS | Status: DC

## 2021-09-03 MED ORDER — DIPHENHYDRAMINE HCL 25 MG PO CAPS
25.0000 mg | ORAL_CAPSULE | Freq: Four times a day (QID) | ORAL | Status: DC | PRN
  Administered 2021-09-03 – 2021-09-06 (×6): 25 mg via ORAL
  Filled 2021-09-03 (×6): qty 1

## 2021-09-03 NOTE — Consult Note (Addendum)
Neurology Consult H&P ? ?Kerry Cook ?MR# [id number] ?09/03/2021 ? ? ?CC: HTN emergency ? ?History is obtained from: patient and chart. ? ?HPI: Kerry Cook is a 36 y.o. female PMHx as reviewed below, HTN, ESRD on HD (failed kidney transplant), non-compliance presented to [facility] 09/02/2021 with HA, near-total vision loss and actively vomiting found to have SBP 264 and nicardipine infusion started. ? ?She states that ~1000, her vision became blurry and then dark prompting ED visit.  ? ?She has associated n/v, visual changes and encephalopathy ? ?ROS: A complete ROS was performed and is negative except as noted in the HPI.  ? ?Past Medical History:  ?Diagnosis Date  ? ESRD on hemodialysis ([place])   ? started HD in 2010, has had kidney transplant x 2. 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 and is back on HD MWF  ? History of kidney transplant   ? 1st transplant was 2012- 2015, 2nd transplant was 2018- 2022 approx  ? Hypertension   ? Multiple gastric ulcers   ? Pneumonia 01/23/2015  ? ?Family History  ?Problem Relation Age of Onset  ? Kidney disease Father   ? Lupus Maternal Grandmother   ? Cancer Maternal Grandfather   ? Colon cancer Neg Hx   ? Stomach cancer Neg Hx   ? Rectal cancer Neg Hx   ? Esophageal cancer Neg Hx   ? Liver cancer Neg Hx   ? ?Social History:  reports that she has never smoked. She has never used smokeless tobacco. She reports that she does not drink alcohol and does not use drugs. ? ?Prior to Admission medications   ?Medication Sig Start Date End Date Taking? Authorizing Provider  ?b complex-vitamin c-folic acid (NEPHRO-VITE) 0.8 MG TABS tablet Take 1 tablet by mouth daily. 10/24/14   [provider]  ?calcitRIOL (ROCALTROL) 0.25 MCG capsule Take 0.5 mcg by mouth daily after breakfast.    [provider]  ?carvedilol (COREG) 25 MG tablet Take 25 mg by mouth 2 (two) times daily. 07/27/21   [provider]  ?Cholecalciferol (VITAMIN D3) 25 MCG (1000 UT) CAPS Take 1,000 Units by mouth in  the morning.    [provider]  ?Cyanocobalamin (VITAMIN B12) 1000 MCG TBCR Take 1,000 mcg by mouth daily.    [provider]  ?Drospirenone (SLYND) 4 MG TABS Take 1 tablet by mouth daily with breakfast. ?Patient taking differently: Take 4 mg by mouth daily with breakfast. 12/11/20   Shelly Bombard, MD  ?fluticasone [facility]) 50 MCG/ACT nasal spray Place 1 spray into both nostrils daily as needed for allergies or rhinitis. 01/07/21   Rehman, Areeg N, DO  ?labetalol (NORMODYNE) 300 MG tablet Take 1 tablet (300 mg total) by mouth 2 (two) times daily. ?Patient taking differently: Take 300 mg by mouth 3 (three) times daily. 05/24/21   Ghimire, Henreitta Leber, MD  ?losartan (COZAAR) 100 MG tablet Take 1 tablet (100 mg total) by mouth daily. 05/24/21 05/24/22  Ghimire, Henreitta Leber, MD  ?mirtazapine (REMERON) 15 MG tablet Take 15 mg by mouth in the morning and at bedtime. 11/01/20 11/01/21  [provider]  ?NIFEdipine (PROCARDIA XL/NIFEDICAL-XL) 90 MG 24 hr tablet Take 1 tablet (90 mg total) by mouth daily. ?Patient taking differently: Take 90 mg by mouth at bedtime. 12/15/20 09/29/21  Virl Axe, MD  ?pantoprazole (PROTONIX) 40 MG tablet Take 1 tablet (40 mg total) by mouth at bedtime. 05/24/21   Ghimire, Henreitta Leber, MD  ?amLODipine (NORVASC) 10 MG tablet Take 1 tablet (  10 mg total) by mouth every evening. ?Patient not taking: No sig reported 09/13/15 12/14/20  [name], MD  ? ? ?Exam: ?Current vital signs: ?BP (!) 161/98   Pulse 78   Temp 98.1 ?F (36.7 ?C) (Oral)   Resp 14   Ht 5' (1.524 m)   Wt 57.8 kg   SpO2 96%   BMI 24.89 kg/m?  ? ?Physical Exam  ?Constitutional: Appears well-developed and well-nourished.  ?Psych: Affect appropriate to situation ?Eyes: No scleral injection ?HENT: No OP obstruction. ?Head: Normocephalic.  ?Cardiovascular: Normal rate and regular rhythm.  ?Respiratory: Effort normal, symmetric excursions bilaterally, no audible wheezing. ?GI: Soft.  No distension. There  is no tenderness.  ?Skin: WDI ? ?Neuro: ?Mental Status: ?Patient is awake, alert, oriented to person, place, month, year, and situation. ?Patient is able to give a clear and coherent history. ?Speech  fluent, intact comprehension and repetition. ?No signs of aphasia or neglect. ?Visual Fields visual loss with central sparing. Pupils are equal, round, and reactive to light. ?EOMI without ptosis or diplopia.  ?Facial sensation is symmetric to temperature ?Facial movement is symmetric.  ?Hearing is intact to voice. ?Uvula midline and palate elevates symmetrically. ?Shoulder shrug is symmetric. ?Tongue is midline without atrophy or fasciculations.  ?Tone is normal. Bulk is normal. 5/5 strength was present in all four extremities. ?Sensation is symmetric to light touch and temperature in the arms and legs. ?Deep Tendon Reflexes: 2+ and symmetric in the biceps and patellae. ?Toes are downgoing bilaterally. ?FNF and HKS are intact bilaterally. ?Gait - Deferred ? ?I have reviewed labs in epic and the pertinent results are: ?CBG 77 ? ?I have reviewed the images obtained: ?Mri brain showed patchy multifocal T2/FLAIR signal changes in the ?cortical and subcortical aspects of the right > left occipital poles without associated blood. ? ?Assessment: Kerry Cook is a 36 y.o. female PMHx as noted above, HTN, ESRD on HD arrived with HTN emergency found to have PRES. she states that her blood pressures when she measures them at home are in the 200s and she frequently misses doses of her hypertensive medications.  Exam seems to have visual loss with central sparing which she states has been chronic? ? ? ?Impression:  ?Hypertensive emergency ?PRES ?Medication noncompliance ?HTN  ?ESRD on HD M/W/F  ? ? ?Plan: ?Continue nicardipine infusion to gradually reduce MAP: ?- Target blood pressure of <180/<160mmHg for the first hour. ?- Then <160/<165mmHg for the next 23 hours. ? ? ?ESRD: per nephrology ? ? ?This patient is critically  ill and at significant risk of neurological worsening, death and care requires constant monitoring of vital signs, hemodynamics,respiratory and cardiac monitoring, neurological assessment, discussion with family, other specialists and medical decision making of high complexity. I spent 60 minutes of neurocritical care time  in the care of  this patient. This was time spent independent of any time provided by nurse practitioner or PA. ? ?Electronically signed by:  ?Lynnae Sandhoff, MD ?Page: [phone] ?09/03/2021, 6:52 AM ? ?If 7pm- 7am, please page neurology on call as listed in [facility]. ? ?

## 2021-09-03 NOTE — Plan of Care (Signed)
?  Problem: Nutrition: ?Goal: Adequate nutrition will be maintained ?Outcome: Progressing ?  ?Problem: Clinical Measurements: ?Goal: Respiratory complications will improve ?Outcome: Not Progressing ?Note: Pt is requiring increased oxygen needs. iHD planned for today. ?  ?Problem: Elimination: ?Goal: Will not experience complications related to urinary retention ?Outcome: Not Applicable ?  ?
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
## 2021-09-03 NOTE — Consult Note (Signed)
Reason for Consult: ESRD pt w/ uncont HTN Referring Physician:  Dr. Shearon Stalls  Chief Complaint: Blurry vision  OP HD: MWF AF 3h 67min  400/500  Hep 3500  LFA AVF Went to past 3 treatments; left at 56kg on Fri (3/10) UFP 2 Hectorol 10 qtx Mircera 225 q2w given 3/1     Assessment/ Plan: SOB/ mild pulm edema - due to severe uncont HTN. Not vol overloaded on exam, not in distress. On IV cardene in ED. Will plan HD today when pt is in ICU bed.  Hyperkalemia - K 5.1,  HD today Uncont HTN - missed home meds x 24 hrs, severe HTN in ED 230/ 160. Getting IV cardene and BP's coming down now.  ESRD - usual HD MWF. HD today.  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 female hx of HTN, failed renal transplant x 2, anemia ckd/ other, ESRD on HD MWF presented to ED yesterday evening with headache, dizziness and vomiting.  She also complained of blurry vision which was the primary reason for presentation to the ED. In ED BP very high ~ 264/170 and K+ 5.1 IV Cardene was started and CT  head showed a 1.4cm peripheral rt occipital hypodensity. Patient noted to be somnolent but arousable and following commands. She admits to frequently missing doses of her antihypertensive medications.    ROS Pertinent items are noted in HPI.  Chemistry and CBC: Creatinine, Ser  Date/Time Value Ref Range Status  09/03/2021 02:49 AM 11.73 (H) 0.44 - 1.00 mg/dL Final  09/02/2021 03:07 PM 10.25 (H) 0.44 - 1.00 mg/dL Final  08/20/2021 06:54 AM 13.60 (H) 0.44 - 1.00 mg/dL Final  08/20/2021 03:30 AM 15.30 (H) 0.44 - 1.00 mg/dL Final  08/20/2021 03:10 AM 13.66 (H) 0.44 - 1.00 mg/dL Final  08/08/2021 06:45 PM 7.81 (H) 0.44 - 1.00 mg/dL Final  07/22/2021 09:55 PM 13.31 (H) 0.44 - 1.00 mg/dL Final  05/21/2021 10:58 AM 11.08 (H) 0.44 - 1.00 mg/dL Final  05/21/2021 05:00 AM 8.21 (H) 0.44  - 1.00 mg/dL Final  05/21/2021 02:37 AM 20.74 (H) 0.44 - 1.00 mg/dL Final  05/20/2021 09:53 PM >18.00 (H) 0.44 - 1.00 mg/dL Final  05/20/2021 08:58 PM 20.36 (H) 0.44 - 1.00 mg/dL Final  01/06/2021 07:51 AM 7.12 (H) 0.44 - 1.00 mg/dL Final    Comment:    DELTA CHECK NOTED  01/05/2021 12:34 PM 4.70 (H) 0.44 - 1.00 mg/dL Final  01/05/2021 12:17 PM 4.51 (H) 0.44 - 1.00 mg/dL Final  12/14/2020 04:58 AM 7.92 (H) 0.44 - 1.00 mg/dL Final    Comment:    DELTA CHECK NOTED  12/13/2020 10:55 AM 4.74 (H) 0.44 - 1.00 mg/dL Final  10/28/2020 02:50 PM 16.11 (H) 0.44 - 1.00 mg/dL Final    Comment:    DIALYSIS  10/28/2020 12:12 AM 26.89 (H) 0.44 - 1.00 mg/dL Final    Comment:    RESULTS CONFIRMED BY MANUAL DILUTION  10/19/2019 09:05 AM 4.00 (H) 0.44 - 1.00 mg/dL Final  03/22/2019 06:20 PM 2.62 (H) 0.44 - 1.00 mg/dL Final  09/08/2017 04:23 PM 7.30 (H) 0.44 - 1.00 mg/dL Final  04/23/2017 02:20 AM 12.32 (H) 0.44 - 1.00 mg/dL Final  04/22/2017 02:20 AM 8.29 (H) 0.44 - 1.00 mg/dL Final  11/19/2016 07:12 AM 10.86 (H) 0.44 -  1.00 mg/dL Final  11/18/2016 03:00 PM 12.97 (H) 0.44 - 1.00 mg/dL Final  11/18/2016 04:34 AM 12.76 (H) 0.44 - 1.00 mg/dL Final  11/17/2016 07:47 PM 11.74 (H) 0.44 - 1.00 mg/dL Final  11/08/2016 03:12 PM 10.70 (H) 0.44 - 1.00 mg/dL Final  10/13/2016 10:46 PM 13.86 (H) 0.44 - 1.00 mg/dL Final  05/21/2016 04:24 AM 10.87 (H) 0.44 - 1.00 mg/dL Final  05/20/2016 06:28 AM 15.52 (H) 0.44 - 1.00 mg/dL Final  05/19/2016 05:54 PM 13.62 (H) 0.44 - 1.00 mg/dL Final  05/19/2016 09:45 AM 12.55 (H) 0.44 - 1.00 mg/dL Final  04/01/2016 04:51 PM 14.19 (H) 0.44 - 1.00 mg/dL Final  03/31/2016 05:51 PM 12.62 (H) 0.44 - 1.00 mg/dL Final  03/24/2016 03:40 PM 12.08 (H) 0.44 - 1.00 mg/dL Final  03/23/2016 11:55 PM 9.96 (H) 0.44 - 1.00 mg/dL Final  09/13/2015 05:40 AM 10.57 (H) 0.44 - 1.00 mg/dL Final  09/12/2015 03:59 AM 7.72 (H) 0.44 - 1.00 mg/dL Final  09/11/2015 04:07 PM 5.95 (H) 0.44 - 1.00 mg/dL Final   06/09/2015 07:39 AM 13.37 (H) 0.44 - 1.00 mg/dL Final  06/08/2015 06:55 AM 10.68 (H) 0.44 - 1.00 mg/dL Final  06/07/2015 04:52 PM 8.12 (H) 0.44 - 1.00 mg/dL Final  06/05/2015 09:10 PM 6.89 (H) 0.44 - 1.00 mg/dL Final  02/18/2015 05:40 AM 4.45 (H) 0.44 - 1.00 mg/dL Final    Comment:    DELTA CHECK NOTED  02/17/2015 04:09 PM 8.09 (H) 0.44 - 1.00 mg/dL Final  02/17/2015 05:16 AM 7.08 (H) 0.44 - 1.00 mg/dL Final  02/16/2015 02:40 PM 5.53 (H) 0.44 - 1.00 mg/dL Final  02/10/2015 07:41 PM 3.56 (H) 0.44 - 1.00 mg/dL Final  02/02/2015 06:57 AM 5.34 (H) 0.44 - 1.00 mg/dL Final  02/01/2015 10:51 PM 4.30 (H) 0.44 - 1.00 mg/dL Final   Recent Labs  Lab 09/02/21 1507 09/03/21 0249  NA 135 137  K 4.6 5.1  CL 93* 96*  CO2 24 21*  GLUCOSE 80 80  BUN 39* 51*  CREATININE 10.25* 11.73*  CALCIUM 10.5* 9.4  PHOS  --  9.7*   Recent Labs  Lab 09/02/21 1507 09/03/21 0249  WBC 7.3 7.8  NEUTROABS 5.4  --   HGB 11.2* 10.4*  HCT 35.1* 32.0*  MCV 101.2* 102.2*  PLT 150 128*   Liver Function Tests: Recent Labs  Lab 09/02/21 1507 09/03/21 0249  AST 38  --   ALT 25  --   ALKPHOS 52  --   BILITOT 1.2  --   PROT 7.3  --   ALBUMIN 3.6 3.1*   No results for input(s): LIPASE, AMYLASE in the last 168 hours. No results for input(s): AMMONIA in the last 168 hours. Cardiac Enzymes: No results for input(s): CKTOTAL, CKMB, CKMBINDEX, TROPONINI in the last 168 hours. Iron Studies: No results for input(s): IRON, TIBC, TRANSFERRIN, FERRITIN in the last 72 hours. PT/INR: @LABRCNTIP (inr:5)  Xrays/Other Studies: ) Results for orders placed or performed during the hospital encounter of 09/02/21 (from the past 48 hour(s))  CBC with Differential     Status: Abnormal   Collection Time: 09/02/21  3:07 PM  Result Value Ref Range   WBC 7.3 4.0 - 10.5 K/uL   RBC 3.47 (L) 3.87 - 5.11 MIL/uL   Hemoglobin 11.2 (L) 12.0 - 15.0 g/dL   HCT 35.1 (L) 36.0 - 46.0 %   MCV 101.2 (H) 80.0 - 100.0 fL   MCH 32.3 26.0  - 34.0 pg   MCHC 31.9 30.0 -  36.0 g/dL   RDW 21.2 (H) 11.5 - 15.5 %   Platelets 150 150 - 400 K/uL   nRBC 0.0 0.0 - 0.2 %   Neutrophils Relative % 74 %   Neutro Abs 5.4 1.7 - 7.7 K/uL   Lymphocytes Relative 19 %   Lymphs Abs 1.4 0.7 - 4.0 K/uL   Monocytes Relative 5 %   Monocytes Absolute 0.4 0.1 - 1.0 K/uL   Eosinophils Relative 1 %   Eosinophils Absolute 0.1 0.0 - 0.5 K/uL   Basophils Relative 1 %   Basophils Absolute 0.1 0.0 - 0.1 K/uL   WBC Morphology MORPHOLOGY UNREMARKABLE    RBC Morphology See Note     Comment: MACROCYTOSIS PRESENT   Smear Review MORPHOLOGY UNREMARKABLE    Immature Granulocytes 0 %   Abs Immature Granulocytes 0.02 0.00 - 0.07 K/uL    Comment: Performed at [facility], [place] [address] [place], [place] [address]  Comprehensive metabolic panel     Status: Abnormal   Collection Time: 09/02/21  3:07 PM  Result Value Ref Range   Sodium 135 135 - 145 mmol/L   Potassium 4.6 3.5 - 5.1 mmol/L   Chloride 93 (L) 98 - 111 mmol/L   CO2 24 22 - 32 mmol/L   Glucose, Bld 80 70 - 99 mg/dL    Comment: Glucose reference range applies only to samples taken after fasting for at least 8 hours.   BUN 39 (H) 6 - 20 mg/dL   Creatinine, Ser 10.25 (H) 0.44 - 1.00 mg/dL   Calcium 10.5 (H) 8.9 - 10.3 mg/dL   Total Protein 7.3 6.5 - 8.1 g/dL   Albumin 3.6 3.5 - 5.0 g/dL   AST 38 15 - 41 U/L   ALT 25 0 - 44 U/L   Alkaline Phosphatase 52 38 - 126 U/L   Total Bilirubin 1.2 0.3 - 1.2 mg/dL   GFR, Estimated 5 (L) >60 mL/min    Comment: (NOTE) Calculated using the CKD-EPI Creatinine Equation (2021)    Anion gap 18 (H) 5 - 15    Comment: Performed at [facility], [place] [address], [place], [place] [address]  Magnesium     Status: None   Collection Time: 09/02/21  3:07 PM  Result Value Ref Range   Magnesium 2.3 1.7 - 2.4 mg/dL    Comment: Performed at [facility], [place] [address], [place], [place] [address]  Troponin I (High Sensitivity)     Status: Abnormal    Collection Time: 09/02/21  3:47 PM  Result Value Ref Range   Troponin I (High Sensitivity) 138 (HH) <18 ng/L    Comment: CRITICAL VALUE NOTED.  VALUE IS CONSISTENT WITH PREVIOUSLY REPORTED AND CALLED VALUE. (NOTE) Elevated high sensitivity troponin I (hsTnI) values and significant  changes across serial measurements may suggest ACS but many other  chronic and acute conditions are known to elevate hsTnI results.  Refer to the Links section for chest pain algorithms and additional  guidance. Performed at [facility], [place] [address], [place], [place] [address]   hCG, serum, qualitative     Status: None   Collection Time: 09/02/21  3:47 PM  Result Value Ref Range   Preg, Serum NEGATIVE NEGATIVE    Comment:        THE SENSITIVITY OF THIS METHODOLOGY IS >10 mIU/mL. Performed at [facility], [place] [address] [place], [place] [address]   Troponin I (High Sensitivity)  Status: Abnormal   Collection Time: 09/02/21  5:39 PM  Result Value Ref Range   Troponin I (High Sensitivity) 143 (HH) <18 ng/L    Comment: CRITICAL VALUE NOTED.  VALUE IS CONSISTENT WITH PREVIOUSLY REPORTED AND CALLED VALUE. (NOTE) Elevated high sensitivity troponin I (hsTnI) values and significant  changes across serial measurements may suggest ACS but many other  chronic and acute conditions are known to elevate hsTnI results.  Refer to the Links section for chest pain algorithms and additional  guidance. Performed at [facility], [place] [address] [place], [place] [address]   Glucose, capillary     Status: None   Collection Time: 09/02/21  9:52 PM  Result Value Ref Range   Glucose-Capillary 99 70 - 99 mg/dL    Comment: Glucose reference range applies only to samples taken after fasting for at least 8 hours.  MRSA Next Gen by PCR, Nasal     Status: None   Collection Time: 09/02/21 10:12 PM   Specimen: Nasal Mucosa; Nasal Swab  Result Value Ref Range   MRSA by PCR Next Gen NOT DETECTED NOT  DETECTED    Comment: (NOTE) The GeneXpert MRSA Assay (FDA approved for NASAL specimens only), is one component of a comprehensive MRSA colonization surveillance program. It is not intended to diagnose MRSA infection nor to guide or monitor treatment for MRSA infections. Test performance is not FDA approved in patients less than 28 years old. Performed at [facility], [place] [address] [place], [place] [address]   Glucose, capillary     Status: None   Collection Time: 09/02/21 11:21 PM  Result Value Ref Range   Glucose-Capillary 92 70 - 99 mg/dL    Comment: Glucose reference range applies only to samples taken after fasting for at least 8 hours.  Renal function panel     Status: Abnormal   Collection Time: 09/03/21  2:49 AM  Result Value Ref Range   Sodium 137 135 - 145 mmol/L   Potassium 5.1 3.5 - 5.1 mmol/L   Chloride 96 (L) 98 - 111 mmol/L   CO2 21 (L) 22 - 32 mmol/L   Glucose, Bld 80 70 - 99 mg/dL    Comment: Glucose reference range applies only to samples taken after fasting for at least 8 hours.   BUN 51 (H) 6 - 20 mg/dL   Creatinine, Ser 11.73 (H) 0.44 - 1.00 mg/dL   Calcium 9.4 8.9 - 10.3 mg/dL   Phosphorus 9.7 (H) 2.5 - 4.6 mg/dL   Albumin 3.1 (L) 3.5 - 5.0 g/dL   GFR, Estimated 4 (L) >60 mL/min    Comment: (NOTE) Calculated using the CKD-EPI Creatinine Equation (2021)    Anion gap 20 (H) 5 - 15    Comment: Performed at [facility] [address] [place], [place] [address]  CBC     Status: Abnormal   Collection Time: 09/03/21  2:49 AM  Result Value Ref Range   WBC 7.8 4.0 - 10.5 K/uL   RBC 3.13 (L) 3.87 - 5.11 MIL/uL   Hemoglobin 10.4 (L) 12.0 - 15.0 g/dL   HCT 32.0 (L) 36.0 - 46.0 %   MCV 102.2 (H) 80.0 - 100.0 fL   MCH 33.2 26.0 - 34.0 pg   MCHC 32.5 30.0 - 36.0 g/dL   RDW 21.0 (H) 11.5 - 15.5 %   Platelets 128 (L) 150 - 400 K/uL   nRBC 0.4 (H) 0.0 - 0.2 %  Comment: Performed at [facility], [place] [address], [place], [place] [address]   Glucose, capillary     Status: None   Collection Time: 09/03/21  3:33 AM  Result Value Ref Range   Glucose-Capillary 77 70 - 99 mg/dL    Comment: Glucose reference range applies only to samples taken after fasting for at least 8 hours.  Glucose, capillary     Status: None   Collection Time: 09/03/21  7:33 AM  Result Value Ref Range   Glucose-Capillary 72 70 - 99 mg/dL    Comment: Glucose reference range applies only to samples taken after fasting for at least 8 hours.   DG Chest 2 View  Result Date: 09/02/2021 CLINICAL DATA:  Headaches and blurry vision.  Vomiting.  Chest pain EXAM: CHEST - 2 VIEW COMPARISON:  08/20/2021 FINDINGS: Apical lordotic portable frontal radiograph. Numerous leads and wires project over the chest. Moderate cardiomegaly. No definite pleural fluid. Interstitial prominence is accentuated by low lung volumes. Increased bibasilar density on the frontal view is favored to be secondary to apical lordotic, portable technique. No correlate airspace disease on the lateral. IMPRESSION: Cardiomegaly with mild pulmonary interstitial prominence, favoring pulmonary venous congestion. Electronically Signed   By: Abigail Miyamoto M.D.   On: 09/02/2021 16:19   CT Head Wo Contrast  Result Date: 09/02/2021 CLINICAL DATA:  36 year old female with acute headache. EXAM: CT HEAD WITHOUT CONTRAST TECHNIQUE: Contiguous axial images were obtained from the base of the skull through the vertex without intravenous contrast. RADIATION DOSE REDUCTION: This exam was performed according to the departmental dose-optimization program which includes automated exposure control, adjustment of the mA and/or kV according to patient size and/or use of iterative reconstruction technique. COMPARISON:  08/08/2021 FINDINGS: Brain: A 1.5 cm peripheral RIGHT occipital hypodensity is nonspecific but suspicious for an acute to subacute infarct. No other significant intracranial abnormalities are identified. There is no  evidence of midline shift, hemorrhage, hydrocephalus or extra-axial collection. Vascular: No hyperdense vessel or unexpected calcification. Skull: Normal. Negative for fracture or focal lesion. Sinuses/Orbits: No acute finding. Other: None IMPRESSION: 1. 1.5 cm peripheral RIGHT occipital hypodensity, nonspecific but suspicious for acute to subacute infarct. Recommend MRI for further evaluation. 2. No evidence of hemorrhage. Electronically Signed   By: Margarette Canada M.D.   On: 09/02/2021 15:27   MR BRAIN WO CONTRAST  Result Date: 09/02/2021 CLINICAL DATA:  Initial evaluation for acute headache. Severe hypertension. EXAM: MRI HEAD WITHOUT CONTRAST TECHNIQUE: Multiplanar, multiecho pulse sequences of the brain and surrounding structures were obtained without intravenous contrast. COMPARISON:  Prior head CT from earlier the same day. FINDINGS: Brain: Cerebral volume within normal limits. Patchy multifocal T2/FLAIR signal abnormality seen involving the cortical and subcortical aspects of the right greater than left occipital poles (series 11, image 10). Findings correspond with abnormality on prior CT. 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 No associated blood products or significant regional mass effect. No other evidence for acute or subacute ischemia. Gray-white matter differentiation otherwise maintained. No acute intracranial hemorrhage. Chronic microhemorrhage noted at the right midbrain. No other acute or chronic intracranial blood products. Probable underlying mild chronic microvascular ischemic disease noted. No mass lesion or midline shift. No hydrocephalus or extra-axial fluid collection. Pituitary gland suprasellar region within normal limits. Midline structures intact. Vascular: Major intracranial vascular flow voids are maintained. Skull and upper cervical spine: Craniocervical junction normal. Diffusely decreased T1 signal intensity seen throughout the  visualized bone marrow, likely related  history of end-stage renal disease. No focal marrow replacing lesion. No scalp soft tissue abnormality. Sinuses/Orbits: Globes and orbital soft tissues within normal limits. Paranasal sinuses are clear. No mastoid effusion. Inner ear structures grossly normal. Other: None. IMPRESSION: 1. Patchy multifocal T2/FLAIR signal abnormality involving the cortical and subcortical aspects of the right greater than left occipital poles, most consistent with PRES. No associated blood products or significant regional mass effect. 2. No other acute intracranial abnormality. 3. Underlying mild chronic microvascular ischemic disease. Electronically Signed   By: Jeannine Boga M.D.   On: 09/02/2021 22:06    PMH:   Past Medical History:  Diagnosis Date   ESRD on hemodialysis ([place])    started HD in 2010, has had kidney transplant x 2. 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 and is back on HD MWF   History of kidney transplant    1st transplant was 2012- 2015, 2nd transplant was 2018- 2022 approx   Hypertension    Multiple gastric ulcers    Pneumonia 01/23/2015    PSH:   Past Surgical History:  Procedure Laterality Date   ESOPHAGOGASTRODUODENOSCOPY N/A 04/01/2016   Procedure: ESOPHAGOGASTRODUODENOSCOPY (EGD);  Surgeon: Gatha Mayer, MD;  Location[facility] ENDOSCOPY;  Service: Endoscopy;  Laterality: N/A;   ESOPHAGOGASTRODUODENOSCOPY (EGD) WITH PROPOFOL N/A 11/20/2016   Procedure: ESOPHAGOGASTRODUODENOSCOPY (EGD) WITH PROPOFOL;  Surgeon: Doran Stabler, MD;  Location[place];  Service: Endoscopy;  Laterality: N/A;   KIDNEY TRANSPLANT Bilateral 2012    Allergies:  Allergies  Allergen Reactions   Tape Itching and Other (See Comments)    Plastic/clear tape tears up the patient's skin   Zosyn [Piperacillin Sod-Tazobactam So] Itching    Has patient had a PCN reaction causing immediate rash, facial/tongue/throat swelling, SOB or lightheadedness with hypotension: No Has patient had  a PCN reaction causing severe rash involving mucus membranes or skin necrosis: No Has patient had a PCN reaction that required hospitalization: No Has patient had a PCN reaction occurring within the last 10 years: No If all of the above answers are "NO", then may proceed with Cephalosporin use.     Medications:   Prior to Admission medications   Medication Sig Start Date End Date Taking? Authorizing Provider  b complex-vitamin c-folic acid (NEPHRO-VITE) 0.8 MG TABS tablet Take 1 tablet by mouth daily. 10/24/14   [provider]  calcitRIOL (ROCALTROL) 0.25 MCG capsule Take 0.5 mcg by mouth daily after breakfast.    [provider]  carvedilol (COREG) 25 MG tablet Take 25 mg by mouth 2 (two) times daily. 07/27/21   [provider]  Cholecalciferol (VITAMIN D3) 25 MCG (1000 UT) CAPS Take 1,000 Units by mouth in the morning.    [provider]  Cyanocobalamin (VITAMIN B12) 1000 MCG TBCR Take 1,000 mcg by mouth daily.    [provider]  Drospirenone (SLYND) 4 MG TABS Take 1 tablet by mouth daily with breakfast. Patient taking differently: Take 4 mg by mouth daily with breakfast. 12/11/20   Shelly Bombard, MD  fluticasone [facility]) 50 MCG/ACT nasal spray Place 1 spray into both nostrils daily as needed for allergies or rhinitis. 01/07/21   Rehman, Areeg N, DO  labetalol (NORMODYNE) 300 MG tablet Take 1 tablet (300 mg total) by mouth 2 (two) times daily. Patient taking differently: Take 300 mg by mouth 3 (three) times daily. 05/24/21   Ghimire, Henreitta Leber, MD  losartan (COZAAR) 100 MG tablet Take 1 tablet (100 mg total) by mouth daily. 05/24/21 05/24/22  Ghimire, Henreitta Leber, MD  mirtazapine (REMERON) 15 MG tablet Take 15 mg by mouth in the morning and at bedtime. 11/01/20 11/01/21  [provider]  NIFEdipine (PROCARDIA XL/NIFEDICAL-XL) 90 MG 24 hr tablet Take 1 tablet (90 mg total) by mouth daily. Patient taking differently: Take 90 mg by mouth at  bedtime. 12/15/20 09/29/21  Virl Axe, MD  pantoprazole (PROTONIX) 40 MG tablet Take 1 tablet (40 mg total) by mouth at bedtime. 05/24/21   Ghimire, Henreitta Leber, MD  amLODipine (NORVASC) 10 MG tablet Take 1 tablet (10 mg total) by mouth every evening. Patient not taking: No sig reported 09/13/15 12/14/20  [name], MD    Discontinued Meds:   Medications Discontinued During This Encounter  Medication Reason   fentaNYL (SUBLIMAZE) injection 25 mcg Inpatient Standard    Social History:  reports that she has never smoked. She has never used smokeless tobacco. She reports that she does not drink alcohol and does not use drugs.  Family History:   Family History  Problem Relation Age of Onset   Kidney disease Father    Lupus Maternal Grandmother    Cancer Maternal Grandfather    Colon cancer Neg Hx    Stomach cancer Neg Hx    Rectal cancer Neg Hx    Esophageal cancer Neg Hx    Liver cancer Neg Hx     Blood pressure (!) 193/122, pulse 87, temperature 97.9 F (36.6 C), temperature source Oral, resp. rate 17, height 5' (1.524 m), weight 57.8 kg, SpO2 91 %.  Gen alert, no distress No rash, cyanosis or gangrene Sclera anicteric, throat clear  No jvd or bruits Chest clear bilat to bases, no rales/ wheezing RRR no MRG Abd soft ntnd no mass or ascites +bs GU normal MS no joint effusions or deformity Ext no LE or UE edema, no wounds or ulcers Neuro is alert, Ox 3 , nf Access: lt [place]im[place] good thrill      Dwana Melena, MD 09/03/2021, 8:18 AM

## 2021-09-03 NOTE — Progress Notes (Signed)
?  Echocardiogram ?2D Echocardiogram has been performed. ? ?Kerry Cook ?09/03/2021, 3:20 PM ?
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
## 2021-09-04 ENCOUNTER — Inpatient Hospital Stay (HOSPITAL_COMMUNITY): Payer: Medicare Other

## 2021-09-04 DIAGNOSIS — I6783 Posterior reversible encephalopathy syndrome: Secondary | ICD-10-CM | POA: Diagnosis not present

## 2021-09-04 DIAGNOSIS — I161 Hypertensive emergency: Secondary | ICD-10-CM | POA: Diagnosis not present

## 2021-09-04 LAB — GLUCOSE, CAPILLARY
Glucose-Capillary: 125 mg/dL — ABNORMAL HIGH (ref 70–99)
Glucose-Capillary: 113 mg/dL — ABNORMAL HIGH (ref 70–99)
Glucose-Capillary: 95 mg/dL (ref 70–99)
Glucose-Capillary: 76 mg/dL (ref 70–99)

## 2021-09-04 LAB — CBC
HCT: 32.5 % — ABNORMAL LOW (ref 36.0–46.0)
Hemoglobin: 10.5 g/dL — ABNORMAL LOW (ref 12.0–15.0)
MCH: 32.6 pg (ref 26.0–34.0)
MCHC: 32.3 g/dL (ref 30.0–36.0)
MCV: 100.9 fL — ABNORMAL HIGH (ref 80.0–100.0)
Platelets: 138 10*3/uL — ABNORMAL LOW (ref 150–400)
RBC: 3.22 MIL/uL — ABNORMAL LOW (ref 3.87–5.11)
RDW: 20.4 % — ABNORMAL HIGH (ref 11.5–15.5)
WBC: 9.6 10*3/uL (ref 4.0–10.5)
nRBC: 0.5 % — ABNORMAL HIGH (ref 0.0–0.2)

## 2021-09-04 LAB — BASIC METABOLIC PANEL
Anion gap: 16 — ABNORMAL HIGH (ref 5–15)
BUN: 66 mg/dL — ABNORMAL HIGH (ref 6–20)
CO2: 22 mmol/L (ref 22–32)
Calcium: 9.2 mg/dL (ref 8.9–10.3)
Chloride: 95 mmol/L — ABNORMAL LOW (ref 98–111)
Creatinine, Ser: 13.95 mg/dL — ABNORMAL HIGH (ref 0.44–1.00)
GFR, Estimated: 3 mL/min — ABNORMAL LOW (ref >60)
Glucose, Bld: 116 mg/dL — ABNORMAL HIGH (ref 70–99)
Potassium: 5.2 mmol/L — ABNORMAL HIGH (ref 3.5–5.1)
Sodium: 133 mmol/L — ABNORMAL LOW (ref 135–145)

## 2021-09-04 LAB — PHOSPHORUS: Phosphorus: 8.5 mg/dL — ABNORMAL HIGH (ref 2.5–4.6)

## 2021-09-04 LAB — MAGNESIUM: Magnesium: 2.7 mg/dL — ABNORMAL HIGH (ref 1.7–2.4)

## 2021-09-04 MED ORDER — HYDRALAZINE HCL 25 MG PO TABS
25.0000 mg | ORAL_TABLET | Freq: Once | ORAL | Status: AC
  Administered 2021-09-04: 25 mg via ORAL
  Filled 2021-09-04: qty 1

## 2021-09-04 MED ORDER — HYDRALAZINE HCL 50 MG PO TABS
50.0000 mg | ORAL_TABLET | Freq: Three times a day (TID) | ORAL | Status: DC | PRN
  Administered 2021-09-05 – 2021-09-06 (×2): 50 mg via ORAL
  Filled 2021-09-04 (×2): qty 1

## 2021-09-04 MED ORDER — HYDRALAZINE HCL 25 MG PO TABS
25.0000 mg | ORAL_TABLET | Freq: Three times a day (TID) | ORAL | Status: DC | PRN
Start: 2021-09-04 — End: 2021-09-04
  Administered 2021-09-04: 25 mg via ORAL
  Filled 2021-09-04: qty 1

## 2021-09-04 NOTE — Assessment & Plan Note (Signed)
Hemoglobin 10.5, stable. ?

## 2021-09-04 NOTE — Assessment & Plan Note (Addendum)
High sensitive troponin on admission elevated 138> 143.  EKG with no concerning ST elevation/depression or T wave inversions, no dynamic changes.  TTE with LVEF 60-65%, LV normal function, LV with no regional wall motion normalities, concentric LVH, grade 2 diastolic dysfunction, LA severely dilated, mild MR, no aortic stenosis, small pericardial effusion; no significant change from previous TTE 08/20/2021.  Etiology likely secondary to type II demand ischemia in the setting of hypertensive emergency/PRES. ?

## 2021-09-04 NOTE — Assessment & Plan Note (Addendum)
Nephrology was consulted and followed during hospital course for continued HD while inpatient.  Continue hemodialysis outpatient on a Monday/Wednesday/Friday schedule. ?
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
## 2021-09-04 NOTE — Assessment & Plan Note (Addendum)
Patient presenting to the ED with headache, vision loss with nausea/vomiting.  Patient was noted to have an elevated SBP of 264 on admission.  CT head concerning for right occipital acute versus subacute infarct.  MR brain with patchy multifocal T2/flair signal abnormality cortical/subcortical right greater than left occipital lobe consistent with PRES. etiology likely secondary to noncompliance with home antihypertensives.  Patient was initially started on a Cardene drip in her home and at bedtime versus were resumed.  Cardene drip was titrated off. Continue Nifedipine 90 mg p.o. daily, Losartan 100 mg p.o. daily, Carvedilol 25 mg p.o. twice daily, clonidine patch 0.3 mg weekly.  Outpatient follow-up with PCP. ?

## 2021-09-04 NOTE — Progress Notes (Signed)
Patient seen and examined in HD ? ?Subjective: ?Some improvement in her vision, but still blurry.  ? ?Exam: ?Vitals:  ? 09/04/21 0728 09/04/21 0800  ?BP: 138/83 140/88  ?Pulse: 81 84  ?Resp: 16 15  ?Temp:    ?SpO2:    ? ?Gen: In bed, NAD ?Resp: non-labored breathing, no acute distress ?Abd: soft, nt ? ?Neuro: ?MS: awake, alert, oriented ?CN: She is able to identify finger wiggling in all 4 quadrants, but some difficulty with finger counting. Face symmetric ?Motor: able to hold BUE without drift, no weakness to confrontation proximally in legs.  ?Sensory:intact to LT ? ?Pertinent Labs: ?BUN 66 ? ?Impression: 36 yo F presenting with hypertensive emergency and MRI findings consistent with PRES. Her BPs have been better managed and she reports some improvement in her vision. I would expect continued gradual improvement over the next week or two. Management at this point continues to be hypertension control. Given the she is already near normotension, I would continue this.  ? ?Recommendations: ?1) BP goal < 140 ?2) Neurology will be available as needed.  ? ?Roland Rack, MD ?[facility] ?[phone] ? ?If 7pm- 7am, please page neurology on call as listed in [place]. ? ?

## 2021-09-04 NOTE — Assessment & Plan Note (Addendum)
Resolved with hemodialysis.  Potassium 3.9 at time of discharge. ?

## 2021-09-04 NOTE — Assessment & Plan Note (Addendum)
Presenting with SBP of 264.  Noncompliant with home antihypertensives.  Initially started on a Cardene drip which has been titrated off. Nifedipine 90 mg p.o. daily, Losartan 100 mg p.o. daily, Carvedilol 25 mg p.o. twice daily, clonidine patch 0.3 mg weekly. ?

## 2021-09-04 NOTE — Progress Notes (Signed)
?[facility] ?Progress Note  ? ?36 y.o. female hx of HTN, failed renal transplant x 2, anemia ckd/ other, ESRD on HD MWF presented to ED yesterday evening with headache, dizziness and vomiting.  She also complained of blurry vision which was the primary reason for presentation to the ED. In ED BP very high ~ 264/170 and K+ 5.1 IV Cardene was started and CT  head showed a 1.4cm peripheral rt occipital hypodensity. Patient noted to be somnolent but arousable and following commands. She admits to frequently missing doses of her antihypertensive medications.   ? ?Assessment/ Plan:   ?  ?OP HD: MWF AF ?3h 43min  400/500  Hep 3500  LFA AVF ?Went to past 3 treatments; left at 56kg on Fri (3/10) ?UFP 2 ?Hectorol 10 qtx ?Mircera 225 q2w given 3/1 ?  ?  ?Assessment/ Plan: ?SOB/ mild pulm edema - due to severe uncont HTN. Not vol overloaded on exam, not in distress. On IV cardene in ED. Will plan HD today when pt is in ICU bed.  ?Hyperkalemia - K 5.1,  HD today ?Uncont HTN - missed home meds x 24 hrs, severe HTN in ED 230/ 160. Getting IV cardene and BP's coming down now.  ?ESRD - usual HD MWF.  ?Seen on HD ?2K bath 134/71 2L net UF as tolerated ? ?Unfortunately bec of staffing issues she was moved to 1st shift this AM. ?Will plan on HD Wed to resume MWF regimen if she's still here. ? 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  ? ?Subjective:   ?Feeling better but still has h/a and blurry vision; of note her glasses broke many months ago as well. ?Denies fever, chills, nausea or dyspnea.  ? ?Objective:   ?BP 110/76   Pulse 79   Temp 97.7 ?F (36.5 ?C) (Oral)   Resp 13   Ht 5' (1.524 m)   Wt 57.8 kg   SpO2 93%   BMI 24.89 kg/m?  ? ?Intake/Output Summary (Last 24 hours) at 09/04/2021 0934 ?Last data filed at 09/03/2021 2100 ?Gross per 24 hour  ?Intake 753.49 ml  ?Output --  ?Net 753.49 ml  ? ?Weight  change: -0.368 kg ? ?Physical Exam: ?Gen alert, no distress ?Sclera anicteric, throat clear  ?No jvd or bruits ?Chest clear bilat to bases ?RRR no MRG ?Abd soft ntnd no mass or ascites +bs ?Ext no LE or UE edema, no wounds or ulcers ?Neuro is alert, Ox 3 , nf ?Access: lt [place] good thrill ? ?Imaging: ?DG Chest 2 View ? ?Result Date: 09/02/2021 ?CLINICAL DATA:  Headaches and blurry vision.  Vomiting.  Chest pain EXAM: CHEST - 2 VIEW COMPARISON:  08/20/2021 FINDINGS: Apical lordotic portable frontal radiograph. Numerous leads and wires project over the chest. Moderate cardiomegaly. No definite pleural fluid. Interstitial prominence is accentuated by low lung volumes. Increased bibasilar density on the frontal view is favored to be secondary to apical lordotic, portable technique. No correlate airspace disease on the lateral. IMPRESSION: Cardiomegaly with mild pulmonary interstitial prominence, favoring pulmonary venous congestion. Electronically Signed   By: Abigail Miyamoto M.D.   On: 09/02/2021 16:19  ? ?CT Head Wo Contrast ? ?Result Date: 09/02/2021 ?CLINICAL DATA:  36 year old female with acute headache. EXAM: CT HEAD WITHOUT CONTRAST TECHNIQUE: Contiguous axial images were obtained from the base of the skull through  the vertex without intravenous contrast. RADIATION DOSE REDUCTION: This exam was performed according to the departmental dose-optimization program which includes automated exposure control, adjustment of the mA and/or kV according to patient size and/or use of iterative reconstruction technique. COMPARISON:  08/08/2021 FINDINGS: Brain: A 1.5 cm peripheral RIGHT occipital hypodensity is nonspecific but suspicious for an acute to subacute infarct. No other significant intracranial abnormalities are identified. There is no evidence of midline shift, hemorrhage, hydrocephalus or extra-axial collection. Vascular: No hyperdense vessel or unexpected calcification. Skull: Normal. Negative for fracture or focal  lesion. Sinuses/Orbits: No acute finding. Other: None IMPRESSION: 1. 1.5 cm peripheral RIGHT occipital hypodensity, nonspecific but suspicious for acute to subacute infarct. Recommend MRI for further evaluation. 2. No evidence of hemorrhage. Electronically Signed   By: Margarette Canada M.D.   On: 09/02/2021 15:27  ? ?MR BRAIN WO CONTRAST ? ?Result Date: 09/02/2021 ?CLINICAL DATA:  Initial evaluation for acute headache. Severe hypertension. EXAM: MRI HEAD WITHOUT CONTRAST TECHNIQUE: Multiplanar, multiecho pulse sequences of the brain and surrounding structures were obtained without intravenous contrast. COMPARISON:  Prior head CT from earlier the same day. FINDINGS: Brain: Cerebral volume within normal limits. Patchy multifocal T2/FLAIR signal abnormality seen involving the cortical and subcortical aspects of the right greater than left occipital poles (series 11, image 10). Findings correspond with abnormality on prior CT. 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 No associated blood products or significant regional mass effect. No other evidence for acute or subacute ischemia. Gray-white matter differentiation otherwise maintained. No acute intracranial hemorrhage. Chronic microhemorrhage noted at the right midbrain. No other acute or chronic intracranial blood products. Probable underlying mild chronic microvascular ischemic disease noted. No mass lesion or midline shift. No hydrocephalus or extra-axial fluid collection. Pituitary gland suprasellar region within normal limits. Midline structures intact. Vascular: Major intracranial vascular flow voids are maintained. Skull and upper cervical spine: Craniocervical junction normal. Diffusely decreased T1 signal intensity seen throughout the visualized bone marrow, likely related history of end-stage renal disease. No focal marrow replacing lesion. No scalp soft tissue abnormality. Sinuses/Orbits: Globes and orbital soft tissues  within normal limits. Paranasal sinuses are clear. No mastoid effusion. Inner ear structures grossly normal. Other: None. IMPRESSION: 1. Patchy multifocal T2/FLAIR signal abnormality involving the cortical and subcortical aspects of the right greater than left occipital poles, most consistent with PRES. No associated blood products or significant regional mass effect. 2. No other acute intracranial abnormality. 3. Underlying mild chronic microvascular ischemic disease. Electronically Signed   By: Jeannine Boga M.D.   On: 09/02/2021 22:06  ? ?ECHOCARDIOGRAM COMPLETE ? ?Result Date: 09/03/2021 ?   ECHOCARDIOGRAM REPORT   Patient Name:   DEASHIA SOULE Sheeler Date of Exam: 09/03/2021 Medical Rec #:  [id number]          Height:       60.0 in Accession #:    [id number]         Weight:       127.4 lb Date of Birth:  1985/11/05           BSA:          1.541 m? Patient Age:    35 years           BP:           161/99 mmHg Patient Gender: F                  HR:  77 bpm. Exam Location:  Inpatient Procedure: 2D Echo, Cardiac Doppler and Color Doppler Indications:     Elevated Troponin  History:         Patient has prior history of Echocardiogram examinations, most                  recent 08/20/2021. Risk Factors:Hypertension.  Sonographer:     Bernadene Person RDCS Referring Phys:  Toney Sang SOOD Diagnosing Phys: Fransico Him MD IMPRESSIONS  1. Left ventricular ejection fraction, by estimation, is 60 to 65%. The left ventricle has normal function. The left ventricle has no regional wall motion abnormalities. There is severe concentric left ventricular hypertrophy. Left ventricular diastolic  parameters are consistent with Grade II diastolic dysfunction (pseudonormalization). Elevated left ventricular end-diastolic pressure. The average left ventricular global longitudinal strain is -22.1 %. The global longitudinal strain is normal.  2. Right ventricular systolic function is normal. The right ventricular size is normal.  There is mildly elevated pulmonary artery systolic pressure. The estimated right ventricular systolic pressure is 52.4 mmHg.  3. Left atrial size was severely dilated.  4. The mitral valve is normal in structure. M
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
## 2021-09-04 NOTE — Assessment & Plan Note (Addendum)
Chest x-ray with pulmonary edema.  Patient was dialyzed during hospitalization with removal of excessive fluid.  Oxygen was titrated off at time of discharge.  Continue volume management with HD. ?

## 2021-09-05 DIAGNOSIS — I517 Cardiomegaly: Secondary | ICD-10-CM

## 2021-09-05 DIAGNOSIS — I6783 Posterior reversible encephalopathy syndrome: Secondary | ICD-10-CM | POA: Diagnosis not present

## 2021-09-05 DIAGNOSIS — R441 Visual hallucinations: Secondary | ICD-10-CM | POA: Diagnosis not present

## 2021-09-05 LAB — RENAL FUNCTION PANEL
Albumin: 3.3 g/dL — ABNORMAL LOW (ref 3.5–5.0)
Anion gap: 12 (ref 5–15)
BUN: 24 mg/dL — ABNORMAL HIGH (ref 6–20)
CO2: 25 mmol/L (ref 22–32)
Calcium: 9.3 mg/dL (ref 8.9–10.3)
Chloride: 96 mmol/L — ABNORMAL LOW (ref 98–111)
Creatinine, Ser: 7.94 mg/dL — ABNORMAL HIGH (ref 0.44–1.00)
GFR, Estimated: 6 mL/min — ABNORMAL LOW (ref >60)
Glucose, Bld: 95 mg/dL (ref 70–99)
Phosphorus: 4.5 mg/dL (ref 2.5–4.6)
Potassium: 3.5 mmol/L (ref 3.5–5.1)
Sodium: 133 mmol/L — ABNORMAL LOW (ref 135–145)

## 2021-09-05 LAB — GLUCOSE, CAPILLARY
Glucose-Capillary: 108 mg/dL — ABNORMAL HIGH (ref 70–99)
Glucose-Capillary: 71 mg/dL (ref 70–99)
Glucose-Capillary: 91 mg/dL (ref 70–99)
Glucose-Capillary: 96 mg/dL (ref 70–99)

## 2021-09-05 LAB — CBC
HCT: 30.4 % — ABNORMAL LOW (ref 36.0–46.0)
Hemoglobin: 10 g/dL — ABNORMAL LOW (ref 12.0–15.0)
MCH: 33.2 pg (ref 26.0–34.0)
MCHC: 32.9 g/dL (ref 30.0–36.0)
MCV: 101 fL — ABNORMAL HIGH (ref 80.0–100.0)
Platelets: 160 10*3/uL (ref 150–400)
RBC: 3.01 MIL/uL — ABNORMAL LOW (ref 3.87–5.11)
RDW: 20.1 % — ABNORMAL HIGH (ref 11.5–15.5)
WBC: 5.3 10*3/uL (ref 4.0–10.5)
nRBC: 0 % (ref 0.0–0.2)

## 2021-09-05 MED ORDER — LABETALOL HCL 5 MG/ML IV SOLN
10.0000 mg | INTRAVENOUS | Status: DC | PRN
  Administered 2021-09-05: 10 mg via INTRAVENOUS
  Filled 2021-09-05 (×2): qty 4

## 2021-09-05 MED ORDER — HYDRALAZINE HCL 50 MG PO TABS
50.0000 mg | ORAL_TABLET | Freq: Once | ORAL | Status: AC
  Administered 2021-09-05: 50 mg via ORAL
  Filled 2021-09-05: qty 1

## 2021-09-05 MED ORDER — HALOPERIDOL LACTATE 5 MG/ML IJ SOLN
1.0000 mg | Freq: Four times a day (QID) | INTRAMUSCULAR | Status: DC | PRN
  Administered 2021-09-05: 1 mg via INTRAVENOUS
  Filled 2021-09-05: qty 1

## 2021-09-05 MED ORDER — PANTOPRAZOLE SODIUM 40 MG PO TBEC
40.0000 mg | DELAYED_RELEASE_TABLET | Freq: Every day | ORAL | Status: DC
  Administered 2021-09-05 – 2021-09-06 (×2): 40 mg via ORAL
  Filled 2021-09-05 (×2): qty 1

## 2021-09-05 MED ORDER — MIRTAZAPINE 15 MG PO TABS
15.0000 mg | ORAL_TABLET | Freq: Every day | ORAL | Status: DC
  Administered 2021-09-06: 15 mg via ORAL
  Filled 2021-09-05 (×2): qty 1
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
## 2021-09-05 NOTE — Progress Notes (Signed)
Upon report from night RN, about 0720, and entering the room, the patient is restless complaining of "seeing things."  ? ?Patient reports "seeing a face on the wall" "the cup changing colors" "I want to go home" "I am scared." BP 135/91 (105). I immediately page MD, Dr. Eric [place] to bedside. ? ?Patient was currently refusing labs and CBG finger prick. ? ?MD ordered 1 mg Haldol which was given at 0744. ? ?At 0900 patient is resting in the bed.  ? ?Will continue to monitor.  ? ?Dewaine Oats ? ?

## 2021-09-05 NOTE — Assessment & Plan Note (Addendum)
During hospitalization, patient developed visual hallucinations.  Reports "seeing a face on the wall with red hair", "cup changing colors".  No previous history of psychiatric disorder.  Suspect this is related to her diagnosis of posterior reversible encephalopathy syndrome as this can cause confusion, hallucinations and even seizures.  With improvement of blood pressure, hallucinations have resolved. ?

## 2021-09-05 NOTE — Progress Notes (Signed)
?[facility] ?Progress Note  ? ?36 y.o. female hx of HTN, failed renal transplant x 2, anemia ckd/ other, ESRD on HD MWF presented to ED yesterday evening with headache, dizziness and vomiting.  She also complained of blurry vision which was the primary reason for presentation to the ED. In ED BP very high ~ 264/170 and K+ 5.1 IV Cardene was started and CT  head showed a 1.4cm peripheral rt occipital hypodensity. Patient noted to be somnolent but arousable and following commands. She admits to frequently missing doses of her antihypertensive medications.   ? ?Assessment/ Plan:   ?  ?OP HD: MWF AF ?3h 81min  400/500  Hep 3500  LFA AVF ?Went to past 3 treatments; left at 56kg on Fri (3/10) ?UFP 2 ?Hectorol 10 qtx ?Mircera 225 q2w given 3/1 ?  ?  ?SOB/ mild pulm edema - due to severe uncont HTN. Not vol overloaded on exam, not in distress. On IV cardene in ED. Will plan HD today when pt is in ICU bed.  ?Hyperkalemia - resolved ?Uncont HTN - missed home meds x 24 hrs, severe HTN in ED 230/ 160. Initially on IV cardene and BP's coming down now but still elevated. Very labile (as low as 135/91). She states she has the labetolol and nifedipine at home. ?ESRD - usual HD MWF.  ? ?Able to net UF 2L on 3/14 ? ?Seen on HD today ?4K bath 191/93 1L net UF as tolerated ? ?Next treatment on Fri if she's still here ? 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  ? ?Subjective:   ?Feeling better and wondering when she can go home.  ?Denies fever, chills, nausea or dyspnea.  ? ?Objective:   ?BP (!) 190/95   Pulse 92   Temp 98.2 ?F (36.8 ?C) (Temporal)   Resp 16   Ht 5' (1.524 m)   Wt 56.3 kg   SpO2 99%   BMI 24.24 kg/m?  ? ?Intake/Output Summary (Last 24 hours) at 09/05/2021 1407 ?Last data filed at 09/04/2021 2000 ?Gross per 24 hour  ?Intake 300 ml  ?Output --  ?Net 300 ml  ? ?Weight change: -0.8  kg ? ?Physical Exam: ?Gen alert, no distress ?Sclera anicteric, throat clear  ?No jvd or bruits ?Chest clear bilat to bases ?RRR no MRG ?Abd soft ntnd no mass or ascites +bs ?Ext no LE or UE edema, no wounds or ulcers ?Neuro is alert, Ox 3 , nf ?Access: lt Cimino good thrill ? ?Imaging: ?DG Chest Port 1 View ? ?Result Date: 09/04/2021 ?CLINICAL DATA:  Hypoxia. EXAM: PORTABLE CHEST 1 VIEW COMPARISON:  09/02/2021 FINDINGS: 1322 hours. The cardio pericardial silhouette is enlarged. There is pulmonary vascular congestion without overt pulmonary edema. Basilar atelectasis noted bilaterally. No substantial pleural effusion. The visualized bony structures of the thorax are unremarkable. IMPRESSION: Enlargement of the cardiopericardial silhouette with vascular congestion. Electronically Signed   By: Misty Stanley M.D.   On: 09/04/2021 13:39  ? ?ECHOCARDIOGRAM COMPLETE ? ?Result Date: 09/03/2021 ?   ECHOCARDIOGRAM REPORT   Patient Name:   NARA PATERNOSTER Rasmussen Date of Exam: 09/03/2021 Medical Rec #:  [id number]          Height:       60.0 in Accession #:    [id number]         Weight:  127.4 lb Date of Birth:  May 19, 1986           BSA:          1.541 m? Patient Age:    89 years           BP:           161/99 mmHg Patient Gender: F                  HR:           77 bpm. Exam Location:  Inpatient Procedure: 2D Echo, Cardiac Doppler and Color Doppler Indications:     Elevated Troponin  History:         Patient has prior history of Echocardiogram examinations, most                  recent 08/20/2021. Risk Factors:Hypertension.  Sonographer:     Bernadene Person RDCS Referring Phys:  Toney Sang SOOD Diagnosing Phys: Fransico Him MD IMPRESSIONS  1. Left ventricular ejection fraction, by estimation, is 60 to 65%. The left ventricle has normal function. The left ventricle has no regional wall motion abnormalities. There is severe concentric left ventricular hypertrophy. Left ventricular diastolic  parameters are consistent with Grade II  diastolic dysfunction (pseudonormalization). Elevated left ventricular end-diastolic pressure. The average left ventricular global longitudinal strain is -22.1 %. The global longitudinal strain is normal.  2. Right ventricular systolic function is normal. The right ventricular size is normal. There is mildly elevated pulmonary artery systolic pressure. The estimated right ventricular systolic pressure is 25.4 mmHg.  3. Left atrial size was severely dilated.  4. The mitral valve is normal in structure. Mild mitral valve regurgitation. No evidence of mitral stenosis.  5. The aortic valve is normal in structure. Aortic valve regurgitation is not visualized. No aortic stenosis is present.  6. Aortic dilatation noted. There is borderline dilatation of the ascending aorta, measuring 37 mm.  7. Tricuspid valve regurgitation is mild to moderate.  8. The inferior vena cava is normal in size with <50% respiratory variability, suggesting right atrial pressure of 8 mmHg.  9. A small pericardial effusion is present. The pericardial effusion is circumferential but larger posterior to the LA measuring 1.5cm. 10. Compared to study dated 08/20/2021, no significant change. Consider cMRI to rule out HOCM or other infiltrate disease given severe LVH and pericardial effusion. FINDINGS  Left Ventricle: Left ventricular ejection fraction, by estimation, is 60 to 65%. The left ventricle has normal function. The left ventricle has no regional wall motion abnormalities. The average left ventricular global longitudinal strain is -22.1 %. The global longitudinal strain is normal. The left ventricular internal cavity size was normal in size. There is severe concentric left ventricular hypertrophy. Left ventricular diastolic parameters are consistent with Grade II diastolic dysfunction (pseudonormalization). Elevated left ventricular end-diastolic pressure. Right Ventricle: The right ventricular size is normal. No increase in right ventricular  wall thickness. Right ventricular systolic function is normal. There is mildly elevated pulmonary artery systolic pressure. The tricuspid regurgitant velocity is 2.94  m/s, and with an assumed right atrial pressure of 8 mmHg, the estimated right ventricular systolic pressure is 27.0 mmHg. Left Atrium: Left atrial size was severely dilated. Right Atrium: Right atrial size was normal in size. Pericardium: A small pericardial effusion is present. The pericardial effusion is circumferential. Mitral Valve: The mitral valve is normal in structure. Mild mitral valve regurgitation. No evidence of mitral valve stenosis. Tricuspid Valve: The tricuspid valve is normal  in structure. Tricuspid valve regurgitation is mild to moderate. No evidence of tricuspid stenosis. Aortic Valve: The aortic valve is normal in structure. Aortic valve regurgitation is not visualized. No aortic stenosis is present. Pulmonic Valve: The pulmonic valve was normal in structure. Pulmonic valve regurgitation is mild to moderate. No evidence of pulmonic stenosis. Aorta: Aortic dilatation noted. There is borderline dilatation of the ascending aorta, measuring 37 mm. Venous: The inferior vena cava is normal in size with less than 50% respiratory variability, suggesting right atrial pressure of 8 mmHg. IAS/Shunts: No atrial level shunt detected by color flow Doppler.  LEFT VENTRICLE PLAX 2D LVIDd:         4.90 cm     Diastology LVIDs:         3.00 cm     LV e' medial:    2.81 cm/s LV PW:         1.70 cm     LV E/e' medial:  37.0 LV IVS:        1.50 cm     LV e' lateral:   3.49 cm/s LVOT diam:     2.10 cm     LV E/e' lateral: 29.8 LV SV:         83 LV SV Index:   54          2D Longitudinal Strain LVOT Area:     3.46 cm?    2D Strain GLS (A2C):   -22.3 %                            2D Strain GLS (A3C):   -19.5 %                            2D Strain GLS (A4C):   -24.6 % LV Volumes (MOD)           2D Strain GLS Avg:     -22.1 % LV vol d, MOD A2C: 85.5 ml LV  vol d, MOD A4C: 71.5 ml LV vol s, MOD A2C: 22.3 ml LV vol s, MOD A4C: 18.8 ml LV SV MOD A2C:     63.2 ml LV SV MOD A4C:     71.5 ml LV SV MOD BP:      59.1 ml RIGHT VENTRICLE RV S prime:     11.90 cm/s TAPSE (M-mode): 2.0 cm

## 2021-09-05 NOTE — Assessment & Plan Note (Signed)
TTE 09/03/2021 with severe concentric LVH and small pericardial effusion which is relatively unchanged from previous TTE 08/20/2021.  Outpatient follow-up with cardiology for consideration of cardiac MRI to rule out HOCM or other infiltrative disease process. ?

## 2021-09-05 NOTE — Progress Notes (Signed)
removed 1043mls net fluid.  no complaints. pt bp very high.  systolic greater than 618.  gave help bp meds mid treatment and gave prn hydralazine at end of treatment.  pre bp 167/99 post bp 196/120 pre weight 56.3kg post weight 55.0kg bed scales.  2 bandages to lua avf no bleeding dressing cdi ?

## 2021-09-06 DIAGNOSIS — I6783 Posterior reversible encephalopathy syndrome: Secondary | ICD-10-CM | POA: Diagnosis not present

## 2021-09-06 LAB — CBC
HCT: 31.2 % — ABNORMAL LOW (ref 36.0–46.0)
Hemoglobin: 10 g/dL — ABNORMAL LOW (ref 12.0–15.0)
MCH: 32.2 pg (ref 26.0–34.0)
MCHC: 32.1 g/dL (ref 30.0–36.0)
MCV: 100.3 fL — ABNORMAL HIGH (ref 80.0–100.0)
Platelets: 109 10*3/uL — ABNORMAL LOW (ref 150–400)
RBC: 3.11 MIL/uL — ABNORMAL LOW (ref 3.87–5.11)
RDW: 20.2 % — ABNORMAL HIGH (ref 11.5–15.5)
WBC: 5.5 10*3/uL (ref 4.0–10.5)
nRBC: 0 % (ref 0.0–0.2)

## 2021-09-06 LAB — RENAL FUNCTION PANEL
Albumin: 3.1 g/dL — ABNORMAL LOW (ref 3.5–5.0)
Anion gap: 11 (ref 5–15)
BUN: 18 mg/dL (ref 6–20)
CO2: 26 mmol/L (ref 22–32)
Calcium: 8.8 mg/dL — ABNORMAL LOW (ref 8.9–10.3)
Chloride: 96 mmol/L — ABNORMAL LOW (ref 98–111)
Creatinine, Ser: 5.67 mg/dL — ABNORMAL HIGH (ref 0.44–1.00)
GFR, Estimated: 9 mL/min — ABNORMAL LOW (ref >60)
Glucose, Bld: 98 mg/dL (ref 70–99)
Phosphorus: 3.1 mg/dL (ref 2.5–4.6)
Potassium: 3.9 mmol/L (ref 3.5–5.1)
Sodium: 133 mmol/L — ABNORMAL LOW (ref 135–145)

## 2021-09-06 LAB — GLUCOSE, CAPILLARY: Glucose-Capillary: 74 mg/dL (ref 70–99)

## 2021-09-06 MED ORDER — CARVEDILOL 25 MG PO TABS: 25.0000 mg | ORAL_TABLET | Freq: Every morning | ORAL | 2 refills | Status: AC

## 2021-09-06 MED ORDER — CLONIDINE HCL 0.3 MG/24HR TD PTWK: 0.3000 mg | MEDICATED_PATCH | TRANSDERMAL | Status: DC

## 2021-09-06 MED ORDER — LOSARTAN POTASSIUM 100 MG PO TABS: 100.0000 mg | ORAL_TABLET | Freq: Every day | ORAL | 2 refills | Status: AC

## 2021-09-06 MED ORDER — CLONIDINE 0.3 MG/24HR TD PTWK: 0.3000 mg | MEDICATED_PATCH | TRANSDERMAL | 2 refills | Status: AC

## 2021-09-06 MED ORDER — CLONIDINE HCL 0.3 MG/24HR TD PTWK
0.3000 mg | MEDICATED_PATCH | TRANSDERMAL | Status: DC
  Administered 2021-09-06: 0.3 mg via TRANSDERMAL
  Filled 2021-09-06: qty 1

## 2021-09-06 MED ORDER — NIFEDIPINE ER OSMOTIC RELEASE 90 MG PO TB24: 90.0000 mg | ORAL_TABLET | Freq: Every day | ORAL | 2 refills | Status: AC

## 2021-09-06 NOTE — TOC Transition Note (Signed)
Transition of Care (TOC) - CM/SW Discharge Note ? ? ?Patient Details  ?Name: Kerry Cook ?MRN: [id number] ?Date of Birth: 12/16/1985 ? ?Transition of Care (TOC) CM/SW Contact:  ?Tom-Johnson, Renea Ee, RN ?Phone Number: ?09/06/2021, 10:52 AM ? ? ?Clinical Narrative:    ? ?Patient is scheduled for discharge today. No TOC recommendations or needs noted. Patient to resume dialysis on Friday at [facility] SW and family to transport. No further TOC needs noted. ? ?  ?  ? ? ?Patient Goals and CMS Choice ?  ?  ?  ? ?Discharge Placement ?  ?           ?  ?  ?  ?  ? ?Discharge Plan and Services ?  ?  ?           ?  ?  ?  ?  ?  ?  ?  ?  ?  ?  ? ?Social Determinants of Health (SDOH) Interventions ?  ? ? ?Readmission Risk Interventions ?Readmission Risk Prevention Plan 05/24/2021  ?Post Dischage Appt Complete  ?Medication Screening Complete  ?Transportation Screening Complete  ?Some recent data might be hidden  ? ? ? ? ? ?

## 2021-09-06 NOTE — Progress Notes (Signed)
Reviewed AVS with pt.  All questions answered.  Pt took cell phone and bag of personal belongings with self upon discharge.   ?Irven Baltimore, RN ? ?

## 2021-09-06 NOTE — Progress Notes (Signed)
D/C order noted. Contacted [facility] SW to advise clinic of pt's d/c today and that pt will resume care on Friday.   Magan Winnett Renal Navigator [phone] 
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
## 2022-01-21 ENCOUNTER — Inpatient Hospital Stay (HOSPITAL_COMMUNITY)
Admission: EM | Admit: 2022-01-21 | Discharge: 2022-01-22 | DRG: 291 | Disposition: E | Payer: Medicare Other | Attending: Internal Medicine | Admitting: Internal Medicine

## 2022-01-21 ENCOUNTER — Emergency Department (HOSPITAL_COMMUNITY): Payer: Medicare Other

## 2022-01-21 ENCOUNTER — Inpatient Hospital Stay (HOSPITAL_COMMUNITY): Payer: Medicare Other

## 2022-01-21 ENCOUNTER — Other Ambulatory Visit: Payer: Self-pay

## 2022-01-21 ENCOUNTER — Encounter (HOSPITAL_COMMUNITY): Payer: Self-pay | Admitting: Emergency Medicine

## 2022-01-21 DIAGNOSIS — A419 Sepsis, unspecified organism: Secondary | ICD-10-CM

## 2022-01-21 DIAGNOSIS — E875 Hyperkalemia: Secondary | ICD-10-CM | POA: Diagnosis present

## 2022-01-21 DIAGNOSIS — Z79899 Other long term (current) drug therapy: Secondary | ICD-10-CM | POA: Diagnosis not present

## 2022-01-21 DIAGNOSIS — Z841 Family history of disorders of kidney and ureter: Secondary | ICD-10-CM | POA: Diagnosis not present

## 2022-01-21 DIAGNOSIS — I5033 Acute on chronic diastolic (congestive) heart failure: Secondary | ICD-10-CM | POA: Diagnosis present

## 2022-01-21 DIAGNOSIS — E162 Hypoglycemia, unspecified: Secondary | ICD-10-CM | POA: Diagnosis present

## 2022-01-21 DIAGNOSIS — J189 Pneumonia, unspecified organism: Secondary | ICD-10-CM | POA: Diagnosis present

## 2022-01-21 DIAGNOSIS — I509 Heart failure, unspecified: Secondary | ICD-10-CM | POA: Diagnosis present

## 2022-01-21 DIAGNOSIS — G9349 Other encephalopathy: Secondary | ICD-10-CM | POA: Diagnosis not present

## 2022-01-21 DIAGNOSIS — J9601 Acute respiratory failure with hypoxia: Secondary | ICD-10-CM

## 2022-01-21 DIAGNOSIS — N2581 Secondary hyperparathyroidism of renal origin: Secondary | ICD-10-CM | POA: Diagnosis present

## 2022-01-21 DIAGNOSIS — Z515 Encounter for palliative care: Secondary | ICD-10-CM

## 2022-01-21 DIAGNOSIS — I959 Hypotension, unspecified: Secondary | ICD-10-CM | POA: Diagnosis not present

## 2022-01-21 DIAGNOSIS — D631 Anemia in chronic kidney disease: Secondary | ICD-10-CM | POA: Diagnosis present

## 2022-01-21 DIAGNOSIS — I953 Hypotension of hemodialysis: Secondary | ICD-10-CM | POA: Diagnosis present

## 2022-01-21 DIAGNOSIS — K279 Peptic ulcer, site unspecified, unspecified as acute or chronic, without hemorrhage or perforation: Secondary | ICD-10-CM | POA: Diagnosis present

## 2022-01-21 DIAGNOSIS — I132 Hypertensive heart and chronic kidney disease with heart failure and with stage 5 chronic kidney disease, or end stage renal disease: Secondary | ICD-10-CM | POA: Diagnosis present

## 2022-01-21 DIAGNOSIS — E785 Hyperlipidemia, unspecified: Secondary | ICD-10-CM | POA: Diagnosis present

## 2022-01-21 DIAGNOSIS — I3139 Other pericardial effusion (noninflammatory): Secondary | ICD-10-CM | POA: Diagnosis present

## 2022-01-21 DIAGNOSIS — Z880 Allergy status to penicillin: Secondary | ICD-10-CM | POA: Diagnosis not present

## 2022-01-21 DIAGNOSIS — J9602 Acute respiratory failure with hypercapnia: Secondary | ICD-10-CM | POA: Diagnosis present

## 2022-01-21 DIAGNOSIS — E872 Acidosis, unspecified: Secondary | ICD-10-CM | POA: Diagnosis present

## 2022-01-21 DIAGNOSIS — I469 Cardiac arrest, cause unspecified: Secondary | ICD-10-CM

## 2022-01-21 DIAGNOSIS — T8612 Kidney transplant failure: Secondary | ICD-10-CM | POA: Diagnosis present

## 2022-01-21 DIAGNOSIS — Z809 Family history of malignant neoplasm, unspecified: Secondary | ICD-10-CM

## 2022-01-21 DIAGNOSIS — I468 Cardiac arrest due to other underlying condition: Secondary | ICD-10-CM | POA: Diagnosis not present

## 2022-01-21 DIAGNOSIS — N186 End stage renal disease: Secondary | ICD-10-CM | POA: Diagnosis present

## 2022-01-21 DIAGNOSIS — G9340 Encephalopathy, unspecified: Secondary | ICD-10-CM | POA: Diagnosis present

## 2022-01-21 DIAGNOSIS — Y830 Surgical operation with transplant of whole organ as the cause of abnormal reaction of the patient, or of later complication, without mention of misadventure at the time of the procedure: Secondary | ICD-10-CM | POA: Diagnosis present

## 2022-01-21 DIAGNOSIS — R6521 Severe sepsis with septic shock: Secondary | ICD-10-CM

## 2022-01-21 DIAGNOSIS — Z66 Do not resuscitate: Secondary | ICD-10-CM | POA: Diagnosis not present

## 2022-01-21 DIAGNOSIS — Z992 Dependence on renal dialysis: Secondary | ICD-10-CM

## 2022-01-21 DIAGNOSIS — I10 Essential (primary) hypertension: Secondary | ICD-10-CM | POA: Diagnosis not present

## 2022-01-21 LAB — CBC
HCT: 35.1 % — ABNORMAL LOW (ref 36.0–46.0)
Hemoglobin: 10.9 g/dL — ABNORMAL LOW (ref 12.0–15.0)
MCH: 32.4 pg (ref 26.0–34.0)
MCHC: 31.1 g/dL (ref 30.0–36.0)
MCV: 104.5 fL — ABNORMAL HIGH (ref 80.0–100.0)
Platelets: DECREASED 10*3/uL (ref 150–400)
RBC: 3.36 MIL/uL — ABNORMAL LOW (ref 3.87–5.11)
RDW: 19.2 % — ABNORMAL HIGH (ref 11.5–15.5)
WBC: 7.9 10*3/uL (ref 4.0–10.5)
nRBC: 0.4 % — ABNORMAL HIGH (ref 0.0–0.2)

## 2022-01-21 LAB — CBG MONITORING, ED
Glucose-Capillary: 50 mg/dL — ABNORMAL LOW (ref 70–99)
Glucose-Capillary: 109 mg/dL — ABNORMAL HIGH (ref 70–99)
Glucose-Capillary: 99 mg/dL (ref 70–99)

## 2022-01-21 LAB — CBC WITH DIFFERENTIAL/PLATELET
Abs Immature Granulocytes: 0.01 10*3/uL (ref 0.00–0.07)
Basophils Absolute: 0 10*3/uL (ref 0.0–0.1)
Basophils Relative: 1 %
Eosinophils Absolute: 0.1 10*3/uL (ref 0.0–0.5)
Eosinophils Relative: 3 %
HCT: 33.6 % — ABNORMAL LOW (ref 36.0–46.0)
Hemoglobin: 10.6 g/dL — ABNORMAL LOW (ref 12.0–15.0)
Immature Granulocytes: 0 %
Lymphocytes Relative: 17 %
Lymphs Abs: 0.9 10*3/uL (ref 0.7–4.0)
MCH: 32.9 pg (ref 26.0–34.0)
MCHC: 31.5 g/dL (ref 30.0–36.0)
MCV: 104.3 fL — ABNORMAL HIGH (ref 80.0–100.0)
Monocytes Absolute: 0.3 10*3/uL (ref 0.1–1.0)
Monocytes Relative: 6 %
Neutro Abs: 3.8 10*3/uL (ref 1.7–7.7)
Neutrophils Relative %: 73 %
Platelets: 96 10*3/uL — ABNORMAL LOW (ref 150–400)
RBC: 3.22 MIL/uL — ABNORMAL LOW (ref 3.87–5.11)
RDW: 19.4 % — ABNORMAL HIGH (ref 11.5–15.5)
WBC: 5.2 10*3/uL (ref 4.0–10.5)
nRBC: 0 % (ref 0.0–0.2)

## 2022-01-21 LAB — ECHOCARDIOGRAM LIMITED
Calc EF: 45.2 %
Single Plane A2C EF: 44.5 %
Single Plane A4C EF: 45.4 %

## 2022-01-21 LAB — COMPREHENSIVE METABOLIC PANEL
ALT: 52 U/L — ABNORMAL HIGH (ref 0–44)
ALT: 67 U/L — ABNORMAL HIGH (ref 0–44)
AST: 54 U/L — ABNORMAL HIGH (ref 15–41)
AST: 97 U/L — ABNORMAL HIGH (ref 15–41)
Albumin: 2.6 g/dL — ABNORMAL LOW (ref 3.5–5.0)
Albumin: 2.7 g/dL — ABNORMAL LOW (ref 3.5–5.0)
Alkaline Phosphatase: 119 U/L (ref 38–126)
Alkaline Phosphatase: 142 U/L — ABNORMAL HIGH (ref 38–126)
Anion gap: 13 (ref 5–15)
Anion gap: 16 — ABNORMAL HIGH (ref 5–15)
BUN: 26 mg/dL — ABNORMAL HIGH (ref 6–20)
BUN: 32 mg/dL — ABNORMAL HIGH (ref 6–20)
CO2: 23 mmol/L (ref 22–32)
CO2: 22 mmol/L (ref 22–32)
Calcium: 9.4 mg/dL (ref 8.9–10.3)
Calcium: 9.7 mg/dL (ref 8.9–10.3)
Chloride: 100 mmol/L (ref 98–111)
Chloride: 98 mmol/L (ref 98–111)
Creatinine, Ser: 5.39 mg/dL — ABNORMAL HIGH (ref 0.44–1.00)
Creatinine, Ser: 6.12 mg/dL — ABNORMAL HIGH (ref 0.44–1.00)
GFR, Estimated: 10 mL/min — ABNORMAL LOW (ref >60)
GFR, Estimated: 9 mL/min — ABNORMAL LOW (ref >60)
Glucose, Bld: 123 mg/dL — ABNORMAL HIGH (ref 70–99)
Glucose, Bld: 246 mg/dL — ABNORMAL HIGH (ref 70–99)
Potassium: 5.1 mmol/L (ref 3.5–5.1)
Potassium: 6.6 mmol/L (ref 3.5–5.1)
Sodium: 136 mmol/L (ref 135–145)
Sodium: 136 mmol/L (ref 135–145)
Total Bilirubin: 1.3 mg/dL — ABNORMAL HIGH (ref 0.3–1.2)
Total Bilirubin: 2.6 mg/dL — ABNORMAL HIGH (ref 0.3–1.2)
Total Protein: 6.3 g/dL — ABNORMAL LOW (ref 6.5–8.1)
Total Protein: 6.7 g/dL (ref 6.5–8.1)

## 2022-01-21 LAB — LACTIC ACID, PLASMA
Lactic Acid, Venous: 6 mmol/L (ref 0.5–1.9)
Lactic Acid, Venous: >9 mmol/L (ref 0.5–1.9)

## 2022-01-21 LAB — POCT I-STAT 7, (LYTES, BLD GAS, ICA,H+H)
Acid-base deficit: 7 mmol/L — ABNORMAL HIGH (ref 0.0–2.0)
Bicarbonate: 22.2 mmol/L (ref 20.0–28.0)
Calcium, Ion: 1.23 mmol/L (ref 1.15–1.40)
HCT: 36 % (ref 36.0–46.0)
Hemoglobin: 12.2 g/dL (ref 12.0–15.0)
O2 Saturation: 91 %
Potassium: 6.4 mmol/L (ref 3.5–5.1)
Sodium: 133 mmol/L — ABNORMAL LOW (ref 135–145)
TCO2: 24 mmol/L (ref 22–32)
pCO2 arterial: 59.6 mmHg — ABNORMAL HIGH (ref 32–48)
pH, Arterial: 7.179 — CL (ref 7.35–7.45)
pO2, Arterial: 79 mmHg — ABNORMAL LOW (ref 83–108)

## 2022-01-21 LAB — I-STAT CHEM 8, ED
BUN: 33 mg/dL — ABNORMAL HIGH (ref 6–20)
Calcium, Ion: 1.1 mmol/L — ABNORMAL LOW (ref 1.15–1.40)
Chloride: 101 mmol/L (ref 98–111)
Creatinine, Ser: 6 mg/dL — ABNORMAL HIGH (ref 0.44–1.00)
Glucose, Bld: 123 mg/dL — ABNORMAL HIGH (ref 70–99)
HCT: 33 % — ABNORMAL LOW (ref 36.0–46.0)
Hemoglobin: 11.2 g/dL — ABNORMAL LOW (ref 12.0–15.0)
Potassium: 5.3 mmol/L — ABNORMAL HIGH (ref 3.5–5.1)
Sodium: 135 mmol/L (ref 135–145)
TCO2: 27 mmol/L (ref 22–32)

## 2022-01-21 LAB — TROPONIN I (HIGH SENSITIVITY)
Troponin I (High Sensitivity): 127 ng/L (ref <18)
Troponin I (High Sensitivity): 167 ng/L (ref <18)
Troponin I (High Sensitivity): 338 ng/L (ref <18)

## 2022-01-21 LAB — TSH: TSH: 12.443 u[IU]/mL — ABNORMAL HIGH (ref 0.350–4.500)

## 2022-01-21 LAB — BRAIN NATRIURETIC PEPTIDE: B Natriuretic Peptide: 3687.9 pg/mL — ABNORMAL HIGH (ref 0.0–100.0)

## 2022-01-21 LAB — GLUCOSE, CAPILLARY: Glucose-Capillary: 173 mg/dL — ABNORMAL HIGH (ref 70–99)

## 2022-01-21 LAB — MRSA NEXT GEN BY PCR, NASAL: MRSA by PCR Next Gen: NOT DETECTED

## 2022-01-21 LAB — CORTISOL-PM, BLOOD: Cortisol - PM: 18.4 ug/dL — ABNORMAL HIGH (ref <10.0)

## 2022-01-21 LAB — HIV ANTIBODY (ROUTINE TESTING W REFLEX): HIV Screen 4th Generation wRfx: NONREACTIVE

## 2022-01-21 MED ORDER — NOREPINEPHRINE 16 MG/250ML-% IV SOLN
0.0000 ug/min | INTRAVENOUS | Status: DC
  Administered 2022-01-21: 80 ug/min via INTRAVENOUS
  Filled 2022-01-21: qty 250

## 2022-01-21 MED ORDER — NOREPINEPHRINE 4 MG/250ML-% IV SOLN: 0.0000 ug/min | INTRAVENOUS | Status: DC

## 2022-01-21 MED ORDER — PANTOPRAZOLE SODIUM 20 MG PO TBEC: 20.0000 mg | DELAYED_RELEASE_TABLET | Freq: Every day | ORAL | Status: DC

## 2022-01-21 MED ORDER — EPINEPHRINE 1 MG/10ML IJ SOSY
PREFILLED_SYRINGE | INTRAMUSCULAR | Status: AC | PRN
  Administered 2022-01-21: 1 mg via INTRAVENOUS

## 2022-01-21 MED ORDER — MIRTAZAPINE 15 MG PO TABS: 15.0000 mg | ORAL_TABLET | Freq: Every day | ORAL | Status: DC

## 2022-01-21 MED ORDER — SODIUM BICARBONATE 8.4 % IV SOLN
INTRAVENOUS | Status: AC | PRN
  Administered 2022-01-21: 50 meq via INTRAVENOUS

## 2022-01-21 MED ORDER — PHENYLEPHRINE 80 MCG/ML (10ML) SYRINGE FOR IV PUSH (FOR BLOOD PRESSURE SUPPORT)
PREFILLED_SYRINGE | INTRAVENOUS | Status: AC
  Filled 2022-01-21: qty 10

## 2022-01-21 MED ORDER — CEFTRIAXONE SODIUM 2 G IJ SOLR: 2.0000 g | INTRAMUSCULAR | Status: DC

## 2022-01-21 MED ORDER — ONDANSETRON HCL 4 MG/2ML IJ SOLN: 4.0000 mg | Freq: Four times a day (QID) | INTRAMUSCULAR | Status: DC | PRN

## 2022-01-21 MED ORDER — POLYETHYLENE GLYCOL 3350 17 G PO PACK: 17.0000 g | PACK | Freq: Every day | ORAL | Status: DC

## 2022-01-21 MED ORDER — VASOPRESSIN 20 UNITS/100 ML INFUSION FOR SHOCK: 0.0000 [IU]/min | INTRAVENOUS | Status: DC

## 2022-01-21 MED ORDER — HEPARIN SODIUM (PORCINE) 5000 UNIT/ML IJ SOLN
5000.0000 [IU] | Freq: Three times a day (TID) | INTRAMUSCULAR | Status: DC
Start: 2022-01-21 — End: 2022-01-22

## 2022-01-21 MED ORDER — FENTANYL CITRATE PF 50 MCG/ML IJ SOSY: 50.0000 ug | PREFILLED_SYRINGE | INTRAMUSCULAR | Status: DC | PRN

## 2022-01-21 MED ORDER — ETOMIDATE 2 MG/ML IV SOLN: 20.0000 mg | Freq: Once | INTRAVENOUS | Status: AC

## 2022-01-21 MED ORDER — EPINEPHRINE HCL 5 MG/250ML IV SOLN IN NS
0.5000 ug/min | INTRAVENOUS | Status: DC
  Administered 2022-01-21: 5 ug/min via INTRAVENOUS

## 2022-01-21 MED ORDER — EPINEPHRINE 1 MG/10ML IJ SOSY
1.0000 mg | PREFILLED_SYRINGE | Freq: Once | INTRAMUSCULAR | Status: AC
  Administered 2022-01-21: 1 mg via INTRAVENOUS

## 2022-01-21 MED ORDER — HYDRALAZINE HCL 50 MG PO TABS: 25.0000 mg | ORAL_TABLET | Freq: Four times a day (QID) | ORAL | Status: DC | PRN

## 2022-01-21 MED ORDER — POLYETHYLENE GLYCOL 3350 17 G PO PACK: 17.0000 g | PACK | Freq: Every day | ORAL | Status: DC | PRN

## 2022-01-21 MED ORDER — EPINEPHRINE 1 MG/10ML IJ SOSY
0.5000 mg | PREFILLED_SYRINGE | Freq: Once | INTRAMUSCULAR | Status: AC
  Administered 2022-01-21: 0.5 mg via INTRAVENOUS

## 2022-01-21 MED ORDER — ACETAMINOPHEN 325 MG PO TABS: 650.0000 mg | ORAL_TABLET | ORAL | Status: DC | PRN

## 2022-01-21 MED ORDER — NOREPINEPHRINE 4 MG/250ML-% IV SOLN
INTRAVENOUS | Status: AC
  Administered 2022-01-21: 10 ug/min via INTRAVENOUS
  Filled 2022-01-21: qty 250

## 2022-01-21 MED ORDER — DEXTROSE 50 % IV SOLN
50.0000 mL | Freq: Once | INTRAVENOUS | Status: AC
  Administered 2022-01-21: 50 mL via INTRAVENOUS

## 2022-01-21 MED ORDER — DEXTROSE 50 % IV SOLN
INTRAVENOUS | Status: AC | PRN
  Administered 2022-01-21: 1 via INTRAVENOUS

## 2022-01-21 MED ORDER — EPINEPHRINE HCL 5 MG/250ML IV SOLN IN NS
INTRAVENOUS | Status: AC
  Filled 2022-01-21: qty 250

## 2022-01-21 MED ORDER — CALCIUM CHLORIDE 10 % IV SOLN
INTRAVENOUS | Status: AC | PRN
  Administered 2022-01-21: 1 g via INTRAVENOUS

## 2022-01-21 MED ORDER — SODIUM BICARBONATE 8.4 % IV SOLN
50.0000 meq | Freq: Once | INTRAVENOUS | Status: AC
  Administered 2022-01-21: 50 meq via INTRAVENOUS

## 2022-01-21 MED ORDER — EPINEPHRINE HCL 5 MG/250ML IV SOLN IN NS: 0.5000 ug/min | INTRAVENOUS | Status: DC

## 2022-01-21 MED ORDER — EPINEPHRINE 1 MG/10ML IJ SOSY
PREFILLED_SYRINGE | INTRAMUSCULAR | Status: AC | PRN
  Administered 2022-01-21 (×2): 1 mg via INTRAVENOUS

## 2022-01-21 MED ORDER — HEPARIN SODIUM (PORCINE) 5000 UNIT/ML IJ SOLN
5000.0000 [IU] | Freq: Two times a day (BID) | INTRAMUSCULAR | Status: DC
Start: 2022-01-21 — End: 2022-01-21

## 2022-01-21 MED ORDER — METRONIDAZOLE 500 MG/100ML IV SOLN: 500.0000 mg | Freq: Two times a day (BID) | INTRAVENOUS | Status: DC

## 2022-01-21 MED ORDER — FAMOTIDINE IN NACL 20-0.9 MG/50ML-% IV SOLN
20.0000 mg | Freq: Two times a day (BID) | INTRAVENOUS | Status: DC
  Filled 2022-01-21: qty 50

## 2022-01-21 MED ORDER — SODIUM CHLORIDE 0.9% FLUSH: 3.0000 mL | INTRAVENOUS | Status: DC | PRN

## 2022-01-21 MED ORDER — ATROPINE SULFATE 1 MG/ML IV SOLN
INTRAVENOUS | Status: AC | PRN
  Administered 2022-01-21: 1 mg via INTRAVENOUS

## 2022-01-21 MED ORDER — ETOMIDATE 2 MG/ML IV SOLN
INTRAVENOUS | Status: AC
  Administered 2022-01-21: 20 mg via INTRAVENOUS
  Filled 2022-01-21: qty 10

## 2022-01-21 MED ORDER — FAMOTIDINE IN NACL 20-0.9 MG/50ML-% IV SOLN
20.0000 mg | Freq: Every day | INTRAVENOUS | Status: DC
  Filled 2022-01-21: qty 50

## 2022-01-21 MED ORDER — HEPARIN SODIUM (PORCINE) 5000 UNIT/ML IJ SOLN: 5000.0000 [IU] | Freq: Two times a day (BID) | INTRAMUSCULAR | Status: DC

## 2022-01-21 MED ORDER — PROPOFOL 1000 MG/100ML IV EMUL: 0.0000 ug/kg/min | INTRAVENOUS | Status: DC

## 2022-01-21 MED ORDER — ROCURONIUM BROMIDE 10 MG/ML (PF) SYRINGE
100.0000 mg | PREFILLED_SYRINGE | Freq: Once | INTRAVENOUS | Status: AC
  Filled 2022-01-21: qty 10

## 2022-01-21 MED ORDER — DOCUSATE SODIUM 50 MG/5ML PO LIQD: 100.0000 mg | Freq: Two times a day (BID) | ORAL | Status: DC

## 2022-01-21 MED ORDER — FERRIC CITRATE 1 GM 210 MG(FE) PO TABS: 420.0000 mg | ORAL_TABLET | Freq: Three times a day (TID) | ORAL | Status: DC

## 2022-01-21 MED ORDER — DOCUSATE SODIUM 100 MG PO CAPS: 100.0000 mg | ORAL_CAPSULE | Freq: Two times a day (BID) | ORAL | Status: DC | PRN

## 2022-01-21 MED ORDER — SODIUM CHLORIDE 0.9 % IV SOLN: 250.0000 mL | INTRAVENOUS | Status: DC | PRN

## 2022-01-21 MED ORDER — SODIUM CHLORIDE 0.9% FLUSH: 3.0000 mL | Freq: Two times a day (BID) | INTRAVENOUS | Status: DC

## 2022-01-21 MED ORDER — ATROPINE SULFATE 1 MG/10ML IJ SOSY
1.0000 mg | PREFILLED_SYRINGE | Freq: Once | INTRAMUSCULAR | Status: AC
  Administered 2022-01-21: 1 mg via INTRAVENOUS

## 2022-01-21 MED ORDER — ROCURONIUM BROMIDE 10 MG/ML (PF) SYRINGE
PREFILLED_SYRINGE | INTRAVENOUS | Status: AC
  Administered 2022-01-21: 100 mg via INTRAVENOUS
  Filled 2022-01-21: qty 10

## 2022-01-22 NOTE — H&P (Addendum)
NAME:  Kerry Cook, MRN:  [id number], DOB:  Mar 14, 1986, LOS: 0 ADMISSION DATE:  2022-02-06, CONSULTATION DATE:  02/06/2022 REFERRING MD:  [place] - [facility] , CHIEF COMPLAINT:  AMS   History of Present Illness:  36 yo F PMH ESRD, failed kidney transplant x2, HFpEF, HTN, Pericardial effusion presented to ED 7/31 from dialysis after being noted to be hypotensive at HD. Did not complete HD run.   Initially admitted to [facility] -- c/o lightheadedness and sleepiness.  While still in ED, pt became acutely less responsive. PCCM was called emergently for possible advanced airway   Patient became unresponsive and agonal after our arrival.  Required ACLS for PEA arrest x 2, Advanced airway  During first code, had significant gastric emesis & aspiration   Pertinent  Medical History  ESRD Failed renal transplant HTN HLD  HFpEF PUD   Significant Hospital Events: Including procedures, antibiotic start and stop dates in addition to other pertinent events   7/31 to ED from HD for eval of hypotension. Became unresponsive, agonal in ED. PCCM called emergently. PEA arrest x   Interim History / Subjective:  PEA arrest x 2  Objective   Blood pressure 99/80, pulse 79, temperature 98 F (36.7 C), temperature source Oral, resp. rate (!) 0, height 5' (1.524 m), weight 60 kg, SpO2 (!) 81 %.    Vent Mode: PRVC FiO2 (%):  [100 %] 100 % Set Rate:  [28 bmp] 28 bmp Vt Set:  [360 mL] 360 mL PEEP:  [10 cmH20] 10 cmH20 Plateau Pressure:  [30 cmH20] 30 cmH20  No intake or output data in the 24 hours ending 02-06-22 1751 Filed Weights   02-06-2022 1654  Weight: 60 kg    Examination: General: Critically and chronically ill adult F intubated  HENT: ETT secure. OGT secure Anicteric sclera  Lungs: Rhonchi bilaterally.  Cardiovascular: reg rate. Thready pulse. Cap refill 3 sec  Abdomen: Distended, tympanic. Multiple old abdominal scars Extremities: LUE fistula + thrill. RLE I/O. L>R BLE pitting edema Neuro:  Unresponsive  GU: defer  Resolved Hospital Problem list     Assessment & Plan:   Acute encephalopathy -unclear what the catalyst was. Was not hypotensive or profoundly hypoxic. Glu was 50, so possible that this was impactful. -prior to PEA arrest did have extensor movements of upper extremity, ? Sz.  P -CT H  -eeg -supportive care, RASs goal 0 -- not currently on sedation   PEA arrest x2 with ROSC -suspect mediated by hypoxemia, aspiration below. ? PE with preceding lightheadedness and outpt hypotension. We dont have a hx to support this or argue against, but should consider. P -supportive care -send CMP CBC coags LA  -if codes again (and CT H without pathology) consider tpa or tnkase if PE is suspected)  -ECHO pending which should give a better idea RE R heart fxn (prior RV has been normal)   Acute respiratory failure with hypoxemia due to aspiration of gastric contents -as above, consider PE as well  -BNP > 3000 -- ?acute HF exacerbation, pulm edema?, though looks is 2000-3000s on last several checks P -ETT adjusted based on post intubation CXR -ct chest non (grossly unstable)  -ABG -adjust vent as able though unfortunately not much room -Unasyn   Shock - undifferentiated  -has a known small pericardial effusion, hx HFpEF -on escalating pressor doses and has received neo pushes, epi pushes  -? Cardiogenic  -? PE -- was lightheaded and hypotensive at HD  P -CT c/a/p  -  Follow up ECHO read -NE, vaso for goal MAP >  65 via I/O -will need CVC and art line if stabilizes enough to undergo -- if CVC placed, send coox -trop, EKG, BNP  Hx HFpEF -- suspect acute HF exacerbation  -BNP > 3000  P -recheck + follow ECHO   Lactic acidosis  -in setting of above prcesses -repeat LA   Abdominal distention -c/f ? Perf P -OGT to decompress stomach -follow CT a/p   ESRD P -nephro following  -if stabilizes enough to tolerate, would need CRRT & temp HD cath    Hypoglycemia -corrected in ED   Best Practice (right click and "Reselect all SmartList Selections" daily)   Diet/type: NPO DVT prophylaxis: prophylactic heparin  GI prophylaxis: PPI Lines: N/A Foley:  N/A Code Status:  full code Last date of multidisciplinary goals of care discussion [--]  Labs   CBC: Recent Labs  Lab 02/07/2022 1250 02/07/22 1302  WBC 5.2  --   NEUTROABS 3.8  --   HGB 10.6* 11.2*  HCT 33.6* 33.0*  MCV 104.3*  --   PLT 96*  --     Basic Metabolic Panel: Recent Labs  Lab 02-07-22 1250 February 07, 2022 1302  NA 136 135  K 5.1 5.3*  CL 100 101  CO2 23  --   GLUCOSE 123* 123*  BUN 26* 33*  CREATININE 5.39* 6.00*  CALCIUM 9.4  --    GFR: Estimated Creatinine Clearance: 10.6 mL/min (A) (by C-G formula based on SCr of 6 mg/dL (H)). Recent Labs  Lab 02/07/2022 1250 07-Feb-2022 1544  WBC 5.2  --   LATICACIDVEN  --  6.0*    Liver Function Tests: Recent Labs  Lab Feb 07, 2022 1250  AST 54*  ALT 52*  ALKPHOS 119  BILITOT 1.3*  PROT 6.3*  ALBUMIN 2.6*   No results for input(s): "LIPASE", "AMYLASE" in the last 168 hours. No results for input(s): "AMMONIA" in the last 168 hours.  ABG    Component Value Date/Time   PHART 7.426 05/20/2021 1949   PCO2ART 26.7 (L) 05/20/2021 1949   PO2ART 119 (H) 05/20/2021 1949   HCO3 17.6 (L) 05/20/2021 1949   TCO2 27 February 07, 2022 1302   ACIDBASEDEF 6.0 (H) 05/20/2021 1949   O2SAT 99.0 05/20/2021 1949     Coagulation Profile: No results for input(s): "INR", "PROTIME" in the last 168 hours.  Cardiac Enzymes: No results for input(s): "CKTOTAL", "CKMB", "CKMBINDEX", "TROPONINI" in the last 168 hours.  HbA1C: Hgb A1c MFr Bld  Date/Time Value Ref Range Status  12/11/2020 01:55 PM 4.8 4.8 - 5.6 % Final    Comment:             Prediabetes: 5.7 - 6.4          Diabetes: >6.4          Glycemic control for adults with diabetes: <7.0     CBG: Recent Labs  Lab 02-07-2022 1623 Feb 07, 2022 1652 Feb 07, 2022 1723  GLUCAP 50*  109* 99    Review of Systems:   Unable to obtain Intubated  Post arrest   Past Medical History:  She,  has a past medical history of ESRD on hemodialysis ([place]), History of kidney transplant, Hypertension, Multiple gastric ulcers, and Pneumonia (01/23/2015).   Surgical History:   Past Surgical History:  Procedure Laterality Date   ESOPHAGOGASTRODUODENOSCOPY N/A 04/01/2016   Procedure: ESOPHAGOGASTRODUODENOSCOPY (EGD);  Surgeon: Gatha Mayer, MD;  Location: [facility] ENDOSCOPY;  Service: Endoscopy;  Laterality: N/A;   ESOPHAGOGASTRODUODENOSCOPY (EGD) WITH  PROPOFOL N/A 11/20/2016   Procedure: ESOPHAGOGASTRODUODENOSCOPY (EGD) WITH PROPOFOL;  Surgeon: Doran Stabler, MD;  Location: [facility];  Service: Endoscopy;  Laterality: N/A;   KIDNEY TRANSPLANT Bilateral 2012     Social History:   reports that she has never smoked. She has never used smokeless tobacco. She reports that she does not drink alcohol and does not use drugs.   Family History:  Her family history includes Cancer in her maternal grandfather; Kidney disease in her father; Lupus in her maternal grandmother. There is no history of Colon cancer, Stomach cancer, Rectal cancer, Esophageal cancer, or Liver cancer.   Allergies Allergies  Allergen Reactions   Tape Itching and Other (See Comments)    Plastic/clear tape tears up the patient's skin   Zosyn [Piperacillin Sod-Tazobactam So] Itching    Has patient had a PCN reaction causing immediate rash, facial/tongue/throat swelling, SOB or lightheadedness with hypotension: No Has patient had a PCN reaction causing severe rash involving mucus membranes or skin necrosis: No Has patient had a PCN reaction that required hospitalization: No Has patient had a PCN reaction occurring within the last 10 years: No If all of the above answers are "NO", then may proceed with Cephalosporin use.      Home Medications  Prior to Admission medications   Medication Sig Start Date End Date  Taking? Authorizing Provider  carvedilol (COREG) 25 MG tablet Take 1 tablet (25 mg total) by mouth every morning. 09/06/21 03/02/22  [place] Donnamarie Poag, DO  cloNIDine (CATAPRES - DOSED IN MG/24 HR) 0.3 mg/24hr patch Place 0.3 mg onto the skin once a week. 01/07/22   [provider]  ferric citrate (AURYXIA) 1 GM 210 MG(Fe) tablet Take 420 mg by mouth 3 (three) times daily with meals.    [provider]  losartan (COZAAR) 100 MG tablet Take 1 tablet (100 mg total) by mouth daily. 09/06/21 12/05/21  [place] Donnamarie Poag, DO  mirtazapine (REMERON) 15 MG tablet Take 15 mg by mouth at bedtime. 11/01/20 11/01/21  [provider]  NIFEdipine (PROCARDIA XL/NIFEDICAL-XL) 90 MG 24 hr tablet Take 1 tablet (90 mg total) by mouth daily. 09/06/21 12/05/21  [place] Donnamarie Poag, DO  pantoprazole (PROTONIX) 20 MG tablet Take 20 mg by mouth daily. 01/12/22   [provider]  amLODipine (NORVASC) 10 MG tablet Take 1 tablet (10 mg total) by mouth every evening. Patient not taking: No sig reported 09/13/15 12/14/20  [name], MD     Critical care time: 80 minutes      CRITICAL CARE Performed by: Cristal Generous   Total critical care time: 80 minutes  Critical care time was exclusive of separately billable procedures and treating other patients.  Critical care was necessary to treat or prevent imminent or life-threatening deterioration.  Critical care was time spent personally by me on the following activities: development of treatment plan with patient and/or surrogate as well as nursing, discussions with consultants, evaluation of patient's response to treatment, examination of patient, obtaining history from patient or surrogate, ordering and performing treatments and interventions, ordering and review of laboratory studies, ordering and review of radiographic studies, pulse oximetry and re-evaluation of patient's condition.  Eliseo Gum MSN, AGACNP-BC [place] for pager  Jan 25, 2022, 6:13 PM

## 2022-01-22 NOTE — ED Provider Notes (Signed)
[facility] EMERGENCY DEPARTMENT Provider Note   CSN: [id number] Arrival date & time: 02/13/22  1210     History  Chief Complaint  Patient presents with   Hypotension   Dizziness    Kerry Cook is a 36 y.o. female presents to ED with generalized weakness, hypotension - did not complete dialysis today.  Typically has HTN - admitted for PRES in March 2023.  On clonidine, coreg, and losartan and nifedipine at home.  Pt reporting feeling lightheaded, "blood pressure feels too low."  Reporting SOB.    HPI     Home Medications Prior to Admission medications   Medication Sig Start Date End Date Taking? Authorizing Provider  carvedilol (COREG) 25 MG tablet Take 1 tablet (25 mg total) by mouth every morning. 09/06/21 12/05/21  [place] Donnamarie Poag, DO  ferric citrate (AURYXIA) 1 GM 210 MG(Fe) tablet Take 420 mg by mouth 3 (three) times daily with meals.    [provider]  losartan (COZAAR) 100 MG tablet Take 1 tablet (100 mg total) by mouth daily. 09/06/21 12/05/21  [place] Donnamarie Poag, DO  mirtazapine (REMERON) 15 MG tablet Take 15 mg by mouth at bedtime. 11/01/20 11/01/21  [provider]  NIFEdipine (PROCARDIA XL/NIFEDICAL-XL) 90 MG 24 hr tablet Take 1 tablet (90 mg total) by mouth daily. 09/06/21 12/05/21  [place] Donnamarie Poag, DO  amLODipine (NORVASC) 10 MG tablet Take 1 tablet (10 mg total) by mouth every evening. Patient not taking: No sig reported 09/13/15 12/14/20  [name], MD      Allergies    Tape and Zosyn [piperacillin sod-tazobactam so]    Review of Systems   Review of Systems  Physical Exam Updated Vital Signs BP 95/69 (BP Location: Right Arm)   Pulse 76   Temp 98 F (36.7 C) (Oral)   Resp (!) 22   SpO2 90%  Physical Exam Constitutional:      General: She is not in acute distress. HENT:     Head: Normocephalic and atraumatic.  Eyes:     Conjunctiva/sclera: Conjunctivae normal.     Pupils: Pupils are equal, round, and  reactive to light.  Cardiovascular:     Rate and Rhythm: Normal rate and regular rhythm.  Pulmonary:     Effort: Pulmonary effort is normal. No respiratory distress.     Comments: 2L [place] Abdominal:     General: There is no distension.     Tenderness: There is no abdominal tenderness.  Skin:    General: Skin is warm and dry.  Neurological:     General: No focal deficit present.     Mental Status: She is alert and oriented to person, place, and time. Mental status is at baseline.  Psychiatric:        Mood and Affect: Mood normal.        Behavior: Behavior normal.     ED Results / Procedures / Treatments   Labs (all labs ordered are listed, but only abnormal results are displayed) Labs Reviewed  CBC WITH DIFFERENTIAL/PLATELET - Abnormal; Notable for the following components:      Result Value   RBC 3.22 (*)    Hemoglobin 10.6 (*)    HCT 33.6 (*)    MCV 104.3 (*)    RDW 19.4 (*)    Platelets 96 (*)    All other components within normal limits  I-STAT CHEM 8, ED - Abnormal; Notable for the following components:   Potassium 5.3 (*)  BUN 33 (*)    Creatinine, Ser 6.00 (*)    Glucose, Bld 123 (*)    Calcium, Ion 1.10 (*)    Hemoglobin 11.2 (*)    HCT 33.0 (*)    All other components within normal limits  COMPREHENSIVE METABOLIC PANEL  BRAIN NATRIURETIC PEPTIDE  TROPONIN I (HIGH SENSITIVITY)    EKG None  Radiology DG Chest 1 View  Result Date: 02/09/2022 CLINICAL DATA:  Hypotension and shortness of breath EXAM: CHEST  1 VIEW COMPARISON:  Chest radiograph 09/05/2018 FINDINGS: Heart is markedly enlarged, similar to the study from 09/04/2021. The upper mediastinal contours are stable. There is vascular congestion without evidence of overt edema. There is patchy retrocardiac opacity with silhouetting of the medial left hemidiaphragm new since the prior study. There is no other focal consolidation. There is no pleural effusion. There is no pneumothorax There is no acute  osseous abnormality. IMPRESSION: 1. Marked cardiomegaly with vascular congestion but no definite overt pulmonary edema. 2. Patchy retrocardiac opacities are new since the prior study and could reflect infection in the correct clinical setting. Electronically Signed   By: Valetta Mole M.D.   On: 2022/02/09 12:52    Procedures Procedures    Medications Ordered in ED Medications - No data to display  ED Course/ Medical Decision Making/ A&P Clinical Course as of 2022/02/09 1613  Mon 09-Feb-2022 Spoke to nephrologist who will add pt to dialysis list  [MT]    Clinical Course User Index [MT] Harleigh Civello, Carola Rhine, MD                           Medical Decision Making Risk Decision regarding hospitalization.   This patient presents to the ED with concern for lightheadedness. This involves an extensive number of treatment options, and is a complaint that carries with it a high risk of complications and morbidity.  The differential diagnosis includes hypotension vs anemia vs arrhythmia vs other  Co-morbidities that complicate the patient evaluation: ESRD on dialysis  Additional history obtained from EMS  External records from outside source obtained and reviewed including echo w/ small pericardial effusion in March  Bedside echo here shows persistent small pericardial effusion, no tamponade, per my interpretation.  Formal echo pending at time of admission.  I ordered and personally interpreted labs.  The pertinent results include:  trop chronically elevated, near baseline; K 5.1, Cr at baseline; no emergent findings; hgb near baseline levels  I ordered imaging studies including dg chest I independently visualized and interpreted imaging which showed cardiomegaly & vascular congestion I agree with the radiologist interpretation  The patient was maintained on a cardiac monitor.  I personally viewed and interpreted the cardiac monitored which showed an underlying rhythm of: regular HR  I  have reviewed the patients home medicines and have made adjustments as needed  Test Considered: Doubt acute PE clinically at this time; doubt PRES Doubt CVA at this time.  I requested consultation with the nephrology,  and discussed lab and imaging findings as well as pertinent plan - they recommend: will evaluate pt for dialysis (non-emergent, but expected while in hospital)  After the interventions noted above, I reevaluated the patient and found that they have: stayed the same  Pt remains with stable BP and O2 requirement at time of medical admission.  Not requiring vasopressors or intubation or bipap at this time.  Dispostion:  After consideration of the diagnostic results and the  patients response to treatment, I feel that the patent would benefit from medical admission.         Final Clinical Impression(s) / ED Diagnoses Final diagnoses:  None    Rx / DC Orders ED Discharge Orders     None         Wyvonnia Dusky, MD Jan 22, 2022 1616

## 2022-01-22 NOTE — Procedures (Signed)
Cardiac Arrest  Pulseless, PEA at 1626  Received ACLS medications and CPR per ACLS protocol, as well as 2 amp bicarb, 50 d50, 1 calglu PEA arrest at each pulse check  Intubated peri arrest IO placed peri arrest   ROSC at 102   Full consult note and separate procedure notes to follow   Eliseo Gum MSN, AGACNP-BC [place] for pager 2022-02-19, 4:43 PM

## 2022-01-22 NOTE — Progress Notes (Signed)
Echocardiogram 2D Echocardiogram has been performed.  Oneal Deputy Dorraine Ellender RDCS Feb 09, 2022, 3:14 PM  Notified Dr. Johney Frame of stat echo at 3:10

## 2022-01-22 NOTE — ED Triage Notes (Signed)
Pt went to dialysis today and after getting home- pt feeling weak, dizzy and having leg pain. EMS arrived, pt repetitive questions, pain all over, hypotensive BP 90/60, CBG 157, SR 80. Denies sob,cp Pt's baseline BP is upper 993'T-701 systolic.  Pt states she did not complete her whole dialysis treatment today. "It was too much."

## 2022-01-22 NOTE — Progress Notes (Signed)
Patient transported to CT and then to 2M09 from ED with out complications. RN at bedside.

## 2022-01-22 NOTE — Progress Notes (Signed)
Patient time of death 34 confirmed by Waymon Budge, RN and Marthe Patch, RN. Family at bedside. All questions answered.

## 2022-01-22 NOTE — Progress Notes (Signed)
Chaplain responded to call from nurse, Patent made DNR.  Patient is only child.  Parents are deceased. Yet family was there. Uncle, aunt, cousin, York Ram, [employer] employee) and another person.  Chaplain offered support and prayer. Family made decision for DNR with pt's grandmother, next of kin.  Family at peace, has faith that her suffering will come to an end and she's ready to go. Chaplain is available if needed still. Rev. Tamsen Snider Pager [phone]

## 2022-01-22 NOTE — ED Notes (Signed)
Shortly after intubating pt, pt became markedly hypotensive and bradycardic. Loss of pulses and CPR initiated. MD Chand at bedside.

## 2022-01-22 NOTE — ED Notes (Signed)
Trop 127-Reported to MD Langston Masker

## 2022-01-22 NOTE — Progress Notes (Signed)
Patient became obtunded and hard to arouse and significant tachypnea. Called PCCM for intubation. Given the significant deterioration of mentation, stat CT head ordered.

## 2022-01-22 NOTE — H&P (Addendum)
History and Physical    Kerry Cook KGM:[id number] DOB: 08/30/1985 DOA: 2022-01-27  PCP: Patient, No Pcp Per (Confirm with patient/family/NH records and if not entered, this has to be entered at [facility] point of entry) Patient coming from: Home  I have personally briefly reviewed patient's old medical records in [place]  Chief Complaint: Feeling tired and light headed.  HPI: Kerry Cook is a 36 y.o. female with medical history significant of ESRD on HD MWF, refractory HTN, chronic HFpEF, peptic ulcer, small pericardial effusion March 2023, sent from HD center for evaluation of hypotension.  Patient started to feel lightheadedness and sleepiness since yesterday, denies any shortness of breath, no chest pain no abdominal pain no fever chills or cough.  This morning, patient went to her routine dialysis.  When it was found patient blood pressure on the low side and during dialysis, patient continued to experience feeling of lightheadedness and sleepiness, and blood pressure further dropped.  HD was aborted and patient sent to ED.  Denies any diarrhea or abdominal pain.  ED Course: Blood pressure borderline low SBP 65-67.  Afebrile, Borderline tachypneic, no tachycardia.  Chest x-ray showed cardiomegaly, no significant pulmonary infiltrates.  Review of Systems: As per HPI otherwise 14 point review of systems negative.    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 and is back on HD MWF   History of kidney transplant    1st transplant was 2012- 2015, 2nd transplant was 2018- 2022 approx   Hypertension    Multiple gastric ulcers    Pneumonia 01/23/2015    Past Surgical History:  Procedure Laterality Date   ESOPHAGOGASTRODUODENOSCOPY N/A 04/01/2016   Procedure: ESOPHAGOGASTRODUODENOSCOPY (EGD);  Surgeon: Gatha Mayer, MD;  Location: [facility] ENDOSCOPY;  Service: Endoscopy;   Laterality: N/A;   ESOPHAGOGASTRODUODENOSCOPY (EGD) WITH PROPOFOL N/A 11/20/2016   Procedure: ESOPHAGOGASTRODUODENOSCOPY (EGD) WITH PROPOFOL;  Surgeon: Doran Stabler, MD;  Location: [facility];  Service: Endoscopy;  Laterality: N/A;   KIDNEY TRANSPLANT Bilateral 2012     reports that she has never smoked. She has never used smokeless tobacco. She reports that she does not drink alcohol and does not use drugs.  Allergies  Allergen Reactions   Tape Itching and Other (See Comments)    Plastic/clear tape tears up the patient's skin   Zosyn [Piperacillin Sod-Tazobactam So] Itching    Has patient had a PCN reaction causing immediate rash, facial/tongue/throat swelling, SOB or lightheadedness with hypotension: No Has patient had a PCN reaction causing severe rash involving mucus membranes or skin necrosis: No Has patient had a PCN reaction that required hospitalization: No Has patient had a PCN reaction occurring within the last 10 years: No If all of the above answers are "NO", then may proceed with Cephalosporin use.     Family History  Problem Relation Age of Onset   Kidney disease Father    Lupus Maternal Grandmother    Cancer Maternal Grandfather    Colon cancer Neg Hx    Stomach cancer Neg Hx    Rectal cancer Neg Hx    Esophageal cancer Neg Hx    Liver cancer Neg Hx      Prior to Admission medications   Medication Sig Start Date End Date Taking? Authorizing Provider  carvedilol (COREG) 25 MG tablet Take 1 tablet (25 mg total) by mouth every morning. 09/06/21  03/02/22  [place] Donnamarie Poag, DO  cloNIDine (CATAPRES - DOSED IN MG/24 HR) 0.3 mg/24hr patch Place 0.3 mg onto the skin once a week. 01/07/22   [provider]  ferric citrate (AURYXIA) 1 GM 210 MG(Fe) tablet Take 420 mg by mouth 3 (three) times daily with meals.    [provider]  losartan (COZAAR) 100 MG tablet Take 1 tablet (100 mg total) by mouth daily. 09/06/21 12/05/21  [place] Donnamarie Poag, DO  mirtazapine  (REMERON) 15 MG tablet Take 15 mg by mouth at bedtime. 11/01/20 11/01/21  [provider]  NIFEdipine (PROCARDIA XL/NIFEDICAL-XL) 90 MG 24 hr tablet Take 1 tablet (90 mg total) by mouth daily. 09/06/21 12/05/21  [place] Donnamarie Poag, DO  pantoprazole (PROTONIX) 20 MG tablet Take 20 mg by mouth daily. 01/12/22   [provider]  amLODipine (NORVASC) 10 MG tablet Take 1 tablet (10 mg total) by mouth every evening. Patient not taking: No sig reported 09/13/15 12/14/20  [name], MD    Physical Exam: Vitals:   2022-02-15 1219 2022-02-15 1223 02-15-22 1415 2022-02-15 1417  BP: (!) 127/101 95/69 101/73   Pulse: 76  74 74  Resp: (!) 22  (!) 22 (!) 23  Temp: 98 F (36.7 C)     TempSrc: Oral     SpO2: (!) 88% 90% 99% 95%    Constitutional: NAD, calm, comfortable Vitals:   2022-02-15 1219 02/15/2022 1223 02-15-22 1415 02/15/22 1417  BP: (!) 127/101 95/69 101/73   Pulse: 76  74 74  Resp: (!) 22  (!) 22 (!) 23  Temp: 98 F (36.7 C)     TempSrc: Oral     SpO2: (!) 88% 90% 99% 95%   Eyes: PERRL, lids and conjunctivae normal ENMT: Mucous membranes are moist. Posterior pharynx clear of any exudate or lesions.Normal dentition.  Neck: normal, supple, no masses, no thyromegaly.  JVD about 7-8 cm about clavicle Respiratory: clear to auscultation bilaterally, no wheezing, bilateral lower fields fine crackles. Normal respiratory effort. No accessory muscle use.  Cardiovascular: Regular rate and rhythm, no murmurs / rubs / gallops.  2+ extremity edema. 2+ pedal pulses. No carotid bruits.  Weak bilateral radial pulses Abdomen: no tenderness, no masses palpated. No hepatosplenomegaly. Bowel sounds positive.  Musculoskeletal: no clubbing / cyanosis. No joint deformity upper and lower extremities. Good ROM, no contractures. Normal muscle tone.  Skin: no rashes, lesions, ulcers. No induration Neurologic: No facial droops, moving all limbs, following simple commands Psychiatric: Drowsy, easily  aroused    Labs on Admission: I have personally reviewed following labs and imaging studies  CBC: Recent Labs  Lab 2022-02-15 1250 2022-02-15 1302  WBC 5.2  --   NEUTROABS 3.8  --   HGB 10.6* 11.2*  HCT 33.6* 33.0*  MCV 104.3*  --   PLT 96*  --    Basic Metabolic Panel: Recent Labs  Lab 02/15/22 1250 2022-02-15 1302  NA 136 135  K 5.1 5.3*  CL 100 101  CO2 23  --   GLUCOSE 123* 123*  BUN 26* 33*  CREATININE 5.39* 6.00*  CALCIUM 9.4  --    GFR: CrCl cannot be calculated (Unknown ideal weight.). Liver Function Tests: Recent Labs  Lab February 15, 2022 1250  AST 54*  ALT 52*  ALKPHOS 119  BILITOT 1.3*  PROT 6.3*  ALBUMIN 2.6*   No results for input(s): "LIPASE", "AMYLASE" in the last 168 hours. No results for input(s): "AMMONIA" in the last 168 hours. Coagulation Profile: No results  for input(s): "INR", "PROTIME" in the last 168 hours. Cardiac Enzymes: No results for input(s): "CKTOTAL", "CKMB", "CKMBINDEX", "TROPONINI" in the last 168 hours. BNP (last 3 results) No results for input(s): "PROBNP" in the last 8760 hours. HbA1C: No results for input(s): "HGBA1C" in the last 72 hours. CBG: No results for input(s): "GLUCAP" in the last 168 hours. Lipid Profile: No results for input(s): "CHOL", "HDL", "LDLCALC", "TRIG", "CHOLHDL", "LDLDIRECT" in the last 72 hours. Thyroid Function Tests: No results for input(s): "TSH", "T4TOTAL", "FREET4", "T3FREE", "THYROIDAB" in the last 72 hours. Anemia Panel: No results for input(s): "VITAMINB12", "FOLATE", "FERRITIN", "TIBC", "IRON", "RETICCTPCT" in the last 72 hours. Urine analysis:    Component Value Date/Time   COLORURINE YELLOW 10/19/2019 1230   APPEARANCEUR CLEAR 10/19/2019 1230   LABSPEC 1.017 10/19/2019 1230   PHURINE 5.0 10/19/2019 1230   GLUCOSEU NEGATIVE 10/19/2019 1230   HGBUR LARGE (A) 10/19/2019 1230   BILIRUBINUR NEGATIVE 10/19/2019 1230   KETONESUR NEGATIVE 10/19/2019 1230   PROTEINUR >=300 (A) 10/19/2019 1230    UROBILINOGEN 0.2 01/21/2015 1343   NITRITE NEGATIVE 10/19/2019 1230   LEUKOCYTESUR NEGATIVE 10/19/2019 1230    Radiological Exams on Admission: DG Chest 1 View  Result Date: 2022/01/25 CLINICAL DATA:  Hypotension and shortness of breath EXAM: CHEST  1 VIEW COMPARISON:  Chest radiograph 09/05/2018 FINDINGS: Heart is markedly enlarged, similar to the study from 09/04/2021. The upper mediastinal contours are stable. There is vascular congestion without evidence of overt edema. There is patchy retrocardiac opacity with silhouetting of the medial left hemidiaphragm new since the prior study. There is no other focal consolidation. There is no pleural effusion. There is no pneumothorax There is no acute osseous abnormality. IMPRESSION: 1. Marked cardiomegaly with vascular congestion but no definite overt pulmonary edema. 2. Patchy retrocardiac opacities are new since the prior study and could reflect infection in the correct clinical setting. Electronically Signed   By: Valetta Mole M.D.   On: 01/25/22 12:52    EKG: Independently reviewed.   Assessment/Plan Principal Problem:   CHF (congestive heart failure) (HCC) Active Problems:   Hypotension  (please populate well all problems here in Problem List. (For example, if patient is on BP meds at home and you resume or decide to hold them, it is a problem that needs to be her. Same for CAD, COPD, HLD and so on)  Hypotension -Rule out cardiac tamponade given recommendations of hypotension weak pulses, cardiomegaly on x-ray, JVD distention as well as history of pericardial effusion March. -Stat echocardiogram, to call CT surgery if positive findings. -Blood pressure too low to allow for diuresis or emergency HD at this point. -Admit to PCU for close monitoring -Other Ddx, doubt sepsis given no significant systemic inflammation signs, denies any cough no diarrhea no skin lesions. No chest pains, low suspicion for dissection at this point. -Clonidine  patched removed at home.  Elevated trop -Suspect demanding ischemia from hypotension and HF decompensation. Echo is being done -Trend trop  Acute on chronic HFpEF decompensation -As above.  HTN -Hold off for home BP meds  ESRD on HD -To have signs of fluid overload however hemodynamically unstable for HD today, discussed with nephro PA at bedside.  Peptic ulcer -PPI twice daily  DVT prophylaxis: Low for chemical DVT prophylaxis today, start heparin subcu tomorrow if H&H stable. Code Status: Full code Family Communication: None at bedside Disposition Plan: Patient sick with hypertension with suspected developing tamponade, may require CT surgery intervention, expect more than 2 midnight hospital  stay. Consults called: Nephrology Admission status: PCU   Lequita Halt MD [facility] Pager [phone] 01-31-2022, 2:59 PM

## 2022-01-22 NOTE — ED Notes (Signed)
Alwyn Pea and CCM MD notified of lactic 6.0

## 2022-01-22 NOTE — Progress Notes (Signed)
D/W Dr. Langston Masker, who was at bedside and reviewed Echo image, there appears to be small amount of pericardial effusion and no signs of tamponade.

## 2022-01-22 NOTE — ED Provider Triage Note (Signed)
Emergency Medicine Provider Triage Evaluation Note  Kerry Cook , a 36 y.o. female  was evaluated in triage.  Patient noncompliant with interview and does not answer questions.  She does appear uncomfortable on exam.  She has history of end-stage renal disease and is on dialysis.  Was unable to complete her dialysis session today.  Noted to have soft BP in triage along with O2 sats of 88%.  Signs of volume overload present.  Review of Systems  Positive: As above Negative: As above  Physical Exam  BP 95/69 (BP Location: Right Arm)   Pulse 76   Temp 98 F (36.7 C) (Oral)   Resp (!) 22   SpO2 90%  Gen:   Awake, no distress   Resp:  Normal effort  MSK:   Moves extremities without difficulty  Other:  Bilateral peripheral pitting edema present.  abdominal distention present  Medical Decision Making  Medically screening exam initiated at 12:28 PM.  Appropriate orders placed.  Kerry Cook was informed that the remainder of the evaluation will be completed by another provider, this initial triage assessment does not replace that evaluation, and the importance of remaining in the ED until their evaluation is complete.     Evlyn Courier, PA-C 11-Feb-2022 1229

## 2022-01-22 NOTE — ED Notes (Addendum)
ED TO INPATIENT HANDOFF REPORT  ED Nurse Name and Phone #: Deneise Lever [phone]  S Name/Age/Gender Kerry Cook 36 y.o. female Room/Bed: 033C/033C  Code Status   Code Status: Full Code  Home/SNF/Other Home Patient oriented to: self, place, time, and situation Is this baseline? Yes   Triage Complete: Triage complete  Chief Complaint CHF (congestive heart failure) ([place]) [I50.9]  Triage Note Pt went to dialysis today and after getting home- pt feeling weak, dizzy and having leg pain. EMS arrived, pt repetitive questions, pain all over, hypotensive BP 90/60, CBG 157, SR 80. Denies sob,cp Pt's baseline BP is upper 696'V-893 systolic.  Pt states she did not complete her whole dialysis treatment today. "It was too much."    Allergies Allergies  Allergen Reactions   Tape Itching and Other (See Comments)    Plastic/clear tape tears up the patient's skin   Zosyn [Piperacillin Sod-Tazobactam So] Itching    Has patient had a PCN reaction causing immediate rash, facial/tongue/throat swelling, SOB or lightheadedness with hypotension: No Has patient had a PCN reaction causing severe rash involving mucus membranes or skin necrosis: No Has patient had a PCN reaction that required hospitalization: No Has patient had a PCN reaction occurring within the last 10 years: No If all of the above answers are "NO", then may proceed with Cephalosporin use.     Level of Care/Admitting Diagnosis ED Disposition     ED Disposition  Admit   Condition  --   [place]: [place] [[id number]]  Level of Care: Progressive [102]  Admit to Progressive based on following criteria: CARDIOVASCULAR & THORACIC of moderate stability with acute coronary syndrome symptoms/low risk myocardial infarction/hypertensive urgency/arrhythmias/heart failure potentially compromising stability and stable post cardiovascular intervention patients.  May admit patient to [facility] or [facility]  if equivalent level of care is available:: No  Covid Evaluation: Asymptomatic - no recent exposure (last 10 days) testing not required  Diagnosis: CHF (congestive heart failure) [facility]) [[id number]]  Admitting Physician: Lequita Halt [[id number]]  Attending Physician: Lequita Halt [[id number]]  Certification:: I certify this patient will need inpatient services for at least 2 midnights  Estimated Length of Stay: 3          B Medical/Surgery History Past Medical History:  Diagnosis Date   ESRD on hemodialysis ([place])    started HD in 2010, has had kidney transplant x 2. 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 and is back on HD MWF   History of kidney transplant    1st transplant was 2012- 2015, 2nd transplant was 2018- 2022 approx   Hypertension    Multiple gastric ulcers    Pneumonia 01/23/2015   Past Surgical History:  Procedure Laterality Date   ESOPHAGOGASTRODUODENOSCOPY N/A 04/01/2016   Procedure: ESOPHAGOGASTRODUODENOSCOPY (EGD);  Surgeon: Gatha Mayer, MD;  Location: [facility] ENDOSCOPY;  Service: Endoscopy;  Laterality: N/A;   ESOPHAGOGASTRODUODENOSCOPY (EGD) WITH PROPOFOL N/A 11/20/2016   Procedure: ESOPHAGOGASTRODUODENOSCOPY (EGD) WITH PROPOFOL;  Surgeon: Doran Stabler, MD;  Location: [place];  Service: Endoscopy;  Laterality: N/A;   KIDNEY TRANSPLANT Bilateral 2012     A IV Location/Drains/Wounds Patient Lines/Drains/Airways Status     Active Line/Drains/Airways     Name Placement date Placement time Site Days   Fistula / Graft Left Forearm Arteriovenous fistula --  --  Forearm  --            Intake/Output Last 24 hours No intake or output data in the 24 hours  ending 2022-02-04 1527  Labs/Imaging Results for orders placed or performed during the hospital encounter of 2022-02-04 (from the past 48 hour(s))  CBC with Differential     Status: Abnormal   Collection Time: 02-04-2022 12:50 PM  Result Value Ref Range   WBC 5.2 4.0 - 10.5 K/uL   RBC 3.22 (L) 3.87 - 5.11 MIL/uL   Hemoglobin  10.6 (L) 12.0 - 15.0 g/dL   HCT 33.6 (L) 36.0 - 46.0 %   MCV 104.3 (H) 80.0 - 100.0 fL   MCH 32.9 26.0 - 34.0 pg   MCHC 31.5 30.0 - 36.0 g/dL   RDW 19.4 (H) 11.5 - 15.5 %   Platelets 96 (L) 150 - 400 K/uL    Comment: Immature Platelet Fraction may be clinically indicated, consider ordering this additional test [id number] REPEATED TO VERIFY    nRBC 0.0 0.0 - 0.2 %   Neutrophils Relative % 73 %   Neutro Abs 3.8 1.7 - 7.7 K/uL   Lymphocytes Relative 17 %   Lymphs Abs 0.9 0.7 - 4.0 K/uL   Monocytes Relative 6 %   Monocytes Absolute 0.3 0.1 - 1.0 K/uL   Eosinophils Relative 3 %   Eosinophils Absolute 0.1 0.0 - 0.5 K/uL   Basophils Relative 1 %   Basophils Absolute 0.0 0.0 - 0.1 K/uL   Immature Granulocytes 0 %   Abs Immature Granulocytes 0.01 0.00 - 0.07 K/uL    Comment: Performed at [facility], [address] [place], [place] [address]  Comprehensive metabolic panel     Status: Abnormal   Collection Time: 2022/02/04 12:50 PM  Result Value Ref Range   Sodium 136 135 - 145 mmol/L   Potassium 5.1 3.5 - 5.1 mmol/L   Chloride 100 98 - 111 mmol/L   CO2 23 22 - 32 mmol/L   Glucose, Bld 123 (H) 70 - 99 mg/dL    Comment: Glucose reference range applies only to samples taken after fasting for at least 8 hours.   BUN 26 (H) 6 - 20 mg/dL   Creatinine, Ser 5.39 (H) 0.44 - 1.00 mg/dL   Calcium 9.4 8.9 - 10.3 mg/dL   Total Protein 6.3 (L) 6.5 - 8.1 g/dL   Albumin 2.6 (L) 3.5 - 5.0 g/dL   AST 54 (H) 15 - 41 U/L   ALT 52 (H) 0 - 44 U/L   Alkaline Phosphatase 119 38 - 126 U/L   Total Bilirubin 1.3 (H) 0.3 - 1.2 mg/dL   GFR, Estimated 10 (L) >60 mL/min    Comment: (NOTE) Calculated using the CKD-EPI Creatinine Equation (2021)    Anion gap 13 5 - 15    Comment: Performed at [facility], [place] [address], [place], [place] [address]  Brain natriuretic peptide     Status: Abnormal   Collection Time: 02-04-22 12:50 PM  Result Value Ref Range   B Natriuretic Peptide 3,687.9 (H) 0.0  - 100.0 pg/mL    Comment: Performed at [facility] [address], [place], [place] [address]  Troponin I (High Sensitivity)     Status: Abnormal   Collection Time: 04-Feb-2022 12:50 PM  Result Value Ref Range   Troponin I (High Sensitivity) 127 (HH) <18 ng/L    Comment: CRITICAL RESULT CALLED TO, READ BACK BY AND VERIFIED WITH A.Irasema Chalk RN [id number] 04-Feb-2022 MCCORMICK K (NOTE) Elevated high sensitivity troponin I (hsTnI) values and significant  changes across serial measurements may suggest ACS but many other  chronic  and acute conditions are known to elevate hsTnI results.  Refer to the "Links" section for chest pain algorithms and additional  guidance. Performed at [facility], [place] [address], [place], [place] [address]   I-stat chem 8, ED (not at [facility] or [facility])     Status: Abnormal   Collection Time: 02/04/2022  1:02 PM  Result Value Ref Range   Sodium 135 135 - 145 mmol/L   Potassium 5.3 (H) 3.5 - 5.1 mmol/L   Chloride 101 98 - 111 mmol/L   BUN 33 (H) 6 - 20 mg/dL   Creatinine, Ser 6.00 (H) 0.44 - 1.00 mg/dL   Glucose, Bld 123 (H) 70 - 99 mg/dL    Comment: Glucose reference range applies only to samples taken after fasting for at least 8 hours.   Calcium, Ion 1.10 (L) 1.15 - 1.40 mmol/L   TCO2 27 22 - 32 mmol/L   Hemoglobin 11.2 (L) 12.0 - 15.0 g/dL   HCT 33.0 (L) 36.0 - 46.0 %   ECHOCARDIOGRAM LIMITED  Result Date: 2022/02/04    ECHOCARDIOGRAM LIMITED REPORT   Patient Name:   Kerry Cook [place] Date of Exam: 02-04-2022 Medical Rec #:  [id number]          Height:       60.0 in Accession #:    [id number]         Weight:       121.5 lb Date of Birth:  11/08/1985           BSA:          1.510 m Patient Age:    36 years           BP:           84/68 mmHg Patient Gender: F                  HR:           73 bpm. Exam Location:  Inpatient Procedure: 2D Echo, Color Doppler, Cardiac Doppler and Strain Analysis STAT ECHO Indications:    I31.3 Pericardial effusion  History:        Patient  has prior history of Echocardiogram examinations, most                 recent 09/03/2021. CHF, ESRD; Risk Factors:Hypertension.  Sonographer:    Raquel Sarna Senior RDCS Referring Phys: [id number] [place]  1. There is severe concentric left ventricular hypertrophy with abnormal strain that pattern that may suggest infiltrative process. Recommend CMR to assess for HCM or amyloidosis given degree of LVH and abnormal strain pattern.  2. Left ventricular ejection fraction, by estimation, is 35 to 40%. The left ventricle has moderately decreased function. The left ventricle demonstrates global hypokinesis. There is severe concentric left ventricular hypertrophy. The average left ventricular global longitudinal strain is -6.0 %. The global longitudinal strain is abnormal.  3. Right ventricular systolic function is moderately reduced. The right ventricular size is normal. Mildly increased right ventricular wall thickness. There is normal pulmonary artery systolic pressure.  4. A small pericardial effusion is present. The pericardial effusion is circumferential. There is no evidence of cardiac tamponade.  5. The mitral valve is grossly normal. Trivial mitral valve regurgitation.  6. The aortic valve is tricuspid. There is mild thickening of the aortic valve.  7. Evidence of atrial level shunting detected by color flow Doppler.  8. Comparison(s): Compared to prior TTE in 08/2021, the LVEF has dropped to  35-40% with strain dropping from 22% to 6%. Recommend CMR for further evaluation as detailed above. FINDINGS  Left Ventricle: Left ventricular ejection fraction, by estimation, is 35 to 40%. The left ventricle has moderately decreased function. The left ventricle demonstrates global hypokinesis. The average left ventricular global longitudinal strain is -6.0 %.  The global longitudinal strain is abnormal. There is severe concentric left ventricular hypertrophy. Right Ventricle: The right ventricular size is normal.  Mildly increased right ventricular wall thickness. Right ventricular systolic function is moderately reduced. There is normal pulmonary artery systolic pressure. The tricuspid regurgitant velocity is  1.70 m/s, and with an assumed right atrial pressure of 15 mmHg, the estimated right ventricular systolic pressure is 86.7 mmHg. Pericardium: A small pericardial effusion is present. The pericardial effusion is circumferential. There is no evidence of cardiac tamponade. Mitral Valve: The mitral valve is grossly normal. There is mild thickening of the mitral valve leaflet(s). Trivial mitral valve regurgitation. Tricuspid Valve: The tricuspid valve is normal in structure. Tricuspid valve regurgitation is moderate. Aortic Valve: The aortic valve is tricuspid. There is mild thickening of the aortic valve. Pulmonic Valve: The pulmonic valve was normal in structure. IAS/Shunts: Evidence of atrial level shunting detected by color flow Doppler.  LV Volumes (MOD) LV vol d, MOD A2C: 114.0 ml LV vol d, MOD A4C: 93.8 ml  2D Longitudinal Strain LV vol s, MOD A2C: 63.3 ml  2D Strain GLS Avg:     -6.0 % LV vol s, MOD A4C: 51.2 ml LV SV MOD A2C:     50.7 ml LV SV MOD A4C:     93.8 ml LV SV MOD BP:      47.6 ml RIGHT VENTRICLE RV S prime:     4.90 cm/s TAPSE (M-mode): 1.2 cm AORTIC VALVE LVOT Vmax:   95.90 cm/s LVOT Vmean:  65.700 cm/s LVOT VTI:    0.145 m TRICUSPID VALVE TR Peak grad:   11.6 mmHg TR Vmax:        170.00 cm/s  SHUNTS Systemic VTI: 0.14 m Gwyndolyn Kaufman MD Electronically signed by Gwyndolyn Kaufman MD Signature Date/Time: 02/01/22/3:25:21 PM    Final    DG Chest 1 View  Result Date: 02-01-2022 CLINICAL DATA:  Hypotension and shortness of breath EXAM: CHEST  1 VIEW COMPARISON:  Chest radiograph 09/05/2018 FINDINGS: Heart is markedly enlarged, similar to the study from 09/04/2021. The upper mediastinal contours are stable. There is vascular congestion without evidence of overt edema. There is patchy retrocardiac  opacity with silhouetting of the medial left hemidiaphragm new since the prior study. There is no other focal consolidation. There is no pleural effusion. There is no pneumothorax There is no acute osseous abnormality. IMPRESSION: 1. Marked cardiomegaly with vascular congestion but no definite overt pulmonary edema. 2. Patchy retrocardiac opacities are new since the prior study and could reflect infection in the correct clinical setting. Electronically Signed   By: Valetta Mole M.D.   On: February 01, 2022 12:52    Pending Labs Unresulted Labs (From admission, onward)     Start     Ordered   01/22/22 6195  Basic metabolic panel  Daily at 5am,   R     Comments: As Scheduled for 5 days    Feb 01, 2022 1449   01/22/22 0500  Hepatic function panel  Tomorrow morning,   R        Feb 01, 2022 1449   01/22/22 0500  CBC  Tomorrow morning,   R  02-09-2022 1457   Feb 09, 2022 1458  Lactic acid, plasma  STAT Now then every 3 hours,   R (with STAT occurrences)      February 09, 2022 1457   2022-02-09 1448  HIV Antibody (routine testing w rflx)  (HIV Antibody (Routine testing w reflex) panel)  Once,   R        02-09-2022 1449            Vitals/Pain Today's Vitals   Feb 09, 2022 1223 02-09-2022 1415 02-09-2022 1417 February 09, 2022 1515  BP: 95/69 101/73  90/71  Pulse:  74 74 67  Resp:  (!) 22 (!) 23 14  Temp:      TempSrc:      SpO2: 90% 99% 95% 99%    Isolation Precautions No active isolations  Medications Medications  sodium chloride flush (NS) 0.9 % injection 3 mL (has no administration in time range)  sodium chloride flush (NS) 0.9 % injection 3 mL (has no administration in time range)  0.9 %  sodium chloride infusion (has no administration in time range)  acetaminophen (TYLENOL) tablet 650 mg (has no administration in time range)  ondansetron (ZOFRAN) injection 4 mg (has no administration in time range)  mirtazapine (REMERON) tablet 15 mg (has no administration in time range)  ferric citrate (AURYXIA) tablet 420 mg (has  no administration in time range)  pantoprazole (PROTONIX) EC tablet 20 mg (has no administration in time range)  hydrALAZINE (APRESOLINE) tablet 25 mg (has no administration in time range)  heparin injection 5,000 Units (has no administration in time range)    Mobility walks High fall risk   Focused Assessments Cardiac Assessment Handoff:    Lab Results  Component Value Date   CKTOTAL 181 (H) 08/26/2007   CKMB 2.7 08/26/2007   TROPONINI 0.19 (H) 09/12/2015   No results found for: "DDIMER" Does the Patient currently have chest pain? No    R Recommendations: See Admitting Provider Note  Report given to:   Additional Notes: Pt arrives from dialysis, c/o weakness and "just being too much. Chronically elevated troponins. BNP elevated, coming in for CHF exacerbation. Has been sleeping soundly downhere, will occasionally wake up, yell and then go back to sleep. USGIV to R Fa. L arm is restricted 2/2 dialysis. GCS 15 A&Ox4

## 2022-01-22 NOTE — ED Notes (Signed)
Entered room to take pt upstairs to assigned inpt bed. Pt noted to be sleeping on belly w/ legs slightly off end of bed. Chest rise and fall noted. Pt had self removed all monitoring equipment. This RN tapped patients foot to wake her. Pt did not awaken when RN tapped foot. RN then tapped foot again w/ no response. Pt rolled over on to bad and this RN attempted sternal rub.Pt w/ minimal response to noxious stimuli. Pt replaced on monitoring and noted to have sats of 97%/ MD Roosevelt Locks made aware of decrease in mental status. MD Roosevelt Locks stated he would page CCM to come assess pt.

## 2022-01-22 NOTE — Procedures (Signed)
Arterial Catheter Insertion Procedure Note  Kerry Cook  [id number]  1985-08-11  Date:2022/02/12  Time:6:34 PM    Provider Performing: Jacky Kindle    Procedure: Insertion of Arterial Line [phone]) with US guidance (91980)   Indication(s) Blood pressure monitoring and/or need for frequent ABGs  Consent Unable to obtain consent due to emergent nature of procedure.  Anesthesia None   Time Out Verified patient identification, verified procedure, site/side was marked, verified correct patient position, special equipment/implants available, medications/allergies/relevant history reviewed, required imaging and test results available.   Sterile Technique Maximal sterile technique including full sterile barrier drape, hand hygiene, sterile gown, sterile gloves, mask, hair covering, sterile ultrasound probe cover (if used).   Procedure Description Area of catheter insertion was cleaned with chlorhexidine and draped in sterile fashion. With real-time ultrasound guidance an arterial catheter was placed into the right  Axillary  artery.  Appropriate arterial tracings confirmed on monitor.     Complications/Tolerance None; patient tolerated the procedure well.   EBL Minimal   Specimen(s) None

## 2022-01-22 NOTE — Procedures (Signed)
Bronchoscopy Procedure Note  Kerry Cook  [id number]  Feb 19, 1986  Date:2022/02/16  Time:6:35 PM   Provider Performing:Tiasha Helvie   Procedure(s):  Flexible bronchoscopy with bronchial alveolar lavage (59977)  Indication(s) Acute hypoxic respiratory failure due to aspiration pneumonia  Consent Unable to obtain consent due to emergent nature of procedure.  Anesthesia Propofol   Time Out Verified patient identification, verified procedure, site/side was marked, verified correct patient position, special equipment/implants available, medications/allergies/relevant history reviewed, required imaging and test results available.   Sterile Technique Usual hand hygiene, masks, gowns, and gloves were used   Procedure Description Bronchoscope advanced through endotracheal tube and into airway.  Airways were examined down to subsegmental level with findings noted below.   Following diagnostic evaluation, BAL(s) performed in left lower lobe with normal saline and return of vomitus fluid  Findings: Inflamed airway throughout, vomitus noted in bilateral lower lobe, BAL was performed in left lower lobe   Complications/Tolerance None; patient tolerated the procedure well. Chest X-ray is not needed post procedure.   EBL Minimal   Specimen(s) Sputum/BAL

## 2022-01-22 NOTE — Code Documentation (Signed)
CCM remains at bedside during and post code RT remains at bedside

## 2022-01-22 NOTE — Progress Notes (Signed)
Heart Failure Navigator Progress Note  Assessed for Heart & Vascular TOC clinic readiness.  Patient does not meet criteria due to ESRD on HD.   Kerby Nora, PharmD, BCPS Heart Failure Stewardship Pharmacist Phone [phone]

## 2022-01-22 NOTE — Final Progress Note (Addendum)
1800 pm- Patient arrived to unit [place], PA at bedside. Instructed RN to gather supplies for arterial and central line placement and bronchoscopy.   1805- Assisted by Lyla Son and Azzie Almas, RN with getting patient on clean linen and skin assessment, no skin breakdown noted. New CPR pads placed at this time as there were rips in previously used pads.   1823- R axillary arterial line placed by Dr. Tacy Learn with ultrasound guidance, BP reading after zeroing arterial line 63/58, 0.5 mg Epi given IVP on verbal order from Dr. Tacy Learn.   1835- Bronchoscopy complete by Dr. Tacy Learn   1837-iSTAT ABG complete at bedside by this RN, results given to Dr. Tacy Learn who increased respiratory rate on ventilator accordingly.   1855- Epi drip ordered due to continued hypotension, started at 5 mcg/min per Dr. Tacy Learn.   1859- Epi drip rate increased to max dose due to hypotension. Epi push 1mg  ordered and given per Dr. Tacy Learn  1900- L tibial IO placed by Dr. Tacy Learn  1906- Dr. Tacy Learn back at bedside due to bradycardia, code cart pulled into room. Code cart opened and atropine 1 mg and sodium bicarbonate 50 meQ push given verbal per Dr. Tacy Learn. At this time, family at bedside agreed to DNR after discussing prognosis with Dr. Tacy Learn.

## 2022-01-22 NOTE — Procedures (Signed)
Code Blue   Progressive hypoxia and bradycardia despite vent support, escalating pressors. Received Atropine.  PEA arrest at 1700. Received resuscitation per ACLS protocol -- Epi and CPR. Also received Bicarb.  NE incr to 80 during code.   ROSC achieved at [place] added after [place] MSN, AGACNP-BC [place] for pager  02-16-2022, 5:48 PM

## 2022-01-22 NOTE — Consult Note (Addendum)
[facility] Renal Consultation Note    Indication for Consultation:  Management of ESRD/hemodialysis; anemia, hypertension/volume and secondary hyperparathyroidism  TML:[id number], No Pcp Per  HPI: Kerry Cook is a 36 y.o. female. 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  Past medical history significant for failed renal transplant x2, severe HTN, anemia of CKD and secondary hyperparathyroidism.  Patient presented to the ED due to hypotension.  States her BP dropped during dialysis and she felt weak, dizzy and short of breath post treatment so she came to the ED.  Reports she wore the clonidine patch and took all her BP medications prior to HD today, which she does not typically do.   Review of out patient record shows normal BP when arriving at unit (122/78) with post BP 105/59.  BP typically 200/100 pre and post.  Of note patient gained 7L over the weekend and only had 1L removed with HD today due to hypotension.  She signed off 1mn early and left HD 8.4kg over her dry weight.  History of intermittent large gains, signing off early and has not met her dry weight recently.  Asked patient about large gain and reports she "over did it" this weekend.  Currently admits to shortness of breath and fatigue.  Denies CP, weakness, abdominal pain, n/v/d, fever and chills.   Pertinent findings in the ED include hypotension, mild tachypnea, hypoxia, K 5.3, CXR with marked cardiomegaly w/vascular congestion but no definite overt pulmonary edema and patchy retrocardiac opacities. Patient has been admitted for further evaluation and management.   Past Medical History:  Diagnosis Date   [place]    started HD in 2010, has had kidney transplant x 2. 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 and is back on HD MWF   History of kidney transplant    1st transplant was 2012- 2015, 2nd transplant was 2018- 2022 approx   Hypertension    Multiple gastric ulcers    Pneumonia 01/23/2015   Past Surgical  History:  Procedure Laterality Date   ESOPHAGOGASTRODUODENOSCOPY N/A 04/01/2016   Procedure: ESOPHAGOGASTRODUODENOSCOPY (EGD);  Surgeon: CGatha Mayer MD;  [facility];  Service: Endoscopy;  Laterality: N/A;   ESOPHAGOGASTRODUODENOSCOPY (EGD) WITH PROPOFOL N/A 11/20/2016   Procedure: ESOPHAGOGASTRODUODENOSCOPY (EGD) WITH PROPOFOL;  Surgeon: DDoran Stabler MD;  [facility]  Service: Endoscopy;  Laterality: N/A;   KIDNEY TRANSPLANT Bilateral 2012   Family History  Problem Relation Age of Onset   Kidney disease Father    Lupus Maternal Grandmother    Cancer Maternal Grandfather    Colon cancer Neg Hx    Stomach cancer Neg Hx    Rectal cancer Neg Hx    Esophageal cancer Neg Hx    Liver cancer Neg Hx    Social History:  reports that she has never smoked. She has never used smokeless tobacco. She reports that she does not drink alcohol and does not use drugs. Allergies  Allergen Reactions   Tape Itching and Other (See Comments)    Plastic/clear tape tears up the patient's skin   Zosyn [Piperacillin Sod-Tazobactam So] Itching    Has patient had a PCN reaction causing immediate rash, facial/tongue/throat swelling, SOB or lightheadedness with hypotension: No Has patient had a PCN reaction causing severe rash involving mucus membranes or skin necrosis: No Has patient had a PCN reaction that required hospitalization: No Has patient had a PCN reaction occurring within the last 10 years: No If all of the above answers  are "NO", then may proceed with Cephalosporin use.    Prior to Admission medications   Medication Sig Start Date End Date Taking? Authorizing Provider  carvedilol (COREG) 25 MG tablet Take 1 tablet (25 mg total) by mouth every morning. 09/06/21 03/02/22  [place] Donnamarie Poag, DO  cloNIDine (CATAPRES - DOSED IN MG/24 HR) 0.3 mg/24hr patch Place 0.3 mg onto the skin once a week. 01/07/22   [provider]  ferric citrate (AURYXIA) 1 GM 210 MG(Fe) tablet  Take 420 mg by mouth 3 (three) times daily with meals.    [provider]  losartan (COZAAR) 100 MG tablet Take 1 tablet (100 mg total) by mouth daily. 09/06/21 12/05/21  [place] Donnamarie Poag, DO  mirtazapine (REMERON) 15 MG tablet Take 15 mg by mouth at bedtime. 11/01/20 11/01/21  [provider]  NIFEdipine (PROCARDIA XL/NIFEDICAL-XL) 90 MG 24 hr tablet Take 1 tablet (90 mg total) by mouth daily. 09/06/21 12/05/21  [place] Donnamarie Poag, DO  pantoprazole (PROTONIX) 20 MG tablet Take 20 mg by mouth daily. 01/12/22   [provider]  amLODipine (NORVASC) 10 MG tablet Take 1 tablet (10 mg total) by mouth every evening. Patient not taking: No sig reported 09/13/15 12/14/20  [name], MD   Current Facility-Administered Medications  Medication Dose Route Frequency Provider Last Rate Last Admin   0.9 %  sodium chloride infusion  250 mL Intravenous PRN Lequita Halt, MD       acetaminophen (TYLENOL) tablet 650 mg  650 mg Oral Q4H PRN Lequita Halt, MD       ferric citrate (AURYXIA) tablet 420 mg  420 mg Oral TID WC Lequita Halt, MD       [START ON 01/22/2022] heparin injection 5,000 Units  5,000 Units Subcutaneous Q12H Wynetta Fines T, MD       hydrALAZINE (APRESOLINE) tablet 25 mg  25 mg Oral Q6H PRN Lequita Halt, MD       mirtazapine (REMERON) tablet 15 mg  15 mg Oral QHS Wynetta Fines T, MD       ondansetron [facility]) injection 4 mg  4 mg Intravenous Q6H PRN Lequita Halt, MD       [START ON 01/22/2022] pantoprazole (PROTONIX) EC tablet 20 mg  20 mg Oral Daily Wynetta Fines T, MD       sodium chloride flush (NS) 0.9 % injection 3 mL  3 mL Intravenous Q12H Wynetta Fines T, MD       sodium chloride flush (NS) 0.9 % injection 3 mL  3 mL Intravenous PRN Lequita Halt, MD       Current Outpatient Medications  Medication Sig Dispense Refill   carvedilol (COREG) 25 MG tablet Take 1 tablet (25 mg total) by mouth every morning. 30 tablet 2   cloNIDine (CATAPRES - DOSED IN MG/24 HR) 0.3 mg/24hr  patch Place 0.3 mg onto the skin once a week.     ferric citrate (AURYXIA) 1 GM 210 MG(Fe) tablet Take 420 mg by mouth 3 (three) times daily with meals.     losartan (COZAAR) 100 MG tablet Take 1 tablet (100 mg total) by mouth daily. 30 tablet 2   mirtazapine (REMERON) 15 MG tablet Take 15 mg by mouth at bedtime.     NIFEdipine (PROCARDIA XL/NIFEDICAL-XL) 90 MG 24 hr tablet Take 1 tablet (90 mg total) by mouth daily. 30 tablet 2   pantoprazole (PROTONIX) 20 MG tablet Take 20 mg by mouth daily.  Labs: Basic Metabolic Panel: Recent Labs  Lab 01/31/2022 1250 2022-01-31 1302  NA 136 135  K 5.1 5.3*  CL 100 101  CO2 23  --   GLUCOSE 123* 123*  BUN 26* 33*  CREATININE 5.39* 6.00*  CALCIUM 9.4  --    Liver Function Tests: Recent Labs  Lab 01/31/2022 1250  AST 54*  ALT 52*  ALKPHOS 119  BILITOT 1.3*  PROT 6.3*  ALBUMIN 2.6*   CBC: Recent Labs  Lab 01/31/2022 1250 January 31, 2022 1302  WBC 5.2  --   NEUTROABS 3.8  --   HGB 10.6* 11.2*  HCT 33.6* 33.0*  MCV 104.3*  --   PLT 96*  --    Studies/Results: DG Chest 1 View  Result Date: 01/31/2022 CLINICAL DATA:  Hypotension and shortness of breath EXAM: CHEST  1 VIEW COMPARISON:  Chest radiograph 09/05/2018 FINDINGS: Heart is markedly enlarged, similar to the study from 09/04/2021. The upper mediastinal contours are stable. There is vascular congestion without evidence of overt edema. There is patchy retrocardiac opacity with silhouetting of the medial left hemidiaphragm new since the prior study. There is no other focal consolidation. There is no pleural effusion. There is no pneumothorax There is no acute osseous abnormality. IMPRESSION: 1. Marked cardiomegaly with vascular congestion but no definite overt pulmonary edema. 2. Patchy retrocardiac opacities are new since the prior study and could reflect infection in the correct clinical setting. Electronically Signed   By: Valetta Mole M.D.   On: 01/31/2022 12:52    ROS: All others negative  except those listed in HPI.   Physical Exam: Vitals:   01/31/22 1219 2022/01/31 1223 01/31/2022 1415 31-Jan-2022 1417  BP: (!) 127/101 95/69 101/73   Pulse: 76  74 74  Resp: (!) 22  (!) 22 (!) 23  Temp: 98 F (36.7 C)     TempSrc: Oral     SpO2: (!) 88% 90% 99% 95%     General: WDWN female in NAD Head: NCAT sclera not icteric MMM Neck: Supple. No lymphadenopathy Lungs: +crackles, +scattered wheezing, nml WOB on 2L O2 via [place] Heart: RRR. No murmur, rubs or gallops.  Abdomen: soft, nontender, +BS, no guarding, no rebound tenderness Lower extremities:1-2+ edema b/l Neuro: lethargic, oriented x3. Moves all extremities spontaneously. Psych:  Responds to questions appropriately with a normal affect. Dialysis Access: LU AVF +b/t  Dialysis Orders:  MWF- [facility]  3.75hrs, BFR 400, DFR 500,  EDW 58kg, 2K/ 3Ca  Access: LU AVF  Heparin 3500 unit bolus, 2500 unit intermittent  Hectorol 3 mcg IV qHD    Home BP meds: Procardia  XL 41m, labetalol 2062mBID, coreg 2580mID, clonidine path 0.3mg25mAssessment/Plan:  Hypotension - ECHO results pending to r/o tamponade.  Typically non compliant with medications but reports she took all her meds prior to HD this AM. BP meds on hold. Monitor. Volume overload - large weekend gain, counseled on fluid restrictions.  >8kg over dry weight post HD. Plan for extra HD tomorrow if hemodynamically stable for volume removal.  Elevated troponin - suspect demand ischemia. Per PMD Mild hyperkalemia - K 5.3. give 1 dose lokelma when no longer NPO.   ESRD -  on HD MWF.  Extra HD tomorrow for volume removal if able.  Anemia of CKD - Hgb 11.2. No indication for ESA.  Secondary Hyperparathyroidism -  Ca in goal. Check phos. Continue binders and VDRA.  Nutrition - Currently NPO.   LindJen Mow-C [employer]  12-Feb-2022, 3:04 PM  Nephrology attending: I have personally seen and examined patient in ER.  Chart reviewed and I agree with the consult  note above. 36 year old female ESRD on HD, nonadherence with outpatient treatment with very high intradialytic hypotension presented with hypotension and volume overload.  Apparently patient had dialysis today at outpatient when she signed off early.  She had large gain over the weekend.  The initial plan was to do intermittent hemodialysis tomorrow, extra treatment for UF. Noted that patient is currently intubated because of lethargy and plan to get CT scan.  She will go to ICU under critical care team.  We may need to consider CRRT for ultrafiltration if remains hypotensive. She has left upper extremity AV fistula for the access.  Katheran James, [facility].

## 2022-01-22 NOTE — Progress Notes (Deleted)
Pharmacy Antibiotic Note  Kerry Cook is a 36 y.o. female with history of ESRD after failed transplant, HTN, HLD and CHF admitted on Jan 29, 2022 with hypotension at HD center.  Patient arrested x 2 in the ED.  Pharmacy has been consulted for Unasyn dosing for aspiration PNA.  Afebrile, WBC WNL, LA 6.  Plan: Unasyn 3gm IV Q12H Monitor HD schedule/tolerance, clinical progress  Height: 5' (152.4 cm) Weight: 60 kg (132 lb 4.4 oz) IBW/kg (Calculated) : 45.5  Temp (24hrs), Avg:98 F (36.7 C), Min:98 F (36.7 C), Max:98 F (36.7 C)  Recent Labs  Lab 01-29-2022 1250 2022-01-29 1302 01/29/22 1544  WBC 5.2  --   --   CREATININE 5.39* 6.00*  --   LATICACIDVEN  --   --  6.0*    Estimated Creatinine Clearance: 10.6 mL/min (A) (by C-G formula based on SCr of 6 mg/dL (H)).    Allergies  Allergen Reactions   Tape Itching and Other (See Comments)    Plastic/clear tape tears up the patient's skin   Zosyn [Piperacillin Sod-Tazobactam So] Itching    Has patient had a PCN reaction causing immediate rash, facial/tongue/throat swelling, SOB or lightheadedness with hypotension: No Has patient had a PCN reaction causing severe rash involving mucus membranes or skin necrosis: No Has patient had a PCN reaction that required hospitalization: No Has patient had a PCN reaction occurring within the last 10 years: No If all of the above answers are "NO", then may proceed with Cephalosporin use.     Unasyn 7/31 >>   7/31 MRSA PCR - 7/31 BCx -  7/31 TA -   Kerry Cook D. [name], PharmD, BCPS, [place] 01/29/22, 6:59 PM

## 2022-01-22 NOTE — ED Notes (Signed)
Pt placed on cardiac monitor. Sleeping soundly, snoring and in no acute distress

## 2022-01-22 NOTE — Procedures (Signed)
Intraosseous Needle Insertion Procedure Note    Date:Jan 30, 2022  Time:7:04 PM   Provider Performing:Taisa Deloria   Procedure: Insertion Intraosseous (46270)  Indication(s) Medication administration  Consent Unable to obtain consent due to emergent nature of procedure.  Anesthesia Topical only with 1% lidocaine   Timeout Verified patient identification, verified procedure, site/side was marked, verified correct patient position, special equipment/implants available, medications/allergies/relevant history reviewed, required imaging and test results available.  Procedure Description Area of needle insertion was cleaned with chlorhexidine. Intraosseous needle was placed into the left tibia. Bone marrow was aspirated and site easily flushed. The needle was secured in place and dressing applied.  Complications/Tolerance None; patient tolerated the procedure well.  EBL Minimal

## 2022-01-22 NOTE — ED Notes (Signed)
Planning to get to 42M and will Bronch on unit

## 2022-01-22 NOTE — Procedures (Signed)
Intubation Procedure Note  Kerry Cook  [id number]  1986-02-25  Date:01-25-22  Time:6:03 PM   Provider Performing:Emmanual Gauthreaux    Procedure: Intubation (31500)  Indication(s) Respiratory Failure  Consent Unable to obtain consent due to emergent nature of procedure.   Anesthesia Etomidate and Rocuronium   Time Out Verified patient identification, verified procedure, site/side was marked, verified correct patient position, special equipment/implants available, medications/allergies/relevant history reviewed, required imaging and test results available.   Sterile Technique Usual hand hygeine, masks, and gloves were used   Procedure Description Patient positioned in bed supine.  Sedation given as noted above.  Patient was intubated with endotracheal tube using  MAC 4 .  View was Grade 1 full glottis .  Number of attempts was 1.  Colorimetric CO2 detector was consistent with tracheal placement.   Complications/Tolerance None; patient tolerated the procedure well. Chest X-ray is ordered to verify placement.   EBL Minimal   Specimen(s) None

## 2022-01-22 DEATH — deceased

## 2022-02-04 MED FILL — Medication: Qty: 1 | Status: AC

## 2022-02-22 NOTE — Death Summary Note (Signed)
DEATH SUMMARY   Patient Details  Name: Kerry Cook MRN: [id number] DOB: [birth year]  Admission/Discharge Information   Admit Date:  01/22/22  Date of Death: Date of Death: 01-22-22  Time of Death: Time of Death: 1945-09-08  Length of Stay: 1  Referring Physician: Patient, No Pcp Per   Reason(s) for Hospitalization  S/p PEA cardiac arrest x 2, likely related to hypoxia Post arrest encephalopathy Acute hypoxic/hypercapnic respiratory failure Aspiration pneumonitis versus pneumonia Septic shock with probably combination of cardiogenic in the setting of stunned myocardium postarrest End-stage renal disease on hemodialysis Recurrent hypoglycemia Lactic acidosis  Diagnoses  Preliminary cause of death:   Withdrawal of care in the setting of progressive multiorgan failure Secondary Diagnoses (including complications and co-morbidities):  Principal Problem:   CHF (congestive heart failure) ([place]) Active Problems:   Hypotension   Acute encephalopathy   Brief Hospital Course (including significant findings, care, treatment, and services provided and events leading to death)  Kerry Cook is a 36 y.o. year old female who 36 yo F PMH ESRD, failed kidney transplant x2, HFpEF, HTN, Pericardial effusion presented to ED Jan 23, 2023 from dialysis after being noted to be hypotensive at HD. Did not complete HD run.    Initially admitted to [facility] -- c/o lightheadedness and sleepiness.  While still in ED, pt became acutely less responsive. PCCM was called emergently for possible advanced airway    Patient became unresponsive and agonal after our arrival.  Required ACLS for PEA arrest x 2, Advanced airway   During first code, had significant gastric emesis & aspiration   Post cardiac arrest patient remained hypotensive, requiring increasing vasopressors, unable to maintain map goal 65, she will remain on maximum ventilatory support, she was admitted to ICU, bronchoscopy was done which showed  residual of vomitus which was aspirated, she remained severely acidotic, CT head was done which was negative for acute findings, CT chest confirmed bilateral aspiration pneumonia, CT abdomen pelvis was consistent with a gastric distention due to bag-valve-mask ventilation but no obstruction, patient lactate remained elevated to more than 9.  With progressive multiorgan failure, refractory vasoplegic shock, goals of care discussions were carried with family, they decided to patient DNR proceed with comfort care considering she is in multiorgan failure with minimal chance of recovery.  DNR orders were written patient passed peacefully 2022/01/22 at 7:47 PM.  Patient's family was at bedside    Pertinent Labs and Studies  Significant Diagnostic Studies CT CHEST ABDOMEN PELVIS WO CONTRAST  Result Date: 22-Jan-2022 CLINICAL DATA:  Sepsis, vomiting, weakness. EXAM: CT CHEST, ABDOMEN AND PELVIS WITHOUT CONTRAST TECHNIQUE: Multidetector CT imaging of the chest, abdomen and pelvis was performed following the standard protocol without IV contrast. RADIATION DOSE REDUCTION: This exam was performed according to the departmental dose-optimization program which includes automated exposure control, adjustment of the mA and/or kV according to patient size and/or use of iterative reconstruction technique. COMPARISON:  August 20, 2021. FINDINGS: CT CHEST FINDINGS Cardiovascular: Mild cardiomegaly is noted. Small pericardial effusion is noted. Atherosclerosis of thoracic aorta is noted without aneurysm formation. Moderate coronary artery calcifications are noted. Mediastinum/Nodes: Thyroid gland is unremarkable. Endotracheal tube is in grossly good position. No adenopathy is noted. Nasogastric tube tip is seen in proximal esophagus. Lungs/Pleura: No pneumothorax is noted. No significant pleural effusion is noted. Bilateral lower lobe airspace opacities are noted concerning for pneumonia or atelectasis. Musculoskeletal: No chest  wall mass or suspicious bone lesions identified. CT ABDOMEN PELVIS FINDINGS Hepatobiliary: No focal liver abnormality is seen.  No gallstones, gallbladder wall thickening, or biliary dilatation. Pancreas: Unremarkable. No pancreatic ductal dilatation or surrounding inflammatory changes. Spleen: Normal in size without focal abnormality. Adrenals/Urinary Tract: Adrenal glands appear normal. Severe bilateral renal atrophy is noted. No hydronephrosis or renal obstruction is noted. Urinary bladder is unremarkable. Stomach/Bowel: Moderate gastric distention is noted. No abnormal large or small bowel dilatation is noted. Vascular/Lymphatic: Aortic atherosclerosis. No enlarged abdominal or pelvic lymph nodes. Reproductive: Uterus and bilateral adnexa are unremarkable. 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 Mild ascites is noted. No hernia is noted. Mild anasarca is noted. Musculoskeletal: No acute or significant osseous findings. IMPRESSION: Bilateral lower lobe airspace opacities are noted concerning for pneumonia or atelectasis. Endotracheal tube in grossly good position. Nasogastric tube tip seen in proximal esophagus. Moderate coronary artery calcifications are noted. Severe bilateral renal atrophy is noted consistent with end-stage renal disease. 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 Moderate gastric distention is noted. No large or small bowel dilatation is noted. Mild ascites and anasarca is noted. Aortic Atherosclerosis (ICD10-I70.0). Electronically Signed   By: Marijo Conception M.D.   On: 02/07/22 18:04   CT HEAD WO CONTRAST (5MM)  Result Date: February 07, 2022 CLINICAL DATA:  Delirium dizziness EXAM: CT HEAD WITHOUT CONTRAST TECHNIQUE: Contiguous axial images were obtained from the base of the skull through the vertex without intravenous contrast. RADIATION DOSE REDUCTION: This exam was performed according to the departmental dose-optimization program which  includes automated exposure control, adjustment of the mA and/or kV according to patient size and/or use of iterative reconstruction technique. COMPARISON:  None Available. FINDINGS: Brain: No acute intracranial hemorrhage. No focal mass lesion. No CT evidence of acute infarction. No midline shift or mass effect. No hydrocephalus. Basilar cisterns are patent. Vascular: No hyperdense vessel or unexpected calcification. Skull: Normal. Negative for fracture or focal lesion. Sinuses/Orbits: Fluid level in the sphenoid sinuses. Opacification of the ethmoid air cells. Frontal sinuses clear. Other: None. IMPRESSION: 1. Sinusitis of the sphenoid sinuses. 2. No acute intracranial findings. Electronically Signed   By: Suzy Bouchard M.D.   On: Feb 07, 2022 17:54   Portable Chest x-ray  Addendum Date: 02/07/22   ADDENDUM REPORT: 2022/02/07 17:11 ADDENDUM: These results were called by telephone at the time of interpretation on 07-Feb-2022 at 5:05 pm to provider Dr. Regenia Skeeter, who verbally acknowledged these results. Electronically Signed   By: Ronney Asters M.D.   On: Feb 07, 2022 17:11   Result Date: 02/07/22 CLINICAL DATA:  Generalized weakness and hypotension. EXAM: PORTABLE CHEST 1 VIEW COMPARISON:  Chest x-ray 2022/02/07 FINDINGS: There is right mainstem intubation. Enteric tube tip is at the level of the distal esophagus. Cardiac silhouette is markedly enlarged, unchanged. There is some airspace opacities in the left upper lobe. There is no pleural effusion or pneumothorax. No acute fractures are seen. IMPRESSION: 1. Right mainstem intubation.  Recommend retraction 4.5 cm. 2. Enteric tube tip at the level of the distal esophagus. 3. Patchy left upper lobe airspace disease. 4. Stable marked cardiomegaly. Electronically Signed: By: Ronney Asters M.D. On: Feb 07, 2022 17:03   ECHOCARDIOGRAM LIMITED  Result Date: Feb 07, 2022    ECHOCARDIOGRAM LIMITED REPORT   Patient Name:   Kerry Cook [place] Date of Exam: 07-Feb-2022  Medical Rec #:  [id number]          Height:       60.0 in Accession #:    [id number]         Weight:       121.5 lb Date  of Birth:  June 25, 1985           BSA:          1.510 m Patient Age:    36 years           BP:           84/68 mmHg Patient Gender: F                  HR:           73 bpm. Exam Location:  Inpatient Procedure: 2D Echo, Color Doppler, Cardiac Doppler and Strain Analysis STAT ECHO Indications:    I31.3 Pericardial effusion  History:        Patient has prior history of Echocardiogram examinations, most                 recent 09/03/2021. CHF, ESRD; Risk Factors:Hypertension.  Sonographer:    Raquel Sarna Senior RDCS Referring Phys: [id number] [place]  1. There is severe concentric left ventricular hypertrophy with abnormal strain that pattern that may suggest infiltrative process. Recommend CMR to assess for HCM or amyloidosis given degree of LVH and abnormal strain pattern.  2. Left ventricular ejection fraction, by estimation, is 35 to 40%. The left ventricle has moderately decreased function. The left ventricle demonstrates global hypokinesis. There is severe concentric left ventricular hypertrophy. The average left ventricular global longitudinal strain is -6.0 %. The global longitudinal strain is abnormal.  3. Right ventricular systolic function is moderately reduced. The right ventricular size is normal. Mildly increased right ventricular wall thickness. There is normal pulmonary artery systolic pressure.  4. A small pericardial effusion is present. The pericardial effusion is circumferential. There is no evidence of cardiac tamponade.  5. The mitral valve is grossly normal. Trivial mitral valve regurgitation.  6. The aortic valve is tricuspid. There is mild thickening of the aortic valve.  7. Evidence of atrial level shunting detected by color flow Doppler.  8. Comparison(s): Compared to prior TTE in 08/2021, the LVEF has dropped to 35-40% with strain dropping from 22% to 6%. Recommend CMR  for further evaluation as detailed above. FINDINGS  Left Ventricle: Left ventricular ejection fraction, by estimation, is 35 to 40%. The left ventricle has moderately decreased function. The left ventricle demonstrates global hypokinesis. The average left ventricular global longitudinal strain is -6.0 %.  The global longitudinal strain is abnormal. There is severe concentric left ventricular hypertrophy. Right Ventricle: The right ventricular size is normal. Mildly increased right ventricular wall thickness. Right ventricular systolic function is moderately reduced. There is normal pulmonary artery systolic pressure. The tricuspid regurgitant velocity is  1.70 m/s, and with an assumed right atrial pressure of 15 mmHg, the estimated right ventricular systolic pressure is 67.5 mmHg. Pericardium: A small pericardial effusion is present. The pericardial effusion is circumferential. There is no evidence of cardiac tamponade. Mitral Valve: The mitral valve is grossly normal. There is mild thickening of the mitral valve leaflet(s). Trivial mitral valve regurgitation. Tricuspid Valve: The tricuspid valve is normal in structure. Tricuspid valve regurgitation is moderate. Aortic Valve: The aortic valve is tricuspid. There is mild thickening of the aortic valve. Pulmonic Valve: The pulmonic valve was normal in structure. IAS/Shunts: Evidence of atrial level shunting detected by color flow Doppler.  LV Volumes (MOD) LV vol d, MOD A2C: 114.0 ml LV vol d, MOD A4C: 93.8 ml  2D Longitudinal Strain LV vol s, MOD A2C: 63.3 ml  2D Strain GLS Avg:     -  6.0 % LV vol s, MOD A4C: 51.2 ml LV SV MOD A2C:     50.7 ml LV SV MOD A4C:     93.8 ml LV SV MOD BP:      47.6 ml RIGHT VENTRICLE RV S prime:     4.90 cm/s TAPSE (M-mode): 1.2 cm AORTIC VALVE LVOT Vmax:   95.90 cm/s LVOT Vmean:  65.700 cm/s LVOT VTI:    0.145 m TRICUSPID VALVE TR Peak grad:   11.6 mmHg TR Vmax:        170.00 cm/s  SHUNTS Systemic VTI: 0.14 m Gwyndolyn Kaufman MD  Electronically signed by Gwyndolyn Kaufman MD Signature Date/Time: Feb 10, 2022/3:25:21 PM    Final    DG Chest 1 View  Result Date: Feb 10, 2022 CLINICAL DATA:  Hypotension and shortness of breath EXAM: CHEST  1 VIEW COMPARISON:  Chest radiograph 09/05/2018 FINDINGS: Heart is markedly enlarged, similar to the study from 09/04/2021. The upper mediastinal contours are stable. There is vascular congestion without evidence of overt edema. There is patchy retrocardiac opacity with silhouetting of the medial left hemidiaphragm new since the prior study. There is no other focal consolidation. There is no pleural effusion. There is no pneumothorax There is no acute osseous abnormality. IMPRESSION: 1. Marked cardiomegaly with vascular congestion but no definite overt pulmonary edema. 2. Patchy retrocardiac opacities are new since the prior study and could reflect infection in the correct clinical setting. Electronically Signed   By: Valetta Mole M.D.   On: 02/10/2022 12:52    Microbiology Recent Results (from the past 240 hour(s))  MRSA Next Gen by PCR, Nasal     Status: None   Collection Time: 2022/02/10  6:02 PM   Specimen: Nasal Mucosa; Nasal Swab  Result Value Ref Range Status   MRSA by PCR Next Gen NOT DETECTED NOT DETECTED Final    Comment: (NOTE) The GeneXpert MRSA Assay (FDA approved for NASAL specimens only), is one component of a comprehensive MRSA colonization surveillance program. It is not intended to diagnose MRSA infection nor to guide or monitor treatment for MRSA infections. Test performance is not FDA approved in patients less than 84 years old. Performed at [facility], [place] [address] [place], [place] [address]     Lab Basic Metabolic Panel: Recent Labs  Lab 02-10-2022 1250 02-10-22 1302 02/10/22 1837 February 10, 2022 1840  NA 136 135 136 133*  K 5.1 5.3* 6.6* 6.4*  CL 100 101 98  --   CO2 23  --  22  --   GLUCOSE 123* 123* 246*  --   BUN 26* 33* 32*  --   CREATININE 5.39*  6.00* 6.12*  --   CALCIUM 9.4  --  9.7  --    Liver Function Tests: Recent Labs  Lab February 10, 2022 1250 02/10/2022 1837  AST 54* 97*  ALT 52* 67*  ALKPHOS 119 142*  BILITOT 1.3* 2.6*  PROT 6.3* 6.7  ALBUMIN 2.6* 2.7*   No results for input(s): "LIPASE", "AMYLASE" in the last 168 hours. No results for input(s): "AMMONIA" in the last 168 hours. CBC: Recent Labs  Lab 02-10-22 1250 02/10/22 1302 02-10-22 1837 2022/02/10 1840  WBC 5.2  --  7.9  --   NEUTROABS 3.8  --   --   --   HGB 10.6* 11.2* 10.9* 12.2  HCT 33.6* 33.0* 35.1* 36.0  MCV 104.3*  --  104.5*  --   PLT 96*  --  PLATELET CLUMPS NOTED ON SMEAR, COUNT APPEARS DECREASED  --  Cardiac Enzymes: No results for input(s): "CKTOTAL", "CKMB", "CKMBINDEX", "TROPONINI" in the last 168 hours. Sepsis Labs: Recent Labs  Lab 01-Feb-2022 1250 02/01/22 1544 01-Feb-2022 1758 2022/02/01 1837  WBC 5.2  --   --  7.9  LATICACIDVEN  --  6.0* >9.0*  --     Procedures/Operations     Jeffory Snelgrove 01/22/2022, 2:10 PM
